# Patient Record
Sex: Female | Born: 1955 | Race: Black or African American | Hispanic: No | Smoking: Never smoker
Health system: Southern US, Community
[De-identification: ages and names within clinical notes are randomized; demographics above are authoritative.]

## PROBLEM LIST (undated history)

## (undated) DIAGNOSIS — F32A Depression, unspecified: Secondary | ICD-10-CM

## (undated) DIAGNOSIS — I878 Other specified disorders of veins: Secondary | ICD-10-CM

## (undated) DIAGNOSIS — F329 Major depressive disorder, single episode, unspecified: Secondary | ICD-10-CM

## (undated) DIAGNOSIS — N182 Chronic kidney disease, stage 2 (mild): Secondary | ICD-10-CM

## (undated) DIAGNOSIS — Z5189 Encounter for other specified aftercare: Secondary | ICD-10-CM

## (undated) DIAGNOSIS — K5731 Diverticulosis of large intestine without perforation or abscess with bleeding: Secondary | ICD-10-CM

## (undated) DIAGNOSIS — I639 Cerebral infarction, unspecified: Secondary | ICD-10-CM

## (undated) DIAGNOSIS — I739 Peripheral vascular disease, unspecified: Secondary | ICD-10-CM

## (undated) DIAGNOSIS — K589 Irritable bowel syndrome without diarrhea: Secondary | ICD-10-CM

## (undated) DIAGNOSIS — D649 Anemia, unspecified: Secondary | ICD-10-CM

## (undated) DIAGNOSIS — K2970 Gastritis, unspecified, without bleeding: Secondary | ICD-10-CM

## (undated) DIAGNOSIS — Z860100 Personal history of colon polyps, unspecified: Secondary | ICD-10-CM

## (undated) DIAGNOSIS — Z8719 Personal history of other diseases of the digestive system: Secondary | ICD-10-CM

## (undated) DIAGNOSIS — B9681 Helicobacter pylori [H. pylori] as the cause of diseases classified elsewhere: Secondary | ICD-10-CM

## (undated) DIAGNOSIS — D509 Iron deficiency anemia, unspecified: Secondary | ICD-10-CM

## (undated) DIAGNOSIS — I10 Essential (primary) hypertension: Secondary | ICD-10-CM

## (undated) DIAGNOSIS — E119 Type 2 diabetes mellitus without complications: Secondary | ICD-10-CM

## (undated) DIAGNOSIS — Z87440 Personal history of urinary (tract) infections: Secondary | ICD-10-CM

## (undated) DIAGNOSIS — I2510 Atherosclerotic heart disease of native coronary artery without angina pectoris: Secondary | ICD-10-CM

## (undated) DIAGNOSIS — I509 Heart failure, unspecified: Secondary | ICD-10-CM

## (undated) HISTORY — DX: Essential (primary) hypertension: I10

## (undated) HISTORY — PX: NEPHRECTOMY: SHX65

## (undated) HISTORY — PX: TUBAL LIGATION: SHX77

## (undated) HISTORY — PX: KNEE ARTHROSCOPY: SHX127

## (undated) HISTORY — PX: ABDOMINAL HYSTERECTOMY: SHX81

## (undated) HISTORY — PX: ABDOMINAL HYSTERECTOMY: SUR658

## (undated) HISTORY — DX: Anemia, unspecified: D64.9

## (undated) HISTORY — DX: Irritable bowel syndrome without diarrhea: K58.9

## (undated) HISTORY — PX: CHOLECYSTECTOMY: SHX55

## (undated) HISTORY — DX: Personal history of colonic polyps: Z86.010

## (undated) HISTORY — DX: Helicobacter pylori (H. pylori) as the cause of diseases classified elsewhere: B96.81

## (undated) HISTORY — DX: Cerebral infarction, unspecified: I63.9

## (undated) HISTORY — PX: HAND SURGERY: SHX662

## (undated) HISTORY — DX: Encounter for other specified aftercare: Z51.89

## (undated) HISTORY — DX: Personal history of colon polyps, unspecified: Z86.0100

## (undated) HISTORY — DX: Iron deficiency anemia, unspecified: D50.9

## (undated) HISTORY — DX: Diverticulosis of large intestine without perforation or abscess with bleeding: K57.31

## (undated) HISTORY — DX: Gastritis, unspecified, without bleeding: K29.70

---

## 1978-11-14 HISTORY — PX: TUBAL LIGATION: SHX77

## 1998-11-18 ENCOUNTER — Other Ambulatory Visit: Admission: RE | Admit: 1998-11-18 | Discharge: 1998-11-18 | Payer: Self-pay | Admitting: Obstetrics & Gynecology

## 1999-07-12 ENCOUNTER — Encounter (INDEPENDENT_AMBULATORY_CARE_PROVIDER_SITE_OTHER): Payer: Self-pay | Admitting: Specialist

## 1999-07-12 ENCOUNTER — Other Ambulatory Visit: Admission: RE | Admit: 1999-07-12 | Discharge: 1999-07-12 | Payer: Self-pay | Admitting: Obstetrics and Gynecology

## 1999-07-30 ENCOUNTER — Encounter: Payer: Self-pay | Admitting: Obstetrics and Gynecology

## 1999-08-04 ENCOUNTER — Inpatient Hospital Stay (HOSPITAL_COMMUNITY): Admission: RE | Admit: 1999-08-04 | Discharge: 1999-08-07 | Payer: Self-pay | Admitting: Obstetrics and Gynecology

## 1999-08-04 ENCOUNTER — Encounter (INDEPENDENT_AMBULATORY_CARE_PROVIDER_SITE_OTHER): Payer: Self-pay | Admitting: Specialist

## 1999-08-17 ENCOUNTER — Encounter: Payer: Self-pay | Admitting: Obstetrics and Gynecology

## 1999-08-17 ENCOUNTER — Ambulatory Visit (HOSPITAL_COMMUNITY): Admission: RE | Admit: 1999-08-17 | Discharge: 1999-08-17 | Payer: Self-pay | Admitting: Obstetrics and Gynecology

## 2000-02-18 ENCOUNTER — Other Ambulatory Visit: Admission: RE | Admit: 2000-02-18 | Discharge: 2000-02-18 | Payer: Self-pay | Admitting: Obstetrics and Gynecology

## 2001-08-30 ENCOUNTER — Encounter: Admission: RE | Admit: 2001-08-30 | Discharge: 2001-10-26 | Payer: Self-pay | Admitting: Orthopedic Surgery

## 2002-05-09 ENCOUNTER — Other Ambulatory Visit: Admission: RE | Admit: 2002-05-09 | Discharge: 2002-05-09 | Payer: Self-pay | Admitting: Obstetrics and Gynecology

## 2002-10-14 ENCOUNTER — Ambulatory Visit (HOSPITAL_COMMUNITY): Admission: RE | Admit: 2002-10-14 | Discharge: 2002-10-14 | Payer: Self-pay | Admitting: Internal Medicine

## 2002-10-14 ENCOUNTER — Encounter: Payer: Self-pay | Admitting: Internal Medicine

## 2002-10-24 ENCOUNTER — Ambulatory Visit (HOSPITAL_COMMUNITY): Admission: RE | Admit: 2002-10-24 | Discharge: 2002-10-24 | Payer: Self-pay | Admitting: Internal Medicine

## 2002-10-24 ENCOUNTER — Encounter: Payer: Self-pay | Admitting: Internal Medicine

## 2002-11-14 HISTORY — PX: ABDOMINAL HYSTERECTOMY: SHX81

## 2002-12-12 ENCOUNTER — Encounter: Payer: Self-pay | Admitting: Urology

## 2002-12-16 ENCOUNTER — Encounter (INDEPENDENT_AMBULATORY_CARE_PROVIDER_SITE_OTHER): Payer: Self-pay

## 2002-12-16 ENCOUNTER — Inpatient Hospital Stay (HOSPITAL_COMMUNITY): Admission: RE | Admit: 2002-12-16 | Discharge: 2002-12-19 | Payer: Self-pay | Admitting: Urology

## 2003-01-17 ENCOUNTER — Encounter: Payer: Self-pay | Admitting: Internal Medicine

## 2003-01-20 ENCOUNTER — Encounter: Payer: Self-pay | Admitting: Internal Medicine

## 2003-01-20 ENCOUNTER — Ambulatory Visit (HOSPITAL_COMMUNITY): Admission: RE | Admit: 2003-01-20 | Discharge: 2003-01-20 | Payer: Self-pay | Admitting: Internal Medicine

## 2003-05-12 ENCOUNTER — Other Ambulatory Visit: Admission: RE | Admit: 2003-05-12 | Discharge: 2003-05-12 | Payer: Self-pay | Admitting: Obstetrics and Gynecology

## 2004-01-02 ENCOUNTER — Ambulatory Visit (HOSPITAL_COMMUNITY): Admission: RE | Admit: 2004-01-02 | Discharge: 2004-01-02 | Payer: Self-pay | Admitting: Urology

## 2004-07-15 ENCOUNTER — Ambulatory Visit (HOSPITAL_COMMUNITY): Admission: RE | Admit: 2004-07-15 | Discharge: 2004-07-15 | Payer: Self-pay | Admitting: Urology

## 2004-07-16 ENCOUNTER — Ambulatory Visit (HOSPITAL_BASED_OUTPATIENT_CLINIC_OR_DEPARTMENT_OTHER): Admission: RE | Admit: 2004-07-16 | Discharge: 2004-07-16 | Payer: Self-pay | Admitting: Orthopedic Surgery

## 2004-07-16 ENCOUNTER — Ambulatory Visit (HOSPITAL_COMMUNITY): Admission: RE | Admit: 2004-07-16 | Discharge: 2004-07-16 | Payer: Self-pay | Admitting: Orthopedic Surgery

## 2004-11-22 ENCOUNTER — Emergency Department (HOSPITAL_COMMUNITY): Admission: EM | Admit: 2004-11-22 | Discharge: 2004-11-22 | Payer: Self-pay | Admitting: Emergency Medicine

## 2004-12-14 ENCOUNTER — Encounter: Admission: RE | Admit: 2004-12-14 | Discharge: 2005-03-14 | Payer: Self-pay | Admitting: Family Medicine

## 2005-01-14 ENCOUNTER — Encounter: Admission: RE | Admit: 2005-01-14 | Discharge: 2005-01-14 | Payer: Self-pay | Admitting: Urology

## 2005-08-05 ENCOUNTER — Ambulatory Visit (HOSPITAL_COMMUNITY): Admission: RE | Admit: 2005-08-05 | Discharge: 2005-08-05 | Payer: Self-pay | Admitting: Urology

## 2005-11-06 ENCOUNTER — Observation Stay (HOSPITAL_COMMUNITY): Admission: EM | Admit: 2005-11-06 | Discharge: 2005-11-06 | Payer: Self-pay | Admitting: Emergency Medicine

## 2005-12-15 ENCOUNTER — Inpatient Hospital Stay (HOSPITAL_COMMUNITY): Admission: RE | Admit: 2005-12-15 | Discharge: 2005-12-17 | Payer: Self-pay | Admitting: Surgery

## 2005-12-15 ENCOUNTER — Encounter (INDEPENDENT_AMBULATORY_CARE_PROVIDER_SITE_OTHER): Payer: Self-pay | Admitting: Specialist

## 2006-02-27 ENCOUNTER — Ambulatory Visit: Payer: Self-pay | Admitting: Internal Medicine

## 2006-03-06 ENCOUNTER — Ambulatory Visit: Payer: Self-pay | Admitting: Internal Medicine

## 2006-03-13 ENCOUNTER — Ambulatory Visit: Payer: Self-pay | Admitting: Internal Medicine

## 2006-03-24 ENCOUNTER — Encounter: Payer: Self-pay | Admitting: Internal Medicine

## 2006-05-09 ENCOUNTER — Ambulatory Visit: Payer: Self-pay | Admitting: Internal Medicine

## 2006-06-29 ENCOUNTER — Ambulatory Visit (HOSPITAL_BASED_OUTPATIENT_CLINIC_OR_DEPARTMENT_OTHER): Admission: RE | Admit: 2006-06-29 | Discharge: 2006-06-30 | Payer: Self-pay | Admitting: Orthopedic Surgery

## 2007-05-24 ENCOUNTER — Emergency Department (HOSPITAL_COMMUNITY): Admission: EM | Admit: 2007-05-24 | Discharge: 2007-05-24 | Payer: Self-pay | Admitting: Emergency Medicine

## 2007-05-25 ENCOUNTER — Ambulatory Visit (HOSPITAL_COMMUNITY): Admission: RE | Admit: 2007-05-25 | Discharge: 2007-05-25 | Payer: Self-pay | Admitting: Urology

## 2007-07-03 ENCOUNTER — Ambulatory Visit (HOSPITAL_BASED_OUTPATIENT_CLINIC_OR_DEPARTMENT_OTHER): Admission: RE | Admit: 2007-07-03 | Discharge: 2007-07-03 | Payer: Self-pay | Admitting: Orthopedic Surgery

## 2007-12-28 ENCOUNTER — Ambulatory Visit (HOSPITAL_BASED_OUTPATIENT_CLINIC_OR_DEPARTMENT_OTHER): Admission: RE | Admit: 2007-12-28 | Discharge: 2007-12-28 | Payer: Self-pay | Admitting: Orthopedic Surgery

## 2008-05-16 ENCOUNTER — Ambulatory Visit (HOSPITAL_COMMUNITY): Admission: RE | Admit: 2008-05-16 | Discharge: 2008-05-16 | Payer: Self-pay | Admitting: Urology

## 2008-09-26 ENCOUNTER — Ambulatory Visit (HOSPITAL_COMMUNITY): Admission: RE | Admit: 2008-09-26 | Discharge: 2008-09-26 | Payer: Self-pay | Admitting: Family Medicine

## 2008-10-03 ENCOUNTER — Emergency Department (HOSPITAL_COMMUNITY): Admission: EM | Admit: 2008-10-03 | Discharge: 2008-10-03 | Payer: Self-pay | Admitting: Emergency Medicine

## 2008-11-14 DIAGNOSIS — Z5189 Encounter for other specified aftercare: Secondary | ICD-10-CM

## 2008-11-14 DIAGNOSIS — IMO0001 Reserved for inherently not codable concepts without codable children: Secondary | ICD-10-CM

## 2008-11-14 HISTORY — DX: Encounter for other specified aftercare: Z51.89

## 2008-11-14 HISTORY — DX: Reserved for inherently not codable concepts without codable children: IMO0001

## 2008-11-30 ENCOUNTER — Ambulatory Visit: Payer: Self-pay | Admitting: Gastroenterology

## 2008-12-01 ENCOUNTER — Inpatient Hospital Stay (HOSPITAL_COMMUNITY): Admission: EM | Admit: 2008-12-01 | Discharge: 2008-12-05 | Payer: Self-pay | Admitting: Emergency Medicine

## 2008-12-01 ENCOUNTER — Telehealth: Payer: Self-pay | Admitting: Internal Medicine

## 2008-12-01 ENCOUNTER — Encounter: Payer: Self-pay | Admitting: Gastroenterology

## 2008-12-04 ENCOUNTER — Encounter: Payer: Self-pay | Admitting: Internal Medicine

## 2008-12-07 ENCOUNTER — Encounter: Payer: Self-pay | Admitting: Internal Medicine

## 2008-12-12 ENCOUNTER — Ambulatory Visit: Payer: Self-pay | Admitting: Gastroenterology

## 2008-12-12 ENCOUNTER — Ambulatory Visit: Payer: Self-pay | Admitting: Internal Medicine

## 2008-12-12 DIAGNOSIS — Z86010 Personal history of colonic polyps: Secondary | ICD-10-CM | POA: Insufficient documentation

## 2008-12-12 HISTORY — PX: UPPER GASTROINTESTINAL ENDOSCOPY: SHX188

## 2008-12-12 HISTORY — PX: FLEXIBLE SIGMOIDOSCOPY: SHX1649

## 2008-12-12 LAB — CONVERTED CEMR LAB
ALT: 20 units/L (ref 0–35)
Alkaline Phosphatase: 71 units/L (ref 39–117)
Basophils Absolute: 0 10*3/uL (ref 0.0–0.1)
Creatinine, Ser: 1.1 mg/dL (ref 0.4–1.2)
Eosinophils Absolute: 0.2 10*3/uL (ref 0.0–0.7)
HCT: 25.1 % — ABNORMAL LOW (ref 36.0–46.0)
Hemoglobin: 8.2 g/dL — ABNORMAL LOW (ref 12.0–15.0)
MCHC: 32.8 g/dL (ref 30.0–36.0)
MCV: 90.5 fL (ref 78.0–100.0)
Monocytes Absolute: 0.3 10*3/uL (ref 0.1–1.0)
Neutro Abs: 10.7 10*3/uL — ABNORMAL HIGH (ref 1.4–7.7)
RDW: 15.6 % — ABNORMAL HIGH (ref 11.5–14.6)
Sodium: 142 meq/L (ref 135–145)
Total Bilirubin: 0.5 mg/dL (ref 0.3–1.2)
Total Protein: 6.8 g/dL (ref 6.0–8.3)

## 2008-12-15 ENCOUNTER — Inpatient Hospital Stay (HOSPITAL_COMMUNITY): Admission: EM | Admit: 2008-12-15 | Discharge: 2008-12-23 | Payer: Self-pay | Admitting: Emergency Medicine

## 2008-12-15 ENCOUNTER — Telehealth: Payer: Self-pay | Admitting: Gastroenterology

## 2008-12-16 ENCOUNTER — Encounter: Payer: Self-pay | Admitting: Gastroenterology

## 2008-12-17 ENCOUNTER — Encounter: Payer: Self-pay | Admitting: Gastroenterology

## 2008-12-17 ENCOUNTER — Encounter (INDEPENDENT_AMBULATORY_CARE_PROVIDER_SITE_OTHER): Payer: Self-pay | Admitting: *Deleted

## 2008-12-19 ENCOUNTER — Encounter: Payer: Self-pay | Admitting: Gastroenterology

## 2008-12-19 HISTORY — PX: COLONOSCOPY: SHX5424

## 2008-12-22 ENCOUNTER — Ambulatory Visit: Payer: Self-pay | Admitting: Gastroenterology

## 2008-12-30 ENCOUNTER — Ambulatory Visit: Payer: Self-pay | Admitting: Gastroenterology

## 2008-12-30 ENCOUNTER — Ambulatory Visit: Payer: Self-pay | Admitting: Internal Medicine

## 2008-12-30 LAB — CONVERTED CEMR LAB
HCT: 34.1 % — ABNORMAL LOW (ref 36.0–46.0)
Hemoglobin: 11.5 g/dL — ABNORMAL LOW (ref 12.0–15.0)

## 2009-04-03 ENCOUNTER — Ambulatory Visit (HOSPITAL_BASED_OUTPATIENT_CLINIC_OR_DEPARTMENT_OTHER): Admission: RE | Admit: 2009-04-03 | Discharge: 2009-04-03 | Payer: Self-pay | Admitting: Orthopedic Surgery

## 2009-06-22 ENCOUNTER — Telehealth: Payer: Self-pay | Admitting: Internal Medicine

## 2009-06-23 ENCOUNTER — Ambulatory Visit: Payer: Self-pay | Admitting: Internal Medicine

## 2009-06-23 DIAGNOSIS — I152 Hypertension secondary to endocrine disorders: Secondary | ICD-10-CM | POA: Insufficient documentation

## 2009-06-23 DIAGNOSIS — I10 Essential (primary) hypertension: Secondary | ICD-10-CM

## 2009-06-25 LAB — CONVERTED CEMR LAB
Basophils Absolute: 0 10*3/uL (ref 0.0–0.1)
Eosinophils Relative: 1.9 % (ref 0.0–5.0)
HCT: 37.3 % (ref 36.0–46.0)
Hemoglobin: 12.3 g/dL (ref 12.0–15.0)
Lymphocytes Relative: 30.1 % (ref 12.0–46.0)
Lymphs Abs: 2.3 10*3/uL (ref 0.7–4.0)
Monocytes Relative: 4.8 % (ref 3.0–12.0)
Neutro Abs: 4.9 10*3/uL (ref 1.4–7.7)
Platelets: 198 10*3/uL (ref 150.0–400.0)
WBC: 7.7 10*3/uL (ref 4.5–10.5)

## 2009-06-26 ENCOUNTER — Telehealth: Payer: Self-pay | Admitting: Physician Assistant

## 2009-07-14 ENCOUNTER — Telehealth: Payer: Self-pay | Admitting: Physician Assistant

## 2009-07-15 ENCOUNTER — Ambulatory Visit: Payer: Self-pay | Admitting: Internal Medicine

## 2009-07-15 LAB — CONVERTED CEMR LAB
BUN: 18 mg/dL (ref 6–23)
CO2: 31 meq/L (ref 19–32)
Calcium: 8.8 mg/dL (ref 8.4–10.5)
Chloride: 105 meq/L (ref 96–112)
Creatinine, Ser: 0.9 mg/dL (ref 0.4–1.2)
Eosinophils Relative: 1.9 % (ref 0.0–5.0)
Glucose, Bld: 129 mg/dL — ABNORMAL HIGH (ref 70–99)
HCT: 38.9 % (ref 36.0–46.0)
Hemoglobin: 12.8 g/dL (ref 12.0–15.0)
Lymphs Abs: 2.1 10*3/uL (ref 0.7–4.0)
MCV: 85.1 fL (ref 78.0–100.0)
Monocytes Absolute: 0.3 10*3/uL (ref 0.1–1.0)
Monocytes Relative: 4.5 % (ref 3.0–12.0)
Neutro Abs: 4.7 10*3/uL (ref 1.4–7.7)
Platelets: 202 10*3/uL (ref 150.0–400.0)
WBC: 7.3 10*3/uL (ref 4.5–10.5)

## 2009-07-16 ENCOUNTER — Telehealth (INDEPENDENT_AMBULATORY_CARE_PROVIDER_SITE_OTHER): Payer: Self-pay | Admitting: *Deleted

## 2009-07-28 ENCOUNTER — Ambulatory Visit: Payer: Self-pay | Admitting: Internal Medicine

## 2009-07-28 DIAGNOSIS — M5430 Sciatica, unspecified side: Secondary | ICD-10-CM | POA: Insufficient documentation

## 2009-07-28 DIAGNOSIS — R319 Hematuria, unspecified: Secondary | ICD-10-CM | POA: Insufficient documentation

## 2009-07-29 LAB — CONVERTED CEMR LAB
Bilirubin Urine: NEGATIVE
Ketones, ur: NEGATIVE mg/dL
Nitrite: NEGATIVE
Specific Gravity, Urine: 1.025
Total Protein, Urine: 100 mg/dL
Urine Glucose: NEGATIVE mg/dL
Urobilinogen, UA: 0.2
pH: 6

## 2009-09-15 ENCOUNTER — Encounter: Payer: Self-pay | Admitting: Internal Medicine

## 2009-09-16 ENCOUNTER — Ambulatory Visit: Payer: Self-pay | Admitting: Internal Medicine

## 2010-01-05 ENCOUNTER — Inpatient Hospital Stay (HOSPITAL_COMMUNITY): Admission: EM | Admit: 2010-01-05 | Discharge: 2010-01-08 | Payer: Self-pay | Admitting: Emergency Medicine

## 2010-01-05 ENCOUNTER — Ambulatory Visit: Payer: Self-pay | Admitting: Internal Medicine

## 2010-01-08 ENCOUNTER — Encounter (INDEPENDENT_AMBULATORY_CARE_PROVIDER_SITE_OTHER): Payer: Self-pay | Admitting: *Deleted

## 2010-02-20 ENCOUNTER — Emergency Department (HOSPITAL_BASED_OUTPATIENT_CLINIC_OR_DEPARTMENT_OTHER): Admission: EM | Admit: 2010-02-20 | Discharge: 2010-02-20 | Payer: Self-pay | Admitting: Emergency Medicine

## 2010-02-20 ENCOUNTER — Ambulatory Visit: Payer: Self-pay | Admitting: Diagnostic Radiology

## 2010-04-24 ENCOUNTER — Encounter: Admission: RE | Admit: 2010-04-24 | Discharge: 2010-04-24 | Payer: Self-pay | Admitting: Specialist

## 2010-11-12 ENCOUNTER — Ambulatory Visit: Payer: Self-pay | Admitting: Internal Medicine

## 2010-12-16 NOTE — Consult Note (Signed)
Summary: GI Bleed  NAME:  [name], [name]          ACCOUNT NO.:  [id number]      MEDICAL RECORD NO.:  [id number]          PATIENT TYPE:  INP      LOCATION:  1521                         FACILITY:  [facility]      PHYSICIAN:  Almond Lint, MD       DATE OF BIRTH:  Nov 16, 1955      DATE OF CONSULTATION:  12/17/2008   DATE OF DISCHARGE:                                    CONSULTATION      CHIEF COMPLAINT:  Gastrointestinal bleed.      HISTORY OF PRESENT ILLNESS:  [name] is a very pleasant a 55-year-   old African American female on her second admission in the last 2-1/2   weeks for GI bleed.  She has had a total of 6 units of red blood cells.   She last time was admitted and had a colonoscopy which demonstrated   diverticulosis and some adenomatous sigmoid polyps.  She went home but   came back a week later to clinic with around 6 black stools.  She   dropped her hemoglobin a point and a half.  She had a repeat   sigmoidoscopy in the office that demonstrated just old blood.  EGD was   negative.  She came back to the ER several days ago with again 6 bloody   stools.  She felt lightheaded at that time.  While in the hospital this   admission she has had a bleeding scan which was positive for right upper   quadrant localization.  She underwent an angiogram today which was   unable to localize and embolize any bleeding.  She has had a stable   hematocrit today but it is of 23 and is going to get blood products for   that.  She currently feels well.      PAST MEDICAL HISTORY:  Hypertension, diabetes, morbid obesity, and   history of carpal tunnel syndrome.      PAST SURGICAL HISTORY:  Cholecystectomy by Dr. Luisa Hart in 2007 and a   renal cell carcinoma, status post left nephrectomy done laparoscopically   in 2004.      FAMILY HISTORY:  No colorectal cancer or liver disease.      SOCIAL HISTORY:  She denies smoking and does not use alcohol.      MEDICATIONS:   1. Glyburide10 mg  daily.   2. Altace 10 mg daily.   3. Janumet 50/500 twice a day.      ALLERGIES:  None.      REVIEW OF SYSTEMS:  Negative except for the HPI.  Of note, she was short   of breath when she came in with all bleeding.      PHYSICAL EXAMINATION:  VITAL SIGNS:  Temperature 98.2, pulse 94, blood   pressure 112/76, respiratory rate 18, one stool today this morning which   was dark.   GENERAL:  On examination she is alert and oriented x3 in no acute   distress.   HEENT:  Normocephalic, atraumatic.   NECK:  Supple.   SKIN.  Without  rashes.   MOOD AND AFFECT:  Normal.   ABDOMEN:  Soft, nontender and nondistended.   EXTREMITIES.  Warm, well perfused with a trace of edema.      LABORATORY DATA:  Hematocrit today 23.  Studies described above.      ASSESSMENT:  [name] is a very pleasant 55 year old morbidly   obese female with slow GI bleed.  She may ultimately require surgical   intervention.  However, given her diabetes and size, she would be at   risk for having difficulty with a big operation.  If possible, it would   be ideal to localize the source of bleeding in the event that should she   continue to have bleeding that is uncontrolled, we would have a better   target.  The right upper quadrant localization does suggest that it is   hepatic flexure of the colon.  However in my experience, I have seen   several people with small bowel bleeds that localized to this area as   well.  She continues to have a slow ooze that is not extremely rapid, I   would ideally like to repeat colonoscopy that is after a good prep.  In   that regard we may be able to localize the lesion more to the hepatic   flexure.  If there is nothing seen I would recommend a capsule   endoscopy.  Then again is she has rapid bleeding,  I recommend repeating   the arteriogram to try to embolize.  She may ultimately end up requiring   a partial or total abdominal colectomy if she continues to bleed.  She    currently is not having hemodynamic instability and I think the floor is   appropriate level of care for her.               Almond Lint, MD   Electronically Signed            FB/MEDQ  D:  12/17/2008  T:  12/18/2008  Job:  [id number]

## 2010-12-16 NOTE — Discharge Summary (Signed)
Summary: Lower GI Bleed  NAME:  [name], HYNEMAN          ACCOUNT NO.:  [id number]      MEDICAL RECORD NO.:  [id number]          PATIENT TYPE:  INP      LOCATION:  1532                         [facility]ACILITY:  [facility]      PHYSICIAN:  Ruthy Dick, MD    DATE OF BIRTH:  1955-12-04      DATE OF ADMISSION:  01/05/2010   DATE OF DISCHARGE:  01/08/2010                                  DISCHARGE SUMMARY      REASON FOR ADMISSION:  Lower GI bleed.      PRIMARY CARE PHYSICIAN:  Dr. Merri Brunette.      FINAL DISCHARGE DIAGNOSES:   1. Lower gastrointestinal bleed likely due to diverticular bleed.   2. Acute blood loss anemia, stable at this point.   3. Hypertension.   4. Diabetes mellitus type 2, poorly controlled.   5. Obesity, exercise and diet recommended.   6. History of renal cell cancer status post left nephrectomy.   7. Tachycardia on presentation, resolved.      CONSULT DURING THIS ADMISSION:  Gastroenterology consult with [facility].      PROCEDURES DONE:  During this admission, chest x-ray on presentation   showed no acute cardiopulmonary disease.      BRIEF HISTORY OF PRESENTING ILLNESS AND HOSPITAL COURSE:  This is a   patient with a known history of previous GI diverticular bleed who came   into the hospital with dark stools which started the night of   presentation.  The patient had 4 episodes of what she __________  was   bright red and mixed with stool.  She was therefore admitted in the   hospital and hemoglobin was followed serially.  There was also   anticipation that the patient may end up requiring surgical consult if   bleeding cannot be controlled.  Luckily for her, for the past 2 days,   she has not anymore GI bleed.  Her hemoglobin and hematocrit have   remained relatively stable at around 10-9.8 serially.  She has also   remained symptom free at this time and no dizziness, no nausea and no   palpitations.  Because she was seen by GI as already noted above and    they had signed off from this case since yesterday recommending that the   patient continue normal bowel habits.  Today the patient has no new   issues.  No chest pain, no shortness of breath, no abdominal pain, no   nausea, no vomiting, no diarrhea, no constipation, no dysuria, no   frequency, no syncope.  She has yet to have another bowel movement   today, but she has been advised to go home and to watch her next 2 bowel   movements.  If for any reason, she has a little blood tinge in her stool   she may not have to panic but if she has any frank blood and __________   kind of bleeding then she will need to go to the nearest emergency room.   This has been discussed in detail  with her and she is in full agreement.      DISCHARGE MEDICATIONS:   1. Altace 10 mg b.i.d.   2. Glimepiride 4 mg daily.   3. Hyoscyamine 0.375 mg 1 tablet b.i.d.   4. The patient mentions the fact that she also takes Janumet but we do       not have this as one of her discharge medications.  She is to       discuss this with her primary care physician and continue taking if       the primary care physician wants to continue this medication.  In       any case, we did notice that sugar is not too well controlled as       her hemoglobin A1c here was about 10.2.  We have explained to her       go to her primary care physician to have this checked properly.       She is to follow with Dr. Merri Brunette in about 3-5 days and she       is to call to make this appointment.  She understands this.  We       have also advised her with diet and exercise as far as her obesity       is concerned.  CBC is to be obtained during her visit to her       primary care physician Dr. Merri Brunette.      VITAL SIGNS:  Temperature is 98.1, pulse 76, respiration 22, blood   pressure 155/76, saturating 98% room air.   CHEST:  Clear to auscultation bilateral.   ABDOMEN:  Soft, nontender.   EXTREMITIES:  There is no clubbing, no  cyanosis, no edema.   CARDIOVASCULAR:  Some first and second heart sounds only.   CENTRAL NERVOUS SYSTEM:  Nonfocal.   GENERAL:  The patient is obese.      Plan of care discussed in detail with the patient and she is in full   agreement.      Total time used for discharge of this patient is 40 minutes.               Ruthy Dick, MD            GU/MEDQ  D:  01/08/2010  T:  01/08/2010  Job:  [id number]      cc:   Dario Guardian, M.D.   Fax: [phone]

## 2010-12-29 ENCOUNTER — Emergency Department (INDEPENDENT_AMBULATORY_CARE_PROVIDER_SITE_OTHER): Payer: Self-pay

## 2010-12-29 ENCOUNTER — Emergency Department (HOSPITAL_BASED_OUTPATIENT_CLINIC_OR_DEPARTMENT_OTHER)
Admission: EM | Admit: 2010-12-29 | Discharge: 2010-12-29 | Disposition: A | Discharge status: Home / Self Care | Payer: Self-pay | Attending: Emergency Medicine | Admitting: Emergency Medicine

## 2010-12-29 ENCOUNTER — Encounter (HOSPITAL_BASED_OUTPATIENT_CLINIC_OR_DEPARTMENT_OTHER): Payer: Self-pay | Admitting: Radiology

## 2010-12-29 DIAGNOSIS — K5730 Diverticulosis of large intestine without perforation or abscess without bleeding: Secondary | ICD-10-CM | POA: Insufficient documentation

## 2010-12-29 DIAGNOSIS — A599 Trichomoniasis, unspecified: Secondary | ICD-10-CM | POA: Insufficient documentation

## 2010-12-29 DIAGNOSIS — R1031 Right lower quadrant pain: Secondary | ICD-10-CM | POA: Insufficient documentation

## 2010-12-29 DIAGNOSIS — I10 Essential (primary) hypertension: Secondary | ICD-10-CM | POA: Insufficient documentation

## 2010-12-29 DIAGNOSIS — E119 Type 2 diabetes mellitus without complications: Secondary | ICD-10-CM | POA: Insufficient documentation

## 2010-12-29 DIAGNOSIS — K7689 Other specified diseases of liver: Secondary | ICD-10-CM | POA: Insufficient documentation

## 2010-12-29 DIAGNOSIS — Z79899 Other long term (current) drug therapy: Secondary | ICD-10-CM | POA: Insufficient documentation

## 2010-12-29 DIAGNOSIS — R112 Nausea with vomiting, unspecified: Secondary | ICD-10-CM

## 2010-12-29 LAB — DIFFERENTIAL
Basophils Absolute: 0 10*3/uL (ref 0.0–0.1)
Basophils Relative: 0 % (ref 0–1)
Eosinophils Relative: 2 % (ref 0–5)
Lymphocytes Relative: 39 % (ref 12–46)

## 2010-12-29 LAB — URINALYSIS, ROUTINE W REFLEX MICROSCOPIC
Bilirubin Urine: NEGATIVE
Ketones, ur: NEGATIVE mg/dL
Urine Glucose, Fasting: >1000 mg/dL — AB
pH: 6 (ref 5.0–8.0)

## 2010-12-29 LAB — COMPREHENSIVE METABOLIC PANEL
ALT: 32 U/L (ref 0–35)
AST: 22 U/L (ref 0–37)
Albumin: 3.8 g/dL (ref 3.5–5.2)
Alkaline Phosphatase: 160 U/L — ABNORMAL HIGH (ref 39–117)
GFR calc Af Amer: >60 mL/min (ref >60)
Glucose, Bld: 274 mg/dL — ABNORMAL HIGH (ref 70–99)
Potassium: 4.4 mEq/L (ref 3.5–5.1)
Sodium: 141 mEq/L (ref 135–145)
Total Protein: 8.2 g/dL (ref 6.0–8.3)

## 2010-12-29 LAB — CBC
HCT: 38.8 % (ref 36.0–46.0)
Platelets: 174 10*3/uL (ref 150–400)
RDW: 14.4 % (ref 11.5–15.5)
WBC: 8.4 10*3/uL (ref 4.0–10.5)

## 2010-12-29 LAB — URINE MICROSCOPIC-ADD ON

## 2010-12-29 MED ORDER — IOHEXOL 300 MG/ML  SOLN
100.0000 mL | Freq: Once | INTRAMUSCULAR | Status: AC | PRN
  Administered 2010-12-29: 100 mL via INTRAVENOUS

## 2011-02-02 LAB — CBC
HCT: 29.3 % — ABNORMAL LOW (ref 36.0–46.0)
HCT: 30.2 % — ABNORMAL LOW (ref 36.0–46.0)
Hemoglobin: 10.7 g/dL — ABNORMAL LOW (ref 12.0–15.0)
Hemoglobin: 9.6 g/dL — ABNORMAL LOW (ref 12.0–15.0)
MCHC: 32.8 g/dL (ref 30.0–36.0)
MCHC: 33.3 g/dL (ref 30.0–36.0)
MCHC: 33.4 g/dL (ref 30.0–36.0)
MCV: 86.4 fL (ref 78.0–100.0)
MCV: 86.7 fL (ref 78.0–100.0)
MCV: 87.1 fL (ref 78.0–100.0)
MCV: 87.5 fL (ref 78.0–100.0)
Platelets: 149 10*3/uL — ABNORMAL LOW (ref 150–400)
Platelets: 161 10*3/uL (ref 150–400)
Platelets: 169 10*3/uL (ref 150–400)
Platelets: 173 10*3/uL (ref 150–400)
Platelets: 206 10*3/uL (ref 150–400)
RBC: 3.35 MIL/uL — ABNORMAL LOW (ref 3.87–5.11)
RBC: 3.48 MIL/uL — ABNORMAL LOW (ref 3.87–5.11)
RBC: 3.54 MIL/uL — ABNORMAL LOW (ref 3.87–5.11)
RBC: 3.75 MIL/uL — ABNORMAL LOW (ref 3.87–5.11)
RBC: 4.42 MIL/uL (ref 3.87–5.11)
RDW: 15.3 % (ref 11.5–15.5)
WBC: 10 10*3/uL (ref 4.0–10.5)
WBC: 7.6 10*3/uL (ref 4.0–10.5)
WBC: 8.3 10*3/uL (ref 4.0–10.5)
WBC: 8.5 10*3/uL (ref 4.0–10.5)
WBC: 9.2 10*3/uL (ref 4.0–10.5)
WBC: 9.7 10*3/uL (ref 4.0–10.5)

## 2011-02-02 LAB — CARDIAC PANEL(CRET KIN+CKTOT+MB+TROPI)
CK, MB: 1.7 ng/mL (ref 0.3–4.0)
Total CK: 138 U/L (ref 7–177)
Total CK: 152 U/L (ref 7–177)

## 2011-02-02 LAB — URINALYSIS, ROUTINE W REFLEX MICROSCOPIC
Glucose, UA: NEGATIVE mg/dL
Specific Gravity, Urine: 1.013 (ref 1.005–1.030)

## 2011-02-02 LAB — MRSA CULTURE

## 2011-02-02 LAB — GLUCOSE, CAPILLARY
Glucose-Capillary: 114 mg/dL — ABNORMAL HIGH (ref 70–99)
Glucose-Capillary: 158 mg/dL — ABNORMAL HIGH (ref 70–99)
Glucose-Capillary: 169 mg/dL — ABNORMAL HIGH (ref 70–99)
Glucose-Capillary: 304 mg/dL — ABNORMAL HIGH (ref 70–99)
Glucose-Capillary: 81 mg/dL (ref 70–99)

## 2011-02-02 LAB — URINE MICROSCOPIC-ADD ON

## 2011-02-02 LAB — BASIC METABOLIC PANEL
CO2: 23 mEq/L (ref 19–32)
Chloride: 99 mEq/L (ref 96–112)
GFR calc Af Amer: >60 mL/min (ref >60)
Potassium: 5 mEq/L (ref 3.5–5.1)
Sodium: 133 mEq/L — ABNORMAL LOW (ref 135–145)

## 2011-02-02 LAB — CROSSMATCH

## 2011-02-02 LAB — COMPREHENSIVE METABOLIC PANEL
BUN: 21 mg/dL (ref 6–23)
CO2: 27 mEq/L (ref 19–32)
Calcium: 8.4 mg/dL (ref 8.4–10.5)
Chloride: 108 mEq/L (ref 96–112)
Creatinine, Ser: 1.03 mg/dL (ref 0.4–1.2)
GFR calc non Af Amer: 56 mL/min — ABNORMAL LOW (ref >60)
Glucose, Bld: 266 mg/dL — ABNORMAL HIGH (ref 70–99)
Total Bilirubin: 0.5 mg/dL (ref 0.3–1.2)

## 2011-02-02 LAB — PROTIME-INR
INR: 0.96 (ref 0.00–1.49)
Prothrombin Time: 12.7 seconds (ref 11.6–15.2)

## 2011-02-02 LAB — DIFFERENTIAL
Basophils Absolute: 0.1 10*3/uL (ref 0.0–0.1)
Basophils Relative: 1 % (ref 0–1)
Eosinophils Absolute: 0.1 10*3/uL (ref 0.0–0.7)
Monocytes Relative: 5 % (ref 3–12)
Neutro Abs: 6.6 10*3/uL (ref 1.7–7.7)
Neutrophils Relative %: 62 % (ref 43–77)

## 2011-02-02 LAB — TSH: TSH: 1.316 u[IU]/mL (ref 0.350–4.500)

## 2011-02-02 LAB — APTT: aPTT: 29 seconds (ref 24–37)

## 2011-02-02 LAB — HEMOGLOBIN A1C: Mean Plasma Glucose: 246 mg/dL

## 2011-02-02 LAB — BRAIN NATRIURETIC PEPTIDE: Pro B Natriuretic peptide (BNP): <30 pg/mL (ref 0.0–100.0)

## 2011-02-22 LAB — BASIC METABOLIC PANEL
CO2: 27 mEq/L (ref 19–32)
Chloride: 107 mEq/L (ref 96–112)
GFR calc Af Amer: >60 mL/min (ref >60)
Sodium: 138 mEq/L (ref 135–145)

## 2011-02-22 LAB — GLUCOSE, CAPILLARY
Glucose-Capillary: 124 mg/dL — ABNORMAL HIGH (ref 70–99)
Glucose-Capillary: 87 mg/dL (ref 70–99)

## 2011-02-28 LAB — DIFFERENTIAL
Basophils Relative: 0 % (ref 0–1)
Eosinophils Absolute: 0.1 10*3/uL (ref 0.0–0.7)
Monocytes Relative: 3 % (ref 3–12)
Neutrophils Relative %: 71 % (ref 43–77)

## 2011-02-28 LAB — TYPE AND SCREEN

## 2011-02-28 LAB — GLUCOSE, CAPILLARY
Glucose-Capillary: 108 mg/dL — ABNORMAL HIGH (ref 70–99)
Glucose-Capillary: 112 mg/dL — ABNORMAL HIGH (ref 70–99)
Glucose-Capillary: 116 mg/dL — ABNORMAL HIGH (ref 70–99)
Glucose-Capillary: 117 mg/dL — ABNORMAL HIGH (ref 70–99)
Glucose-Capillary: 123 mg/dL — ABNORMAL HIGH (ref 70–99)
Glucose-Capillary: 140 mg/dL — ABNORMAL HIGH (ref 70–99)
Glucose-Capillary: 171 mg/dL — ABNORMAL HIGH (ref 70–99)
Glucose-Capillary: 81 mg/dL (ref 70–99)
Glucose-Capillary: 94 mg/dL (ref 70–99)

## 2011-02-28 LAB — CBC
HCT: 24.4 % — ABNORMAL LOW (ref 36.0–46.0)
Hemoglobin: 8.1 g/dL — ABNORMAL LOW (ref 12.0–15.0)
Hemoglobin: 9.1 g/dL — ABNORMAL LOW (ref 12.0–15.0)
MCHC: 33.3 g/dL (ref 30.0–36.0)
RBC: 3.25 MIL/uL — ABNORMAL LOW (ref 3.87–5.11)
RDW: 14.6 % (ref 11.5–15.5)
WBC: 10.8 10*3/uL — ABNORMAL HIGH (ref 4.0–10.5)

## 2011-02-28 LAB — COMPREHENSIVE METABOLIC PANEL
ALT: 16 U/L (ref 0–35)
Alkaline Phosphatase: 71 U/L (ref 39–117)
CO2: 27 mEq/L (ref 19–32)
GFR calc non Af Amer: 56 mL/min — ABNORMAL LOW (ref >60)
Glucose, Bld: 82 mg/dL (ref 70–99)
Potassium: 4.1 mEq/L (ref 3.5–5.1)
Sodium: 142 mEq/L (ref 135–145)
Total Protein: 6.2 g/dL (ref 6.0–8.3)

## 2011-02-28 LAB — HEMOGLOBIN AND HEMATOCRIT, BLOOD
HCT: 23.2 % — ABNORMAL LOW (ref 36.0–46.0)
HCT: 25.7 % — ABNORMAL LOW (ref 36.0–46.0)
HCT: 29.2 % — ABNORMAL LOW (ref 36.0–46.0)
HCT: 31.1 % — ABNORMAL LOW (ref 36.0–46.0)
HCT: 32.3 % — ABNORMAL LOW (ref 36.0–46.0)
HCT: 32.3 % — ABNORMAL LOW (ref 36.0–46.0)
HCT: 33.5 % — ABNORMAL LOW (ref 36.0–46.0)
Hemoglobin: 10.3 g/dL — ABNORMAL LOW (ref 12.0–15.0)
Hemoglobin: 10.8 g/dL — ABNORMAL LOW (ref 12.0–15.0)
Hemoglobin: 8.7 g/dL — ABNORMAL LOW (ref 12.0–15.0)
Hemoglobin: 9.7 g/dL — ABNORMAL LOW (ref 12.0–15.0)

## 2011-02-28 LAB — BASIC METABOLIC PANEL
CO2: 23 mEq/L (ref 19–32)
Chloride: 109 mEq/L (ref 96–112)
Glucose, Bld: 149 mg/dL — ABNORMAL HIGH (ref 70–99)
Potassium: 4.1 mEq/L (ref 3.5–5.1)
Sodium: 138 mEq/L (ref 135–145)

## 2011-02-28 LAB — PROTIME-INR: INR: 1 (ref 0.00–1.49)

## 2011-03-01 LAB — GLUCOSE, CAPILLARY
Glucose-Capillary: 127 mg/dL — ABNORMAL HIGH (ref 70–99)
Glucose-Capillary: 135 mg/dL — ABNORMAL HIGH (ref 70–99)
Glucose-Capillary: 147 mg/dL — ABNORMAL HIGH (ref 70–99)
Glucose-Capillary: 157 mg/dL — ABNORMAL HIGH (ref 70–99)
Glucose-Capillary: 162 mg/dL — ABNORMAL HIGH (ref 70–99)
Glucose-Capillary: 209 mg/dL — ABNORMAL HIGH (ref 70–99)
Glucose-Capillary: 76 mg/dL (ref 70–99)
Glucose-Capillary: 83 mg/dL (ref 70–99)
Glucose-Capillary: 89 mg/dL (ref 70–99)

## 2011-03-01 LAB — CBC
HCT: 22.4 % — ABNORMAL LOW (ref 36.0–46.0)
HCT: 28.7 % — ABNORMAL LOW (ref 36.0–46.0)
Hemoglobin: 7.3 g/dL — CL (ref 12.0–15.0)
Hemoglobin: 9.9 g/dL — ABNORMAL LOW (ref 12.0–15.0)
MCV: 89.3 fL (ref 78.0–100.0)
MCV: 90.2 fL (ref 78.0–100.0)
MCV: 90.4 fL (ref 78.0–100.0)
Platelets: 285 10*3/uL (ref 150–400)
RBC: 2.48 MIL/uL — ABNORMAL LOW (ref 3.87–5.11)
RBC: 3.15 MIL/uL — ABNORMAL LOW (ref 3.87–5.11)
RBC: 3.22 MIL/uL — ABNORMAL LOW (ref 3.87–5.11)
WBC: 10 10*3/uL (ref 4.0–10.5)
WBC: 11.9 10*3/uL — ABNORMAL HIGH (ref 4.0–10.5)
WBC: 15.1 10*3/uL — ABNORMAL HIGH (ref 4.0–10.5)

## 2011-03-01 LAB — HEMOGLOBIN AND HEMATOCRIT, BLOOD
HCT: 21.9 % — ABNORMAL LOW (ref 36.0–46.0)
HCT: 23.7 % — ABNORMAL LOW (ref 36.0–46.0)
HCT: 24.7 % — ABNORMAL LOW (ref 36.0–46.0)
HCT: 26.1 % — ABNORMAL LOW (ref 36.0–46.0)
HCT: 28 % — ABNORMAL LOW (ref 36.0–46.0)
HCT: 31 % — ABNORMAL LOW (ref 36.0–46.0)
HCT: 31.1 % — ABNORMAL LOW (ref 36.0–46.0)
HCT: 32.2 % — ABNORMAL LOW (ref 36.0–46.0)
Hemoglobin: 10.4 g/dL — ABNORMAL LOW (ref 12.0–15.0)
Hemoglobin: 10.6 g/dL — ABNORMAL LOW (ref 12.0–15.0)
Hemoglobin: 10.7 g/dL — ABNORMAL LOW (ref 12.0–15.0)
Hemoglobin: 7.3 g/dL — CL (ref 12.0–15.0)
Hemoglobin: 7.7 g/dL — CL (ref 12.0–15.0)
Hemoglobin: 7.8 g/dL — CL (ref 12.0–15.0)
Hemoglobin: 8.1 g/dL — ABNORMAL LOW (ref 12.0–15.0)
Hemoglobin: 8.6 g/dL — ABNORMAL LOW (ref 12.0–15.0)
Hemoglobin: 9.1 g/dL — ABNORMAL LOW (ref 12.0–15.0)
Hemoglobin: 9.2 g/dL — ABNORMAL LOW (ref 12.0–15.0)
Hemoglobin: 9.4 g/dL — ABNORMAL LOW (ref 12.0–15.0)
Hemoglobin: 9.5 g/dL — ABNORMAL LOW (ref 12.0–15.0)
Hemoglobin: 9.8 g/dL — ABNORMAL LOW (ref 12.0–15.0)

## 2011-03-01 LAB — CROSSMATCH
ABO/RH(D): O POS
Antibody Screen: NEGATIVE

## 2011-03-01 LAB — BASIC METABOLIC PANEL
BUN: 12 mg/dL (ref 6–23)
BUN: 6 mg/dL (ref 6–23)
CO2: 26 mEq/L (ref 19–32)
Calcium: 8 mg/dL — ABNORMAL LOW (ref 8.4–10.5)
Calcium: 8.9 mg/dL (ref 8.4–10.5)
Chloride: 106 mEq/L (ref 96–112)
Chloride: 107 mEq/L (ref 96–112)
Chloride: 111 mEq/L (ref 96–112)
Creatinine, Ser: 1.03 mg/dL (ref 0.4–1.2)
Creatinine, Ser: 1.1 mg/dL (ref 0.4–1.2)
GFR calc Af Amer: >60 mL/min (ref >60)
GFR calc Af Amer: >60 mL/min (ref >60)
GFR calc Af Amer: >60 mL/min (ref >60)
GFR calc Af Amer: >60 mL/min (ref >60)
GFR calc non Af Amer: 52 mL/min — ABNORMAL LOW (ref >60)
GFR calc non Af Amer: 56 mL/min — ABNORMAL LOW (ref >60)
GFR calc non Af Amer: >60 mL/min (ref >60)
Glucose, Bld: 112 mg/dL — ABNORMAL HIGH (ref 70–99)
Glucose, Bld: 149 mg/dL — ABNORMAL HIGH (ref 70–99)
Potassium: 3.7 mEq/L (ref 3.5–5.1)
Potassium: 3.8 mEq/L (ref 3.5–5.1)
Potassium: 4 mEq/L (ref 3.5–5.1)
Potassium: 4.2 mEq/L (ref 3.5–5.1)
Sodium: 139 mEq/L (ref 135–145)

## 2011-03-01 LAB — HEMOCCULT GUIAC POC 1CARD (OFFICE): Fecal Occult Bld: POSITIVE

## 2011-03-29 NOTE — Op Note (Signed)
NAME:  [name], [name]          ACCOUNT NO.:  [id number]   MEDICAL RECORD NO.:  [id number]          PATIENT TYPE:  AMB   LOCATION:  DSC                          FACILITY:  [facility]   PHYSICIAN:  Katy Fitch. Sypher, M.D. DATE OF BIRTH:  [birth year]   DATE OF PROCEDURE:  04/03/2009  DATE OF DISCHARGE:                               OPERATIVE REPORT   PREOPERATIVE DIAGNOSES:  Chronic stenosing tenosynovitis, right long  finger and chronic stenosing tenosynovitis, left long finger.   POSTOPERATIVE DIAGNOSIS:  Chronic stenosing tenosynovitis, right long  finger and chronic stenosing tenosynovitis, left long finger.   OPERATION:  1. Release of right long finger, A1 pulley with limited synovectomy      and tented debridement.  2. Release of left long finger A1 pulley with tendon debridement.   OPERATING SURGEON:  Katy Fitch. Sypher, MD   ASSISTANT:  Marveen Reeks Dasnoit, PA-C   ANESTHESIA:  General sedation/2% lidocaine; flexor sheath block; and  palmar block, right and left long fingers.   ANESTHETIST:  Katy Fitch. Sypher, MD.   SUPERVISING ANESTHESIOLOGIST:  Zenon Mayo, MD   INDICATIONS:  [name] is a 55 year old woman well  acquainted with our practice.  She has had extensive wrist treatment  ultimately requiring a wrist fusion.  She has a history of bilateral  long finger stenosing tenosynovitis, unresponsive to anti-inflammatory  medication, activity modification, and steroid injection.   Due to failure to respond to nonoperative measures, she was brought to  the operating room at this time for release of a right long finger A1  pulley and release of a left long finger A1 pulley under local  anesthesia and sedation.   Preoperatively, she was advised the potential risks and benefits of  surgery.   Sometimes, we identify necrotic tendon that can fray and even rupture to  later date.  Occasionally, it is necessary to return to debride tendon  even a slip of the  superficialis to prevent recurrent triggering.   After informed consent, she was brought to the operating room at this  time.   PROCEDURE:  [name] was brought to the operating room and  placed in supine position on the operating table.   Following light sedation, the right and left arms were prepped with  Betadine soap solution and sterilely draped.  A pneumatic tourniquet was  applied proximal brachium.  We anticipate the use of an Esmarch bandage  on the right.   Procedure commenced.  With routine Betadine scrub and paint of both  upper extremities.   Left arm was exsanguinated with an Esmarch bandage and the arterial  tourniquet on the proximal left brachium inflated to 290 mmHg due to  systolic hypertension.  The procedure commenced with an oblique incision  in the distal palmar crease.  Subcutaneous tissues were carefully  divided revealing a remarkable amount of inflammatory tissue surrounding  the flexor sheath.  There was a very large A0 pulley as well as an A1  pulley.  Soft tissues were cleared off both pulleys and the A1 and A0  pulleys were released with scalpel and scissors.  The  tendons were  delivered and there was found to be a partial rupture of the ulnar slip  of the superficialis.  A pedunculated fragment of tendon was debrided  with a rongeur and scissors dissection.  There was a moderate degree of  fibrotic tenosynovium investing the superficialis and profundus tendons.  These were cleared with a rongeur.   Thereafter, a full active range of motion to the finger was identified.   The wound was repaired with a mattress suture of 5-0 nylon x3.  The  wound was dressed with Xeroflo sterile gauze and an Ace wrap.  Tourniquet was released with immediate capillary refill.   Attention was then directed to the right hand.  Due to the presence of a  right antecubital IV, the right hand and forearm were exsanguinated with  an Esmarch bandage and the  arterial tourniquet provided with the Esmarch  proximally on the forearm.  Procedure commenced with similar oblique  incision in the distal palmar crease.  Subcutaneous tissues were  carefully divided once again revealing a variant anatomy of an A0 and an  A1 pulley.  The flexor sheath was invested with quite a bit of  inflammatory tissue that was cleared with a Glorious Peach.  An A0 and A1 pulleys  were released.  Tendons were delivered.  The profundus tendon had quite  a bit of infiltrative fibrotic tenosynovium within the tendon.  This was  debrided with a rongeur.   Ms. Sandefer demonstrated active range of motion without triggering.   The wound was then repaired with mattress suture of 5-0 nylon.  The  wound was dressed with Xeroflo sterile gauze and Ace wrap.   For aftercare, she was advised to keep her hands dry for 3 days followed  by routine showering.  She was provided Vicodin 5 mg 1 p.o. q.4-6 h.  p.r.n. pain, 20 tablets without refill.      Katy Fitch Sypher, M.D.  Electronically Signed     RVS/MEDQ  D:  04/03/2009  T:  04/03/2009  Job:  [id number]

## 2011-03-29 NOTE — Op Note (Signed)
NAME:  [name], [name]          ACCOUNT NO.:  [id number]   MEDICAL RECORD NO.:  [id number]          PATIENT TYPE:  AMB   LOCATION:  DSC                          FACILITY:  [facility]   PHYSICIAN:  Katy Fitch. Sypher, M.D. 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   OPERATION:  1. Removal of ASIF stainless steel plate and 8 screws.  2. Injection of distal radioulnar joint with Depo-Medrol and      lidocaine.   OPERATING SURGEON:  Josephine Igo, MD   ASSISTANT:  No assistant.   ANESTHESIA:  General by LMA.   SUPERVISING ANESTHESIOLOGIST:  Germaine Pomfret, MD   INDICATIONS:  [name] is a 55 year old woman referred through  the courtesy of Dr. Artis Flock for evaluation and management of an arthritic  right wrist.  Clinical examination revealed prior wrist fusion performed  in August 2007 due to painful wrist arthritis.   We had planned on an elective plate and screw removal.   During 2007 and 2008, we had injected the distal radioulnar joint for  symptoms of mild arthralgia.  [name] requested that we inject  the distal radioulnar joint during anesthesia at this time.  After  informed consent, she is brought to the operating room.   PROCEDURE:  [name] is brought to the operating room and  placed in supine position upon the operating table.   Following an anesthesia consult with Dr. Jean Rosenthal, general anesthesia by  LMA was selected.   [name] was brought to the operating room and placed in supine  position  upon the operating table.  Under Dr. Edison Pace direct  supervision, general anesthesia by LMA technique was induced.   The right arm was prepped with Betadine soaping solution and sterilely  draped.  A pneumatic tourniquet was applied to the proximal right  brachium.   Following exsanguination of the right arm with an Esmarch bandage, the  arterial tourniquet was inflated to 240 mmHg.   Procedure commenced with excision of the prior surgical scar.  The  subcutaneous tissues were carefully divided, taking care to identify the  plate by palpation.  The pseudo-capsule over the plate was incised and  the extensor tendons, outcropping muscles and wrists retinaculum were  gently elevated.   After removal of the 8 screws, the plate was easily levered off of the  long finger metacarpal, carpus and distal radius.  This was removed and  passed off for sterilization in the autoclave.   The plate and screws will be provided to [name].   The wound was irrigated and bony overgrowth of the screw holes removed  with a rongeur.  After hemostasis was achieved, the wound was repaired  with subdermal sutures  of 4-0 Vicryl and intradermal segmental 3-0  Prolene and Steri-Strips.   The distal radioulnar joint was then palpated and identified by forearm  pronation and supination.  A 27-gauge needle was used to instrument the  distal radioulnar joint followed by introduction of 1 mL of a 50/50  mixture of Depo-Medrol 80 mg/mL and 1% plain lidocaine.   The wound was then infiltrated with 2% lidocaine for postoperative  analgesia.  A compressive dressing was applied with sterile gauze and  Ace wrap.  There were no apparent complications.   [name] tolerated surgery and anesthesia well.  She was  transferred to the recovery room with stable vital signs.   We will see her back in followup in the office for interval evaluation  in approximately 5-7 days.   For aftercare, she is provided a  prescription for Dilaudid 2 mg one or  two tablets p.o. q.4-6 h. p.r.n. pain, 30 tablets without refill.          Katy Fitch Sypher, M.D.  Electronically Signed     RVS/MEDQ  D:  07/03/2007  T:  07/04/2007  Job:  [id number]

## 2011-03-29 NOTE — Discharge Summary (Signed)
NAME:  [name], [name]          ACCOUNT NO.:  [id number]   MEDICAL RECORD NO.:  [id number]          PATIENT TYPE:  INP   LOCATION:  1521                         FACILITY:  [facility]   PHYSICIAN:  Malcolm T. Russella Dar, MD, FACGDATE OF BIRTH:  1956-04-20   DATE OF ADMISSION:  12/15/2008  DATE OF DISCHARGE:  12/23/2008                               DISCHARGE SUMMARY   ADMITTING DIAGNOSES:  74. A 55 year old female with recurrent lower gastrointestinal bleeding      with recent admission for same, presumed diverticular, rule out      possible small bowel etiology.  2. Non-insulin-dependent diabetes mellitus.  3. Hypertension.  4. History of renal cell cancer status post left nephrectomy.  5. History of adenomatous polyps status post polypectomy x1, December 04, 2008.  6. Anemia, posthemorrhagic, secondary to #1.   DISCHARGE DIAGNOSES:  1. Stable status post prolonged major lower gastrointestinal bleed      secondary to right colon diverticulum requiring coil embolization      of a branch of right colic artery, 29/52/8413.  2. Anemia, stable status post multiple blood transfusions.  3. Other diagnoses as listed above.   CONSULTATIONS:  1. Interventional radiology, Dr. Fredia Sorrow.  2. Surgery, Dr. Donell Beers.   PROCEDURES:  Bleeding scan, mesenteric angiogram with coil embolization  December 19, 2008.  Negative mesenteric angiogram December 17, 2008.   BRIEF HISTORY:  This is a pleasant 55 year old African American female  recently admitted to [facility] December 01, 2008, through December 05, 2008, with a lower GI bleed which was felt to be diverticular in  etiology.  She underwent colonoscopy during that admission with no  evidence of active bleeding noted at the time of procedure and then did  have polypectomy of a small sigmoid colon polyp done at that time.  She  required blood transfusion x3.  The patient was seen in the office on  December 12, 2008, still having some dark stools  tinged with blood.  Hemoglobin had gone from 9.7 to 8.2. That same afternoon she underwent  upper endoscopy and flexible sigmoidoscopy for further evaluation.  These were done by Dr. Jarold Motto.  There was no evidence of any active  bleeding on the sigmoidoscopy and there were diverticulae but dark  clotted heme consistent with old blood was noted.  EGD was negative.  On  the morning of February 1, the patient had recurrent active bleeding  with 6 grossly bloody stools.  She presented to the emergency room.  Hemoglobin was 7.3.  She was tachycardiac but blood pressure in the  normal range and is readmitted for acute lower GI bleed for supportive  medical management.   LABORATORY STUDIES:  Again, on admission hemoglobin was 7.3.  BUN was  normal, creatinine 0.92.  WBC 10, platelets 285.  Potassium 4.2.  She  was transfused 2 units of packed RBCs with hemoglobin up to 9.2 on  February 2.  On February 3, hemoglobin 7.8, hematocrit of 23.7.  On  February 4, hemoglobin 9.5.  On February 6, hemoglobin 9.6, hematocrit  of 28.5.  On February 7 after further  transfusion, hemoglobin 10.6 and  31.  On February 8, hemoglobin 9.8 and on February 9, hemoglobin 9.9,  hematocrit of 29.2.  The patient had a total of 6 units of packed RBCs  during this admission.   HOSPITAL COURSE:  The patient was admitted to the service of Dr. Rob Bunting who was covering the hospital.  She was volume repleted and  transfused 2 units of packed RBCs, kept at bowel rest and the following  morning had no further maroon stools but did have 1 small black stool.  Decision was made to proceed with Meckel's scan.  It was not felt at  that time that she was bleeding actively enough to benefit from nuclear  medicine bleeding scan.  However, later that morning, she did have  further active bleeding and underwent bleeding scan instead.  This was  positive for bleeding in the right upper quadrant, consistent with  hepatic  flexure of the colon.  On the morning of February 3, again, she  had stopped bleeding.  Hemoglobin had drifted down below 8 and she was  given 2 more units of packed cells.  Mesenteric arteriogram was  discussed with interventional radiology given that she had been having a  stuttering bleed over the past 2 weeks.  This was done per Dr. Bonnielee Haff on  February 3.  There was no evidence of bleeding and therefore no  intervention.  Surgery had also seen her in consultation on the 3rd, Dr  .Donell Beers, who recommended a repeat angiogram should she have evidence of  recurrent bleeding.  She had 1 small bloody stool on the 4th.  We opted  to proceed with repeat colonoscopy to see if she could benefit from any  therapeutic intervention.  This was done per Dr. Christella Hartigan on February 5.  She was found to have pancolonic diverticulosis, moderate, and a  diverticulum at the hepatic flexure with recent red blood and clot.  After multiple washings and observation, 1 of the diverticulum began  oozing.  This was injected with epinephrine and then endoclips were  placed.  Oozing continued.  She was then set up for repeat mesenteric  arteriogram on the 5th with Dr. Fredia Sorrow.  The endoclips were visualized  in the right colon.  No active bleeding was seen during the angiogram.  Coil embolization was performed across 3 transverse branches of the  right colic artery with 5 separate 3-mm diameter microcoils.  The colon  in this region remained vascularized.  The patient tolerated the  procedure well and was continued on observation.  She did not have any  evidence of further bleeding.  Hemoglobin finally stabilized.  Her diet  was gradually advanced.  She was ambulated and on February 9 she was  allowed discharge to home in a stable and improved condition with a  hemoglobin at 9.9.  She was to follow up in the office in 1 week for  repeat labs.  She was to remain out of work until her return office  visit and limit her  activity.  She was advised no aspirin or aspirin  products or anti-inflammatories.   MEDICATIONS ON DISCHARGE:  1. Glyburide 4 mg daily.  2. Altace 10 mg daily.  3. Januvia 5500 twice daily.      Amy Esterwood, PA-C      Malcolm T. Russella Dar, MD, [facility]  Electronically Signed    AE/MEDQ  D:  12/30/2008  T:  12/30/2008  Job:  [id number]   cc:  Iva Boop, MD,FACG  [facility]  [address] [place], [place] [address]   Dr. Penni Bombard

## 2011-03-29 NOTE — Op Note (Signed)
NAME:  [name], [name]          ACCOUNT NO.:  [id number]   MEDICAL RECORD NO.:  [id number]          PATIENT TYPE:  AMB   LOCATION:  DSC                          FACILITY:  [facility]   PHYSICIAN:  Katy Fitch. Sypher, M.D. DATE OF BIRTH:  24-Jul-1956   DATE OF PROCEDURE:  12/28/2007  DATE OF DISCHARGE:                               OPERATIVE REPORT   PREOPERATIVE DIAGNOSIS:  1. Recurrent median entrapped neuropathy, right wrist, status post      prior carpal tunnel release on June 29, 2006, performed at the      same setting as a right wrist fusion.  2. Distal radioulnar joint degenerative arthritis unresponsive to      splinting and steroid injection with x-ray evidence of advanced      degenerative changes at ulnar head and sigmoid notch.   POSTOPERATIVE DIAGNOSIS:  1. Recurrent median entrapped neuropathy, right wrist, status post      prior carpal tunnel release on June 29, 2006, performed at the      same setting as a right wrist fusion.  2. Distal radioulnar joint degenerative arthritis unresponsive to      splinting and steroid injection with x-ray evidence of advanced      degenerative changes at ulnar head and sigmoid notch.  3. 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   OPERATION:  1. Resection of right distal ulna with an intracapsular Darrach      procedure capsular reefing.  2. Re-exploration of carpal tunnel through two incision technique with      meticulous resection of scar band at distal wrist flexion crease.   SURGEON:  Katy Fitch. Sypher, M.D.   ASSISTANT:  Marveen Reeks. Dasnoit, P.A.-C.   ANESTHESIA:  General by LMA.   SUPERVISING ANESTHESIOLOGIST:  Quita Skye. Krista Blue, M.D.   INDICATIONS:  [name] is a 55 year old right-hand dominant  woman referred through the courtesy of Dr. Artis Flock for evaluation of a  chronically  painful right wrist.  Evaluation in 2007 revealed bone-on-  bone arthropathy of the right wrist and, at that time, she was offered  arthrodesis of the wrist to manage her pain.  On June 29, 2006, we  performed a right wrist arthrodesis with a dorsal ASIF plate and a right  carpal tunnel release procedure.  Postoperatively, she did well,  ultimately healing her fusion.  She did have some mild degenerative  arthritis of her distal radioulnar joint at that time, which  unfortunately, has progressed during the past 18 months.  On July 03, 2007, we removed her plate and injected the distal radioulnar joint.  She had relief following injection for a period of time.  She has had  subsequent injections in the office with recurrent distal radioulnar  joint pain. Recently, she noted numbness in her hand.  Electrodiagnostic  studies were repeated.  Her motor latency was more than 5 milliseconds.  Therefore, we recommended re-exploration of the carpal canal and distal  radioulnar  joint resection at this time.  After informed consent, [name] is brought to the operating room.  Preoperatively, she was  interviewed by Dr. Krista Blue who provided anesthesia informed consent.  He  recommended proceeding with general anesthesia by LMA technique.   PROCEDURE:  Jeralyn Nolden is brought to the operating room and  placed in a supine position on the operating table.  Following the  induction of general anesthesia by LMA technique, the right arm was  prepped with Betadine soap solution and sterilely draped.  A pneumatic  tourniquet was applied to the proximal right brachium.  1 gram of Ancef  was administered as an IV prophylactic antibiotic.  Following  exsanguination of the right arm with an Esmarch bandage, the arterial  tourniquet was inflated to 250 mmHg.  We approached the distal  radioulnar joint through the same dorsal incision used for wrist fusion.  A 4 cm portion of the prior surgical  scar was resected.  [name]  is notable to develop keloids.  She had thick scarring in the  subcutaneous region.  The distal radioulnar joint capsule was exposed  carefully taking care to avoid the dorsal ulnar sensory branch.  The  extensor digiti minimi was released from the fifth dorsal compartment  and the distal radioulnar joint capsule incised longitudinally.   The distal ulnar head was inspected.  She had grade 4 chondromalacia  over the distal and central surface articulating at the distal  radioulnar joint.  Multiple Freer retractors were placed and a 4 mm  osteotome was used to relieve a sleeve of periosteum and a small rim of  bone deep to the floor of the extensor carpi ulnaris.  This will  preserve the insertion of the triangular fibrocartilage and a small  cortical strip of bone beneath the extensor carpi ulnaris.  The entire  ulnar head was removed with the use of an oscillating saw and  rongeur/osteotome dissection.  A synovectomy of the distal radioulnar  joint was accomplished.  There was significant chondromalacia noted at  the sigmoid notch.   After irrigation and hemostasis, the capsule of the distal radioulnar  joint was invaginated with mattress sutures of 3-0 Ethibond followed by  repair of the skin with subdermal sutures of 4-0 Vicryl and intradermal  3-0 Prolene with Steri-Strips.  There were no apparent complications.  [name] tolerated the surgery and anesthesia well.  She was  subsequently placed in a sterile dressing and attention directed to her  palm.   The prior carpal tunnel incision was noted to be healed with a  hypertrophic scar.  This was resected followed by careful subcutaneous  dissection.  The distal margin of the previously released transverse  carpal ligament was identified.  The distal transverse carpal ligament  region and carpal canal region was generously decompressed.  However,  with palpation with a Penfield 4 elevator,  there was noted to be a very  tight constricting scar band at the level of the distal wrist flexion  crease.  The subcutaneous fascia was released and we attempted a release  of this band with subcutaneous technique. However, given a substantial  amount of adipose tissue obscuring our view, in my judgment, this could  only be safely released with a second incision just proximal to the  distal wrist flexion crease.  A transverse incision was fashioned at  this level and the various layers of fascia were carefully released  including the fascia overlying the scar band.  With  great care  visualizing through the palmar incision and the forearm incision, the  band was isolated and once the ulnar nerve, ulnar artery, and median  nerve were carefully protected, the band was released with scissors.  This widely opened the carpal canal.  It appeared that the reason for  residual compression was a keloid type scar band.  This was released to  the point of allowing my small finger admission adjacent to the nerve  for assured decompression.  After hemostasis was achieved, the wound was  thoroughly irrigated and the wound was repaired with intradermal 3-0  Prolene and Steri-Strips.  There were no apparent complications.   [name] tolerated the surgery and anesthesia well.  She was  placed in a sugar-tong splint with the forearm in supination to protect  the distal ulnar resection  We will see her back for follow up in one  week for suture removal  and advancement to an exercise program.  She understands it will take  approximately 6-8 weeks to recover from the distal ulna resection.  For  aftercare, she is provided prescriptions for Percocet 5 mg 1 p.o. q.4-  6h. p.r.n. pain, 20 tablets without refill.  Also, Keflex 500 mg q p.o.  q.8h. x4 days as a prophylactic antibiotic.      Katy Fitch Sypher, M.D.  Electronically Signed     RVS/MEDQ  D:  12/28/2007  T:  12/29/2007  Job:  [id number]

## 2011-03-29 NOTE — H&P (Signed)
NAME:  [name], [name]          ACCOUNT NO.:  [id number]   MEDICAL RECORD NO.:  [id number]          PATIENT TYPE:  INP   LOCATION:  0160                         [facility]AC[facility]   PHYSICIAN:  Rachael Fee, MD   DATE OF BIRTH:  09/07/1956   DATE OF ADMISSION:  12/01/2008  DATE OF DISCHARGE:                              HISTORY & PHYSICAL   CHIEF COMPLAINT:  Rectal bleeding.   HISTORY:  [name] is a pleasant 55 year old African American female, a  primary patient of Dr. Blair Heys, known to Dr. Leone Payor from for prior EGD  and colonoscopy.  The patient has history of renal cell carcinoma.  She  is status post left nephrectomy in 2004.  She has also had several  orthopedic procedures, TAH and BSO and is status post lap chole in  February 2007 for cholelithiasis.  She has history of morbid obesity,  hypertension and non-insulin dependent diabetes mellitus.  At this time,  she presents with onset of maroon stool, which started on Sunday evening  the 17th.  The patient says she has had four episodes of fairly large  volume maroon stool.  She was out of town at the time in [place].  She returned home yesterday.  She had no stools early in the day and  then last evening had two more maroon stools after she ate dinner.  She  presented to the emergency room with complaints of weakness, no chest  pain, shortness of breath.  Denied any abdominal pain or cramping,  though she says she has had some recent abdominal cramping in the lower  abdomen like menstrual cramps over the past month or so.  She was seen  and evaluated in the emergency room, found to be hemodynamically stable  but with hemoglobin of 9.1.  This is normocytic.  Review of E chart  shows she had a hemoglobin of 15.6 in November 2009.  She is admitted  with an acute lower GI bleed, rule out ischemic colitis sources  diverticular bleeding for supportive management.   CURRENT MEDICATIONS:  1. Glyburide 4 mg daily.  2.  Altace 10 mg daily.  3. Janumet 50/500 b.i.d.   ALLERGIES:  No known drug allergies.   PAST HISTORY:  As outlined above.   SOCIAL HISTORY:  The patient works for [employer].  She is nonsmoker,  nondrinker.   FAMILY HISTORY:  Negative for colon cancer or other GI disease.   REVIEW OF SYSTEMS:  Pertinent for a 20-pound weight loss in the past 3-4  months.  She has had some intermittent lower abdominal cramping as  outlined above.  Denies any chest pain or anginal symptoms.  No cough,  sputum production and shortness of breath.  No fever or chills.  All  other review of systems negative except as outlined in HPI.   PHYSICAL EXAM:  GENERAL:  Well-developed, morbidly obese African  American female in no acute distress.  VITAL SIGNS:  Temperature is 97, blood pressure 137/76, pulse 90-120, O2  sat 96 on room air.  HEENT: Nontraumatic, normocephalic.  EOMI, PERRLA.  Sclerae anicteric.  NECK:  Supple without nodes.  CARDIOVASCULAR:  Regular rate and rhythm with S1-S2.  No murmur or  gallop.  PULMONARY:  Clear to A and P.  ABDOMEN:  Large, soft.  She is mildly tender in the left lower quadrant.  There is no guarding or rebound.  No palpable mass or  hepatosplenomegaly.  Bowel sounds are active.  RECTAL:  Exam was not done at this time.  EXTREMITIES:  Without clubbing, cyanosis or edema.  NEURO:  The patient is alert and oriented x3.  Exam is grossly nonfocal.   LABORATORY STUDIES ON ADMISSION:  WBC of 10.8, hemoglobin 9.1, MCV of  88, platelets 205, INR of 1.  Electrolytes within normal limits.  LFTs  normal.  Albumin 2.8.   IMPRESSION:  35. A 55 year old female with acute lower GI bleed, rule out segmental      ischemic colitis, rule out diverticular.  2. History of renal cell carcinoma, status post left nephrectomy,      2004.  3. Status post total abdominal hysterectomy-bilateral salpingo      oophorectomy.  4. Status post lap chole, 2007.  5. Of adult-onset diabetes mellitus,  non-insulin dependent.  6. History of hypertension.  7. Morbid obesity.  8. Anemia acute secondary to above.   PLAN:  The patient is admitted to the service Dr. Wendall Papa for IV  fluid hydration, serial H and H transfusions as indicated to keep her  hemoglobin 9.  Will schedule her for CT scan of the abdomen and pelvis  and then further plans pending course.      Mike Gip, PA-C      Rachael Fee, MD  Electronically Signed    AE/MEDQ  D:  12/02/2008  T:  12/02/2008  Job:  [id number]   cc:   Quita Skye. Artis Flock, M.D.  Fax: [phone]

## 2011-03-29 NOTE — Discharge Summary (Signed)
NAME:  [name], [name]          ACCOUNT NO.:  [id number]   MEDICAL RECORD NO.:  [id number]          PATIENT TYPE:  INP   LOCATION:  0160                         [facility]   PHYSICIAN:  Iva Boop, MD,FACGDATE OF BIRTH:  10/14/1956   DATE OF ADMISSION:  12/01/2008  DATE OF DISCHARGE:  12/05/2008                               DISCHARGE SUMMARY   ADMISSION DIAGNOSES:  56. A 55 year old female with acute lower gastrointestinal bleed, rule      out segmental ischemic colitis, rule out diverticular hemorrhage.  2. Anemia acute secondary to above.  3. History of renal cell carcinoma status post left nephrectomy 2004.  4. Status post total abdominal hysterectomy and bilateral salpingo-      oophorectomy.  5. Status post laparoscopic cholecystectomy 2007.  6. Non-insulin dependent diabetes mellitus.  7. History of hypertension.  8. Morbid obesity.   DISCHARGE DIAGNOSES:  1. Resolved acute lower gastrointestinal bleed, presumed diverticular.  2. Anemia stable secondary to above.  3. History of renal cell carcinoma status post left nephrectomy 2004.  4. Status post total abdominal hysterectomy and bilateral salpingo-      oophorectomy.  5. Status post laparoscopic cholecystectomy 2007.  6. Non-insulin dependent diabetes mellitus.  7. History of hypertension.  8. Morbid obesity.  9. Polyp was also removed (adenomatous).   CONSULTATIONS:  None.   PROCEDURE:  Colonoscopy per Dr. Leone Payor December 04, 2008 at which time  the patient also had polypectomy of a 7 mm pedunculated polyp in the  sigmoid colon.   BRIEF HISTORY:  [name] is a pleasant 55 year old African American  female primary patient of Dr. Penni Bombard known to Dr. Leone Payor from previous  EGD and colon.  At this time, she presents with onset of maroon stool  which first occurred on November 30, 2008.  Since then, she has had 4  episodes of fairly large volume maroon stool.  She was out of town in  [place],  returned home and had no stools early in the day and then  last evening had 2 more maroon stools after eating dinner.  She  presented to the emergency room with complaints of weakness.  Denied any  abdominal pain or cramping, though stated that she had been having some  lower abdominal discomfort like menstrual cramps over the past month or  so.  She was seen and evaluated in the emergency room and found to be  hemodynamically stable, but with hemoglobin of 9.1.  She is admitted  with an acute lower GI bleed likely diverticular, consider segmental  ischemic colitis for supportive medical management.   LABORATORY DATA:  On admission December 01, 2008:  Hemoglobin 9.1,  hematocrit of 28.6, WBC of 10.8, platelets 205, MCV of 87.9.  Serial  values were obtained on December 02, 2008:  Hemoglobin is 8.1, hematocrit  of 24.4.  This dropped to a low 7.7 and hematocrit of 23.2.  On December 03, 2008:  Hemoglobin of 10.8, hematocrit of 32.3.  On  December 04, 2008:  Hemoglobin 10.3, hematocrit of 31.1.  On December 05, 2008:  Hemoglobin 9.7, hematocrit of 29.2.  Pro-time on  admission 13.2, INR of  1.  Electrolytes within normal limits.  BUN 22, creatinine 1.04, albumin  2.8.  LFTs within normal limits.   HOSPITAL COURSE:  The patient was admitted to the service of Dr. Wendall Papa who was covering on-call.  She was placed on a clear liquid diet.  Her hemoglobin drifted and she did receive 2 units of packed RBCs.  She  underwent CT scan of the abdomen and pelvis because of her left lower  quadrant tenderness, this was a negative study.  She continued to have  evidence of slow bleeding and received 1 more unit of packed RBCs.  The  decision was made to proceed with colonoscopy on December 04, 2008.  This  was done per Dr. Leone Payor.  The colon was clear without any evidence of  active bleeding.  She was noted to have moderate diverticulosis in both  the right and the left colon.  Again, no active bleeding  or old blood.  There was a 7 mm polyp noted in the sigmoid colon.  This was snared and  then cauterized.  The patient tolerated the procedure well.  Path on the  polyp was consistent with an adenomatous polyp.  The patient did not  manifest any active further active bleeding post-procedure.  She did  have one stool which was dark on the early morning hours of December 05, 2008.  We followed her hemoglobin throughout that day which remained  stable.  She did not have any further stools and was allowed discharge  to home on the evening of December 05, 2008.   FOLLOW UP:  1. Follow up in the office with Mike Gip, PA-C on January 29 at      11 a.m. and plan to follow up CBC at that time.  2. She was to keep her regular follow up with Dr. Penni Bombard.   DISCHARGE INSTRUCTIONS:  1. She was to avoid aspirin and aspirin products for now.  2. Continue her glyburide 4 mg daily.  3. Altace 10 mg daily.  4. Janumet 50/500 twice daily.      Amy Esterwood, PA-C      Iva Boop, MD,FACG  Electronically Signed    AE/MEDQ  D:  12/30/2008  T:  12/30/2008  Job:  [id number]   cc:   Iva Boop, MD,FACG  [facility]  [address] [place], [place] [address]   Penni Bombard, MD

## 2011-03-29 NOTE — Consult Note (Signed)
[name], [name]          ACCOUNT NO.:  [id number]   MEDICAL RECORD NO.:  [id number]          PATIENT TYPE:  INP   LOCATION:  1521                         FACILITY:  [facility]   PHYSICIAN:  Almond Lint, MD       DATE OF BIRTH:  01/31/1956   DATE OF CONSULTATION:  12/17/2008  DATE OF DISCHARGE:                                 CONSULTATION   CHIEF COMPLAINT:  Gastrointestinal bleed.   HISTORY OF PRESENT ILLNESS:  [name] is a very pleasant a 55-year-  old African American female on her second admission in the last 2-1/2  weeks for GI bleed.  She has had a total of 6 units of red blood cells.  She last time was admitted and had a colonoscopy which demonstrated  diverticulosis and some adenomatous sigmoid polyps.  She went home but  came back a week later to clinic with around 6 black stools.  She  dropped her hemoglobin a point and a half.  She had a repeat  sigmoidoscopy in the office that demonstrated just old blood.  EGD was  negative.  She came back to the ER several days ago with again 6 bloody  stools.  She felt lightheaded at that time.  While in the hospital this  admission she has had a bleeding scan which was positive for right upper  quadrant localization.  She underwent an angiogram today which was  unable to localize and embolize any bleeding.  She has had a stable  hematocrit today but it is of 23 and is going to get blood products for  that.  She currently feels well.   PAST MEDICAL HISTORY:  Hypertension, diabetes, morbid obesity, and  history of carpal tunnel syndrome.   PAST SURGICAL HISTORY:  Cholecystectomy by Dr. Luisa Hart in 2007 and a  renal cell carcinoma, status post left nephrectomy done laparoscopically  in 2004.   FAMILY HISTORY:  No colorectal cancer or liver disease.   SOCIAL HISTORY:  She denies smoking and does not use alcohol.   MEDICATIONS:  1. Glyburide10 mg daily.  2. Altace 10 mg daily.  3. Janumet 50/500 twice a day.   ALLERGIES:  None.   REVIEW OF SYSTEMS:  Negative except for the HPI.  Of note, she was short  of breath when she came in with all bleeding.   PHYSICAL EXAMINATION:  VITAL SIGNS:  Temperature 98.2, pulse 94, blood  pressure 112/76, respiratory rate 18, one stool today this morning which  was dark.  GENERAL:  On examination she is alert and oriented x3 in no acute  distress.  HEENT:  Normocephalic, atraumatic.  NECK:  Supple.  SKIN.  Without rashes.  MOOD AND AFFECT:  Normal.  ABDOMEN:  Soft, nontender and nondistended.  EXTREMITIES.  Warm, well perfused with a trace of edema.   LABORATORY DATA:  Hematocrit today 23.  Studies described above.   ASSESSMENT:  [name] is a very pleasant 55 year old morbidly  obese female with slow GI bleed.  She may ultimately require surgical  intervention.  However, given her diabetes and size, she would be at  risk for having  difficulty with a big operation.  If possible, it would  be ideal to localize the source of bleeding in the event that should she  continue to have bleeding that is uncontrolled, we would have a better  target.  The right upper quadrant localization does suggest that it is  hepatic flexure of the colon.  However in my experience, I have seen  several people with small bowel bleeds that localized to this area as  well.  She continues to have a slow ooze that is not extremely rapid, I  would ideally like to repeat colonoscopy that is after a good prep.  In  that regard we may be able to localize the lesion more to the hepatic  flexure.  If there is nothing seen I would recommend a capsule  endoscopy.  Then again is she has rapid bleeding,  I recommend repeating  the arteriogram to try to embolize.  She may ultimately end up requiring  a partial or total abdominal colectomy if she continues to bleed.  She  currently is not having hemodynamic instability and I think the floor is  appropriate level of care for her.       Almond Lint, MD  Electronically Signed     FB/MEDQ  D:  12/17/2008  T:  12/18/2008  Job:  [id number]

## 2011-03-29 NOTE — H&P (Signed)
NAME:  Monica, Benson          ACCOUNT NO.:  [id number]   MEDICAL RECORD NO.:  [id number]          PATIENT TYPE:  INP   LOCATION:  1521                         FACILITY:  [facility]   PHYSICIAN:  Rachael Fee, MD   DATE OF BIRTH:  March 12, 1956   DATE OF ADMISSION:  12/15/2008  DATE OF DISCHARGE:                              HISTORY & PHYSICAL   HISTORY OF PRESENT ILLNESS:  [name] is a 55 year old African  American female recently admitted to [facility] with maroon stools.  During that admission, the patient reports having packed red blood cells  and underwent a colonoscopy which was remarkable for diverticulosis and  an adenomatous sigmoid polyps removed with cautery.  The patient was  discharged home on 12/05/2008, but was seen in our office on December 12, 2008, with dark stools tinged with red blood.  We sent the patient for  an H&H and her hemoglobin has fallen from 9.7 at the time of her recent  hospital discharge to 8.2.  Later in the afternoon, the patient  underwent an EGD and a flexible sigmoidoscopy for further evaluation of  recurrent bleeding.  No active bleeding was seen on the flexible  sigmoidoscopy, but there were diverticula noted with dark clotted heme.  No post polypectomy ulcerations were seen.  EGD was negative except for  retained food in stomach.  This morning, the patient had recurrent lower  GI bleeding with six bloody stools this morning.  She presented to the  ER with a hemoglobin now at 7.3.  BUN is normal.  She was slightly  tachycardic on arrival, but vital signs are now stable.   PAST MEDICAL HISTORY:  1. Renal cell carcinoma status post left nephrectomy 2004.  2. Cholecystectomy 2007 for cholelithiasis.  3. Morbid obesity.  4. Hypertension.  5. Diabetes.   FAMILY MEDICAL HISTORY:  No colorectal cancer or liver diseases.   SOCIAL HISTORY:  Nonsmoker.  No alcohol.   MEDICATIONS:  1. Glyburide 10 mg daily.  2. Altace 10 mg daily.  3. Janumet 50/500 mg b.i.d.   ALLERGIES:  NO KNOWN DRUG ALLERGIES.   REVIEW OF SYSTEMS:  Positive for weakness.  Positive for mild shortness  of breath.  All other review of systems negative other than were noted  in the HPI.   PHYSICAL ASSESSMENT:  VITAL SIGNS:  Temperature 97.6, pulse 92,  respirations 18, blood pressure 149/94.  GENERAL:  [name] is a pleasant 55 year old female lying in bed  in no acute distress.  HEENT:  No scleral icterus.  Conjunctivae are pale.  NECK:  Supple without lymphadenopathy.  CARDIAC:  Regular rate and rhythm without appreciable murmurs.  RESPIRATORY:  Bilateral lung fields clear.  GI:  Normoactive bowel sounds.  ABDOMEN:  Soft, obese.  There is mild left lower quadrant tenderness to  palpation.  Unable to adequately assess for masses or organomegaly  secondary to body habitus.  EXTREMITIES:  No palmar erythema.  No lower extremity edema.  NEUROLOGICAL:  Alert and oriented.  SKIN:  Intact without significant rashes or lesions.   LABORATORY STUDIES:  Sodium 139, potassium 4.2, BUN 16, creatinine  0.92,  WBC 10, hemoglobin 7.3, hematocrit 22.4, MCV 90.2, platelets 285.   DIAGNOSTIC STUDIES:  None.   IMPRESSION:  1. Recurrent lower GI bleed, presumed diverticular.  Consider small      bowel lesion.  2. Diabetes, on oral agents.  3. Hypertension.  4. History of renal cell carcinoma, status post left nephrectomy.  5. History of adenomatous polyps December 04, 2008.   PLAN:  The patient will be admitted for bright red blood cells, may need  surgical consult if bleeding continues.  Hold home medications for now  except for Altace.  Accu-Chek sliding scale insulin.  DVT prophylaxis.      Willette Cluster, NP      Rachael Fee, MD  Electronically Signed    PG/MEDQ  D:  12/15/2008  T:  12/15/2008  Job:  [id number]

## 2011-04-01 NOTE — Discharge Summary (Signed)
   NAME:  [name], [name]                ACCOUNT NO.:  [id number]   MEDICAL RECORD NO.:  [id number]                   PATIENT TYPE:  INP   LOCATION:  0357                                 FACILITY:  [facility]   PHYSICIAN:  Valetta Fuller, M.D.               DATE OF BIRTH:  Oct 17, 1956   DATE OF ADMISSION:  12/16/2002  DATE OF DISCHARGE:                                 DISCHARGE SUMMARY   DISCHARGE DIAGNOSES:  1. Renal cell carcinoma pathologic stage PT1.  2. Diabetes mellitus.  3. Hypertension.   PROCEDURE:  Hand assisted left radical nephrectomy on December 16, 2002.   HOSPITAL COURSE:  [name] is a 55 year old female. She had had some  nonspecific abdominal pain and was incidentally noted on CT as well as  subsequent MRI to have a 4 cm solid mass involving the upper pole of her  left kidney. We discussed several treatment options with the patient and  elected to proceed with radical nephrectomy via hand assisted laparoscopic  approach. At the time of surgical exploration, there was no evidence of  extension beyond the kidney. Her surgery itself was uncomplicated. Her  postoperative course was also relatively unremarkable. Final pathology  showed a 3 1/2 cm tumor with a nuclear grade of 2/4. This was confined to  the kidney and was pathologic stage PT1. The patient was able to resume a  diet by postoperative day two. Postoperative hemoglobin was approximately 11  and renal function was normal with a creatinine of 1.1.   DISPOSITION:  The patient was discharged on postoperative day three. She had  had a bowel movement and was tolerating her normal diabetic diet well. Her  exam was unremarkable. She will be sent home for some Vicodin for pain and  normal instructions. She will followup in approximately a week for staple  removal.                                               Valetta Fuller, M.D.    DSG/MEDQ  D:  12/19/2002  T:  12/19/2002  Job:  [id number]   cc:    Iva Boop, M.D. [facility]  [address] [place], [place] [address]  Fax: [phone]

## 2011-04-01 NOTE — Op Note (Signed)
NAME:  [name], [name]          ACCOUNT NO.:  [id number]   MEDICAL RECORD NO.:  [id number]          PATIENT TYPE:  AMB   LOCATION:  DSC                          FACILITY:  [facility]   PHYSICIAN:  Katy Fitch. Sypher, M.D. DATE OF BIRTH:  September 14, 1956   DATE OF PROCEDURE:  06/29/2006  DATE OF DISCHARGE:                                 OPERATIVE REPORT   PREOPERATIVE DIAGNOSES:  1. 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  2. Right carpal tunnel syndrome with positive electrodiagnostic studies      documenting median neuropathy.   POSTOPERATIVE DIAGNOSES:  1. Bone-on-bone arthropathy, right wrist midcarpal joint with      identification of grade II to III scapholunate interosseous ligament      tear documented by direct exploration.  2. Right carpal tunnel syndrome.   OPERATION:  1. Arthrodesis of right radiocarpal articulation utilizing an 8 hole SIF      stainless steel plate.  2. Autogenous cortical cancellus right distal radial bone graft.  3. Through separate incision, right carpal tunnel release.   OPERATION SURGEON:  Josephine Igo, MD   ASSISTANT:  None.   ANESTHESIA:  General by LMA.   SUPERVISING ANESTHESIOLOGIST:  Dr. Gelene Mink   INDICATIONS:  [name] is a 55 year old woman who presented for  evaluation of a severely painful right wrist and hand numbness.  Clinical  examination suggested carpal tunnel syndrome.  Examination of her wrist  revealed pain with radial ulnar deviation and flexion extension, pain on  palpation of the midcarpal joint, and plain x-rays of her wrist demonstrated  loss of the hyaline articular cartilage at the midcarpal joint.   An MRI of her wrist confirmed extensive arthropathy.   We had a lengthy informed consent with [name].  Her pain was  disabling at this time.  We discussed the possibility of the midcarpal  fusion with a possibility at a later date she will require secondary  procedure versus a wrist  arthrodesis with autogenous grafting and plating  which would reliably resolve her wrist pain and likely be a single surgical  episode.  In addition, we recommended proceeding with release of right  transcarpal ligament.   After period of deliberation, she elected to proceed with a single stage  wrist fusion.   Preoperatively electrodiagnostic studies were obtained by [name].  These documented significant right carpal tunnel syndrome.   After informed consent, she is brought to the operating room at this time.   PROCEDURE:  [name] is brought to the operating room and placed  in supine position on operating table.   Following the induction of general anesthesia by LMA technique, the right  arm was prepped with Betadine soap solution, sterilely draped.  A pneumatic  tourniquet was applied to the proximal right brachium.  One gram of Ancef  was administered in the holding area as a prophylactic antibiotic 1 hour  prior to surgery.   Following exsanguination of the right arm with an Esmarch bandage, the  arterial tourniquet was inflated to 220 mmHg.   The procedure commenced with a short incision in the line of the ring  finger  and the palm.  Subcutaneous tissues were carefully divided within the palmar  fascia.  This split longitudinally to __________branch of the median nerve.  These were followed back to the transverse carpal ligament which was gently  isolated from the median nerve.   After the nerve was clearly separated from transcarpal ligament, the  ligament was released along its ulnar border with scissors extending into  the distal forearm.   This widely opened the carpal canal.  No mass or other predicaments are  noted.   Bleeding points along the margin of the released ligament were cauterized by  bipolar current followed by repair of the skin with intradermal 3-0 Prolene  suture and Steri-Strips.  Attention was then directed to the dorsum of the   wrist.  An incision was fashioned from the mid-diaphysis of the long finger  metacarpal to the outcropping muscle level of the distal radius.   Subcutaneous tissue carefully divided taking care to electrocauterized the  transverse veins.  The extensor retinaculum was split between the third and  fourth dorsal compartments, and the extensor pollicis longus was retracted  radially and the common finger extensors and the extensor indicis proprius  retracted in an ulnar direction.  The posterior interosseous nerve was  identified on the dorsum of the wrist and a 2 cm segment resected as a  posterior interosseous neurectomy.   The capsule of the wrist joint was elevated to expose the scaphoid,  capitate, lunate hamate and triquetrum and the carpometacarpal joint of the  long finger.   After synovectomy was accomplished, the midcarpal joint was inspected and  found to be completely denuded of hyaline cartilage on the articular  surfaces of the capitate, lunate, and a portion of the hamate.   The scapholunate interosseous ligament interestingly was patulous and  essentially nonfunctional, parenchyma the dorsal band.  There appeared to be  a chronic dissociative proximal row instability that was aggravating the  midcarpal instability.   I then proceeded to remove the remaining hyaline articular cartilage on the  adjacent surfaces of the radius, scaphoid, and lunate as well as the  scaphotrapezoid joint, long finger carpometacarpal joint. and triquetral  hamate joint.   A 8-mm wide osteotome was used to remove shavings of the radius, obtaining  about 15 mL of cortical cancellus graft.  The articular surfaces of the  radius, scaphoid, lunate, triquetrum, capitate, and hamate were then  feathered with a fine osteotome to bleeding cancellus bone followed by  interstitial packing of the cortical cancellus radial bone graft.  An 8 hole ASIF wrist arthrodesis plate of stainless steel was placed  with standard  technique utilizing 3.5 mm screws in the most proximal 3 holes, a 4 mm  cancellous screw in the 4th hole distally in the radius, and 2.7 mm screws  in the long finger metacarpal and capitate.   AP lateral C-arm images were obtained confirming satisfactory position of  the plate and screws.   A very satisfactory construct was achieved with the wrist in approximately  15 degrees of dorsiflexion, 10 degrees of ulnar deviation, and proper  position to allow pronation, supination prevention.   There were no apparent complications.  The wound was then irrigated followed  by further packing of cancellus graft and repair of the capsule over the  plate with mattress suture of 0 Vicryl and repair of the retinaculum with  mattress suture of 0 Vicryl.  Subcutaneous tissues were repaired with 3-0  Vicryl and intradermal 3-0  Prolene with Steri-Strips.   The wound was dressed with sterile gauze, sterile Kerlix, sterile Webril,  and dorsal palmar plaster sandwich splints.   There were no apparent complications.  Ms. Lekas tolerated surgery and  anesthesia well.  She was awakened from general anesthesia and transferred  to the recovery room with stable signs.   She will be admitted through recovery care center for observation of vital  signs and given appropriate analgesics in the form of IV PCA morphine p.o.  and IV Dilaudid and has had a 2% lidocaine block placed at her surgical  wound for postoperative analgesia.      Katy Fitch Sypher, M.D.  Electronically Signed     RVS/MEDQ  D:  06/29/2006  T:  06/29/2006  Job:  [id number]   cc:   Quita Skye. Artis Flock, M.D.

## 2011-04-01 NOTE — Op Note (Signed)
NAME:  [name], [name]          ACCOUNT NO.:  [id number]   MEDICAL RECORD NO.:  [id number]          PATIENT TYPE:  OIB   LOCATION:  1313                         [facility]ACILITY:  [facility]   PHYSICIAN:  Clovis Pu. Cornett, M.D.DATE OF BIRTH:  [birth year]   DATE OF PROCEDURE:  12/15/2005  DATE OF DISCHARGE:                                 OPERATIVE REPORT   PREOPERATIVE DIAGNOSIS:  Symptomatic cholelithiasis.   POSTOPERATIVE DIAGNOSIS:  Symptomatic cholelithiasis with dense intra-  abdominal adhesions.   PROCEDURE:  Laparoscopic cholecystectomy with intraoperative cholangiogram.   SURGEON:  Dr. Harriette Bouillon.   ASSISTANT:  Ollen Gross. Carolynne Edouard, M.D.   ANESTHESIA:  General endotracheal anesthesia with 0.25% Sensorcaine local  with epinephrine.   ESTIMATED BLOOD LOSS:  50 mL.   DRAINS:  None.   INDICATIONS FOR PROCEDURE:  The patient is a 55 year old female with  symptomatic cholelithiasis. She has had a previous abdominal hysterectomy  and left nephrectomy in the past. She presents today for laparoscopic and  possible open cholecystectomy. The procedure was discussed with her as well  as her condition and questions were answered. All risks were reviewed with  the patient. She agreed to proceed.   DESCRIPTION OF PROCEDURE:  The patient was brought to the operating room and  placed supine. After induction of general endotracheal anesthesia, the  abdomen was prepped and draped in a sterile fashion. I chose an incision  just above the umbilicus and she had a lower abdominal scar mid incision  about a centimeter in length and cut down to her fascia. I was able to open  the fascia and place my finger into the abdominal cavity. She did have  significant dense intra-abdominal adhesions but I was able to use my finger  to sweep away to create a space under direct vision. We then placed a Hassan  cannula at this point in time and a pursestring suture of #0 Vicryl to hold  it in place and insufflated  the abdominal cavity. The patient was placed in  reverse Trendelenburg and rolled to her left. A laparoscope was placed. Upon  inspections, there were adhesions of the greater omentum to the anterior  abdominal wall and the colon was just adjacent to this as well. I was able  though to drive the scope around the adhesions into the right upper quadrant  which was relatively free. We then placed two 5-mm ports in the right upper  quadrant under direct vision with local anesthesia. I then withdrew the  camera and placed a 5-mm scope in the right upper quadrant port and noticed  that the omentum was tacked to the abdominal wall and that the port was  sitting within this. We then took endoshears and took down the omentum and  adhesions to clear the area where the Hasson port was placed. The colon was  also taken down which was the left colon away from abdominal wall and this  was done with sharp dissection under direct vision. We carefully inspected  the colon and saw no evidence of bowel injury after this part of the case.  Once I was able to do  this, this freed up a tremendous amount of space and  we at that point replaced the 10-mm scope through the Hasson and resumed the  rest of the case at that point in time. The adhesions were quite dense and  took a total of 30 minutes to do just the adhesiolysis alone. Next, a  subxiphoid 10-mm port was placed under direct vision. The gallbladder was  identified and grasped by its dome and retracted to the patient's right  shoulder. A second grasper was used to grasp the infundibulum and to retract  it toward the patient's right lower quadrant. I used the dissector to  dissect the peritoneum away from the gallbladder and thus dissect out the  cystic duct at the junction of the cystic duct and infundibulum of the  gallbladder. This was dissected out circumferentially. Once we did this, we  placed a clip on the gallbladder side and made a small incision in  the  cystic duct for intraoperative cholangiogram. Through a separate stab  incision, a Cook cholangiogram catheter was introduced under direct vision.  This was placed in the cystic duct and controlled by a clip. Intraoperative  cholangiogram with fluoroscopy using one-half strength Hypaque dye was used.  The cystic duct, common duct and common hepatic duct were well visualized  with no evidence of stone, stricture or extravasation. There is free flow of  contrast down the common duct into the duodenum, up the hepatic duct and the  bifurcation was seen as well. At this point in time, the catheter was  removed, the cystic duct was double clipped and divided. Next, the cystic  artery was identified, double clipped and divided. The cautery was used to  dissect the gallbladder from the gallbladder fossa until the dome was  encountered. We were able to dissect it out at this point in time. An  EndoCatch bag was then placed through the umbilical port with the camera in  the subxiphoid port and the gallbladder was extracted. There was some oozing  from the gallbladder bed which we controlled with cautery. The oozing was  somewhat persistent but slow and I elected to place Surgicel at this point  with good control of the bleeding. At this point in time, we respecting the  abdominal cavity and irrigated it. The clips were on the cystic duct and  cystic artery with no signs of bleeding or bile extravasation. We  reinspected the colon where the adhesions were taken down and again this  looked like there was no evidence of any injury at this point in time. I  then deinsufflated the abdominal cavity and reinsufflated the abdominal  cavity, reinspected the colon and small bowel and again found no evidence of  injury or stool or succus.  At this point in time, the gall gallbladder bed  was hemostatic. All ports were subsequently removed without any port site bleeding. CO2 was subsequently released. The  umbilical port was removed and  I closed the umbilical fascia with a #0 Vicryl stitch with care taken not to  injure the underlying bowel. A 4-0 Monocryl was then used to close all skin  incisions. All sponge, needle and instrument counts were counted and found  to be correct at this portion of the case. Sterile dressings were applied.  The patient was awoke and taken to recovery in satisfactory condition.      Thomas A. Cornett, M.D.  Electronically Signed     TAC/MEDQ  D:  12/15/2005  T:  12/15/2005  Job:  [id number]   cc:   Vale Haven. Andrey Campanile, M.D.  Fax: [phone]

## 2011-04-01 NOTE — Op Note (Signed)
NAME:  [name], [name]                ACCOUNT NO.:  [id number]   MEDICAL RECORD NO.:  [id number]                   PATIENT TYPE:  INP   LOCATION:  0357                                 FACILITY:  [facility]   PHYSICIAN:  Valetta Fuller, M.D.               DATE OF BIRTH:  October 20, 1956   DATE OF PROCEDURE:  12/16/2002  DATE OF DISCHARGE:                                 OPERATIVE REPORT   PREOPERATIVE DIAGNOSIS:  Left renal mass.   POSTOPERATIVE DIAGNOSIS:  Left renal mass.   PROCEDURE:  Hand-assisted laparoscopic radical left nephrectomy.   SURGEON:  Valetta Fuller, M.D.   ASSISTANTS:  Bertram Millard. Retta Diones, M.D., and Crecencio Mc, M.D.   ANESTHESIA:  General endotracheal.   INDICATIONS:  The patient is a 55 year old female.  The patient was  evaluated because of some nonspecific abdominal discomfort.  As part of that  she had a CT of her abdomen, which showed a suspicious solid mass in the  upper pole of her left kidney measuring about 4 cm.  We went ahead and  repeated that with an MRI.  This showed also a 4 cm mass suspicious for  renal cell carcinoma.  The patient had no other significant urologic  symptoms and denied any hematuria.  We felt this had a high likelihood of  being a renal cell carcinoma.  The entire lesion was intraparenchymal and  was really not exophytic in any way.  Given its size of 4 cm and the fact  that it was not exophytic as well as the fact that the patient had what  appeared to be a normal contralateral kidney, we felt that radical  nephrectomy was the proper approach.  We spent considerable time with her  discussing the option of a partial nephrectomy as well as open versus hand-  assisted laparoscopic approaches.  We elected again to proceed with a  radical nephrectomy.  She presents now for the procedure.   DESCRIPTION OF PROCEDURE:  The patient was brought to the operating room,  where she had successful induction of general anesthesia.  She  was placed in  a supine position and a Foley catheter was placed.  The patient was secured  to the bed so we would be able to tilt the bed for improvement in exposure.  Of note, the patient was moderately to markedly obese and was very short in  stature.  After prepping and draping the patient, we made a small incision  periumbilically about 6 cm in the midline.  We opened the fascia and entered  the abdominal cavity.  No significant adhesions were appreciated.  We then  placed the lap disc in, placed the camera within the lap disc.  A  pneumoperitoneum was created and with that, we were able to place two 12 mm  trocars in the left lower quadrant, one for the camera and one for  dissection.  A third 5 mm trocar was placed in  the left upper quadrant for  retraction.  We then proceeded with the hand-assisted laparoscopic radical  nephrectomy.  The entire left colon and splenic flexure of the colon was  mobilized, exposing the underlying Gerota's fascia over the kidney.  Once we  had the colon completely mobilized, we were able to establish the plane  between the mesentery of the colon and the anterior and medial surface of  the kidney, bringing it down to the hilum.  Once we had the area of the  hilum identified, I went ahead and freed up the inferior pole of the kidney.  The gonadal was identified and clipped doubly proximally and distally.  The  ureter was identified and also double clipped.  Once the lower pole was  mobilized, we were able to lift it up and then identify the hilum.  There  was what appeared to be a single renal artery just inferior to the vein.  Some tedious dissection was utilized to free up the vein and the artery.  We  also controlled the adrenal branch entering the renal vein.  Once the artery  was identified, we were able to doubly clip this proximally.  Once the  artery was clipped, the vein decompressed nicely and was able to separately  be taken with the GIA  stapling device.  Once the vasculature was controlled,  we were able to go across the top of the kidney.  We were then able to free  some medial attachments, and the entire specimen was removed.  Hemostasis  was quite good.  We encountered no other significant difficulties or  problems.  The dissection was somewhat slow and tedious, but blood loss was  really quite minimal and would be 100 mL or less. Urine output of the  contralateral kidney remained excellent.  We then carefully inspected all  the port sites as they were removed.  No evidence of bleeding was  encountered.  The fascia was closed with #1 PDS.  The larger ports were  closed with a single fascial suture and then the skin was all closed with  staples.  The patient appeared to tolerate the procedure well.  There were  no obvious complications.                                               Valetta Fuller, M.D.    DSG/MEDQ  D:  12/16/2002  T:  12/16/2002  Job:  [id number]   cc:   Iva Boop, M.D. [facility]  [address] [place], [place] [address]  Fax: 1

## 2011-04-01 NOTE — H&P (Signed)
NAME:  [name], [name]                ACCOUNT NO.:  [id number]   MEDICAL RECORD NO.:  [id number]                   PATIENT TYPE:  INP   LOCATION:  0357                                 FACILITY:  [facility]   PHYSICIAN:  Valetta Fuller, M.D.               DATE OF BIRTH:  1956/09/21   DATE OF ADMISSION:  12/16/2002  DATE OF DISCHARGE:                                HISTORY & PHYSICAL   REASON FOR ADMISSION:  Postoperative care status post planned hand-assisted  laparoscopic left radical nephrectomy.   HISTORY OF PRESENT ILLNESS:  The patient is a 55 year old female.  She had a  CT scan done due to some nonspecific abdominal pain.  This showed an  incidental 4 cm mass involving the upper pole of her left kidney suspicious  for renal cell carcinoma.  The patient had no voiding symptoms or hematuria.  We felt that this abnormality was probably not responsible for her abdominal  pain which did subsequently resolve.  A subsequent MRI confirmed this to be  a suspicious lesion.  It was primarily intrarenal with no exophytic  component.  We felt that given the size of the tumor and the fact that it  was completely within the parenchyma of the kidney and probably abutting the  collecting system that the most prudent procedure was a radical nephrectomy.  This was especially true given what appeared to be a normal contralateral  kidney.  We had recommended strong consideration for a hand-assisted  laparoscopic approach which the patient elected to proceed with.  Our plan  is to perform that operation and then admit her for routine postoperative  care.   PAST MEDICAL HISTORY:  Significant for type 2 diabetes mellitus.  She is on  glyburide 5 mg b.i.d. for this.  She also has mild hypertension which is  controlled with Altace 5 mg per day.  She has no other major medical  illnesses.   ALLERGIES:  She has no drug allergies.   PAST SURGICAL HISTORY:  Otherwise really contributory only for  a  hysterectomy and some orthopedic procedures.   REVIEW OF SYSTEMS:  Otherwise negative and she specifically denies any  voiding dysfunction or gross hematuria.   PHYSICAL EXAMINATION:  GENERAL:  The patient is a moderately obese female.  VITAL SIGNS:  Height 5 feet 1 inch, weight 242 pounds.  Her blood pressure  on admission was 152/92 with a pulse of 94 and a respiratory rate of 16.  NECK:  The patient's neck showed no JVD or masses.  CHEST:  Clear.  ABDOMEN:  Obese but soft without tenderness or obvious mass.  EXTREMITIES:  Without edema.   LABORATORY DATA:  Preoperative urinalysis was unremarkable.  The patient's  hemoglobin was 13.8 with a creatinine of 0.7.    ASSESSMENT:  Left renal mass.  This is a 4 cm solid mass in the left upper  pole of the kidney, very suspicious for renal cell carcinoma.  The patient  is to undergo hand-assisted laparoscopic radical nephrectomy and then will  hopefully be admitted for routine postoperative care.                                               Valetta Fuller, M.D.    DSG/MEDQ  D:  12/18/2002  T:  12/18/2002  Job:  [id number]

## 2011-04-01 NOTE — Op Note (Signed)
NAME:  Monica, Benson                    ACCOUNT NO.:  [id number]   MEDICAL RECORD NO.:  [id number]                   PATIENT TYPE:  AMB   LOCATION:  DSC                                  FACILITY:  [facility]   PHYSICIAN:  Katy Fitch. Naaman Plummer., M.D.          DATE OF BIRTH:  11-02-1956   DATE OF PROCEDURE:  06/15/2004  DATE OF DISCHARGE:                                 OPERATIVE REPORT   PREOPERATIVE DIAGNOSIS:  Painful left thumb CMC arthrosis.   POSTOPERATIVE DIAGNOSIS:  Painful left thumb CMC arthrosis.   OPERATION:  1.  Resection arthroplasty of left trapezial metacarpal joint.  2.  Free palmaris longus intermetacarpal ligament reconstruction between      thumb and index metacarpal.   SURGEON:  Katy Fitch. Sypher, M.D.   ASSISTANT:  Marveen Reeks. Dasnoit, P.A.-C.   ANESTHESIA:  General by LMA.   ANESTHESIOLOGIST:  Quita Skye. Krista Blue, M.D.   INDICATIONS FOR PROCEDURE:  [name] is a 55 year old right hand  dominant woman referred by Dr. Erasmo Leventhal for evaluation and management of  a painful left thumb CMC joint.  Dr. Priscille Kluver had treated her with anti-  inflammatory medication and splints without relief.  He subsequently  referred her for hand surgery consult.  She was noted to have significant  pain with pinch and grasp.  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  Due to a failure to respond to nonoperative measures, she is  brought to the operating room at this time for reconstruction of her left  thumb CMC joint.   PROCEDURE:  Felix Pratt is brought to the operating room and placed  in supine position on the operating table.  Following induction of general  anesthesia by LMA, the left arm was prepped with Betadine solution and  sterilely draped.  A pneumatic tourniquet was applied to the proximal  brachium.  Following exsanguination of the limb with an Esmarch bandage, the  arterial tourniquet was inflated to 220 mmHg.  The procedure commenced with  a curvilinear incision directly over the [facility] joint.  The subcutaneous  tissues were carefully divided taking care to identify the radial sensory  branches.  The interval between the extensor pollicis brevis and adductor  pollicis longus tendons were incised sharply revealing capsule of the CMC  joint.  The trapezium was exposed subperiosteally with 64 beaver blade and  small osteotome.  The trapezium was morselized and removed piecemeal with a  rongeur.  After complete synovectomy of the [facility] joint, drill holes were  created, one through the base of the index metacarpal distal to the  articular facet for the thumb metacarpal and one distal to proximal from the  dorsal surface of the thumb metacarpal to the articular surface of the thumb  metacarpal.  Both were enlarged to 4.5 mm with sequential hand drilling.  A  very large caliber palmaris longus was harvested with a tendon stripper,  cleared of soft tissues, and used to  create an intermetacarpal ligament  reconstruction.  This was anchored by looping it deep to the extensor carpi  radialis longus insertion dorsally bringing it through the drill hole in the  index metacarpal and out the base of the index metacarpal up through the  base of the thumb metacarpal and weaving it into the periosteum and abductor  pollicis longus insertions along the proximal thumb metacarpal.  This  created a very satisfactory intermetacarpal ligament suspension plasty.  The  wound was inspected for bleeding points which were electrocauterized with  bipolar current.  The intermetacarpal ligament was tensioned appropriately  and secured with multiple interrupted mattress sutures of 4-0 FiberWire.  The wounds were irrigated thoroughly and subsequently closed with  intradermal 3-0 Prolene and Steri-Strips.  There were no apparent  complications.  Ms. Ayars tolerated the surgery and anesthesia well.  She was transferred to the recovery room with stable vital  signs.   For aftercare, she is admitted to [facility] for observation  of her vital signs, appropriate analgesics in the form of IV PCA morphine,  p.o. and IV Dilaudid, and Ancef 1 gram IV q.8h. X 24 hours.                                               Katy Fitch Naaman Plummer., M.D.    RVS/MEDQ  D:  07/16/2004  T:  07/17/2004  Job:  [id number]

## 2011-04-01 NOTE — H&P (Signed)
NAME:  [name], [name]          ACCOUNT NO.:  [id number]   MEDICAL RECORD NO.:  [id number]          PATIENT TYPE:  EMS   LOCATION:  ED                           FACILITY:  [facility]   PHYSICIAN:  Angelia Mould. Derrell Lolling, M.D.DATE OF BIRTH:  Apr 21, 1956   DATE OF ADMISSION:  11/22/2004  DATE OF DISCHARGE:                                HISTORY & PHYSICAL   CHIEF COMPLAINT:  Right lower quadrant abdominal pain.   HISTORY OF PRESENT ILLNESS:  This is a 55 year old black female who states  that she has had intermittent right lower quadrant pain.  She states that  this only bothers her when she would cough.  She denies any other symptoms.  She now states that for the past 48 hours she has had constant right lower  quadrant pain.  She did not sleep well last night.  She has had some  anorexia but denies nausea and vomiting.  She had a normal bowel movement  yesterday.  She has been voiding normally.  She states that it hurts a  little bit when she walks.  She is really not having any fever, but states  she has had a little bit of chills.  She states that Dr. Margrett Rud did  a rectal exam which was normal.  I was called by Dr. Karma Ganja who asked  that I evaluate her for possible appendicitis and she was brought to the  [facility] Emergency Room for my evaluation.  She is being admitted for  further evaluation and management.   PAST MEDICAL HISTORY:  1.  Total abdominal hysterectomy and bilateral salpingo-oophorectomy.  This      was done in 2000 by Dr. Huel Cote.  This was done because of an      abnormal PAP smear but there was no cancer.  2.  Left nephrectomy for renal cell carcinoma by Dr. Barron Alvine 2004.  3.  Left knee arthroscopy x3 by Dr. Glenard Haring.  4.  Left hand surgery by Dr. Laroy Apple.  5.  Obesity.  6.  Type 2 diabetes.  7.  Hypertension.  8.  She has been told she had an umbilical hernia.   CURRENT MEDICATIONS:  1.  Premarin 0.625 mg daily.  2.  Altace 5 mg  daily.  3.  Avandia, dose unknown, 1 pill daily.  4.  Toprol, dose unknown, 1/2 tablet daily.   ALLERGIES:  None.   SOCIAL HISTORY:  The patient is divorced, has 3 children, works as a Scientific laboratory technician for [employer], denies the use of alcohol or tobacco.   FAMILY HISTORY:  Mother living, has hypertension, father deceased in a motor  vehicle accident.  He also had diabetes and coronary artery disease.  Five  sisters and four brothers living and well.  One sister has hypertension.   REVIEW OF SYSTEMS:  Fifteen system review of systems was performed, is  noncontributory except as described above.   PHYSICAL EXAMINATION:  A very pleasant, significantly obese black female in  no acute distress.  Temperature 98.7, blood pressure 154/97, pulse 82,  respirations 20, oxygen saturation 95% on room  air.  EYES:  Sclerae are clear, extraocular movements intact.  EARS, NOSE AND THROAT:  ears,nose,tongue and oropharynx are without gross  lesions.  NECK:  Supple, somewhat short, no palpable mass, no jugular venous  distention, no skin change.  LUNGS:  Clear to auscultation.  No chest wall tenderness, no CVA tenderness.  HEART:  Regular rate and rhythm, no murmur.  Radial and femoral pulses are  palpable.  BREASTS:  Not examined.  ABDOMEN:  Obese.  Soft.  Not distended.  Active bowel sounds.  Liver and  spleen not enlarged.  Well healed scars in the lower abdomen in the midline  from her previous hysterectomy.  She is tender in the right lower quadrant  laterally.  There is minimal if any guarding.  There is no rebound, direct  or indirect.  Tenderness is not that dramatic.  No mass noted.  EXTREMITIES:  She moves all 4 extremities well without pain or deformity.  NEUROLOGIC:  No gross motor or sensory deficits.   DATA REVIEWED:  CBC shows hemoglobin 13.6, white blood cell count of 9,600  with a normal differential.  Comprehensive metabolic panel is normal except  for a glucose of 153.   Urinalysis is clear.   ASSESSMENT:  1.  Right lower quadrant pain.  While appendicitis remains in the      differential this seems a bit atypical after 48 hours of symptoms and      minimal tenderness and a normal CBC and no nausea.  I think that further      evaluation would be warranted prior to considering surgical      intervention.  2.  Morbid obesity.  3.  Hypertension.  4.  Type 2 diabetes.  5.  History of left nephrectomy for renal cell carcinoma.  6.  History of total abdominal hysterectomy and bilateral salpingo-      oophorectomy.   PLAN:  1.  The patient will be admitted .  2.  She will be hydrated.  3.  We will proceed with CT scan of the abdomen and pelvis now to see if we      can get further clarification of the cause of her right lower quadrant      pain.     Hayw   HMI/MEDQ  D:  11/22/2004  T:  11/22/2004  Job:  [id number]

## 2011-04-01 NOTE — H&P (Signed)
NAME:  [name], [name]          ACCOUNT NO.:  [id number]   MEDICAL RECORD NO.:  [id number]          PATIENT TYPE:  INP   LOCATION:  1619                         FACILITY:  [facility]   PHYSICIAN:  Hettie Holstein, D.O.    DATE OF BIRTH:  1956/08/09   DATE OF ADMISSION:  11/05/2005  DATE OF DISCHARGE:  11/06/2005                                HISTORY & PHYSICAL   PRIMARY CARE PHYSICIAN:  Duffy Rhody C. Andrey Campanile, M.D.   CHIEF COMPLAINT:  Right-sided subcostal chest pain radiating to the back.   HISTORY OF PRESENT ILLNESS:  [name] is a pleasant, morbidly obese,  55 year old African-American female who is status post left nephrectomy in  2004 for renal cell carcinoma, who was in her usual state of health until  Thursday afternoon when she started developing some chest tightness and  discomfort, most specifically when she was lying on her right side.  She has  not been able to sleep well.  She complains of some stabbing pain and she  reported some chills.  She denies any nausea and vomiting, or diarrhea.  She  denied any shortness of breath.  While she was visiting her mother today,  she tried some TUMS and Ginger-Ale without relief.  In the emergency  department she was again seen.  She had received some Tylenol and four baby  aspirin and her pain continued at 7-8/10.  She did have a white count of  18.9 thousand and attempted a GI cocktail without relief as well.  In any  event, she does not describe any classic anginal-type pain.  No left-sided  radiation, diaphoresis, or shortness of breath.  She continues to walk on  her treadmill about 20-30 minutes three times a week and experiences no  dyspnea.  She does not have significant family history for coronary artery  disease, however, she is diabetic and morbidly obese.   I attempted to arrange outpatient HIDA scan to rule out the possibility of  cholecystitis.  In the emergency department, she underwent CT scan of her  chest and  evaluation of chest pain and PE possibility that was negative,  revealed positive cholelithiasis incidentally.  This could not be arranged.  I am planning on further evaluating via HIDA scan and repeat CBC in the  morning.  If this is negative, it may be reasonable to allow her to go home  with close outpatient follow-up.   PAST MEDICAL HISTORY:  As noted above, this is significant for left  nephrectomy for renal cell cancer in 2004 by Valetta Fuller, M.D.  She has  had a total abdominal hysterectomy and bilateral salpingo-oophorectomy.  She  has had some orthopedic and hand surgeries.  Her orthopedist is John L.  Rendall, M.D. and her hand surgeon is Katy Fitch. Sypher, M.D.  She was  morbidly obese.  She carries a diagnosis of diabetes, non-insulin requiring  as well as hypertension.   HOME MEDICATIONS:  She does not know the dosages.  1.  She is on Altace, she thinks either 5 or 10 mg daily.  2.  Avandia 8 mg daily.  3.  Toprol  XL 12.5 mg daily.  4.  She takes no aspirin at home.   ALLERGIES:  No known drug allergies.   SOCIAL HISTORY:  She denies tobacco or alcohol. She exercises about three  times per week on a treadmill for 20-30 minutes at a time.   FAMILY HISTORY:  Her mother is in her 13's and she states has no known heart  disease or known cancers.  Her father died due to an accident several years  ago and she states that he had been healthy prior to the circumstance.   REVIEW OF SYSTEMS:  She has had no nausea, vomiting, or diarrhea.  No cough,  no urinary complaints.  No swelling of her extremities.  No melena or  coffeeground emesis.  Her stools have not changed in color.  Further review  is unremarkable.   PHYSICAL EXAMINATION:  VITAL SIGNS:  Blood pressure 128/58, however, on  presentation, her blood pressure was 196/112.  Heart rate is 81,  respirations 20, O2 saturation is 99% on room air.  Her temperature is 97.8.  GENERAL:  [name] is pleasant and in no  acute distress.  She does not  appear toxic.  NECK:  Supple and nontender.  There is no palpable thyromegaly or mass.  CARDIOVASCULAR:  Normal S1 and S2.  LUNGS:  Clear bilaterally.  ABDOMEN:  Tenderness in the right upper quadrant with radiation to her mid  back at the level of T7-8.  She has no rebound.  No positive Murphy's on  examination.  Bowel sounds are normal active.  Again, she is obese.  There  is no palpable mass.  EXTREMITIES:  Warm, nonedematous.  Peripheral pulses are symmetrical and  palpable.  NEUROLOGY:  Euthymic.  Her affect is stable.  She moves all four extremities  without difficulty and there is no focal neurologic deficit.   LABORATORY DATA:  White count was 18.9 thousand, hemoglobin 12.2, platelet  count 195, and MCV 85.  Initial point of care at 0025 was negative.  Sodium  137, potassium 3.9, BUN 14, creatinine 1.0, glucose 148.  AST/ALT 17/21.  Albumin 2.9, total protein 6.8.   Chest x-ray revealed mild bronchitic changes.  CT scan revealed no acute  findings and no PE, but there was cholelithiasis.   ASSESSMENT:  1.  Persistent right upper quadrant pain initially felt to be chest pain in      the emergency department.  I feel that her predominant complaint is      right upper quadrant pain radiating to her mid-back, noncardiac pain.  2.  History of diabetes, currently not on insulin.  3.  Obesity.  4.  Leukocytosis of undetermined etiology.  5.  Cholelithiasis incidentally found.  6.  Status post nephrectomy for renal cell carcinoma.  7.  Hypoalbuminemia, mild.   PLAN:  At this time, [name] does appear quite stable and we will  admit her as a 23-hour observation status and requested a HIDA scan be  performed to rule out the possibility for cholecystitis as a consideration.  Her white  count is elevated and I do not have good explanation for this and she continues to have 7-8 out 10 right upper quadrant pain.  Therefore, I will  request that  this be performed and ask her to follow up with Dr. Margrett Rud this week post hospitalization.      Hettie Holstein, D.O.  Electronically Signed     ESS/MEDQ  D:  11/06/2005  T:  11/07/2005  Job:  [id number]   cc:   Vale Haven. Andrey Campanile, M.D.  Fax: [phone]

## 2011-04-25 ENCOUNTER — Encounter: Payer: Self-pay | Admitting: Internal Medicine

## 2011-08-05 LAB — BASIC METABOLIC PANEL
BUN: 20
CO2: 28
Chloride: 105
Creatinine, Ser: 0.92
Potassium: 4.3

## 2011-08-05 LAB — POCT HEMOGLOBIN-HEMACUE: Hemoglobin: 12.8

## 2011-08-15 ENCOUNTER — Telehealth: Payer: Self-pay | Admitting: Internal Medicine

## 2011-08-15 NOTE — Telephone Encounter (Signed)
Patient had pain over the weekend severe lower abdominal pain and nausea and vomiting.  She says the pain was a 11 on a scale of 1-10.  She says the pain is much better today she says a 4 or 5.  I have scheduled her an appt for tomorrow with Willette Cluster RNP.  She does have a history of diverticulitis.

## 2011-08-16 ENCOUNTER — Ambulatory Visit (INDEPENDENT_AMBULATORY_CARE_PROVIDER_SITE_OTHER): Payer: Self-pay | Admitting: Nurse Practitioner

## 2011-08-16 ENCOUNTER — Other Ambulatory Visit: Payer: Self-pay | Admitting: Nurse Practitioner

## 2011-08-16 ENCOUNTER — Encounter: Payer: Self-pay | Admitting: Nurse Practitioner

## 2011-08-16 VITALS — BP 152/70 | HR 82 | Ht 61.0 in | Wt 247.0 lb

## 2011-08-16 DIAGNOSIS — R109 Unspecified abdominal pain: Secondary | ICD-10-CM

## 2011-08-16 DIAGNOSIS — K5730 Diverticulosis of large intestine without perforation or abscess without bleeding: Secondary | ICD-10-CM

## 2011-08-16 DIAGNOSIS — R1030 Lower abdominal pain, unspecified: Secondary | ICD-10-CM | POA: Insufficient documentation

## 2011-08-16 DIAGNOSIS — Z86010 Personal history of colonic polyps: Secondary | ICD-10-CM

## 2011-08-16 LAB — POCT I-STAT, CHEM 8
Calcium, Ion: 1.1 — ABNORMAL LOW
HCT: 46
Hemoglobin: 15.6 — ABNORMAL HIGH
Sodium: 137
TCO2: 26

## 2011-08-16 LAB — DIFFERENTIAL
Basophils Absolute: 0.2 — ABNORMAL HIGH
Basophils Relative: 2 — ABNORMAL HIGH
Eosinophils Absolute: 0.2
Eosinophils Relative: 2
Monocytes Absolute: 0.4

## 2011-08-16 LAB — CBC
HCT: 43.7
Hemoglobin: 14.2
MCHC: 32.4
Platelets: 229
RDW: 13.8

## 2011-08-16 LAB — URINE MICROSCOPIC-ADD ON

## 2011-08-16 LAB — GLUCOSE, CAPILLARY

## 2011-08-16 LAB — URINALYSIS, ROUTINE W REFLEX MICROSCOPIC
Glucose, UA: >1000 — AB
Ketones, ur: NEGATIVE
Leukocytes, UA: NEGATIVE
Nitrite: NEGATIVE
Protein, ur: 30 — AB
pH: 5.5

## 2011-08-16 MED ORDER — HYOSCYAMINE SULFATE 0.375 MG PO TB12: ORAL_TABLET | ORAL | Status: DC

## 2011-08-16 NOTE — Assessment & Plan Note (Addendum)
Recurrent lower abdominal cramps, worse with meals,  of two weeks duration. Abdominal exam not overly concerning, no bowel changes or rectal bleeding.. She described similar pain at her visit in 2010, Levbid was helpful. Will refill the Levbid but if no improvement over the next couple of days will consider treating empirically for diverticulitis with Cipro and Flagyl.  I  asked her to call Rene Kocher , RN tomorrow with condition update.

## 2011-08-16 NOTE — Progress Notes (Signed)
[name] [id number] [birth year]   HISTORY OR PRESENT ILLNESS : [name] is a 55 year old female known to Dr. Leone Payor for history of diverticular bleed in 2010 and a questonable history of IBS.   Patient presents today with a two-week history of lower abdominal pain which escalated on Saturday, four days ago. She's had some associated nausea but no vomiting, no bowel changes. Absolutely no blood in her stool. Over the last few days her pain has improved but is still at about a 4-5 out of 10. Pain is crampy, like menstrual cramps, unrelieved with defectaion.  Eating definately makes it worse. In 2010 she was given Hyoscyamine which "worked okay". No unusual vaginal discharge or bleeding. She has had a TAH. No dysuria. Weight stable.   Current Medications, Allergies, Past Medical History, Past Surgical History, Family History and Social History were reviewed in [employer] record.  PHYSICAL EXAMINATION : General: Obese, black female in no acute distress Head: Normocephalic and atraumatic Eyes:  sclerae anicteric,conjunctive pink. Ears: Normal auditory acuity Mouth: No deformity or lesions Neck: Supple, no masses.  Lungs: Clear throughout to auscultation Heart: Regular rate and rhythm; no murmurs heard Abdomen: Soft, obese, mild diffuse lower abdominal tenderness. No masses or hepatomegaly noted. Normal bowel sounds Rectal: Not done. Musculoskeletal: Symmetrical with no gross deformities  Skin: No lesions on visible extremities Extremities: No edema or deformities noted Neurological: Alert oriented x 4, grossly nonfocal Cervical Nodes:  No significant cervical adenopathy Psychological:  Alert and cooperative. Normal mood and affect  ASSESSMENT AND PLAN :

## 2011-08-16 NOTE — Patient Instructions (Signed)
We have refilled the Levbid medication and sent the refill to your pharmacy [facility] [address]  Please call us with a condition update on Thursday 08-18-2011.  You can ask for Alyxandria Wentz at [phone].

## 2011-08-18 NOTE — Assessment & Plan Note (Signed)
Diverticular bleed 2010 requiring hemostasis for oozing at hepatic flexure.

## 2011-08-18 NOTE — Progress Notes (Signed)
Reviewed and agree. ?Ct scan if no improvement .

## 2011-08-18 NOTE — Assessment & Plan Note (Signed)
Adenomatous colon polyp 2010

## 2011-08-26 LAB — BASIC METABOLIC PANEL
CO2: 28
Calcium: 9.1
Creatinine, Ser: 0.92
GFR calc Af Amer: >60
GFR calc non Af Amer: >60
Sodium: 138

## 2011-08-26 LAB — POCT HEMOGLOBIN-HEMACUE
Hemoglobin: 12.5
Operator id: 116011

## 2011-08-30 LAB — URINALYSIS, ROUTINE W REFLEX MICROSCOPIC
Bilirubin Urine: NEGATIVE
Ketones, ur: NEGATIVE
Nitrite: NEGATIVE
Protein, ur: 30 — AB
Urobilinogen, UA: 0.2

## 2011-08-30 LAB — URINE CULTURE

## 2011-09-19 ENCOUNTER — Other Ambulatory Visit: Payer: Self-pay

## 2011-09-19 ENCOUNTER — Inpatient Hospital Stay (HOSPITAL_COMMUNITY)
Admission: EM | Admit: 2011-09-19 | Discharge: 2011-09-22 | DRG: 378 | Disposition: A | Discharge status: Home / Self Care | Payer: Self-pay | Attending: Internal Medicine | Admitting: Internal Medicine

## 2011-09-19 ENCOUNTER — Encounter (HOSPITAL_COMMUNITY): Payer: Self-pay | Admitting: *Deleted

## 2011-09-19 DIAGNOSIS — K5731 Diverticulosis of large intestine without perforation or abscess with bleeding: Secondary | ICD-10-CM | POA: Insufficient documentation

## 2011-09-19 DIAGNOSIS — Z6841 Body Mass Index (BMI) 40.0 and over, adult: Secondary | ICD-10-CM

## 2011-09-19 DIAGNOSIS — Z860100 Personal history of colon polyps, unspecified: Secondary | ICD-10-CM

## 2011-09-19 DIAGNOSIS — K922 Gastrointestinal hemorrhage, unspecified: Secondary | ICD-10-CM

## 2011-09-19 DIAGNOSIS — K625 Hemorrhage of anus and rectum: Secondary | ICD-10-CM | POA: Diagnosis present

## 2011-09-19 DIAGNOSIS — E119 Type 2 diabetes mellitus without complications: Secondary | ICD-10-CM | POA: Diagnosis present

## 2011-09-19 DIAGNOSIS — Z905 Acquired absence of kidney: Secondary | ICD-10-CM

## 2011-09-19 DIAGNOSIS — Z85528 Personal history of other malignant neoplasm of kidney: Secondary | ICD-10-CM

## 2011-09-19 DIAGNOSIS — I10 Essential (primary) hypertension: Secondary | ICD-10-CM | POA: Diagnosis present

## 2011-09-19 HISTORY — DX: Encounter for other specified aftercare: Z51.89

## 2011-09-19 LAB — COMPREHENSIVE METABOLIC PANEL
AST: 16 U/L (ref 0–37)
Albumin: 2.7 g/dL — ABNORMAL LOW (ref 3.5–5.2)
Alkaline Phosphatase: 93 U/L (ref 39–117)
BUN: 30 mg/dL — ABNORMAL HIGH (ref 6–23)
CO2: 22 mEq/L (ref 19–32)
Chloride: 102 mEq/L (ref 96–112)
GFR calc non Af Amer: 62 mL/min — ABNORMAL LOW (ref >90)
Potassium: 4.3 mEq/L (ref 3.5–5.1)
Total Bilirubin: 0.1 mg/dL — ABNORMAL LOW (ref 0.3–1.2)

## 2011-09-19 LAB — CBC
HCT: 34.1 % — ABNORMAL LOW (ref 36.0–46.0)
Hemoglobin: 11.1 g/dL — ABNORMAL LOW (ref 12.0–15.0)
MCHC: 32.6 g/dL (ref 30.0–36.0)
RBC: 3.98 MIL/uL (ref 3.87–5.11)
WBC: 11.2 10*3/uL — ABNORMAL HIGH (ref 4.0–10.5)

## 2011-09-19 LAB — DIFFERENTIAL
Basophils Absolute: 0 10*3/uL (ref 0.0–0.1)
Basophils Relative: 0 % (ref 0–1)
Lymphocytes Relative: 33 % (ref 12–46)
Monocytes Relative: 6 % (ref 3–12)
Neutro Abs: 6.6 10*3/uL (ref 1.7–7.7)
Neutrophils Relative %: 59 % (ref 43–77)

## 2011-09-19 MED ORDER — SODIUM CHLORIDE 0.9 % IV SOLN: INTRAVENOUS | Status: DC

## 2011-09-19 MED ORDER — SODIUM CHLORIDE 0.9 % IV BOLUS (SEPSIS)
1000.0000 mL | Freq: Once | INTRAVENOUS | Status: DC
  Administered 2011-09-19: 1000 mL via INTRAVENOUS

## 2011-09-19 MED ORDER — SODIUM CHLORIDE 0.9 % IV SOLN
8.0000 mg/h | INTRAVENOUS | Status: DC
  Administered 2011-09-20 – 2011-09-22 (×4): 8 mg/h via INTRAVENOUS
  Filled 2011-09-19 (×5): qty 80

## 2011-09-19 MED ORDER — SODIUM CHLORIDE 0.9 % IV SOLN
80.0000 mg | Freq: Once | INTRAVENOUS | Status: AC
  Administered 2011-09-19: 80 mg via INTRAVENOUS
  Filled 2011-09-19: qty 80

## 2011-09-19 NOTE — ED Provider Notes (Addendum)
History     CSN: [id number] Arrival date & time: 09/19/2011  4:09 PM   First MD Initiated Contact with Patient 09/19/11 1636      Chief Complaint  Patient presents with  . Rectal Bleeding    HPI 54yoF h/o diverticulosis with LGIB pw BRBPR. Pt states that since 0930 this morning she has experienced BRBPR first mixed with dark stool, now all blood x 6 total. Denies abd pain/cramping. No fever/chills. No rectal pain. Denies nausea/vomiting. No h/o PUD/GERD per patient. Denies cp/sob. +fatigue.   Past Medical History  Diagnosis Date  . Renal cell carcinoma     Renal cell carcinoma  . Diverticulosis of colon with hemorrhage 2010  . Hypertension   . Adenomatous colon polyp   . Diabetes mellitus     Past Surgical History  Procedure Date  . Nephrectomy     left  . Tubal ligation   . Knee arthroscopy     x2 left  . Hand surgery     x5 right  . Abdominal hysterectomy   . Cholecystectomy     Family History  Problem Relation Age of Onset  . Colon polyps Mother   . Diabetes Maternal Grandmother   . Diabetes Father   . Colon cancer Neg Hx     History  Substance Use Topics  . Smoking status: Never Smoker   . Smokeless tobacco: Not on file  . Alcohol Use: No    OB History    Grav Para Term Preterm Abortions TAB SAB Ect Mult Living                  Review of Systems  All other systems reviewed and are negative.   except as noted HPI    Allergies  Review of patient's allergies indicates no known allergies.  Home Medications   Current Outpatient Rx  Name Route Sig Dispense Refill  . GLIMEPIRIDE 4 MG PO TABS Oral Take 4 mg by mouth daily before breakfast.      . HYOSCYAMINE SULFATE ER 0.375 MG PO TB12  TAKE 1 TABLET BY MOUTH EVERY 12 HOURS AS NEEDED FOR PAIN AND CRAMPING AND SPASMS 180 tablet 3    **Patient requests 90 day supply**  . RAMIPRIL 10 MG PO TABS Oral Take 5 mg by mouth daily.     Marland Kitchen SITAGLIPTIN-METFORMIN HCL 50-500 MG PO TABS Oral Take 1 tablet by  mouth 2 (two) times daily with a meal.     . LASIX PO Oral Take 1 tablet by mouth as needed. Unknown dose     . GLIPIZIDE 5 MG PO TABS Oral Take 5 mg by mouth daily.      Marland Kitchen HYDROCODONE-ACETAMINOPHEN 5-500 MG PO TABS Oral Take 1 tablet by mouth every 6 (six) hours as needed.        BP 136/65  Pulse 96  Temp(Src) 99.5 F (37.5 C) (Oral)  Resp 19  SpO2 100%  Physical Exam  Nursing note and vitals reviewed. Constitutional: She is oriented to person, place, and time. She appears well-developed.  HENT:  Head: Atraumatic.  Mouth/Throat: Oropharynx is clear and moist.  Eyes: Conjunctivae and EOM are normal. Pupils are equal, round, and reactive to light.  Neck: Normal range of motion. Neck supple.  Cardiovascular: Regular rhythm, normal heart sounds and intact distal pulses.        tachycardic  Pulmonary/Chest: Effort normal and breath sounds normal. No respiratory distress. She has no wheezes. She has no rales.  Abdominal:  Soft. She exhibits no distension. There is no tenderness. There is no rebound and no guarding.  Genitourinary: Guaiac positive stool.       +BRBPR no active oozing  Musculoskeletal: Normal range of motion.  Neurological: She is alert and oriented to person, place, and time.  Skin: Skin is warm and dry. No rash noted.  Psychiatric: She has a normal mood and affect.    ED Course  Procedures (including critical care time)  Labs Reviewed  CBC - Abnormal; Notable for the following:    WBC 11.2 (*)    Hemoglobin 11.1 (*)    HCT 34.1 (*)    All other components within normal limits  COMPREHENSIVE METABOLIC PANEL - Abnormal; Notable for the following:    Sodium 134 (*)    Glucose, Bld 278 (*)    BUN 30 (*)    Albumin 2.7 (*)    Total Bilirubin 0.1 (*)    GFR calc non Af Amer 62 (*)    GFR calc Af Amer 71 (*)    All other components within normal limits  DIFFERENTIAL  TYPE AND SCREEN  OCCULT BLOOD, POC DEVICE  OCCULT BLOOD, POC DEVICE  OCCULT BLOOD X 1 CARD  TO LAB, STOOL   No results found.   1. GIB (gastrointestinal bleeding)      MDM  Likely LGIB. IV access, IVF, t&S, labs. Protonix ordered given h/o ?melena before BRBPR.  Anticipate admit.  Stefano Gaul, MD  Forbes Cellar, MD 09/19/11 1836   BP 136/65  Pulse 96  Temp(Src) 99.5 F (37.5 C) (Oral)  Resp 19  SpO2 100% HR improved with IVF Discussed admit with [facility] hospitalist and GI. Dr. Elnoria Howard will see the patient in the morning.  Stefano Gaul, MD   Forbes Cellar, MD 09/19/11 [phone]

## 2011-09-19 NOTE — ED Notes (Signed)
Pt educated as to reason for Protonix bolus and gtt.Marland KitchenMarland Kitchen

## 2011-09-19 NOTE — H&P (Signed)
PCP:   Allean Found, MD     Chief Complaint:  Blood in the stools  HPI: 55 y.o female with a PMHx significant for LGIB/Diverticulosis p/w BRBPR x 6 today. No abdominal pain, n/v, fever or chills. States she has had 6 colonoscopies in the past. Last colonoscopy 2010. In the ED she was found to be tachycardic and received 1 lt NS IV bolus which brought her HR down to the 90's. BP stable. Had 1 episode of hematochezia in the ED. Received pantoprazole IV. Right now she feels well.  Allergies:  No Known Allergies    Past Medical History  Diagnosis Date  . Renal cell carcinoma     Renal cell carcinoma  . Diverticulosis of colon with hemorrhage 2010  . Hypertension   . Adenomatous colon polyp   . Diabetes mellitus     Past Surgical History  Procedure Date  . Nephrectomy     left  . Tubal ligation   . Knee arthroscopy     x2 left  . Hand surgery     x5 right  . Abdominal hysterectomy   . Cholecystectomy     Prior to Admission medications   Medication Sig Start Date End Date Taking? Authorizing Provider  amLODipine (NORVASC) 5 MG tablet Take 5 mg by mouth daily.     Yes Historical Provider, MD  ramipril (ALTACE) 10 MG tablet Take 10 mg by mouth daily.    Yes Historical Provider, MD   Medications Discontinued During This Encounter  Medication Reason  . Furosemide (LASIX PO) Patient has not taken in last 30 days  . HYDROcodone-acetaminophen (VICODIN) 5-500 MG per tablet Patient has not taken in last 30 days  . glipiZIDE (GLUCOTROL) 5 MG tablet Patient has not taken in last 30 days  . hyoscyamine (LEVBID) 0.375 MG 12 hr tablet   . sitaGLIPtan-metformin (JANUMET) 50-500 MG per tablet   . glimepiride (AMARYL) 4 MG tablet     Social History:  reports that she has never smoked. She does not have any smokeless tobacco history on file. She reports that she does not drink alcohol or use illicit drugs.  Family History  Problem Relation Age of Onset  . Colon polyps Mother    . Diabetes Maternal Grandmother   . Diabetes Father   . Colon cancer Neg Hx     Review of Systems:  Constitutional: Denies fever, chills, diaphoresis, appetite change and fatigue.  HEENT: Denies photophobia, eye pain, redness, hearing loss, ear pain, congestion, sore throat, rhinorrhea, sneezing, mouth sores, trouble swallowing, neck pain, neck stiffness and tinnitus.   Respiratory: Denies SOB, DOE, cough, chest tightness,  and wheezing.   Cardiovascular: Denies chest pain, palpitations and leg swelling.  Gastrointestinal: Denies nausea, vomiting, abdominal pain. Genitourinary: Denies dysuria, urgency, frequency, hematuria, flank pain and difficulty urinating.  Musculoskeletal: Denies myalgias, back pain, joint swelling, arthralgias and gait problem.  Skin: Denies pallor, rash and wound.  Neurological: Denies dizziness, seizures, syncope, weakness, light-headedness, numbness and headaches.  Hematological: Denies adenopathy. Easy bruising, personal or family bleeding history    Physical Exam: Blood pressure 119/68, pulse 89, temperature 98.8 F (37.1 C), temperature source Oral, resp. rate 20, SpO2 98.00%. Constitutional: Vital signs reviewed.  Patient is overweight in no acute distress and cooperative with exam. Alert and oriented x3.  Mouth: no erythema or exudates, MMM Eyes: PERRL, EOMI, conjunctivae normal, No scleral icterus.  Neck: Supple, Trachea midline normal ROM, No JVD, mass, thyromegaly, or carotid bruit present.  Cardiovascular: RRR,  S1 normal, S2 normal, no MRG, pulses symmetric and intact bilaterally. Trace LE pitting edema. Pulmonary/Chest: CTAB, no wheezes, rales, or rhonchi Abdominal: Soft. Non-tender, non-distended, bowel sounds are normal, no masses, organomegaly, or guarding present.  GU: no CVA tenderness Neurological: A&O x3, Strenght is normal and symmetric bilaterally, cranial nerve II-XII are grossly intact, no focal motor deficit, sensory intact to light  touch bilaterally.  Skin: Warm, dry and intact. No rash, cyanosis, or clubbing.    Labs on Admission:  Results for orders placed during the hospital encounter of 09/19/11 (from the past 48 hour(s))  OCCULT BLOOD, POC DEVICE     Status: Normal   Collection Time   09/19/11  4:56 PM      Component Value Range Comment   Fecal Occult Bld NEGATIVE     OCCULT BLOOD, POC DEVICE     Status: Normal   Collection Time   09/19/11  5:01 PM      Component Value Range Comment   Fecal Occult Bld POSITIVE     CBC     Status: Abnormal   Collection Time   09/19/11  5:57 PM      Component Value Range Comment   WBC 11.2 (*) 4.0 - 10.5 (K/uL)    RBC 3.98  3.87 - 5.11 (MIL/uL)    Hemoglobin 11.1 (*) 12.0 - 15.0 (g/dL)    HCT 16.1 (*) 09.6 - 46.0 (%)    MCV 85.7  78.0 - 100.0 (fL)    MCH 27.9  26.0 - 34.0 (pg)    MCHC 32.6  30.0 - 36.0 (g/dL)    RDW 04.5  40.9 - 81.1 (%)    Platelets 209  150 - 400 (K/uL)   DIFFERENTIAL     Status: Normal   Collection Time   09/19/11  5:57 PM      Component Value Range Comment   Neutrophils Relative 59  43 - 77 (%)    Neutro Abs 6.6  1.7 - 7.7 (K/uL)    Lymphocytes Relative 33  12 - 46 (%)    Lymphs Abs 3.7  0.7 - 4.0 (K/uL)    Monocytes Relative 6  3 - 12 (%)    Monocytes Absolute 0.7  0.1 - 1.0 (K/uL)    Eosinophils Relative 1  0 - 5 (%)    Eosinophils Absolute 0.1  0.0 - 0.7 (K/uL)    Basophils Relative 0  0 - 1 (%)    Basophils Absolute 0.0  0.0 - 0.1 (K/uL)   COMPREHENSIVE METABOLIC PANEL     Status: Abnormal   Collection Time   09/19/11  5:57 PM      Component Value Range Comment   Sodium 134 (*) 135 - 145 (mEq/L)    Potassium 4.3  3.5 - 5.1 (mEq/L)    Chloride 102  96 - 112 (mEq/L)    CO2 22  19 - 32 (mEq/L)    Glucose, Bld 278 (*) 70 - 99 (mg/dL)    BUN 30 (*) 6 - 23 (mg/dL)    Creatinine, Ser 9.14  0.50 - 1.10 (mg/dL)    Calcium 9.0  8.4 - 10.5 (mg/dL)    Total Protein 6.8  6.0 - 8.3 (g/dL)    Albumin 2.7 (*) 3.5 - 5.2 (g/dL)    AST 16  0 - 37  (U/L) SLIGHT HEMOLYSIS   ALT 17  0 - 35 (U/L)    Alkaline Phosphatase 93  39 - 117 (U/L)    Total  Bilirubin 0.1 (*) 0.3 - 1.2 (mg/dL)    GFR calc non Af Amer 62 (*) >90 (mL/min)    GFR calc Af Amer 71 (*) >90 (mL/min)   TYPE AND SCREEN     Status: Normal   Collection Time   09/19/11  5:57 PM      Component Value Range Comment   ABO/RH(D) O POS      Antibody Screen NEG      Sample Expiration 09/22/2011       Radiological Exams on Admission: No results found.  Assessment/Plan Hematochezia:  Hemodynamically stable. Will admit pt to SD. 2 large bore cath. NPO. Continue pantoprazole. GI consult. H/H in am.  Patient Active Problem List  Diagnoses  . DIABETES MELLITUS-TYPE II: stop oral drugs. ISS.  . OBESITY, MORBID  . HYPERTENSION: continue amlodipide/ramipril. Hold if hypotension  . DIVERTICULOSIS-COLON  . PERSONAL HX COLONIC POLYPS  .        Time Spent on Admission: 45 min.  Jonny Ruiz 09/19/2011, 8:11 PM

## 2011-09-19 NOTE — ED Notes (Signed)
Pt reports black stool progressing to bright red blood rectally since 0930 this am. Pt denies pain but reports a lot of "stomach growling."

## 2011-09-20 ENCOUNTER — Encounter (HOSPITAL_COMMUNITY): Payer: Self-pay | Admitting: *Deleted

## 2011-09-20 ENCOUNTER — Other Ambulatory Visit: Payer: Self-pay

## 2011-09-20 ENCOUNTER — Inpatient Hospital Stay (HOSPITAL_COMMUNITY): Payer: Self-pay

## 2011-09-20 DIAGNOSIS — D6489 Other specified anemias: Secondary | ICD-10-CM

## 2011-09-20 DIAGNOSIS — K5731 Diverticulosis of large intestine without perforation or abscess with bleeding: Secondary | ICD-10-CM

## 2011-09-20 LAB — GLUCOSE, CAPILLARY
Glucose-Capillary: 181 mg/dL — ABNORMAL HIGH (ref 70–99)
Glucose-Capillary: 203 mg/dL — ABNORMAL HIGH (ref 70–99)
Glucose-Capillary: 125 mg/dL — ABNORMAL HIGH (ref 70–99)
Glucose-Capillary: 125 mg/dL — ABNORMAL HIGH (ref 70–99)

## 2011-09-20 LAB — HEMOGLOBIN AND HEMATOCRIT, BLOOD
HCT: 27.9 % — ABNORMAL LOW (ref 36.0–46.0)
HCT: 26.8 % — ABNORMAL LOW (ref 36.0–46.0)
Hemoglobin: 9.1 g/dL — ABNORMAL LOW (ref 12.0–15.0)
Hemoglobin: 8.7 g/dL — ABNORMAL LOW (ref 12.0–15.0)

## 2011-09-20 LAB — OCCULT BLOOD, POC DEVICE: Fecal Occult Bld: NEGATIVE

## 2011-09-20 LAB — CBC
Hemoglobin: 9.2 g/dL — ABNORMAL LOW (ref 12.0–15.0)
MCH: 28.2 pg (ref 26.0–34.0)
RBC: 3.26 MIL/uL — ABNORMAL LOW (ref 3.87–5.11)

## 2011-09-20 MED ORDER — ACETAMINOPHEN 325 MG PO TABS: 650.0000 mg | ORAL_TABLET | Freq: Four times a day (QID) | ORAL | Status: DC | PRN

## 2011-09-20 MED ORDER — ONDANSETRON HCL 4 MG PO TABS
4.0000 mg | ORAL_TABLET | Freq: Four times a day (QID) | ORAL | Status: DC | PRN
  Filled 2011-09-20: qty 1

## 2011-09-20 MED ORDER — ONDANSETRON HCL 4 MG/2ML IJ SOLN: 4.0000 mg | Freq: Four times a day (QID) | INTRAMUSCULAR | Status: DC | PRN

## 2011-09-20 MED ORDER — AMLODIPINE BESYLATE 5 MG PO TABS
5.0000 mg | ORAL_TABLET | Freq: Every day | ORAL | Status: DC
  Administered 2011-09-21 – 2011-09-22 (×2): 5 mg via ORAL
  Filled 2011-09-20 (×3): qty 1

## 2011-09-20 MED ORDER — SODIUM CHLORIDE 0.45 % IV SOLN
INTRAVENOUS | Status: DC
  Administered 2011-09-20 – 2011-09-21 (×4): via INTRAVENOUS

## 2011-09-20 MED ORDER — ACETAMINOPHEN 650 MG RE SUPP: 650.0000 mg | Freq: Four times a day (QID) | RECTAL | Status: DC | PRN

## 2011-09-20 MED ORDER — ZOLPIDEM TARTRATE 5 MG PO TABS: 5.0000 mg | ORAL_TABLET | Freq: Every evening | ORAL | Status: DC | PRN

## 2011-09-20 MED ORDER — MORPHINE SULFATE 2 MG/ML IJ SOLN: 0.5000 mg | INTRAMUSCULAR | Status: DC | PRN

## 2011-09-20 MED ORDER — OXYCODONE HCL 5 MG PO TABS
5.0000 mg | ORAL_TABLET | ORAL | Status: DC | PRN
  Administered 2011-09-20 (×2): 5 mg via ORAL
  Filled 2011-09-20 (×2): qty 1

## 2011-09-20 MED ORDER — RAMIPRIL 10 MG PO CAPS
10.0000 mg | ORAL_CAPSULE | Freq: Every day | ORAL | Status: DC
  Administered 2011-09-21 – 2011-09-22 (×2): 10 mg via ORAL
  Filled 2011-09-20 (×3): qty 1

## 2011-09-20 MED ORDER — INSULIN ASPART 100 UNIT/ML ~~LOC~~ SOLN
0.0000 [IU] | SUBCUTANEOUS | Status: DC
  Administered 2011-09-20 (×3): 1 [IU] via SUBCUTANEOUS
  Administered 2011-09-20: 2 [IU] via SUBCUTANEOUS
  Administered 2011-09-20: 3 [IU] via SUBCUTANEOUS
  Administered 2011-09-21: 2 [IU] via SUBCUTANEOUS
  Administered 2011-09-21 – 2011-09-22 (×5): 1 [IU] via SUBCUTANEOUS
  Administered 2011-09-22: 2 [IU] via SUBCUTANEOUS
  Filled 2011-09-20: qty 3

## 2011-09-20 NOTE — Consult Note (Signed)
Patient seen, examined and I agree with the above documentation, including the assessment and plan.  Hematochezia likely as result of recurrent diverticular bleeding.  This is her 3rd hospitalization for this problem. She is stable for now, but may require pRBC transfusion.  Cycle HCTs If evidence of rebleeding, then rec tagged RBC study with angio if positive. We did briefly discuss colectomy given that fact this is her 3rd bleeding in < 3 yrs all requiring hospitalization.  Given the pan-nature of her dx, she would likely need subtotal colectomy. She is very hesitant to consider this at present, but will think about it.  We can discuss this again later, and this would likely be elective.

## 2011-09-20 NOTE — Progress Notes (Signed)
Subjective: Mild left lower abdominal cramps.  Had two bloody bowel movements this am. Denies any nausea or vomiting.  Objective: Vital signs in last 24 hours: Filed Vitals:   09/20/11 0700 09/20/11 0800 09/20/11 0900 09/20/11 1000  BP:  149/68    Pulse: 89 89 85 88  Temp:      TempSrc:      Resp: 15 18 16 15   Height:      Weight:      SpO2: 100% 100% 98% 99%   Weight change:   Intake/Output Summary (Last 24 hours) at 09/20/11 1109 Last data filed at 09/20/11 1100  Gross per 24 hour  Intake    900 ml  Output    931 ml  Net    -31 ml   Physical Exam:   General Appearance:    Alert, cooperative, no distress, appears stated age  Lungs:     Clear to auscultation bilaterally, respirations unlabored   Heart:    Regular rate and rhythm, S1 and S2 normal, no murmur, rub   or gallop  Abdomen:     Soft, mildly  Tender left lower quadrnat, bowel sounds active all four quadrants,    no masses, no organomegaly  Extremities:   Extremities normal, atraumatic, no cyanosis or edema  Pulses:   2+ and symmetric all extremities           Lab Results:  Results for orders placed during the hospital encounter of 09/19/11 (from the past 24 hour(s))  OCCULT BLOOD, POC DEVICE     Status: Normal   Collection Time   09/19/11  4:56 PM      Component Value Range   Fecal Occult Bld NEGATIVE    OCCULT BLOOD, POC DEVICE     Status: Normal   Collection Time   09/19/11  5:01 PM      Component Value Range   Fecal Occult Bld NEGATIVE     Comment 1       Value: TEST ENTERED INCORRECTLY. REENTERED AND CHARGE CREDTED.  CBC     Status: Abnormal   Collection Time   09/19/11  5:57 PM      Component Value Range   WBC 11.2 (*) 4.0 - 10.5 (K/uL)   RBC 3.98  3.87 - 5.11 (MIL/uL)   Hemoglobin 11.1 (*) 12.0 - 15.0 (g/dL)   HCT 16.1 (*) 09.6 - 46.0 (%)   MCV 85.7  78.0 - 100.0 (fL)   MCH 27.9  26.0 - 34.0 (pg)   MCHC 32.6  30.0 - 36.0 (g/dL)   RDW 04.5  40.9 - 81.1 (%)   Platelets 209  150 - 400  (K/uL)  DIFFERENTIAL     Status: Normal   Collection Time   09/19/11  5:57 PM      Component Value Range   Neutrophils Relative 59  43 - 77 (%)   Neutro Abs 6.6  1.7 - 7.7 (K/uL)   Lymphocytes Relative 33  12 - 46 (%)   Lymphs Abs 3.7  0.7 - 4.0 (K/uL)   Monocytes Relative 6  3 - 12 (%)   Monocytes Absolute 0.7  0.1 - 1.0 (K/uL)   Eosinophils Relative 1  0 - 5 (%)   Eosinophils Absolute 0.1  0.0 - 0.7 (K/uL)   Basophils Relative 0  0 - 1 (%)   Basophils Absolute 0.0  0.0 - 0.1 (K/uL)  COMPREHENSIVE METABOLIC PANEL     Status: Abnormal   Collection Time  09/19/11  5:57 PM      Component Value Range   Sodium 134 (*) 135 - 145 (mEq/L)   Potassium 4.3  3.5 - 5.1 (mEq/L)   Chloride 102  96 - 112 (mEq/L)   CO2 22  19 - 32 (mEq/L)   Glucose, Bld 278 (*) 70 - 99 (mg/dL)   BUN 30 (*) 6 - 23 (mg/dL)   Creatinine, Ser 1.61  0.50 - 1.10 (mg/dL)   Calcium 9.0  8.4 - 09.6 (mg/dL)   Total Protein 6.8  6.0 - 8.3 (g/dL)   Albumin 2.7 (*) 3.5 - 5.2 (g/dL)   AST 16  0 - 37 (U/L)   ALT 17  0 - 35 (U/L)   Alkaline Phosphatase 93  39 - 117 (U/L)   Total Bilirubin 0.1 (*) 0.3 - 1.2 (mg/dL)   GFR calc non Af Amer 62 (*) >90 (mL/min)   GFR calc Af Amer 71 (*) >90 (mL/min)  TYPE AND SCREEN     Status: Normal   Collection Time   09/19/11  5:57 PM      Component Value Range   ABO/RH(D) O POS     Antibody Screen NEG     Sample Expiration 09/22/2011    MRSA PCR SCREENING     Status: Normal   Collection Time   09/19/11 11:47 PM      Component Value Range   MRSA by PCR NEGATIVE  NEGATIVE   GLUCOSE, CAPILLARY     Status: Abnormal   Collection Time   09/20/11  2:22 AM      Component Value Range   Glucose-Capillary 181 (*) 70 - 99 (mg/dL)   Comment 1 Documented in Chart     Comment 2 Notify RN    CBC     Status: Abnormal   Collection Time   09/20/11  3:18 AM      Component Value Range   WBC 9.7  4.0 - 10.5 (K/uL)   RBC 3.26 (*) 3.87 - 5.11 (MIL/uL)   Hemoglobin 9.2 (*) 12.0 - 15.0 (g/dL)   HCT  04.5 (*) 40.9 - 46.0 (%)   MCV 85.3  78.0 - 100.0 (fL)   MCH 28.2  26.0 - 34.0 (pg)   MCHC 33.1  30.0 - 36.0 (g/dL)   RDW 81.1  91.4 - 78.2 (%)   Platelets 178  150 - 400 (K/uL)  GLUCOSE, CAPILLARY     Status: Abnormal   Collection Time   09/20/11  3:46 AM      Component Value Range   Glucose-Capillary 203 (*) 70 - 99 (mg/dL)   Comment 1 Documented in Chart     Comment 2 Notify RN    GLUCOSE, CAPILLARY     Status: Abnormal   Collection Time   09/20/11  7:33 AM      Component Value Range   Glucose-Capillary 160 (*) 70 - 99 (mg/dL)   Comment 1 Documented in Chart     Comment 2 Notify RN      t2@Micro  Results: Recent Results (from the past 240 hour(s))  MRSA PCR SCREENING     Status: Normal   Collection Time   09/19/11 11:47 PM      Component Value Range Status Comment   MRSA by PCR NEGATIVE  NEGATIVE  Final    Studies/Results: No results found. Medications: reviewed.  Scheduled Meds:   . amLODipine  5 mg Oral Daily  . insulin aspart  0-9 Units Subcutaneous Q4H  . pantoprazole (  PROTONIX) IV  80 mg Intravenous Once  . ramipril  10 mg Oral Daily  . DISCONTD: sodium chloride   Intravenous STAT  . DISCONTD: sodium chloride  1,000 mL Intravenous Once  . DISCONTD: sodium chloride  1,000 mL Intravenous Once   Continuous Infusions:   . sodium chloride 100 mL/hr at 09/20/11 1100  . pantoprozole (PROTONIX) infusion 8 mg/hr (09/20/11 1100)   PRN Meds:.acetaminophen, acetaminophen, morphine, ondansetron (ZOFRAN) IV, ondansetron, oxyCODONE, zolpidem  1. Hematochezia: h&h stable at 9. Will start the patient on clear liquid diet. Gastroenterology consult from [facility] called for further evaluation.  2. Diabetes Mellitus: pt on sliding scale insulin. 3. Hypertension : controlled.    LOS: 1 day   Meryn Sarracino 09/20/2011, 11:09 AM

## 2011-09-20 NOTE — Progress Notes (Signed)
UR complete 

## 2011-09-20 NOTE — Consult Note (Signed)
Referring Provider: [facility]  Primary Care Physician:  Allean Found, MD Primary Gastroenterologist:  Dr.Gessner  Reason for Consultation:  Acute GI bleed, history of recurrent diverticular bleeding  HPI: [name] is a 55 y.o. female known to Dr. Leone Payor with history of recurrent diverticular bleeding. She was last hospitalized for diverticular bleed in February of 2011, at that time she required supportive management only and no transfusions. She had a major diverticular bleed in February of 2010 at which time she required multiple units of packed RBCs. She had colonoscopy at that time by Dr. Christella Hartigan with finding of moderate pandiverticulosis and was found to have a bleeding diverticulum at the hepatic flexure as was injected with epinephrine and 2 endoclips were placed. Unfortunately after that procedure she continued to have oozing and underwent a mesenteric angiogram with embolization of the right colic artery. She has not had any further bleeding since that time.  Patient had an acute onset of bleeding yesterday morning about 9:30 AM, had had a normal bowel movement earlier in the morning and then passed dark stool and bright red blood. She had a total of 6 bowel movements the next 4 or 5 hours prior to coming onto the hospital. She started to develop some weakness and lightheadedness. Since admission she had one bowel movement last evening again containing of black stool and bright red blood and then has had 2 bowel movements this morning the last about 6 AM similar appearance She is no complaint of nausea or vomiting, no abdominal pain though she has had some cramping prior to bowel movements. She is not on any regular aspirin or NSAIDs at home and no blood thinners.  Her hemoglobin on admission was 11.1, her baseline from check in February of 2012 was about 12.7.   Past Medical History  Diagnosis Date  . Diverticulosis of colon with hemorrhage 2010  . Hypertension    . Adenomatous colon polyp   . Diabetes mellitus   . Renal cell carcinoma 2004    Renal cell carcinoma  . Blood transfusion     Past Surgical History  Procedure Date  . Nephrectomy     left  . Tubal ligation   . Knee arthroscopy     x2 left  . Hand surgery     x5 right  . Abdominal hysterectomy   . Cholecystectomy     Prior to Admission medications   Medication Sig Start Date End Date Taking? Authorizing Provider  amLODipine (NORVASC) 5 MG tablet Take 5 mg by mouth daily.     Yes Historical Provider, MD  ramipril (ALTACE) 10 MG tablet Take 10 mg by mouth daily.    Yes Historical Provider, MD    Current Facility-Administered Medications  Medication Dose Route Frequency Provider Last Rate Last Dose  . 0.45 % sodium chloride infusion   Intravenous Continuous Monica Ruiz, MD 100 mL/hr at 09/20/11 1113    . acetaminophen (TYLENOL) tablet 650 mg  650 mg Oral Q6H PRN Monica Ruiz, MD       Or  . acetaminophen (TYLENOL) suppository 650 mg  650 mg Rectal Q6H PRN Monica Ruiz, MD      . amLODipine (NORVASC) tablet 5 mg  5 mg Oral Daily Monica Ruiz, MD      . insulin aspart (novoLOG) injection 0-9 Units  0-9 Units Subcutaneous Q4H Monica Ruiz, MD   2 Units at 09/20/11 0914  . morphine 2 MG/ML injection 0.5 mg  0.5 mg Intravenous Q4H PRN Monica Lowenstein  Jordan Hawks, MD      . ondansetron [facility]) tablet 4 mg  4 mg Oral Q6H PRN Monica Ruiz, MD       Or  . ondansetron [facility]) injection 4 mg  4 mg Intravenous Q6H PRN Monica Ruiz, MD      . oxyCODONE (Oxy IR/ROXICODONE) immediate release tablet 5 mg  5 mg Oral Q4H PRN Monica Ruiz, MD   5 mg at 09/20/11 0800  . pantoprazole (PROTONIX) 80 mg in sodium chloride 0.9 % 100 mL IVPB  80 mg Intravenous Once Monica Cellar, MD   80 mg at 09/19/11 1800  . pantoprazole (PROTONIX) 80 mg in sodium chloride 0.9 % 250 mL infusion  8 mg/hr Intravenous Continuous Monica Cellar, MD 25 mL/hr at 09/20/11 1100 8 mg/hr at 09/20/11 1100  .  ramipril (ALTACE) capsule 10 mg  10 mg Oral Daily Monica Ruiz, MD      . zolpidem [facility]) tablet 5 mg  5 mg Oral QHS PRN Monica Ruiz, MD      . DISCONTD: 0.9 %  sodium chloride infusion   Intravenous STAT Monica Cellar, MD      . DISCONTD: sodium chloride 0.9 % bolus 1,000 mL  1,000 mL Intravenous Once Monica Cellar, MD   1,000 mL at 09/19/11 1700  . DISCONTD: sodium chloride 0.9 % bolus 1,000 mL  1,000 mL Intravenous Once Monica Cellar, MD   1,000 mL at 09/19/11 1700    Allergies as of 09/19/2011  . (No Known Allergies)    Family History  Problem Relation Age of Onset  . Colon polyps Mother   . Diabetes Maternal Grandmother   . Diabetes Father   . Colon cancer Neg Hx     History   Social History  . Marital Status: Divorced    Spouse Name: N/A    Number of Children: 3  . Years of Education: N/A   Occupational History  . Training and development officer    Social History Main Topics  . Smoking status: Never Smoker   . Smokeless tobacco: Not on file  . Alcohol Use: No  . Drug Use: No  . Sexually Active: Not on file   Other Topics Concern  . Not on file   Social History Narrative  . No narrative on file    Review of Systems: Gen: Denies any fever, chills, sweats, anorexia, fatigue,  malaise, weight loss, and sleep disorder CV: Denies chest pain, angina, palpitations, syncope, orthopnea, PND, peripheral edema, and claudication. Resp: Denies dyspnea at rest, dyspnea with exercise, cough, sputum, wheezing, coughing up blood, a GU : Denies urinary burning, blood in urine, urinary frequency, urinary hesitancy, nocturnal urination, and urinary incontinence.GI; as above MS: Denies joint pain, limitation of movement, and swelling, stiffness, low back pain, extremity pain. Denies muscle weakness, cramps, atrophy.  Derm: Denies rash, itching, dry skin, hives, moles, warts, or unhealing ulcers.  Psych: Denies depression, anxiety, memory loss, suicidal ideation, hallucinations,  paranoia, and confusion. Heme: Denies bruising, bleeding, and enlarged lymph nodes. Neuro:  Denies any headaches, dizziness, paresthesias. Endo:  Denies any problems with DM, thyroid, adrenal function.  Physical Exam: Vital signs in last 24 hours: Temp:  [98 F (36.7 C)-99.5 F (37.5 C)] 98 F (36.7 C) (11/06 0400) Pulse Rate:  [85-133] 88  (11/06 1000) Resp:  [14-22] 15  (11/06 1000) BP: (119-153)/(61-89) 149/68 mmHg (11/06 0800) SpO2:  [98 %-100 %] 99 % (11/06 1000) Weight:  [113.6 kg (250 lb 7.1 oz)] 250 lb 7.1 oz (113.6 kg) (  11/05 2339) Last BM Date: 09/20/11 General:   Alert,  Well-developed, well-nourished, pleasant and cooperative in NAD Head:  Normocephalic and atraumatic. Eyes:  Sclera clear, no icterus.   Conjunctiva pink. Ears:  Normal auditory acuity. Nose:  No deformity, discharge,  or lesions. Mouth:  No deformity or lesions.  Oropharynx pink & moist. Neck:  Supple; no masses or thyromegaly. Lungs:  Clear throughout to auscultation.   No wheezes, crackles, or rhonchi. No acute distress. Heart:  Regular rate and rhythm; no murmurs, clicks, rubs,  or gallops. Abdomen:  Soft, large, nontender and nondistended. No masses, hepatosplenomegaly or hernias noted. Normal bowel sounds, without guarding, and without rebound.   Rectal:  Not done bloody stools witnessed per nursing  Msk:  Symmetrical without gross deformities. Normal posture. Pulses:  Normal pulses noted. Extremities:  Without clubbing or edema. Neurologic:  Alert and  oriented x4;  grossly normal neurologically. Skin:  Intact without significant lesions or rashes. . Psych:  Alert and cooperative. Normal mood and affect.  Intake/Output from previous day: 11/05 0701 - 11/06 0700 In: 500 [I.V.:500] Out: 380 [Urine:380] Intake/Output this shift: Total I/O In: 400 [I.V.:400] Out: 551 [Urine:550; Stool:1]  Lab Results:  [facility] 09/20/11 0318 09/19/11 1757  WBC 9.7 11.2*  HGB 9.2* 11.1*  HCT 27.8* 34.1*    PLT 178 209   BMET  Basename 09/19/11 1757  NA 134*  K 4.3  CL 102  CO2 22  GLUCOSE 278*  BUN 30*  CREATININE 1.02  CALCIUM 9.0   LFT  Basename 09/19/11 1757  PROT 6.8  ALBUMIN 2.7*  AST 16  ALT 17  ALKPHOS 93  BILITOT 0.1*  BILIDIR --  IBILI --   PT/INR      Studies/Results: No results found.  Impression: #61 55 year old female with recurrent lower GI bleed consistent with recurrent diverticular bleed. Patient is hemodynamically stable last hemoglobin 9.2. #2 History of major to diverticular bleed 2010 requiring colonoscopy with epi and endoclipped and followed by angiogram with embolization of the left colic artery or a bleeding site at the hepatic flexure #3 anemia secondary to blood loss #4 history of renal cell CA status post nephrectomy #5 history of hypertension History of adult-onset diabetes mellitus  Plan: Agree with starting clear liquid diet Serial H&H's every 6 hours and transfuse for hemoglobin 8 or less, keep 2 units of packed RBCs available ahead Check ProTime INR Would not anticipate colonoscopy this admission , if she has persistent active bleeding would pursue nuclear medicine bleeding scan and then possibly arteriogram. We will follow with you, thank you for the consult.   [name]  09/20/2011, 12:00 PM

## 2011-09-21 ENCOUNTER — Encounter (HOSPITAL_COMMUNITY): Payer: Self-pay | Admitting: *Deleted

## 2011-09-21 DIAGNOSIS — Z905 Acquired absence of kidney: Secondary | ICD-10-CM | POA: Insufficient documentation

## 2011-09-21 LAB — CBC
HCT: 26.8 % — ABNORMAL LOW (ref 36.0–46.0)
Hemoglobin: 8.4 g/dL — ABNORMAL LOW (ref 12.0–15.0)
MCHC: 31.3 g/dL (ref 30.0–36.0)
MCV: 87.3 fL (ref 78.0–100.0)
RDW: 15 % (ref 11.5–15.5)

## 2011-09-21 LAB — GLUCOSE, CAPILLARY
Glucose-Capillary: 109 mg/dL — ABNORMAL HIGH (ref 70–99)
Glucose-Capillary: 131 mg/dL — ABNORMAL HIGH (ref 70–99)
Glucose-Capillary: 145 mg/dL — ABNORMAL HIGH (ref 70–99)
Glucose-Capillary: 117 mg/dL — ABNORMAL HIGH (ref 70–99)

## 2011-09-21 LAB — COMPREHENSIVE METABOLIC PANEL
Albumin: 2.5 g/dL — ABNORMAL LOW (ref 3.5–5.2)
BUN: 10 mg/dL (ref 6–23)
Chloride: 104 mEq/L (ref 96–112)
Creatinine, Ser: 0.88 mg/dL (ref 0.50–1.10)
GFR calc Af Amer: 85 mL/min — ABNORMAL LOW (ref >90)
Glucose, Bld: 149 mg/dL — ABNORMAL HIGH (ref 70–99)
Total Bilirubin: 0.2 mg/dL — ABNORMAL LOW (ref 0.3–1.2)
Total Protein: 6.1 g/dL (ref 6.0–8.3)

## 2011-09-21 LAB — HEMOGLOBIN AND HEMATOCRIT, BLOOD: HCT: 27.6 % — ABNORMAL LOW (ref 36.0–46.0)

## 2011-09-21 NOTE — Progress Notes (Deleted)
Haldol 5 mg IV given @ 0830 am on 09/21/11.  Unable to chart dose on MAR.

## 2011-09-21 NOTE — Progress Notes (Signed)
Patient seen and I agree with the above documentation, including the assessment and plan.  

## 2011-09-21 NOTE — Progress Notes (Signed)
. Subjective:  Patient feels fine today.  She is tolerating the clear liquid diet.  No more bloody stool.  No abdominal pain.   Objective: Vital signs in last 24 hours: Filed Vitals:   09/20/11 2011 09/21/11 0000 09/21/11 0400 09/21/11 0640  BP:   151/61 138/58  Pulse:  85 80 79  Temp: 97.9 F (36.6 C) 98.5 F (36.9 C) 98.3 F (36.8 C)   TempSrc: Oral Oral Oral   Resp:  16 16 15   Height:      Weight:    100.6 kg (221 lb 12.5 oz)  SpO2:  100% 99% 100%   Weight change: -13 kg (-28 lb 10.6 oz)  Intake/Output Summary (Last 24 hours) at 09/21/11 0831 Last data filed at 09/21/11 2130  Gross per 24 hour  Intake   3500 ml  Output   2600 ml  Net    900 ml   Physical Exam:   General Appearance:    Alert, cooperative, no distress, appears stated age  Lungs:     Clear to auscultation bilaterally, respirations unlabored   Heart:    Regular rate and rhythm, S1 and S2 normal, no murmur, rub   or gallop  Abdomen:     Soft, mildly  Tender left lower quadrnat, bowel sounds active all four quadrants,    no masses, no organomegaly  Extremities:   Extremities normal, atraumatic, no cyanosis or edema  Pulses:   2+ and symmetric all extremities           Lab Results:  Results for orders placed during the hospital encounter of 09/19/11 (from the past 24 hour(s))  GLUCOSE, CAPILLARY     Status: Abnormal   Collection Time   09/20/11 11:41 AM      Component Value Range   Glucose-Capillary 125 (*) 70 - 99 (mg/dL)   Comment 1 Documented in Chart     Comment 2 Notify RN    PROTIME-INR     Status: Normal   Collection Time   09/20/11 12:30 PM      Component Value Range   Prothrombin Time 13.7  11.6 - 15.2 (seconds)   INR 1.03  0.00 - 1.49   HEMOGLOBIN AND HEMATOCRIT, BLOOD     Status: Abnormal   Collection Time   09/20/11 12:30 PM      Component Value Range   Hemoglobin 9.1 (*) 12.0 - 15.0 (g/dL)   HCT 86.5 (*) 78.4 - 46.0 (%)  GLUCOSE, CAPILLARY     Status: Abnormal   Collection  Time   09/20/11  3:32 PM      Component Value Range   Glucose-Capillary 125 (*) 70 - 99 (mg/dL)   Comment 1 Documented in Chart     Comment 2 Notify RN    HEMOGLOBIN AND HEMATOCRIT, BLOOD     Status: Abnormal   Collection Time   09/20/11  4:11 PM      Component Value Range   Hemoglobin 8.7 (*) 12.0 - 15.0 (g/dL)   HCT 69.6 (*) 29.5 - 46.0 (%)  GLUCOSE, CAPILLARY     Status: Abnormal   Collection Time   09/20/11  7:27 PM      Component Value Range   Glucose-Capillary 125 (*) 70 - 99 (mg/dL)   Comment 1 Documented in Chart     Comment 2 Notify RN    HEMOGLOBIN AND HEMATOCRIT, BLOOD     Status: Abnormal   Collection Time   09/20/11  8:25 PM  Component Value Range   Hemoglobin 8.4 (*) 12.0 - 15.0 (g/dL)   HCT 16.1 (*) 09.6 - 46.0 (%)  HEMOGLOBIN AND HEMATOCRIT, BLOOD     Status: Abnormal   Collection Time   09/21/11 12:35 AM      Component Value Range   Hemoglobin 8.1 (*) 12.0 - 15.0 (g/dL)   HCT 04.5 (*) 40.9 - 46.0 (%)  CBC     Status: Abnormal   Collection Time   09/21/11  3:10 AM      Component Value Range   WBC 7.7  4.0 - 10.5 (K/uL)   RBC 3.07 (*) 3.87 - 5.11 (MIL/uL)   Hemoglobin 8.4 (*) 12.0 - 15.0 (g/dL)   HCT 81.1 (*) 91.4 - 46.0 (%)   MCV 87.3  78.0 - 100.0 (fL)   MCH 27.4  26.0 - 34.0 (pg)   MCHC 31.3  30.0 - 36.0 (g/dL)   RDW 78.2  95.6 - 21.3 (%)   Platelets 167  150 - 400 (K/uL)  COMPREHENSIVE METABOLIC PANEL     Status: Abnormal   Collection Time   09/21/11  3:10 AM      Component Value Range   Sodium 135  135 - 145 (mEq/L)   Potassium 3.7  3.5 - 5.1 (mEq/L)   Chloride 104  96 - 112 (mEq/L)   CO2 24  19 - 32 (mEq/L)   Glucose, Bld 149 (*) 70 - 99 (mg/dL)   BUN 10  6 - 23 (mg/dL)   Creatinine, Ser 0.86  0.50 - 1.10 (mg/dL)   Calcium 8.4  8.4 - 57.8 (mg/dL)   Total Protein 6.1  6.0 - 8.3 (g/dL)   Albumin 2.5 (*) 3.5 - 5.2 (g/dL)   AST 15  0 - 37 (U/L)   ALT 16  0 - 35 (U/L)   Alkaline Phosphatase 77  39 - 117 (U/L)   Total Bilirubin 0.2 (*) 0.3 -  1.2 (mg/dL)   GFR calc non Af Amer 74 (*) >90 (mL/min)   GFR calc Af Amer 85 (*) >90 (mL/min)  GLUCOSE, CAPILLARY     Status: Abnormal   Collection Time   09/21/11  4:25 AM      Component Value Range   Glucose-Capillary 146 (*) 70 - 99 (mg/dL)   Comment 1 Notify RN     Comment 2 Documented in Chart    GLUCOSE, CAPILLARY     Status: Abnormal   Collection Time   09/21/11  7:39 AM      Component Value Range   Glucose-Capillary 137 (*) 70 - 99 (mg/dL)   Comment 1 Notify RN     Comment 2 Documented in Chart      t2@Micro  Results: Recent Results (from the past 240 hour(s))  MRSA PCR SCREENING     Status: Normal   Collection Time   09/19/11 11:47 PM      Component Value Range Status Comment   MRSA by PCR NEGATIVE  NEGATIVE  Final    Studies/Results: No results found. Medications: reviewed.  Scheduled Meds:    . amLODipine  5 mg Oral Daily  . insulin aspart  0-9 Units Subcutaneous Q4H  . ramipril  10 mg Oral Daily   Continuous Infusions:    . pantoprozole (PROTONIX) infusion 8 mg/hr (09/21/11 0600)  . DISCONTD: sodium chloride 100 mL/hr at 09/21/11 0716   PRN Meds:.acetaminophen, acetaminophen, morphine, ondansetron (ZOFRAN) IV, ondansetron, oxyCODONE, zolpidem  1. Hematochezia: h&h stable greater than 8. Will advance diet to  full liquids. Gastroenterology consulted [place].Will transfer to tele.  2. Diabetes Mellitus: Continue on sliding scale insulin. 3. Hypertension : controlled.  4.Disposition: As per GI rec.   LOS: 2 days   Earlene Plater MD, [place] 09/21/2011, 8:31 AM

## 2011-09-21 NOTE — Progress Notes (Addendum)
  [facility] Progress Note  Subjective: [name] feels well this morning, she has not had any bloody bowel movements since yesterday morning. She states she had some abdominal cramping last evening which has resolved. She has been up to the bathroom and denies lightheadedness. She is hungry, and looking forward to eating  Objective: Vital signs in last 24 hours: Temp:  [97.9 F (36.6 C)-98.9 F (37.2 C)] 98.7 F (37.1 C) (11/07 0800) Pulse Rate:  [79-111] 81  (11/07 0800) Resp:  [14-22] 19  (11/07 0800) BP: (111-151)/(47-73) 115/73 mmHg (11/07 0800) SpO2:  [99 %-100 %] 100 % (11/07 0800) FiO2 (%):  [0.4 %] 0.4 % (11/07 0640) Weight:  [100.6 kg (221 lb 12.5 oz)] 221 lb 12.5 oz (100.6 kg) (11/07 0640) Last BM Date: 09/20/11 General:   Alert,  Well-developed, well-nourished, pleasant and cooperative in NAD Head:  Normocephalic and atraumatic. Eyes:  Sclera clear, no icterus.   Conjunctiva pink. Mouth:  No deformity or lesions, dentition normal.  Heart:  Regular rate and rhythm; no murmurs, clicks, rubs,  or gallops. Abdomen:  Soft, nontender and nondistended. No masses, hepatosplenomegaly or hernias noted. Normal bowel sounds, without guarding, and without rebound.    Extremities:  Without clubbing or edema. Neurologic:  Alert and  oriented x4;  grossly normal neurologically. Skin:  Intact without significant lesions or rashes.  Psych:  Alert and cooperative. Normal mood and affect.  Intake/Output from previous day: 11/06 0701 - 11/07 0700 In: 3750 [P.O.:1000; I.V.:2750] Out: 2851 [Urine:2850; Stool:1] Intake/Output this shift: Total I/O In: 375 [P.O.:200; I.V.:175] Out: -   Lab Results:  Basename 09/21/11 0310 09/21/11 0035 09/20/11 2025 09/20/11 0318 09/19/11 1757  WBC 7.7 -- -- 9.7 11.2*  HGB 8.4* 8.1* 8.4* -- --  HCT 26.8* 25.3* 25.7* -- --  PLT 167 -- -- 178 209   BMET  Basename 09/21/11 0310 09/19/11 1757  NA 135 134*  K 3.7 4.3  CL 104 102  CO2  24 22  GLUCOSE 149* 278*  BUN 10 30*  CREATININE 0.88 1.02  CALCIUM 8.4 9.0   LFT  Basename 09/21/11 0310  PROT 6.1  ALBUMIN 2.5*  AST 15  ALT 16  ALKPHOS 77  BILITOT 0.2*  BILIDIR --  IBILI --   PT/INR  Basename 09/20/11 1230  LABPROT 13.7  INR 1.03         Assessment/Plan; # 66   55 year old female with acute recurrent diverticular bleed in the setting of pan diverticular disease area She has not had any active bleeding in the past 24 hours. Patient is to be transferred to telemetry, will increase activity and advance her diet. Continue to follow H&H for another 24 hours and if she remains stable without active bleeding she may be able to be discharged home.  #2 anemia, secondary to acute blood loss on hemoglobin stable at 8.4 and patient has not required transfusion. We'll continue to monitor every 12 hours        LOS: 2 days   Arma Reining  09/21/2011, 11:10 AM

## 2011-09-22 ENCOUNTER — Other Ambulatory Visit: Payer: Self-pay

## 2011-09-22 DIAGNOSIS — K922 Gastrointestinal hemorrhage, unspecified: Secondary | ICD-10-CM

## 2011-09-22 DIAGNOSIS — D6489 Other specified anemias: Secondary | ICD-10-CM

## 2011-09-22 DIAGNOSIS — K5731 Diverticulosis of large intestine without perforation or abscess with bleeding: Secondary | ICD-10-CM

## 2011-09-22 LAB — GLUCOSE, CAPILLARY
Glucose-Capillary: 139 mg/dL — ABNORMAL HIGH (ref 70–99)
Glucose-Capillary: 141 mg/dL — ABNORMAL HIGH (ref 70–99)
Glucose-Capillary: 177 mg/dL — ABNORMAL HIGH (ref 70–99)

## 2011-09-22 LAB — CBC
HCT: 29.6 % — ABNORMAL LOW (ref 36.0–46.0)
Hemoglobin: 9.4 g/dL — ABNORMAL LOW (ref 12.0–15.0)
MCH: 27.6 pg (ref 26.0–34.0)
MCV: 86.8 fL (ref 78.0–100.0)
RBC: 3.41 MIL/uL — ABNORMAL LOW (ref 3.87–5.11)
WBC: 7.6 10*3/uL (ref 4.0–10.5)

## 2011-09-22 LAB — BASIC METABOLIC PANEL
BUN: 6 mg/dL (ref 6–23)
CO2: 26 mEq/L (ref 19–32)
Calcium: 9.3 mg/dL (ref 8.4–10.5)
Chloride: 104 mEq/L (ref 96–112)
Creatinine, Ser: 0.86 mg/dL (ref 0.50–1.10)
Glucose, Bld: 145 mg/dL — ABNORMAL HIGH (ref 70–99)

## 2011-09-22 MED ORDER — GLIMEPIRIDE 4 MG PO TABS: 4.0000 mg | ORAL_TABLET | Freq: Every day | ORAL | Status: DC

## 2011-09-22 MED ORDER — PANTOPRAZOLE SODIUM 40 MG PO TBEC: 40.0000 mg | DELAYED_RELEASE_TABLET | Freq: Every day | ORAL | Status: DC

## 2011-09-22 MED ORDER — HYOSCYAMINE SULFATE ER 0.375 MG PO TB12: 0.3750 mg | ORAL_TABLET | Freq: Two times a day (BID) | ORAL | Status: DC | PRN

## 2011-09-22 MED ORDER — SITAGLIPTIN PHOS-METFORMIN HCL 50-500 MG PO TABS: 1.0000 | ORAL_TABLET | Freq: Two times a day (BID) | ORAL | Status: DC

## 2011-09-22 NOTE — Progress Notes (Signed)
  ctive: [name] is doing well, last bm was yesterday morning-black. No stools since. She has been ambulating, feels her heart rate go up but no dizziness etc. Hgb is stable at 9.4  Objective: Vital signs in last 24 hours: Temp:  [98.3 F (36.8 C)-99 F (37.2 C)] 98.3 F (36.8 C) (11/08 0645) Pulse Rate:  [79-93] 79  (11/08 0645) Resp:  [16-47] 16  (11/08 0645) BP: (130-152)/(53-80) 147/75 mmHg (11/08 0645) SpO2:  [100 %] 100 % (11/08 0645) Weight:  [111.7 kg (246 lb 4.1 oz)] 246 lb 4.1 oz (111.7 kg) (11/07 2004) Last BM Date: 09/21/11 General:   Alert,  Well-developed, well-nourished, pleasant and cooperative in NAD Head:  Normocephalic and atraumatic. Eyes:  Sclera clear, no icterus.   Conjunctiva pink. Mouth:  No deformity or lesions, dentition normal. Neck:  Supple; no masses or thyromegaly. Heart:  Regular rate and rhythm; no murmurs, clicks, rubs,  or gallops. Abdomen:  Soft,  Nontender, Bs+  Msk:  Symmetrical without gross deformities. Normal posture.  Extremities:  Without clubbing or edema. Neurologic:  Alert and  oriented x4;  grossly normal neurologically. Skin:  Intact without significant lesions or rashes.  Psych:  Alert and cooperative. Normal mood and affect.  Intake/Output from previous day: 11/07 0701 - 11/08 0700 In: 1482.9 [P.O.:520; I.V.:962.9] Out: 1802 [Urine:1800; Stool:2] Intake/Output this shift:    Lab Results:  Basename 09/22/11 0805 09/22/11 0500 09/21/11 2030 09/21/11 0310 09/20/11 0318  WBC -- 7.6 -- 7.7 9.7  HGB 9.4* 9.4* 8.9* -- --  HCT 29.0* 29.6* 27.6* -- --  PLT -- 188 -- 167 178   BMET  Basename 09/22/11 0500 09/21/11 0310 09/19/11 1757  NA 138 135 134*  K 3.5 3.7 4.3  CL 104 104 102  CO2 26 24 22   GLUCOSE 145* 149* 278*  BUN 6 10 30*  CREATININE 0.86 0.88 1.02  CALCIUM 9.3 8.4 9.0   LFT  Basename 09/21/11 0310  PROT 6.1  ALBUMIN 2.5*  AST 15  ALT 16  ALKPHOS 77  BILITOT 0.2*  BILIDIR --  IBILI --    PT/INR  Basename 09/20/11 1230  LABPROT 13.7  INR 1.03   Hepatitis Panel No results found for this basename: HEPBSAG,HCVAB,HEPAIGM,HEPBIGM in the last 72 hours        Assessment / Plan: #1 Resolved  recurrent diverticular bleed  No active bleeding  Past 2 days. She is satble from Gi standpoint to be discharged home, to rest at home. No asa or nsaids. Pt advised to come back to er in event of any recurrent bleeding. Will have her come to office lab next week for CBC, And will make her a f/u appt with Dr. Leone Payor -need to discuss possible elective resection further.  # 2  Anemia -stable , secondary to acute blood loss.    LOS: 3 days   [name]  09/22/2011, 9:18 AM

## 2011-09-22 NOTE — Plan of Care (Signed)
Problem: Discharge Progression Outcomes Goal: Discharge plan in place and appropriate Outcome: Completed/Met Date Met:  09/22/11 Pt discharge home with daughters. No medical equipment needed and pt stable upon discharged. Taken to car in wheelchair with staff member

## 2011-09-22 NOTE — Progress Notes (Signed)
Patient seen and I agree with the above documentation, including the assessment and plan.  

## 2011-09-22 NOTE — Discharge Summary (Signed)
DISCHARGE SUMMARY  [name]  MR#: [id number]  DOB:[birth year]  Date of Admission: 09/19/2011 Date of Discharge: 09/22/2011  Attending Physician:Davis MD, Ladell Pier  Patient's WUJ:WJXBJ,YNWGNFA Venetia Constable, MD  Consults:Treatment Team:  Iva Boop, MD  Discharge Diagnoses: Present on Admission:  .Hematochezia    Current Discharge Medication List    START taking these medications   Details  pantoprazole (PROTONIX) 40 MG tablet Take 1 tablet (40 mg total) by mouth daily. Qty: 30 tablet, Refills: 0      CONTINUE these medications which have CHANGED   Details  glimepiride (AMARYL) 4 MG tablet Take 1 tablet (4 mg total) by mouth daily before breakfast. Qty: 30 tablet, Refills: 0    hyoscyamine (LEVBID) 0.375 MG 12 hr tablet Take 1 tablet (0.375 mg total) by mouth every 12 (twelve) hours as needed for cramping. Qty: 180 tablet, Refills: 3    sitaGLIPtan-metformin (JANUMET) 50-500 MG per tablet Take 1 tablet by mouth 2 (two) times daily with a meal. Qty: 60 tablet, Refills: 0      CONTINUE these medications which have NOT CHANGED   Details  amLODipine (NORVASC) 5 MG tablet Take 5 mg by mouth daily.      ramipril (ALTACE) 10 MG tablet Take 10 mg by mouth daily.           Hospital Course: Present on Admission:  .Hematochezia Patient was admitted to the hospital for rectal bleeding secondary to diverticula. This is her third episode of rectal bleeding. She had rectal bleeding back in 2010 requiring colonoscopy and  Endo Clip followed by angiogram with embolization.  She also had recurring bleeding in February 2011 requiring supportive management. GI discuss with patient that she may in the future need  elective colectomy. Patient bleeding has now resolved and she will followup outpatient with GI.  Diabetes She will resume her home medications at the time of discharge. Blood sugars are stable.  Hypertension: Her blood pressure medications were initially  on hold secondary to hyportension. Her blood pressure has since normalized and she will resume her home blood pressure medications.  Day of Discharge BP 147/75  Pulse 79  Temp(Src) 98.3 F (36.8 C) (Oral)  Resp 16  Ht 5\' 1"  (1.549 m)  Wt 111.7 kg (246 lb 4.1 oz)  BMI 46.53 kg/m2  SpO2 100%  Physical Exam: General: Patient appears her stated age and does not seem to be in any acute distress. Cardiovascular: Regular rate rhythm no murmurs rubs or gallops. Lungs: Clear to auscultation bilaterally no wheezes rhonchi or rales. Abdomen: Soft nontender nondistended positive bowel sounds. Extremities: without edema.  Results for orders placed during the hospital encounter of 09/19/11 (from the past 24 hour(s))  GLUCOSE, CAPILLARY     Status: Abnormal   Collection Time   09/21/11  3:44 PM      Component Value Range   Glucose-Capillary 145 (*) 70 - 99 (mg/dL)   Comment 1 Documented in Chart     Comment 2 Notify RN    GLUCOSE, CAPILLARY     Status: Abnormal   Collection Time   09/21/11  7:19 PM      Component Value Range   Glucose-Capillary 117 (*) 70 - 99 (mg/dL)  HEMOGLOBIN AND HEMATOCRIT, BLOOD     Status: Abnormal   Collection Time   09/21/11  8:30 PM      Component Value Range   Hemoglobin 8.9 (*) 12.0 - 15.0 (g/dL)   HCT 21.3 (*) 08.6 - 46.0 (%)  GLUCOSE, CAPILLARY     Status: Abnormal   Collection Time   09/22/11 12:57 AM      Component Value Range   Glucose-Capillary 141 (*) 70 - 99 (mg/dL)  GLUCOSE, CAPILLARY     Status: Abnormal   Collection Time   09/22/11  4:16 AM      Component Value Range   Glucose-Capillary 139 (*) 70 - 99 (mg/dL)  CBC     Status: Abnormal   Collection Time   09/22/11  5:00 AM      Component Value Range   WBC 7.6  4.0 - 10.5 (K/uL)   RBC 3.41 (*) 3.87 - 5.11 (MIL/uL)   Hemoglobin 9.4 (*) 12.0 - 15.0 (g/dL)   HCT 78.2 (*) 95.6 - 46.0 (%)   MCV 86.8  78.0 - 100.0 (fL)   MCH 27.6  26.0 - 34.0 (pg)   MCHC 31.8  30.0 - 36.0 (g/dL)   RDW 21.3  08.6  - 57.8 (%)   Platelets 188  150 - 400 (K/uL)  BASIC METABOLIC PANEL     Status: Abnormal   Collection Time   09/22/11  5:00 AM      Component Value Range   Sodium 138  135 - 145 (mEq/L)   Potassium 3.5  3.5 - 5.1 (mEq/L)   Chloride 104  96 - 112 (mEq/L)   CO2 26  19 - 32 (mEq/L)   Glucose, Bld 145 (*) 70 - 99 (mg/dL)   BUN 6  6 - 23 (mg/dL)   Creatinine, Ser 4.69  0.50 - 1.10 (mg/dL)   Calcium 9.3  8.4 - 62.9 (mg/dL)   GFR calc non Af Amer 76 (*) >90 (mL/min)   GFR calc Af Amer 88 (*) >90 (mL/min)  GLUCOSE, CAPILLARY     Status: Abnormal   Collection Time   09/22/11  7:50 AM      Component Value Range   Glucose-Capillary 177 (*) 70 - 99 (mg/dL)  HEMOGLOBIN AND HEMATOCRIT, BLOOD     Status: Abnormal   Collection Time   09/22/11  8:05 AM      Component Value Range   Hemoglobin 9.4 (*) 12.0 - 15.0 (g/dL)   HCT 52.8 (*) 41.3 - 46.0 (%)  GLUCOSE, CAPILLARY     Status: Abnormal   Collection Time   09/22/11 12:14 PM      Component Value Range   Glucose-Capillary 102 (*) 70 - 99 (mg/dL)    Disposition: Home  Follow-up Appts: Discharge Orders    Future Appointments: Provider: Department: Dept Phone: Center:   10/18/2011 1:30 PM Iva Boop, MD [facility] [phone] [facility]     Future Orders Please Complete By Expires   Diet - low sodium heart healthy      Diet Carb Modified      Increase activity slowly      Discharge instructions      Comments:   If rectal bleedind reoccur then return to the emergency room.         Tests Needing Follow-up: CBC  Signed: Earlene Plater MD, Ladell Pier 09/22/2011, 12:34 PM

## 2011-10-10 ENCOUNTER — Telehealth: Payer: Self-pay | Admitting: Internal Medicine

## 2011-10-10 DIAGNOSIS — Z8719 Personal history of other diseases of the digestive system: Secondary | ICD-10-CM

## 2011-10-10 NOTE — Telephone Encounter (Signed)
Pt called to report she has an appt with Dr Leone Payor on 10/18/11; she needs a lab prior to appt. Progress notes stated pt needs a f/u prior to visit; pt will come in early on 10/18/11.

## 2011-10-18 ENCOUNTER — Encounter: Payer: Self-pay | Admitting: Internal Medicine

## 2011-10-18 ENCOUNTER — Ambulatory Visit (INDEPENDENT_AMBULATORY_CARE_PROVIDER_SITE_OTHER): Payer: Self-pay | Admitting: Internal Medicine

## 2011-10-18 ENCOUNTER — Other Ambulatory Visit (INDEPENDENT_AMBULATORY_CARE_PROVIDER_SITE_OTHER): Payer: Self-pay

## 2011-10-18 VITALS — BP 136/74 | HR 88 | Ht 61.0 in | Wt 250.0 lb

## 2011-10-18 DIAGNOSIS — D62 Acute posthemorrhagic anemia: Secondary | ICD-10-CM

## 2011-10-18 DIAGNOSIS — Z8719 Personal history of other diseases of the digestive system: Secondary | ICD-10-CM

## 2011-10-18 DIAGNOSIS — K5731 Diverticulosis of large intestine without perforation or abscess with bleeding: Secondary | ICD-10-CM

## 2011-10-18 LAB — CBC WITH DIFFERENTIAL/PLATELET
Basophils Absolute: 0 10*3/uL (ref 0.0–0.1)
Eosinophils Absolute: 0.2 10*3/uL (ref 0.0–0.7)
Hemoglobin: 11.4 g/dL — ABNORMAL LOW (ref 12.0–15.0)
Lymphocytes Relative: 35.2 % (ref 12.0–46.0)
Lymphs Abs: 3.4 10*3/uL (ref 0.7–4.0)
MCHC: 32.3 g/dL (ref 30.0–36.0)
Monocytes Absolute: 0.7 10*3/uL (ref 0.1–1.0)
Neutro Abs: 5.3 10*3/uL (ref 1.4–7.7)
RDW: 15.7 % — ABNORMAL HIGH (ref 11.5–14.6)

## 2011-10-18 NOTE — Progress Notes (Signed)
  Subjective:    Patient ID: [name], female    DOB: 10-07-1956, 55 y.o.   MRN: [id number]  HPI Colbi was in the hospital in November with a suspected recurrent diverticular hemorrhage based upon clinical course and findings. She has done well since with no recurrent bleeding. Hemoglobin was 9.4 at discharge, and rose to 11.4 today. It was mentioned to her that since she has had 3 total bleeding episodes she could consider elective surgical resection. She is not inclined to do so at this time. No Known Allergies Outpatient Prescriptions Prior to Visit  Medication Sig Dispense Refill  . amLODipine (NORVASC) 5 MG tablet Take 5 mg by mouth daily.        Marland Kitchen glimepiride (AMARYL) 4 MG tablet Take 1 tablet (4 mg total) by mouth daily before breakfast.  30 tablet  0  . ramipril (ALTACE) 10 MG tablet Take 10 mg by mouth daily.       . sitaGLIPtan-metformin (JANUMET) 50-500 MG per tablet Take 1 tablet by mouth 2 (two) times daily with a meal.  60 tablet  0  . hyoscyamine (LEVBID) 0.375 MG 12 hr tablet Take 1 tablet (0.375 mg total) by mouth every 12 (twelve) hours as needed for cramping.  180 tablet  3  . pantoprazole (PROTONIX) 40 MG tablet Take 1 tablet (40 mg total) by mouth daily.  30 tablet  0   Past Medical History  Diagnosis Date  . Diverticulosis of colon with hemorrhage 2010, 09/2011  . Hypertension   . Adenomatous colon polyp   . Diabetes mellitus   . Renal cell carcinoma 2004  . Blood transfusion   . IBS (irritable bowel syndrome)    Past Surgical History  Procedure Date  . Nephrectomy     left  . Tubal ligation   . Knee arthroscopy     x2 left  . Hand surgery     x5 right  . Abdominal hysterectomy   . Cholecystectomy   . Upper gastrointestinal endoscopy 12/12/2008    gastroporesis  . Flexible sigmoidoscopy 12/12/2008    diverticulosis  . Colonoscopy 12/19/2008    diverticulosis   History   Social History  . Marital Status: Divorced    Spouse Name: N/A   Number of Children: 3  . Years of Education: N/A   Occupational History  . Training and development officer    Social History Main Topics  . Smoking status: Never Smoker   . Smokeless tobacco: Never Used  . Alcohol Use: No  . Drug Use: No  . Sexually Active: None   Other Topics Concern  . None   Social History Narrative  . None   Family History  Problem Relation Age of Onset  . Colon polyps Mother   . Diabetes Maternal Grandmother   . Diabetes Father   . Colon cancer Neg Hx          Review of Systems As above.    Objective:   Physical Exam Obese no acute distress       Assessment & Plan:  She has had recurrent diverticular bleeding. However given her obesity, and her unwillingness to consider surgery at this time and the fact that she would need a subtotal colectomy, we'll hold off on that. I will recheck a hemoglobin and her ferritin level a month to make sure she does need the iron. She will see me as needed otherwise.

## 2011-10-18 NOTE — Assessment & Plan Note (Addendum)
She is not inclined to pursue a more aggressive intervention. She know she's had multiple bleeding episodes but she has recovered from all those. In 2010 she did require radiology intervention for bleeding site in the hepatic flexure. We have plan to observe and see what happens. She will followup labs a month to recheck her CBC and check a ferritin level nature iron levels okay at that time. She will follow up with primary care pertaining lesion in the future as well. If she needs 81 mg aspirin regarding her diabetes, that is not inappropriate though it could potentially increase bleeding episodes.

## 2011-10-18 NOTE — Patient Instructions (Signed)
We have put in for you to have some labs done,please return in 1 month to have you labs drawn. Return to see Dr. Leone Payor as needed

## 2011-11-07 ENCOUNTER — Ambulatory Visit: Payer: Self-pay

## 2011-11-07 DIAGNOSIS — B9789 Other viral agents as the cause of diseases classified elsewhere: Secondary | ICD-10-CM

## 2011-11-14 ENCOUNTER — Encounter (HOSPITAL_COMMUNITY): Payer: Self-pay | Admitting: Family Medicine

## 2011-11-14 ENCOUNTER — Other Ambulatory Visit: Payer: Self-pay

## 2011-11-14 ENCOUNTER — Inpatient Hospital Stay (HOSPITAL_COMMUNITY)
Admission: EM | Admit: 2011-11-14 | Discharge: 2011-11-20 | DRG: 378 | Disposition: A | Discharge status: Home / Self Care | Payer: Self-pay | Attending: Internal Medicine | Admitting: Internal Medicine

## 2011-11-14 ENCOUNTER — Emergency Department (HOSPITAL_COMMUNITY): Payer: Self-pay

## 2011-11-14 DIAGNOSIS — K589 Irritable bowel syndrome without diarrhea: Secondary | ICD-10-CM | POA: Diagnosis present

## 2011-11-14 DIAGNOSIS — D375 Neoplasm of uncertain behavior of rectum: Secondary | ICD-10-CM | POA: Diagnosis present

## 2011-11-14 DIAGNOSIS — K5731 Diverticulosis of large intestine without perforation or abscess with bleeding: Principal | ICD-10-CM | POA: Diagnosis present

## 2011-11-14 DIAGNOSIS — I10 Essential (primary) hypertension: Secondary | ICD-10-CM | POA: Diagnosis present

## 2011-11-14 DIAGNOSIS — Z86010 Personal history of colonic polyps: Secondary | ICD-10-CM

## 2011-11-14 DIAGNOSIS — IMO0002 Reserved for concepts with insufficient information to code with codable children: Secondary | ICD-10-CM | POA: Diagnosis present

## 2011-11-14 DIAGNOSIS — E669 Obesity, unspecified: Secondary | ICD-10-CM | POA: Diagnosis present

## 2011-11-14 DIAGNOSIS — K625 Hemorrhage of anus and rectum: Secondary | ICD-10-CM | POA: Diagnosis present

## 2011-11-14 DIAGNOSIS — Z905 Acquired absence of kidney: Secondary | ICD-10-CM

## 2011-11-14 DIAGNOSIS — E871 Hypo-osmolality and hyponatremia: Secondary | ICD-10-CM | POA: Diagnosis present

## 2011-11-14 DIAGNOSIS — D371 Neoplasm of uncertain behavior of stomach: Secondary | ICD-10-CM | POA: Diagnosis present

## 2011-11-14 DIAGNOSIS — E118 Type 2 diabetes mellitus with unspecified complications: Secondary | ICD-10-CM

## 2011-11-14 DIAGNOSIS — C649 Malignant neoplasm of unspecified kidney, except renal pelvis: Secondary | ICD-10-CM | POA: Insufficient documentation

## 2011-11-14 DIAGNOSIS — R000 Tachycardia, unspecified: Secondary | ICD-10-CM | POA: Diagnosis present

## 2011-11-14 DIAGNOSIS — E875 Hyperkalemia: Secondary | ICD-10-CM | POA: Diagnosis present

## 2011-11-14 DIAGNOSIS — E1165 Type 2 diabetes mellitus with hyperglycemia: Secondary | ICD-10-CM | POA: Diagnosis present

## 2011-11-14 DIAGNOSIS — D62 Acute posthemorrhagic anemia: Secondary | ICD-10-CM | POA: Clinically undetermined

## 2011-11-14 DIAGNOSIS — K922 Gastrointestinal hemorrhage, unspecified: Secondary | ICD-10-CM

## 2011-11-14 DIAGNOSIS — Z794 Long term (current) use of insulin: Secondary | ICD-10-CM

## 2011-11-14 DIAGNOSIS — Z85528 Personal history of other malignant neoplasm of kidney: Secondary | ICD-10-CM

## 2011-11-14 DIAGNOSIS — I152 Hypertension secondary to endocrine disorders: Secondary | ICD-10-CM | POA: Diagnosis present

## 2011-11-14 DIAGNOSIS — R0602 Shortness of breath: Secondary | ICD-10-CM | POA: Diagnosis present

## 2011-11-14 HISTORY — DX: Personal history of urinary (tract) infections: Z87.440

## 2011-11-14 LAB — URINALYSIS, ROUTINE W REFLEX MICROSCOPIC
Bilirubin Urine: NEGATIVE
Hgb urine dipstick: NEGATIVE
Ketones, ur: NEGATIVE mg/dL
Nitrite: NEGATIVE
Protein, ur: 100 mg/dL — AB
Specific Gravity, Urine: 1.03 (ref 1.005–1.030)
Urobilinogen, UA: 0.2 mg/dL (ref 0.0–1.0)

## 2011-11-14 LAB — OCCULT BLOOD, POC DEVICE: Fecal Occult Bld: POSITIVE

## 2011-11-14 LAB — CBC
HCT: 28.9 % — ABNORMAL LOW (ref 36.0–46.0)
Hemoglobin: 12.5 g/dL (ref 12.0–15.0)
MCHC: 32.3 g/dL (ref 30.0–36.0)
Platelets: 229 10*3/uL (ref 150–400)
Platelets: 192 10*3/uL (ref 150–400)
RDW: 14.1 % (ref 11.5–15.5)
RDW: 14.4 % (ref 11.5–15.5)
WBC: 11.2 10*3/uL — ABNORMAL HIGH (ref 4.0–10.5)

## 2011-11-14 LAB — COMPREHENSIVE METABOLIC PANEL
AST: 45 U/L — ABNORMAL HIGH (ref 0–37)
Albumin: 2.8 g/dL — ABNORMAL LOW (ref 3.5–5.2)
Alkaline Phosphatase: 109 U/L (ref 39–117)
Chloride: 98 mEq/L (ref 96–112)
Potassium: 6.1 mEq/L — ABNORMAL HIGH (ref 3.5–5.1)
Sodium: 130 mEq/L — ABNORMAL LOW (ref 135–145)
Total Bilirubin: 0.2 mg/dL — ABNORMAL LOW (ref 0.3–1.2)

## 2011-11-14 LAB — BASIC METABOLIC PANEL
BUN: 20 mg/dL (ref 6–23)
Calcium: 8.1 mg/dL — ABNORMAL LOW (ref 8.4–10.5)
Creatinine, Ser: 0.95 mg/dL (ref 0.50–1.10)
GFR calc Af Amer: 77 mL/min — ABNORMAL LOW (ref >90)
GFR calc non Af Amer: 67 mL/min — ABNORMAL LOW (ref >90)
Glucose, Bld: 385 mg/dL — ABNORMAL HIGH (ref 70–99)
Potassium: 3.9 mEq/L (ref 3.5–5.1)

## 2011-11-14 LAB — DIFFERENTIAL
Basophils Absolute: 0 10*3/uL (ref 0.0–0.1)
Basophils Relative: 0 % (ref 0–1)
Neutro Abs: 6.6 10*3/uL (ref 1.7–7.7)
Neutrophils Relative %: 56 % (ref 43–77)

## 2011-11-14 LAB — GLUCOSE, CAPILLARY: Glucose-Capillary: 388 mg/dL — ABNORMAL HIGH (ref 70–99)

## 2011-11-14 LAB — PREPARE RBC (CROSSMATCH)

## 2011-11-14 LAB — URINE MICROSCOPIC-ADD ON

## 2011-11-14 MED ORDER — SODIUM CHLORIDE 0.9 % IV BOLUS (SEPSIS)
1000.0000 mL | Freq: Once | INTRAVENOUS | Status: AC
  Administered 2011-11-14: 1000 mL via INTRAVENOUS

## 2011-11-14 MED ORDER — TECHNETIUM TC 99M-LABELED RED BLOOD CELLS IV KIT
23.5000 | PACK | Freq: Once | INTRAVENOUS | Status: AC | PRN
  Administered 2011-11-14: 24 via INTRAVENOUS

## 2011-11-14 MED ORDER — SODIUM CHLORIDE 0.9 % IV SOLN
Freq: Once | INTRAVENOUS | Status: AC
  Administered 2011-11-16: 18:00:00 via INTRAVENOUS

## 2011-11-14 MED ORDER — INSULIN ASPART 100 UNIT/ML ~~LOC~~ SOLN
0.0000 [IU] | Freq: Three times a day (TID) | SUBCUTANEOUS | Status: DC
  Filled 2011-11-14: qty 3

## 2011-11-14 MED ORDER — ALBUTEROL SULFATE (5 MG/ML) 0.5% IN NEBU: 2.5000 mg | INHALATION_SOLUTION | RESPIRATORY_TRACT | Status: DC | PRN

## 2011-11-14 MED ORDER — ZOLPIDEM TARTRATE 5 MG PO TABS: 5.0000 mg | ORAL_TABLET | Freq: Every evening | ORAL | Status: DC | PRN

## 2011-11-14 MED ORDER — SODIUM CHLORIDE 0.9 % IV SOLN
INTRAVENOUS | Status: AC
  Administered 2011-11-14: 20:00:00 via INTRAVENOUS

## 2011-11-14 MED ORDER — IPRATROPIUM BROMIDE 0.02 % IN SOLN: 0.5000 mg | Freq: Four times a day (QID) | RESPIRATORY_TRACT | Status: DC | PRN

## 2011-11-14 MED ORDER — ACETAMINOPHEN 325 MG PO TABS: 650.0000 mg | ORAL_TABLET | Freq: Four times a day (QID) | ORAL | Status: DC | PRN

## 2011-11-14 MED ORDER — ACETAMINOPHEN 650 MG RE SUPP: 650.0000 mg | Freq: Four times a day (QID) | RECTAL | Status: DC | PRN

## 2011-11-14 MED ORDER — INSULIN ASPART 100 UNIT/ML ~~LOC~~ SOLN
0.0000 [IU] | Freq: Every day | SUBCUTANEOUS | Status: DC
  Administered 2011-11-14: 15 [IU] via SUBCUTANEOUS
  Administered 2011-11-15 (×4): 5 [IU] via SUBCUTANEOUS
  Administered 2011-11-15: 8 [IU] via SUBCUTANEOUS
  Administered 2011-11-16: 5 [IU] via SUBCUTANEOUS
  Administered 2011-11-16 (×2): 3 [IU] via SUBCUTANEOUS
  Administered 2011-11-16: 5 [IU] via SUBCUTANEOUS
  Administered 2011-11-16 (×2): 3 [IU] via SUBCUTANEOUS
  Administered 2011-11-17 (×2): 2 [IU] via SUBCUTANEOUS
  Administered 2011-11-17 – 2011-11-18 (×3): 3 [IU] via SUBCUTANEOUS
  Administered 2011-11-18 (×2): 2 [IU] via SUBCUTANEOUS
  Administered 2011-11-18 (×2): 5 [IU] via SUBCUTANEOUS
  Administered 2011-11-18: 3 [IU] via SUBCUTANEOUS
  Filled 2011-11-14: qty 3

## 2011-11-14 MED ORDER — OXYCODONE HCL 5 MG PO TABS: 5.0000 mg | ORAL_TABLET | ORAL | Status: DC | PRN

## 2011-11-14 MED ORDER — ONDANSETRON HCL 4 MG/2ML IJ SOLN: 4.0000 mg | Freq: Three times a day (TID) | INTRAMUSCULAR | Status: AC | PRN

## 2011-11-14 MED ORDER — SODIUM CHLORIDE 0.9 % IV BOLUS (SEPSIS): 1000.0000 mL | Freq: Once | INTRAVENOUS | Status: DC

## 2011-11-14 MED ORDER — HYDROMORPHONE HCL PF 1 MG/ML IJ SOLN: 1.0000 mg | INTRAMUSCULAR | Status: DC | PRN

## 2011-11-14 MED ORDER — INSULIN ASPART 100 UNIT/ML ~~LOC~~ SOLN: 0.0000 [IU] | Freq: Every day | SUBCUTANEOUS | Status: DC

## 2011-11-14 NOTE — ED Provider Notes (Signed)
History     CSN: [id number]  Arrival date & time 11/14/11  0819   First MD Initiated Contact with Patient 11/14/11 [phone]      Chief Complaint  Patient presents with  . Rectal Bleeding    (Consider location/radiation/quality/duration/timing/severity/associated sxs/prior treatment) HPI History is obtained from the patient. She presents today as she had a large amount of bright red blood per rectum this morning. It was associated with a bowel movement, and was significant enough to turn the water in the toilet bowl red. She states that she has been hospitalized for the same in the past, in 2010 and 09/2011; she was found to have diverticulosis of the colon with hemorrhage. She apparently lost enough blood on her 2010 hospitalization to require transfusion, but did not on her most recent visit. She believes that her Hgb was 11.8 on discharge in November. During the 2010 hospitalization, a sigmoidoscopy was performed and the area of bleeding was cauterized. A procedure was not performed during her recent hospitalization, and she did not have any imaging done at that time. She is followed by [facility], and does not have a surgeon that regularly follows her. She has not had any associated pain with the passage of blood today, either abdominal or rectal. She has not had any nausea or vomiting. She has not had any change in appetite, and ate normally yesterday. Denies recent fever or weight loss.  PMH also significant for IBS and adenomatous colon polyp. Last colonoscopy 12/2008.  Past Medical History  Diagnosis Date  . Diverticulosis of colon with hemorrhage 2010, 09/2011  . Hypertension   . Adenomatous colon polyp   . Diabetes mellitus   . Renal cell carcinoma 2004  . Blood transfusion   . IBS (irritable bowel syndrome)     Past Surgical History  Procedure Date  . Nephrectomy     left  . Tubal ligation   . Knee arthroscopy     x2 left  . Hand surgery     x5 right  . Abdominal  hysterectomy   . Cholecystectomy   . Upper gastrointestinal endoscopy 12/12/2008    gastroporesis  . Flexible sigmoidoscopy 12/12/2008    diverticulosis  . Colonoscopy 12/19/2008    diverticulosis    Family History  Problem Relation Age of Onset  . Colon polyps Mother   . Diabetes Maternal Grandmother   . Diabetes Father   . Colon cancer Neg Hx     History  Substance Use Topics  . Smoking status: Never Smoker   . Smokeless tobacco: Never Used  . Alcohol Use: No    OB History    Grav Para Term Preterm Abortions TAB SAB Ect Mult Living                  Review of Systems  Constitutional: Negative for fever, appetite change, fatigue and unexpected weight change.  HENT: Positive for congestion and rhinorrhea. Negative for sore throat.        States she is getting over flulike sx  Respiratory: Negative for cough and shortness of breath.   Cardiovascular: Negative for chest pain and palpitations.  Gastrointestinal: Positive for anal bleeding. Negative for nausea, vomiting, abdominal pain, diarrhea, constipation and rectal pain.  Genitourinary: Negative for dysuria and vaginal discharge.  Musculoskeletal: Negative for myalgias.  Skin: Negative for rash.  Neurological: Negative for dizziness, weakness and light-headedness.  Hematological: Does not bruise/bleed easily.    Allergies  Review of patient's allergies indicates no known  allergies.  Home Medications   Current Outpatient Rx  Name Route Sig Dispense Refill  . AMLODIPINE BESYLATE 5 MG PO TABS Oral Take 5 mg by mouth daily.      Marland Kitchen GLIMEPIRIDE 4 MG PO TABS Oral Take 1 tablet (4 mg total) by mouth daily before breakfast. 30 tablet 0  . HYOSCYAMINE SULFATE ER 0.375 MG PO TB12 Oral Take 0.375 mg by mouth as needed.      Marland Kitchen RAMIPRIL 10 MG PO TABS Oral Take 10 mg by mouth daily.     Marland Kitchen SITAGLIPTIN-METFORMIN HCL 50-500 MG PO TABS Oral Take 1 tablet by mouth 2 (two) times daily with a meal. 60 tablet 0    BP 191/109  Pulse  114  Temp(Src) 99.1 F (37.3 C) (Oral)  Resp 18  SpO2 99%  Physical Exam  Nursing note and vitals reviewed. Constitutional: She appears well-developed and well-nourished. No distress.  HENT:  Head: Normocephalic and atraumatic.  Eyes: Conjunctivae are normal. Right eye exhibits no discharge. Left eye exhibits no discharge.  Neck: Normal range of motion. Neck supple.  Cardiovascular: Normal rate, regular rhythm and normal heart sounds.   Pulmonary/Chest: Effort normal. No respiratory distress. She has wheezes.  Abdominal: Soft. Bowel sounds are normal. She exhibits no distension and no mass. There is no tenderness. There is no rebound and no guarding.       Abdomen obese  Genitourinary: Rectal exam shows no external hemorrhoid, no internal hemorrhoid, no fissure, no mass, no tenderness and anal tone normal. Guaiac positive stool.  Musculoskeletal: Normal range of motion.  Neurological: She is alert.  Skin: Skin is warm and dry. No rash noted. She is not diaphoretic.  Psychiatric: She has a normal mood and affect.    ED Course  Procedures (including critical care time)   Date: 11/14/2011  Rate: 101  Rhythm: sinus tachycardia  QRS Axis: right  Intervals: normal  ST/T Wave abnormalities: normal  Conduction Disutrbances:none  Narrative Interpretation:   Old EKG Reviewed: no significant changes as compared with Sep 25 2011    Labs Reviewed  CBC - Abnormal; Notable for the following:    WBC 11.8 (*)    All other components within normal limits  DIFFERENTIAL - Abnormal; Notable for the following:    Lymphs Abs 4.2 (*)    All other components within normal limits  COMPREHENSIVE METABOLIC PANEL - Abnormal; Notable for the following:    Sodium 130 (*)    Potassium 6.1 (*) MODERATE HEMOLYSIS   Glucose, Bld 395 (*)    Albumin 2.8 (*)    AST 45 (*) MODERATE HEMOLYSIS   Total Bilirubin 0.2 (*)    GFR calc non Af Amer 71 (*)    GFR calc Af Amer 83 (*)    All other components  within normal limits  URINALYSIS, ROUTINE W REFLEX MICROSCOPIC - Abnormal; Notable for the following:    APPearance CLOUDY (*)    Glucose, UA >1000 (*)    Protein, ur 100 (*)    All other components within normal limits  BASIC METABOLIC PANEL - Abnormal; Notable for the following:    Glucose, Bld 385 (*)    Calcium 8.1 (*)    GFR calc non Af Amer 67 (*)    GFR calc Af Amer 77 (*)    All other components within normal limits  URINE MICROSCOPIC-ADD ON - Abnormal; Notable for the following:    Bacteria, UA MANY (*)    Casts HYALINE CASTS (*)  GRANULAR CAST   All other components within normal limits  TYPE AND SCREEN  OCCULT BLOOD, POC DEVICE  PREPARE RBC (CROSSMATCH)  POCT OCCULT BLOOD STOOL, DEVICE  CBC  CBC  CBC   No results found.   1. Diverticulosis of colon with hemorrhage - recurrent       MDM  10:06 AM Pt assessed. RN at bedside states that pt has had BM while in the dept and there was a moderate amount of blood in the bowl. Rectal exam significant for gross blood. Plan to obtain H/H, basic labs.   H/H stable. Pt has been admitted to [facility] for monitoring and further evaluation. It is possible that she may require colectomy or other procedure to alleviate bleeding. NP with [facility] has consulted as well and will follow.      Grant Fontana, [place] 11/14/11 1529

## 2011-11-14 NOTE — ED Notes (Signed)
Taken to radiology for scan.

## 2011-11-14 NOTE — ED Notes (Signed)
Report given to Marsh Dolly

## 2011-11-14 NOTE — ED Notes (Signed)
Family at bedside. 

## 2011-11-14 NOTE — ED Notes (Signed)
Remains in nuclear med

## 2011-11-14 NOTE — ED Notes (Signed)
Pt up to bathroom. Had moderate amount of bloody stool. Admits to dizziness.

## 2011-11-14 NOTE — H&P (Signed)
Patient's PCP: Allean Found, MD, MD Primary gastroenterologist: Stan Head, MD  Chief Complaint: Bright red blood per rectum  History of Present Illness: [name] is a 55 y.o. African American female with history of left nephrectomy for renal cell carcinoma, history of diverticulosis with recurrent GI bleed, diabetes, history of irritable bowel syndrome, hypertension who presents with the above complaints.  Patient reports that her symptoms started this morning where she had 3 episodes of bright red blood per rectum.  As a result presented to the emergency department and while in the emergency department had 3 episodes of bright red blood per rectum.  She otherwise reports that her health has been stable.  Reported that she had a low-grade fever this morning.  She has been taking prednisone for some shortness of breath and wheezing.  Denies any chest pain.  Denies any abdominal pain.  Denies any constipation or diarrhea.  Denies any headaches or vision changes.  Patient does report that because of the steroids her blood sugars have been elevated.  Meds: Scheduled Meds:   . sodium chloride   Intravenous Once  . sodium chloride   Intravenous STAT  . sodium chloride  1,000 mL Intravenous Once  . sodium chloride  1,000 mL Intravenous Once   Continuous Infusions:  PRN Meds:.HYDROmorphone (DILAUDID) injection, ondansetron (ZOFRAN) IV, technetium labeled red blood cells Allergies: Review of patient's allergies indicates no known allergies. Past Medical History  Diagnosis Date  . Diverticulosis of colon with hemorrhage 2010, 09/2011  . Hypertension   . Adenomatous colon polyp   . Diabetes mellitus   . Renal cell carcinoma 2004  . Blood transfusion   . IBS (irritable bowel syndrome)   . Hx: UTI (urinary tract infection)    Past Surgical History  Procedure Date  . Nephrectomy     left  . Tubal ligation   . Knee arthroscopy     x2 left  . Hand surgery     x5 right    . Abdominal hysterectomy   . Cholecystectomy   . Upper gastrointestinal endoscopy 12/12/2008    gastroporesis  . Flexible sigmoidoscopy 12/12/2008    diverticulosis  . Colonoscopy 12/19/2008    diverticulosis   Family History  Problem Relation Age of Onset  . Colon polyps Mother   . Diabetes Maternal Grandmother   . Diabetes Father   . Colon cancer Neg Hx    History   Social History  . Marital Status: Divorced    Spouse Name: N/A    Number of Children: 3  . Years of Education: N/A   Occupational History  . Training and development officer    Social History Main Topics  . Smoking status: Never Smoker   . Smokeless tobacco: Never Used  . Alcohol Use: No  . Drug Use: No  . Sexually Active: No   Other Topics Concern  . Not on file   Social History Narrative  . No narrative on file   Review of Systems: All systems reviewed with the patient and positive as per history of present illness, otherwise all other systems are negative.  Physical Exam: Blood pressure 142/84, pulse 109, temperature 99.1 F (37.3 C), temperature source Oral, resp. rate 18, SpO2 100.00%. General: Awake, Oriented x3, No acute distress. HEENT: EOMI, Moist mucous membranes Neck: Supple CV: S1 and S2 Lungs: Scattered expiratory wheezing good air movement bilaterally. Abdomen: Soft, Nontender, Nondistended, +bowel sounds. Ext: Good pulses. Trace edema. No clubbing or cyanosis noted. Neuro: Cranial Nerves II-XII grossly  intact. Has 5/5 motor strength in upper and lower extremities.   Lab results:  [facility] 11/14/11 1225 11/14/11 0915  NA 135 130*  K 3.9 6.1*  CL 105 98  CO2 21 22  GLUCOSE 385* 395*  BUN 20 20  CREATININE 0.95 0.90  CALCIUM 8.1* 9.0  MG -- --  PHOS -- --    Basename 11/14/11 0915  AST 45*  ALT 32  ALKPHOS 109  BILITOT 0.2*  PROT 7.5  ALBUMIN 2.8*   No results found for this basename: LIPASE:2,AMYLASE:2 in the last 72 hours  Basename 11/14/11 0915  WBC 11.8*  NEUTROABS 6.6  HGB  12.5  HCT 38.7  MCV 83.8  PLT 229   No results found for this basename: CKTOTAL:3,CKMB:3,CKMBINDEX:3,TROPONINI:3 in the last 72 hours No components found with this basename: POCBNP:3 No results found for this basename: DDIMER in the last 72 hours No results found for this basename: HGBA1C:2 in the last 72 hours No results found for this basename: CHOL:2,HDL:2,LDLCALC:2,TRIG:2,CHOLHDL:2,LDLDIRECT:2 in the last 72 hours No results found for this basename: TSH,T4TOTAL,FREET3,T3FREE,THYROIDAB in the last 72 hours No results found for this basename: VITAMINB12:2,FOLATE:2,FERRITIN:2,TIBC:2,IRON:2,RETICCTPCT:2 in the last 72 hours Imaging results:  [facility] Gi Blood Loss  11/14/2011  *RADIOLOGY REPORT*  Clinical Data: Recurrent diverticular GI bleed.  NUCLEAR MEDICINE GASTROINTESTINAL BLEEDING STUDY  Technique:  Sequential abdominal images were obtained following intravenous administration of Tc-96m labeled red blood cells.  Radiopharmaceutical: 23.55 mCi technetium 53m labeled red blood cells  Comparison: 12/16/2008  Findings: Imaging shows equivocal activity over the first 60 minutes near the liver.  This activity does not show significant transit and is not as active as blood pool.  This activity is also not nearly as strong as activity noted during a positive bleeding scan 2 years ago.  IMPRESSION: No grossly positive active GI bleed is identified. Equivocal activity is present near the liver.  Original Report Authenticated By: Reola Calkins, M.D.   Other results: EKG: unchanged from previous tracings, sinus tachycardia.  Assessment & Plan by Problem: 1. BRBPR (bright red blood per rectum)/painless hematochezia, is likely due to recurrent diverticular bleed.  Appreciate GI input.  Patient is currently getting tired RBC and interventional radiology is on board to consider arteriogram/embolization if positive as previously performed and 2010.  Trend CBC every 6 hours.  Transfuse if hemoglobin is less  than 8.0.  Continue IV hydration.  2 units of PRBC ordered to have on hand for transfusion if necessary.  2.  History of pan diverticulosis and recurrent bleeds.  Most recent bleed was November of 2011.  Uncertain if the patient would benefit from surgical consultation and will defer to GI.  3.  Recent shortness of breath with wheezing.  Currently on steroids which will be continued but at a lower dose.  Suspect the patient would benefit from outpatient pulmonary function tests if not already done.  Uncertain if the patient has underlying reactive airway disease.  4.  Type 2 diabetes uncontrolled with complications.  Will have the patient on sliding scale insulin.  Blood sugars elevated likely due to steroids.  Hold oral agents for the time being.  5. Hypertension.  Stable at this time.  Continue home medications with hold parameters.  6. Hyponatremia and hyperkalemia.  Resolved suspect hyperkalemia is likely due to hemolysis.  Hyponatremia improved on hydration.  7.  Obesity.  Diet and exercise as outpatient.  8.  Prophylaxis.  SCDs.    9.  CODE STATUS Full code this was  discussed with the patient at the time of admission.  Time spent on admission, talking to the patient, and coordinating care was: 50 mins.  Raeya Merritts A, MD 11/14/2011, 4:58 PM

## 2011-11-14 NOTE — ED Notes (Signed)
Up to bathroom. Large amount of bloody stool.

## 2011-11-14 NOTE — ED Notes (Signed)
Pt reports having large amount of bright red rectal bleeding this morning.

## 2011-11-14 NOTE — ED Notes (Signed)
MD at bedside; admitting MD

## 2011-11-14 NOTE — Consult Note (Addendum)
Patient seen and I agree with the above documentation, including the assessment and plan. 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 Presumed recurrent diverticular bleeding now, for now hemodynamics are stable. Spoke to IR directed and will plan stat tagged study +/- angio with embolization if positive. Again will discuss colectomy for recurrent LGI bleeding, but hopefully this will be elective and not emergent To be admitted by hospital team for management of other issues, hyperglycemia, low grade temp, electrolyte derangements.  Addendum: The recent consult H&P (dated 11/14/11) was reviewed, the patient was examined and there is no change in the patients condition since that H&P was completed.  Still with concern for diverticular bleeding requiring pRBC transfusion. Will proceed with colonoscopy.   PYRTLE, JAY M  11/17/2011, 1:46 PM

## 2011-11-14 NOTE — Consult Note (Signed)
[facility] Consultation  Referring Provider: [facility] Primary Care Physician:  Allean Found, MD, MD Primary Gastroenterologist:  Stan Head, MD Reason for Consultation:  Lower GI Bleed  HPI: [name] is a 55 y.o. female known to [place] for a history of pan-diverticulosis with recurrent diverticular bleeding.  In February 2010 she had a major bleed requiring several units of bleed. Colonoscopy at that time revealed a bleeding diverticulum at hepatic flexure. Despite attempts at hemostasis patient continued to bleed and ultimately underwent embolizaton of right colic artery. She was seen by surgery during that admission but since bleeding stopped after embolization, no surgical intervention was needed. In early November of this year patient was hospitalized again with recurrent bleeding. Patient comes to the ED after 4 episodes of painless hematochezia this am. She has since had 4 more episodes. She is slightly dizzy but otherwise feels okay.  Past Medical History  Diagnosis Date  . Diverticulosis of colon with hemorrhage 2010, 09/2011  . Hypertension   . Adenomatous colon polyp   . Diabetes mellitus   . Renal cell carcinoma 2004  . Blood transfusion   . IBS (irritable bowel syndrome)     Past Surgical History  Procedure Date  . Nephrectomy     left  . Tubal ligation   . Knee arthroscopy     x2 left  . Hand surgery     x5 right  . Abdominal hysterectomy   . Cholecystectomy   . Upper gastrointestinal endoscopy 12/12/2008    gastroporesis  . Flexible sigmoidoscopy 12/12/2008    diverticulosis  . Colonoscopy 12/19/2008    diverticulosis    Prior to Admission medications   Medication Sig Start Date End Date Taking? Authorizing Provider  amLODipine (NORVASC) 5 MG tablet Take 5 mg by mouth daily.     Yes Historical Provider, MD  glimepiride (AMARYL) 4 MG tablet Take 1 tablet (4 mg total) by mouth daily before breakfast. 09/22/11  Yes Novlet  Jarrett-Davis  hyoscyamine (LEVBID) 0.375 MG 12 hr tablet Take 0.375 mg by mouth as needed.  09/22/11  Yes Novlet Jarrett-Davis  mometasone-formoterol (DULERA) 100-5 MCG/ACT AERO Inhale 1 puff into the lungs 2 (two) times daily. Until flu/cold symptoms are over.   Yes Historical Provider, MD  predniSONE (DELTASONE) 20 MG tablet Take 20 mg by mouth daily. 7 day course  11/08/11 11/15/11 Yes Historical Provider, MD  ramipril (ALTACE) 10 MG tablet Take 10 mg by mouth daily.    Yes Historical Provider, MD  sitaGLIPtan-metformin (JANUMET) 50-500 MG per tablet Take 1 tablet by mouth 2 (two) times daily with a meal. 09/22/11  Yes Novlet Jarrett-Davis    Current Facility-Administered Medications  Medication Dose Route Frequency Provider Last Rate Last Dose  . 0.9 %  sodium chloride infusion   Intravenous Once Grant Fontana, [place]      . 0.9 %  sodium chloride infusion   Intravenous STAT Grant Fontana, [place]      . HYDROmorphone (DILAUDID) injection 1 mg  1 mg Intravenous Q4H PRN Grant Fontana, PA      . ondansetron [facility]) injection 4 mg  4 mg Intravenous Q8H PRN Grant Fontana, [place]      . sodium chloride 0.9 % bolus 1,000 mL  1,000 mL Intravenous Once Grant Fontana, PA   1,000 mL at 11/14/11 0928  . sodium chloride 0.9 % bolus 1,000 mL  1,000 mL Intravenous Once Grant Fontana, [place]       Current Outpatient Prescriptions  Medication Sig  Dispense Refill  . amLODipine (NORVASC) 5 MG tablet Take 5 mg by mouth daily.        Marland Kitchen glimepiride (AMARYL) 4 MG tablet Take 1 tablet (4 mg total) by mouth daily before breakfast.  30 tablet  0  . hyoscyamine (LEVBID) 0.375 MG 12 hr tablet Take 0.375 mg by mouth as needed.       . mometasone-formoterol (DULERA) 100-5 MCG/ACT AERO Inhale 1 puff into the lungs 2 (two) times daily. Until flu/cold symptoms are over.      . predniSONE (DELTASONE) 20 MG tablet Take 20 mg by mouth daily. 7 day course       . ramipril (ALTACE) 10 MG tablet Take 10 mg by mouth  daily.       . sitaGLIPtan-metformin (JANUMET) 50-500 MG per tablet Take 1 tablet by mouth 2 (two) times daily with a meal.  60 tablet  0    Allergies as of 11/14/2011  . (No Known Allergies)    Family History  Problem Relation Age of Onset  . Colon polyps Mother   . Diabetes Maternal Grandmother   . Diabetes Father   . Colon cancer Neg Hx    Review of Systems: Gen: Positive for dizziness. All other systems reviewed and negative except where noted in HPI PHYSICAL EXAM: Vital signs in last 24 hours: Temp:  [99.1 F (37.3 C)] 99.1 F (37.3 C) (12/31 0825) Pulse Rate:  [94-114] 94  (12/31 1245) Resp:  [13-18] 17  (12/31 1245) BP: (116-191)/(75-109) 148/75 mmHg (12/31 1230) SpO2:  [99 %-100 %] 100 % (12/31 1245)  General:  Pleasant obese black female in NAD Head:  Normocephalic and atraumatic. Eyes:   No icterus.   Conjunctiva pale. Ears:  Normal auditory acuity. Mouth:  Tongue moist. Neck:  Supple; no masses felt Lungs:  Respirations even and unlabored but there is diffuse Rhonci, wheezing heard in LUL. Congested cough.  Heart:  Slightly tachycardic Abdomen:  Soft, obese, nontender. Normal bowel sounds. No appreciable masses or hepatomegaly.  Rectal:  Small amount bright red blood in vault.   Msk:  Symmetrical without gross deformities.  Extremities:  Without edema. Neurologic:  Alert and  oriented x4;  grossly normal neurologically. Skin:  Intact without significant lesions or rashes. Cervical Nodes:  No significant cervical adenopathy. Psych:  Alert and cooperative. Normal mood and affect.  LAB RESULTS:  Basename 11/14/11 0915  WBC 11.8*  HGB 12.5  HCT 38.7  PLT 229   BMET  Basename 11/14/11 0915  NA 130*  K 6.1*  CL 98  CO2 22  GLUCOSE 395*  BUN 20  CREATININE 0.90  CALCIUM 9.0   LFT  Basename 11/14/11 0915  PROT 7.5  ALBUMIN 2.8*  AST 45*  ALT 32  ALKPHOS 109  BILITOT 0.2*  BILIDIR --  IBILI --   PREVIOUS ENDOSCOPIES: 1. Colonoscopy  [name]) February 2010 - FINDINGS: Moderate diverticulosis was found (pancolonic diverticulosis). There was a field of diverticulum at the hepatic flexure with recent red blood, clot. After multiple washings, and several minutes observation one of the diverticulum began to ooze blood. Visualization was quickly diminished but I injected 4cc dilute epinephrine at/near the site and then placed two endoclips at the site. Visualization was limited so that the clips were placed somewhat blindly but are at least within 1cm of the diverticulum. Oozing continued  ENDOSCOPIC IMPRESSION:  1) Pancolinic diverticulosis  2) Active bleeding from hepatic flexure diverticulum.   2.  EGD January 2010 [name]) -  ENDOSCOPIC IMPRESSION: 1) Collection of food debris in the body and the antrum of the stomach 2) Retroflexion exam demonstrated findings as previously described.  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  3. Flex Sig January 2010 [name]) - Severe diverticulosis was found in the sigmoid colon. NO ACTIVE BLEEDING/POST POLYP ULCCERTATION NOTED.LOTS OR TICS AND DARK CLOTTED HEME. Retroflexed views in the rectum revealed no abnormalities.   4. Colonoscopy January 2010 [name]) for GI bleed - ENDOSCOPIC IMPRESSION: 1) Moderate diverticulosis, no active bleeding but clinical scenario consistent with diverticular bleed. 2) 7 mm pedunculated polyp in the sigmoid colon, removed. 3) Otherwise normal examination  5. Colonoscopy May 2007 [name]) for abdominal pain/ history of RCC - Findings DIVERTICULOSIS: Ascending Colon to Transverse Colon. NORMAL EXAM: Cecum to Sigmoid Colon.  HEMORRHOIDS: Internal. Size: Grade I. I  IMPRESSION / PLAN: 52. 55 year old female with painless hematochezia, suspect recurrent diverticular bleed. Slightly tachycardic but otherwise stable. Her hemoglobin is 12.5 but based on amount of bleeding suspect a rapid fall. Support with IVF, PRBC as needed. I will prepare 2 units  of PRBCs to have on hand. It is unlikely site of bleeding could be visualized endoscopically. Even if site was localized it is questionable whether hemostasis could be achieved as we were unable to stop the bleeding during her 2010 episode.  We spoke to Dr. Fredia Sorrow (Interventional Radiology) and patient will go for RBC tagged scan. If scan positive she will have arteriogram / embolization as she did in 2010. This is what may prove to be the patient's 3rd or 4th diverticular bleed. It may be time for her to reconsider an elective colectomy.  2. History of pan-diverticulosis and recurrent diverticular bleeds. She required arteriogram with embolization in Feb 2010. Her last bleed was in November of this month at which time she bled down to around 8.4 then spontaneously stopped.   3. Diabetes, on oral agents. 4. HTN, treated. 5. Recent "flu". Currently on Prednisone for cough. 6. Electrolyte disturbances, sodium 130 and K+ 6.1.  Renal function normal.  7. Low grade temp / mildly elevated WBC. This may be pulmonary in origin.  Thanks   LOS: 0 days   Willette Cluster  11/14/2011, 12:47 PM

## 2011-11-15 DIAGNOSIS — D62 Acute posthemorrhagic anemia: Secondary | ICD-10-CM | POA: Clinically undetermined

## 2011-11-15 DIAGNOSIS — K922 Gastrointestinal hemorrhage, unspecified: Secondary | ICD-10-CM

## 2011-11-15 HISTORY — PX: NEPHRECTOMY: SHX65

## 2011-11-15 LAB — CBC
HCT: 26.6 % — ABNORMAL LOW (ref 36.0–46.0)
Hemoglobin: 8.6 g/dL — ABNORMAL LOW (ref 12.0–15.0)
Hemoglobin: 8.5 g/dL — ABNORMAL LOW (ref 12.0–15.0)
Hemoglobin: 8.5 g/dL — ABNORMAL LOW (ref 12.0–15.0)
MCH: 27.7 pg (ref 26.0–34.0)
MCH: 27.1 pg (ref 26.0–34.0)
MCHC: 32 g/dL (ref 30.0–36.0)
Platelets: 163 10*3/uL (ref 150–400)
RBC: 3.11 MIL/uL — ABNORMAL LOW (ref 3.87–5.11)
RBC: 3.14 MIL/uL — ABNORMAL LOW (ref 3.87–5.11)
RBC: 2.97 MIL/uL — ABNORMAL LOW (ref 3.87–5.11)
WBC: 12.3 10*3/uL — ABNORMAL HIGH (ref 4.0–10.5)

## 2011-11-15 LAB — GLUCOSE, CAPILLARY
Glucose-Capillary: 263 mg/dL — ABNORMAL HIGH (ref 70–99)
Glucose-Capillary: 244 mg/dL — ABNORMAL HIGH (ref 70–99)
Glucose-Capillary: 208 mg/dL — ABNORMAL HIGH (ref 70–99)
Glucose-Capillary: 228 mg/dL — ABNORMAL HIGH (ref 70–99)

## 2011-11-15 MED ORDER — GLIMEPIRIDE 4 MG PO TABS
4.0000 mg | ORAL_TABLET | Freq: Every day | ORAL | Status: DC
  Administered 2011-11-16 – 2011-11-20 (×4): 4 mg via ORAL
  Filled 2011-11-15 (×5): qty 1

## 2011-11-15 MED ORDER — PREDNISONE 5 MG PO TABS
5.0000 mg | ORAL_TABLET | Freq: Every day | ORAL | Status: DC
  Filled 2011-11-15: qty 1

## 2011-11-15 MED ORDER — ACETAMINOPHEN 325 MG PO TABS
650.0000 mg | ORAL_TABLET | Freq: Once | ORAL | Status: AC
  Administered 2011-11-15: 650 mg via ORAL
  Filled 2011-11-15: qty 2

## 2011-11-15 MED ORDER — HYOSCYAMINE SULFATE ER 0.375 MG PO TB12
0.3750 mg | ORAL_TABLET | Freq: Two times a day (BID) | ORAL | Status: DC | PRN
  Filled 2011-11-15: qty 1

## 2011-11-15 MED ORDER — DIPHENHYDRAMINE HCL 25 MG PO CAPS
25.0000 mg | ORAL_CAPSULE | Freq: Once | ORAL | Status: AC
  Administered 2011-11-15: 25 mg via ORAL
  Filled 2011-11-15: qty 1

## 2011-11-15 MED ORDER — RAMIPRIL 10 MG PO CAPS
10.0000 mg | ORAL_CAPSULE | Freq: Every day | ORAL | Status: DC
  Administered 2011-11-18 – 2011-11-20 (×3): 10 mg via ORAL
  Filled 2011-11-15 (×6): qty 1

## 2011-11-15 MED ORDER — MOMETASONE FURO-FORMOTEROL FUM 100-5 MCG/ACT IN AERO
1.0000 | INHALATION_SPRAY | Freq: Two times a day (BID) | RESPIRATORY_TRACT | Status: DC
  Filled 2011-11-15: qty 13

## 2011-11-15 MED ORDER — RAMIPRIL 10 MG PO TABS: 10.0000 mg | ORAL_TABLET | Freq: Every day | ORAL | Status: DC

## 2011-11-15 MED ORDER — AMLODIPINE BESYLATE 5 MG PO TABS
5.0000 mg | ORAL_TABLET | Freq: Every day | ORAL | Status: DC
  Administered 2011-11-18 – 2011-11-20 (×3): 5 mg via ORAL
  Filled 2011-11-15 (×6): qty 1

## 2011-11-15 MED ORDER — INSULIN GLARGINE 100 UNIT/ML ~~LOC~~ SOLN
10.0000 [IU] | Freq: Every day | SUBCUTANEOUS | Status: DC
  Administered 2011-11-15 – 2011-11-19 (×5): 10 [IU] via SUBCUTANEOUS
  Filled 2011-11-15: qty 3

## 2011-11-15 NOTE — Progress Notes (Signed)
Inpatient Diabetes Program Recommendations  AACE/ADA: New Consensus Statement on Inpatient Glycemic Control (2009)  Target Ranges:  Prepandial:   less than 140 mg/dL      Peak postprandial:   less than 180 mg/dL (1-2 hours)      Critically ill patients:  140 - 180 mg/dL   Reason for Visit: Hyperglycemia  Inpatient Diabetes Program Recommendations Insulin - Basal: Add Lantus 10 units QHS Insulin - Meal Coverage: Add Novolog 3 units tidwc HgbA1C: Last HgbA1C - 10.1 on 01/05/2010 - needs updated HgbA1C to assess glycemic control  CBGs 240, 263, 244, 208

## 2011-11-15 NOTE — Progress Notes (Signed)
Subjective: Patient reported that she had another bloody bowel movement today.  Breathing better.  Objective: Vital signs in last 24 hours: Filed Vitals:   11/15/11 1215 11/15/11 1245 11/15/11 1345 11/15/11 1445  BP: 111/71 128/85 134/86 136/85  Pulse: 108 108 105 109  Temp: 98.3 F (36.8 C) 98.5 F (36.9 C) 98.3 F (36.8 C) 99.2 F (37.3 C)  TempSrc: Oral Oral Oral Oral  Resp: 18 18 18 18   Height:      Weight:      SpO2:       Weight change:   Intake/Output Summary (Last 24 hours) at 11/15/11 1907 Last data filed at 11/15/11 1545  Gross per 24 hour  Intake   3241 ml  Output   2150 ml  Net   1091 ml    Physical Exam: General: Awake, Oriented, No acute distress. HEENT: EOMI. Neck: Supple CV: S1 and S2 Lungs: Clear to ascultation bilaterally Abdomen: Soft, Nontender, Nondistended, +bowel sounds. Ext: Good pulses. Trace edema.  Lab Results:  [facility] 11/14/11 1225 11/14/11 0915  NA 135 130*  K 3.9 6.1*  CL 105 98  CO2 21 22  GLUCOSE 385* 395*  BUN 20 20  CREATININE 0.95 0.90  CALCIUM 8.1* 9.0  MG -- --  PHOS -- --    Basename 11/14/11 0915  AST 45*  ALT 32  ALKPHOS 109  BILITOT 0.2*  PROT 7.5  ALBUMIN 2.8*   No results found for this basename: LIPASE:2,AMYLASE:2 in the last 72 hours  Basename 11/15/11 0420 11/15/11 0046 11/14/11 0915  WBC 9.8 11.1* --  NEUTROABS -- -- 6.6  HGB 8.5* 8.6* --  HCT 26.6* 26.0* --  MCV 84.7 83.6 --  PLT 185 189 --   No results found for this basename: CKTOTAL:3,CKMB:3,CKMBINDEX:3,TROPONINI:3 in the last 72 hours No components found with this basename: POCBNP:3 No results found for this basename: DDIMER:2 in the last 72 hours No results found for this basename: HGBA1C:2 in the last 72 hours No results found for this basename: CHOL:2,HDL:2,LDLCALC:2,TRIG:2,CHOLHDL:2,LDLDIRECT:2 in the last 72 hours No results found for this basename: TSH,T4TOTAL,FREET3,T3FREE,THYROIDAB in the last 72 hours No results found for this  basename: VITAMINB12:2,FOLATE:2,FERRITIN:2,TIBC:2,IRON:2,RETICCTPCT:2 in the last 72 hours  Micro Results: No results found for this or any previous visit (from the past 240 hour(s)).  Studies/Results: Nm Gi Blood Loss  11/14/2011  *RADIOLOGY REPORT*  Clinical Data: Recurrent diverticular GI bleed.  NUCLEAR MEDICINE GASTROINTESTINAL BLEEDING STUDY  Technique:  Sequential abdominal images were obtained following intravenous administration of Tc-34m labeled red blood cells.  Radiopharmaceutical: 23.55 mCi technetium 80m labeled red blood cells  Comparison: 12/16/2008  Findings: Imaging shows equivocal activity over the first 60 minutes near the liver.  This activity does not show significant transit and is not as active as blood pool.  This activity is also not nearly as strong as activity noted during a positive bleeding scan 2 years ago.  IMPRESSION: No grossly positive active GI bleed is identified. Equivocal activity is present near the liver.  Original Report Authenticated By: Reola Calkins, M.D.    Medications: I have reviewed the patient's current medications. Scheduled Meds:   . sodium chloride   Intravenous Once  . sodium chloride   Intravenous STAT  . acetaminophen  650 mg Oral Once  . diphenhydrAMINE  25 mg Oral Once  . insulin aspart  0-15 Units Subcutaneous 6 X Daily  . sodium chloride  1,000 mL Intravenous Once  . DISCONTD: insulin aspart  0-15 Units  Subcutaneous TID WC  . DISCONTD: insulin aspart  0-5 Units Subcutaneous QHS   Continuous Infusions:  PRN Meds:.acetaminophen, acetaminophen, albuterol, ipratropium, ondansetron (ZOFRAN) IV, oxyCODONE, zolpidem  Assessment/Plan: 1. BRBPR (bright red blood per rectum)/painless hematochezia, is likely due to recurrent diverticular bleed. Appreciate GI input.  Patient had a tagged RBC scan which showed no grossly positive active GI bleed, equivocal activity is present near the liver.  Patient was transfused one unit of PRBC on  11/15/2011. Trend CBC every 12 hours. Continue IV hydration.   2. History of pan diverticulosis and recurrent bleeds. Most recent bleed was November of 2011. Uncertain if the patient would benefit from surgical consultation and will defer to GI.   3. Recent shortness of breath with wheezing. Currently on steroids 5 mg of prednisone now for 3 days as compared to 20 milligrams a prednisone. Suspect the patient would benefit from outpatient pulmonary function tests if not already done. Uncertain if the patient has underlying reactive airway disease.   4. Type 2 diabetes uncontrolled with complications. Will have the patient on sliding scale insulin. Hold oral agents for the time being.  Start basal insulin, Lantus, given the patient has elevated blood sugars due to steroids.  5. Hypertension. Stable at this time. Continue home medications with hold parameters.   6. Hyponatremia and hyperkalemia. Resolved suspect hyperkalemia is likely due to hemolysis. Hyponatremia improved on hydration.   7. Morbid obesity. Diet and exercise as outpatient.   8. Prophylaxis. SCDs.   9. CODE STATUS Full code.  10.  Disposition.  Pending.   LOS: 1 day  Emanie Behan A, MD 11/15/2011, 7:07 PM

## 2011-11-15 NOTE — Progress Notes (Signed)
Patient seen, examined and I agree with the above documentation, including the assessment and plan. Pt with presumed recurrent diverticular hemorrhage.  Last bleed was in Nov 2012. Had 1 bloody stool this am, then 2-3 additional dark red, more scant stools throughout the day. Still no pain. Got 1u pRBC today. Cycling HCTs For now will continue observation.  May repeat colonoscopy if bleeding continues since were not able to perform angio after neg tagged scan. Will follow.

## 2011-11-15 NOTE — Progress Notes (Signed)
[facility] Progress Note  SUBJECTIVE: no bleeding since RBC scan yesterday until a few minutes ago. Prior to that she had a normal brown stool around 7:30am.   OBJECTIVE:  Vital signs in last 24 hours: Temp:  [98.5 F (36.9 C)-98.8 F (37.1 C)] 98.7 F (37.1 C) (01/01 0615) Pulse Rate:  [94-111] 104  (01/01 0615) Resp:  [13-21] 18  (01/01 0615) BP: (116-157)/(74-94) 124/86 mmHg (01/01 0615) SpO2:  [99 %-100 %] 100 % (01/01 0615) Weight:  [108.41 kg (239 lb)-113.5 kg (250 lb 3.6 oz)] 241 lb 13.5 oz (109.7 kg) (01/01 0615) Last BM Date: 11/15/11 General:   in NAD Heart:  Slightly tachycardic Lungs: No wheezing today but a few coarse sounds throughtout. Occasional cough.  Abdomen:  Soft, obese, nontender. Normal bowel sounds. Neurologic:  Alert and  oriented x4;  grossly normal neurologically. Psych:  Pleasant, cooperative. Normal mood and affect.   Lab Results:  Basename 11/15/11 0420 11/15/11 0046 11/14/11 1752  WBC 9.8 11.1* 11.2*  HGB 8.5* 8.6* 9.5*  HCT 26.6* 26.0* 28.9*  PLT 185 189 192   BMET  Basename 11/14/11 1225 11/14/11 0915  NA 135 130*  K 3.9 6.1*  CL 105 98  CO2 21 22  GLUCOSE 385* 395*  BUN 20 20  CREATININE 0.95 0.90  CALCIUM 8.1* 9.0   LFT  Basename 11/14/11 0915  PROT 7.5  ALBUMIN 2.8*  AST 45*  ALT 32  ALKPHOS 109  BILITOT 0.2*  BILIDIR --  IBILI --   Studies/Results: Nm Gi Blood Loss  11/14/2011  *RADIOLOGY REPORT*  Clinical Data: Recurrent diverticular GI bleed.  NUCLEAR MEDICINE GASTROINTESTINAL BLEEDING STUDY  Technique:  Sequential abdominal images were obtained following intravenous administration of Tc-24m labeled red blood cells.  Radiopharmaceutical: 23.55 mCi technetium 43m labeled red blood cells  Comparison: 12/16/2008  Findings: Imaging shows equivocal activity over the first 60 minutes near the liver.  This activity does not show significant transit and is not as active as blood pool.  This activity is also not  nearly as strong as activity noted during a positive bleeding scan 2 years ago.  IMPRESSION: No grossly positive active GI bleed is identified. Equivocal activity is present near the liver.  Original Report Authenticated By: Reola Calkins, M.D.   ASSESSMENT / PLAN: 59. 56 year old female with painless hematochezia, suspect recurrent diverticular bleed. Bleeding has definitely slowed. No blood loss through the night. She had a normal nonbloody stool this am but then just had another episode of rectal bleeding. Toilet water is red. Despite significant bleed her RBC tagged scan was unrevealing. There was only questionable activity over the first 60 minutes near the liver. Slightly tachycardic. She does get dizzy when up to bathroom but otherwise stable. Her hemoglobin has dropped from 12.5 to 8.5 overnight. Will give her 1 unit of blood as she is somewhat symptomatic. Depending on clinical course she may need repeat imaging, colonoscopy of neither if she stops spontaneously.  It is questionable whether we could locate site / achieve hemostasis endoscopically. This is patient's 3rd or 4th diverticular bleed. It may be time for her to reconsider an elective colectomy.  2. History of pan-diverticulosis and recurrent diverticular bleeds. She required arteriogram with embolization in Feb 2010. Endoscopic attempt to stop bleeding was unsuccessful. Her last bleed was in November of this month at which time she bled down to around 8.4 then spontaneously stopped.  3. Diabetes, on oral agents.  4. HTN, treated.  5.  Recent "flu". Was on Prednisone at home for cough.     LOS: 1 day   Willette Cluster  11/15/2011, 8:54 AM

## 2011-11-15 NOTE — ED Provider Notes (Signed)
Medical screening examination/treatment/procedure(s) were performed by non-physician practitioner and as supervising physician I was immediately available for consultation/collaboration.  Hurman Horn, MD 11/15/11 [phone]

## 2011-11-16 DIAGNOSIS — D62 Acute posthemorrhagic anemia: Secondary | ICD-10-CM

## 2011-11-16 DIAGNOSIS — K5731 Diverticulosis of large intestine without perforation or abscess with bleeding: Principal | ICD-10-CM

## 2011-11-16 LAB — CBC
Hemoglobin: 7.2 g/dL — ABNORMAL LOW (ref 12.0–15.0)
Hemoglobin: 9.3 g/dL — ABNORMAL LOW (ref 12.0–15.0)
MCH: 28.1 pg (ref 26.0–34.0)
MCH: 28.2 pg (ref 26.0–34.0)
MCHC: 33 g/dL (ref 30.0–36.0)
MCHC: 34.6 g/dL (ref 30.0–36.0)
Platelets: 165 10*3/uL (ref 150–400)
Platelets: 186 10*3/uL (ref 150–400)
RBC: 2.56 MIL/uL — ABNORMAL LOW (ref 3.87–5.11)
RDW: 16.1 % — ABNORMAL HIGH (ref 11.5–15.5)

## 2011-11-16 LAB — BASIC METABOLIC PANEL
CO2: 24 mEq/L (ref 19–32)
Calcium: 7.9 mg/dL — ABNORMAL LOW (ref 8.4–10.5)
GFR calc non Af Amer: 63 mL/min — ABNORMAL LOW (ref >90)
Glucose, Bld: 191 mg/dL — ABNORMAL HIGH (ref 70–99)
Potassium: 3.8 mEq/L (ref 3.5–5.1)
Sodium: 139 mEq/L (ref 135–145)

## 2011-11-16 LAB — GLUCOSE, CAPILLARY
Glucose-Capillary: 181 mg/dL — ABNORMAL HIGH (ref 70–99)
Glucose-Capillary: 225 mg/dL — ABNORMAL HIGH (ref 70–99)
Glucose-Capillary: 235 mg/dL — ABNORMAL HIGH (ref 70–99)

## 2011-11-16 MED ORDER — SODIUM CHLORIDE 0.9 % IV SOLN
INTRAVENOUS | Status: DC
  Administered 2011-11-17 – 2011-11-19 (×5): via INTRAVENOUS

## 2011-11-16 MED ORDER — PEG-KCL-NACL-NASULF-NA ASC-C 100 G PO SOLR
1.0000 | Freq: Once | ORAL | Status: AC
  Administered 2011-11-16: 100 g via ORAL
  Filled 2011-11-16: qty 1

## 2011-11-16 NOTE — Progress Notes (Signed)
Patient seen and I agree with the above documentation, including the assessment and plan. No further rectal bleeding this am. Getting pRBC now x 2 Will watch closely, follow counts, if rebleeding recurs at this point, will plan prep and colonoscopy  Pt still without pain, tolerating liquids.

## 2011-11-16 NOTE — Progress Notes (Signed)
Subjective: Asymptomatic this morning.  Objective: Vital signs in last 24 hours: Temp:  [98.2 F (36.8 C)-99.2 F (37.3 C)] 98.8 F (37.1 C) (01/02 1005) Pulse Rate:  [99-109] 101  (01/02 1005) Resp:  [16-20] 20  (01/02 1005) BP: (110-136)/(71-86) 112/74 mmHg (01/02 1005) SpO2:  [98 %-100 %] 100 % (01/02 0602) Weight:  [112.4 kg (247 lb 12.8 oz)] 247 lb 12.8 oz (112.4 kg) (01/02 0602) Weight change: 3.99 kg (8 lb 12.8 oz) Last BM Date: 11/15/11  Intake/Output from previous day: 01/01 0701 - 01/02 0700 In: 1091 [I.V.:500; Blood:591] Out: 151 [Urine:150; Stool:1] Total I/O In: 490 [P.O.:240; I.V.:250] Out: 200 [Urine:200]   Physical Exam: General: Comfortable, alert, communicative, fully oriented, not short of breath at rest.  HEENT:  Moderate clinical pallor, no jaundice, no conjunctival injection or discharge. Hydration status is fair. NECK:  Supple, JVP not seen, no carotid bruits, no palpable lymphadenopathy, no palpable goiter. CHEST:  Clinically clear to auscultation, no wheezes, no crackles. HEART:  Sounds 1 and 2 heard, normal, regular, no murmurs. ABDOMEN:  Obese, soft, non-tender, no palpable organomegaly, no palpable masses, normal bowel sounds. GENITALIA:  Not examined. LOWER EXTREMITIES:  No pitting edema, palpable peripheral pulses. MUSCULOSKELETAL SYSTEM:  Unremarkable. CENTRAL NERVOUS SYSTEM:  No focal neurologic deficit on gross examination.  Lab Results:  [facility] 11/16/11 0524 11/15/11 1803  WBC 11.7* 12.3*  HGB 7.2* 8.5*  HCT 21.8* 25.1*  PLT 165 163    Basename 11/16/11 0524 11/14/11 1225  NA 139 135  K 3.8 3.9  CL 109 105  CO2 24 21  GLUCOSE 191* 385*  BUN 15 20  CREATININE 1.00 0.95  CALCIUM 7.9* 8.1*   No results found for this or any previous visit (from the past 240 hour(s)).   Studies/Results: Nm Gi Blood Loss  11/14/2011  *RADIOLOGY REPORT*  Clinical Data: Recurrent diverticular GI bleed.  NUCLEAR MEDICINE GASTROINTESTINAL  BLEEDING STUDY  Technique:  Sequential abdominal images were obtained following intravenous administration of Tc-16m labeled red blood cells.  Radiopharmaceutical: 23.55 mCi technetium 27m labeled red blood cells  Comparison: 12/16/2008  Findings: Imaging shows equivocal activity over the first 60 minutes near the liver.  This activity does not show significant transit and is not as active as blood pool.  This activity is also not nearly as strong as activity noted during a positive bleeding scan 2 years ago.  IMPRESSION: No grossly positive active GI bleed is identified. Equivocal activity is present near the liver.  Original Report Authenticated By: Reola Calkins, M.D.    Medications: Scheduled Meds:   . sodium chloride   Intravenous Once  . acetaminophen  650 mg Oral Once  . amLODipine  5 mg Oral Daily  . diphenhydrAMINE  25 mg Oral Once  . glimepiride  4 mg Oral QAC breakfast  . insulin aspart  0-15 Units Subcutaneous 6 X Daily  . insulin glargine  10 Units Subcutaneous QHS  . mometasone-formoterol  1 puff Inhalation BID  . predniSONE  5 mg Oral Q breakfast  . ramipril  10 mg Oral Daily  . sodium chloride  1,000 mL Intravenous Once  . DISCONTD: predniSONE  5 mg Oral Q breakfast  . DISCONTD: ramipril  10 mg Oral Daily   Continuous Infusions:   . sodium chloride     PRN Meds:.acetaminophen, acetaminophen, albuterol, hyoscyamine, ipratropium, oxyCODONE, zolpidem  Assessment/Plan:  1. BRBPR (bright red blood per rectum)/painless hematochezia: This is secondary to recurrent diverticular bleed. Patient had a tagged  RBC scan which showed no grossly positive active GI bleed, equivocal activity is present near the liver. Patient is status post transfusion of one unit of PRBC on 11/15/2011. 2 units will be transfused today, due to progressive anemia.Trend CBC every 12 hours. Continue IV hydration.  2. History of pan-diverticulosis and recurrent bleeds: Most recent bleed was November of  2011. Uncertain if the patient would benefit from surgical consultation and will defer to GI.  3. Recent shortness of breath with wheezing: Currently on steroids 5 mg of prednisone now for 4 days, as compared to 20 milligrams of prednisone. Patient does not appear wheezy today, or in obvious respiratory distress. Will discontinue Prednisone. Patient would benefit from outpatient pulmonary function tests to rule out reactive airway disease.  4. Type 2 diabetes uncontrolled with complications: Patient is on sliding scale insulin/Lantus. Oral agents currently on hold. Steroids may be contributory.  5. Hypertension. Controlled on home medications.  6. Hyponatremia and hyperkalemia. Resolved. Suspect hyperkalemia is likely due to hemolysis. Hyponatremia improved on hydration.   Comment: Per GI, may need therapeutic colonoscopy.    LOS: 2 days   Khaza Blansett,[name] 11/16/2011, 10:08 AM

## 2011-11-16 NOTE — Progress Notes (Signed)
Patient ID: [name], female   DOB: October 10, 1956, 56 y.o.   MRN: [id number] [facility] Progress Note  Subjective: Up on bedside commode-winded just getting up to BR. Last BM was 9pm last night-"dark blood and tarry stuff". She feels weak and SOB, dizzy with standing.  Objective:  Vital signs in last 24 hours: Temp:  [98.2 F (36.8 C)-99.2 F (37.3 C)] 98.3 F (36.8 C) (01/02 0602) Pulse Rate:  [99-109] 104  (01/02 0602) Resp:  [16-20] 20  (01/02 0602) BP: (110-136)/(71-86) 122/80 mmHg (01/02 0602) SpO2:  [98 %-100 %] 100 % (01/02 0602) Weight:  [112.4 kg (247 lb 12.8 oz)] 247 lb 12.8 oz (112.4 kg) (01/02 0602) Last BM Date: 11/15/11 General:   Alert,  Well-developed, female  In NAD, sob Heart: tachy Regular rate and rhythm; no murmurs Pulm;clear Abdomen:  Soft, nontender and nondistended. Normal bowel sounds, without guarding, and without rebound.   Extremities:  Without edema. Neurologic:  Alert and  oriented x4;  grossly normal neurologically. Psych:  Alert and cooperative. Normal mood and affect.  Intake/Output from previous day: 01/01 0701 - 01/02 0700 In: 1091 [I.V.:500; Blood:591] Out: 151 [Urine:150; Stool:1] Intake/Output this shift:    Lab Results:  Basename 11/16/11 0524 11/15/11 1803 11/15/11 0420  WBC 11.7* 12.3* 9.8  HGB 7.2* 8.5* 8.5*  HCT 21.8* 25.1* 26.6*  PLT 165 163 185   BMET  Basename 11/16/11 0524 11/14/11 1225 11/14/11 0915  NA 139 135 130*  K 3.8 3.9 6.1*  CL 109 105 98  CO2 24 21 22   GLUCOSE 191* 385* 395*  BUN 15 20 20   CREATININE 1.00 0.95 0.90  CALCIUM 7.9* 8.1* 9.0   LFT  Basename 11/14/11 0915  PROT 7.5  ALBUMIN 2.8*  AST 45*  ALT 32  ALKPHOS 109  BILITOT 0.2*  BILIDIR --  IBILI --     Assessment / Plan:  1#  56 yo female with recurrent diverticular bleeding-known pandiverticulosis. Hopefully has stopped. Will observe closely today-if rebleeding then will prep for therapeutic colonoscopy.restart IV  fluids #2 Anemia -secondary to blood loss-transfuse 2 units now,keep hgb above 8. Principal Problem:  *BRBPR (bright red blood per rectum) Active Problems:  DM (diabetes mellitus), type 2 with complications  HYPERTENSION  Diverticulosis of colon with hemorrhage - recurrent  IBS (irritable bowel syndrome)  Shortness of breath  Anemia due to blood loss, acute     LOS: 2 days   Amy Esterwood  11/16/2011, 9:00 AM

## 2011-11-17 ENCOUNTER — Encounter (HOSPITAL_COMMUNITY)
Admission: EM | Disposition: A | Discharge status: Home / Self Care | Payer: Self-pay | Source: Home / Self Care | Attending: Internal Medicine

## 2011-11-17 ENCOUNTER — Encounter (HOSPITAL_COMMUNITY): Payer: Self-pay | Admitting: *Deleted

## 2011-11-17 HISTORY — PX: COLONOSCOPY: SHX5424

## 2011-11-17 LAB — COMPREHENSIVE METABOLIC PANEL
ALT: 19 U/L (ref 0–35)
AST: 16 U/L (ref 0–37)
Albumin: 2.1 g/dL — ABNORMAL LOW (ref 3.5–5.2)
CO2: 21 mEq/L (ref 19–32)
Chloride: 110 mEq/L (ref 96–112)
Creatinine, Ser: 0.94 mg/dL (ref 0.50–1.10)
Sodium: 140 mEq/L (ref 135–145)
Total Bilirubin: 0.2 mg/dL — ABNORMAL LOW (ref 0.3–1.2)

## 2011-11-17 LAB — CBC
HCT: 25.2 % — ABNORMAL LOW (ref 36.0–46.0)
Hemoglobin: 8.6 g/dL — ABNORMAL LOW (ref 12.0–15.0)
MCH: 28.1 pg (ref 26.0–34.0)
MCV: 82.7 fL (ref 78.0–100.0)
Platelets: 154 10*3/uL (ref 150–400)
RBC: 2.71 MIL/uL — ABNORMAL LOW (ref 3.87–5.11)
RBC: 3.06 MIL/uL — ABNORMAL LOW (ref 3.87–5.11)
RDW: 16.7 % — ABNORMAL HIGH (ref 11.5–15.5)
WBC: 11 10*3/uL — ABNORMAL HIGH (ref 4.0–10.5)

## 2011-11-17 LAB — GLUCOSE, CAPILLARY
Glucose-Capillary: 150 mg/dL — ABNORMAL HIGH (ref 70–99)
Glucose-Capillary: 156 mg/dL — ABNORMAL HIGH (ref 70–99)
Glucose-Capillary: 191 mg/dL — ABNORMAL HIGH (ref 70–99)

## 2011-11-17 LAB — PREPARE RBC (CROSSMATCH)

## 2011-11-17 SURGERY — COLONOSCOPY
Anesthesia: Moderate Sedation

## 2011-11-17 MED ORDER — PREDNISOLONE 5 MG PO TABS
10.0000 mg | ORAL_TABLET | Freq: Every day | ORAL | Status: AC
  Filled 2011-11-17 (×3): qty 2

## 2011-11-17 MED ORDER — FENTANYL NICU IV SYRINGE 50 MCG/ML
INJECTION | INTRAMUSCULAR | Status: DC | PRN
  Administered 2011-11-17 (×3): 25 ug via INTRAVENOUS

## 2011-11-17 MED ORDER — MIDAZOLAM HCL 10 MG/2ML IJ SOLN
INTRAMUSCULAR | Status: DC | PRN
  Administered 2011-11-17 (×2): 2 mg via INTRAVENOUS
  Administered 2011-11-17 (×3): 1 mg via INTRAVENOUS

## 2011-11-17 NOTE — Op Note (Signed)
[facility] [address] [place], [place]  [address]  COLONOSCOPY PROCEDURE REPORT  PATIENT:  Allis, Quirarte  MR#:  [id number] BIRTHDATE:  [birth year], 55 yrs. old  GENDER:  female ENDOSCOPIST:  Carie Caddy. Chun Sellen, MD REF. BY:  [facility], PROCEDURE DATE:  11/17/2011 PROCEDURE:  Diagnostic Colonoscopy ASA CLASS:  Class II INDICATIONS:  hematochezia, melena MEDICATIONS:   Fentanyl 75 mcg IV, Versed 7 mg IV  DESCRIPTION OF PROCEDURE:   After the risks benefits and alternatives of the procedure were thoroughly explained, informed consent was obtained.  Digital rectal exam was performed and revealed no rectal masses.   The Pentax Colonoscope [id number] endoscope was introduced through the anus and advanced to the cecum, which was identified by both the appendix and ileocecal valve, without limitations.  The quality of the prep was poor, using MoviPrep.  The instrument was then slowly withdrawn as the colon was fully examined. <<PROCEDUREIMAGES>>  FINDINGS:  Fresh blood and clots was found from ascending colon to the rectum.  The red blood stopped just short f the cecum.  Blood obscured complete visualization despite copious irrigation and lavage.   Moderate diverticulosis was found in the ascending colon/hepatic flexure and sigmoid colon.  Much time was spent irrigating and inspecting the diverticular orifices, however the site of recent bleeding was not able to be found.  The transverse and descending colon was relatively spared of diverticulosis.   A 6 mm sessile polyp was found in the sigmoid colon.  Polypectomy was not attempt given recent bleeding.   Retroflexed views in the rectum revealed no abnormalities.  The scope was then withdrawn from the cecum and the procedure completed.  COMPLICATIONS:  None  ENDOSCOPIC IMPRESSION: 1) Blood ascending colon to rectum (no blood noted in cecum) 2) Moderate diverticulosis ascending colon/hepatic flexure and sigmoid  colon.  Transverse colon and descending colon relatively spared of diverticulosis. 3) Sessile polyp in the sigmoid colon.  Polypectomy not attempted due to recent significant bleeding.  RECOMMENDATIONS: 1) Continue to closely monitor Hgb/HCT and transfuse to keep Hgb > 8.0 2) If active rebleeding, would recommend repeating tagged red cell scan followed by angiography/embolization, as the etiology is felt to be diverticular in origin. 3) Consideration of surgical resection (i.e. right hemicolectomy) given recurrent diverticular bleeding. Based on today's and previous findings, the most likely source of hemorrhage is felt to be distal ascending/hepatic flexure (though this can not be known for sure). 4) Repeat colonoscopy for polypectomy should  be done as an outpatient within 12 months when bleeding resolves.  Carie Caddy. Ayonna Speranza, MD  CC:  The Patient  n. eSIGNED:   Carie Caddy. Valmore Arabie at 11/17/2011 03:04 PM  [place], [phone]

## 2011-11-17 NOTE — Progress Notes (Signed)
Patient ID: [name], female   DOB: 1955-12-01, 56 y.o.   MRN: [id number] [facility] Progress Note  Subjective: [name] continuesd to bleed with prep last evening-last BM about 1am. She feels OK, weak, and lightheaded when up. Last hgb7.6 after 3 units of blood total  Objective:  Vital signs in last 24 hours: Temp:  [97.6 F (36.4 C)-99.1 F (37.3 C)] 97.6 F (36.4 C) (01/03 0540) Pulse Rate:  [90-106] 104  (01/03 0540) Resp:  [18-20] 20  (01/03 0540) BP: (112-146)/(74-90) 146/84 mmHg (01/03 0540) SpO2:  [100 %] 100 % (01/03 0540) Weight:  [110 kg (242 lb 8.1 oz)] 242 lb 8.1 oz (110 kg) (01/03 0540) Last BM Date: 11/16/11 General:   Alert,  Well-developed,    in NAD Heart:  Regular rate and rhythm; no murmurs,slightly tachy Pulm;clear Abdomen:  Soft, nontender and nondistended. Normal bowel sounds, without guarding,   Extremities:  Without edema. Neurologic:  Alert and  oriented x4;  grossly normal neurologically. Psych:  Alert and cooperative. Normal mood and affect.  Intake/Output from previous day: 01/02 0701 - 01/03 0700 In: 2846 [P.O.:240; I.V.:2035; Blood:571] Out: 510 [Urine:500; Stool:10] Intake/Output this shift:    Lab Results:  Basename 11/17/11 0450 11/16/11 1925 11/16/11 0524  WBC 11.0* 14.4* 11.7*  HGB 7.6* 9.3* 7.2*  HCT 22.4* 26.9* 21.8*  PLT 154 186 165   BMET  Basename 11/17/11 0450 11/16/11 0524 11/14/11 1225  NA 140 139 135  K 3.7 3.8 3.9  CL 110 109 105  CO2 21 24 21   GLUCOSE 184* 191* 385*  BUN 10 15 20   CREATININE 0.94 1.00 0.95  CALCIUM 7.5* 7.9* 8.1*   LFT  Basename 11/17/11 0450  PROT 4.6*  ALBUMIN 2.1*  AST 16  ALT 19  ALKPHOS 56  BILITOT 0.2*  BILIDIR --  IBILI --       Assessment / Plan: #1 56 yo female with recurrent diverticular bleeding. She is for colonoscopy this am. No active bleeding past several houirs, she is hemodynamically stable. #2 Anemia-acute secondary to blood loss- will give  one more unit this am, keep above 8. Principal Problem:  *BRBPR (bright red blood per rectum) Active Problems:  DM (diabetes mellitus), type 2 with complications  HYPERTENSION  Diverticulosis of colon with hemorrhage - recurrent  IBS (irritable bowel syndrome)  Shortness of breath  Anemia due to blood loss, acute     LOS: 3 days   Horton Ellithorpe  11/17/2011, 8:54 AM

## 2011-11-17 NOTE — Progress Notes (Signed)
Subjective: No further hematochezia noted, per patient.  Objective: Vital signs in last 24 hours: Temp:  [97.6 F (36.4 C)-99 F (37.2 C)] 98.4 F (36.9 C) (01/03 1323) Pulse Rate:  [90-106] 100  (01/03 1445) Resp:  [13-95] 19  (01/03 1600) BP: (125-158)/(55-92) 127/65 mmHg (01/03 1600) SpO2:  [98 %-100 %] 99 % (01/03 1600) Weight:  [110 kg (242 lb 8.1 oz)] 242 lb 8.1 oz (110 kg) (01/03 0540) Weight change: -2.4 kg (-5 lb 4.7 oz) Last BM Date: 11/17/11  Intake/Output from previous day: 01/02 0701 - 01/03 0700 In: 2846 [P.O.:240; I.V.:2035; Blood:571] Out: 510 [Urine:500; Stool:10] Total I/O In: 662.5 [I.V.:650; Blood:12.5] Out: -    Physical Exam: General: Comfortable, alert, communicative, fully oriented, not short of breath at rest.  HEENT:  Moderate clinical pallor, no jaundice, no conjunctival injection or discharge. Hydration status is fair. NECK:  Supple, JVP not seen, no carotid bruits, no palpable lymphadenopathy, no palpable goiter. CHEST:  Clinically clear to auscultation, no wheezes, no crackles. HEART:  Sounds 1 and 2 heard, normal, regular, no murmurs. ABDOMEN:  Obese, soft, non-tender, no palpable organomegaly, no palpable masses, normal bowel sounds. GENITALIA:  Not examined. LOWER EXTREMITIES:  No pitting edema, palpable peripheral pulses. MUSCULOSKELETAL SYSTEM:  Unremarkable. CENTRAL NERVOUS SYSTEM:  No focal neurologic deficit on gross examination.  Lab Results:  [facility] 11/17/11 0450 11/16/11 1925  WBC 11.0* 14.4*  HGB 7.6* 9.3*  HCT 22.4* 26.9*  PLT 154 186    Basename 11/17/11 0450 11/16/11 0524  NA 140 139  K 3.7 3.8  CL 110 109  CO2 21 24  GLUCOSE 184* 191*  BUN 10 15  CREATININE 0.94 1.00  CALCIUM 7.5* 7.9*   No results found for this or any previous visit (from the past 240 hour(s)).   Studies/Results: No results found.  Medications: Scheduled Meds:    . amLODipine  5 mg Oral Daily  . glimepiride  4 mg Oral QAC breakfast    . insulin aspart  0-15 Units Subcutaneous 6 X Daily  . insulin glargine  10 Units Subcutaneous QHS  . mometasone-formoterol  1 puff Inhalation BID  . prednisoLONE  10 mg Oral Daily  . ramipril  10 mg Oral Daily  . sodium chloride  1,000 mL Intravenous Once   Continuous Infusions:    . sodium chloride 100 mL/hr at 11/17/11 0513   PRN Meds:.acetaminophen, acetaminophen, albuterol, hyoscyamine, ipratropium, oxyCODONE, zolpidem, DISCONTD: fentaNYL, DISCONTD: midazolam  Assessment/Plan:  1. BRBPR (bright red blood per rectum)/painless hematochezia: This is secondary to recurrent diverticular bleed. Patient had a tagged RBC scan which showed no grossly positive active GI bleed, equivocal activity is present near the liver. Patient is status post transfusion of one unit of PRBC on 11/15/2011, 2 units on 11/16/11, and 1 unit on 11/17/11. Following CBC. 2. History of pan-diverticulosis and recurrent bleeds: Most recent bleed was November of 2011. Patient is status post colonoscopy on 11/16/10, by Dr Rhea Belton, which showed blood in ascending colon to rectum (no blood noted in cecum), moderate diverticulosis ascending colon/hepatic flexure and sigmoid colon. Transverse colon and descending colon relatively spared of diverticulosis, sessile polyp in the sigmoid colon. Polypectomy was not attempted due to recent significant bleeding. GI recommendations have been noted, and we shall manage accordingly.  3. Recent shortness of breath with wheezing: Presented on steroid taper.No history of obstructive airways disease, and no wheeze or respiratory distress was noted during this hospitalization. Prednisone was discontinued on 11/16/11. Patient may benefit from outpatient  pulmonary function testing, to rule out reactive airway disease.  4. Type 2 diabetes uncontrolled with complications: Patient is on sliding scale insulin/Lantus. Oral agents currently on hold. Steroids may be contributory. Control is adequate. 5.  Hypertension. Controlled on home medications.  6. Hyponatremia and hyperkalemia.   Comment: Disposition per GI.    LOS: 3 days   Zuriyah Shatz,[name] 11/17/2011, 6:12 PM

## 2011-11-17 NOTE — Progress Notes (Signed)
Notified M. Edwards,NP of Hgb of 7.6.No orders received at this time.

## 2011-11-17 NOTE — Procedures (Signed)
Patient currently was to hold on PICC placement. She states " I want to speak with Dr. Rhea Belton before proceeding" Risk and benefits of PICC were discussed and patient verbalizes understanding. Will follow up as advised.

## 2011-11-17 NOTE — Progress Notes (Signed)
Patient seen and I agree with the above documentation, including the assessment and plan. Proceed with colon given ongoing, intermittent LGI bleeding

## 2011-11-18 DIAGNOSIS — K922 Gastrointestinal hemorrhage, unspecified: Secondary | ICD-10-CM

## 2011-11-18 DIAGNOSIS — K5732 Diverticulitis of large intestine without perforation or abscess without bleeding: Secondary | ICD-10-CM

## 2011-11-18 LAB — CBC
HCT: 24.9 % — ABNORMAL LOW (ref 36.0–46.0)
MCH: 27.9 pg (ref 26.0–34.0)
MCHC: 33.3 g/dL (ref 30.0–36.0)
MCV: 83.6 fL (ref 78.0–100.0)
Platelets: 175 10*3/uL (ref 150–400)
RDW: 16.4 % — ABNORMAL HIGH (ref 11.5–15.5)
WBC: 9.5 10*3/uL (ref 4.0–10.5)

## 2011-11-18 LAB — TYPE AND SCREEN
Unit division: 0
Unit division: 0

## 2011-11-18 LAB — BASIC METABOLIC PANEL
BUN: 5 mg/dL — ABNORMAL LOW (ref 6–23)
Calcium: 7.7 mg/dL — ABNORMAL LOW (ref 8.4–10.5)
Creatinine, Ser: 0.83 mg/dL (ref 0.50–1.10)
GFR calc Af Amer: >90 mL/min (ref >90)
GFR calc non Af Amer: 79 mL/min — ABNORMAL LOW (ref >90)
Glucose, Bld: 128 mg/dL — ABNORMAL HIGH (ref 70–99)
Potassium: 3.6 mEq/L (ref 3.5–5.1)

## 2011-11-18 LAB — GLUCOSE, CAPILLARY
Glucose-Capillary: 219 mg/dL — ABNORMAL HIGH (ref 70–99)
Glucose-Capillary: 234 mg/dL — ABNORMAL HIGH (ref 70–99)

## 2011-11-18 LAB — PREPARE RBC (CROSSMATCH)

## 2011-11-18 NOTE — Progress Notes (Signed)
Subjective: No further hematochezia noted, per patient.  Objective: Vital signs in last 24 hours: Temp:  [98.3 F (36.8 C)-98.5 F (36.9 C)] 98.3 F (36.8 C) (01/04 1400) Pulse Rate:  [84-90] 90  (01/04 1400) Resp:  [18-20] 20  (01/04 1400) BP: (124-143)/(75-91) 124/80 mmHg (01/04 1400) SpO2:  [100 %] 100 % (01/04 1400) Weight change:  Last BM Date: 11/17/11  Intake/Output from previous day: 01/03 0701 - 01/04 0700 In: 3700.8 [P.O.:560; I.V.:3128.3; Blood:12.5] Out: 1151 [Urine:1150; Stool:1] Total I/O In: 1120 [P.O.:240; I.V.:880] Out: 600 [Urine:600]   Physical Exam: General: Comfortable, alert, communicative, fully oriented, not short of breath at rest.  HEENT:  Moderate clinical pallor, no jaundice, no conjunctival injection or discharge. Hydration status is fair. NECK:  Supple, JVP not seen, no carotid bruits, no palpable lymphadenopathy, no palpable goiter. CHEST:  Clinically clear to auscultation, no wheezes, no crackles. HEART:  Sounds 1 and 2 heard, normal, regular, no murmurs. ABDOMEN:  Obese, soft, non-tender, no palpable organomegaly, no palpable masses, normal bowel sounds. GENITALIA:  Not examined. LOWER EXTREMITIES:  No pitting edema, palpable peripheral pulses. MUSCULOSKELETAL SYSTEM:  Unremarkable. CENTRAL NERVOUS SYSTEM:  No focal neurologic deficit on gross examination.  Lab Results:  Basename 11/18/11 1510 11/18/11 0351 11/17/11 1725  WBC -- 9.5 11.8*  HGB 7.8* 8.3* --  HCT 23.3* 24.9* --  PLT -- 175 174    Basename 11/18/11 0351 11/17/11 0450  NA 138 140  K 3.6 3.7  CL 109 110  CO2 23 21  GLUCOSE 128* 184*  BUN 5* 10  CREATININE 0.83 0.94  CALCIUM 7.7* 7.5*   No results found for this or any previous visit (from the past 240 hour(s)).   Studies/Results: No results found.  Medications: Scheduled Meds:    . amLODipine  5 mg Oral Daily  . glimepiride  4 mg Oral QAC breakfast  . insulin aspart  0-15 Units Subcutaneous 6 X Daily  .  insulin glargine  10 Units Subcutaneous QHS  . mometasone-formoterol  1 puff Inhalation BID  . prednisoLONE  10 mg Oral Daily  . ramipril  10 mg Oral Daily  . sodium chloride  1,000 mL Intravenous Once   Continuous Infusions:    . sodium chloride 100 mL/hr at 11/18/11 1310   PRN Meds:.acetaminophen, acetaminophen, albuterol, hyoscyamine, ipratropium, oxyCODONE, zolpidem  Assessment/Plan:  1. BRBPR (bright red blood per rectum)/painless hematochezia: This is secondary to recurrent diverticular bleed. Patient had a tagged RBC scan which showed no grossly positive active GI bleed, equivocal activity is present near the liver. Patient is status post transfusion of one unit of PRBC on 11/15/2011, 2 units on 11/16/11, and 1 unit on 11/17/11. Following CBC. Hb is down again today, although no overt bleeding observed. This may be due to equilibration. Will transfuse another 2 units PRBC, overnight (Now total of 6 units). 2. History of pan-diverticulosis and recurrent bleeds: Most recent bleed was November of 2011. Patient is status post colonoscopy on 11/16/10, by Dr Rhea Belton, which showed blood in ascending colon to rectum (no blood noted in cecum), moderate diverticulosis ascending colon/hepatic flexure and sigmoid colon. Transverse colon and descending colon relatively spared of diverticulosis, sessile polyp in the sigmoid colon. Polypectomy was not attempted due to recent significant bleeding. GI recommendations have been noted, and we shall manage accordingly. Possible bowel resection has been discussed with patient, by the surgical team. Patient is not enthusiastic about this option, at the present time. 3. Recent shortness of breath with wheezing: Presented  on steroid taper.No history of obstructive airways disease, and no wheeze or respiratory distress was noted during this hospitalization. Prednisone was discontinued on 11/16/11. Patient may benefit from outpatient pulmonary function testing, to rule out  reactive airway disease.  4. Type 2 diabetes uncontrolled with complications: Patient is on sliding scale insulin/Lantus. Oral agents currently on hold. Steroids may be contributory. Control is adequate this time.. 5. Hypertension. Controlled on home medications.  6. Hyponatremia and hyperkalemia: resolved.  Comment: Disposition per GI.    LOS: 4 days   [place]ikay Roads,[name] 11/18/2011, 6:28 PM

## 2011-11-18 NOTE — Consult Note (Signed)
Reason for Consult:Recurrent lower GI bleed Referring Physician: Mike Gip [facility]  [name] is an 56 y.o. female.  HPI: Pt. With known pan diverticulosis, and 2 major GI bleeds in 2010. A bleed in Nov. 2012, and now back with her most recent bleed on 11/14/11.  She has received 4 UPRBC'S., Bleeding scan 12/31/ 12 showed activity in the RUQ near the liver.  Colonoscopy 11/17/11 showed blood in ascending colon to rectum. No blood in cecum.  Moderated diverticulosis, in ascending colon at the hepatic flexure, and sigmoid colon.  Transverse and descending colon were relatively spared.  There was a sessile polyp in the sigmoid colon no bx, due to the bleeding.  She did undergo Arteriogram and emoblization of the bleeding site  Past Medical History  Diagnosis Date  . Diverticulosis of colon with hemorrhage 2010, 09/2011  . Hypertension   . Adenomatous colon polyp   . Diabetes mellitus   . Renal cell carcinoma 2004  . Blood transfusion   . IBS (irritable bowel syndrome)   . Hx: UTI (urinary tract infection)   Possible IBS Gastroparesis based on EGd 11/2008; pt denies sx BMI 45 She did not require chemotherapy or radiation therapy after nephrectomy 2004. Past Surgical History  Procedure Date  . Nephrectomy     left  . Tubal ligation   . Knee arthroscopy     x2 left  . Hand surgery     x5 right  . Abdominal hysterectomy   . Cholecystectomy   . Upper gastrointestinal endoscopy 12/12/2008    gastroporesis  . Flexible sigmoidoscopy 12/12/2008    diverticulosis  . Colonoscopy 12/19/2008    diverticulosis    Family History  Problem Relation Age of Onset  . Colon polyps Mother   . Diabetes Maternal Grandmother   . Diabetes Father   . Colon cancer Neg Hx     Social History:  reports that she has never smoked. She has never used smokeless tobacco. She reports that she does not drink alcohol or use illicit drugs.  Allergies: No Known Allergies  Medications:  Prior to  Admission:  Prescriptions prior to admission  Medication Sig Dispense Refill  . amLODipine (NORVASC) 5 MG tablet Take 5 mg by mouth daily.        Marland Kitchen glimepiride (AMARYL) 4 MG tablet Take 1 tablet (4 mg total) by mouth daily before breakfast.  30 tablet  0  . hyoscyamine (LEVBID) 0.375 MG 12 hr tablet Take 0.375 mg by mouth as needed.       . mometasone-formoterol (DULERA) 100-5 MCG/ACT AERO Inhale 1 puff into the lungs 2 (two) times daily. Until flu/cold symptoms are over.      . predniSONE (DELTASONE) 20 MG tablet Take 20 mg by mouth daily. 7 day course       . ramipril (ALTACE) 10 MG tablet Take 10 mg by mouth daily.       . sitaGLIPtan-metformin (JANUMET) 50-500 MG per tablet Take 1 tablet by mouth 2 (two) times daily with a meal.  60 tablet  0   Scheduled:   . amLODipine  5 mg Oral Daily  . glimepiride  4 mg Oral QAC breakfast  . insulin aspart  0-15 Units Subcutaneous 6 X Daily  . insulin glargine  10 Units Subcutaneous QHS  . mometasone-formoterol  1 puff Inhalation BID  . prednisoLONE  10 mg Oral Daily  . ramipril  10 mg Oral Daily  . sodium chloride  1,000 mL Intravenous Once  Continuous:   . sodium chloride 100 mL/hr at 11/18/11 1310   ZOX:[id number], acetaminophen, albuterol, hyoscyamine, ipratropium, oxyCODONE, zolpidem  Results for orders placed during the hospital encounter of 11/14/11 (from the past 48 hour(s))  GLUCOSE, CAPILLARY     Status: Abnormal   Collection Time   11/16/11  4:09 PM      Component Value Range Comment   Glucose-Capillary 181 (*) 70 - 99 (mg/dL)    Comment 1 Notify RN      Comment 2 Documented in Chart     CBC     Status: Abnormal   Collection Time   11/16/11  7:25 PM      Component Value Range Comment   WBC 14.4 (*) 4.0 - 10.5 (K/uL)    RBC 3.30 (*) 3.87 - 5.11 (MIL/uL)    Hemoglobin 9.3 (*) 12.0 - 15.0 (g/dL)    HCT 14.7 (*) 82.9 - 46.0 (%)    MCV 81.5  78.0 - 100.0 (fL)    MCH 28.2  26.0 - 34.0 (pg)    MCHC 34.6  30.0 - 36.0 (g/dL)      RDW 56.2 (*) 13.0 - 15.5 (%)    Platelets 186  150 - 400 (K/uL)   GLUCOSE, CAPILLARY     Status: Abnormal   Collection Time   11/16/11  7:57 PM      Component Value Range Comment   Glucose-Capillary 161 (*) 70 - 99 (mg/dL)    Comment 1 Notify RN      Comment 2 Documented in Chart     GLUCOSE, CAPILLARY     Status: Abnormal   Collection Time   11/17/11 12:09 AM      Component Value Range Comment   Glucose-Capillary 150 (*) 70 - 99 (mg/dL)    Comment 1 Notify RN     CBC     Status: Abnormal   Collection Time   11/17/11  4:50 AM      Component Value Range Comment   WBC 11.0 (*) 4.0 - 10.5 (K/uL)    RBC 2.71 (*) 3.87 - 5.11 (MIL/uL)    Hemoglobin 7.6 (*) 12.0 - 15.0 (g/dL)    HCT 86.5 (*) 78.4 - 46.0 (%)    MCV 82.7  78.0 - 100.0 (fL)    MCH 27.7  26.0 - 34.0 (pg)    MCHC 33.5  30.0 - 36.0 (g/dL)    RDW 69.6 (*) 29.5 - 15.5 (%)    Platelets 154  150 - 400 (K/uL) REPEATED TO VERIFY  COMPREHENSIVE METABOLIC PANEL     Status: Abnormal   Collection Time   11/17/11  4:50 AM      Component Value Range Comment   Sodium 140  135 - 145 (mEq/L)    Potassium 3.7  3.5 - 5.1 (mEq/L)    Chloride 110  96 - 112 (mEq/L)    CO2 21  19 - 32 (mEq/L)    Glucose, Bld 184 (*) 70 - 99 (mg/dL)    BUN 10  6 - 23 (mg/dL)    Creatinine, Ser 2.84  0.50 - 1.10 (mg/dL)    Calcium 7.5 (*) 8.4 - 10.5 (mg/dL)    Total Protein 4.6 (*) 6.0 - 8.3 (g/dL)    Albumin 2.1 (*) 3.5 - 5.2 (g/dL)    AST 16  0 - 37 (U/L)    ALT 19  0 - 35 (U/L)    Alkaline Phosphatase 56  39 - 117 (U/L)    Total  Bilirubin 0.2 (*) 0.3 - 1.2 (mg/dL)    GFR calc non Af Amer 68 (*) >90 (mL/min)    GFR calc Af Amer 78 (*) >90 (mL/min)   GLUCOSE, CAPILLARY     Status: Abnormal   Collection Time   11/17/11  5:42 AM      Component Value Range Comment   Glucose-Capillary 191 (*) 70 - 99 (mg/dL)    Comment 1 Notify RN     GLUCOSE, CAPILLARY     Status: Abnormal   Collection Time   11/17/11  9:12 AM      Component Value Range Comment    Glucose-Capillary 191 (*) 70 - 99 (mg/dL)   PREPARE RBC (CROSSMATCH)     Status: Normal   Collection Time   11/17/11  9:30 AM      Component Value Range Comment   Order Confirmation ORDER PROCESSED BY BLOOD BANK     GLUCOSE, CAPILLARY     Status: Abnormal   Collection Time   11/17/11 12:21 PM      Component Value Range Comment   Glucose-Capillary 156 (*) 70 - 99 (mg/dL)   CBC     Status: Abnormal   Collection Time   11/17/11  5:25 PM      Component Value Range Comment   WBC 11.8 (*) 4.0 - 10.5 (K/uL)    RBC 3.06 (*) 3.87 - 5.11 (MIL/uL)    Hemoglobin 8.6 (*) 12.0 - 15.0 (g/dL)    HCT 40.9 (*) 81.1 - 46.0 (%)    MCV 82.4  78.0 - 100.0 (fL)    MCH 28.1  26.0 - 34.0 (pg)    MCHC 34.1  30.0 - 36.0 (g/dL)    RDW 91.4 (*) 78.2 - 15.5 (%)    Platelets 174  150 - 400 (K/uL)   GLUCOSE, CAPILLARY     Status: Abnormal   Collection Time   11/17/11  5:47 PM      Component Value Range Comment   Glucose-Capillary 156 (*) 70 - 99 (mg/dL)   GLUCOSE, CAPILLARY     Status: Abnormal   Collection Time   11/17/11  9:48 PM      Component Value Range Comment   Glucose-Capillary 115 (*) 70 - 99 (mg/dL)   GLUCOSE, CAPILLARY     Status: Abnormal   Collection Time   11/18/11  2:01 AM      Component Value Range Comment   Glucose-Capillary 111 (*) 70 - 99 (mg/dL)   CBC     Status: Abnormal   Collection Time   11/18/11  3:51 AM      Component Value Range Comment   WBC 9.5  4.0 - 10.5 (K/uL)    RBC 2.98 (*) 3.87 - 5.11 (MIL/uL)    Hemoglobin 8.3 (*) 12.0 - 15.0 (g/dL)    HCT 95.6 (*) 21.3 - 46.0 (%)    MCV 83.6  78.0 - 100.0 (fL)    MCH 27.9  26.0 - 34.0 (pg)    MCHC 33.3  30.0 - 36.0 (g/dL)    RDW 08.6 (*) 57.8 - 15.5 (%)    Platelets 175  150 - 400 (K/uL)   BASIC METABOLIC PANEL     Status: Abnormal   Collection Time   11/18/11  3:51 AM      Component Value Range Comment   Sodium 138  135 - 145 (mEq/L)    Potassium 3.6  3.5 - 5.1 (mEq/L)    Chloride 109  96 - 112 (  mEq/L)    CO2 23  19 - 32 (mEq/L)     Glucose, Bld 128 (*) 70 - 99 (mg/dL)    BUN 5 (*) 6 - 23 (mg/dL)    Creatinine, Ser 0.98  0.50 - 1.10 (mg/dL)    Calcium 7.7 (*) 8.4 - 10.5 (mg/dL)    GFR calc non Af Amer 79 (*) >90 (mL/min)    GFR calc Af Amer >90  >90 (mL/min)   GLUCOSE, CAPILLARY     Status: Abnormal   Collection Time   11/18/11  5:51 AM      Component Value Range Comment   Glucose-Capillary 127 (*) 70 - 99 (mg/dL)   GLUCOSE, CAPILLARY     Status: Abnormal   Collection Time   11/18/11  9:32 AM      Component Value Range Comment   Glucose-Capillary 219 (*) 70 - 99 (mg/dL)    Comment 1 Notify RN      Comment 2 Documented in Chart     GLUCOSE, CAPILLARY     Status: Abnormal   Collection Time   11/18/11 12:02 PM      Component Value Range Comment   Glucose-Capillary 175 (*) 70 - 99 (mg/dL)    Comment 1 Notify RN      Comment 2 Documented in Chart       No results found.  Review of Systems  Constitutional: Negative.        She had what sounds like a presyncopal episode in ER, with dizziness , vision changes.  HENT: Negative.   Eyes: Negative.   Cardiovascular: Negative.   Gastrointestinal: Positive for heartburn (occasional ) and blood in stool. Negative for nausea, vomiting, abdominal pain, diarrhea and constipation.  Genitourinary: Negative.   Musculoskeletal: Negative.   Skin: Negative.   Neurological: Negative.   Endo/Heme/Allergies: Negative.   Psychiatric/Behavioral: Negative.    Blood pressure 124/80, pulse 90, temperature 98.3 F (36.8 C), temperature source Oral, resp. rate 20, height 5\' 1"  (1.549 m), weight 110 kg (242 lb 8.1 oz), last menstrual period 11/16/2002, SpO2 100.00%. Physical Exam  Constitutional: She is oriented to person, place, and time. She appears well-developed and well-nourished. No distress.       Obese AAF NAD  HENT:  Head: Normocephalic and atraumatic.  Nose: Nose normal.  Eyes: Conjunctivae and EOM are normal. Pupils are equal, round, and reactive to light.  Neck: Normal  range of motion. Neck supple. No JVD present. No tracheal deviation present. No thyromegaly present.  Cardiovascular: Normal rate, regular rhythm, normal heart sounds and intact distal pulses.  Exam reveals no friction rub.   No murmur heard. Respiratory: Effort normal and breath sounds normal. No respiratory distress. She has no wheezes. She has no rales. She exhibits no tenderness.  GI: Soft. Bowel sounds are normal. She exhibits no distension and no mass. There is no tenderness. There is no rebound and no guarding.       Large midline incision.  Musculoskeletal: She exhibits no edema and no tenderness.  Lymphadenopathy:    She has no cervical adenopathy.  Neurological: She is alert and oriented to person, place, and time. She has normal reflexes. No cranial nerve deficit.  Skin: Skin is warm and dry.  Psychiatric: She has a normal mood and affect. Her behavior is normal. Judgment normal.    Assessment/Plan: Recurrent lower GI bleed most likely from Right Colon near the hepatic flexure Patient Active Problem List  Diagnoses  . DM (diabetes mellitus), type 2  with complications  . OBESITY, MORBID  . HYPERTENSION  . PERSONAL HX COLONIC POLYPS  . Diverticulosis of colon with hemorrhage - recurrent  . S/p nephrectomy  . Hypertension  . Renal cell carcinoma  . IBS (irritable bowel syndrome)  . BRBPR (bright red blood per rectum)  . Shortness of breath  . Anemia due to blood loss, acute  BMI 45.   Plan:  Dr. Biagio Quint talked with her for 20 minutes and reviewed options.  We could take out Right colon and that may resolve issue.  He cannot guarantee that.  The next option is complete colectomy and all the issues that attend total colectomy.  The next option is to wait.   He will let the patient think and discuss with family.  She can follow up as an outpatient, if she remains stable and is set for discharge. Will Marlyne Beards PAC/Dr. Mallie Mussel 11/18/2011, 3:15 PM    I had a  long discussion with this patient about the options as well as the risks and benefits of each.  I offered the following options: 1. Continued nonoperative management with the possibility of rebleeding in the future. 2. Right hemicolectomy for possible treatment.  All objective evidence from this episode and those of the past have indicated that this is from a source in the right colon/hepatic flexure area.  However, she has pandiverticulosis and she still has the possibility of complications from her left sided diverticulosis.  3.  Subtotal colectomy.  This would be the most morbid procedure and higher risks of complications with frequent bowel movements, but would be the highest probability of rebleeding. Fortunately, she appears to have stopped her acute bleeding and has a choice.  She was undecided about what she wanted to do and wanted some time to think about her decision.  Regardless, she does not appear to have continued bleeding and her diet has been advanced.  If she would like to proceed with surgery, then she can follow up with me in the office to schedule her procedure.  She can call [phone] to schedule an appointment.

## 2011-11-18 NOTE — Progress Notes (Signed)
Patient ID: [name], female   DOB: 04-Apr-1956, 56 y.o.   MRN: [id number] [facility] Progress Note  Subjective: Feels weak,but no further bleeding since colonoscopy. Hungry! Remains sob with ambulation.  Objective:  Vital signs in last 24 hours: Temp:  [97.8 F (36.6 C)-98.5 F (36.9 C)] 98.5 F (36.9 C) (01/04 0540) Pulse Rate:  [84-106] 84  (01/04 0540) Resp:  [13-95] 18  (01/04 0540) BP: (125-158)/(55-92) 135/75 mmHg (01/04 0540) SpO2:  [98 %-100 %] 100 % (01/04 0540) Last BM Date: 11/17/11 General:   Alert,  Well-developed, obese   in NAD Heart:  Regular rate and rhythm; no murmurs Pulm;clear Abdomen:  Soft, nontender and nondistended. Normal bowel sounds, without guarding,  Neurologic:  Alert and  oriented x4;  grossly normal neurologically. Psych:  Alert and cooperative. Normal mood and affect.  Intake/Output from previous day: 01/03 0701 - 01/04 0700 In: 3700.8 [P.O.:560; I.V.:3128.3; Blood:12.5] Out: 1151 [Urine:1150; Stool:1] Intake/Output this shift:    Lab Results:  Basename 11/18/11 0351 11/17/11 1725 11/17/11 0450  WBC 9.5 11.8* 11.0*  HGB 8.3* 8.6* 7.6*  HCT 24.9* 25.2* 22.4*  PLT 175 174 154   BMET  Basename 11/18/11 0351 11/17/11 0450 11/16/11 0524  NA 138 140 139  K 3.6 3.7 3.8  CL 109 110 109  CO2 23 21 24   GLUCOSE 128* 184* 191*  BUN 5* 10 15  CREATININE 0.83 0.94 1.00  CALCIUM 7.7* 7.5* 7.9*   LFT  Basename 11/17/11 0450  PROT 4.6*  ALBUMIN 2.1*  AST 16  ALT 19  ALKPHOS 56  BILITOT 0.2*  BILIDIR --  IBILI --       Assessment / Plan: #1 56 yo female with recurrent significant diverticular bleeding-hopefully has stopped . Will advance diet today Ask surgery to see to discuss elective resection- she has pandiverticulosis however majority concentrated in right colon/hepatic flexure. She also had previous bleed felt to be right sided at that time on arteriogram. If pt rebleeds this admit will proceed  urgently to bleeding scan and arteriogram. #2 Anemia-secondary to blood loss-hold on further tranfusion today unless rebleeding. Principal Problem:  *BRBPR (bright red blood per rectum) Active Problems:  DM (diabetes mellitus), type 2 with complications  HYPERTENSION  Diverticulosis of colon with hemorrhage - recurrent  IBS (irritable bowel syndrome)  Shortness of breath  Anemia due to blood loss, acute     LOS: 4 days   Clotine Heiner  11/18/2011, 8:50 AM

## 2011-11-18 NOTE — Progress Notes (Signed)
Patient seen and I agree with the above documentation, including the assessment and plan.  

## 2011-11-18 NOTE — Progress Notes (Signed)
Celso Sickle notified of patients Hgb of 8.3. No new orders received. Will continue to monitor patient.C.Deyci Gesell,RN

## 2011-11-19 LAB — BASIC METABOLIC PANEL
CO2: 26 mEq/L (ref 19–32)
Calcium: 8.6 mg/dL (ref 8.4–10.5)
Chloride: 107 mEq/L (ref 96–112)
Creatinine, Ser: 0.83 mg/dL (ref 0.50–1.10)
GFR calc Af Amer: >90 mL/min (ref >90)
Sodium: 138 mEq/L (ref 135–145)

## 2011-11-19 LAB — HEMOGLOBIN AND HEMATOCRIT, BLOOD: Hemoglobin: 11 g/dL — ABNORMAL LOW (ref 12.0–15.0)

## 2011-11-19 LAB — GLUCOSE, CAPILLARY
Glucose-Capillary: 189 mg/dL — ABNORMAL HIGH (ref 70–99)
Glucose-Capillary: 175 mg/dL — ABNORMAL HIGH (ref 70–99)

## 2011-11-19 MED ORDER — SODIUM CHLORIDE 0.9 % IJ SOLN
10.0000 mL | Freq: Two times a day (BID) | INTRAMUSCULAR | Status: DC
  Administered 2011-11-19 (×2): 10 mL via INTRAVENOUS
  Administered 2011-11-20: 6 mL via INTRAVENOUS

## 2011-11-19 MED ORDER — METFORMIN HCL 500 MG PO TABS
500.0000 mg | ORAL_TABLET | Freq: Two times a day (BID) | ORAL | Status: DC
  Administered 2011-11-19: 500 mg via ORAL
  Filled 2011-11-19 (×3): qty 1

## 2011-11-19 MED ORDER — LINAGLIPTIN 5 MG PO TABS
5.0000 mg | ORAL_TABLET | Freq: Two times a day (BID) | ORAL | Status: DC
  Administered 2011-11-19: 5 mg via ORAL
  Filled 2011-11-19 (×3): qty 1

## 2011-11-19 MED ORDER — SITAGLIPTIN PHOS-METFORMIN HCL 50-500 MG PO TABS: 1.0000 | ORAL_TABLET | Freq: Two times a day (BID) | ORAL | Status: DC

## 2011-11-19 MED ORDER — INSULIN ASPART 100 UNIT/ML ~~LOC~~ SOLN
0.0000 [IU] | Freq: Three times a day (TID) | SUBCUTANEOUS | Status: DC
  Administered 2011-11-19: 3 [IU] via SUBCUTANEOUS
  Administered 2011-11-19: 5 [IU] via SUBCUTANEOUS
  Administered 2011-11-19 – 2011-11-20 (×2): 3 [IU] via SUBCUTANEOUS
  Administered 2011-11-20: 2 [IU] via SUBCUTANEOUS

## 2011-11-19 NOTE — Progress Notes (Signed)
General Surgery Note  LOS: 5 days  PCP - Merri Brunette, [facility].  Assessment/Plan:   1.  BRBPR (bright red blood per rectum) - thought to be diverticular as source. Has been stable.  Plan it observe for 24 more hours, then discharge.  Agree.    2.  DM (diabetes mellitus), type 2 with complications (06/23/2009)  3.  HYPERTENSION (06/23/2009)     4.  Diverticulosis of colon with hemorrhage - recurrent. She has pan colonic diverticulosis.  Source of bleeding has been suggested in the right colon, but if she comes to surgery, it will be difficult no to do a subtotal colectomy.    5.  IBS (irritable bowel syndrome)  6.  Shortness of breath.  7.  Anemia due to blood loss, acute - Hgb 11 this AM   Subjective:  Doing better  Objective:   Filed Vitals:   11/19/11 0500  BP: 163/87  Pulse: 91  Temp: 98.7 F (37.1 C)  Resp: 18     Intake/Output from previous day:  01/04 0701 - 01/05 0700 In: 3392.8 [P.O.:240; I.V.:2607.8; Blood:545] Out: 4775 [Urine:4775]  Intake/Output this shift:  Total I/O In: 480 [P.O.:480] Out: 800 [Urine:800]   Physical Exam:   General: AA F who is alert and oriented.    HEENT: Normal. Pupils equal. Good dentition. .   Lungs: Clear   Abdomen: Soft   Neurologic:  Grossly intact to motor and sensory function.   Psychiatric: Has normal mood and affect. Behavior is normal   Lab Results:    Basename 11/19/11 0910 11/18/11 1510 11/18/11 0351 11/17/11 1725  WBC -- -- 9.5 11.8*  HGB 11.0* 7.8* -- --  HCT 32.7* 23.3* -- --  PLT -- -- 175 174    BMET   Basename 11/19/11 0725 11/18/11 0351  NA 138 138  K 3.7 3.6  CL 107 109  CO2 26 23  GLUCOSE 213* 128*  BUN 5* 5*  CREATININE 0.83 0.83  CALCIUM 8.6 7.7*    PT/INR  No results found for this basename: LABPROT:2,INR:2 in the last 72 hours  ABG  No results found for this basename: PHART:2,PCO2:2,PO2:2,HCO3:2 in the last 72 hours   Studies/Results:  No results  found.   Anti-infectives:   Anti-infectives    None       Ovidio Kin, MD, FACS Pager: [phone],   [facility] Surgery Office: [phone] 11/19/2011

## 2011-11-19 NOTE — Progress Notes (Addendum)
Subjective: No further hematochezia noted.  Objective: Vital signs in last 24 hours: Temp:  [97.9 F (36.6 C)-98.7 F (37.1 C)] 98.1 F (36.7 C) (01/05 1350) Pulse Rate:  [78-100] 84  (01/05 1350) Resp:  [18-20] 18  (01/05 1350) BP: (108-163)/(66-94) 108/76 mmHg (01/05 1350) SpO2:  [98 %-100 %] 99 % (01/05 1350) Weight:  [112.2 kg (247 lb 5.7 oz)] 247 lb 5.7 oz (112.2 kg) (01/05 1610) Weight change:  Last BM Date: 11/18/11  Intake/Output from previous day: 01/04 0701 - 01/05 0700 In: 3392.8 [P.O.:240; I.V.:2607.8; Blood:545] Out: 4775 [Urine:4775] Total I/O In: 960 [P.O.:960] Out: 1400 [Urine:1400]   Physical Exam: General: Comfortable, alert, communicative, fully oriented, not short of breath at rest.  HEENT:  Mild clinical pallor, no jaundice, no conjunctival injection or discharge. Hydration status is fair. NECK:  Supple, JVP not seen, no carotid bruits, no palpable lymphadenopathy, no palpable goiter. CHEST:  Clinically clear to auscultation, no wheezes, no crackles. HEART:  Sounds 1 and 2 heard, normal, regular, no murmurs. ABDOMEN:  Obese, soft, non-tender, no palpable organomegaly, no palpable masses, normal bowel sounds. GENITALIA:  Not examined. LOWER EXTREMITIES:  No pitting edema, palpable peripheral pulses. MUSCULOSKELETAL SYSTEM:  Unremarkable. CENTRAL NERVOUS SYSTEM:  No focal neurologic deficit on gross examination.  Lab Results:  Basename 11/19/11 0910 11/18/11 1510 11/18/11 0351 11/17/11 1725  WBC -- -- 9.5 11.8*  HGB 11.0* 7.8* -- --  HCT 32.7* 23.3* -- --  PLT -- -- 175 174    Basename 11/19/11 0725 11/18/11 0351  NA 138 138  K 3.7 3.6  CL 107 109  CO2 26 23  GLUCOSE 213* 128*  BUN 5* 5*  CREATININE 0.83 0.83  CALCIUM 8.6 7.7*   No results found for this or any previous visit (from the past 240 hour(s)).   Studies/Results: No results found.  Medications: Scheduled Meds:    . amLODipine  5 mg Oral Daily  . glimepiride  4 mg Oral QAC  breakfast  . insulin aspart  0-15 Units Subcutaneous TID WC & HS  . insulin glargine  10 Units Subcutaneous QHS  . mometasone-formoterol  1 puff Inhalation BID  . prednisoLONE  10 mg Oral Daily  . ramipril  10 mg Oral Daily  . DISCONTD: insulin aspart  0-15 Units Subcutaneous 6 X Daily  . DISCONTD: sodium chloride  1,000 mL Intravenous Once   Continuous Infusions:    . sodium chloride 20 mL/hr at 11/19/11 0855   PRN Meds:.acetaminophen, acetaminophen, albuterol, hyoscyamine, ipratropium, oxyCODONE, zolpidem  Assessment/Plan:  1. BRBPR (bright red blood per rectum)/painless hematochezia: This is secondary to recurrent diverticular bleed. Patient had a tagged RBC scan which showed no grossly positive active GI bleed, equivocal activity is present near the liver. Patient is status post transfusion of one unit of PRBC on 11/15/2011, 2 units on 11/16/11, and 1 unit on 11/17/11. Following CBC. Hb was down again on 1/54/13, although no overt bleeding was observed. Transfused another 2 units PRBC, overnight, with satisfactory bump in Hb (Now total of 6 units). 2. History of pan-diverticulosis and recurrent bleeds: Most recent bleed was November of 2011. Patient is status post colonoscopy on 11/16/10, by Dr Rhea Belton, which showed blood in ascending colon to rectum (no blood noted in cecum), moderate diverticulosis ascending colon/hepatic flexure and sigmoid colon. Transverse colon and descending colon relatively spared of diverticulosis, sessile polyp in the sigmoid colon. Polypectomy was not attempted due to recent significant bleeding. GI recommendations have been noted, and we shall  manage accordingly. Possible bowel resection has been discussed with patient, by the surgical team. Patient is not enthusiastic about this option, at the present time. Colonoscopy with polypectomy will be attempted at a later date. 3. Recent shortness of breath with wheezing: Presented on steroid taper.No history of obstructive  airways disease, and no wheeze or respiratory distress was noted during this hospitalization. Prednisone was discontinued on 11/16/11. Patient may benefit from outpatient pulmonary function testing, to rule out reactive airway disease.  4. Type 2 diabetes uncontrolled with complications: Patient is on sliding scale insulin/Lantus. Oral agents currently on hold. Steroids may be contributory. Control is adequate this time. Will restart Amaryl and Janumet. 5. Hypertension. Controlled on home medications.  6. Hyponatremia and hyperkalemia: resolved.  Comment: Disposition per GI, patient will likely be discharged on 11/20/11.    LOS: 5 days   [name],[name] 11/19/2011, 3:20 PM

## 2011-11-19 NOTE — Discharge Summary (Signed)
Physician Discharge Summary  Patient ID: [name] MRN: [id number] DOB/AGE: Mar 04, 1956 56 y.o.  Admit date: 11/14/2011 Discharge date: 11/20/2011  Primary Care Physician:  Allean Found, MD, MD   Discharge Diagnoses:    Patient Active Problem List  Diagnoses  . DM (diabetes mellitus), type 2 with complications  . OBESITY, MORBID  . HYPERTENSION  . PERSONAL HX COLONIC POLYPS  . Diverticulosis of colon with hemorrhage - recurrent  . S/p nephrectomy  . Hypertension  . Renal cell carcinoma  . IBS (irritable bowel syndrome)  . BRBPR (bright red blood per rectum)  . Shortness of breath  . Anemia due to blood loss, acute    Current Discharge Medication List    CONTINUE these medications which have NOT CHANGED   Details  amLODipine (NORVASC) 5 MG tablet Take 5 mg by mouth daily.      glimepiride (AMARYL) 4 MG tablet Take 1 tablet (4 mg total) by mouth daily before breakfast. Qty: 30 tablet, Refills: 0    hyoscyamine (LEVBID) 0.375 MG 12 hr tablet Take 0.375 mg by mouth as needed.     mometasone-formoterol (DULERA) 100-5 MCG/ACT AERO Inhale 1 puff into the lungs 2 (two) times daily. Until flu/cold symptoms are over.    ramipril (ALTACE) 10 MG tablet Take 10 mg by mouth daily.     sitaGLIPtan-metformin (JANUMET) 50-500 MG per tablet Take 1 tablet by mouth 2 (two) times daily with a meal. Qty: 60 tablet, Refills: 0      STOP taking these medications     predniSONE (DELTASONE) 20 MG tablet          Disposition and Follow-up:  Follow up with primary MD and gastroenterologist.  Consults:  GI and general surgery  Dr Erick Blinks, gastroenterologist. Dr Lodema Pilot, Surgeon.  Significant Diagnostic Studies:  Nm Gi Blood Loss  11/14/2011  *RADIOLOGY REPORT*  Clinical Data: Recurrent diverticular GI bleed.  NUCLEAR MEDICINE GASTROINTESTINAL BLEEDING STUDY  Technique:  Sequential abdominal images were obtained following intravenous administration of  Tc-100m labeled red blood cells.  Radiopharmaceutical: 23.55 mCi technetium 21m labeled red blood cells  Comparison: 12/16/2008  Findings: Imaging shows equivocal activity over the first 60 minutes near the liver.  This activity does not show significant transit and is not as active as blood pool.  This activity is also not nearly as strong as activity noted during a positive bleeding scan 2 years ago.  IMPRESSION: No grossly positive active GI bleed is identified. Equivocal activity is present near the liver.  Original Report Authenticated By: Reola Calkins, M.D.    Brief H and P: For complete details, refer to admission H and P. However,  in brief, this is a 56 y.o. African American female, with history of left nephrectomy for renal cell carcinoma, history of diverticulosis with recurrent GI bleed, diabetes, history of irritable bowel syndrome, and hypertension,  who presents repeated episodes of passage of bright red blood per rectum. As a result presented to the emergency department and while in the emergency department had 3 episodes of bright red blood per rectum. She was admitted for further evaluation, investigation and management.  Physical Exam: On 11/20/2011. General: Comfortable, alert, communicative, fully oriented, not short of breath at rest.  HEENT: Mild clinical pallor, no jaundice, no conjunctival injection or discharge. Hydration status is fair.  NECK: Supple, JVP not seen, no carotid bruits, no palpable lymphadenopathy, no palpable goiter.  CHEST: Clinically clear to auscultation, no wheezes, no crackles.  HEART: Sounds  1 and 2 heard, normal, regular, no murmurs.  ABDOMEN: Obese, soft, non-tender, no palpable organomegaly, no palpable masses, normal bowel sounds.  GENITALIA: Not examined.  LOWER EXTREMITIES: No pitting edema, palpable peripheral pulses.  MUSCULOSKELETAL SYSTEM: Unremarkable.  CENTRAL NERVOUS SYSTEM: No focal neurologic deficit on gross examination.  Hospital  Course:   1. BRBPR (bright red blood per rectum)/painless hematochezia: This was secondary to recurrent diverticular bleed. Patient had a tagged RBC scan, which showed no grossly positive active GI bleed, equivocal activity is present near the liver. Due to continued bleeding and diminution of hemoglobin, patient required transfusion of one unit of PRBC on 11/15/2011, 2 units on 11/16/11, and 1 unit on 11/17/11. Hb was down again on 11/18/11, although no overt bleeding was observed. She was transfused another 2 units PRBC, overnight, with satisfactory bump in Hb (Now total of 6 units). Hemoglobin was 10.5 on 11/20/11., and no further bleeding has been observed in the past 48 hours. 2. History of pan-diverticulosis and recurrent bleeds: Most recent bleed was in November of 2011. Patient underwent colonoscopy on 11/16/10, by Dr Rhea Belton, which showed blood in ascending colon to rectum (no blood noted in cecum), moderate diverticulosis ascending colon/hepatic flexure and sigmoid colon. Transverse colon and descending colon relatively spared of diverticulosis, sessile polyp in the sigmoid colon. Polypectomy was not attempted, due to recent significant bleeding. Possible bowel resection has been discussed with patient, by Dr Biagio Quint, surgeon. Patient is not enthusiastic about this option, at the present time. Colonoscopy with polypectomy will be attempted at a later date.  3. Recent shortness of breath with wheezing: Presented on steroid taper. She has no history of obstructive airways disease, and no wheeze or respiratory distress was noted during this hospitalization. Prednisone was discontinued on 11/16/11, without deleterious effect. Patient may benefit from outpatient pulmonary function testing, to rule out reactive airway disease. We shall defer to her primary MD. 4. Type 2 diabetes uncontrolled with complications: Patient was managed with sliding scale insulin, and oral agents were placed on hold. CBG was reasonable on  11/19/2011, and Amaryl and Janumet have been re-started. Steroids may have been contributory to hyperglycemia. 5.  Hypertension. This was controlled on home medications.   Comment: Per GI, patient was stable for discharge on 11/20/11.      Time spent on Discharge: 35 mins.  Signed: Akeem Heppler,[name] 11/20/2011, 12:28 PM

## 2011-11-19 NOTE — Progress Notes (Signed)
Patient ID: [name], female   DOB: 1956/10/11, 56 y.o.   MRN: [id number] [facility] Progress Note  Subjective: Doing well, Had 2 more units yesterday- no dizziness or SOB now. No BM"s in over 24 hours.  Objective:  Vital signs in last 24 hours: Temp:  [97.9 F (36.6 C)-98.7 F (37.1 C)] 98.7 F (37.1 C) (01/05 0500) Pulse Rate:  [78-100] 91  (01/05 0500) Resp:  [18-20] 18  (01/05 0500) BP: (118-163)/(66-94) 163/87 mmHg (01/05 0500) SpO2:  [98 %-100 %] 99 % (01/05 0500) Weight:  [112.2 kg (247 lb 5.7 oz)] 247 lb 5.7 oz (112.2 kg) (01/05 4098) Last BM Date: 11/18/11 General:   Alert,  Well-developed,    in NAD Heart:  Regular rate and rhythm; no murmurs Pulm; Abdomen:  Soft, nontender and nondistended. Normal bowel sounds, without guarding, and without rebound.   Extremities:  Without edema. Neurologic:  Alert and  oriented x4;     Intake/Output from previous day: 01/04 0701 - 01/05 0700 In: 3392.8 [P.O.:240; I.V.:2607.8; Blood:545] Out: 4775 [Urine:4775] Intake/Output this shift: Total I/O In: -  Out: 800 [Urine:800]  Lab Results:  Basename 11/18/11 1510 11/18/11 0351 11/17/11 1725 11/17/11 0450  WBC -- 9.5 11.8* 11.0*  HGB 7.8* 8.3* 8.6* --  HCT 23.3* 24.9* 25.2* --  PLT -- 175 174 154   BMET  Basename 11/19/11 0725 11/18/11 0351 11/17/11 0450  NA 138 138 140  K 3.7 3.6 3.7  CL 107 109 110  CO2 26 23 21   GLUCOSE 213* 128* 184*  BUN 5* 5* 10  CREATININE 0.83 0.83 0.94  CALCIUM 8.6 7.7* 7.5*   LFT  Basename 11/17/11 0450  PROT 4.6*  ALBUMIN 2.1*  AST 16  ALT 19  ALKPHOS 56  BILITOT 0.2*  BILIDIR --  IBILI --        Assessment / Plan: [age] yo female with recurrent diverticular bleeding, major- 6 units of blood this admit. It appears she has stopped at this point- hgb pending this am. Decrease IV fluids  Appreciate surgical consult- she will f/u with Dr Biagio Quint outpt. Will observe another 24 hours-if no further bleeding  she can be discharged tomorrow. Principal Problem:  *BRBPR (bright red blood per rectum) Active Problems:  DM (diabetes mellitus), type 2 with complications  HYPERTENSION  Diverticulosis of colon with hemorrhage - recurrent  IBS (irritable bowel syndrome)  Shortness of breath  Anemia due to blood loss, acute     LOS: 5 days   Maddilyn Campus  11/19/2011, 8:46 AM

## 2011-11-19 NOTE — Progress Notes (Signed)
Patient seen and I agree with the above documentation, including the assessment and plan. Likely d/c tomorrow am if no further bleeding Hgb up to 11

## 2011-11-20 LAB — TYPE AND SCREEN
ABO/RH(D): O POS
Antibody Screen: NEGATIVE
Unit division: 0

## 2011-11-20 LAB — BASIC METABOLIC PANEL
BUN: 7 mg/dL (ref 6–23)
Creatinine, Ser: 0.86 mg/dL (ref 0.50–1.10)
GFR calc non Af Amer: 75 mL/min — ABNORMAL LOW (ref >90)
Glucose, Bld: 180 mg/dL — ABNORMAL HIGH (ref 70–99)
Potassium: 3.4 mEq/L — ABNORMAL LOW (ref 3.5–5.1)

## 2011-11-20 LAB — GLUCOSE, CAPILLARY: Glucose-Capillary: 123 mg/dL — ABNORMAL HIGH (ref 70–99)

## 2011-11-20 LAB — HEMOGLOBIN AND HEMATOCRIT, BLOOD: HCT: 31 % — ABNORMAL LOW (ref 36.0–46.0)

## 2011-11-20 NOTE — Progress Notes (Signed)
Patient ID: [name], female   DOB: 1956-03-02, 56 y.o.   MRN: [id number] [name] Progress Note  Subjective: Doing well, no further bleeding. Feels stronger.  Objective:  Vital signs in last 24 hours: Temp:  [97.8 F (36.6 C)-98.1 F (36.7 C)] 97.8 F (36.6 C) (01/06 0524) Pulse Rate:  [78-88] 88  (01/06 0524) Resp:  [18-20] 18  (01/06 0524) BP: (108-127)/(76-86) 123/86 mmHg (01/06 0524) SpO2:  [99 %-100 %] 100 % (01/06 0524) Weight:  [111.9 kg (246 lb 11.1 oz)] 246 lb 11.1 oz (111.9 kg) (01/06 0524) Last BM Date: 11/18/11 General:   Alert,  Well-developed,    in NAD Heart:  Regular rate and rhythm; no murmurs Pulm;clear; Abdomen:  Soft, nontender and nondistended. Normal bowel sounds, without guarding, Extremities:  Without edema. Neurologic:  Alert and  oriented x4;  grossly normal neurologically. Psych:  Alert and cooperative. Normal mood and affect.  Intake/Output from previous day: 01/05 0701 - 01/06 0700 In: 1870 [P.O.:1800] Out: 3150 [Urine:3150] Intake/Output this shift:    Lab Results:  Basename 11/20/11 0507 11/19/11 0910 11/18/11 1510 11/18/11 0351 11/17/11 1725  WBC -- -- -- 9.5 11.8*  HGB 10.5* 11.0* 7.8* -- --  HCT 31.0* 32.7* 23.3* -- --  PLT -- -- -- 175 174   BMET  Basename 11/20/11 0507 11/19/11 0725 11/18/11 0351  NA 140 138 138  K 3.4* 3.7 3.6  CL 106 107 109  CO2 27 26 23   GLUCOSE 180* 213* 128*  BUN 7 5* 5*  CREATININE 0.86 0.83 0.83  CALCIUM 8.3* 8.6 7.7*        Assessment / Plan: #1 56 yo female with resolved recurrent diverticular bleed. She is stable for discharge today She needs to follow up with surgery to discuss elective resection further No ASA or NSAIDS. Principal Problem:  *BRBPR (bright red blood per rectum) Active Problems:  DM (diabetes mellitus), type 2 with complications  HYPERTENSION  Diverticulosis of colon with hemorrhage - recurrent  IBS (irritable bowel syndrome)  Shortness of  breath  Anemia due to blood loss, acute     LOS: 6 days   [name]  11/20/2011, 10:38 AM

## 2011-11-20 NOTE — Progress Notes (Signed)
I agree with the above documentation, including the assessment and plan.  

## 2011-11-20 NOTE — Discharge Instructions (Signed)
Follow up routinely with your primary MD. Follow up with Dr Leone Payor within 1 week. Discuss with your primary MD, about obtaining a lung function test, for possible reactive airways disease.

## 2011-11-20 NOTE — Progress Notes (Signed)
Assessment unchanged. Pt verbalized understanding of dc instructions. Discharged via wheelchair to front entrance to meet awaiting vehicle to carry home. Accompanied by nurse tech x2 and family.

## 2011-11-20 NOTE — Progress Notes (Signed)
General Surgery Note  LOS: 6 days  PCP - Merri Brunette, GI Leone Payor.  Assessment/Plan:   1.  BRBPR (bright red blood per rectum) - thought to be diverticular as source. Has been stable.  Feels good. Probable discharge today.    2.  DM (diabetes mellitus), type 2 with complications (06/23/2009)  3.  HYPERTENSION (06/23/2009)     4.  Diverticulosis of colon with hemorrhage - recurrent. She has pan colonic diverticulosis.  Source of bleeding has been suggested in the right colon, but if she comes to surgery, it will be difficult no to do a subtotal colectomy.  5.  IBS (irritable bowel syndrome)  6.  Shortness of breath.  7.  Anemia due to blood loss, acute - stable Hgb 10.5 this AM  Subjective:  Doing well.  Passed flatus, no BM.  Tolerating reg diet.  Objective:   Filed Vitals:   11/20/11 0524  BP: 123/86  Pulse: 88  Temp: 97.8 F (36.6 C)  Resp: 18     Intake/Output from previous day:  01/05 0701 - 01/06 0700 In: 1870 [P.O.:1800] Out: 3150 [Urine:3150]  Intake/Output this shift:      Physical Exam:   General: AA F who is alert and oriented.    HEENT: Normal. Pupils equal. Good dentition. .   Lungs: Clear   Abdomen: Soft.  BS present.   Neurologic:  Grossly intact to motor and sensory function.   Psychiatric: Has normal mood and affect. Behavior is normal   Lab Results:     Basename 11/20/11 0507 11/19/11 0910 11/18/11 0351 11/17/11 1725  WBC -- -- 9.5 11.8*  HGB 10.5* 11.0* -- --  HCT 31.0* 32.7* -- --  PLT -- -- 175 174    BMET    Basename 11/20/11 0507 11/19/11 0725  NA 140 138  K 3.4* 3.7  CL 106 107  CO2 27 26  GLUCOSE 180* 213*  BUN 7 5*  CREATININE 0.86 0.83  CALCIUM 8.3* 8.6    PT/INR  No results found for this basename: LABPROT:2,INR:2 in the last 72 hours  ABG  No results found for this basename: PHART:2,PCO2:2,PO2:2,HCO3:2 in the last 72 hours   Studies/Results:  No results found.   Anti-infectives:   Anti-infectives    None       Ovidio Kin, MD, FACS Pager: [phone],   [facility] Office: [phone] 11/20/2011

## 2011-11-21 ENCOUNTER — Encounter (HOSPITAL_COMMUNITY): Payer: Self-pay | Admitting: Internal Medicine

## 2012-03-28 ENCOUNTER — Ambulatory Visit (HOSPITAL_COMMUNITY)
Admission: RE | Admit: 2012-03-28 | Discharge: 2012-03-28 | Disposition: A | Discharge status: Home / Self Care | Payer: Self-pay | Source: Ambulatory Visit | Attending: Urology | Admitting: Urology

## 2012-03-28 ENCOUNTER — Other Ambulatory Visit (HOSPITAL_COMMUNITY): Payer: Self-pay | Admitting: Urology

## 2012-03-28 DIAGNOSIS — Z85528 Personal history of other malignant neoplasm of kidney: Secondary | ICD-10-CM | POA: Insufficient documentation

## 2012-08-20 ENCOUNTER — Emergency Department (HOSPITAL_COMMUNITY)
Admission: EM | Admit: 2012-08-20 | Discharge: 2012-08-21 | Disposition: A | Discharge status: Home / Self Care | Payer: Self-pay | Attending: Emergency Medicine | Admitting: Emergency Medicine

## 2012-08-20 DIAGNOSIS — Z79899 Other long term (current) drug therapy: Secondary | ICD-10-CM | POA: Insufficient documentation

## 2012-08-20 DIAGNOSIS — M549 Dorsalgia, unspecified: Secondary | ICD-10-CM | POA: Insufficient documentation

## 2012-08-20 DIAGNOSIS — R109 Unspecified abdominal pain: Secondary | ICD-10-CM | POA: Insufficient documentation

## 2012-08-20 DIAGNOSIS — I10 Essential (primary) hypertension: Secondary | ICD-10-CM | POA: Insufficient documentation

## 2012-08-20 DIAGNOSIS — R7309 Other abnormal glucose: Secondary | ICD-10-CM | POA: Insufficient documentation

## 2012-08-20 DIAGNOSIS — R739 Hyperglycemia, unspecified: Secondary | ICD-10-CM

## 2012-08-20 DIAGNOSIS — Z9089 Acquired absence of other organs: Secondary | ICD-10-CM | POA: Insufficient documentation

## 2012-08-20 LAB — URINALYSIS, ROUTINE W REFLEX MICROSCOPIC
Bilirubin Urine: NEGATIVE
Hgb urine dipstick: NEGATIVE
Protein, ur: 100 mg/dL — AB
Specific Gravity, Urine: 1.022 (ref 1.005–1.030)
Urobilinogen, UA: 0.2 mg/dL (ref 0.0–1.0)

## 2012-08-20 LAB — CBC WITH DIFFERENTIAL/PLATELET
Basophils Absolute: 0 10*3/uL (ref 0.0–0.1)
HCT: 43 % (ref 36.0–46.0)
Lymphocytes Relative: 43 % (ref 12–46)
Neutro Abs: 3.5 10*3/uL (ref 1.7–7.7)
Platelets: 205 10*3/uL (ref 150–400)
RDW: 13.7 % (ref 11.5–15.5)
WBC: 7.2 10*3/uL (ref 4.0–10.5)

## 2012-08-20 LAB — URINE MICROSCOPIC-ADD ON

## 2012-08-20 LAB — COMPREHENSIVE METABOLIC PANEL
ALT: 25 U/L (ref 0–35)
AST: 15 U/L (ref 0–37)
CO2: 22 mEq/L (ref 19–32)
Chloride: 98 mEq/L (ref 96–112)
GFR calc non Af Amer: 54 mL/min — ABNORMAL LOW (ref >90)
Potassium: 4 mEq/L (ref 3.5–5.1)
Sodium: 133 mEq/L — ABNORMAL LOW (ref 135–145)
Total Bilirubin: 0.2 mg/dL — ABNORMAL LOW (ref 0.3–1.2)

## 2012-08-20 NOTE — ED Notes (Signed)
Pt c/o of abd pain and leg pain. Pt c/o of nause and headache.pwd

## 2012-08-21 MED ORDER — GLYBURIDE 5 MG PO TABS: 5.0000 mg | ORAL_TABLET | Freq: Two times a day (BID) | ORAL | Status: DC

## 2012-08-21 MED ORDER — DIAZEPAM 5 MG PO TABS: 5.0000 mg | ORAL_TABLET | Freq: Two times a day (BID) | ORAL | Status: DC

## 2012-08-21 MED ORDER — OXYCODONE-ACETAMINOPHEN 5-325 MG PO TABS: 2.0000 | ORAL_TABLET | ORAL | Status: DC | PRN

## 2012-08-21 NOTE — ED Provider Notes (Signed)
History     CSN: [id number]  Arrival date & time 08/20/12  1904   First MD Initiated Contact with Patient 08/21/12 [phone]      Chief Complaint  Patient presents with  . Abdominal Pain  . Leg Pain    (Consider location/radiation/quality/duration/timing/severity/associated sxs/prior treatment) Patient is a 56 y.o. female presenting with abdominal pain and leg pain. The history is provided by the patient.  Abdominal Pain The primary symptoms of the illness include abdominal pain.  Leg Pain    patient complains of right flank and abdominal pain for 2 weeks. Denies any urinary symptoms. No rashes noted. Pain starts in her back and radiates down her leg and is worse with walking and characterized as sharp. No medications taken for this prior to arrival. Denies any fever, vomiting, diarrhea. No association with food.  Past Medical History  Diagnosis Date  . Diverticulosis of colon with hemorrhage 2010, 09/2011  . Hypertension   . Adenomatous colon polyp   . Diabetes mellitus   . Renal cell carcinoma 2004  . Blood transfusion   . IBS (irritable bowel syndrome)   . Hx: UTI (urinary tract infection)     Past Surgical History  Procedure Date  . Nephrectomy     left  . Tubal ligation   . Knee arthroscopy     x2 left  . Hand surgery     x5 right  . Abdominal hysterectomy   . Cholecystectomy   . Upper gastrointestinal endoscopy 12/12/2008    gastroporesis  . Flexible sigmoidoscopy 12/12/2008    diverticulosis  . Colonoscopy 12/19/2008    diverticulosis  . Colonoscopy 11/17/2011    Procedure: COLONOSCOPY;  Surgeon: Erick Blinks, MD;  Location: [facility];  Service: Gastroenterology;  Laterality: N/A;    Family History  Problem Relation Age of Onset  . Colon polyps Mother   . Diabetes Maternal Grandmother   . Diabetes Father   . Colon cancer Neg Hx     History  Substance Use Topics  . Smoking status: Never Smoker   . Smokeless tobacco: Never Used  . Alcohol Use: No     OB History    Grav Para Term Preterm Abortions TAB SAB Ect Mult Living                  Review of Systems  Gastrointestinal: Positive for abdominal pain.  All other systems reviewed and are negative.    Allergies  Review of patient's allergies indicates no known allergies.  Home Medications   Current Outpatient Rx  Name Route Sig Dispense Refill  . ALBUTEROL SULFATE HFA 108 (90 BASE) MCG/ACT IN AERS Inhalation Inhale 2 puffs into the lungs every 6 (six) hours as needed. Shortness of breath.    Marland Kitchen HYOSCYAMINE SULFATE ER 0.375 MG PO TB12 Oral Take 0.375 mg by mouth as needed.     Marland Kitchen SITAGLIPTIN-METFORMIN HCL 50-500 MG PO TABS Oral Take 1 tablet by mouth 2 (two) times daily with a meal. 60 tablet 0  . AMLODIPINE BESYLATE 5 MG PO TABS Oral Take 5 mg by mouth daily.      Marland Kitchen RAMIPRIL 10 MG PO TABS Oral Take 10 mg by mouth daily.       BP 189/104  Pulse 81  Temp 98.4 F (36.9 C) (Oral)  Resp 20  Wt 243 lb (110.224 kg)  SpO2 98%  LMP 11/16/2002  Physical Exam  Nursing note and vitals reviewed. Constitutional: She is oriented to person, place, and time.  She appears well-developed and well-nourished.  Non-toxic appearance. No distress.  HENT:  Head: Normocephalic and atraumatic.  Eyes: Conjunctivae normal, EOM and lids are normal. Pupils are equal, round, and reactive to light.  Neck: Normal range of motion. Neck supple. No tracheal deviation present. No mass present.  Cardiovascular: Normal rate, regular rhythm and normal heart sounds.  Exam reveals no gallop.   No murmur heard. Pulmonary/Chest: Effort normal and breath sounds normal. No stridor. No respiratory distress. She has no decreased breath sounds. She has no wheezes. She has no rhonchi. She has no rales.  Abdominal: Soft. Normal appearance and bowel sounds are normal. She exhibits no distension. There is no tenderness. There is no rebound and no CVA tenderness.  Musculoskeletal: Normal range of motion. She exhibits no  edema and no tenderness.  Neurological: She is alert and oriented to person, place, and time. She has normal strength. No cranial nerve deficit or sensory deficit. GCS eye subscore is 4. GCS verbal subscore is 5. GCS motor subscore is 6.  Skin: Skin is warm and dry. No abrasion and no rash noted.  Psychiatric: She has a normal mood and affect. Her speech is normal and behavior is normal.    ED Course  Procedures (including critical care time)  Labs Reviewed  CBC WITH DIFFERENTIAL - Abnormal; Notable for the following:    RBC 5.12 (*)     All other components within normal limits  COMPREHENSIVE METABOLIC PANEL - Abnormal; Notable for the following:    Sodium 133 (*)     Glucose, Bld 328 (*)     BUN 28 (*)     Creatinine, Ser 1.14 (*)     Albumin 3.2 (*)     Alkaline Phosphatase 128 (*)     Total Bilirubin 0.2 (*)     GFR calc non Af Amer 54 (*)     GFR calc Af Amer 62 (*)     All other components within normal limits  URINALYSIS, ROUTINE W REFLEX MICROSCOPIC - Abnormal; Notable for the following:    APPearance CLOUDY (*)     Glucose, UA >1000 (*)     Protein, ur 100 (*)     All other components within normal limits  GLUCOSE, CAPILLARY - Abnormal; Notable for the following:    Glucose-Capillary 301 (*)     All other components within normal limits  URINE MICROSCOPIC-ADD ON - Abnormal; Notable for the following:    Squamous Epithelial / LPF FEW (*)     Bacteria, UA FEW (*)     All other components within normal limits   No results found.   No diagnosis found.    MDM  Patient to be restarted on her glyburide and she has an appointment to be seen by her Dr. 3 weeks. She's instructed to inform him of her creatinine and follow closely        Toy Baker, MD 08/21/12 [phone]

## 2012-08-23 MED ORDER — GLIMEPIRIDE 4 MG PO TABS: 4.0000 mg | ORAL_TABLET | Freq: Every day | ORAL | Status: DC

## 2012-08-23 NOTE — ED Notes (Signed)
Pt called in on [phone] to request prescription for glimepiride (her usual diabetes med) instead of  Glyburide; prescription written for glimepiride 4mg  po daily ac breakfast #30, no refills; called in to [facility] at [phone]; pt notified per phone.

## 2012-08-23 NOTE — ED Provider Notes (Signed)
Medical screening examination/treatment/procedure(s) were performed by non-physician practitioner and as supervising physician I was immediately available for consultation/collaboration.  Flint Melter, MD 08/23/12 2250

## 2012-08-23 NOTE — ED Provider Notes (Signed)
Pt called Flow Manager requesting change in medication. She was written for Glymeperide instead of  Amaryl. She says she needed refill for Amaryl 4mg  Q before breakfast.  I have written new Rx and asked her to discontinue Glymepiiride.   Dorthula Matas, PA 08/23/12 1504

## 2012-09-20 ENCOUNTER — Ambulatory Visit: Payer: Self-pay | Admitting: Family Medicine

## 2012-10-05 ENCOUNTER — Emergency Department (HOSPITAL_COMMUNITY)
Admission: EM | Admit: 2012-10-05 | Discharge: 2012-10-05 | Disposition: A | Discharge status: Home / Self Care | Payer: Self-pay | Source: Home / Self Care

## 2012-10-05 ENCOUNTER — Encounter (HOSPITAL_COMMUNITY): Payer: Self-pay

## 2012-10-05 DIAGNOSIS — I10 Essential (primary) hypertension: Secondary | ICD-10-CM

## 2012-10-05 DIAGNOSIS — E118 Type 2 diabetes mellitus with unspecified complications: Secondary | ICD-10-CM

## 2012-10-05 LAB — HEMOGLOBIN A1C
Hgb A1c MFr Bld: 13.2 % — ABNORMAL HIGH (ref <5.7)
Mean Plasma Glucose: 332 mg/dL — ABNORMAL HIGH (ref <117)

## 2012-10-05 MED ORDER — METFORMIN HCL 500 MG PO TABS: 500.0000 mg | ORAL_TABLET | Freq: Two times a day (BID) | ORAL | Status: DC

## 2012-10-05 MED ORDER — AMLODIPINE BESYLATE 5 MG PO TABS: 10.0000 mg | ORAL_TABLET | Freq: Every day | ORAL | Status: DC

## 2012-10-05 MED ORDER — GLYBURIDE 5 MG PO TABS: 10.0000 mg | ORAL_TABLET | Freq: Two times a day (BID) | ORAL | Status: DC

## 2012-10-05 MED ORDER — METOPROLOL TARTRATE 50 MG PO TABS: 50.0000 mg | ORAL_TABLET | Freq: Two times a day (BID) | ORAL | Status: DC

## 2012-10-05 NOTE — ED Notes (Signed)
Patient needs medication refills.

## 2012-10-05 NOTE — ED Provider Notes (Signed)
Patient Demographics  [name], is a 56 y.o. female  ZOX:[id number]  WJX:[id number]  DOB - January 24, 1956  Chief Complaint  Patient presents with  . Medication Refill        Subjective:   [facility] today has, No headache, No chest pain, No abdominal pain - No Nausea, No new weakness tingling or numbness, No Cough - SOB.    Objective:    Filed Vitals:   10/05/12 1609  BP: 185/85  Pulse: 100  Temp: 98.6 F (37 C)  TempSrc: Oral  Resp: 18  SpO2: 99%     Exam  Awake Alert, Oriented X 3, No new F.N deficits, Normal affect [place].AT,PERRAL Supple Neck,No JVD, No cervical lymphadenopathy appriciated.  Symmetrical Chest wall movement, Good air movement bilaterally, CTAB RRR,No Gallops,Rubs or new Murmurs, No Parasternal Heave +ve B.Sounds, Abd Soft, Non tender, No organomegaly appriciated, No rebound - guarding or rigidity. No Cyanosis, Clubbing or edema, No new Rash or bruise       Data Review   CBC No results found for this basename: WBC:5,HGB:5,HCT:5,PLT:5,MCV:5,MCH:5,MCHC:5,RDW:5,NEUTRABS:5,LYMPHSABS:5,MONOABS:5,EOSABS:5,BASOSABS:5,BANDABS:5,BANDSABD:5 in the last 168 hours  Chemistries   No results found for this basename: NA:5,K:5,CL:5,CO2:5,GLUCOSE:5,BUN:5,CREATININE:5,GFRCGP,:5,CALCIUM:5,MG:5,AST:5,ALT:5,ALKPHOS:5,BILITOT:5 in the last 168 hours ------------------------------------------------------------------------------------------------------------------ No results found for this basename: HGBA1C:2 in the last 72 hours ------------------------------------------------------------------------------------------------------------------ No results found for this basename: CHOL:2,HDL:2,LDLCALC:2,TRIG:2,CHOLHDL:2,LDLDIRECT:2 in the last 72 hours ------------------------------------------------------------------------------------------------------------------ No results found for this basename: TSH,T4TOTAL,FREET3,T3FREE,THYROIDAB in the last 72  hours ------------------------------------------------------------------------------------------------------------------ No results found for this basename: VITAMINB12:2,FOLATE:2,FERRITIN:2,TIBC:2,IRON:2,RETICCTPCT:2 in the last 72 hours  Coagulation profile  No results found for this basename: INR:5,PROTIME:5 in the last 168 hours     Prior to Admission medications   Medication Sig Start Date End Date Taking? Authorizing Provider  albuterol (PROVENTIL HFA;VENTOLIN HFA) 108 (90 BASE) MCG/ACT inhaler Inhale 2 puffs into the lungs every 6 (six) hours as needed. Shortness of breath.    Historical Provider, MD  amLODipine (NORVASC) 5 MG tablet Take 2 tablets (10 mg total) by mouth daily. 10/05/12   Leroy Sea, MD  diazepam (VALIUM) 5 MG tablet Take 1 tablet (5 mg total) by mouth 2 (two) times daily. 1 tablet every 6 hours as needed for muscle spasm 08/21/12   Toy Baker, MD  glimepiride (AMARYL) 4 MG tablet Take 1 tablet (4 mg total) by mouth daily before breakfast. 08/23/12   Dorthula Matas, PA  glyBURIDE (DIABETA) 5 MG tablet Take 2 tablets (10 mg total) by mouth 2 (two) times daily with a meal. 10/05/12   Leroy Sea, MD  hyoscyamine (LEVBID) 0.375 MG 12 hr tablet Take 0.375 mg by mouth as needed.  09/22/11   Novlet Adelina Mings, MD  metFORMIN (GLUCOPHAGE) 500 MG tablet Take 1 tablet (500 mg total) by mouth 2 (two) times daily with a meal. 10/05/12   Leroy Sea, MD  metoprolol (LOPRESSOR) 50 MG tablet Take 1 tablet (50 mg total) by mouth 2 (two) times daily. 10/05/12   Leroy Sea, MD  oxyCODONE-acetaminophen (PERCOCET/ROXICET) 5-325 MG per tablet Take 2 tablets by mouth every 4 (four) hours as needed for pain. 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   1. Hypertension. In poor control, patient not taking all days and does not want to take any of it anymore, will increase her Norvasc to 10 mg, will add Lopressor, will have patient come back within a week for followup.   2. Diabetes mellitus type 2- will check the A1c, she's not been taking her Gerimed, this morning her sugar was 185, will double her glyburide dose, continue Amaryl at home dose, will place her on Glucophage 500 twice a day instead of one a day which he was taking before, he has been counseled to check her sugars 4 times a day Q. a.c. and at bedtime maintain a log book and bring it next time when she is visiting our clinic within a week.   3. Top normal renal function in a patient with history of nephrectomy, we'll try to avoid ACE and ARB, repeat BMP bring her back in a week to followup on her BMP results.    Follow-up Information    Follow up with Come back to this clinic. Schedule an appointment as soon as possible for a visit in 5 days.          Leroy Sea M.D on 10/05/2012 at 4:17 PM   Leroy Sea, MD 10/05/12 1620

## 2012-10-12 ENCOUNTER — Emergency Department (HOSPITAL_COMMUNITY)
Admission: EM | Admit: 2012-10-12 | Discharge: 2012-10-12 | Disposition: A | Discharge status: Home / Self Care | Payer: Self-pay | Source: Home / Self Care

## 2012-10-12 DIAGNOSIS — I10 Essential (primary) hypertension: Secondary | ICD-10-CM

## 2012-10-12 DIAGNOSIS — E118 Type 2 diabetes mellitus with unspecified complications: Secondary | ICD-10-CM

## 2012-10-12 DIAGNOSIS — Z905 Acquired absence of kidney: Secondary | ICD-10-CM

## 2012-10-12 DIAGNOSIS — K589 Irritable bowel syndrome without diarrhea: Secondary | ICD-10-CM

## 2012-10-12 DIAGNOSIS — IMO0001 Reserved for inherently not codable concepts without codable children: Secondary | ICD-10-CM

## 2012-10-12 LAB — COMPREHENSIVE METABOLIC PANEL
AST: 13 U/L (ref 0–37)
Albumin: 2.8 g/dL — ABNORMAL LOW (ref 3.5–5.2)
Calcium: 9.6 mg/dL (ref 8.4–10.5)
Chloride: 97 mEq/L (ref 96–112)
Creatinine, Ser: 0.87 mg/dL (ref 0.50–1.10)
Total Protein: 7.8 g/dL (ref 6.0–8.3)

## 2012-10-12 LAB — GLUCOSE, CAPILLARY: Glucose-Capillary: 343 mg/dL — ABNORMAL HIGH (ref 70–99)

## 2012-10-12 MED ORDER — HYDROCHLOROTHIAZIDE 12.5 MG PO CAPS: 12.5000 mg | ORAL_CAPSULE | Freq: Every day | ORAL | Status: DC

## 2012-10-12 MED ORDER — AMLODIPINE BESYLATE 10 MG PO TABS: 10.0000 mg | ORAL_TABLET | Freq: Every day | ORAL | Status: DC

## 2012-10-12 MED ORDER — METFORMIN HCL ER 500 MG PO TB24: 1000.0000 mg | ORAL_TABLET | Freq: Two times a day (BID) | ORAL | Status: DC

## 2012-10-12 MED ORDER — INSULIN ASPART 100 UNIT/ML ~~LOC~~ SOLN
SUBCUTANEOUS | Status: AC
  Filled 2012-10-12: qty 1

## 2012-10-12 MED ORDER — INSULIN ASPART 100 UNIT/ML ~~LOC~~ SOLN
8.0000 [IU] | Freq: Once | SUBCUTANEOUS | Status: AC
  Administered 2012-10-12: 8 [IU] via SUBCUTANEOUS

## 2012-10-12 NOTE — ED Notes (Signed)
Patient states  Here for follow up from changes made to her medication states she understands she has to take medication  At the exact times they are prescribed

## 2012-10-12 NOTE — ED Provider Notes (Signed)
History    CSN: [id number]  Arrival date & time 10/12/12  1036  Chief Complaint  Patient presents with  . Follow-up    change in diabetic medication   HPI   The patient is presenting today in followup for his chronic medical conditions and most importantly to get refills on medications which she has been without for some time.  She denies chest pain and shortness of breath and reports that she's been doing fairly well.  She is taking Norvasc and metformin.  She reports that her blood sugars have been elevated.  Past Medical History  Diagnosis Date  . Diverticulosis of colon with hemorrhage 2010, 09/2011  . Hypertension   . Adenomatous colon polyp   . Diabetes mellitus   . Renal cell carcinoma 2004  . Blood transfusion   . IBS (irritable bowel syndrome)   . Hx: UTI (urinary tract infection)     Past Surgical History  Procedure Date  . Nephrectomy     left  . Tubal ligation   . Knee arthroscopy     x2 left  . Hand surgery     x5 right  . Abdominal hysterectomy   . Cholecystectomy   . Upper gastrointestinal endoscopy 12/12/2008    gastroporesis  . Flexible sigmoidoscopy 12/12/2008    diverticulosis  . Colonoscopy 12/19/2008    diverticulosis  . Colonoscopy 11/17/2011    Procedure: COLONOSCOPY;  Surgeon: Erick Blinks, MD;  Location: [facility];  Service: Gastroenterology;  Laterality: N/A;    Family History  Problem Relation Age of Onset  . Colon polyps Mother   . Diabetes Maternal Grandmother   . Diabetes Father   . Colon cancer Neg Hx     History  Substance Use Topics  . Smoking status: Never Smoker   . Smokeless tobacco: Never Used  . Alcohol Use: No    OB History    Grav Para Term Preterm Abortions TAB SAB Ect Mult Living                 Review of Systems  Constitutional: Positive for fatigue. Negative for fever, chills, activity change, appetite change and unexpected weight change.  HENT: Negative.   Eyes: Negative.   Respiratory: Negative.     Cardiovascular: Negative.   Gastrointestinal: Negative.   Genitourinary: Positive for frequency.  Musculoskeletal: Negative.   Neurological: Negative.   Hematological: Negative.   Psychiatric/Behavioral: Negative.     Allergies  Review of patient's allergies indicates no known allergies.  Home Medications   Current Outpatient Rx  Name  Route  Sig  Dispense  Refill  . ALBUTEROL SULFATE HFA 108 (90 BASE) MCG/ACT IN AERS   Inhalation   Inhale 2 puffs into the lungs every 6 (six) hours as needed. Shortness of breath.         . AMLODIPINE BESYLATE 5 MG PO TABS   Oral   Take 2 tablets (10 mg total) by mouth daily.   30 tablet   2   . DIAZEPAM 5 MG PO TABS   Oral   Take 1 tablet (5 mg total) by mouth 2 (two) times daily. 1 tablet every 6 hours as needed for muscle spasm   15 tablet   0   . GLIMEPIRIDE 4 MG PO TABS   Oral   Take 1 tablet (4 mg total) by mouth daily before breakfast.   30 tablet   0     Discontinue Glimepiride.   . GLYBURIDE 5 MG PO TABS  Oral   Take 2 tablets (10 mg total) by mouth 2 (two) times daily with a meal.   60 tablet   0   . HYOSCYAMINE SULFATE ER 0.375 MG PO TB12   Oral   Take 0.375 mg by mouth as needed.          Marland Kitchen METFORMIN HCL 500 MG PO TABS   Oral   Take 1 tablet (500 mg total) by mouth 2 (two) times daily with a meal.   60 tablet   1   . METOPROLOL TARTRATE 50 MG PO TABS   Oral   Take 1 tablet (50 mg total) by mouth 2 (two) times daily.   60 tablet   1   . OXYCODONE-ACETAMINOPHEN 5-325 MG PO TABS   Oral   Take 2 tablets by mouth every 4 (four) hours as needed for pain.   10 tablet   0     BP 171/88  Pulse 83  Temp 98.6 F (37 C) (Oral)  Resp 20  SpO2 100%  LMP 11/16/2002  Physical Exam  Constitutional: She is oriented to person, place, and time. She appears well-developed and well-nourished. No distress.  HENT:  Head: Normocephalic and atraumatic.  Eyes: EOM are normal. Pupils are equal, round, and  reactive to light.  Neck: Normal range of motion. Neck supple. No JVD present. No tracheal deviation present. No thyromegaly present.  Cardiovascular: Normal rate, regular rhythm and normal heart sounds.   Abdominal: Soft. Bowel sounds are normal.  Musculoskeletal: Normal range of motion.  Neurological: She is alert and oriented to person, place, and time.  Skin: Skin is warm and dry.  Psychiatric: She has a normal mood and affect. Her behavior is normal. Judgment and thought content normal.    ED Course  Procedures (including critical care time)  Labs Reviewed - No data to display No results found.   No diagnosis found.    MDM  IMPRESSION  Poorly controlled type 2 diabetes mellitus as evidenced by an A1c of greater than 13%  Hypertension  Status post nephrectomy  RECOMMENDATIONS / PLAN I had a long conversation with the patient today about the poorly controlled diabetes mellitus and the need for insulin.  This patient is at high-risk for the acute and chronic complications of poorly controlled type 2 diabetes mellitus.  I explained this to her in detail.  I explained to her that her remaining kidney could be severely damaged by poorly controlled blood pressure and diabetes mellitus.  The patient's not willing to start insulin at this time.  The patient is going to consider her options.  In terms of medical management we did increase her metformin to 1000 mg twice daily, continue the sulfonylurea treatment as ordered.  I refilled her blood pressure medications and asked her to monitor her blood pressure at home and come in 2 weeks to this clinic to have a nurse visit to have blood pressure checked.  Also will have her return to the clinic in one month to make a final decision about starting insulin.  I strongly encouraged that.  I also told her that I could use generic insulins that would be less expensive than some of the analog insulins which she cannot afford at this time.  She  verbalized understanding.  She should call us in 2 weeks with her blood glucose readings.   FOLLOW UP 2 weeks for call to the clinic with blood glucose readings and blood pressure check One month for medical  followup to discuss starting insulin for poorly controlled type 2 diabetes mellitus, insulin requiring  The patient was given clear instructions to go to ER or return to medical center if symptoms don't improve, worsen or new problems develop.  The patient verbalized understanding.  The patient was told to call to get lab results if they haven't heard anything in the next week.            Cleora Fleet, MD 10/12/12 1857

## 2012-11-09 ENCOUNTER — Emergency Department (HOSPITAL_COMMUNITY)
Admission: EM | Admit: 2012-11-09 | Discharge: 2012-11-09 | Disposition: A | Discharge status: Home / Self Care | Payer: Self-pay | Source: Home / Self Care

## 2012-11-09 ENCOUNTER — Encounter (HOSPITAL_COMMUNITY): Payer: Self-pay

## 2012-11-09 DIAGNOSIS — I10 Essential (primary) hypertension: Secondary | ICD-10-CM

## 2012-11-09 DIAGNOSIS — E118 Type 2 diabetes mellitus with unspecified complications: Secondary | ICD-10-CM

## 2012-11-09 LAB — GLUCOSE, CAPILLARY: Glucose-Capillary: 133 mg/dL — ABNORMAL HIGH (ref 70–99)

## 2012-11-09 MED ORDER — GLYBURIDE 5 MG PO TABS: 10.0000 mg | ORAL_TABLET | Freq: Two times a day (BID) | ORAL | Status: DC

## 2012-11-09 MED ORDER — METOPROLOL TARTRATE 50 MG PO TABS: 25.0000 mg | ORAL_TABLET | Freq: Two times a day (BID) | ORAL | Status: DC

## 2012-11-09 NOTE — ED Provider Notes (Signed)
History     CSN: [id number]  Arrival date & time 11/09/12  1010   None     Chief Complaint  Patient presents with  . Follow-up    (Consider location/radiation/quality/duration/timing/severity/associated sxs/prior treatment) HPI Patient became for a followup for hypertension and diabetes mellitus. Patient has been taking glyburide and metformin and does not want to take insulin. She does with that her blood sugar now range between 175 and 200 at home. She also was given hydrochlorothiazide along with Norvasc and metoprolol for hypertension. Patient tells me that she did not take metoprolol and today her blood pressure is 163/83. She denies any chest pain shortness of breath no headache no blurred vision no nausea vomiting or diarrhea.  Past Medical History  Diagnosis Date  . Diverticulosis of colon with hemorrhage 2010, 09/2011  . Hypertension   . Adenomatous colon polyp   . Diabetes mellitus   . Renal cell carcinoma 2004  . Blood transfusion   . IBS (irritable bowel syndrome)   . Hx: UTI (urinary tract infection)     Past Surgical History  Procedure Date  . Nephrectomy     left  . Tubal ligation   . Knee arthroscopy     x2 left  . Hand surgery     x5 right  . Abdominal hysterectomy   . Cholecystectomy   . Upper gastrointestinal endoscopy 12/12/2008    gastroporesis  . Flexible sigmoidoscopy 12/12/2008    diverticulosis  . Colonoscopy 12/19/2008    diverticulosis  . Colonoscopy 11/17/2011    Procedure: COLONOSCOPY;  Surgeon: Erick Blinks, MD;  Location: [facility];  Service: Gastroenterology;  Laterality: N/A;    Family History  Problem Relation Age of Onset  . Colon polyps Mother   . Diabetes Maternal Grandmother   . Diabetes Father   . Colon cancer Neg Hx     History  Substance Use Topics  . Smoking status: Never Smoker   . Smokeless tobacco: Never Used  . Alcohol Use: No    OB History    Grav Para Term Preterm Abortions TAB SAB Ect Mult Living           Review of Systems Review of Systems:  HEENT: Denies headache, blurred vision, runny nose, sore throat,  Chest : Denies shortness of breath, no history of COPD Heart : Denies Chest pain,  coronary arterey disease GI: Denies  nausea, vomiting, diarrhea, constipation       Allergies  Review of patient's allergies indicates no known allergies.  Home Medications   Current Outpatient Rx  Name  Route  Sig  Dispense  Refill  . ALBUTEROL SULFATE HFA 108 (90 BASE) MCG/ACT IN AERS   Inhalation   Inhale 2 puffs into the lungs every 6 (six) hours as needed. Shortness of breath.         . AMLODIPINE BESYLATE 10 MG PO TABS   Oral   Take 1 tablet (10 mg total) by mouth daily.   30 tablet   2   . DIAZEPAM 5 MG PO TABS   Oral   Take 1 tablet (5 mg total) by mouth 2 (two) times daily. 1 tablet every 6 hours as needed for muscle spasm   15 tablet   0   . GLYBURIDE 5 MG PO TABS   Oral   Take 2 tablets (10 mg total) by mouth 2 (two) times daily with a meal.   60 tablet   1   . HYDROCHLOROTHIAZIDE 12.5 MG PO  CAPS   Oral   Take 1 capsule (12.5 mg total) by mouth daily.   30 capsule   4   . HYOSCYAMINE SULFATE ER 0.375 MG PO TB12   Oral   Take 0.375 mg by mouth as needed.          Marland Kitchen METFORMIN HCL ER 500 MG PO TB24   Oral   Take 2 tablets (1,000 mg total) by mouth 2 (two) times daily with a meal.   120 tablet   4   . METOPROLOL TARTRATE 50 MG PO TABS   Oral   Take 0.5 tablets (25 mg total) by mouth 2 (two) times daily.   60 tablet   2   . OXYCODONE-ACETAMINOPHEN 5-325 MG PO TABS   Oral   Take 2 tablets by mouth every 4 (four) hours as needed for pain.   10 tablet   0     BP 163/83  Pulse 84  Temp 98.6 F (37 C) (Oral)  Resp 19  SpO2 100%  LMP 11/16/2002  Physical Exam  Constitutional:   Patient is a morbidly obese female in no acute distress and cooperative with exam. Head: Normocephalic and atraumatic Mouth: Mucus membranes moist Eyes: PERRL,  EOMI, conjunctivae normal Cardiovascular: RRR, S1 normal, S2 normal Pulmonary/Chest: CTAB, no wheezes, rales, or rhonchi Abdominal: Soft. Non-tender, non-distended, bowel sounds are normal, no masses, organomegaly, or guarding present.  Neurological: A&O x3, Strenght is normal and symmetric bilaterally, cranial nerve II-XII are grossly intact, no focal motor deficit, sensory intact to light touch bilaterally.  Extremities : No Cyanosis, Clubbing or Edema   ED Course  Procedures (including critical care time)  Labs Reviewed  GLUCOSE, CAPILLARY - Abnormal; Notable for the following:    Glucose-Capillary 133 (*)     All other components within normal limits   No results found.   1. Hypertension   2. DM (diabetes mellitus), type 2 with complications   3. HYPERTENSION   4. OBESITY, MORBID    Hypertension I will change metoprolol to 25 mg by mouth twice a day Continue taking Norvasc 10 mg by mouth daily Continue taking hydrochlorothiazide 12.5 mg by mouth daily Patient will followup with the nurse in 2 weeks to check her blood pressure  Diabetes mellitus Continue glyburide 10 mg by mouth twice a day Continue metformin 1000 mg by mouth twice a day Patient will follow up with the nurse in 2 weeks to check her blood glucose We will also check a hemoglobin A1c in the next visit at one month  Morbid obesity Patient given counseling on diet, exercise, importance of weight loss  She will followup with [place] in one month MDM

## 2012-11-09 NOTE — ED Notes (Signed)
Follow up history of DM and hypertension

## 2012-12-17 ENCOUNTER — Ambulatory Visit: Payer: Self-pay | Admitting: Family Medicine

## 2012-12-18 ENCOUNTER — Emergency Department (HOSPITAL_COMMUNITY)
Admission: EM | Admit: 2012-12-18 | Discharge: 2012-12-18 | Disposition: A | Discharge status: Home / Self Care | Payer: Self-pay | Source: Home / Self Care

## 2012-12-18 ENCOUNTER — Encounter (HOSPITAL_COMMUNITY): Payer: Self-pay

## 2012-12-18 DIAGNOSIS — L732 Hidradenitis suppurativa: Secondary | ICD-10-CM

## 2012-12-18 DIAGNOSIS — I10 Essential (primary) hypertension: Secondary | ICD-10-CM

## 2012-12-18 DIAGNOSIS — Z8614 Personal history of Methicillin resistant Staphylococcus aureus infection: Secondary | ICD-10-CM

## 2012-12-18 DIAGNOSIS — L02411 Cutaneous abscess of right axilla: Secondary | ICD-10-CM

## 2012-12-18 DIAGNOSIS — IMO0002 Reserved for concepts with insufficient information to code with codable children: Secondary | ICD-10-CM

## 2012-12-18 MED ORDER — DOXYCYCLINE HYCLATE 100 MG PO TABS: 100.0000 mg | ORAL_TABLET | Freq: Two times a day (BID) | ORAL | Status: DC

## 2012-12-18 NOTE — ED Provider Notes (Signed)
History    CSN: [id number]  Arrival date & time 12/18/12  1618   First MD Initiated Contact with Patient 12/18/12 1650     Chief Complaint  Patient presents with  . Rash    under right arm   The history is provided by the patient.   Pt reports that she had shaved her arms and reports that it has developed an abscess and getting worse.  It is swollen and more painful.  Pt denies fever and chills nausea and emesis.    Past Medical History  Diagnosis Date  . Diverticulosis of colon with hemorrhage 2010, 09/2011  . Hypertension   . Adenomatous colon polyp   . Diabetes mellitus   . Renal cell carcinoma 2004  . Blood transfusion   . IBS (irritable bowel syndrome)   . Hx: UTI (urinary tract infection)     Past Surgical History  Procedure Date  . Nephrectomy     left  . Tubal ligation   . Knee arthroscopy     x2 left  . Hand surgery     x5 right  . Abdominal hysterectomy   . Cholecystectomy   . Upper gastrointestinal endoscopy 12/12/2008    gastroporesis  . Flexible sigmoidoscopy 12/12/2008    diverticulosis  . Colonoscopy 12/19/2008    diverticulosis  . Colonoscopy 11/17/2011    Procedure: COLONOSCOPY;  Surgeon: Erick Blinks, MD;  Location: [facility];  Service: Gastroenterology;  Laterality: N/A;    Family History  Problem Relation Age of Onset  . Colon polyps Mother   . Diabetes Maternal Grandmother   . Diabetes Father   . Colon cancer Neg Hx     History  Substance Use Topics  . Smoking status: Never Smoker   . Smokeless tobacco: Never Used  . Alcohol Use: No    OB History    Grav Para Term Preterm Abortions TAB SAB Ect Mult Living                 Review of Systems  Skin: Positive for rash and wound.       Infected hair follicles under right axillary area  All other systems reviewed and are negative.    Allergies  Review of patient's allergies indicates no known allergies.  Home Medications   Current Outpatient Rx  Name  Route  Sig  Dispense   Refill  . ALBUTEROL SULFATE HFA 108 (90 BASE) MCG/ACT IN AERS   Inhalation   Inhale 2 puffs into the lungs every 6 (six) hours as needed. Shortness of breath.         . AMLODIPINE BESYLATE 10 MG PO TABS   Oral   Take 1 tablet (10 mg total) by mouth daily.   30 tablet   2   . DIAZEPAM 5 MG PO TABS   Oral   Take 1 tablet (5 mg total) by mouth 2 (two) times daily. 1 tablet every 6 hours as needed for muscle spasm   15 tablet   0   . GLYBURIDE 5 MG PO TABS   Oral   Take 2 tablets (10 mg total) by mouth 2 (two) times daily with a meal.   60 tablet   1   . HYDROCHLOROTHIAZIDE 12.5 MG PO CAPS   Oral   Take 1 capsule (12.5 mg total) by mouth daily.   30 capsule   4   . HYOSCYAMINE SULFATE ER 0.375 MG PO TB12   Oral   Take 0.375 mg by  mouth as needed.          Marland Kitchen METFORMIN HCL ER 500 MG PO TB24   Oral   Take 2 tablets (1,000 mg total) by mouth 2 (two) times daily with a meal.   120 tablet   4   . METOPROLOL TARTRATE 50 MG PO TABS   Oral   Take 0.5 tablets (25 mg total) by mouth 2 (two) times daily.   60 tablet   2   . OXYCODONE-ACETAMINOPHEN 5-325 MG PO TABS   Oral   Take 2 tablets by mouth every 4 (four) hours as needed for pain.   10 tablet   0     BP 139/95  Pulse 93  Temp 98.2 F (36.8 C) (Oral)  Resp 16  SpO2 100%  LMP 11/16/2002  Physical Exam  Nursing note and vitals reviewed. Constitutional: She is oriented to person, place, and time. She appears well-developed and well-nourished. No distress.  HENT:  Head: Normocephalic and atraumatic.  Eyes: Conjunctivae normal and EOM are normal. Pupils are equal, round, and reactive to light.  Neck: Normal range of motion. Neck supple. No JVD present.  Cardiovascular: Normal rate and regular rhythm.   Pulmonary/Chest: Effort normal.  Abdominal: Soft.  Musculoskeletal:       Right upper arm: She exhibits tenderness.       Hidradenitis lesions under her right axillary area Large abscess noted proximally 7  cm in diameter and fluctuant with minimal drainage of purulent material  Lymphadenopathy:    She has no cervical adenopathy.  Neurological: She is alert and oriented to person, place, and time.    ED Course  INCISION AND DRAINAGE Date/Time: 12/18/2012 6:22 PM Performed by: Cleora Fleet Authorized by: Cleora Fleet Consent: Verbal consent obtained. Written consent not obtained. Risks and benefits: risks, benefits and alternatives were discussed Consent given by: patient Patient understanding: patient states understanding of the procedure being performed Type: abscess Body area: upper extremity Location details: right arm Anesthesia: local infiltration Local anesthetic: lidocaine 2% with epinephrine Anesthetic total: 3 ml Patient sedated: no Scalpel size: 15 Incision type: single straight Complexity: simple Drainage: purulent and bloody Wound treatment: wound left open Packing material: 1/4 in iodoform gauze Patient tolerance: Patient tolerated the procedure well with no immediate complications.   (including critical care time)  Labs Reviewed - No data to display No results found.  No diagnosis found.  MDM  IMPRESSION  Hidradenitis suppurativa  Abscess, left axilla  Hypertension  Diabetes Mellitus, type 2   History of MRSA  RECOMMENDATIONS / PLAN Incision and drainage of abscess performed in the office today.  The patient tolerated the procedure very well.  It was packed with iodoform gauze.  The patient is going to return to clinic tomorrow to have the wound repacked.  The patient was started on doxycycline 100 mg by mouth twice a day.  Local wound care instructions provided to patient today.  A wound culture was taken today.  It was sent to the lab for culture.  The patient does have a history of MRSA.  The patient was given clear wound care instructions and written instructions.  The patient tolerated the procedure well.  FOLLOW UP Tomorrow for  reevaluation  The patient was given clear instructions to go to ER or return to medical center if symptoms don't improve, worsen or new problems develop.  The patient verbalized understanding.  The patient was told to call to get lab results if they haven't heard  anything in the next week.            Cleora Fleet, MD 12/18/12 2048

## 2012-12-18 NOTE — ED Notes (Signed)
Patient states shaved under her arms about a month ago and now has developed A rash under the right arm with an odor

## 2012-12-19 ENCOUNTER — Emergency Department (INDEPENDENT_AMBULATORY_CARE_PROVIDER_SITE_OTHER)
Admission: EM | Admit: 2012-12-19 | Discharge: 2012-12-19 | Disposition: A | Discharge status: Home / Self Care | Payer: Self-pay | Source: Home / Self Care

## 2012-12-19 DIAGNOSIS — L0291 Cutaneous abscess, unspecified: Secondary | ICD-10-CM

## 2012-12-19 MED ORDER — CHLORHEXIDINE GLUCONATE 4 % EX LIQD: CUTANEOUS | Status: DC

## 2012-12-19 MED ORDER — MUPIROCIN 2 % EX OINT: TOPICAL_OINTMENT | Freq: Three times a day (TID) | CUTANEOUS | Status: DC

## 2012-12-19 NOTE — ED Notes (Signed)
Patient here to have packing from wound under right arm removed and possibly re- packed

## 2012-12-24 ENCOUNTER — Encounter (HOSPITAL_COMMUNITY): Payer: Self-pay

## 2012-12-24 LAB — WOUND CULTURE: Gram Stain: NONE SEEN

## 2012-12-24 NOTE — Progress Notes (Signed)
Quick Note:  Please notify patient that final wound culture identifies [facility] Estanislado Spire, MD, CDE, FAAFP [facility] [employer] [place], [place]   ______

## 2012-12-29 ENCOUNTER — Encounter: Payer: Self-pay | Admitting: Internal Medicine

## 2013-01-12 ENCOUNTER — Emergency Department (HOSPITAL_COMMUNITY)
Admission: EM | Admit: 2013-01-12 | Discharge: 2013-01-12 | Disposition: A | Discharge status: Home / Self Care | Payer: Self-pay | Attending: Emergency Medicine | Admitting: Emergency Medicine

## 2013-01-12 DIAGNOSIS — Z8719 Personal history of other diseases of the digestive system: Secondary | ICD-10-CM | POA: Insufficient documentation

## 2013-01-12 DIAGNOSIS — Z79899 Other long term (current) drug therapy: Secondary | ICD-10-CM | POA: Insufficient documentation

## 2013-01-12 DIAGNOSIS — K625 Hemorrhage of anus and rectum: Secondary | ICD-10-CM | POA: Insufficient documentation

## 2013-01-12 DIAGNOSIS — I10 Essential (primary) hypertension: Secondary | ICD-10-CM | POA: Insufficient documentation

## 2013-01-12 DIAGNOSIS — R197 Diarrhea, unspecified: Secondary | ICD-10-CM | POA: Insufficient documentation

## 2013-01-12 DIAGNOSIS — K921 Melena: Secondary | ICD-10-CM | POA: Insufficient documentation

## 2013-01-12 DIAGNOSIS — R109 Unspecified abdominal pain: Secondary | ICD-10-CM | POA: Insufficient documentation

## 2013-01-12 DIAGNOSIS — E119 Type 2 diabetes mellitus without complications: Secondary | ICD-10-CM | POA: Insufficient documentation

## 2013-01-12 DIAGNOSIS — Z85528 Personal history of other malignant neoplasm of kidney: Secondary | ICD-10-CM | POA: Insufficient documentation

## 2013-01-12 DIAGNOSIS — Z860100 Personal history of colon polyps, unspecified: Secondary | ICD-10-CM | POA: Insufficient documentation

## 2013-01-12 DIAGNOSIS — Z87440 Personal history of urinary (tract) infections: Secondary | ICD-10-CM | POA: Insufficient documentation

## 2013-01-12 DIAGNOSIS — R112 Nausea with vomiting, unspecified: Secondary | ICD-10-CM | POA: Insufficient documentation

## 2013-01-12 LAB — OCCULT BLOOD, POC DEVICE: Fecal Occult Bld: POSITIVE — AB

## 2013-01-12 LAB — CBC WITH DIFFERENTIAL/PLATELET
Basophils Relative: 0 % (ref 0–1)
Eosinophils Absolute: 0.1 10*3/uL (ref 0.0–0.7)
Eosinophils Relative: 2 % (ref 0–5)
MCH: 28 pg (ref 26.0–34.0)
MCHC: 32.6 g/dL (ref 30.0–36.0)
MCV: 86 fL (ref 78.0–100.0)
Neutrophils Relative %: 53 % (ref 43–77)
Platelets: 228 10*3/uL (ref 150–400)

## 2013-01-12 LAB — COMPREHENSIVE METABOLIC PANEL
Albumin: 3 g/dL — ABNORMAL LOW (ref 3.5–5.2)
Alkaline Phosphatase: 116 U/L (ref 39–117)
BUN: 20 mg/dL (ref 6–23)
Chloride: 100 mEq/L (ref 96–112)
Creatinine, Ser: 0.87 mg/dL (ref 0.50–1.10)
GFR calc Af Amer: 85 mL/min — ABNORMAL LOW (ref >90)
GFR calc non Af Amer: 74 mL/min — ABNORMAL LOW (ref >90)
Glucose, Bld: 318 mg/dL — ABNORMAL HIGH (ref 70–99)
Total Bilirubin: 0.2 mg/dL — ABNORMAL LOW (ref 0.3–1.2)

## 2013-01-12 LAB — SAMPLE TO BLOOD BANK

## 2013-01-12 LAB — PROTIME-INR
INR: 0.93 (ref 0.00–1.49)
Prothrombin Time: 12.4 seconds (ref 11.6–15.2)

## 2013-01-12 MED ORDER — MORPHINE SULFATE 4 MG/ML IJ SOLN
4.0000 mg | Freq: Once | INTRAMUSCULAR | Status: AC
  Administered 2013-01-12: 4 mg via INTRAVENOUS
  Filled 2013-01-12: qty 1

## 2013-01-12 MED ORDER — PROMETHAZINE HCL 25 MG PO TABS: 25.0000 mg | ORAL_TABLET | Freq: Four times a day (QID) | ORAL | Status: DC | PRN

## 2013-01-12 MED ORDER — CIPROFLOXACIN HCL 500 MG PO TABS: 500.0000 mg | ORAL_TABLET | Freq: Two times a day (BID) | ORAL | Status: DC

## 2013-01-12 MED ORDER — METRONIDAZOLE 500 MG PO TABS: 500.0000 mg | ORAL_TABLET | Freq: Three times a day (TID) | ORAL | Status: DC

## 2013-01-12 MED ORDER — ONDANSETRON HCL 4 MG/2ML IJ SOLN
4.0000 mg | Freq: Once | INTRAMUSCULAR | Status: AC
  Administered 2013-01-12: 4 mg via INTRAVENOUS
  Filled 2013-01-12: qty 2

## 2013-01-12 NOTE — ED Notes (Signed)
She c/o having had some 5 bowel movements today; in all of which she has seen blood. The first of these bowel movements contained very dark, maroon and "black" blood; the most recent of which contained only bright red blood. She also tells me that she is having some left lower quadrant abd. Discomfort.  Her skin is normal, warm and dry and she is breathing normally.  She denies fever/n/v.

## 2013-01-12 NOTE — ED Notes (Signed)
Patient requesting pain medicine

## 2013-01-12 NOTE — ED Provider Notes (Signed)
History     CSN: [id number]  Arrival date & time 01/12/13  1033   First MD Initiated Contact with Patient 01/12/13 1048      Chief Complaint  Patient presents with  . Rectal Bleeding    (Consider location/radiation/quality/duration/timing/severity/associated sxs/prior treatment) HPI Comments: Patient with a history of recurrent diverticulosis bleeds and Colon Polyps presents today with a chief complaint of blood in her stool.  She reports that today she began noticing both dark stool and a small amount of bright red blood in her stool this morning.  She also reports that she has been having intermittent vomiting and intermittent diarrhea over the past 2 weeks.  Last episode of vomiting was three days ago.  No blood in her emesis. Today she is having mild LLQ discomfort.  She denies fever or chills.  No history of Diverticulitis.  She is currently not on any blood thinning medications.  She denies dizziness, lightheadedness, SOB, weakness, or syncope.    Patient is a 57 y.o. female presenting with hematochezia. The history is provided by the patient.  Rectal Bleeding  The stool is described as mixed with blood and liquid. Associated symptoms include abdominal pain, diarrhea, nausea and vomiting. Pertinent negatives include no fever, no hematemesis, no rectal pain, no hematuria and no difficulty breathing. Her past medical history does not include recent antibiotic use. There were no sick contacts.    Past Medical History  Diagnosis Date  . Diverticulosis of colon with hemorrhage 2010, 09/2011  . Hypertension   . Personal history of colonic polyps     adenomas since 2010  . Diabetes mellitus   . Renal cell carcinoma 2004  . Blood transfusion   . IBS (irritable bowel syndrome)   . Hx: UTI (urinary tract infection)     Past Surgical History  Procedure Laterality Date  . Nephrectomy      left  . Tubal ligation    . Knee arthroscopy      x2 left  . Hand surgery      x5 right  .  Abdominal hysterectomy    . Cholecystectomy    . Upper gastrointestinal endoscopy  12/12/2008    gastroporesis  . Flexible sigmoidoscopy  12/12/2008    diverticulosis  . Colonoscopy  12/19/2008    diverticulosis  . Colonoscopy  11/17/2011    Procedure: COLONOSCOPY;  Surgeon: Erick Blinks, MD;  Location: [facility];  Service: Gastroenterology;  Laterality: N/A;    Family History  Problem Relation Age of Onset  . Colon polyps Mother   . Diabetes Maternal Grandmother   . Diabetes Father   . Colon cancer Neg Hx     History  Substance Use Topics  . Smoking status: Never Smoker   . Smokeless tobacco: Never Used  . Alcohol Use: No    OB History   Grav Para Term Preterm Abortions TAB SAB Ect Mult Living                  Review of Systems  Constitutional: Negative for fever and chills.  Respiratory: Negative for shortness of breath.   Gastrointestinal: Positive for nausea, vomiting, abdominal pain, diarrhea, blood in stool and hematochezia. Negative for constipation, rectal pain and hematemesis.  Genitourinary: Negative for dysuria, urgency, frequency and hematuria.  Neurological: Negative for dizziness, syncope and light-headedness.    Allergies  Review of patient's allergies indicates no known allergies.  Home Medications   Current Outpatient Rx  Name  Route  Sig  Dispense  Refill  . amLODipine (NORVASC) 10 MG tablet   Oral   Take 1 tablet (10 mg total) by mouth daily.   30 tablet   2   . glyBURIDE (DIABETA) 5 MG tablet   Oral   Take 2 tablets (10 mg total) by mouth 2 (two) times daily with a meal.   60 tablet   1   . hydrochlorothiazide (MICROZIDE) 12.5 MG capsule   Oral   Take 1 capsule (12.5 mg total) by mouth daily.   30 capsule   4   . hyoscyamine (LEVBID) 0.375 MG 12 hr tablet   Oral   Take 0.375 mg by mouth as needed (cramping).          . metFORMIN (GLUCOPHAGE XR) 500 MG 24 hr tablet   Oral   Take 2 tablets (1,000 mg total) by mouth 2 (two) times  daily with a meal.   120 tablet   4     BP 146/69  Pulse 85  Temp(Src) 98.8 F (37.1 C) (Oral)  Resp 18  Ht 5\' 1"  (1.549 m)  Wt 243 lb (110.224 kg)  BMI 45.94 kg/m2  SpO2 100%  LMP 11/16/2002  Physical Exam  Nursing note and vitals reviewed. Constitutional: She appears well-developed and well-nourished. No distress.  HENT:  Head: Normocephalic and atraumatic.  Cardiovascular: Normal rate, regular rhythm and normal heart sounds.   Pulmonary/Chest: Effort normal and breath sounds normal.  Abdominal: Soft. Bowel sounds are normal. She exhibits no distension and no mass. There is no tenderness. There is no rebound and no guarding.  Genitourinary: Rectal exam shows no external hemorrhoid, no internal hemorrhoid, no fissure, no mass and no tenderness. Guaiac positive stool.  No gross bleeding visualized on rectal exam  Neurological: She is alert.  Skin: Skin is warm and dry. She is not diaphoretic.  Psychiatric: She has a normal mood and affect.    ED Course  Procedures (including critical care time)  Labs Reviewed  COMPREHENSIVE METABOLIC PANEL - Abnormal; Notable for the following:    Glucose, Bld 318 (*)    Albumin 3.0 (*)    Total Bilirubin 0.2 (*)    GFR calc non Af Amer 74 (*)    GFR calc Af Amer 85 (*)    All other components within normal limits  OCCULT BLOOD, POC DEVICE - Abnormal; Notable for the following:    Fecal Occult Bld POSITIVE (*)    All other components within normal limits  CBC WITH DIFFERENTIAL  PROTIME-INR  APTT  SAMPLE TO BLOOD BANK   No results found.   No diagnosis found.  Patient discussed with Dr. Juleen China.  MDM  Patient with a history of recurrent diverticulosis with hemorrhage presents today with a chief complaint of rectal bleeding.  She describes both melena and hematochezia.  No gross blood visualized on rectal exam, but hemoccult is positive.  Hemoglobin today is stable at 12.2.  Patient is not having any symptoms of anemia.  Patient  has been having vomiting, diarrhea, and mild LLQ pain.  Will treat patient with Cipro and Flagyl.  She has an appointment scheduled with GI in four days.  Feel that patient is stable for discharge at this time.  Strict return precautions given to the patient.          Pascal Lux Doolittle, PA-C 01/13/13 [phone]

## 2013-01-15 NOTE — ED Provider Notes (Signed)
Medical screening examination/treatment/procedure(s) were performed by non-physician practitioner and as supervising physician I was immediately available for consultation/collaboration.  Raeford Razor, MD 01/15/13 [phone]

## 2013-01-16 ENCOUNTER — Encounter: Payer: Self-pay | Admitting: Internal Medicine

## 2013-01-16 ENCOUNTER — Ambulatory Visit (INDEPENDENT_AMBULATORY_CARE_PROVIDER_SITE_OTHER): Payer: Self-pay | Admitting: Internal Medicine

## 2013-01-16 ENCOUNTER — Telehealth: Payer: Self-pay

## 2013-01-16 VITALS — BP 146/86 | HR 96 | Ht 60.75 in | Wt 238.1 lb

## 2013-01-16 DIAGNOSIS — R1032 Left lower quadrant pain: Secondary | ICD-10-CM

## 2013-01-16 DIAGNOSIS — D126 Benign neoplasm of colon, unspecified: Secondary | ICD-10-CM

## 2013-01-16 DIAGNOSIS — K5731 Diverticulosis of large intestine without perforation or abscess with bleeding: Secondary | ICD-10-CM

## 2013-01-16 DIAGNOSIS — K589 Irritable bowel syndrome without diarrhea: Secondary | ICD-10-CM

## 2013-01-16 DIAGNOSIS — K921 Melena: Secondary | ICD-10-CM

## 2013-01-16 MED ORDER — DICYCLOMINE HCL 20 MG PO TABS: 20.0000 mg | ORAL_TABLET | Freq: Four times a day (QID) | ORAL | Status: DC | PRN

## 2013-01-16 MED ORDER — NA SULFATE-K SULFATE-MG SULF 17.5-3.13-1.6 GM/177ML PO SOLN: ORAL | Status: DC

## 2013-01-16 NOTE — Progress Notes (Signed)
Subjective:    Patient ID: [name], female    DOB: 1956-05-25, 57 y.o.   MRN: [id number]  HPI Leonette is here for follow-up after a visit to the ED. She had been having crmpy LLQ pain, sometimes sharp and bloody stools at times - dark and bright blood. Was heme + in ED last week with NL Hgb. Stools have been loose. Reports dark blood as late as yesterday, no stools since. She was prescribed cipro and metronidazole in the ED but did not take it. She had a colonoscopy last year for diverticular bleed and a polyp was seen but not removed.  Since I last saw her she has gone on  Disability for carpal tunnel syndrome and is awaiting onset of insurance April 1  No Known Allergies Outpatient Prescriptions Prior to Visit  Medication Sig Dispense Refill  . amLODipine (NORVASC) 10 MG tablet Take 1 tablet (10 mg total) by mouth daily.  30 tablet  2  . glyBURIDE (DIABETA) 5 MG tablet Take 2 tablets (10 mg total) by mouth 2 (two) times daily with a meal.  60 tablet  1  . hydrochlorothiazide (MICROZIDE) 12.5 MG capsule Take 1 capsule (12.5 mg total) by mouth daily.  30 capsule  4  . hyoscyamine (LEVBID) 0.375 MG 12 hr tablet Take 0.375 mg by mouth as needed (cramping).       . metFORMIN (GLUCOPHAGE XR) 500 MG 24 hr tablet Take 2 tablets (1,000 mg total) by mouth 2 (two) times daily with a meal.  120 tablet  4  . promethazine (PHENERGAN) 25 MG tablet Take 1 tablet (25 mg total) by mouth every 6 (six) hours as needed for nausea.  20 tablet  0  .      .       No facility-administered medications prior to visit.   Past Medical History  Diagnosis Date  . Diverticulosis of colon with hemorrhage 2010, 09/2011  . Hypertension   . Personal history of colonic polyps     adenomas since 2010  . Diabetes mellitus   . Renal cell carcinoma 2004  . Blood transfusion   . IBS (irritable bowel syndrome)   . Hx: UTI (urinary tract infection)    Past Surgical History  Procedure Laterality Date  .  Nephrectomy      left  . Tubal ligation    . Knee arthroscopy      x2 left  . Hand surgery      x5 right  . Abdominal hysterectomy    . Cholecystectomy    . Upper gastrointestinal endoscopy  12/12/2008    gastroporesis  . Flexible sigmoidoscopy  12/12/2008    diverticulosis  . Colonoscopy  12/19/2008    diverticulosis  . Colonoscopy  11/17/2011    Procedure: COLONOSCOPY;  Surgeon: Erick Blinks, MD;  Location: [facility];  Service: Gastroenterology;  Laterality: N/A;   Review of Systems As above    Objective:   Physical Exam General:  NAD - obese Eyes:   anicteric Lungs:  clear Heart:  S1S2 no rubs, murmurs or gallops Abdomen:  Obese soft and mildly tender in LLQ, BS+ Rectal:  Brown stool, no mass, normal anoderm and tone - female staff present Ext:   no edema    Data Reviewed:  ED visit Labs Lab Results  Component Value Date   WBC 7.0 01/12/2013   HGB 12.2 01/12/2013   HCT 37.4 01/12/2013   MCV 86.0 01/12/2013   PLT 228 01/12/2013  Assessment & Plan:  Hematochezia -   LLQ pain  Diverticulosis of colon with hemorrhage in past ? Recurrent low grade  IBS (irritable bowel syndrome)   Benign neoplasm of colon  1. Dicyclomine 20 mg prn abdominal pain/IBS 2. Colonoscopy to evaluate bleeding and remove polyp(plan April) The risks and benefits as well as alternatives of endoscopic procedure(s) have been discussed and reviewed. All questions answered. The patient agrees to proceed. 3. Reassurance overall

## 2013-01-16 NOTE — Patient Instructions (Addendum)
You have been scheduled for a colonoscopy with propofol. Please follow written instructions given to you at your visit today.  Please use the suprep kit we have given you today. If you use inhalers (even only as needed), please bring them with you on the day of your procedure.  We have sent medications to your pharmacy for you to pick up at your convenience.   Thank you for choosing me and [facility].  Iva Boop, M.D., [facility]

## 2013-01-16 NOTE — Telephone Encounter (Signed)
LM to call and confirm pharmacy , [address] on [address] or [address] in [place] , [place].

## 2013-01-17 ENCOUNTER — Encounter: Payer: Self-pay | Admitting: Family Medicine

## 2013-02-20 ENCOUNTER — Telehealth: Payer: Self-pay | Admitting: Internal Medicine

## 2013-02-20 NOTE — Telephone Encounter (Signed)
Returned patient's call and she was wanting to know if she could take her diabetic medicine with applesauce.  I advised her not to take it with applesauce but she could have some jello or chicken broth.  She said she would take it with chicken broth.  I advised her to not take her diabetic medication tomorrow at all.  She agreed and will be here tomorrow at scheduled time.

## 2013-02-21 ENCOUNTER — Ambulatory Visit (AMBULATORY_SURGERY_CENTER): Payer: BC Managed Care – PPO | Admitting: Internal Medicine

## 2013-02-21 ENCOUNTER — Encounter: Payer: Self-pay | Admitting: Internal Medicine

## 2013-02-21 ENCOUNTER — Other Ambulatory Visit: Payer: Self-pay | Admitting: Internal Medicine

## 2013-02-21 VITALS — BP 142/69 | HR 83 | Temp 98.9°F | Resp 14 | Ht 60.0 in | Wt 238.0 lb

## 2013-02-21 DIAGNOSIS — K921 Melena: Secondary | ICD-10-CM

## 2013-02-21 DIAGNOSIS — K648 Other hemorrhoids: Secondary | ICD-10-CM

## 2013-02-21 DIAGNOSIS — Z86010 Personal history of colonic polyps: Secondary | ICD-10-CM

## 2013-02-21 DIAGNOSIS — D126 Benign neoplasm of colon, unspecified: Secondary | ICD-10-CM

## 2013-02-21 DIAGNOSIS — K5730 Diverticulosis of large intestine without perforation or abscess without bleeding: Secondary | ICD-10-CM

## 2013-02-21 LAB — GLUCOSE, CAPILLARY: Glucose-Capillary: 156 mg/dL — ABNORMAL HIGH (ref 70–99)

## 2013-02-21 MED ORDER — SODIUM CHLORIDE 0.9 % IV SOLN: 500.0000 mL | INTRAVENOUS | Status: DC

## 2013-02-21 NOTE — Progress Notes (Signed)
Patient did not experience any of the following events: a burn prior to discharge; a fall within the facility; wrong site/side/patient/procedure/implant event; or a hospital transfer or hospital admission upon discharge from the facility. (G8907) Patient did not have preoperative order for IV antibiotic SSI prophylaxis. (G8918)  

## 2013-02-21 NOTE — Progress Notes (Signed)
Called to room to assist during endoscopic procedure.  Patient ID and intended procedure confirmed with present staff. Received instructions for my participation in the procedure from the performing physician. ewm 

## 2013-02-21 NOTE — Patient Instructions (Addendum)
I found and removed two polyps. You still have diverticulosis and some hemorrhoids.  I will let you know pathology results and when to have another routine colonoscopy by mail.  Thank you for choosing me and [facility].  Iva Boop, MD, FACG   YOU HAD AN ENDOSCOPIC PROCEDURE TODAY AT THE [facility]: Refer to the procedure report that was given to you for any specific questions about what was found during the examination.  If the procedure report does not answer your questions, please call your gastroenterologist to clarify.  If you requested that your care partner not be given the details of your procedure findings, then the procedure report has been included in a sealed envelope for you to review at your convenience later.  YOU SHOULD EXPECT: Some feelings of bloating in the abdomen. Passage of more gas than usual.  Walking can help get rid of the air that was put into your GI tract during the procedure and reduce the bloating. If you had a lower endoscopy (such as a colonoscopy or flexible sigmoidoscopy) you may notice spotting of blood in your stool or on the toilet paper. If you underwent a bowel prep for your procedure, then you may not have a normal bowel movement for a few days.  DIET: Your first meal following the procedure should be a light meal and then it is ok to progress to your normal diet.  A half-sandwich or bowl of soup is an example of a good first meal.  Heavy or fried foods are harder to digest and may make you feel nauseous or bloated.  Likewise meals heavy in dairy and vegetables can cause extra gas to form and this can also increase the bloating.  Drink plenty of fluids but you should avoid alcoholic beverages for 24 hours.  ACTIVITY: Your care partner should take you home directly after the procedure.  You should plan to take it easy, moving slowly for the rest of the day.  You can resume normal activity the day after the procedure however you  should NOT DRIVE or use heavy machinery for 24 hours (because of the sedation medicines used during the test).    SYMPTOMS TO REPORT IMMEDIATELY: A gastroenterologist can be reached at any hour.  During normal business hours, 8:30 AM to 5:00 PM Monday through Friday, call [phone].  After hours and on weekends, please call the GI answering service at [phone] who will take a message and have the physician on call contact you.   Following lower endoscopy (colonoscopy or flexible sigmoidoscopy):  Excessive amounts of blood in the stool  Significant tenderness or worsening of abdominal pains  Swelling of the abdomen that is new, acute  Fever of 100F or higher FOLLOW UP: If any biopsies were taken you will be contacted by phone or by letter within the next 1-3 weeks.  Call your gastroenterologist if you have not heard about the biopsies in 3 weeks.  Our staff will call the home number listed on your records the next business day following your procedure to check on you and address any questions or concerns that you may have at that time regarding the information given to you following your procedure. This is a courtesy call and so if there is no answer at the home number and we have not heard from you through the emergency physician on call, we will assume that you have returned to your regular daily activities without incident.  SIGNATURES/CONFIDENTIALITY: You and/or  your care partner have signed paperwork which will be entered into your electronic medical record.  These signatures attest to the fact that that the information above on your After Visit Summary has been reviewed and is understood.  Full responsibility of the confidentiality of this discharge information lies with you and/or your care-partner.

## 2013-02-21 NOTE — Op Note (Signed)
[facility] [address] [place], [address]   COLONOSCOPY PROCEDURE REPORT  PATIENT: Benson, Monica  MR#: [id number] BIRTHDATE: 03-03-1956 , 56  yrs. old GENDER: Female ENDOSCOPIST: Iva Boop, MD, [facility] PROCEDURE DATE:  02/21/2013 PROCEDURE:   Colonoscopy with snare polypectomy ASA CLASS:   Class III INDICATIONS:Patient's personal history of adenomatous colon polyps and hematochezia. MEDICATIONS: propofol (Diprivan) 250mg  IV, MAC sedation, administered by CRNA, and These medications were titrated to patient response per physician's verbal order  DESCRIPTION OF PROCEDURE:   After the risks benefits and alternatives of the procedure were thoroughly explained, informed consent was obtained.  A digital rectal exam revealed no abnormalities of the rectum.   The LB [id number]  endoscope was introduced through the anus and advanced to the cecum, which was identified by both the appendix and ileocecal valve. No adverse events experienced.   The quality of the prep was Suprep good  The instrument was then slowly withdrawn as the colon was fully examined.     COLON FINDINGS: A sessile polyp measuring 8 mm in size was found in the ascending colon.  A polypectomy was performed with a cold snare.  The resection was complete and the polyp tissue was completely retrieved.   A sessile polyp measuring 5 mm in size was found in the sigmoid colon.  A polypectomy was performed with a cold snare.  The resection was complete and the polyp tissue was completely retrieved.   Severe diverticulosis was noted The finding was in the left colon.   Moderate diverticulosis was noted The finding was in the right colon.   Small internal hemorrhoids were found.   The colon mucosa was otherwise normal.  Retroflexed views revealed internal hemorrhoids. The time to cecum=4 minutes 30 seconds.  Withdrawal time=10 minutes 56 seconds.  The scope was withdrawn and the  procedure completed. COMPLICATIONS: There were no complications.  ENDOSCOPIC IMPRESSION: 1.   Sessile polyp measuring 8 mm in size was found in the ascending colon; polypectomy was performed with a cold snare 2.   Sessile polyp measuring 5 mm in size was found in the sigmoid colon; polypectomy was performed with a cold snare 3.   Severe diverticulosis was noted in the left colon 4.   Moderate diverticulosis was noted in the right colon 5.   Small internal hemorrhoids 6.   The colon mucosa was otherwise normal - good prep  RECOMMENDATIONS: Timing of repeat colonoscopy will be determined by pathology findings in patient with prior adenomas 2010  eSigned:  Iva Boop, MD, [facility] 02/21/2013 1:43 PM   cc: The Patient

## 2013-02-22 ENCOUNTER — Telehealth: Payer: Self-pay | Admitting: *Deleted

## 2013-02-22 NOTE — Telephone Encounter (Signed)
  Follow up Call-  Call back number 02/21/2013  Post procedure Call Back phone  # [phone]  Permission to leave phone message Yes     Patient questions:  Do you have a fever, pain , or abdominal swelling? no Pain Score  0 *  Have you tolerated food without any problems? yes  Have you been able to return to your normal activities? yes  Do you have any questions about your discharge instructions: Diet   no Medications  no Follow up visit  no  Do you have questions or concerns about your Care? no  Actions: * If pain score is 4 or above: No action needed, pain <4.

## 2013-02-26 ENCOUNTER — Encounter: Payer: Self-pay | Admitting: Internal Medicine

## 2013-02-26 NOTE — Progress Notes (Signed)
Quick Note:  Benign mucosa and inflammatory polyp Repeat colon 10 years 02/2023 ______

## 2013-03-13 ENCOUNTER — Encounter: Payer: Self-pay | Admitting: Family Medicine

## 2013-03-13 ENCOUNTER — Ambulatory Visit (INDEPENDENT_AMBULATORY_CARE_PROVIDER_SITE_OTHER): Payer: BC Managed Care – PPO | Admitting: Family Medicine

## 2013-03-13 VITALS — BP 138/78 | HR 102 | Temp 98.6°F | Ht 61.25 in | Wt 242.2 lb

## 2013-03-13 DIAGNOSIS — Z905 Acquired absence of kidney: Secondary | ICD-10-CM

## 2013-03-13 DIAGNOSIS — I10 Essential (primary) hypertension: Secondary | ICD-10-CM

## 2013-03-13 DIAGNOSIS — E118 Type 2 diabetes mellitus with unspecified complications: Secondary | ICD-10-CM

## 2013-03-13 LAB — BASIC METABOLIC PANEL
BUN: 16 mg/dL (ref 6–23)
CO2: 28 mEq/L (ref 19–32)
Calcium: 9.4 mg/dL (ref 8.4–10.5)
GFR: 77.49 mL/min (ref >60.00)
Glucose, Bld: 244 mg/dL — ABNORMAL HIGH (ref 70–99)
Sodium: 136 mEq/L (ref 135–145)

## 2013-03-13 LAB — CBC WITH DIFFERENTIAL/PLATELET
Basophils Relative: 0.6 % (ref 0.0–3.0)
Eosinophils Relative: 1.9 % (ref 0.0–5.0)
HCT: 38.7 % (ref 36.0–46.0)
Hemoglobin: 13 g/dL (ref 12.0–15.0)
Lymphs Abs: 2.3 10*3/uL (ref 0.7–4.0)
MCV: 84.1 fl (ref 78.0–100.0)
Monocytes Absolute: 0.5 10*3/uL (ref 0.1–1.0)
Monocytes Relative: 5.8 % (ref 3.0–12.0)
Neutro Abs: 5.1 10*3/uL (ref 1.4–7.7)
RBC: 4.6 Mil/uL (ref 3.87–5.11)
WBC: 8.1 10*3/uL (ref 4.5–10.5)

## 2013-03-13 LAB — HEMOGLOBIN A1C: Hgb A1c MFr Bld: 11.6 % — ABNORMAL HIGH (ref 4.6–6.5)

## 2013-03-13 LAB — HEPATIC FUNCTION PANEL
Albumin: 3.2 g/dL — ABNORMAL LOW (ref 3.5–5.2)
Alkaline Phosphatase: 98 U/L (ref 39–117)
Total Protein: 8.1 g/dL (ref 6.0–8.3)

## 2013-03-13 LAB — LIPID PANEL
HDL: 44.7 mg/dL (ref >39.00)
Total CHOL/HDL Ratio: 4
Triglycerides: 118 mg/dL (ref 0.0–149.0)

## 2013-03-13 LAB — TSH: TSH: 1.33 u[IU]/mL (ref 0.35–5.50)

## 2013-03-13 MED ORDER — GLUCOSE BLOOD VI STRP: ORAL_STRIP | Status: DC

## 2013-03-13 MED ORDER — LANCETS 30G MISC: Status: DC

## 2013-03-13 NOTE — Assessment & Plan Note (Signed)
New to provider.  Last documented A1C >13.  Pt has not been checking CBGs.  UTD on eye exam.  Asymptomatic.  Check labs and determine if current med regimen is appropriate.  Stressed need for low carb diet and regular exercise.  Will follow closely.

## 2013-03-13 NOTE — Assessment & Plan Note (Addendum)
Chronic problem.  Adequate control.  Has not been on ACE/ARB due to renal issues despite diabetes.  Will follow closely

## 2013-03-13 NOTE — Patient Instructions (Addendum)
Follow up in 3 months to recheck the diabetes We'll notify you of your lab results and make any med changes if needed Continue to follow low carb diet and attempt to get regular exercise Call with any questions or concerns Welcome!  We're glad to have you!!

## 2013-03-13 NOTE — Assessment & Plan Note (Signed)
New to provider.  Following w/ Dr Isabel Caprice.  Will attempt to maximize diabetes and BP control w/out using meds that will negatively impact Cr.  Will follow closely.

## 2013-03-13 NOTE — Progress Notes (Signed)
  Subjective:    Patient ID: [name], female    DOB: Nov 21, 1955, 57 y.o.   MRN: [id number]  HPI New to establish.  Previous MD- Dr Merri Brunette [facility])  DM- chronic problem, on Metformin 1000mg  and Glyburide 5 mg daily.  Has eye exam scheduled for June.  Was previously checking sugars but doesn't feel meter is reading correctly after 8 yrs.  Rare symptomatic lows.  No CP, SOB, HAs, visual changes, edema, N/V, numbness/tingling hands/feet.  HTN- chronic problem, on amlodipine and HCTZ daily.  Previously on ramipril but this caused increased Cr.  Currently asymptomatic.  Renal Cell Carcinoma- s/p L nephrectomy.  Following w/ Dr Isabel Caprice regularly.  Pt very concerned about medication impact on kidneys.   Review of Systems For ROS see HPI     Objective:   Physical Exam  Vitals reviewed. Constitutional: She is oriented to person, place, and time. She appears well-developed and well-nourished. No distress.  Morbidly obese  HENT:  Head: Normocephalic and atraumatic.  Eyes: Conjunctivae and EOM are normal. Pupils are equal, round, and reactive to light.  Neck: Normal range of motion. Neck supple. No thyromegaly present.  Cardiovascular: Normal rate, regular rhythm, normal heart sounds and intact distal pulses.   No murmur heard. Pulmonary/Chest: Effort normal and breath sounds normal. No respiratory distress.  Abdominal: Soft. She exhibits no distension. There is no tenderness.  Musculoskeletal: She exhibits no edema.  Lymphadenopathy:    She has no cervical adenopathy.  Neurological: She is alert and oriented to person, place, and time.  Skin: Skin is warm and dry.  Psychiatric: She has a normal mood and affect. Her behavior is normal.          Assessment & Plan:

## 2013-03-15 ENCOUNTER — Other Ambulatory Visit: Payer: Self-pay | Admitting: General Practice

## 2013-03-15 MED ORDER — GLYBURIDE 5 MG PO TABS: 10.0000 mg | ORAL_TABLET | Freq: Two times a day (BID) | ORAL | Status: DC

## 2013-03-19 ENCOUNTER — Telehealth: Payer: Self-pay | Admitting: *Deleted

## 2013-03-19 NOTE — Telephone Encounter (Signed)
Spoke with the pt and informed her of Dr. Rennis Golden recommendation below.  Pt stated she is willing to try the Victoza.  Informed her I will need to schedule her an appt to come in to discuss the med and the process.  Pt agreed.  Pt scheduled an appt with the nurse.//AB/CMA

## 2013-03-19 NOTE — Telephone Encounter (Signed)
Message copied by Verdie Shire on Tue Mar 19, 2013  4:00 PM ------      Message from: Sheliah Hatch      Created: Fri Mar 15, 2013  9:43 AM       If she is unwilling to take Januvia we have limited options.  The best option is Victoza once daily but this is an injxn.  We can schedule her an appt to show her the injxn and discuss the process.  It also helps w/ weight loss ------

## 2013-03-26 ENCOUNTER — Telehealth: Payer: Self-pay | Admitting: *Deleted

## 2013-03-26 ENCOUNTER — Ambulatory Visit (INDEPENDENT_AMBULATORY_CARE_PROVIDER_SITE_OTHER): Payer: BC Managed Care – PPO | Admitting: *Deleted

## 2013-03-26 DIAGNOSIS — I10 Essential (primary) hypertension: Secondary | ICD-10-CM

## 2013-03-26 DIAGNOSIS — E119 Type 2 diabetes mellitus without complications: Secondary | ICD-10-CM

## 2013-03-26 MED ORDER — HYDROCHLOROTHIAZIDE 12.5 MG PO CAPS: 12.5000 mg | ORAL_CAPSULE | Freq: Every day | ORAL | Status: DC

## 2013-03-26 NOTE — Patient Instructions (Signed)
Patient educated on Victoza administration and side effects. Patient advised to use at the same time everyday and adhere to healthy well balanced diet. Educated pt on dose titration if BS are not showing improvement after 1 week of use. Advised pt to begin at 0.6mg  (showed mark on pen) and continue therapy for 1 week. Patient is to increase dose to 1.2 mg on Tuesday 5/20 if no improvement in BS. After the second week if not improvement after week 2 may increase again to 1.8 mg daily. Patient was able to do a return demonstration on correct injection technique. Patient also advised of appropriate disposal of used needles. Sample provided with pen needles as well. Patient encouraged to call with questions.

## 2013-03-26 NOTE — Telephone Encounter (Signed)
Refill for HCTZ sent to [facility] per pt request. Pt states insurance needs all med refills sent to [facility]

## 2013-04-12 ENCOUNTER — Telehealth: Payer: Self-pay | Admitting: General Practice

## 2013-04-12 MED ORDER — LIRAGLUTIDE 18 MG/3ML ~~LOC~~ SOPN: 1.2000 mg | PEN_INJECTOR | Freq: Every day | SUBCUTANEOUS | Status: DC

## 2013-04-12 MED ORDER — INSULIN PEN NEEDLE 32G X 4 MM MISC: Status: DC

## 2013-04-12 NOTE — Telephone Encounter (Signed)
Med filled. Pt instructed to increase to 1.2mg  daily. LMOVM for pt to schedule a nurse visit to discuss diabetic education.

## 2013-04-17 ENCOUNTER — Ambulatory Visit (INDEPENDENT_AMBULATORY_CARE_PROVIDER_SITE_OTHER): Payer: BC Managed Care – PPO | Admitting: Family Medicine

## 2013-04-17 ENCOUNTER — Encounter: Payer: Self-pay | Admitting: Family Medicine

## 2013-04-17 VITALS — BP 144/80 | HR 86 | Temp 98.3°F | Ht 61.25 in | Wt 242.2 lb

## 2013-04-17 DIAGNOSIS — J209 Acute bronchitis, unspecified: Secondary | ICD-10-CM

## 2013-04-17 DIAGNOSIS — J019 Acute sinusitis, unspecified: Secondary | ICD-10-CM | POA: Insufficient documentation

## 2013-04-17 MED ORDER — PROMETHAZINE-DM 6.25-15 MG/5ML PO SYRP: 5.0000 mL | ORAL_SOLUTION | Freq: Four times a day (QID) | ORAL | Status: DC | PRN

## 2013-04-17 MED ORDER — AMOXICILLIN 875 MG PO TABS: 875.0000 mg | ORAL_TABLET | Freq: Two times a day (BID) | ORAL | Status: DC

## 2013-04-17 MED ORDER — IPRATROPIUM BROMIDE 0.02 % IN SOLN
0.5000 mg | Freq: Once | RESPIRATORY_TRACT | Status: AC
  Administered 2013-04-17: 0.5 mg via RESPIRATORY_TRACT

## 2013-04-17 MED ORDER — ALBUTEROL SULFATE (5 MG/ML) 0.5% IN NEBU
2.5000 mg | INHALATION_SOLUTION | Freq: Once | RESPIRATORY_TRACT | Status: AC
  Administered 2013-04-17: 2.5 mg via RESPIRATORY_TRACT

## 2013-04-17 MED ORDER — ALBUTEROL SULFATE HFA 108 (90 BASE) MCG/ACT IN AERS: 2.0000 | INHALATION_SPRAY | RESPIRATORY_TRACT | Status: DC | PRN

## 2013-04-17 NOTE — Assessment & Plan Note (Signed)
New.  Pt's sxs and PE consistent w/ infxn.  Start abx.  Reviewed supportive care and red flags that should prompt return.  Pt expressed understanding and is in agreement w/ plan.  

## 2013-04-17 NOTE — Progress Notes (Signed)
  Subjective:    Patient ID: [name], female    DOB: 02-Oct-1956, 57 y.o.   MRN: [id number]  HPI URI- sxs started ~1 week ago.  Worse in the afternoon and evening.  Cough- productive of white, yellow, and brown and gag inducing.  Denies runny nose.  + PND.  Previously had facial pain/pressure but this improved.  R ear pain but this improved.  + sweats and chills but no fever.  No N/V.  + sick contacts.  No hx of seasonal allergies.     Review of Systems For ROS see HPI     Objective:   Physical Exam  Vitals reviewed. Constitutional: She appears well-developed and well-nourished. No distress.  HENT:  Head: Normocephalic and atraumatic.  Right Ear: Tympanic membrane normal.  Left Ear: Tympanic membrane normal.  Nose: Mucosal edema and rhinorrhea present. Right sinus exhibits maxillary sinus tenderness and frontal sinus tenderness. Left sinus exhibits maxillary sinus tenderness and frontal sinus tenderness.  Mouth/Throat: Uvula is midline and mucous membranes are normal. Posterior oropharyngeal erythema present. No oropharyngeal exudate.  Eyes: Conjunctivae and EOM are normal. Pupils are equal, round, and reactive to light.  Neck: Normal range of motion. Neck supple.  Cardiovascular: Normal rate, regular rhythm and normal heart sounds.   Pulmonary/Chest: Effort normal. No respiratory distress. She has wheezes (diffuse expiratory wheezes, improved s/p neb tx).  Deep, wet cough  Lymphadenopathy:    She has no cervical adenopathy.          Assessment & Plan:

## 2013-04-17 NOTE — Assessment & Plan Note (Signed)
New.  Pt's wheezing improved s/p neb tx.  Prescription given for albuterol HFA.  Reviewed supportive care and red flags that should prompt return.  Pt expressed understanding and is in agreement w/ plan.

## 2013-04-17 NOTE — Patient Instructions (Addendum)
Start the Amox twice daily- take w/ food- for the sinus infection Use the cough syrup as needed- will cause drowsiness Use the inhaler as needed for chest tightness, wheezing, cough Drink plenty of fluids REST! Hang in there!

## 2013-04-18 ENCOUNTER — Ambulatory Visit (INDEPENDENT_AMBULATORY_CARE_PROVIDER_SITE_OTHER): Payer: BC Managed Care – PPO | Admitting: General Practice

## 2013-04-18 ENCOUNTER — Ambulatory Visit: Payer: BC Managed Care – PPO | Admitting: Family Medicine

## 2013-04-18 DIAGNOSIS — E118 Type 2 diabetes mellitus with unspecified complications: Secondary | ICD-10-CM

## 2013-04-18 NOTE — Patient Instructions (Signed)
Keep BS log for 1 week at new dose of 1.2mg . Call office in 1 week to notify of levels. Pt given reference material.

## 2013-04-18 NOTE — Progress Notes (Deleted)
Pt was advised to keep a BS log for 1 week at 1.2mg  and then notify office if sugar levels are still elevated. Pt was given reference material for diet control.

## 2013-05-31 ENCOUNTER — Encounter: Payer: Self-pay | Admitting: Family Medicine

## 2013-05-31 ENCOUNTER — Ambulatory Visit (INDEPENDENT_AMBULATORY_CARE_PROVIDER_SITE_OTHER): Payer: BC Managed Care – PPO | Admitting: Family Medicine

## 2013-05-31 VITALS — BP 138/90 | HR 99 | Temp 98.6°F | Ht 61.25 in | Wt 241.8 lb

## 2013-05-31 DIAGNOSIS — L732 Hidradenitis suppurativa: Secondary | ICD-10-CM

## 2013-05-31 MED ORDER — DOXYCYCLINE HYCLATE 100 MG PO TABS: 100.0000 mg | ORAL_TABLET | Freq: Two times a day (BID) | ORAL | Status: DC

## 2013-05-31 MED ORDER — METHYLPREDNISOLONE ACETATE 80 MG/ML IJ SUSP: 80.0000 mg | Freq: Once | INTRAMUSCULAR | Status: DC

## 2013-05-31 NOTE — Patient Instructions (Addendum)
Schedule your diabetes follow up visit in the next 2-3 weeks Keep area clean and dry Start the Doxy twice daily- take w/ food Call with any questions or concerns Hang in there!

## 2013-05-31 NOTE — Progress Notes (Signed)
  Subjective:    Patient ID: [name], female    DOB: February 11, 1956, 57 y.o.   MRN: [id number]  HPI Underarm infxn- sxs started 6 months ago.  R arm.  No similar sxs on L.  Intermittent drainage.  Pain.  Has tried 'home remedies' w/out success.  Drainage is almost constant x1 week.  + odor.   Review of Systems For ROS see HPI     Objective:   Physical Exam  Vitals reviewed. Constitutional: She appears well-developed and well-nourished. No distress.  Skin:  R axillary hidradenitis suppurativa w/ purulent drainage, odor and TTP.  No obvious abscess formation          Assessment & Plan:

## 2013-06-02 NOTE — Assessment & Plan Note (Signed)
New.  Purulent drainage was expressed using gentle but firm pressure.  No obvious abscess formation.  Start Doxy.  If sxs don't improve, pt will need surgical evaluation for definitive treatment.  Reviewed supportive care and red flags that should prompt return.  Pt expressed understanding and is in agreement w/ plan.

## 2013-06-05 LAB — HM DIABETES EYE EXAM

## 2013-06-14 ENCOUNTER — Encounter: Payer: Self-pay | Admitting: Family Medicine

## 2013-06-14 ENCOUNTER — Ambulatory Visit (INDEPENDENT_AMBULATORY_CARE_PROVIDER_SITE_OTHER): Payer: BC Managed Care – PPO | Admitting: Family Medicine

## 2013-06-14 VITALS — BP 140/90 | HR 93 | Temp 98.3°F | Ht 61.25 in | Wt 240.6 lb

## 2013-06-14 DIAGNOSIS — E118 Type 2 diabetes mellitus with unspecified complications: Secondary | ICD-10-CM

## 2013-06-14 LAB — BASIC METABOLIC PANEL
Calcium: 9.3 mg/dL (ref 8.4–10.5)
Chloride: 100 mEq/L (ref 96–112)
Creatinine, Ser: 1.1 mg/dL (ref 0.4–1.2)
Sodium: 134 mEq/L — ABNORMAL LOW (ref 135–145)

## 2013-06-14 LAB — MICROALBUMIN / CREATININE URINE RATIO: Microalb, Ur: 41.9 mg/dL — ABNORMAL HIGH (ref 0.0–1.9)

## 2013-06-14 LAB — HEMOGLOBIN A1C: Hgb A1c MFr Bld: 11.3 % — ABNORMAL HIGH (ref 4.6–6.5)

## 2013-06-14 MED ORDER — LIRAGLUTIDE 18 MG/3ML ~~LOC~~ SOPN: 1.2000 mg | PEN_INJECTOR | Freq: Every day | SUBCUTANEOUS | Status: DC

## 2013-06-14 NOTE — Patient Instructions (Signed)
Schedule your complete physical in 3 months We'll notify you of your lab results and make any changes Call with any questions or concerns Hang in there!!!

## 2013-06-14 NOTE — Progress Notes (Signed)
  Subjective:    Patient ID: [name], female    DOB: 1956-05-29, 57 y.o.   MRN: [id number]  HPI DM- chronic problem.  Did not take Victoza yesterday b/c she is out of meds.  Initially said it's b/c the 'pens won't work' but pens are actually empty and that's why they won't turn.  UTD on eye exam- + retinopathy, has appt w/ retinal specialist Aug 19.  No symptomatic lows.  No CP, SOB, HAs, N/V, edema.  Anxiety- daughter w/ stage 3 breast cancer and pt has depleted savings trying to help her and her kids.    Review of Systems For ROS see HPI     Objective:   Physical Exam  Vitals reviewed. Constitutional: She is oriented to person, place, and time. She appears well-developed and well-nourished. No distress.  HENT:  Head: Normocephalic and atraumatic.  Eyes: Conjunctivae and EOM are normal. Pupils are equal, round, and reactive to light.  Neck: Normal range of motion. Neck supple. No thyromegaly present.  Cardiovascular: Normal rate, regular rhythm, normal heart sounds and intact distal pulses.   No murmur heard. Pulmonary/Chest: Effort normal and breath sounds normal. No respiratory distress.  Abdominal: Soft. She exhibits no distension. There is no tenderness.  Musculoskeletal: She exhibits no edema.  Lymphadenopathy:    She has no cervical adenopathy.  Neurological: She is alert and oriented to person, place, and time.  Skin: Skin is warm and dry.  Psychiatric: She has a normal mood and affect. Her behavior is normal.          Assessment & Plan:

## 2013-06-15 NOTE — Assessment & Plan Note (Signed)
Pt continues to have very high sugars.  Showed her that her pens aren't broken- they're empty.  Script sent for new ones.  Now w/ retinopathy- has appt upcoming w/ specialist.  Limited oral options b/c of her solitary kidney.  Did not tolerate ACE in past.  Check UA and microalbumin.  Refer to nephrology if any protein present.  Discussed that if A1C remains high, she will need to see endo for better sugar control.  Pt expressed understanding and is in agreement w/ plan.

## 2013-06-21 ENCOUNTER — Telehealth: Payer: Self-pay | Admitting: *Deleted

## 2013-06-21 DIAGNOSIS — E1121 Type 2 diabetes mellitus with diabetic nephropathy: Secondary | ICD-10-CM

## 2013-06-21 DIAGNOSIS — E119 Type 2 diabetes mellitus without complications: Secondary | ICD-10-CM

## 2013-06-21 MED ORDER — LIRAGLUTIDE 18 MG/3ML ~~LOC~~ SOPN: 1.8000 mg | PEN_INJECTOR | Freq: Every day | SUBCUTANEOUS | Status: DC

## 2013-06-21 NOTE — Progress Notes (Signed)
Spoke with pt made aware of labs and increased dose of Victoza. Pt also made aware that referrals have been placed for Endo and Nephro and that they would call her when appts are set.

## 2013-06-21 NOTE — Telephone Encounter (Signed)
Referral placed to Endo and Nephro per Dr. Rennis Golden written order. Victoza dosage increased to 1.8mg  daily.

## 2013-07-07 ENCOUNTER — Emergency Department (HOSPITAL_COMMUNITY): Payer: BC Managed Care – PPO

## 2013-07-07 ENCOUNTER — Emergency Department (HOSPITAL_COMMUNITY)
Admission: EM | Admit: 2013-07-07 | Discharge: 2013-07-07 | Disposition: A | Discharge status: Home / Self Care | Payer: BC Managed Care – PPO | Attending: Emergency Medicine | Admitting: Emergency Medicine

## 2013-07-07 ENCOUNTER — Encounter (HOSPITAL_COMMUNITY): Payer: Self-pay | Admitting: Emergency Medicine

## 2013-07-07 DIAGNOSIS — I10 Essential (primary) hypertension: Secondary | ICD-10-CM | POA: Insufficient documentation

## 2013-07-07 DIAGNOSIS — Y93G1 Activity, food preparation and clean up: Secondary | ICD-10-CM | POA: Insufficient documentation

## 2013-07-07 DIAGNOSIS — Z79899 Other long term (current) drug therapy: Secondary | ICD-10-CM | POA: Insufficient documentation

## 2013-07-07 DIAGNOSIS — S61209A Unspecified open wound of unspecified finger without damage to nail, initial encounter: Secondary | ICD-10-CM | POA: Insufficient documentation

## 2013-07-07 DIAGNOSIS — Z794 Long term (current) use of insulin: Secondary | ICD-10-CM | POA: Insufficient documentation

## 2013-07-07 DIAGNOSIS — Y92009 Unspecified place in unspecified non-institutional (private) residence as the place of occurrence of the external cause: Secondary | ICD-10-CM | POA: Insufficient documentation

## 2013-07-07 DIAGNOSIS — IMO0002 Reserved for concepts with insufficient information to code with codable children: Secondary | ICD-10-CM

## 2013-07-07 DIAGNOSIS — E119 Type 2 diabetes mellitus without complications: Secondary | ICD-10-CM | POA: Insufficient documentation

## 2013-07-07 DIAGNOSIS — W260XXA Contact with knife, initial encounter: Secondary | ICD-10-CM | POA: Insufficient documentation

## 2013-07-07 MED ORDER — CEPHALEXIN 500 MG PO CAPS: 500.0000 mg | ORAL_CAPSULE | Freq: Four times a day (QID) | ORAL | Status: DC

## 2013-07-07 MED ORDER — BACITRACIN 500 UNIT/GM EX OINT
1.0000 "application " | TOPICAL_OINTMENT | Freq: Two times a day (BID) | CUTANEOUS | Status: DC
  Administered 2013-07-07: 1 via TOPICAL
  Filled 2013-07-07 (×2): qty 0.9

## 2013-07-07 MED ORDER — TETANUS-DIPHTH-ACELL PERTUSSIS 5-2.5-18.5 LF-MCG/0.5 IM SUSP
0.5000 mL | Freq: Once | INTRAMUSCULAR | Status: DC
  Filled 2013-07-07: qty 0.5

## 2013-07-07 NOTE — ED Provider Notes (Signed)
CSN: [id number]     Arrival date & time 07/07/13  1551 History  This chart was scribed for non-physician practitioner working with Lyanne Co, MD by Greggory Stallion, ED scribe. This patient was seen in room WTR7/WTR7 and the patient's care was started at 4:05 PM.    Chief Complaint  Patient presents with  . Extremity Laceration   The history is provided by the patient. No language interpreter was used.   HPI Comments: [name] is a 57 y.o. female who presents to the Emergency Department complaining of a left hand laceration with associated sharp finger pain that happened 45 minutes ago.  Pt was trying to separate frozen patties with a knife when the it slipped and cut her hand. Pain is worse with movement. She is right hand dominant. Pt is unsure when last tetanus was received. No meds PTA.    Past Medical History  Diagnosis Date  . Diverticulosis of colon with hemorrhage 2010, 09/2011  . Hypertension   . Personal history of colonic polyps     adenomas since 2010  . Diabetes mellitus   . Renal cell carcinoma 2004  . Blood transfusion   . IBS (irritable bowel syndrome)   . Hx: UTI (urinary tract infection)    Past Surgical History  Procedure Laterality Date  . Nephrectomy      left  . Tubal ligation    . Knee arthroscopy      x2 left  . Hand surgery      x5 right  . Abdominal hysterectomy    . Cholecystectomy    . Upper gastrointestinal endoscopy  12/12/2008    gastroporesis  . Flexible sigmoidoscopy  12/12/2008    diverticulosis  . Colonoscopy  12/19/2008    diverticulosis  . Colonoscopy  11/17/2011    Procedure: COLONOSCOPY;  Surgeon: Erick Blinks, MD;  Location: [facility];  Service: Gastroenterology;  Laterality: N/A;   Family History  Problem Relation Age of Onset  . Colon polyps Mother   . Diabetes Maternal Grandmother   . Diabetes Father   . Colon cancer Neg Hx    History  Substance Use Topics  . Smoking status: Never Smoker   . Smokeless tobacco:  Never Used  . Alcohol Use: No   OB History   Grav Para Term Preterm Abortions TAB SAB Ect Mult Living                 Review of Systems  Musculoskeletal: Positive for arthralgias.  Skin: Positive for wound.  All other systems reviewed and are negative.    Allergies  Review of patient's allergies indicates no known allergies.  Home Medications   Current Outpatient Rx  Name  Route  Sig  Dispense  Refill  . albuterol (PROVENTIL HFA;VENTOLIN HFA) 108 (90 BASE) MCG/ACT inhaler   Inhalation   Inhale 2 puffs into the lungs every 4 (four) hours as needed for wheezing or shortness of breath. Shortness of breath.   1 Inhaler   3   . glucose blood test strip      Use as instructed   100 each   12   . hydrochlorothiazide (MICROZIDE) 12.5 MG capsule   Oral   Take 1 capsule (12.5 mg total) by mouth daily.   30 capsule   4   . hyoscyamine (LEVBID) 0.375 MG 12 hr tablet   Oral   Take 0.375 mg by mouth as needed (cramping).          Marland Kitchen  Insulin Pen Needle 32G X 4 MM MISC      Please use one needle each time insulin is injected. Dx. 250.00   100 each   6   . Lancets 30G MISC      Use device as directed for testing   100 each   6   . Liraglutide (VICTOZA) 18 MG/3ML SOPN   Subcutaneous   Inject 1.8 mg into the skin daily.   3 mL   3   . traMADol (ULTRAM) 50 MG tablet   Oral   Take 50 mg by mouth every 6 (six) hours as needed for pain.          BP 175/87  Pulse 95  Temp(Src) 99.4 F (37.4 C) (Oral)  Resp 18  SpO2 98%  LMP 11/16/2002 Physical Exam  Nursing note and vitals reviewed. Constitutional: She is oriented to person, place, and time. She appears well-developed and well-nourished. No distress.  HENT:  Head: Normocephalic and atraumatic.  Right Ear: External ear normal.  Left Ear: External ear normal.  Nose: Nose normal.  Mouth/Throat: Oropharynx is clear and moist.  Eyes: Conjunctivae are normal.  Neck: Normal range of motion.  Cardiovascular:  Normal rate, regular rhythm and normal heart sounds.   Pulmonary/Chest: Effort normal and breath sounds normal. No stridor. No respiratory distress. She has no wheezes. She has no rales.  Abdominal: Soft. She exhibits no distension.  Musculoskeletal: Normal range of motion.  Strength in left 5th digit 5/5. Neurovascularly intact. Sensation intact. Cap refill < 3 seconds.   Neurological: She is alert and oriented to person, place, and time. She has normal strength.  Skin: Skin is warm and dry. She is not diaphoretic. No erythema.  2 cm laceration on palmar aspect of left 5th digit at MCP joint.  1 cm laceration on dorsal aspect of left 5th digit.  Psychiatric: She has a normal mood and affect. Her behavior is normal.    ED Course  Procedures (including critical care time) DIAGNOSTIC STUDIES: Oxygen Saturation is 98% on RA, normal by my interpretation.    COORDINATION OF CARE: 4:20 PM-Discussed treatment plan which includes XRAY, update tetanus, and laceration repair with pt at bedside and pt agreed to plan. Advised pt to follow-up with hand surgeon.  LACERATION REPAIR PROCEDURE NOTE The patient's identification was confirmed and consent was obtained. This procedure was performed by Junious Silk, PA-C at 4:32 PM. Site: palmar aspect of left fifth digit Sterile procedures observed Anesthetic used (type and amt):60mL 1% Lidocaine without epi Suture type/size: 5-0 ethilon Length: 2 cm # of Sutures: 3 Technique:simple interrupted Complexity simple  Antibx ointment applied  Tetanus ordered Site anesthetized, irrigated with NS, explored without evidence of foreign body, wound well approximated, site covered with dry, sterile dressing.  Patient tolerated procedure well without complications. Instructions for care discussed verbally and patient provided with additional written instructions for homecare and f/u.  LACERATION REPAIR PROCEDURE NOTE The patient's identification was  confirmed and consent was obtained. This procedure was performed by Junious Silk, PA-C at 4:51 PM. Site: dorsal aspect of left 5th digit Sterile procedures observed Anesthetic used (type and amt): 1mL 2% Lidocaine without epi  Suture type/size:5-0 ethilon Length:1 cm # of Sutures: 1 Technique:simple interrupted Complexity simple Antibx ointment applied Tetanus ordered Site anesthetized, irrigated with NS, explored without evidence of foreign body, wound well approximated, site covered with dry, sterile dressing.  Patient tolerated procedure well without complications. Instructions for care discussed verbally and patient provided with  additional written instructions for homecare and f/u.    Labs Reviewed - No data to display Dg Hand Complete Left  07/07/2013   *RADIOLOGY REPORT*  Clinical Data: Stab wound to the hand with laceration in the vicinity of the fifth metacarpal.  LEFT HAND - COMPLETE 3+ VIEW  Comparison: None.  Findings: No evidence of acute or subacute fracture or dislocation. Prior resection of the trapezium bone in the wrist, with dystrophic calcification adjacent to the base of the first metacarpal. Circumscribed cystic focus with sclerotic margins at the base of the second metacarpal.  Joint spaces relatively well preserved. Bone mineral density well preserved.  No opaque foreign bodies in the soft tissues.  IMPRESSION: No acute osseous abnormality.  No opaque foreign body in the soft tissues.   Original Report Authenticated By: Hulan Saas, M.D.   1. Laceration     MDM  Tdap booster refused by patient after looking at side effects. She will discussed possible tetanus booster with her primary doctor. Wound cleaning complete with pressure irrigation, bottom of wound visualized, no foreign bodies appreciated. Laceration occurred < 8 hours prior to repair which was well tolerated. She is diabetic. Will d/c with abx. Discussed suture home care w pt and answered questions.  Pt to f-u for wound check and suture removal in 7 days. Pt is hemodynamically stable w no complaints prior to dc. She will follow up with her hand surgeon.      I personally performed the services described in this documentation, which was scribed in my presence. The recorded information has been reviewed and is accurate.     Mora Bellman, PA-C 07/07/13 2051

## 2013-07-07 NOTE — ED Notes (Signed)
Pt was trying to pry frozen hamburger patties apart with a knife and accidentally cut her left pinky finger.

## 2013-07-08 ENCOUNTER — Emergency Department (HOSPITAL_BASED_OUTPATIENT_CLINIC_OR_DEPARTMENT_OTHER)
Admission: EM | Admit: 2013-07-08 | Discharge: 2013-07-08 | Disposition: A | Discharge status: Home / Self Care | Payer: BC Managed Care – PPO | Attending: Emergency Medicine | Admitting: Emergency Medicine

## 2013-07-08 ENCOUNTER — Encounter (HOSPITAL_BASED_OUTPATIENT_CLINIC_OR_DEPARTMENT_OTHER): Payer: Self-pay | Admitting: Emergency Medicine

## 2013-07-08 DIAGNOSIS — Z85528 Personal history of other malignant neoplasm of kidney: Secondary | ICD-10-CM | POA: Insufficient documentation

## 2013-07-08 DIAGNOSIS — Z860100 Personal history of colon polyps, unspecified: Secondary | ICD-10-CM | POA: Insufficient documentation

## 2013-07-08 DIAGNOSIS — Z4889 Encounter for other specified surgical aftercare: Secondary | ICD-10-CM | POA: Insufficient documentation

## 2013-07-08 DIAGNOSIS — Z87440 Personal history of urinary (tract) infections: Secondary | ICD-10-CM | POA: Insufficient documentation

## 2013-07-08 DIAGNOSIS — I10 Essential (primary) hypertension: Secondary | ICD-10-CM | POA: Insufficient documentation

## 2013-07-08 DIAGNOSIS — K589 Irritable bowel syndrome without diarrhea: Secondary | ICD-10-CM | POA: Insufficient documentation

## 2013-07-08 DIAGNOSIS — Z794 Long term (current) use of insulin: Secondary | ICD-10-CM | POA: Insufficient documentation

## 2013-07-08 DIAGNOSIS — Z79899 Other long term (current) drug therapy: Secondary | ICD-10-CM | POA: Insufficient documentation

## 2013-07-08 DIAGNOSIS — Z5189 Encounter for other specified aftercare: Secondary | ICD-10-CM | POA: Insufficient documentation

## 2013-07-08 DIAGNOSIS — Z23 Encounter for immunization: Secondary | ICD-10-CM | POA: Insufficient documentation

## 2013-07-08 DIAGNOSIS — S61219D Laceration without foreign body of unspecified finger without damage to nail, subsequent encounter: Secondary | ICD-10-CM

## 2013-07-08 DIAGNOSIS — Z8719 Personal history of other diseases of the digestive system: Secondary | ICD-10-CM | POA: Insufficient documentation

## 2013-07-08 MED ORDER — TETANUS-DIPHTH-ACELL PERTUSSIS 5-2.5-18.5 LF-MCG/0.5 IM SUSP
0.5000 mL | Freq: Once | INTRAMUSCULAR | Status: AC
Start: 2013-07-08 — End: 2013-07-08
  Administered 2013-07-08: 0.5 mL via INTRAMUSCULAR
  Filled 2013-07-08: qty 0.5

## 2013-07-08 NOTE — ED Notes (Signed)
D/c home with no new rx given 

## 2013-07-08 NOTE — ED Notes (Addendum)
Pt was working in her kitchen on Sunday afternoon when she accidentally stabbed self in the hand.  Pt went to [employer] on Sunday and received stitches.  Pt is in our ED b/c wound has reopened.  Pt presented with hand wrapped in papertowel, no bleeding.  Pt has a current appointment with [facility] on Wednesday 07/10/13 to get a Tetanus shot.  Gauze and wrap applied to laceration on L palm.

## 2013-07-08 NOTE — ED Provider Notes (Signed)
CSN: [id number]     Arrival date & time 07/08/13  2155 History   First MD Initiated Contact with Patient 07/08/13 2258     Chief Complaint  Patient presents with  . Wound Check   (Consider location/radiation/quality/duration/timing/severity/associated sxs/prior Treatment) Patient is a 57 y.o. female presenting with wound check. The history is provided by the patient and medical records.  Wound Check  Pt presents to the ED for wound check.  Pt received sutures yesterday at [facility] for laceration to left 5th digit with a kitchen knife.  Wound was suture and bandaged.  Pt states she changed the wound dressing today and thinks her stitches popped out.  No bleeding or purulent drainage from wound.  Pt declined tetanus at previous visit due to SE.  Pt is diabetic.  Pt is right hand dominant.  Past Medical History  Diagnosis Date  . Diverticulosis of colon with hemorrhage 2010, 09/2011  . Hypertension   . Personal history of colonic polyps     adenomas since 2010  . Diabetes mellitus   . Renal cell carcinoma 2004  . Blood transfusion   . IBS (irritable bowel syndrome)   . Hx: UTI (urinary tract infection)    Past Surgical History  Procedure Laterality Date  . Nephrectomy      left  . Tubal ligation    . Knee arthroscopy      x2 left  . Hand surgery      x5 right  . Abdominal hysterectomy    . Cholecystectomy    . Upper gastrointestinal endoscopy  12/12/2008    gastroporesis  . Flexible sigmoidoscopy  12/12/2008    diverticulosis  . Colonoscopy  12/19/2008    diverticulosis  . Colonoscopy  11/17/2011    Procedure: COLONOSCOPY;  Surgeon: Erick Blinks, MD;  Location: [facility];  Service: Gastroenterology;  Laterality: N/A;   Family History  Problem Relation Age of Onset  . Colon polyps Mother   . Diabetes Maternal Grandmother   . Diabetes Father   . Colon cancer Neg Hx    History  Substance Use Topics  . Smoking status: Never Smoker   . Smokeless tobacco: Never Used  . Alcohol  Use: No   OB History   Grav Para Term Preterm Abortions TAB SAB Ect Mult Living                 Review of Systems  Skin: Positive for wound.  All other systems reviewed and are negative.    Allergies  Review of patient's allergies indicates no known allergies.  Home Medications   Current Outpatient Rx  Name  Route  Sig  Dispense  Refill  . albuterol (PROVENTIL HFA;VENTOLIN HFA) 108 (90 BASE) MCG/ACT inhaler   Inhalation   Inhale 2 puffs into the lungs every 4 (four) hours as needed for wheezing or shortness of breath. Shortness of breath.   1 Inhaler   3   . cephALEXin (KEFLEX) 500 MG capsule   Oral   Take 1 capsule (500 mg total) by mouth 4 (four) times daily.   40 capsule   0   . glucose blood test strip      Use as instructed   100 each   12   . hydrochlorothiazide (MICROZIDE) 12.5 MG capsule   Oral   Take 1 capsule (12.5 mg total) by mouth daily.   30 capsule   4   . hyoscyamine (LEVBID) 0.375 MG 12 hr tablet   Oral  Take 0.375 mg by mouth as needed (cramping).          . Insulin Pen Needle 32G X 4 MM MISC      Please use one needle each time insulin is injected. Dx. 250.00   100 each   6   . Lancets 30G MISC      Use device as directed for testing   100 each   6   . Liraglutide (VICTOZA) 18 MG/3ML SOPN   Subcutaneous   Inject 1.8 mg into the skin daily.   3 mL   3   . traMADol (ULTRAM) 50 MG tablet   Oral   Take 50 mg by mouth every 6 (six) hours as needed for pain.          BP 179/88  Pulse 90  Temp(Src) 98.4 F (36.9 C) (Oral)  Resp 20  Ht 5\' 1"  (1.549 m)  Wt 238 lb (107.956 kg)  BMI 44.99 kg/m2  SpO2 97%  LMP 11/16/2002  Physical Exam  Nursing note and vitals reviewed. Constitutional: She is oriented to person, place, and time. She appears well-developed and well-nourished. No distress.  HENT:  Head: Normocephalic and atraumatic.  Eyes: Conjunctivae and EOM are normal. Pupils are equal, round, and reactive to light.    Neck: Normal range of motion. Neck supple.  Cardiovascular: Normal rate, regular rhythm and normal heart sounds.   Pulmonary/Chest: Effort normal and breath sounds normal. No respiratory distress. She has no wheezes.  Musculoskeletal: Normal range of motion.       Hands: 2cm laceration to palmer aspect of left 5th digit; 3 simple interrupted sutures in place with minimal dehiscence of wound; no active bleeding or purulent drainage; full ROM maintained, distal sensation intact  Neurological: She is alert and oriented to person, place, and time.  Skin: Skin is warm and dry. She is not diaphoretic.  Psychiatric: She has a normal mood and affect.    ED Course  Procedures (including critical care time) Labs Review Labs Reviewed - No data to display Imaging Review Dg Hand Complete Left  07/07/2013   *RADIOLOGY REPORT*  Clinical Data: Stab wound to the hand with laceration in the vicinity of the fifth metacarpal.  LEFT HAND - COMPLETE 3+ VIEW  Comparison: None.  Findings: No evidence of acute or subacute fracture or dislocation. Prior resection of the trapezium bone in the wrist, with dystrophic calcification adjacent to the base of the first metacarpal. Circumscribed cystic focus with sclerotic margins at the base of the second metacarpal.  Joint spaces relatively well preserved. Bone mineral density well preserved.  No opaque foreign bodies in the soft tissues.  IMPRESSION: No acute osseous abnormality.  No opaque foreign body in the soft tissues.   Original Report Authenticated By: Hulan Saas, M.D.    MDM   1. Laceration of finger, left, subsequent encounter    Sutures remain intact, minimal dehiscence-- do not think further repair is required.  Tetanus updated.  Instructed to FU with hand surgery in 1 week for re-check and suture removal. Continue taking abx as directed. Discussed plan with pt, they agreed.  Return precautions advised.   Garlon Hatchet, PA-C 07/08/13 2331  Garlon Hatchet, PA-C 07/08/13 [phone]

## 2013-07-08 NOTE — ED Provider Notes (Signed)
Medical screening examination/treatment/procedure(s) were performed by non-physician practitioner and as supervising physician I was immediately available for consultation/collaboration.   Glynn Octave, MD 07/08/13 [phone]

## 2013-07-09 NOTE — ED Provider Notes (Signed)
Medical screening examination/treatment/procedure(s) were performed by non-physician practitioner and as supervising physician I was immediately available for consultation/collaboration.   Lyanne Co, MD 07/09/13 2255

## 2013-07-10 ENCOUNTER — Ambulatory Visit (INDEPENDENT_AMBULATORY_CARE_PROVIDER_SITE_OTHER): Payer: BC Managed Care – PPO | Admitting: Family Medicine

## 2013-07-10 ENCOUNTER — Encounter: Payer: Self-pay | Admitting: Family Medicine

## 2013-07-10 VITALS — BP 150/86 | HR 96 | Temp 98.6°F | Ht 61.25 in | Wt 242.0 lb

## 2013-07-10 DIAGNOSIS — Z5189 Encounter for other specified aftercare: Secondary | ICD-10-CM

## 2013-07-10 DIAGNOSIS — S61412D Laceration without foreign body of left hand, subsequent encounter: Secondary | ICD-10-CM

## 2013-07-10 DIAGNOSIS — I10 Essential (primary) hypertension: Secondary | ICD-10-CM

## 2013-07-10 DIAGNOSIS — S61419A Laceration without foreign body of unspecified hand, initial encounter: Secondary | ICD-10-CM | POA: Insufficient documentation

## 2013-07-10 NOTE — Progress Notes (Signed)
  Subjective:    Patient ID: [name], female    DOB: 03-Dec-1955, 57 y.o.   MRN: [id number]  HPI Hand laceration- cut L hand on Sunday afternoon while preparing food.  Got stitches in ER- due to come out on Tuesday.  Started pt on Keflex but didn't want to take it b/c she read it can cause diarrhea.  Has script at home.  Instructions say 4x/day.  HTN- chronic problem, BP was very high in ER on Sunday but pt was worked up.  Was in [place] last week and admits to poor eating- ate out for every meal.  Had excessive swelling last week, this has improved.  No CP, SOB, HAs, visual changes.   Review of Systems For ROS see HPI     Objective:   Physical Exam  Vitals reviewed. Constitutional: She is oriented to person, place, and time. She appears well-developed and well-nourished. No distress.  HENT:  Head: Normocephalic and atraumatic.  Eyes: Conjunctivae and EOM are normal. Pupils are equal, round, and reactive to light.  Neck: Normal range of motion. Neck supple. No thyromegaly present.  Cardiovascular: Normal rate, regular rhythm, normal heart sounds and intact distal pulses.   No murmur heard. Pulmonary/Chest: Effort normal and breath sounds normal. No respiratory distress.  Abdominal: Soft. She exhibits no distension. There is no tenderness.  Musculoskeletal: She exhibits no edema.  Lymphadenopathy:    She has no cervical adenopathy.  Neurological: She is alert and oriented to person, place, and time.  Skin: Skin is warm and dry.  Psychiatric: She has a normal mood and affect. Her behavior is normal.          Assessment & Plan:

## 2013-07-10 NOTE — Patient Instructions (Addendum)
Take the keflex 3x/day for both the hand and under arm Keep a close eye on the BP- if still elevated when you see Dr Elvera Lennox, we'll have to make some changes Call with any questions or concerns Happy Labor Day!!!

## 2013-07-15 NOTE — Assessment & Plan Note (Addendum)
New to provider.  No current evidence of infxn but encouraged her to take Keflex.  Reviewed that all abx can cause diarrhea but keflex is one of the more mild ones.  Did tell pt she could take med 3x daily rather than 4x.  Pt agreeable to this.

## 2013-07-15 NOTE — Assessment & Plan Note (Signed)
Deteriorated.  Pt admits to poor eating while on vacation.  Will not adjust meds at this time but if BP is still elevated when she sees Endo in the next 2 weeks, will need to make med changes.  Pt expressed understanding and is in agreement w/ plan.

## 2013-07-16 ENCOUNTER — Encounter: Payer: Self-pay | Admitting: Family Medicine

## 2013-07-16 ENCOUNTER — Ambulatory Visit (INDEPENDENT_AMBULATORY_CARE_PROVIDER_SITE_OTHER): Payer: BC Managed Care – PPO | Admitting: Family Medicine

## 2013-07-16 VITALS — BP 148/88 | HR 88 | Temp 98.0°F | Wt 244.4 lb

## 2013-07-16 DIAGNOSIS — S61412D Laceration without foreign body of left hand, subsequent encounter: Secondary | ICD-10-CM

## 2013-07-16 DIAGNOSIS — Z5189 Encounter for other specified aftercare: Secondary | ICD-10-CM

## 2013-07-16 NOTE — Assessment & Plan Note (Signed)
Wound cleaned w/ H2O2 and 3 sutures removed easily.  Wound dressed w/ triple abx ointment and bandaid applied.  Pt tolerated procedure w/out difficulty.

## 2013-07-16 NOTE — Progress Notes (Signed)
  Subjective:    Patient ID: [name], female    DOB: July 13, 1956, 57 y.o.   MRN: [id number]  HPI Suture removal- L ring finger, 3 sutures.  No pain, no drainage, good ROM of finger at PIP, DIP, and MCP joint.   Review of Systems For ROS see HPI     Objective:   Physical Exam  Vitals reviewed. Constitutional: She appears well-developed and well-nourished. No distress.  Skin: Skin is warm and dry.  Well healed laceration of L 5th finger w/ 3 sutures present.  Full mobility/ROM of DIP, PIP, MCP joints.          Assessment & Plan:

## 2013-07-23 ENCOUNTER — Encounter: Payer: Self-pay | Admitting: Internal Medicine

## 2013-07-23 ENCOUNTER — Ambulatory Visit (INDEPENDENT_AMBULATORY_CARE_PROVIDER_SITE_OTHER): Payer: BC Managed Care – PPO | Admitting: Internal Medicine

## 2013-07-23 VITALS — BP 132/88 | HR 91 | Temp 98.9°F | Resp 12 | Ht 61.5 in | Wt 240.0 lb

## 2013-07-23 DIAGNOSIS — E118 Type 2 diabetes mellitus with unspecified complications: Secondary | ICD-10-CM

## 2013-07-23 MED ORDER — BROMOCRIPTINE MESYLATE 0.8 MG PO TABS: ORAL_TABLET | ORAL | Status: DC

## 2013-07-23 MED ORDER — METFORMIN HCL 500 MG PO TABS: 500.0000 mg | ORAL_TABLET | Freq: Two times a day (BID) | ORAL | Status: DC

## 2013-07-23 NOTE — Patient Instructions (Addendum)
Please return in 1 month with your sugar log.  Start Metformin 500 mg 2x a day (with breakfast and dinner). Start Cycloset 0.8 mg with breakfast and increase to 1.6 mg (2 tabs) after a week.  PATIENT INSTRUCTIONS FOR TYPE 2 DIABETES:  **Please join MyChart!** - see attached instructions about how to join   DIET AND EXERCISE Diet and exercise is an important part of diabetic treatment.  We recommended aerobic exercise in the form of brisk walking (working between 40-60% of maximal aerobic capacity, similar to brisk walking) for 150 minutes per week (such as 30 minutes five days per week) along with 3 times per week performing 'resistance' training (using various gauge rubber tubes with handles) 5-10 exercises involving the major muscle groups (upper body, lower body and core) performing 10-15 repetitions (or near fatigue) each exercise. Start at half the above goal but build slowly to reach the above goals. If limited by weight, joint pain, or disability, we recommend daily walking in a swimming pool with water up to waist to reduce pressure from joints while allow for adequate exercise.    BLOOD GLUCOSES Monitoring your blood glucoses is important for continued management of your diabetes. Please check your blood glucoses 2-4 times a day: fasting, before meals and at bedtime (you can rotate these measurements - e.g. one day check before the 3 meals, the next day check before 2 of the meals and before bedtime, etc.   HYPOGLYCEMIA (low blood sugar) Hypoglycemia is usually a reaction to not eating, exercising, or taking too much insulin/ other diabetes drugs.  Symptoms include tremors, sweating, hunger, confusion, headache, etc. Treat IMMEDIATELY with 15 grams of Carbs:   4 glucose tablets    cup regular juice/soda   2 tablespoons raisins   4 teaspoons sugar   1 tablespoon honey Recheck blood glucose in 15 mins and repeat above if still symptomatic/blood glucose <100. Please contact our office  at [phone] if you have questions about how to next handle your insulin.  RECOMMENDATIONS TO REDUCE YOUR RISK OF DIABETIC COMPLICATIONS: * Take your prescribed MEDICATION(S). * Follow a DIABETIC diet: Complex carbs, fiber rich foods, heart healthy fish twice weekly, (monounsaturated and polyunsaturated) fats * AVOID saturated/trans fats, high fat foods, >2,300 mg salt per day. * EXERCISE at least 5 times a week for 30 minutes or preferably daily.  * DO NOT SMOKE OR DRINK more than 1 drink a day. * Check your FEET every day. Do not wear tightfitting shoes. Contact us if you develop an ulcer * See your EYE doctor once a year or more if needed * Get a FLU shot once a year * Get a PNEUMONIA vaccine once before and once after age 19 years  GOALS:  * Your Hemoglobin A1c of <7%  * fasting sugars need to be <130 * after meals sugars need to be <180 (2h after you start eating) * Your Systolic BP should be 140 or lower  * Your Diastolic BP should be 80 or lower  * Your HDL (Good Cholesterol) should be 40 or higher  * Your LDL (Bad Cholesterol) should be 100 or lower  * Your Triglycerides should be 150 or lower  * Your Urine microalbumin (kidney function) should be <30 * Your Body Mass Index should be 25 or lower   We will be glad to help you achieve these goals. Our telephone number is: [phone].

## 2013-07-23 NOTE — Progress Notes (Signed)
Patient ID: [name], female   DOB: 07-13-1956, 57 y.o.   MRN: [id number]  HPI: [name] is a 57 y.o.-year-old female, referred by her PCP, Dr. Beverely Low, for management of DM2, non-insulin-dependent, uncontrolled, with complications (CKD, retinopathy).  Patient has been diagnosed with diabetes in 1995; she has not been on insulin before. Last hemoglobin A1c was: Lab Results  Component Value Date   HGBA1C 11.3* 06/14/2013    Pt is on a regimen of: - Victoza 1.8 mg daily - started 05/2013, increased on 06/21/2013. She has been on JanuMet, Metformin, Glyburide. She was on and off meds due to insurance - started to have insurance again on 02/2013.  All her meds are free.   Pt checks her sugars 1-2 a day and they are: - am: 150-250 - 1h after dinner: 180 No lows. Lowest sugar was 157; she has hypoglycemia awareness at 100. Highest sugar was 288.  Pt's meals are: - Breakfast: coffee (raw sugar) + egg + toast - Lunch: blackened salmon (baked) + string beans (no desert) - Dinner: baked chicken - free range/turkey - free range/ribeye + vegetables  - Snacks: graham crackers with PB, apples, cherry No juice or sodas.  At previous visits with PCPs, she admitted to poor eating, eating out every meal.  - Has CKD, last BUN/creatinine:  Lab Results  Component Value Date   BUN 22 06/14/2013   CREATININE 1.1 06/14/2013  She did not tolerate ACE inhibitors in the past.  - last set of lipids: Lab Results  Component Value Date   CHOL 165 03/13/2013   HDL 44.70 03/13/2013   LDLCALC 97 03/13/2013   TRIG 118.0 03/13/2013   CHOLHDL 4 03/13/2013  She is not on a statin.  - last eye exam was in 07/02/2013 (Dr Allyne Gee) >> +DR.  - no numbness and tingling in her feet.  I reviewed her chart and she also has a history of poorly controlled hypertension, history of renal cell carcinoma-status post nephrectomy, morbid obesity, IBS.  Pt does not have FH of DM.  ROS: Constitutional: no weight  gain/loss, no fatigue, no subjective hyperthermia/hypothermia Eyes: no blurry vision, no xerophthalmia ENT: no sore throat, no nodules palpated in throat, no dysphagia/odynophagia, no hoarseness Cardiovascular: no CP/SOB/palpitations/leg swelling Respiratory: no cough/SOB Gastrointestinal: no N/V/D/C Musculoskeletal: no muscle/joint aches Skin: no rashes Neurological: no tremors/numbness/tingling/dizziness Psychiatric: no depression/anxiety  Past Medical History  Diagnosis Date  . Diverticulosis of colon with hemorrhage 2010, 09/2011  . Hypertension   . Personal history of colonic polyps     adenomas since 2010  . Diabetes mellitus   . Renal cell carcinoma 2004  . Blood transfusion   . IBS (irritable bowel syndrome)   . Hx: UTI (urinary tract infection)   Menarche at 57 y/o. Menopause at 57 y/o.   Past Surgical History  Procedure Laterality Date  . Nephrectomy      left  . Tubal ligation    . Knee arthroscopy      x2 left  . Hand surgery      x5 right  . Abdominal hysterectomy    . Cholecystectomy    . Upper gastrointestinal endoscopy  12/12/2008    gastroporesis  . Flexible sigmoidoscopy  12/12/2008    diverticulosis  . Colonoscopy  12/19/2008    diverticulosis  . Colonoscopy  11/17/2011    Procedure: COLONOSCOPY;  Surgeon: Erick Blinks, MD;  Location: [facility];  Service: Gastroenterology;  Laterality: N/A;   History   Social History  .  Marital Status: Divorced    Spouse Name: N/A    Number of Children: 3   Occupational History  . Training and development officer    Social History Main Topics  . Smoking status: Never Smoker   . Smokeless tobacco: Never Used  . Alcohol Use: Yes  . Drug Use: No  . Sexual Activity: No   Social History Narrative   Regular exercise: walk 2 days, aerobics 3 days[facility]   Caffeine use: coffee every AM   Current Outpatient Prescriptions on File Prior to Visit  Medication Sig Dispense Refill  . albuterol (PROVENTIL HFA;VENTOLIN  HFA) 108 (90 BASE) MCG/ACT inhaler Inhale 2 puffs into the lungs every 4 (four) hours as needed for wheezing or shortness of breath. Shortness of breath.  1 Inhaler  3  . cephALEXin (KEFLEX) 500 MG capsule Take 1 capsule (500 mg total) by mouth 4 (four) times daily.  40 capsule  0  . glucose blood test strip Use as instructed  100 each  12  . hyoscyamine (LEVBID) 0.375 MG 12 hr tablet Take 0.375 mg by mouth as needed (cramping).       . Insulin Pen Needle 32G X 4 MM MISC Please use one needle each time insulin is injected. Dx. 250.00  100 each  6  . Lancets 30G MISC Use device as directed for testing  100 each  6  . Liraglutide (VICTOZA) 18 MG/3ML SOPN Inject 1.8 mg into the skin daily.  3 mL  3  . traMADol (ULTRAM) 50 MG tablet Take 50 mg by mouth every 6 (six) hours as needed for pain.      . hydrochlorothiazide (MICROZIDE) 12.5 MG capsule Take 1 capsule (12.5 mg total) by mouth daily.  30 capsule  4  . [DISCONTINUED] ramipril (ALTACE) 10 MG tablet Take 10 mg by mouth daily.       . [DISCONTINUED] sitaGLIPtan-metformin (JANUMET) 50-500 MG per tablet Take 1 tablet by mouth 2 (two) times daily with a meal.  60 tablet  0   No current facility-administered medications on file prior to visit.   No Known Allergies  Family History  Problem Relation Age of Onset  . Colon polyps Mother   . Diabetes Maternal Grandmother   . Diabetes Father   . Colon cancer Neg Hx    PE: BP 132/88  Pulse 91  Temp(Src) 98.9 F (37.2 C) (Oral)  Resp 12  Ht 5' 1.5" (1.562 m)  Wt 240 lb (108.863 kg)  BMI 44.62 kg/m2  SpO2 98%  LMP 11/16/2002 Wt Readings from Last 3 Encounters:  07/23/13 240 lb (108.863 kg)  07/16/13 244 lb 6.4 oz (110.859 kg)  07/10/13 242 lb (109.77 kg)   Constitutional: obese, in NAD Eyes: PERRLA, EOMI, no exophthalmos ENT: moist mucous membranes, no thyromegaly, no cervical lymphadenopathy Cardiovascular: RRR, No MRG Respiratory: CTA B Gastrointestinal: abdomen soft, NT, ND,  BS+ Musculoskeletal: no deformities, strength intact in all 4 Skin: moist, warm, acanthosis nigricans on neck Neurological: no tremor with outstretched hands, DTR normal in all 4  ASSESSMENT: 1. DM2, non-insulin-dependent, uncontrolled, without complications - diabetic nephropathy - DR  2. CKD - sees Dr Lacy Duverney - h/o Kidney cancer >> L kidney removed >> Dr Isabel Caprice  3. Obesity  PLAN:  1. Patient with long-standing, uncontrolled diabetes, only on Victoza for the last 2 months, which appears insufficient - We discussed about options for treatment, and I suggested to start basal insulin but she refuses. We could also try  to manage it with po meds if she agrees to try hard to lose weight. I suggested we try: - Metformin 500 mg bid >> will check with Dr Lacy Duverney if she can take this (I would like to increase the dose to 1000 mg bid, if possible) - Cycloset: start at 0.8 mg and advance to 1.6 mg daily (and will increase higher if pt tolerates it well). Advised it can cause dizziness - careful when stands from sitting position.  - continue Victoza 1.8 mg daily - Strongly advised her to start checking her sugars at different times of the day - check 2 times a day, rotating checks - given sugar log and advised how to fill it and to bring it at next appt  - given foot care handout and explained the principles  - given instructions for hypoglycemia management "15-15 rule"  - advised for yearly eye exams - if cannot lose weight, might need referral to nutrition for individualized plan. - Return to clinic in one month with sugar log

## 2013-07-31 ENCOUNTER — Ambulatory Visit (INDEPENDENT_AMBULATORY_CARE_PROVIDER_SITE_OTHER): Payer: BC Managed Care – PPO | Admitting: Family Medicine

## 2013-07-31 ENCOUNTER — Encounter: Payer: Self-pay | Admitting: Family Medicine

## 2013-07-31 VITALS — BP 126/82 | HR 86 | Temp 98.2°F | Wt 237.8 lb

## 2013-07-31 DIAGNOSIS — R1012 Left upper quadrant pain: Secondary | ICD-10-CM

## 2013-07-31 DIAGNOSIS — M549 Dorsalgia, unspecified: Secondary | ICD-10-CM | POA: Insufficient documentation

## 2013-07-31 LAB — HEPATIC FUNCTION PANEL
ALT: 18 U/L (ref 0–35)
AST: 23 U/L (ref 0–37)
Alkaline Phosphatase: 71 U/L (ref 39–117)
Bilirubin, Direct: 0 mg/dL (ref 0.0–0.3)
Total Protein: 7.7 g/dL (ref 6.0–8.3)

## 2013-07-31 LAB — BASIC METABOLIC PANEL
BUN: 33 mg/dL — ABNORMAL HIGH (ref 6–23)
CO2: 25 mEq/L (ref 19–32)
Chloride: 102 mEq/L (ref 96–112)
Creatinine, Ser: 1.5 mg/dL — ABNORMAL HIGH (ref 0.4–1.2)
Glucose, Bld: 93 mg/dL (ref 70–99)

## 2013-07-31 LAB — CBC WITH DIFFERENTIAL/PLATELET
Basophils Relative: 1.2 % (ref 0.0–3.0)
Eosinophils Absolute: 0.2 10*3/uL (ref 0.0–0.7)
Hemoglobin: 12.5 g/dL (ref 12.0–15.0)
Lymphocytes Relative: 38.5 % (ref 12.0–46.0)
MCHC: 33.2 g/dL (ref 30.0–36.0)
MCV: 84.3 fl (ref 78.0–100.0)
Neutro Abs: 4.3 10*3/uL (ref 1.4–7.7)
RBC: 4.48 Mil/uL (ref 3.87–5.11)

## 2013-07-31 LAB — H. PYLORI ANTIBODY, IGG: H Pylori IgG: NEGATIVE

## 2013-07-31 MED ORDER — TRAMADOL HCL 50 MG PO TABS: 50.0000 mg | ORAL_TABLET | Freq: Three times a day (TID) | ORAL | Status: DC | PRN

## 2013-07-31 MED ORDER — PANTOPRAZOLE SODIUM 40 MG PO TBEC: 40.0000 mg | DELAYED_RELEASE_TABLET | Freq: Every day | ORAL | Status: DC

## 2013-07-31 NOTE — Progress Notes (Signed)
  Subjective:    Patient ID: [name], female    DOB: [birth year], 57 y.o.   MRN: [id number]  HPI Back pain- intermittent x2 weeks, worse this AM.  L side, radiates around lower rib.  No heavy lifting, no change in activity.  Area is sore to touch.  Minimal pain w/ movement.  No rash.  Pain occurs w/ eating but was present this AM when woke up.  Nothing improves pain.   Review of Systems For ROS see HPI     Objective:   Physical Exam  Vitals reviewed. Constitutional: She is oriented to person, place, and time. She appears well-developed and well-nourished. No distress.  Abdominal: Soft. Bowel sounds are normal. She exhibits no distension and no mass. There is tenderness (over LUQ along lower ribs). There is no rebound and no guarding.  Musculoskeletal: She exhibits tenderness (TTP along L lower ribs radiation from back to front).  Neurological: She is alert and oriented to person, place, and time. She has normal reflexes. No cranial nerve deficit. Coordination normal.  Skin: Skin is warm and dry. No rash noted.          Assessment & Plan:

## 2013-07-31 NOTE — Patient Instructions (Addendum)
We'll notify you of your lab results and make any changes if needed Start the protonix for possible increased stomach acid Use the tramadol as needed for pain If no improvement, call me! Hang in there!!!

## 2013-08-05 ENCOUNTER — Encounter: Payer: Self-pay | Admitting: Family Medicine

## 2013-08-05 ENCOUNTER — Telehealth: Payer: Self-pay | Admitting: Internal Medicine

## 2013-08-05 NOTE — Assessment & Plan Note (Signed)
New.  Unable to determine if pain is radiating from back to front or LUQ to back.  Check labs as above.  Start tramadol for pain.  Will follow.

## 2013-08-05 NOTE — Assessment & Plan Note (Signed)
New.  Check labs and UA to r/o infxn, H pylori, electrolyte disturbance.  Pt w/out L kidney so not stone or other renal pathology.  Start tramadol for pain.  Will follow.

## 2013-08-06 ENCOUNTER — Encounter: Payer: Self-pay | Admitting: *Deleted

## 2013-08-06 NOTE — Telephone Encounter (Signed)
error 

## 2013-08-07 ENCOUNTER — Encounter: Payer: Self-pay | Admitting: Lab

## 2013-08-08 ENCOUNTER — Telehealth: Payer: Self-pay | Admitting: *Deleted

## 2013-08-08 ENCOUNTER — Encounter: Payer: BC Managed Care – PPO | Admitting: Family Medicine

## 2013-08-08 NOTE — Telephone Encounter (Signed)
Pt has an appt today (08/08/13). Pt stated that she wants to be started on insulin. Per Dr. Beverely Low that would be something for her to discuss with her endocrinologist. But if she would like to discuss anything else then she can be seen at that time.

## 2013-08-08 NOTE — Progress Notes (Signed)
This encounter was created in error - please disregard.

## 2013-08-09 ENCOUNTER — Ambulatory Visit (INDEPENDENT_AMBULATORY_CARE_PROVIDER_SITE_OTHER): Payer: BC Managed Care – PPO | Admitting: Internal Medicine

## 2013-08-09 ENCOUNTER — Encounter: Payer: Self-pay | Admitting: Internal Medicine

## 2013-08-09 VITALS — BP 126/84 | HR 69 | Temp 99.2°F | Resp 12 | Wt 236.0 lb

## 2013-08-09 DIAGNOSIS — E118 Type 2 diabetes mellitus with unspecified complications: Secondary | ICD-10-CM

## 2013-08-09 MED ORDER — INSULIN GLARGINE 100 UNIT/ML SOLOSTAR PEN: 15.0000 [IU] | PEN_INJECTOR | Freq: Every day | SUBCUTANEOUS | Status: DC

## 2013-08-09 MED ORDER — INSULIN PEN NEEDLE 32G X 4 MM MISC: Status: DC

## 2013-08-09 NOTE — Progress Notes (Signed)
Patient ID: [name], female   DOB: Jul 08, 1956, 57 y.o.   MRN: [id number]  HPI: [name] is a 57 y.o.-year-old female, returning for management of DM2, dx 1995, non-insulin-dependent, uncontrolled, with complications (CKD, retinopathy).  Last hemoglobin A1c was: Lab Results  Component Value Date   HGBA1C 11.3* 06/14/2013   We had to stop Metformin as she has a high creatinine and we stopped Victoza as she developed pancreatitis: AP and nausea and was on liquids for 2 weeks.   Now only on 1.6 mg Cycloset.  She was on a regimen of: - Metformin  - Cycloset (started last visit) - Victoza 1.8 mg daily - started 05/2013, increased on 06/21/2013. She has been on JanuMet, Metformin, Glyburide. She was on and off meds due to insurance - started to have insurance again on 02/2013.  All her meds are free.   Pt checks her sugars 3 a day and they are: - am: 150-250 >> 109-180 - lunch: 120s - before dinner: 120-130 - 1h after dinner: 180 >> not checking  No lows. Lowest sugar was 109; she has hypoglycemia awareness at 100.   Pt's meals are: - Breakfast: coffee (raw sugar) + egg + toast - Lunch: blackened salmon (baked) + string beans (no desert) - Dinner: baked chicken - free range/turkey - free range/ribeye + vegetables  - Snacks: graham crackers with PB, apples, cherry No juice or sodas.  At previous visits with PCPs, she admitted to poor eating, eating out every meal.  - Has CKD, last BUN/creatinine:  Lab Results  Component Value Date   BUN 33* 07/31/2013   CREATININE 1.5* 07/31/2013  She did not tolerate ACE inhibitors in the past.  - last set of lipids: Lab Results  Component Value Date   CHOL 165 03/13/2013   HDL 44.70 03/13/2013   LDLCALC 97 03/13/2013   TRIG 118.0 03/13/2013   CHOLHDL 4 03/13/2013  She is not on a statin.  - last eye exam was in 07/02/2013 (Dr Allyne Gee) >> +DR.  - no numbness and tingling in her feet.  I reviewed her chart and she also has a  history of poorly controlled hypertension, history of renal cell carcinoma-status post nephrectomy, morbid obesity, IBS.  ROS: Constitutional: no weight gain/loss, no fatigue, no subjective hyperthermia/hypothermia Eyes: no blurry vision, no xerophthalmia ENT: no sore throat, no nodules palpated in throat, no dysphagia/odynophagia, no hoarseness Cardiovascular: no CP/SOB/palpitations/leg swelling Respiratory: no cough/SOB Gastrointestinal: no N/V/D/C Musculoskeletal: no muscle/joint aches Skin: no rashes Neurological: no tremors/numbness/tingling/dizziness Psychiatric: no depression/anxiety  I reviewed pt's medications, allergies, PMH, social hx, family hx and no changes required, except as mentioned above >> stopped Metformin and Victoza.  PE: BP 126/84  Pulse 69  Temp(Src) 99.2 F (37.3 C) (Oral)  Resp 12  Wt 236 lb (107.049 kg)  BMI 43.88 kg/m2  SpO2 96%  LMP 11/16/2002 Wt Readings from Last 3 Encounters:  08/09/13 236 lb (107.049 kg)  07/31/13 237 lb 12.8 oz (107.865 kg)  07/23/13 240 lb (108.863 kg)   Constitutional: obese, in NAD Eyes: PERRLA, EOMI, no exophthalmos ENT: moist mucous membranes, no thyromegaly, no cervical lymphadenopathy Cardiovascular: RRR, No MRG Respiratory: CTA B Gastrointestinal: abdomen soft, NT, ND, BS+ Musculoskeletal: no deformities, strength intact in all 4 Skin: moist, warm, acanthosis nigricans on neck Neurological: no tremor with outstretched hands, DTR normal in all 4  ASSESSMENT: 1. DM2, non-insulin-dependent, uncontrolled, without complications - diabetic nephropathy - DR  2. CKD - sees Dr Lacy Duverney - h/o Kidney cancer >>  L kidney removed >> Dr Isabel Caprice  3. Obesity  PLAN:  1. Patient with long-standing, uncontrolled diabetes, with recent pancreatitis episode, with persistent abd. (Stopped Victoza). Also with worsened CKD (stopped Metformin) - We discussed about options for treatment, and I suggested to start basal insulin and  she accepts this now.  I suggested we try: - Cycloset: continue 1.6 mg daily (and will increase higher if pt tolerates it well and sugars are high). No dizziness. - start Lantus 15 units at night - advised proper inj. Techniques, demonstrated pen use - continue checking her sugars at different times of the day - check 2 times a day, rotating checks - given sugar log and advised how to fill it and to bring it at next appt  - given foot care handout and explained the principles  - given instructions for hypoglycemia management "15-15 rule"  - advised for yearly eye exams - if cannot lose weight, might need referral to nutrition for individualized plan. - Return to clinic in one month with sugar log

## 2013-08-09 NOTE — Patient Instructions (Signed)
Start Lantus 15 units at bedtime. Continue Cycloset at 1.6 mg daily for now. Please call me in a week with your sugars.

## 2013-08-16 ENCOUNTER — Encounter (HOSPITAL_COMMUNITY): Payer: Self-pay | Admitting: Emergency Medicine

## 2013-08-16 ENCOUNTER — Emergency Department (HOSPITAL_COMMUNITY)
Admission: EM | Admit: 2013-08-16 | Discharge: 2013-08-16 | Disposition: A | Discharge status: Home / Self Care | Payer: BC Managed Care – PPO | Attending: Emergency Medicine | Admitting: Emergency Medicine

## 2013-08-16 ENCOUNTER — Emergency Department (HOSPITAL_COMMUNITY): Payer: BC Managed Care – PPO

## 2013-08-16 ENCOUNTER — Telehealth: Payer: Self-pay | Admitting: *Deleted

## 2013-08-16 DIAGNOSIS — R109 Unspecified abdominal pain: Secondary | ICD-10-CM | POA: Diagnosis present

## 2013-08-16 DIAGNOSIS — E119 Type 2 diabetes mellitus without complications: Secondary | ICD-10-CM | POA: Insufficient documentation

## 2013-08-16 DIAGNOSIS — Z860100 Personal history of colon polyps, unspecified: Secondary | ICD-10-CM | POA: Insufficient documentation

## 2013-08-16 DIAGNOSIS — Z794 Long term (current) use of insulin: Secondary | ICD-10-CM | POA: Insufficient documentation

## 2013-08-16 DIAGNOSIS — Z87440 Personal history of urinary (tract) infections: Secondary | ICD-10-CM | POA: Insufficient documentation

## 2013-08-16 DIAGNOSIS — Z8719 Personal history of other diseases of the digestive system: Secondary | ICD-10-CM | POA: Insufficient documentation

## 2013-08-16 DIAGNOSIS — I10 Essential (primary) hypertension: Secondary | ICD-10-CM | POA: Insufficient documentation

## 2013-08-16 DIAGNOSIS — R1012 Left upper quadrant pain: Secondary | ICD-10-CM | POA: Insufficient documentation

## 2013-08-16 DIAGNOSIS — Z79899 Other long term (current) drug therapy: Secondary | ICD-10-CM | POA: Insufficient documentation

## 2013-08-16 LAB — CBC WITH DIFFERENTIAL/PLATELET
Basophils Absolute: 0 10*3/uL (ref 0.0–0.1)
Basophils Relative: 0 % (ref 0–1)
Eosinophils Absolute: 0.2 10*3/uL (ref 0.0–0.7)
HCT: 41.1 % (ref 36.0–46.0)
MCHC: 32.6 g/dL (ref 30.0–36.0)
Monocytes Absolute: 0.4 10*3/uL (ref 0.1–1.0)
Neutro Abs: 3.3 10*3/uL (ref 1.7–7.7)
Neutrophils Relative %: 47 % (ref 43–77)
Platelets: 224 10*3/uL (ref 150–400)
RDW: 14.1 % (ref 11.5–15.5)

## 2013-08-16 LAB — URINALYSIS W MICROSCOPIC + REFLEX CULTURE
Glucose, UA: NEGATIVE mg/dL
Hgb urine dipstick: NEGATIVE
Ketones, ur: NEGATIVE mg/dL
Nitrite: NEGATIVE
Protein, ur: NEGATIVE mg/dL

## 2013-08-16 LAB — COMPREHENSIVE METABOLIC PANEL
AST: 21 U/L (ref 0–37)
Albumin: 3.1 g/dL — ABNORMAL LOW (ref 3.5–5.2)
Alkaline Phosphatase: 96 U/L (ref 39–117)
CO2: 26 mEq/L (ref 19–32)
Chloride: 102 mEq/L (ref 96–112)
Creatinine, Ser: 0.98 mg/dL (ref 0.50–1.10)
GFR calc non Af Amer: 64 mL/min — ABNORMAL LOW (ref >90)
Sodium: 139 mEq/L (ref 135–145)
Total Bilirubin: 0.2 mg/dL — ABNORMAL LOW (ref 0.3–1.2)
Total Protein: 8.1 g/dL (ref 6.0–8.3)

## 2013-08-16 LAB — LIPASE, BLOOD: Lipase: 73 U/L — ABNORMAL HIGH (ref 11–59)

## 2013-08-16 MED ORDER — SODIUM CHLORIDE 0.9 % IV BOLUS (SEPSIS)
1000.0000 mL | INTRAVENOUS | Status: AC
  Administered 2013-08-16: 1000 mL via INTRAVENOUS

## 2013-08-16 MED ORDER — HYDROMORPHONE HCL PF 1 MG/ML IJ SOLN
1.0000 mg | INTRAMUSCULAR | Status: AC
  Administered 2013-08-16: 1 mg via INTRAVENOUS
  Filled 2013-08-16: qty 1

## 2013-08-16 MED ORDER — ONDANSETRON HCL 4 MG/2ML IJ SOLN
4.0000 mg | Freq: Once | INTRAMUSCULAR | Status: AC
  Administered 2013-08-16: 4 mg via INTRAVENOUS
  Filled 2013-08-16: qty 2

## 2013-08-16 MED ORDER — OXYCODONE-ACETAMINOPHEN 5-325 MG PO TABS: 1.0000 | ORAL_TABLET | ORAL | Status: DC | PRN

## 2013-08-16 MED ORDER — GI COCKTAIL ~~LOC~~
30.0000 mL | Freq: Once | ORAL | Status: AC
  Administered 2013-08-16: 30 mL via ORAL
  Filled 2013-08-16: qty 30

## 2013-08-16 MED ORDER — IOHEXOL 300 MG/ML  SOLN
100.0000 mL | Freq: Once | INTRAMUSCULAR | Status: AC | PRN
  Administered 2013-08-16: 100 mL via INTRAVENOUS

## 2013-08-16 MED ORDER — RANITIDINE HCL 150 MG PO CAPS: 150.0000 mg | ORAL_CAPSULE | Freq: Every day | ORAL | Status: DC

## 2013-08-16 NOTE — ED Notes (Signed)
Harrison, MD at bedside. 

## 2013-08-16 NOTE — ED Notes (Signed)
Pt walked to radiology with techs.

## 2013-08-16 NOTE — Telephone Encounter (Signed)
Called pt, phone was busy will call back.

## 2013-08-16 NOTE — Telephone Encounter (Signed)
Patient called and stated that she is not feeling well and that she can not keeping taking the tramadrol because it affects her stomach. Patient was advised that because we do not have pain medications here at the office, if she is hurting that bad then she would need to be seen at the emergency room. Patient understood and stated that she will go to the ER. SW, CMA

## 2013-08-16 NOTE — Telephone Encounter (Signed)
If pt is seeking pain meds immediately, we do not have those in office and would need OV to be evaluated prior to prescribing meds.  If pt doesn't go to ER, needs OV here.

## 2013-08-16 NOTE — ED Notes (Addendum)
Per patient, has been having left upper quadrant pain for a month-saw PCP and was diagnosed with pancreatitis-states it was related to Victoza-stopped taking med but pain still occuring

## 2013-08-16 NOTE — Telephone Encounter (Signed)
Pt seen in ED today

## 2013-08-16 NOTE — Telephone Encounter (Signed)
Called and LM for pt

## 2013-08-16 NOTE — ED Provider Notes (Signed)
CSN: [id number]     Arrival date & time 08/16/13  1021 History   First MD Initiated Contact with Patient 08/16/13 1118     Chief Complaint  Patient presents with  . Pancreatitis   (Consider location/radiation/quality/duration/timing/severity/associated sxs/prior Treatment) Patient is a 57 y.o. female presenting with abdominal pain. The history is provided by the patient.  Abdominal Pain Pain location:  LUQ Pain quality: burning   Pain radiates to:  Back Pain severity:  Moderate Onset quality:  Gradual Duration:  1 day Timing:  Constant Progression:  Unchanged Chronicity:  New Relieved by:  Nothing Worsened by:  Nothing tried Associated symptoms: no chest pain, no cough, no diarrhea, no dysuria, no fatigue, no fever, no hematuria, no nausea, no shortness of breath and no vomiting     Past Medical History  Diagnosis Date  . Diverticulosis of colon with hemorrhage 2010, 09/2011  . Hypertension   . Personal history of colonic polyps     adenomas since 2010  . Diabetes mellitus   . Renal cell carcinoma 2004  . Blood transfusion   . IBS (irritable bowel syndrome)   . Hx: UTI (urinary tract infection)    Past Surgical History  Procedure Laterality Date  . Nephrectomy      left  . Tubal ligation    . Knee arthroscopy      x2 left  . Hand surgery      x5 right  . Abdominal hysterectomy    . Cholecystectomy    . Upper gastrointestinal endoscopy  12/12/2008    gastroporesis  . Flexible sigmoidoscopy  12/12/2008    diverticulosis  . Colonoscopy  12/19/2008    diverticulosis  . Colonoscopy  11/17/2011    Procedure: COLONOSCOPY;  Surgeon: Erick Blinks, MD;  Location: [facility];  Service: Gastroenterology;  Laterality: N/A;   Family History  Problem Relation Age of Onset  . Colon polyps Mother   . Diabetes Maternal Grandmother   . Diabetes Father   . Colon cancer Neg Hx    History  Substance Use Topics  . Smoking status: Never Smoker   . Smokeless tobacco: Never Used  .  Alcohol Use: Yes   OB History   Grav Para Term Preterm Abortions TAB SAB Ect Mult Living                 Review of Systems  Constitutional: Negative for fever and fatigue.  HENT: Negative for congestion, drooling and neck pain.   Eyes: Negative for pain.  Respiratory: Negative for cough and shortness of breath.   Cardiovascular: Negative for chest pain.  Gastrointestinal: Positive for abdominal pain. Negative for nausea, vomiting and diarrhea.  Genitourinary: Negative for dysuria and hematuria.  Musculoskeletal: Negative for back pain and gait problem.  Skin: Negative for color change.  Neurological: Negative for dizziness and headaches.  Hematological: Negative for adenopathy.  Psychiatric/Behavioral: Negative for behavioral problems.  All other systems reviewed and are negative.    Allergies  Review of patient's allergies indicates no known allergies.  Home Medications   Current Outpatient Rx  Name  Route  Sig  Dispense  Refill  . albuterol (PROVENTIL HFA;VENTOLIN HFA) 108 (90 BASE) MCG/ACT inhaler   Inhalation   Inhale 2 puffs into the lungs every 4 (four) hours as needed for wheezing or shortness of breath. Shortness of breath.   1 Inhaler   3   . Bromocriptine Mesylate 0.8 MG TABS      Take 1.6 mg daily in am  with breakfast   60 tablet   3   . hydrochlorothiazide (MICROZIDE) 12.5 MG capsule   Oral   Take 1 capsule (12.5 mg total) by mouth daily.   30 capsule   4   . Insulin Glargine (LANTUS SOLOSTAR) 100 UNIT/ML SOPN   Subcutaneous   Inject 15 Units into the skin at bedtime.   5 pen   PRN    BP 152/87  Pulse 74  Temp(Src) 98.6 F (37 C) (Oral)  Resp 18  SpO2 100%  LMP 11/16/2002 Physical Exam  Nursing note and vitals reviewed. Constitutional: She is oriented to person, place, and time. She appears well-developed and well-nourished.  HENT:  Head: Normocephalic.  Mouth/Throat: Oropharynx is clear and moist. No oropharyngeal exudate.  Eyes:  Conjunctivae and EOM are normal. Pupils are equal, round, and reactive to light.  Neck: Normal range of motion. Neck supple.  Cardiovascular: Normal rate, regular rhythm, normal heart sounds and intact distal pulses.  Exam reveals no gallop and no friction rub.   No murmur heard. Pulmonary/Chest: Effort normal and breath sounds normal. No respiratory distress. She has no wheezes. She exhibits tenderness (ttp of anterior left lower rib margin).  Abdominal: Soft. Bowel sounds are normal. There is no tenderness. There is no rebound and no guarding.  Musculoskeletal: Normal range of motion. She exhibits no edema and no tenderness.  Neurological: She is alert and oriented to person, place, and time.  Skin: Skin is warm and dry.  Psychiatric: She has a normal mood and affect. Her behavior is normal.    ED Course  Procedures (including critical care time) Labs Review Labs Reviewed  COMPREHENSIVE METABOLIC PANEL - Abnormal; Notable for the following:    Glucose, Bld 173 (*)    Albumin 3.1 (*)    Total Bilirubin 0.2 (*)    GFR calc non Af Amer 64 (*)    GFR calc Af Amer 74 (*)    All other components within normal limits  LIPASE, BLOOD - Abnormal; Notable for the following:    Lipase 73 (*)    All other components within normal limits  URINALYSIS W MICROSCOPIC + REFLEX CULTURE - Abnormal; Notable for the following:    APPearance CLOUDY (*)    Leukocytes, UA TRACE (*)    Bacteria, UA MANY (*)    Squamous Epithelial / LPF FEW (*)    All other components within normal limits  CBC WITH DIFFERENTIAL   Imaging Review Ct Abdomen Pelvis W Contrast  08/16/2013   CLINICAL DATA:  New left-sided back and abdominal pain, diabetes, began new medication (Lantus) recently, past history of renal cell carcinoma post left nephrectomy, hypertension, irritable bowel syndrome  EXAM: CT ABDOMEN AND PELVIS WITH CONTRAST  TECHNIQUE: Multidetector CT imaging of the abdomen and pelvis was performed using the  standard protocol following bolus administration of intravenous contrast. Sagittal and coronal MPR images reconstructed from axial data set.  CONTRAST:  OMNIPAQUE IOHEXOL 300 MG/ML SOLN No oral contrast administered.  COMPARISON:  12/29/2010  FINDINGS: Lung bases clear.  Tiny right renal cyst anteriorly.  Gallbladder surgically absent.  Surgical absence of left kidney without evidence of tumor recurrence in left renal fossa.  Liver, spleen, pancreas, right kidney, and adrenal glands otherwise normal.  Specifically, no CT evidence of acute pancreatitis.  Multiple ventral abdominal wall fashion defects with small fat containing hernias.  Unremarkable bladder and right ureter.  Uterus surgically absent with nonvisualization of ovaries.  Scattered diverticulosis throughout colon.  Normal  appendix.  Stomach and bowel loops otherwise normal appearance.  No mass, adenopathy, free fluid, or inflammatory process.  New 15 x 10 x 13 mm cystic versus lytic focus superior to the right acetabulum anteriorly, well-circumscribed without cortical thinning.  Patient does demonstrate hip joint space narrowing bilaterally.  Small bone island superior to left acetabulum unchanged.  No additional osseous lesions.  IMPRESSION: No CT evidence of acute pancreatitis.  Post left nephrectomy.  Extensive colonic diverticulosis without evidence of diverticulitis.  Multiple small ventral hernias containing fat.  New 15 x 10 x 13 mm cystic versus lytic focus superior to the right acetabulum.  As this is well-circumscribed with a well-defined sclerotic rim and no cortical thinning, located adjacent to the right hip joint which has mild degenerative changes, I favor this being a benign process such as a subchondral cyst.  However, in patient with a history of renal cell carcinoma, lytic metastasis is not entirely excluded.  Either assessment by MR imaging to exclude metastasis or followup CT imaging recommended to assess stability.    Electronically Signed   By: Ulyses Southward M.D.   On: 08/16/2013 14:22    MDM   1. Abdominal pain    12:01 PM 57 y.o. female who presents with intermittent left upper quadrant pain for approximately one month. The patient was diagnosed with mild pancreatitis by her primary care physician. She notes that she has had mild worsening of her symptoms yesterday despite being on a liquid diet. She denies any vomiting, diarrhea, or fever. She does note some nausea. She is afebrile and vital signs are unremarkable here. Her tenderness seems to be at the left lower anterior ribs, under her left breast. She notes the pain is a burning pain and is constant. It is reproducible with palpation. Will get screening labwork, IV fluid, pain control.  3:23 PM: I interpreted/reviewed the labs and/or imaging. Notified pt of lesion adjacent to right hip noted on CT. Will have her f/u w/ her pcp for more detailed imaging. No obvious cause of her abd/chest wall pain.  I have discussed the diagnosis/risks/treatment options with the patient and believe the pt to be eligible for discharge home to follow-up with pcp. We also discussed returning to the ED immediately if new or worsening sx occur. We discussed the sx which are most concerning (e.g., worsening pain, fever) that necessitate immediate return. Any new prescriptions provided to the patient are listed below.  Discharge Medication List as of 08/16/2013  3:26 PM    START taking these medications   Details  oxyCODONE-acetaminophen (PERCOCET) 5-325 MG per tablet Take 1 tablet by mouth every 4 (four) hours as needed for pain., Starting 08/16/2013, Until Discontinued, Print    ranitidine (ZANTAC) 150 MG capsule Take 1 capsule (150 mg total) by mouth daily., Starting 08/16/2013, Until Discontinued, Print          Junius Argyle, MD 08/16/13 1945

## 2013-08-20 ENCOUNTER — Telehealth: Payer: Self-pay | Admitting: *Deleted

## 2013-08-20 ENCOUNTER — Encounter: Payer: Self-pay | Admitting: Family Medicine

## 2013-08-20 ENCOUNTER — Ambulatory Visit (INDEPENDENT_AMBULATORY_CARE_PROVIDER_SITE_OTHER): Payer: BC Managed Care – PPO | Admitting: Family Medicine

## 2013-08-20 VITALS — BP 130/82 | HR 73 | Temp 98.2°F | Resp 16 | Wt 236.0 lb

## 2013-08-20 DIAGNOSIS — R8281 Pyuria: Secondary | ICD-10-CM

## 2013-08-20 DIAGNOSIS — R109 Unspecified abdominal pain: Secondary | ICD-10-CM

## 2013-08-20 DIAGNOSIS — R82998 Other abnormal findings in urine: Secondary | ICD-10-CM

## 2013-08-20 DIAGNOSIS — R935 Abnormal findings on diagnostic imaging of other abdominal regions, including retroperitoneum: Secondary | ICD-10-CM

## 2013-08-20 LAB — POCT URINALYSIS DIPSTICK: Leukocytes, UA: NEGATIVE

## 2013-08-20 NOTE — Assessment & Plan Note (Signed)
New.  Subchondral cyst of R hip vs metastatic lesion given hx of renal cell cancer.  Pt prefers to proceed w/ MRI after discussing results.  Requires open MRI due to claustrophobia.  Discussed w/ radiology and given pt's solitary kidney, will get scan w/out contrast.  Pt expressed understanding and is in agreement w/ plan.

## 2013-08-20 NOTE — Patient Instructions (Addendum)
We'll call you with your MRI appt We'll notify you of your urine culture results Continue the Ranitidine daily for the abdominal pain Increase your diet slowly- your pancreas is better Call with any questions or concerns Hang in there!!

## 2013-08-20 NOTE — Telephone Encounter (Signed)
If no dizziness, increase Cycloset to 3 tablets a day upon waking up in am. If sugars persist >160, will need increase Lantus, please tell her to call us back.

## 2013-08-20 NOTE — Telephone Encounter (Signed)
Pt called stating that she her sugars have been between 173 and 220. Pt wanted to know if Dr Elvera Lennox wanted to make any changes to her insulin. Please advise. (I will call her daughter, Judeth Cornfield with any changes, [phone]).

## 2013-08-20 NOTE — Assessment & Plan Note (Signed)
Improved w/ addition of Zantac.  Pt is now tolerating diet.  CT showed no evidence of pancreatitis.  Will continue to follow.

## 2013-08-20 NOTE — Progress Notes (Signed)
  Subjective:    Patient ID: [name], female    DOB: August 08, 1956, 57 y.o.   MRN: [id number]  HPI ER F/U from 10/3  Pyuria- pt had UA w/ micro done while in ER.  Was found to have leuks, bacteria but no culture done.  Denies dysuria, hematuria.  + frequency, some urgency.  No hesitancy.  Pt feels she is emptying completely.  + urinary odor.  Abd pain- pt reports pain has dramatically improved since starting Ranitidine.  Lipase is trending down.  Still eating 'lightly'.  No N/V/D.  Abnormal CT- pt was found to have R hip lesion w/ sclerotic rim on CT.  Spoke w/ radiology who feels that this is most likely a subchondral cyst and is <5% risk of metastatic lesion.  Pt very upset by these results and more upset that this was not discussed w/ her in ER.  Discussed options for f/u- waiting, repeat CT, MRI.   Review of Systems For ROS see HPI     Objective:   Physical Exam  Vitals reviewed. Constitutional: She is oriented to person, place, and time. She appears well-developed and well-nourished. No distress.  HENT:  Head: Normocephalic and atraumatic.  Cardiovascular: Intact distal pulses.   Abdominal: Soft. Bowel sounds are normal. She exhibits no distension. There is no tenderness. There is no rebound and no guarding.  Musculoskeletal: She exhibits no edema.  Neurological: She is alert and oriented to person, place, and time.  Skin: Skin is warm and dry.  Psychiatric: She has a normal mood and affect. Her behavior is normal. Thought content normal.          Assessment & Plan:

## 2013-08-20 NOTE — Assessment & Plan Note (Signed)
New.  Noted in ER labs but urine was not sent for cx and pt was not treated.  Will attempt to send for cx today but sample amount was very limited.  Will hold on abx since pt is feeling well at this time.

## 2013-08-21 ENCOUNTER — Telehealth: Payer: Self-pay | Admitting: *Deleted

## 2013-08-21 LAB — URINE CULTURE: Organism ID, Bacteria: NO GROWTH

## 2013-08-21 NOTE — Telephone Encounter (Signed)
Called pt's daughter, Judeth Cornfield, [phone] with the changes, per pt. Dr Elvera Lennox said, If there is no dizziness, increase Cycloset to 3 tablets a day upon waking in the AM. If sugars consistently stay >160, she will need to increase the Lantus. Please call us back if this happens. She understood.

## 2013-08-22 ENCOUNTER — Telehealth: Payer: Self-pay | Admitting: *Deleted

## 2013-08-22 ENCOUNTER — Ambulatory Visit: Payer: BC Managed Care – PPO | Admitting: Family Medicine

## 2013-08-22 ENCOUNTER — Encounter: Payer: Self-pay | Admitting: General Practice

## 2013-08-22 ENCOUNTER — Ambulatory Visit: Payer: BC Managed Care – PPO | Admitting: Internal Medicine

## 2013-08-22 NOTE — Telephone Encounter (Signed)
Pt called and was made aware of her urine culture results.

## 2013-08-24 ENCOUNTER — Encounter (INDEPENDENT_AMBULATORY_CARE_PROVIDER_SITE_OTHER): Payer: Self-pay

## 2013-08-24 ENCOUNTER — Ambulatory Visit (HOSPITAL_BASED_OUTPATIENT_CLINIC_OR_DEPARTMENT_OTHER)
Admission: RE | Admit: 2013-08-24 | Discharge: 2013-08-24 | Disposition: A | Discharge status: Home / Self Care | Payer: BC Managed Care – PPO | Source: Ambulatory Visit | Attending: Family Medicine | Admitting: Family Medicine

## 2013-08-24 DIAGNOSIS — R935 Abnormal findings on diagnostic imaging of other abdominal regions, including retroperitoneum: Secondary | ICD-10-CM

## 2013-08-24 DIAGNOSIS — Z85528 Personal history of other malignant neoplasm of kidney: Secondary | ICD-10-CM | POA: Insufficient documentation

## 2013-08-24 DIAGNOSIS — Z90710 Acquired absence of both cervix and uterus: Secondary | ICD-10-CM | POA: Insufficient documentation

## 2013-08-24 DIAGNOSIS — K5730 Diverticulosis of large intestine without perforation or abscess without bleeding: Secondary | ICD-10-CM | POA: Insufficient documentation

## 2013-08-24 DIAGNOSIS — M899 Disorder of bone, unspecified: Secondary | ICD-10-CM | POA: Insufficient documentation

## 2013-08-26 ENCOUNTER — Encounter: Payer: Self-pay | Admitting: General Practice

## 2013-08-27 ENCOUNTER — Telehealth: Payer: Self-pay | Admitting: *Deleted

## 2013-08-27 NOTE — Telephone Encounter (Signed)
Pt notified af MRI results

## 2013-09-10 ENCOUNTER — Encounter: Payer: Self-pay | Admitting: Physician Assistant

## 2013-09-10 ENCOUNTER — Ambulatory Visit (INDEPENDENT_AMBULATORY_CARE_PROVIDER_SITE_OTHER): Payer: BC Managed Care – PPO | Admitting: Physician Assistant

## 2013-09-10 ENCOUNTER — Other Ambulatory Visit (INDEPENDENT_AMBULATORY_CARE_PROVIDER_SITE_OTHER): Payer: BC Managed Care – PPO

## 2013-09-10 ENCOUNTER — Encounter: Payer: Self-pay | Admitting: Internal Medicine

## 2013-09-10 VITALS — BP 128/80 | HR 82 | Ht 61.0 in | Wt 231.0 lb

## 2013-09-10 DIAGNOSIS — R110 Nausea: Secondary | ICD-10-CM

## 2013-09-10 DIAGNOSIS — R1013 Epigastric pain: Secondary | ICD-10-CM

## 2013-09-10 DIAGNOSIS — R1012 Left upper quadrant pain: Secondary | ICD-10-CM

## 2013-09-10 LAB — AMYLASE: Amylase: 104 U/L (ref 27–131)

## 2013-09-10 LAB — LIPASE: Lipase: 60 U/L — ABNORMAL HIGH (ref 11.0–59.0)

## 2013-09-10 NOTE — Patient Instructions (Signed)
We have given you samples of Zegerid. Take 1 daily. We will not prescribe until after Dr. Leone Payor does the procedure and gives [place] the results.  You have been scheduled for an endoscopy with propofol. Please follow written instructions given to you at your visit today. If you use inhalers (even only as needed), please bring them with you on the day of your procedure. Your physician has requested that you go to www.startemmi.com and enter the access code given to you at your visit today. This web site gives a general overview about your procedure. However, you should still follow specific instructions given to you by our office regarding your preparation for the procedure.

## 2013-09-10 NOTE — Progress Notes (Signed)
Subjective:    Patient ID: [name], female    DOB: [birth year], 57 y.o.   MRN: [id number]  HPI Denesia is a pleasant 57 year old female known to Dr. Leone Payor. She had colonoscopy done in April of 2014 was found to have multiple colon polyps the largest was 8 mm. All of these were removed and were found to be inflammatory polyps. She is scheduled for 10 year interval followup. She has history of IBS, diabetes mellitus, obesity, and renal cell cancer for which she underwent a left nephrectomy. She also has history of hypertension and prior adenomatous colon polyps. Patient had and started on the toes up for her diabetes and states that within 3 weeks of starting the mucosa she began having upper of Donald pain. She had an ER visit and underwent CT scan of the abdomen and pelvis on October 3 2 to abdominal pain. She was found to be status post cholecystectomy and left nephrectomy pancreas was felt to be normal she had multiple ventral hernia defects and scattered diverticulosis. She was also found to have a lesion in the right acetabulum. She subsequently underwent an MRI which proved this to be a cyst. Victoza was stopped  Just prior  To the  ER visit as she did have a mildly elevated lipase documented 86. On 08/16/2013 lipase was 73, LFTs were normal as was her CBC. She has been taking Zantac 150 mg once daily but says that she has not noticed any improvement in her symptoms. She says initially she was having significant pain in the epigastrium radiating around the left into the back. She says the back pain has resolved but she continues to have the left upper quadrant discomfort which she describes as a burning pain. She has been eating but says she gets nauseated intermittently. Pain has also awakened her from sleep. She says her bowel movements have been normal no melena or hematochezia. And no documented fever or chills. She definitely feels that the pain is worse postprandially. She denies any  injury fall etc. Patient does not drink alcohol and has not been taking any regular aspirin or NSAIDs.    Review of Systems  Constitutional: Negative.   HENT: Negative.   Eyes: Negative.   Respiratory: Negative.   Cardiovascular: Negative.   Gastrointestinal: Positive for nausea and abdominal pain.  Endocrine: Negative.   Genitourinary: Negative.   Skin: Negative.   Allergic/Immunologic: Negative.   Neurological: Negative.   Hematological: Negative.   Psychiatric/Behavioral: Negative.    Outpatient Prescriptions Prior to Visit  Medication Sig Dispense Refill  . albuterol (PROVENTIL HFA;VENTOLIN HFA) 108 (90 BASE) MCG/ACT inhaler Inhale 2 puffs into the lungs every 4 (four) hours as needed for wheezing or shortness of breath. Shortness of breath.  1 Inhaler  3  . hydrochlorothiazide (MICROZIDE) 12.5 MG capsule Take 1 capsule (12.5 mg total) by mouth daily.  30 capsule  4  . Insulin Glargine (LANTUS SOLOSTAR) 100 UNIT/ML SOPN Inject 15 Units into the skin at bedtime.  5 pen  PRN  . oxyCODONE-acetaminophen (PERCOCET) 5-325 MG per tablet Take 1 tablet by mouth every 4 (four) hours as needed for pain.  20 tablet  0  . ranitidine (ZANTAC) 150 MG capsule Take 1 capsule (150 mg total) by mouth daily.  30 capsule  0  . Bromocriptine Mesylate 0.8 MG TABS Take 1.6 mg daily in am with breakfast  60 tablet  3   No facility-administered medications prior to visit.   No Known Allergies Patient Active  Problem List   Diagnosis Date Noted  . Pyuria 08/20/2013  . Abnormal abdominal CT scan 08/20/2013  . Abdominal pain 08/16/2013  . Back pain 07/31/2013  . LUQ pain 07/31/2013  . Laceration of hand 07/10/2013  . Hidradenitis suppurativa of right axilla 05/31/2013  . Sinusitis, acute 04/17/2013  . Bronchitis, acute, with bronchospasm 04/17/2013  . Renal cell carcinoma   . IBS (irritable bowel syndrome)   . S/p nephrectomy   . Diverticulosis of colon with hemorrhage - recurrent 09/19/2011     Class: Acute  . OBESITY, MORBID 09/16/2009  . 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 HYPERTENSION 06/23/2009  . Personal history of colonoc adenomas 12/12/2008   History  Substance Use Topics  . Smoking status: Never Smoker   . Smokeless tobacco: Never Used  . Alcohol Use: Yes   family history includes Colon polyps in her mother; Diabetes in her father and maternal grandmother. There is no history of Colon cancer.     Objective:   Physical Exam  Well-developed African American female in no acute distress, quite pleasant. Blood pressure 128/80 pulse 82 height 5 foot 1 weight 231 BMI of 43. HEENT nontraumatic normocephalic EOMI PERRLA sclera anicteric, Supple no JVD, Cardiovascular regular rate and rhythm with S1-S2 no murmur or gallop, Pulmonary clear bilaterally, Abdomen soft obese she is tender in the epigastrium and left upper quadrant and also quite tender along the left costal margin, Bowel sounds present no palpable mass or hepatosplenomegaly, Rectal exam not done, Extremities no clubbing cyanosis or edema skin warm and dry, Psych mood and affect normal and appropriate       Assessment & Plan:  #48  57 year old female with history of renal cell carcinoma status post left nephrectomy with 3 week history of left upper quadrant and epigastric pain. She has lab evidence for mild pancreatitis but no changes seen on CT scan. Patient had recently been started on Victoza which is known to have association with pancreatitis. That medicine has been discontinued but her symptoms persist. Exam is most consistent with a musculoskeletal/costochondritis type pain however patient definitely feels that her symptoms are worse postprandially and she is also had associated nausea and therefore cannot rule out a mild persistent pancreatitis versus gastritis or peptic ulcer disease #2 history of IBS #3 history of adenomatous colon polyps last colonoscopy April 2014 #4 morbid  obesity #5 diabetes mellitus #6 diverticulosis #7 hypertension #8 status post cholecystectomy  Plan; patient to stay off Victoza Repeat amylase and lipase today Switch from ranitidine to Zegerid 40 mg by mouth every morning samples given Schedule for upper endoscopy with Dr. Leone Payor. Procedure discussed in detail with the patient and she is agreeable to proceed We discussed costochondritis and patient is aware that at least part of her pain is more musculoskeletal in etiology. She is intolerant to NSAIDs, can use Tylenol and local heat. Further plans pending results of above

## 2013-09-13 ENCOUNTER — Encounter: Payer: Self-pay | Admitting: Internal Medicine

## 2013-09-13 ENCOUNTER — Ambulatory Visit (AMBULATORY_SURGERY_CENTER): Payer: BC Managed Care – PPO | Admitting: Internal Medicine

## 2013-09-13 VITALS — BP 139/78 | HR 76 | Temp 96.7°F | Resp 15 | Ht 61.0 in | Wt 231.0 lb

## 2013-09-13 DIAGNOSIS — R1012 Left upper quadrant pain: Secondary | ICD-10-CM

## 2013-09-13 DIAGNOSIS — B9681 Helicobacter pylori [H. pylori] as the cause of diseases classified elsewhere: Secondary | ICD-10-CM

## 2013-09-13 DIAGNOSIS — K2990 Gastroduodenitis, unspecified, without bleeding: Secondary | ICD-10-CM

## 2013-09-13 DIAGNOSIS — K2970 Gastritis, unspecified, without bleeding: Secondary | ICD-10-CM

## 2013-09-13 DIAGNOSIS — A048 Other specified bacterial intestinal infections: Secondary | ICD-10-CM

## 2013-09-13 HISTORY — DX: Helicobacter pylori (H. pylori) as the cause of diseases classified elsewhere: B96.81

## 2013-09-13 LAB — GLUCOSE, CAPILLARY
Glucose-Capillary: 336 mg/dL — ABNORMAL HIGH (ref 70–99)
Glucose-Capillary: 326 mg/dL — ABNORMAL HIGH (ref 70–99)
Glucose-Capillary: 371 mg/dL — ABNORMAL HIGH (ref 70–99)

## 2013-09-13 MED ORDER — SODIUM CHLORIDE 0.9 % IV SOLN: 500.0000 mL | INTRAVENOUS | Status: DC

## 2013-09-13 MED ORDER — INSULIN ASPART 100 UNIT/ML ~~LOC~~ SOLN
10.0000 [IU] | Freq: Once | SUBCUTANEOUS | Status: AC
  Administered 2013-09-13: 10 [IU] via SUBCUTANEOUS

## 2013-09-13 NOTE — Progress Notes (Signed)
Agree with Ms. Esterwood's assessment and plan. Elisandro Jarrett E. Danyelle Brookover, MD, FACG   

## 2013-09-13 NOTE — Progress Notes (Signed)
Pt. CBG 371 in recovery. DR. Leone Payor order 10 units regular insulin.  Given per order.

## 2013-09-13 NOTE — Progress Notes (Signed)
Called to room to assist during endoscopic procedure.  Patient ID and intended procedure confirmed with present staff. Received instructions for my participation in the procedure from the performing physician.  

## 2013-09-13 NOTE — Op Note (Signed)
[facility] [address] [place], [address]   ENDOSCOPY PROCEDURE REPORT  PATIENT: [name], [name]  MR#: [id number] BIRTHDATE: Nov 16, 1955 , 56  yrs. old GENDER: Female ENDOSCOPIST: Iva Boop, MD, [facility] PROCEDURE DATE:  09/13/2013 PROCEDURE:  EGD w/ biopsy ASA CLASS:     Class III INDICATIONS:  abdominal pain in upper left quadrant. MEDICATIONS: propofol (Diprivan) 100mg  IV, MAC sedation, administered by CRNA, and These medications were titrated to patient response per physician's verbal order TOPICAL ANESTHETIC: none  DESCRIPTION OF PROCEDURE: After the risks benefits and alternatives of the procedure were thoroughly explained, informed consent was obtained.  The LB [id number] endoscope was introduced through the mouth and advanced to the second portion of the duodenum. Without limitations.  The instrument was slowly withdrawn as the mucosa was fully examined.    STOMACH: Mild gastritis (inflammation) was found in the gastric antrum.  Multiple biopsies were performed using cold forceps. Sample sent for histology.  The remainder of the upper endoscopy exam was otherwise normal. Retroflexed views revealed no abnormalities.     The scope was then withdrawn from the patient and the procedure completed.  COMPLICATIONS: There were no complications. ENDOSCOPIC IMPRESSION: 1.   Gastritis (inflammation) was found in the gastric antrum; multiple biopsies 2.   The remainder of the upper endoscopy exam was otherwise normal  RECOMMENDATIONS: Office will call with results and plans She has not had pancreatitis - only mild elevation of lipase, and pain persisted after Victoza stopped so I think could probably retry that if appropriate. we know some sxs are musculoskeletal - gastritis may be part of a multifactorial process and her uncontrolled DM also part of possible causes w/ autonomic neuropathy.   eSigned:  Iva Boop, MD, [facility]  09/13/2013 9:49 AM   HQ:[id number] Assunta Found, MD, Inda Castle, MD, and The Patient

## 2013-09-13 NOTE — Progress Notes (Signed)
Patient did not experience any of the following events: a burn prior to discharge; a fall within the facility; wrong site/side/patient/procedure/implant event; or a hospital transfer or hospital admission upon discharge from the facility. ([id number])Patient did not have preoperative order for IV antibiotic SSI prophylaxis. [phone])  Blood sugar down to 326.  Pt. Advised that she was given 10 units of regular insulin.  Will call her endocrinologist later today if sugars rise or she Experiences any problems.

## 2013-09-13 NOTE — Progress Notes (Signed)
Stable to RR 

## 2013-09-13 NOTE — Patient Instructions (Addendum)
I found inflammation in the stomach - called gastritis. I took biopsies to see what the cause is. We should call you next week with the answers and plans.  The pain was probably not from the Victoza since you still have it. It may be worth retrying that at some point.  I appreciate the opportunity to care for you. Iva Boop, MD, FACG   YOU HAD AN ENDOSCOPIC PROCEDURE TODAY AT THE [facility]: Refer to the procedure report that was given to you for any specific questions about what was found during the examination.  If the procedure report does not answer your questions, please call your gastroenterologist to clarify.  If you requested that your care partner not be given the details of your procedure findings, then the procedure report has been included in a sealed envelope for you to review at your convenience later.  YOU SHOULD EXPECT: Some feelings of bloating in the abdomen. Passage of more gas than usual.  Walking can help get rid of the air that was put into your GI tract during the procedure and reduce the bloating. If you had a lower endoscopy (such as a colonoscopy or flexible sigmoidoscopy) you may notice spotting of blood in your stool or on the toilet paper. If you underwent a bowel prep for your procedure, then you may not have a normal bowel movement for a few days.  DIET: Your first meal following the procedure should be a light meal and then it is ok to progress to your normal diet.  A half-sandwich or bowl of soup is an example of a good first meal.  Heavy or fried foods are harder to digest and may make you feel nauseous or bloated.  Likewise meals heavy in dairy and vegetables can cause extra gas to form and this can also increase the bloating.  Drink plenty of fluids but you should avoid alcoholic beverages for 24 hours.  ACTIVITY: Your care partner should take you home directly after the procedure.  You should plan to take it easy, moving slowly for the rest of  the day.  You can resume normal activity the day after the procedure however you should NOT DRIVE or use heavy machinery for 24 hours (because of the sedation medicines used during the test).    SYMPTOMS TO REPORT IMMEDIATELY: A gastroenterologist can be reached at any hour.  During normal business hours, 8:30 AM to 5:00 PM Monday through Friday, call [phone].  After hours and on weekends, please call the GI answering service at [phone] who will take a message and have the physician on call contact you.    Following upper endoscopy (EGD)  Vomiting of blood or coffee ground material  New chest pain or pain under the shoulder blades  Painful or persistently difficult swallowing  New shortness of breath  Fever of 100F or higher  Black, tarry-looking stools  FOLLOW UP: If any biopsies were taken you will be contacted by phone or by letter within the next 1-3 weeks.  Call your gastroenterologist if you have not heard about the biopsies in 3 weeks.  Our staff will call the home number listed on your records the next business day following your procedure to check on you and address any questions or concerns that you may have at that time regarding the information given to you following your procedure. This is a courtesy call and so if there is no answer at the home number and we have  not heard from you through the emergency physician on call, we will assume that you have returned to your regular daily activities without incident.  SIGNATURES/CONFIDENTIALITY: You and/or your care partner have signed paperwork which will be entered into your electronic medical record.  These signatures attest to the fact that that the information above on your After Visit Summary has been reviewed and is understood.  Full responsibility of the confidentiality of this discharge information lies with you and/or your care-partner.   Gastritis information given.

## 2013-09-16 ENCOUNTER — Telehealth: Payer: Self-pay | Admitting: *Deleted

## 2013-09-16 NOTE — Telephone Encounter (Signed)
[name], can she maybe come today for an appt rather than next week? I am not sure what happened ...when I last saw her 1 mo ago sugars were well controlled.

## 2013-09-16 NOTE — Telephone Encounter (Signed)
Called pt and asked her if she would be able to come this afternoon. Pt stated she does not have the money for the co-pay. She told me that she was not eating real well (she couldn't afford groceries) and her children bought her some groceries. Her bg was low and then she began to eat and her numbers went up. Pt stated that her bg goes up 20 points every time she eats. She wants Dr Elvera Lennox to know she is trying to keep an eye on her numbers. Be advised pt cannot come until the 13th.

## 2013-09-16 NOTE — Telephone Encounter (Signed)
I understand. She needs to decrease the mealtime insulin if starts seeing lows. Otherwise, continue current regimen.

## 2013-09-16 NOTE — Telephone Encounter (Signed)
Called pt and advised her per Dr Garnette Czech that she needs to decrease her mealtime insulin if she starts seeing lows. Otherwise, continue on your current medication. Pt understood, will see her next week.

## 2013-09-16 NOTE — Telephone Encounter (Signed)
  Follow up Call-  Call back number 09/13/2013 02/21/2013  Post procedure Call Back phone  # [phone]  Permission to leave phone message No Yes     Patient questions:  Do you have a fever, pain , or abdominal swelling? no Pain Score  0 *  Have you tolerated food without any problems? yes  Have you been able to return to your normal activities? yes  Do you have any questions about your discharge instructions: Diet   no Medications  no Follow up visit  no  Do you have questions or concerns about your Care? no  Actions: * If pain score is 4 or above: No action needed, pain <4.

## 2013-09-16 NOTE — Telephone Encounter (Signed)
Pt called to say that she had trouble with bg over the weekend, it was 458. Pt called on call and the physician made changes in her meds. Her bg is down to 200. Pt wants to know if she needs to continue on this med and dosage change until her next appt. Please advise.

## 2013-09-23 ENCOUNTER — Encounter: Payer: Self-pay | Admitting: Internal Medicine

## 2013-09-23 ENCOUNTER — Other Ambulatory Visit: Payer: Self-pay

## 2013-09-23 MED ORDER — BIS SUBCIT-METRONID-TETRACYC 140-125-125 MG PO CAPS: 3.0000 | ORAL_CAPSULE | Freq: Three times a day (TID) | ORAL | Status: DC

## 2013-09-23 NOTE — Progress Notes (Signed)
Quick Note:  H. Pylori gastritis  Please call her and Rx Pylera x 10 days - use Omeprazole 20 mg bid x 10 d with it REV about 2 months  No recall/letter   ______

## 2013-09-24 ENCOUNTER — Telehealth: Payer: Self-pay

## 2013-09-24 NOTE — Telephone Encounter (Signed)
Faxed completed form to [employer] to get prior authorization for pylera capsules, dx: 041.86 ( H. Pylori gastritis), fax# [phone].  We will let [facility] know outcome , their # [phone]

## 2013-09-26 ENCOUNTER — Encounter: Payer: Self-pay | Admitting: Internal Medicine

## 2013-09-26 ENCOUNTER — Telehealth: Payer: Self-pay

## 2013-09-26 ENCOUNTER — Ambulatory Visit (INDEPENDENT_AMBULATORY_CARE_PROVIDER_SITE_OTHER): Payer: BC Managed Care – PPO | Admitting: Internal Medicine

## 2013-09-26 VITALS — BP 158/78 | HR 80 | Temp 98.1°F | Resp 16 | Ht 61.0 in | Wt 232.3 lb

## 2013-09-26 DIAGNOSIS — E118 Type 2 diabetes mellitus with unspecified complications: Secondary | ICD-10-CM

## 2013-09-26 LAB — HEMOGLOBIN A1C: Hgb A1c MFr Bld: 12.5 % — ABNORMAL HIGH (ref 4.6–6.5)

## 2013-09-26 MED ORDER — BROMOCRIPTINE MESYLATE 0.8 MG PO TABS: ORAL_TABLET | ORAL | Status: DC

## 2013-09-26 MED ORDER — METFORMIN HCL 500 MG PO TABS: 1000.0000 mg | ORAL_TABLET | Freq: Two times a day (BID) | ORAL | Status: DC

## 2013-09-26 MED ORDER — BIS SUBCIT-METRONID-TETRACYC 140-125-125 MG PO CAPS: 3.0000 | ORAL_CAPSULE | Freq: Three times a day (TID) | ORAL | Status: DC

## 2013-09-26 NOTE — Patient Instructions (Addendum)
Please start Metformin 500 mg with dinner x 2 days. If you tolerate this well, add another Metformin tablet (500 mg) with breakfast x 2 days. If you tolerate this well, add another metformin tablet with dinner (total 1000 mg) x 2 days. If you tolerate this well, add another metformin tablets with breakfast (total 1000 mg). Continue with 1000 mg of metformin twice a day with breakfast and dinner. Increase Cycloset to 3.2 mg daily (4 tab) in am - as you roll out of bed. Increase Lantus to 25 units at bedtime.  Please return in 1 month with your sugar log.  Please stop at the lab.

## 2013-09-26 NOTE — Telephone Encounter (Signed)
Pylera prior authorization denied from [employer].  Spoke to patient and she will come by tomorrow and pick up sample kit with the #120 pills she needs to treat her H. Pylori Gastritis.

## 2013-09-26 NOTE — Progress Notes (Signed)
Patient ID: [name], female   DOB: December 26, 1955, 57 y.o., female   DOB: December 26, 1955, 57 y.o.   MRN: [id number]  HPI: [name] is a 57 y.o.-year-old female, returning for management of DM2, dx 1995, non-insulin-dependent, uncontrolled, with complications (CKD, retinopathy). Last visit 1.5 mo ago.  Since I last saw her, the patient sugars worsened, with highest sugars 335-458. She called our office after hours telephone line and she was started on mealtime insulin 6 units daily with her largest meal - on 09/14/2013 >> stopped 4 days ago.  Last hemoglobin A1c was: Lab Results  Component Value Date   HGBA1C 11.3* 06/14/2013   HGBA1C 11.6* 03/13/2013   HGBA1C 13.2* 10/05/2012   She is now in the regimen of: - Cycloset 2.4 mg  - Lantus 15 units at night - was on Humalog 6 units with largest meal of the day >> stopped We had to stop Metformin as she has a high creatinine and we stopped Victoza as she developed pancreatitis: AP and nausea and was on liquids for 2 weeks.  She has been on JanuMet, Metformin, Glyburide. She was on and off meds due to insurance - started to have insurance again on 02/2013.  All her meds are free now.  Pt checks her sugars 3 a day and they are: - am: 150-250 >> 109-180 >> 200-336 - lunch: 120s >> 278-336 - before dinner: 120-130 >> not checking - 1h after dinner: 180 >> not checking  No lows. Lowest sugar was 109; she has hypoglycemia awareness at 100.   Pt's meals are: - Breakfast: coffee (raw sugar) + egg + toast - Lunch: blackened salmon (baked) + string beans (no desert) - Dinner: baked chicken - free range/turkey - free range/ribeye + vegetables  - Snacks: graham crackers with PB, apples, cherry No juice or sodas.   - Has CKD, last BUN/creatinine:  Lab Results  Component Value Date   BUN 20 08/16/2013   CREATININE 0.98 08/16/2013  She did not tolerate ACE inhibitors in the past. - last set of lipids: Lab Results  Component Value Date   CHOL 165 03/13/2013   HDL 44.70  03/13/2013   LDLCALC 97 03/13/2013   TRIG 118.0 03/13/2013   CHOLHDL 4 03/13/2013  She is not on a statin. - last eye exam was in 07/02/2013 (Dr Allyne Gee) >> +DR.  - no numbness and tingling in her feet.  I reviewed her chart and she also has a history of poorly controlled hypertension, history of renal cell carcinoma-status post nephrectomy, morbid obesity, IBS.  ROS: Constitutional: no weight gain/loss, no fatigue, no subjective hyperthermia/hypothermia Eyes: no blurry vision, no xerophthalmia ENT: no sore throat, no nodules palpated in throat, no dysphagia/odynophagia, no hoarseness Cardiovascular: no CP/SOB/palpitations/leg swelling Respiratory: no cough/SOB Gastrointestinal: no N/V/D/C Musculoskeletal: no muscle/joint aches Skin: no rashes  I reviewed pt's medications, allergies, PMH, social hx, family hx and no changes required, except starting mealtime insulin as mentioned above.  PE: BP 158/78  Pulse 80  Temp(Src) 98.1 F (36.7 C) (Oral)  Resp 16  Ht 5\' 1"  (1.549 m)  Wt 232 lb 4.8 oz (105.371 kg)  BMI 43.92 kg/m2  LMP 11/16/2002 Wt Readings from Last 3 Encounters:  09/26/13 232 lb 4.8 oz (105.371 kg)  09/13/13 231 lb (104.781 kg)  09/10/13 231 lb (104.781 kg)   Constitutional: obese, in NAD Eyes: PERRLA, EOMI, no exophthalmos ENT: moist mucous membranes, no thyromegaly, no cervical lymphadenopathy Cardiovascular: RRR, No MRG Respiratory: CTA B Gastrointestinal: abdomen soft, NT, ND, BS+ Musculoskeletal: no deformities, strength  intact in all 4 Skin: moist, warm, acanthosis nigricans on neck Neurological: no tremor with outstretched hands, DTR normal in all 4  ASSESSMENT: 1. DM2, insulin-dependent, uncontrolled, with complications - diabetic nephropathy - DR  2. CKD - much improved - sees Dr Lacy Duverney - h/o Kidney cancer >> L kidney removed >> Dr Isabel Caprice  3. Obesity  PLAN:  1. Patient with long-standing, uncontrolled diabetes, with recent pancreatitis  episode (Stopped Victoza). Also with worsened CKD (stopped Metformin). Sugars worsened since last visit she was started on mealtime insulin with the largest of the day. - for now: - continue Cycloset 3.2 mg daily (and will increase higher if pt tolerates it well and sugars are high). No dizziness. - increase Lantus to 25 units at night - add Metformin and increase to maximal dose (since Cr improved to normal) - continue checking sugars at different times of the day - check 2-3 times a day, rotating checks - given new CBG logs - refuses flu vaccine - she is up-to-date with yearly eye exams - check HbA1c today - refilled Cycloset and Metformin - if cannot lose weight, might need referral to nutrition for individualized plan. - Return to clinic in one month with sugar log   Office Visit on 09/26/2013  Component Date Value Range Status  . Hemoglobin A1C 09/26/2013 12.5* 4.6 - 6.5 % Final   Glycemic Control Guidelines for People with Diabetes:Non Diabetic:  <6%Goal of Therapy: <7%Additional Action Suggested:  >8%     HbA1c worse, as expected. Continue with plan as discussed, but will likely need mealtime insulin soon.

## 2013-10-03 ENCOUNTER — Ambulatory Visit: Payer: BC Managed Care – PPO | Admitting: Family Medicine

## 2013-10-03 ENCOUNTER — Telehealth: Payer: Self-pay | Admitting: *Deleted

## 2013-10-03 ENCOUNTER — Ambulatory Visit (INDEPENDENT_AMBULATORY_CARE_PROVIDER_SITE_OTHER): Payer: BC Managed Care – PPO | Admitting: Family Medicine

## 2013-10-03 ENCOUNTER — Encounter: Payer: Self-pay | Admitting: Family Medicine

## 2013-10-03 VITALS — BP 170/80 | HR 105 | Temp 98.2°F

## 2013-10-03 DIAGNOSIS — E118 Type 2 diabetes mellitus with unspecified complications: Secondary | ICD-10-CM

## 2013-10-03 DIAGNOSIS — R112 Nausea with vomiting, unspecified: Secondary | ICD-10-CM | POA: Insufficient documentation

## 2013-10-03 DIAGNOSIS — R040 Epistaxis: Secondary | ICD-10-CM | POA: Insufficient documentation

## 2013-10-03 DIAGNOSIS — H8110 Benign paroxysmal vertigo, unspecified ear: Secondary | ICD-10-CM

## 2013-10-03 LAB — CBC WITH DIFFERENTIAL/PLATELET
Basophils Absolute: 0.1 10*3/uL (ref 0.0–0.1)
Basophils Relative: 0.8 % (ref 0.0–3.0)
Eosinophils Absolute: 0.1 10*3/uL (ref 0.0–0.7)
Eosinophils Relative: 1.6 % (ref 0.0–5.0)
MCHC: 32.6 g/dL (ref 30.0–36.0)
MCV: 84.8 fl (ref 78.0–100.0)
Monocytes Absolute: 0.7 10*3/uL (ref 0.1–1.0)
Neutrophils Relative %: 57.6 % (ref 43.0–77.0)
Platelets: 222 10*3/uL (ref 150.0–400.0)
RBC: 4.6 Mil/uL (ref 3.87–5.11)
RDW: 14.8 % — ABNORMAL HIGH (ref 11.5–14.6)

## 2013-10-03 LAB — HEPATIC FUNCTION PANEL
ALT: 26 U/L (ref 0–35)
Bilirubin, Direct: 0.1 mg/dL (ref 0.0–0.3)
Total Protein: 7.6 g/dL (ref 6.0–8.3)

## 2013-10-03 LAB — BASIC METABOLIC PANEL
BUN: 20 mg/dL (ref 6–23)
CO2: 27 mEq/L (ref 19–32)
Calcium: 9.3 mg/dL (ref 8.4–10.5)
Creatinine, Ser: 1.1 mg/dL (ref 0.4–1.2)

## 2013-10-03 LAB — TSH: TSH: 0.89 u[IU]/mL (ref 0.35–5.50)

## 2013-10-03 MED ORDER — MECLIZINE HCL 50 MG PO TABS: 50.0000 mg | ORAL_TABLET | Freq: Three times a day (TID) | ORAL | Status: DC | PRN

## 2013-10-03 MED ORDER — ONDANSETRON 4 MG PO TBDP: 4.0000 mg | ORAL_TABLET | Freq: Three times a day (TID) | ORAL | Status: DC | PRN

## 2013-10-03 NOTE — Telephone Encounter (Signed)
Pt called stating that she does not have a headache, but her head feels weird. Pt stated that when she got up, she coughed up a blood clot. She has spit up more since and feels nauseated with some vomiting. Pt thought it might be her medication. Advised her pt to call her PCP, she may have a sinus infection or virus. Pt said she would call Dr Rennis Golden office. Be advised.

## 2013-10-03 NOTE — Telephone Encounter (Signed)
Patient called and was complaining of some nausea, vomiting, and lightheadedness. Patient also states that her nose was bleeding and she believes a clot came out. Patient is on the schedule for today. SW

## 2013-10-03 NOTE — Progress Notes (Signed)
Pre-visit discussion using our clinic review tool. No additional management support is needed unless otherwise documented below in the visit note.  

## 2013-10-03 NOTE — Patient Instructions (Signed)
Follow up as needed We'll notify you of your lab results Start the Meclizine as needed for dizziness Drink plenty of fluids Use the zofran for nausea Change positions slowly Call with any questions or concerns- particularly if something changes or you keep bleeding Hang in there!

## 2013-10-03 NOTE — Progress Notes (Signed)
  Subjective:    Patient ID: [name], female    DOB: 04-23-1956, 57 y.o.   MRN: [id number]  HPI Pre visit review using our clinic review tool, if applicable. No additional management support is needed unless otherwise documented below in the visit note.   Nausea, vomiting- yesterday pt returned from shopping and blew a 'blood clot out of nose'.  Had productive cough and spit some blood.  Pt has been having intermittent dizzy spells x1 month.  sxs are worse w/ lying down and rolling over.  Vomited x1.  CBG 84 in office.  Coughed up blood this AM.  Pt reports black, tarry stools.  Pt is currently being tx'd for H pylori.  Pt reports nausea since Tuesday.  Diarrhea since last week.  No known sick contacts.  Very difficult history to obtain b/c pt is having a hard time expressing her thoughts and putting symptoms in chronologic order Review of Systems For ROS see HPI     Objective:   Physical Exam  Vitals reviewed. Constitutional: She is oriented to person, place, and time. She appears well-developed and well-nourished.  HENT:  Head: Normocephalic and atraumatic.  Mouth/Throat: Uvula is midline and mucous membranes are normal.  TMs WNL No TTP over sinuses Minimal nasal congestion, dried blood visible in L nare  Eyes: Conjunctivae and EOM are normal. Pupils are equal, round, and reactive to light.  2-3 beats of horizontal nystagmus  Neck: Normal range of motion. Neck supple.  Cardiovascular: Normal rate, regular rhythm, normal heart sounds and intact distal pulses.   Pulmonary/Chest: Effort normal and breath sounds normal. No respiratory distress. She has no wheezes. She has no rales.  Abdominal: Soft. Bowel sounds are normal. She exhibits no distension. There is no tenderness. There is no rebound and no guarding.  Musculoskeletal: She exhibits no edema.  Lymphadenopathy:    She has no cervical adenopathy.  Neurological: She is alert and oriented to person, place, and time. She  has normal reflexes. No cranial nerve deficit. Coordination (off balance w/ standing and walking) abnormal.  Skin: Skin is warm and dry.  Psychiatric: She has a normal mood and affect. Her behavior is normal. Judgment and thought content normal.          Assessment & Plan:

## 2013-10-06 NOTE — Assessment & Plan Note (Signed)
New.  Suspect viral illness vs reaction to pt's H pylori meds.  Check labs to r/o infxn, biliary cause.  zofran prn.  Will follow.

## 2013-10-06 NOTE — Assessment & Plan Note (Signed)
No evidence of hypoglycemia contributing to nausea/vomiting or dizziness.

## 2013-10-06 NOTE — Assessment & Plan Note (Signed)
New.  Start Meclizine.  Reviewed supportive care and red flags that should prompt return.  Pt expressed understanding and is in agreement w/ plan.

## 2013-10-06 NOTE — Assessment & Plan Note (Signed)
New.  Suspect this is due to cold weather and dry heat.  No current bleeding.  Discussed humidifier.  Will follow.

## 2013-10-07 ENCOUNTER — Encounter: Payer: Self-pay | Admitting: Family Medicine

## 2013-10-09 ENCOUNTER — Telehealth: Payer: Self-pay | Admitting: Internal Medicine

## 2013-10-09 NOTE — Progress Notes (Signed)
Pt was seen in the office today for new diabetic counseling. Pt was demonstrated how to test her glucose levels, using a lancet, how the glucometer works, and how her insulin pen dispenses the medication. Pt was also given a blood sugar diary and referral was placed at pt's request to endocrinology. This teaching was provided by Lorene Dy, CMA due to Dois Davenport being in a meeting.

## 2013-10-09 NOTE — Telephone Encounter (Signed)
Patient advised to take Pylera until the box is gone. All questions answered.  She will call back for any additional questions or concerns

## 2013-10-28 ENCOUNTER — Ambulatory Visit (INDEPENDENT_AMBULATORY_CARE_PROVIDER_SITE_OTHER): Payer: BC Managed Care – PPO | Admitting: Internal Medicine

## 2013-10-28 ENCOUNTER — Encounter: Payer: Self-pay | Admitting: Internal Medicine

## 2013-10-28 VITALS — BP 128/76 | HR 79 | Temp 98.5°F | Resp 12 | Wt 232.0 lb

## 2013-10-28 DIAGNOSIS — E118 Type 2 diabetes mellitus with unspecified complications: Secondary | ICD-10-CM

## 2013-10-28 MED ORDER — BROMOCRIPTINE MESYLATE 0.8 MG PO TABS: ORAL_TABLET | ORAL | Status: DC

## 2013-10-28 NOTE — Patient Instructions (Addendum)
-   continue Cycloset 3.2 mg daily  - continue Lantus to 25 units at night  - continue Metformin 1000 mg 2x a day Please come back for a follow-up appointment in 2 months, with your sugar log.  KEEP UP THE GREAT WORK!  HAPPY HOLIDAYS!

## 2013-10-28 NOTE — Progress Notes (Signed)
Patient ID: [name], female   DOB: [birth year], 57 y.o.   MRN: [id number]  HPI: [name] is a 57 y.o.-year-old female, returning for management of DM2, dx 1995, insulin-dependent, uncontrolled, with complications (CKD, retinopathy). Last visit 1.5 mo ago.  Last hemoglobin A1c was: Lab Results  Component Value Date   HGBA1C 12.5* 09/26/2013   HGBA1C 11.3* 06/14/2013   HGBA1C 11.6* 03/13/2013   She is now in the regimen of: - Cycloset 2.4 mg >> 3.2 mg at last visit - Lantus 15 units at night >> 25 units - Metformin 1000 mg bid She was on Humalog 6 units with largest meal of the day >> stopped We had to stop Metformin as she has a high creatinine and we stopped Victoza as she developed pancreatitis: AP and nausea and was on liquids for 2 weeks.  She has been on JanuMet, Metformin, Glyburide. She was on and off meds due to insurance - started to have insurance again on 02/2013.  All her meds are free now.  Pt checks her sugars 3 a day and they are RADICALLY IMPROVED: - am: 150-250 >> 109-180 >> 200-336 >> 87-129 - after b'fast: 120 and 218 - lunch: 120s >> 278-336 >> 80-130 - before dinner: 120-130 >> not checking >> 115-134 - 1h after dinner: 180 >> not checking  - bedtime: 105-152 No lows. Lowest sugar was 109; she has hypoglycemia awareness at 100.   Pt's meals are: - Breakfast: coffee (raw sugar) + egg + toast - Lunch: blackened salmon (baked) + string beans (no desert) - Dinner: baked chicken - free range/turkey - free range/ribeye + vegetables  - Snacks: graham crackers with PB, apples, cherry No juice or sodas.   - Has CKD, last BUN/creatinine:  Lab Results  Component Value Date   BUN 20 10/03/2013   CREATININE 1.1 10/03/2013  She did not tolerate ACE inhibitors in the past. - last set of lipids: Lab Results  Component Value Date   CHOL 165 03/13/2013   HDL 44.70 03/13/2013   LDLCALC 97 03/13/2013   TRIG 118.0 03/13/2013   CHOLHDL 4 03/13/2013  She is  not on a statin. - last eye exam was in 07/02/2013 (Dr Allyne Gee) >> +DR.  - no numbness and tingling in her feet.  I reviewed her chart and she also has a history of poorly controlled hypertension, history of renal cell carcinoma-status post nephrectomy, morbid obesity, IBS.  ROS: Constitutional: no weight gain/loss, + decreased appetite, no fatigue, no subjective hyperthermia/hypothermia, + poor sleep Eyes: no blurry vision, no xerophthalmia ENT: no sore throat, no nodules palpated in throat, no dysphagia/odynophagia, no hoarseness Cardiovascular: no CP/SOB/palpitations/leg swelling Respiratory: no cough/SOB Gastrointestinal: no N/V/D/C Musculoskeletal: + muscle/+ joint aches Skin: no rashes  I reviewed pt's medications, allergies, PMH, social hx, family hx and no changes required, except starting mealtime insulin as mentioned above.  PE: BP 128/76  Pulse 79  Temp(Src) 98.5 F (36.9 C) (Oral)  Resp 12  Wt 232 lb (105.235 kg)  SpO2 98%  LMP 11/16/2002 Wt Readings from Last 3 Encounters:  10/28/13 232 lb (105.235 kg)  09/26/13 232 lb 4.8 oz (105.371 kg)  09/13/13 231 lb (104.781 kg)   Constitutional: obese, in NAD Eyes: PERRLA, EOMI, no exophthalmos ENT: moist mucous membranes, no thyromegaly, no cervical lymphadenopathy Cardiovascular: RRR, No MRG Respiratory: CTA B Gastrointestinal: abdomen soft, NT, ND, BS+ Musculoskeletal: no deformities, strength intact in all 4 Skin: moist, warm, acanthosis nigricans on neck   ASSESSMENT: 1. DM2,  insulin-dependent, uncontrolled, with complications - diabetic nephropathy - DR  2. CKD - much improved - sees Dr Lacy Duverney - h/o Kidney cancer >> L kidney removed >> Dr Isabel Caprice >> returns to see him in 11/2012  3. Obesity  PLAN:  1. Patient with long-standing, uncontrolled diabetes, with recent pancreatitis episode (Stopped Victoza). Now back on Metformin and we increased Lantus and Cycloset at last visit >> MUCH improved sugars -  I advised her to: - continue Cycloset 3.2 mg daily - continue Lantus to 25 units at night - continue Metformin 1000 mg 2x a day - continue checking sugars at different times of the day - check 2-3 times a day, rotating checks - given new CBG logs - refuses flu vaccine - she is up-to-date with yearly eye exams - check HbA1c at next visit - refilled Cycloset  - Return to clinic in 2 months with sugar log

## 2013-11-27 ENCOUNTER — Ambulatory Visit: Payer: BC Managed Care – PPO | Admitting: Internal Medicine

## 2013-12-31 ENCOUNTER — Ambulatory Visit: Payer: BC Managed Care – PPO | Admitting: Internal Medicine

## 2014-01-04 ENCOUNTER — Other Ambulatory Visit: Payer: Self-pay | Admitting: Family Medicine

## 2014-01-06 ENCOUNTER — Encounter: Payer: Self-pay | Admitting: Internal Medicine

## 2014-01-06 ENCOUNTER — Ambulatory Visit (INDEPENDENT_AMBULATORY_CARE_PROVIDER_SITE_OTHER): Payer: BC Managed Care – PPO | Admitting: Internal Medicine

## 2014-01-06 VITALS — BP 136/84 | HR 80 | Temp 98.2°F | Resp 12 | Wt 235.0 lb

## 2014-01-06 DIAGNOSIS — E118 Type 2 diabetes mellitus with unspecified complications: Secondary | ICD-10-CM

## 2014-01-06 LAB — HEMOGLOBIN A1C: Hgb A1c MFr Bld: 7.8 % — ABNORMAL HIGH (ref 4.6–6.5)

## 2014-01-06 MED ORDER — BROMOCRIPTINE MESYLATE 0.8 MG PO TABS: ORAL_TABLET | ORAL | Status: DC

## 2014-01-06 MED ORDER — INSULIN GLARGINE 100 UNIT/ML SOLOSTAR PEN: 20.0000 [IU] | PEN_INJECTOR | Freq: Every day | SUBCUTANEOUS | Status: DC

## 2014-01-06 NOTE — Telephone Encounter (Signed)
Med filled.  

## 2014-01-06 NOTE — Progress Notes (Signed)
Patient ID: [name], female   DOB: August 20, 1956, 58 y.o.   MRN: [id number]  HPI: [name] is a 59 y.o.-year-old female, returning for management of DM2, dx 1995, insulin-dependent, uncontrolled, with complications (CKD, retinopathy). Last visit 7mo ago.  Last hemoglobin A1c was: Lab Results  Component Value Date   HGBA1C 12.5* 09/26/2013   HGBA1C 11.3* 06/14/2013   HGBA1C 11.6* 03/13/2013   She is now in the regimen of: - Cycloset 2.4 mg >> 3.2 mg >> had to decrease to 2.4 b/c of nausea/vomiting. She developed dizziness over the weekend, but after decreasing the dose to 3 tabs, mostly at night wen lays down - Lantus 15 units at night >> 25 units - Metformin 1000 mg bid She was on Humalog 6 units with largest meal of the day >> stopped We had to stop Metformin as she has a high creatinine and we stopped Victoza as she developed pancreatitis: AP and nausea and was on liquids for 2 weeks.  She has been on JanuMet, Metformin, Glyburide. She was on and off meds due to insurance - started to have insurance again on 02/2013.  All her meds are free now.  Pt checks her sugars 2x a day and they are almost all at goal - am: 150-250 >> 109-180 >> 200-336 >> 87-129 >> 74-109 - after b'fast: 120 and 218 >> n/c - lunch: 120s >> 278-336 >> 80-130 >> n/c - 2h after lunch: 191 - before dinner: 120-130 >> not checking >> 115-134 >> 120 - 1h after dinner: 180 >> not checking >> n/c - bedtime: 105-152 >> n/c No lows. Lowest sugar was 54; she has hypoglycemia awareness at 191  Pt's meals are  -reduced portions: - Breakfast: coffee (raw sugar) + egg + toast - Lunch: blackened salmon (baked) + string beans (no desert) - Dinner: baked chicken - free range/turkey - free range/ribeye + vegetables  - Snacks: graham crackers with PB, apples, cherry No juice or sodas.   - Has CKD, last BUN/creatinine:  Lab Results  Component Value Date   BUN 20 10/03/2013   CREATININE 1.1 10/03/2013  She  did not tolerate ACE inhibitors in the past. - last set of lipids: Lab Results  Component Value Date   CHOL 165 03/13/2013   HDL 44.70 03/13/2013   LDLCALC 97 03/13/2013   TRIG 118.0 03/13/2013   CHOLHDL 4 03/13/2013  She is not on a statin. - last eye exam was in 07/02/2013 (Dr Baird Cancer) >> +DR.  - no numbness and tingling in her feet.  I reviewed her chart and she also has a history of poorly controlled hypertension, history of renal cell carcinoma-status post nephrectomy, morbid obesity, IBS.  ROS: Constitutional: no weight gain/loss, no fatigue, no subjective hyperthermia/hypothermia, poor sleep Eyes: no blurry vision, no xerophthalmia ENT: no sore throat, no nodules palpated in throat, no dysphagia/odynophagia, no hoarseness Cardiovascular: no CP/SOB/palpitations/leg swelling Respiratory: no cough/SOB Gastrointestinal: + N/V - from increased Cycloset dose/+D/no C Musculoskeletal: no muscle/+ joint aches - shoulder Skin: no rashes + dizziness  I reviewed pt's medications, allergies, PMH, social hx, family hx and no changes required, except starting mealtime insulin as mentioned above.  PE: BP 136/84  Pulse 80  Temp(Src) 98.2 F (36.8 C) (Oral)  Resp 12  Wt 235 lb (106.595 kg)  SpO2 97%  LMP 11/16/2002 Wt Readings from Last 3 Encounters:  01/06/14 235 lb (106.595 kg)  10/28/13 232 lb (105.235 kg)  09/26/13 232 lb 4.8 oz (105.371 kg)  Constitutional: obese, in NAD Eyes: PERRLA, EOMI, no exophthalmos ENT: moist mucous membranes, no thyromegaly, no cervical lymphadenopathy Cardiovascular: RRR, No MRG Respiratory: CTA B Gastrointestinal: abdomen soft, NT, ND, BS+ Musculoskeletal: no deformities, strength intact in all 4 Skin: moist, warm, acanthosis nigricans on neck   ASSESSMENT: 1. DM2, insulin-dependent, uncontrolled, with complications - diabetic nephropathy - DR  2. CKD - much improved - sees Dr Clover Mealy - h/o Kidney cancer >> L kidney removed >> Dr Risa Grill  >> returns to see him in 11/2012  3. Obesity  PLAN:  1. Patient with long-standing, uncontrolled diabetes, with recent pancreatitis episode (Stopped Victoza). Now back on Metformin and we increased Lantus and Cycloset at last visit >> MUCH improved sugars - I advised her to: - continue Cycloset 2.4 mg daily - decrease Lantus to 20 units at night - continue Metformin 1000 mg 2x a day - continue checking sugars at different times of the day - check 2-3 times a day, rotating checks >> let me know if sugars stay <100, in this case will need to decrease Lantus and the Cycloset. - given new CBG logs - she is up-to-date today - check HbA1c at next visit - Return to clinic in 3 months with sugar log   Pt felt sugar might be low >> CBG 85 in the office >> given a regular [employer].  Office Visit on 01/06/2014  Component Date Value Ref Range Status  . Hemoglobin A1C 01/06/2014 7.8* 4.6 - 6.5 % Final   Glycemic Control Guidelines for People with Diabetes:Non Diabetic:  <6%Goal of Therapy: <7%Additional Action Suggested:  >8%    HbA1c the best in years!!!

## 2014-01-06 NOTE — Patient Instructions (Signed)
Please continue Cycloset at 2.4 mg in am. Continue Metformin at 1000 mg 2x a day. Decrease Lantus to 20 units at night.  Stop at the lab.  Please return in 1.5 months with your sugar log.

## 2014-01-22 ENCOUNTER — Encounter: Payer: Self-pay | Admitting: Family Medicine

## 2014-01-22 ENCOUNTER — Ambulatory Visit (INDEPENDENT_AMBULATORY_CARE_PROVIDER_SITE_OTHER): Payer: BC Managed Care – PPO | Admitting: Family Medicine

## 2014-01-22 VITALS — BP 142/92 | HR 94 | Temp 98.7°F | Resp 16 | Wt 236.5 lb

## 2014-01-22 DIAGNOSIS — L732 Hidradenitis suppurativa: Secondary | ICD-10-CM

## 2014-01-22 DIAGNOSIS — J019 Acute sinusitis, unspecified: Secondary | ICD-10-CM

## 2014-01-22 MED ORDER — DOXYCYCLINE HYCLATE 100 MG PO TABS: 100.0000 mg | ORAL_TABLET | Freq: Two times a day (BID) | ORAL | Status: DC

## 2014-01-22 NOTE — Progress Notes (Signed)
Pre visit review using our clinic review tool, if applicable. No additional management support is needed unless otherwise documented below in the visit note. 

## 2014-01-22 NOTE — Assessment & Plan Note (Signed)
Recurrent problem for pt.  Restart Doxy for both this and her current sinus infxn.  Refer to surgery for ongoing management.  Pt expressed understanding and is in agreement w/ plan.

## 2014-01-22 NOTE — Patient Instructions (Signed)
Follow up as needed Start the Doxycycline twice daily- take w/ food We'll call you with your surgery appt for the armpit infection Apply hot compresses the the under arm Use the cough syrup as needed Add Mucinex to thin your congestion Call with any questions or concerns Hang in there!!!

## 2014-01-22 NOTE — Assessment & Plan Note (Signed)
Pt's sxs and PE consistent w/ infxn.  Start doxy.  Cough meds prn.  Reviewed supportive care and red flags that should prompt return.  Pt expressed understanding and is in agreement w/ plan.

## 2014-01-22 NOTE — Progress Notes (Signed)
   Subjective:    Patient ID: [name], female    DOB: Nov 21, 1955, 58 y.o.   MRN: [id number]  HPI URI- sxs started 2 weeks ago.  + HA, chills, nasal congestion, facial pain/pressure, cough.  sxs worse at night.  Bilateral ear fullness.  No N/V/D.  + sick contacts.  Hidradenitis R axilla- pt reports area has not healed in over 1 yr.  Still draining intermittently.  Painful, malodorous   Review of Systems For ROS see HPI     Objective:   Physical Exam  Constitutional: She appears well-developed and well-nourished. No distress.  HENT:  Head: Normocephalic and atraumatic.  Right Ear: Tympanic membrane normal.  Left Ear: Tympanic membrane normal.  Nose: Mucosal edema and rhinorrhea present. Right sinus exhibits maxillary sinus tenderness and frontal sinus tenderness. Left sinus exhibits maxillary sinus tenderness and frontal sinus tenderness.  Mouth/Throat: Uvula is midline and mucous membranes are normal. Posterior oropharyngeal erythema present. No oropharyngeal exudate.  Eyes: Conjunctivae and EOM are normal. Pupils are equal, round, and reactive to light.  Neck: Normal range of motion. Neck supple.  Cardiovascular: Normal rate, regular rhythm and normal heart sounds.   Pulmonary/Chest: Effort normal and breath sounds normal. No respiratory distress. She has no wheezes.  Lymphadenopathy:    She has no cervical adenopathy.  Skin: Skin is warm and dry.  Multiple open areas in R axilla oozing purulent drainage          Assessment & Plan:

## 2014-01-30 ENCOUNTER — Telehealth: Payer: Self-pay | Admitting: *Deleted

## 2014-01-30 NOTE — Telephone Encounter (Signed)
Patient called acquiring about referral to general surgery. Please advise. JG//CMA

## 2014-02-04 NOTE — Telephone Encounter (Signed)
Pt called and scheduled appt @ [facility].

## 2014-02-05 ENCOUNTER — Other Ambulatory Visit (INDEPENDENT_AMBULATORY_CARE_PROVIDER_SITE_OTHER): Payer: Self-pay | Admitting: *Deleted

## 2014-02-05 ENCOUNTER — Encounter (INDEPENDENT_AMBULATORY_CARE_PROVIDER_SITE_OTHER): Payer: Self-pay | Admitting: Surgery

## 2014-02-05 ENCOUNTER — Ambulatory Visit (INDEPENDENT_AMBULATORY_CARE_PROVIDER_SITE_OTHER): Payer: BC Managed Care – PPO | Admitting: Surgery

## 2014-02-05 ENCOUNTER — Encounter (INDEPENDENT_AMBULATORY_CARE_PROVIDER_SITE_OTHER): Payer: Self-pay

## 2014-02-05 VITALS — BP 180/100 | HR 88 | Temp 98.7°F | Resp 14 | Ht 61.0 in | Wt 237.0 lb

## 2014-02-05 DIAGNOSIS — L732 Hidradenitis suppurativa: Secondary | ICD-10-CM

## 2014-02-05 MED ORDER — SULFAMETHOXAZOLE-TMP DS 800-160 MG PO TABS: 1.0000 | ORAL_TABLET | Freq: Every day | ORAL | Status: DC

## 2014-02-05 MED ORDER — SULFAMETHOXAZOLE-TMP DS 800-160 MG PO TABS: 1.0000 | ORAL_TABLET | Freq: Every day | ORAL | Status: AC

## 2014-02-05 NOTE — Progress Notes (Signed)
Chief Complaint:  Right axillary hidradenitis for over one year  History of Present Illness:  [name] is an 58 y.o. female diabetic with a past history of left leg staph infection in 2005 who shaved her right axilla a year ago and within a month but developed hidradenitis. In looking at the chart has been treated on multiple occasions with doxycycline. She reports it is not overly tender but also drains periodically.  Past Medical History  Diagnosis Date  . Diverticulosis of colon with hemorrhage 2010, 09/2011  . Hypertension   . Personal history of colonic polyps     adenomas since 2010  . Diabetes mellitus   . Renal cell carcinoma 2004  . Blood transfusion   . IBS (irritable bowel syndrome)   . Hx: UTI (urinary tract infection)   . Anemia   . Helicobacter pylori gastritis 09/13/2013  . Blood transfusion without reported diagnosis     Past Surgical History  Procedure Laterality Date  . Nephrectomy      left  . Tubal ligation    . Knee arthroscopy      x2 left  . Hand surgery      x5 right  . Abdominal hysterectomy    . Cholecystectomy    . Upper gastrointestinal endoscopy  12/12/2008    gastroporesis  . Flexible sigmoidoscopy  12/12/2008    diverticulosis  . Colonoscopy  12/19/2008    diverticulosis  . Colonoscopy  11/17/2011    Procedure: COLONOSCOPY;  Surgeon: Zenovia Jarred, MD;  Location: [facility];  Service: Gastroenterology;  Laterality: N/A;    Current Outpatient Prescriptions  Medication Sig Dispense Refill  . albuterol (PROVENTIL HFA;VENTOLIN HFA) 108 (90 BASE) MCG/ACT inhaler Inhale 2 puffs into the lungs every 4 (four) hours as needed for wheezing or shortness of breath. Shortness of breath.  1 Inhaler  3  . Bromocriptine Mesylate 0.8 MG TABS Take 2.4 mg daily in am  120 tablet  11  . hydrochlorothiazide (MICROZIDE) 12.5 MG capsule TAKE 1 CAPSULE BY MOUTH ONCE DAILY  30 capsule  3  . Insulin Glargine (LANTUS SOLOSTAR) 100 UNIT/ML Solostar Pen Inject 20  Units into the skin at bedtime.  5 pen  PRN  . metFORMIN (GLUCOPHAGE) 500 MG tablet Take 2 tablets (1,000 mg total) by mouth 2 (two) times daily with a meal.  120 tablet  11  . ondansetron (ZOFRAN ODT) 4 MG disintegrating tablet Take 1 tablet (4 mg total) by mouth every 8 (eight) hours as needed for nausea or vomiting.  20 tablet  0  . oxyCODONE-acetaminophen (PERCOCET) 5-325 MG per tablet Take 1 tablet by mouth every 4 (four) hours as needed for pain.  20 tablet  0  . [DISCONTINUED] ramipril (ALTACE) 10 MG tablet Take 10 mg by mouth daily.       . [DISCONTINUED] sitaGLIPtan-metformin (JANUMET) 50-500 MG per tablet Take 1 tablet by mouth 2 (two) times daily with a meal.  60 tablet  0   No current facility-administered medications for this visit.   Review of patient's allergies indicates no known allergies. Family History  Problem Relation Age of Onset  . Colon polyps Mother   . Diabetes Maternal Grandmother   . Cancer Maternal Grandmother     breast  . Diabetes Father   . Colon cancer Neg Hx   . Cancer Daughter     breast   Social History:   reports that she has never smoked. She has never used smokeless tobacco. She reports that  she does not drink alcohol or use illicit drugs.   REVIEW OF SYSTEMS - PERTINENT POSITIVES ONLY: History of prior nephrectomy. GFR out lab is approximate 60  Physical Exam:   Blood pressure 180/100, pulse 88, temperature 98.7 F (37.1 C), temperature source Oral, resp. rate 14, height 5\' 1"  (1.549 m), weight 237 lb (107.502 kg), last menstrual period 11/16/2002. Body mass index is 44.8 kg/(m^2).  Gen:  [facility] African American female NAD  Neurological: Alert and oriented to person, place, and time. Motor and sensory function is grossly intact  Head: Normocephalic and atraumatic.  Eyes: Conjunctivae are normal. Pupils are equal, round, and reactive to light. No scleral icterus.  Neck: Normal range of motion. Neck supple. No tracheal deviation or thyromegaly  present.  Right axilla:  There is a complete distribution of hidradenitis throughout the hair bearing area of her axilla.  Some are small and there is a tender area that laterally has opened and drained.    LABORATORY RESULTS: No results found for this or any previous visit (from the past 48 hour(s)).  RADIOLOGY RESULTS: No results found.  Problem List: Patient Active Problem List   Diagnosis Date Noted  . Benign paroxysmal positional vertigo 10/03/2013  . Nausea with vomiting 10/03/2013  . Bleeding nose 10/03/2013  . Helicobacter pylori gastritis 09/13/2013  . Pyuria 08/20/2013  . Abnormal abdominal CT scan 08/20/2013  . Abdominal pain 08/16/2013  . Back pain 07/31/2013  . LUQ pain 07/31/2013  . Laceration of hand 07/10/2013  . Hidradenitis suppurativa of right axilla 05/31/2013  . Renal cell carcinoma   . IBS (irritable bowel syndrome)   . S/p nephrectomy   . 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 OBESITY, MORBID 09/16/2009  . DM (diabetes mellitus), type 2 with complications 95/28/4132  . HYPERTENSION 06/23/2009  . Personal history of colonoc adenomas 12/12/2008    Assessment & Plan: Hibiclens wash nightly and in AM Septra DS  1 QD (reduced dose for reduced GFR) Will see back in 4 weeks.     Matt B. Hassell Done, MD, [facility] [phone] beeper [phone]  02/05/2014 10:04 AM

## 2014-02-05 NOTE — Patient Instructions (Signed)
Hibiclens wash at bedtime and in the morning. Take Septra DS-one daily for 2 weeks.

## 2014-02-17 ENCOUNTER — Ambulatory Visit: Payer: BC Managed Care – PPO | Admitting: Internal Medicine

## 2014-02-24 ENCOUNTER — Other Ambulatory Visit: Payer: Self-pay | Admitting: Family Medicine

## 2014-02-24 ENCOUNTER — Encounter: Payer: Self-pay | Admitting: Internal Medicine

## 2014-02-24 ENCOUNTER — Ambulatory Visit (INDEPENDENT_AMBULATORY_CARE_PROVIDER_SITE_OTHER): Payer: BC Managed Care – PPO | Admitting: Internal Medicine

## 2014-02-24 VITALS — BP 124/70 | HR 90 | Temp 98.7°F | Resp 12 | Wt 233.0 lb

## 2014-02-24 DIAGNOSIS — E118 Type 2 diabetes mellitus with unspecified complications: Secondary | ICD-10-CM

## 2014-02-24 NOTE — Telephone Encounter (Signed)
Med filled.  

## 2014-02-24 NOTE — Progress Notes (Signed)
Patient ID: [name], female   DOB: 1956/03/09, 58 y.o.   MRN: [id number]  HPI: [name] is a 58 y.o.-year-old female, returning for management of DM2, dx 1995, insulin-dependent, uncontrolled, with complications (CKD, retinopathy). Last visit 72mo ago.  She developed diarrhea after starting ABx for hydradenitis.   Last hemoglobin A1c was: Lab Results  Component Value Date   HGBA1C 7.8* 01/06/2014   HGBA1C 12.5* 09/26/2013   HGBA1C 11.3* 06/14/2013   She is now in the regimen of: - Cycloset 2.4 mg >> 3.2 mg >> 2.4 mg but out in last 2 weeks, as she ran out of it  Had to decrease to 2.4 b/c of nausea/vomiting.  - Lantus 20 units at night - Metformin 1000 mg bid She was on Humalog 6 units with largest meal of the day >> stopped We had to stop Metformin as she has a high creatinine and we stopped Victoza as she developed pancreatitis: AP and nausea and was on liquids for 2 weeks.  She has been on JanuMet, Metformin, Glyburide. She was on and off meds due to insurance - started to have insurance again on 02/2013.  All her meds are free now.  Pt checks her sugars 2x a day and they are (no log): 90-140 - am: 150-250 >> 109-180 >> 200-336 >> 87-129 >> 74-109 >> mostly up to 140 (highest of the  - after b'fast: 120 and 218 >> n/c - lunch: 120s >> 278-336 >> 80-130 >> n/c >> 90-100 - 2h after lunch: 191 >> n/c - before dinner: 120-130 >> not checking >> 115-134 >> 120 >> low 100s - 1h after dinner: 180 >> not checking >> n/c - bedtime: 105-152 >> n/c No lows; she has hypoglycemia awareness at 90-100.   Pt's meals are  -reduced portions, more salads: - Breakfast: coffee (raw sugar) + egg + toast - Lunch: blackened salmon (baked) + string beans (no desert) - Dinner: baked chicken - free range/turkey - free range/ribeye + vegetables  - Snacks: graham crackers with PB, apples, cherry No juice or sodas.   - Has CKD, last BUN/creatinine:  Lab Results  Component Value Date    BUN 20 10/03/2013   CREATININE 1.1 10/03/2013  She did not tolerate ACE inhibitors in the past. - last set of lipids: Lab Results  Component Value Date   CHOL 165 03/13/2013   HDL 44.70 03/13/2013   LDLCALC 97 03/13/2013   TRIG 118.0 03/13/2013   CHOLHDL 4 03/13/2013  She is not on a statin. - last eye exam was in 07/02/2013 (Dr Baird Cancer) >> + DR.  - no numbness and tingling in her feet.  I reviewed her chart and she also has a history of poorly controlled hypertension, history of renal cell carcinoma-status post nephrectomy, morbid obesity, IBS.  ROS: Constitutional: no weight gain/loss, no fatigue, no subjective hyperthermia/hypothermia, poor sleep Eyes: no blurry vision, no xerophthalmia ENT: no sore throat, no nodules palpated in throat, no dysphagia/odynophagia, no hoarseness Cardiovascular: no CP/SOB/palpitations/leg swelling Respiratory: no cough/SOB Gastrointestinal: no N/V/+D/no C Musculoskeletal: no muscle/joint aches Skin: no rashes +HA I reviewed pt's medications, allergies, PMH, social hx, family hx and no changes required, except starting mealtime insulin as mentioned above.  PE: BP 124/70  Pulse 90  Temp(Src) 98.7 F (37.1 C) (Oral)  Resp 12  Wt 233 lb (105.688 kg)  SpO2 96%  LMP 11/16/2002 Wt Readings from Last 3 Encounters:  02/24/14 233 lb (105.688 kg)  02/05/14 237 lb (107.502 kg)  01/22/14  236 lb 8 oz (107.276 kg)   Constitutional: obese, in NAD Eyes: PERRLA, EOMI, no exophthalmos ENT: moist mucous membranes, no thyromegaly, no cervical lymphadenopathy Cardiovascular: RRR, No MRG Respiratory: CTA B Gastrointestinal: abdomen soft, NT, ND, BS+ Musculoskeletal: no deformities, strength intact in all 4 Skin: moist, warm, acanthosis nigricans on neck   ASSESSMENT: 1. DM2, insulin-dependent, uncontrolled, with complications - diabetic nephropathy - DR  2. CKD - much improved - sees Dr Clover Mealy - h/o Kidney cancer >> L kidney removed >> Dr  Risa Grill >> returns to see him in 11/2012  3. Obesity  PLAN:  1. Patient with long-standing, uncontrolled diabetes, with h/o recent pancreatitis episode (Stopped Victoza). Now back on Metformin and on Lantus. She stopped Cycloset when she ran out 2 weeks ago >> sugars did not increase much, especially since she has diarrhea in last 2 days - I advised her to: Patient Instructions  - Stay off the Cycloset - Continue Lantus 20 units at night - Continue Metformin 1000 mg 2x a day  Please return in 3 months with your sugar log.   - continue checking sugars at different times of the day - check 2 times a day, rotating checks >> let me know if sugars increase >> will need to restart Cycloset. - check HbA1c at next visit - advised to check with PCP Re: diarrhea post ABx.  - Return to clinic in 3 months with sugar log

## 2014-02-24 NOTE — Patient Instructions (Signed)
-   Stay off the Cycloset - Continue Lantus 20 units at night - Continue Metformin 1000 mg 2x a day  Please return in 3 months with your sugar log.

## 2014-03-01 ENCOUNTER — Encounter (HOSPITAL_COMMUNITY): Payer: Self-pay | Admitting: Emergency Medicine

## 2014-03-01 ENCOUNTER — Emergency Department (HOSPITAL_COMMUNITY)
Admission: EM | Admit: 2014-03-01 | Discharge: 2014-03-01 | Disposition: A | Discharge status: Home / Self Care | Payer: BC Managed Care – PPO | Attending: Emergency Medicine | Admitting: Emergency Medicine

## 2014-03-01 DIAGNOSIS — Z79899 Other long term (current) drug therapy: Secondary | ICD-10-CM | POA: Insufficient documentation

## 2014-03-01 DIAGNOSIS — Z9089 Acquired absence of other organs: Secondary | ICD-10-CM | POA: Insufficient documentation

## 2014-03-01 DIAGNOSIS — Z860100 Personal history of colon polyps, unspecified: Secondary | ICD-10-CM | POA: Insufficient documentation

## 2014-03-01 DIAGNOSIS — Z794 Long term (current) use of insulin: Secondary | ICD-10-CM | POA: Insufficient documentation

## 2014-03-01 DIAGNOSIS — Z9851 Tubal ligation status: Secondary | ICD-10-CM | POA: Insufficient documentation

## 2014-03-01 DIAGNOSIS — Z85528 Personal history of other malignant neoplasm of kidney: Secondary | ICD-10-CM | POA: Insufficient documentation

## 2014-03-01 DIAGNOSIS — R109 Unspecified abdominal pain: Secondary | ICD-10-CM | POA: Insufficient documentation

## 2014-03-01 DIAGNOSIS — Z90710 Acquired absence of both cervix and uterus: Secondary | ICD-10-CM | POA: Insufficient documentation

## 2014-03-01 DIAGNOSIS — Z862 Personal history of diseases of the blood and blood-forming organs and certain disorders involving the immune mechanism: Secondary | ICD-10-CM | POA: Insufficient documentation

## 2014-03-01 DIAGNOSIS — R000 Tachycardia, unspecified: Secondary | ICD-10-CM | POA: Insufficient documentation

## 2014-03-01 DIAGNOSIS — E119 Type 2 diabetes mellitus without complications: Secondary | ICD-10-CM | POA: Insufficient documentation

## 2014-03-01 DIAGNOSIS — Z9889 Other specified postprocedural states: Secondary | ICD-10-CM | POA: Insufficient documentation

## 2014-03-01 DIAGNOSIS — R197 Diarrhea, unspecified: Secondary | ICD-10-CM | POA: Insufficient documentation

## 2014-03-01 DIAGNOSIS — Z87440 Personal history of urinary (tract) infections: Secondary | ICD-10-CM | POA: Insufficient documentation

## 2014-03-01 DIAGNOSIS — R638 Other symptoms and signs concerning food and fluid intake: Secondary | ICD-10-CM | POA: Insufficient documentation

## 2014-03-01 DIAGNOSIS — Z8719 Personal history of other diseases of the digestive system: Secondary | ICD-10-CM | POA: Insufficient documentation

## 2014-03-01 DIAGNOSIS — R5381 Other malaise: Secondary | ICD-10-CM | POA: Insufficient documentation

## 2014-03-01 DIAGNOSIS — Z8619 Personal history of other infectious and parasitic diseases: Secondary | ICD-10-CM | POA: Insufficient documentation

## 2014-03-01 DIAGNOSIS — R5383 Other fatigue: Secondary | ICD-10-CM

## 2014-03-01 DIAGNOSIS — I10 Essential (primary) hypertension: Secondary | ICD-10-CM | POA: Insufficient documentation

## 2014-03-01 DIAGNOSIS — R112 Nausea with vomiting, unspecified: Secondary | ICD-10-CM

## 2014-03-01 LAB — COMPREHENSIVE METABOLIC PANEL
ALBUMIN: 3.3 g/dL — AB (ref 3.5–5.2)
ALT: 13 U/L (ref 0–35)
AST: 16 U/L (ref 0–37)
Alkaline Phosphatase: 99 U/L (ref 39–117)
BUN: 19 mg/dL (ref 6–23)
CALCIUM: 9.3 mg/dL (ref 8.4–10.5)
CO2: 24 mEq/L (ref 19–32)
CREATININE: 0.93 mg/dL (ref 0.50–1.10)
Chloride: 100 mEq/L (ref 96–112)
GFR calc Af Amer: 78 mL/min — ABNORMAL LOW (ref >90)
GFR, EST NON AFRICAN AMERICAN: 67 mL/min — AB (ref >90)
Glucose, Bld: 138 mg/dL — ABNORMAL HIGH (ref 70–99)
Potassium: 3.9 mEq/L (ref 3.7–5.3)
Sodium: 139 mEq/L (ref 137–147)
Total Bilirubin: 0.2 mg/dL — ABNORMAL LOW (ref 0.3–1.2)
Total Protein: 8.2 g/dL (ref 6.0–8.3)

## 2014-03-01 LAB — CBC WITH DIFFERENTIAL/PLATELET
BASOS PCT: 0 % (ref 0–1)
Basophils Absolute: 0 10*3/uL (ref 0.0–0.1)
EOS PCT: 1 % (ref 0–5)
Eosinophils Absolute: 0 10*3/uL (ref 0.0–0.7)
HCT: 40 % (ref 36.0–46.0)
Hemoglobin: 13.2 g/dL (ref 12.0–15.0)
Lymphocytes Relative: 15 % (ref 12–46)
Lymphs Abs: 1.3 10*3/uL (ref 0.7–4.0)
MCH: 28.2 pg (ref 26.0–34.0)
MCHC: 33 g/dL (ref 30.0–36.0)
MCV: 85.5 fL (ref 78.0–100.0)
Monocytes Absolute: 0.5 10*3/uL (ref 0.1–1.0)
Monocytes Relative: 6 % (ref 3–12)
NEUTROS PCT: 78 % — AB (ref 43–77)
Neutro Abs: 6.9 10*3/uL (ref 1.7–7.7)
PLATELETS: 228 10*3/uL (ref 150–400)
RBC: 4.68 MIL/uL (ref 3.87–5.11)
RDW: 14.5 % (ref 11.5–15.5)
WBC: 8.7 10*3/uL (ref 4.0–10.5)

## 2014-03-01 LAB — URINALYSIS, ROUTINE W REFLEX MICROSCOPIC
Bilirubin Urine: NEGATIVE
GLUCOSE, UA: NEGATIVE mg/dL
HGB URINE DIPSTICK: NEGATIVE
Ketones, ur: NEGATIVE mg/dL
LEUKOCYTES UA: NEGATIVE
Nitrite: NEGATIVE
Protein, ur: 100 mg/dL — AB
Specific Gravity, Urine: 1.021 (ref 1.005–1.030)
Urobilinogen, UA: 0.2 mg/dL (ref 0.0–1.0)
pH: 5.5 (ref 5.0–8.0)

## 2014-03-01 LAB — LIPASE, BLOOD: Lipase: 47 U/L (ref 11–59)

## 2014-03-01 LAB — CBG MONITORING, ED: GLUCOSE-CAPILLARY: 122 mg/dL — AB (ref 70–99)

## 2014-03-01 LAB — URINE MICROSCOPIC-ADD ON

## 2014-03-01 MED ORDER — ONDANSETRON 8 MG PO TBDP: 8.0000 mg | ORAL_TABLET | Freq: Three times a day (TID) | ORAL | Status: DC | PRN

## 2014-03-01 MED ORDER — SODIUM CHLORIDE 0.9 % IV BOLUS (SEPSIS)
1000.0000 mL | Freq: Once | INTRAVENOUS | Status: AC
  Administered 2014-03-01: 1000 mL via INTRAVENOUS

## 2014-03-01 MED ORDER — ONDANSETRON HCL 4 MG/2ML IJ SOLN
4.0000 mg | Freq: Once | INTRAMUSCULAR | Status: AC
  Administered 2014-03-01: 4 mg via INTRAVENOUS
  Filled 2014-03-01: qty 2

## 2014-03-01 NOTE — ED Provider Notes (Signed)
CSN: [id number]     Arrival date & time 03/01/14  1134 History   First MD Initiated Contact with Patient 03/01/14 1212     Chief Complaint  Patient presents with  . Emesis  . Bloated  . Diarrhea     (Consider location/radiation/quality/duration/timing/severity/associated sxs/prior Treatment) Patient is a 58 y.o. female presenting with vomiting and diarrhea. The history is provided by the patient.  Emesis Associated symptoms: abdominal pain and diarrhea   Associated symptoms: no headaches   Diarrhea Associated symptoms: abdominal pain and vomiting   Associated symptoms: no headaches    patient's had abdominal bloating for the last 5 days. She states she also developed nausea vomiting and diarrhea. She states she's had decreased appetite. She states she feels somewhat fatigued. She has mild crampy left-sided abdominal pain. No sick contacts. No known question of food that she ate.  Past Medical History  Diagnosis Date  . Diverticulosis of colon with hemorrhage 2010, 09/2011  . Hypertension   . Personal history of colonic polyps     adenomas since 2010  . Diabetes mellitus   . Renal cell carcinoma 2004  . Blood transfusion   . IBS (irritable bowel syndrome)   . Hx: UTI (urinary tract infection)   . Anemia   . Helicobacter pylori gastritis 09/13/2013  . Blood transfusion without reported diagnosis    Past Surgical History  Procedure Laterality Date  . Nephrectomy      left  . Tubal ligation    . Knee arthroscopy      x2 left  . Hand surgery      x5 right  . Abdominal hysterectomy    . Cholecystectomy    . Upper gastrointestinal endoscopy  12/12/2008    gastroporesis  . Flexible sigmoidoscopy  12/12/2008    diverticulosis  . Colonoscopy  12/19/2008    diverticulosis  . Colonoscopy  11/17/2011    Procedure: COLONOSCOPY;  Surgeon: Zenovia Jarred, MD;  Location: [facility];  Service: Gastroenterology;  Laterality: N/A;   Family History  Problem Relation Age of Onset  . Colon  polyps Mother   . Diabetes Maternal Grandmother   . Cancer Maternal Grandmother     breast  . Diabetes Father   . Colon cancer Neg Hx   . Cancer Daughter     breast   History  Substance Use Topics  . Smoking status: Never Smoker   . Smokeless tobacco: Never Used  . Alcohol Use: No   OB History   Grav Para Term Preterm Abortions TAB SAB Ect Mult Living                 Review of Systems  Constitutional: Negative for activity change and appetite change.  Eyes: Negative for pain.  Respiratory: Negative for chest tightness and shortness of breath.   Cardiovascular: Negative for chest pain and leg swelling.  Gastrointestinal: Positive for nausea, vomiting, abdominal pain and diarrhea.  Genitourinary: Negative for flank pain.  Musculoskeletal: Negative for back pain and neck stiffness.  Skin: Negative for rash.  Neurological: Negative for weakness, numbness and headaches.  Psychiatric/Behavioral: Negative for behavioral problems.      Allergies  Review of patient's allergies indicates no known allergies.  Home Medications   Prior to Admission medications   Medication Sig Start Date End Date Taking? Authorizing Provider  albuterol (PROVENTIL HFA;VENTOLIN HFA) 108 (90 BASE) MCG/ACT inhaler Inhale 2 puffs into the lungs every 4 (four) hours as needed for wheezing or shortness of breath. Shortness  of breath. 04/17/13   Midge Minium, MD  Bromocriptine Mesylate 0.8 MG TABS Take 2.4 mg daily in am 01/06/14   Philemon Kingdom, MD  hydrochlorothiazide (MICROZIDE) 12.5 MG capsule TAKE 1 CAPSULE BY MOUTH ONCE DAILY 02/24/14   Midge Minium, MD  Insulin Glargine (LANTUS SOLOSTAR) 100 UNIT/ML Solostar Pen Inject 20 Units into the skin at bedtime. 01/06/14   Philemon Kingdom, MD  metFORMIN (GLUCOPHAGE) 500 MG tablet Take 2 tablets (1,000 mg total) by mouth 2 (two) times daily with a meal. 09/26/13   Philemon Kingdom, MD  ondansetron (ZOFRAN ODT) 4 MG disintegrating tablet Take 1  tablet (4 mg total) by mouth every 8 (eight) hours as needed for nausea or vomiting. 10/03/13   Midge Minium, MD  oxyCODONE-acetaminophen (PERCOCET) 5-325 MG per tablet Take 1 tablet by mouth every 4 (four) hours as needed for pain. 08/16/13   Blanchard Kelch, MD   BP 141/66  Pulse 89  Temp(Src) 99.2 F (37.3 C) (Oral)  Resp 16  Ht 5\' 1"  (1.549 m)  Wt 233 lb (105.688 kg)  BMI 44.05 kg/m2  SpO2 100%  LMP 11/16/2002 Physical Exam  Nursing note and vitals reviewed. Constitutional: She is oriented to person, place, and time. She appears well-developed and well-nourished.  HENT:  Head: Normocephalic.  Cardiovascular: Regular rhythm and normal heart sounds.   No murmur heard. Mild tachycardia  Pulmonary/Chest: Effort normal and breath sounds normal. No respiratory distress. She has no wheezes. She has no rales.  Abdominal: Soft. She exhibits no distension. There is tenderness. There is no rebound and no guarding.  Hyperactive bowel sounds. Mild left-sided abdominal tenderness without rebound guarding.  Musculoskeletal: Normal range of motion.  Neurological: She is alert and oriented to person, place, and time. No cranial nerve deficit.  Skin: Skin is warm and dry.  Psychiatric: She has a normal mood and affect. Her speech is normal.    ED Course  Procedures (including critical care time) Labs Review Labs Reviewed  CBC WITH DIFFERENTIAL - Abnormal; Notable for the following:    Neutrophils Relative % 78 (*)    All other components within normal limits  COMPREHENSIVE METABOLIC PANEL - Abnormal; Notable for the following:    Glucose, Bld 138 (*)    Albumin 3.3 (*)    Total Bilirubin 0.2 (*)    GFR calc non Af Amer 67 (*)    GFR calc Af Amer 78 (*)    All other components within normal limits  URINALYSIS, ROUTINE W REFLEX MICROSCOPIC - Abnormal; Notable for the following:    Protein, ur 100 (*)    All other components within normal limits  URINE MICROSCOPIC-ADD ON -  Abnormal; Notable for the following:    Squamous Epithelial / LPF FEW (*)    All other components within normal limits  CBG MONITORING, ED - Abnormal; Notable for the following:    Glucose-Capillary 122 (*)    All other components within normal limits  LIPASE, BLOOD    Imaging Review No results found.   EKG Interpretation   Date/Time:  Saturday March 01 2014 12:03:14 EDT Ventricular Rate:  105 PR Interval:  185 QRS Duration: 84 QT Interval:  334 QTC Calculation: 441 R Axis:   118 Text Interpretation:  Sinus tachycardia Right axis deviation No  significant change since last tracing Confirmed by Alvino Chapel  MD, Ovid Curd  [phone]) on 03/01/2014 12:07:02 PM      MDM   Final diagnoses:  Nausea vomiting and diarrhea  Patient with nausea vomiting diarrhea. Has had a bloated feeling. Lab work reassuring and she is tolerated orals. Will discharge home.    Jasper Riling. Alvino Chapel, MD 03/02/14 2034

## 2014-03-01 NOTE — ED Notes (Signed)
IV fluids still running.

## 2014-03-01 NOTE — ED Notes (Signed)
Pt states she began feeling bloated on Monday with nausea.  Pt went to see her Endocrinologist.  They asked if she had seen her PCP, she had not.  Pt states that starting last night she has not been able to keep anything down.  Pt has N/V/D.  Pt reports some abdominal pain, mostly on the L side.

## 2014-03-01 NOTE — ED Notes (Signed)
Pt tolerating fluids.   

## 2014-03-01 NOTE — Discharge Instructions (Signed)
Diarrhea °Diarrhea is frequent loose and watery bowel movements. It can cause you to feel weak and dehydrated. Dehydration can cause you to become tired and thirsty, have a dry mouth, and have decreased urination that often is dark yellow. Diarrhea is a sign of another problem, most often an infection that will not last long. In most cases, diarrhea typically lasts 2 3 days. However, it can last longer if it is a sign of something more serious. It is important to treat your diarrhea as directed by your caregive to lessen or prevent future episodes of diarrhea. °CAUSES  °Some common causes include: °· Gastrointestinal infections caused by viruses, bacteria, or parasites. °· Food poisoning or food allergies. °· Certain medicines, such as antibiotics, chemotherapy, and laxatives. °· Artificial sweeteners and fructose. °· Digestive disorders. °HOME CARE INSTRUCTIONS °· Ensure adequate fluid intake (hydration): have 1 cup (8 oz) of fluid for each diarrhea episode. Avoid fluids that contain simple sugars or sports drinks, fruit juices, whole milk products, and sodas. Your urine should be clear or pale yellow if you are drinking enough fluids. Hydrate with an oral rehydration solution that you can purchase at pharmacies, retail stores, and online. You can prepare an oral rehydration solution at home by mixing the following ingredients together: °·   tsp table salt. °· ¾ tsp baking soda. °·  tsp salt substitute containing potassium chloride. °· 1  tablespoons sugar. °· 1 L (34 oz) of water. °· Certain foods and beverages may increase the speed at which food moves through the gastrointestinal (GI) tract. These foods and beverages should be avoided and include: °· Caffeinated and alcoholic beverages. °· High-fiber foods, such as raw fruits and vegetables, nuts, seeds, and whole grain breads and cereals. °· Foods and beverages sweetened with sugar alcohols, such as xylitol, sorbitol, and mannitol. °· Some foods may be well  tolerated and may help thicken stool including: °· Starchy foods, such as rice, toast, pasta, low-sugar cereal, oatmeal, grits, baked potatoes, crackers, and bagels. °· Bananas. °· Applesauce. °· Add probiotic-rich foods to help increase healthy bacteria in the GI tract, such as yogurt and fermented milk products. °· Wash your hands well after each diarrhea episode. °· Only take over-the-counter or prescription medicines as directed by your caregiver. °· Take a warm bath to relieve any burning or pain from frequent diarrhea episodes. °SEEK IMMEDIATE MEDICAL CARE IF:  °· You are unable to keep fluids down. °· You have persistent vomiting. °· You have blood in your stool, or your stools are black and tarry. °· You do not urinate in 6 8 hours, or there is only a small amount of very dark urine. °· You have abdominal pain that increases or localizes. °· You have weakness, dizziness, confusion, or lightheadedness. °· You have a severe headache. °· Your diarrhea gets worse or does not get better. °· You have a fever or persistent symptoms for more than 2 3 days. °· You have a fever and your symptoms suddenly get worse. °MAKE SURE YOU:  °· Understand these instructions. °· Will watch your condition. °· Will get help right away if you are not doing well or get worse. °Document Released: 10/21/2002 Document Revised: 10/17/2012 Document Reviewed: 07/08/2012 °ExitCare® Patient Information ©2014 [employer]. ° °Nausea and Vomiting °Nausea is a sick feeling that often comes before throwing up (vomiting). Vomiting is a reflex where stomach contents come out of your mouth. Vomiting can cause severe loss of body fluids (dehydration). Children and elderly adults can become   dehydrated quickly, especially if they also have diarrhea. Nausea and vomiting are symptoms of a condition or disease. It is important to find the cause of your symptoms. °CAUSES  °· Direct irritation of the stomach lining. This irritation can result from  increased acid production (gastroesophageal reflux disease), infection, food poisoning, taking certain medicines (such as nonsteroidal anti-inflammatory drugs), alcohol use, or tobacco use. °· Signals from the brain. These signals could be caused by a headache, heat exposure, an inner ear disturbance, increased pressure in the brain from injury, infection, a tumor, or a concussion, pain, emotional stimulus, or metabolic problems. °· An obstruction in the gastrointestinal tract (bowel obstruction). °· Illnesses such as diabetes, hepatitis, gallbladder problems, appendicitis, kidney problems, cancer, sepsis, atypical symptoms of a heart attack, or eating disorders. °· Medical treatments such as chemotherapy and radiation. °· Receiving medicine that makes you sleep (general anesthetic) during surgery. °DIAGNOSIS °Your caregiver may ask for tests to be done if the problems do not improve after a few days. Tests may also be done if symptoms are severe or if the reason for the nausea and vomiting is not clear. Tests may include: °· Urine tests. °· Blood tests. °· Stool tests. °· Cultures (to look for evidence of infection). °· X-rays or other imaging studies. °Test results can help your caregiver make decisions about treatment or the need for additional tests. °TREATMENT °You need to stay well hydrated. Drink frequently but in small amounts. You may wish to drink water, sports drinks, clear broth, or eat frozen ice pops or gelatin dessert to help stay hydrated. When you eat, eating slowly may help prevent nausea. There are also some antinausea medicines that may help prevent nausea. °HOME CARE INSTRUCTIONS  °· Take all medicine as directed by your caregiver. °· If you do not have an appetite, do not force yourself to eat. However, you must continue to drink fluids. °· If you have an appetite, eat a normal diet unless your caregiver tells you differently. °· Eat a variety of complex carbohydrates (rice, wheat, potatoes,  bread), lean meats, yogurt, fruits, and vegetables. °· Avoid high-fat foods because they are more difficult to digest. °· Drink enough water and fluids to keep your urine clear or pale yellow. °· If you are dehydrated, ask your caregiver for specific rehydration instructions. Signs of dehydration may include: °· Severe thirst. °· Dry lips and mouth. °· Dizziness. °· Dark urine. °· Decreasing urine frequency and amount. °· Confusion. °· Rapid breathing or pulse. °SEEK IMMEDIATE MEDICAL CARE IF:  °· You have blood or brown flecks (like coffee grounds) in your vomit. °· You have black or bloody stools. °· You have a severe headache or stiff neck. °· You are confused. °· You have severe abdominal pain. °· You have chest pain or trouble breathing. °· You do not urinate at least once every 8 hours. °· You develop cold or clammy skin. °· You continue to vomit for longer than 24 to 48 hours. °· You have a fever. °MAKE SURE YOU:  °· Understand these instructions. °· Will watch your condition. °· Will get help right away if you are not doing well or get worse. °Document Released: 10/31/2005 Document Revised: 01/23/2012 Document Reviewed: 03/30/2011 °ExitCare® Patient Information ©2014 [employer]. ° °

## 2014-03-04 IMAGING — CR DG CHEST 2V
2 series · 2 of 2 positions shown · non-contrast
Comparison: 01/05/2010

CLINICAL DATA: History of kidney cancer

CHEST - 2 VIEW

[w chest pa]
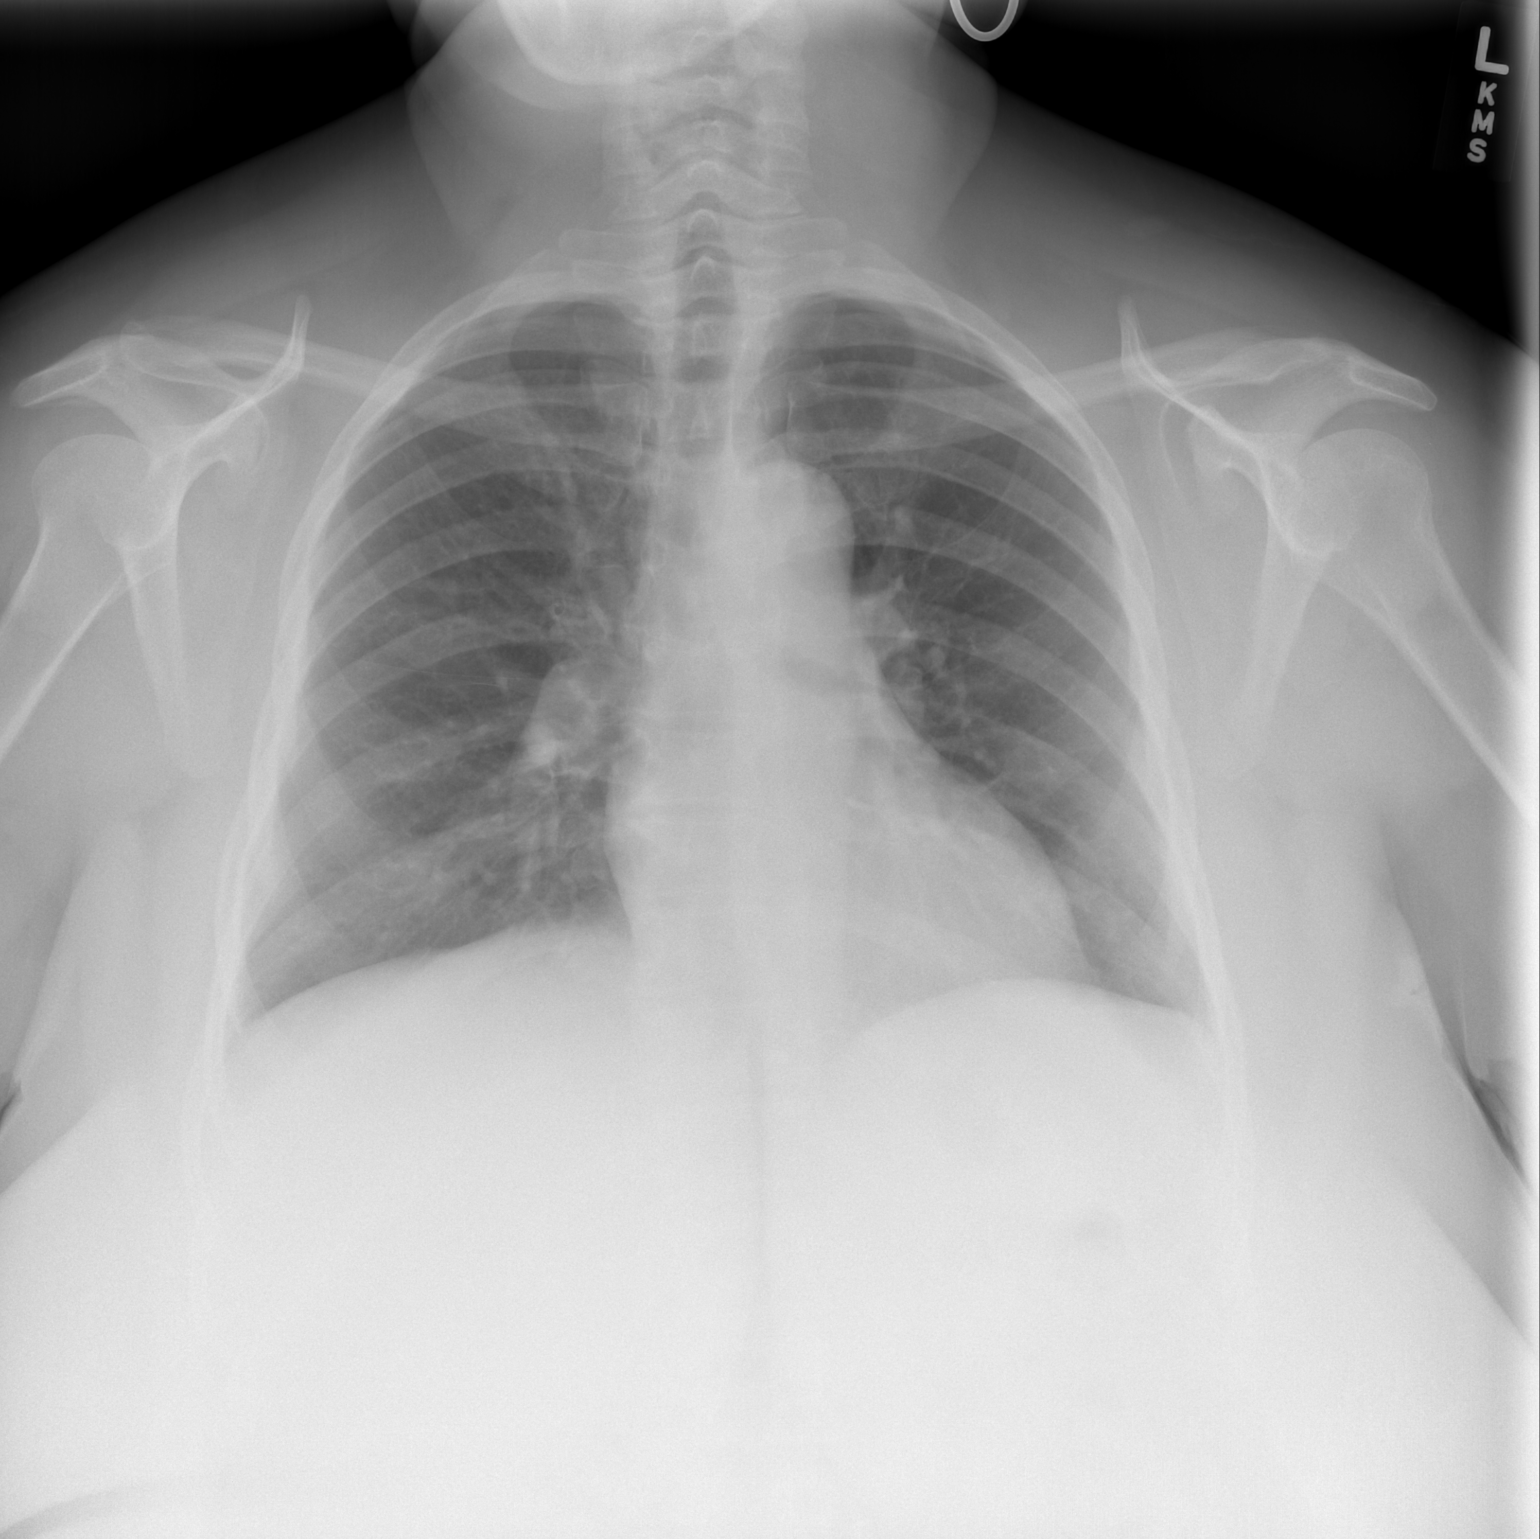

[w chest lat *]
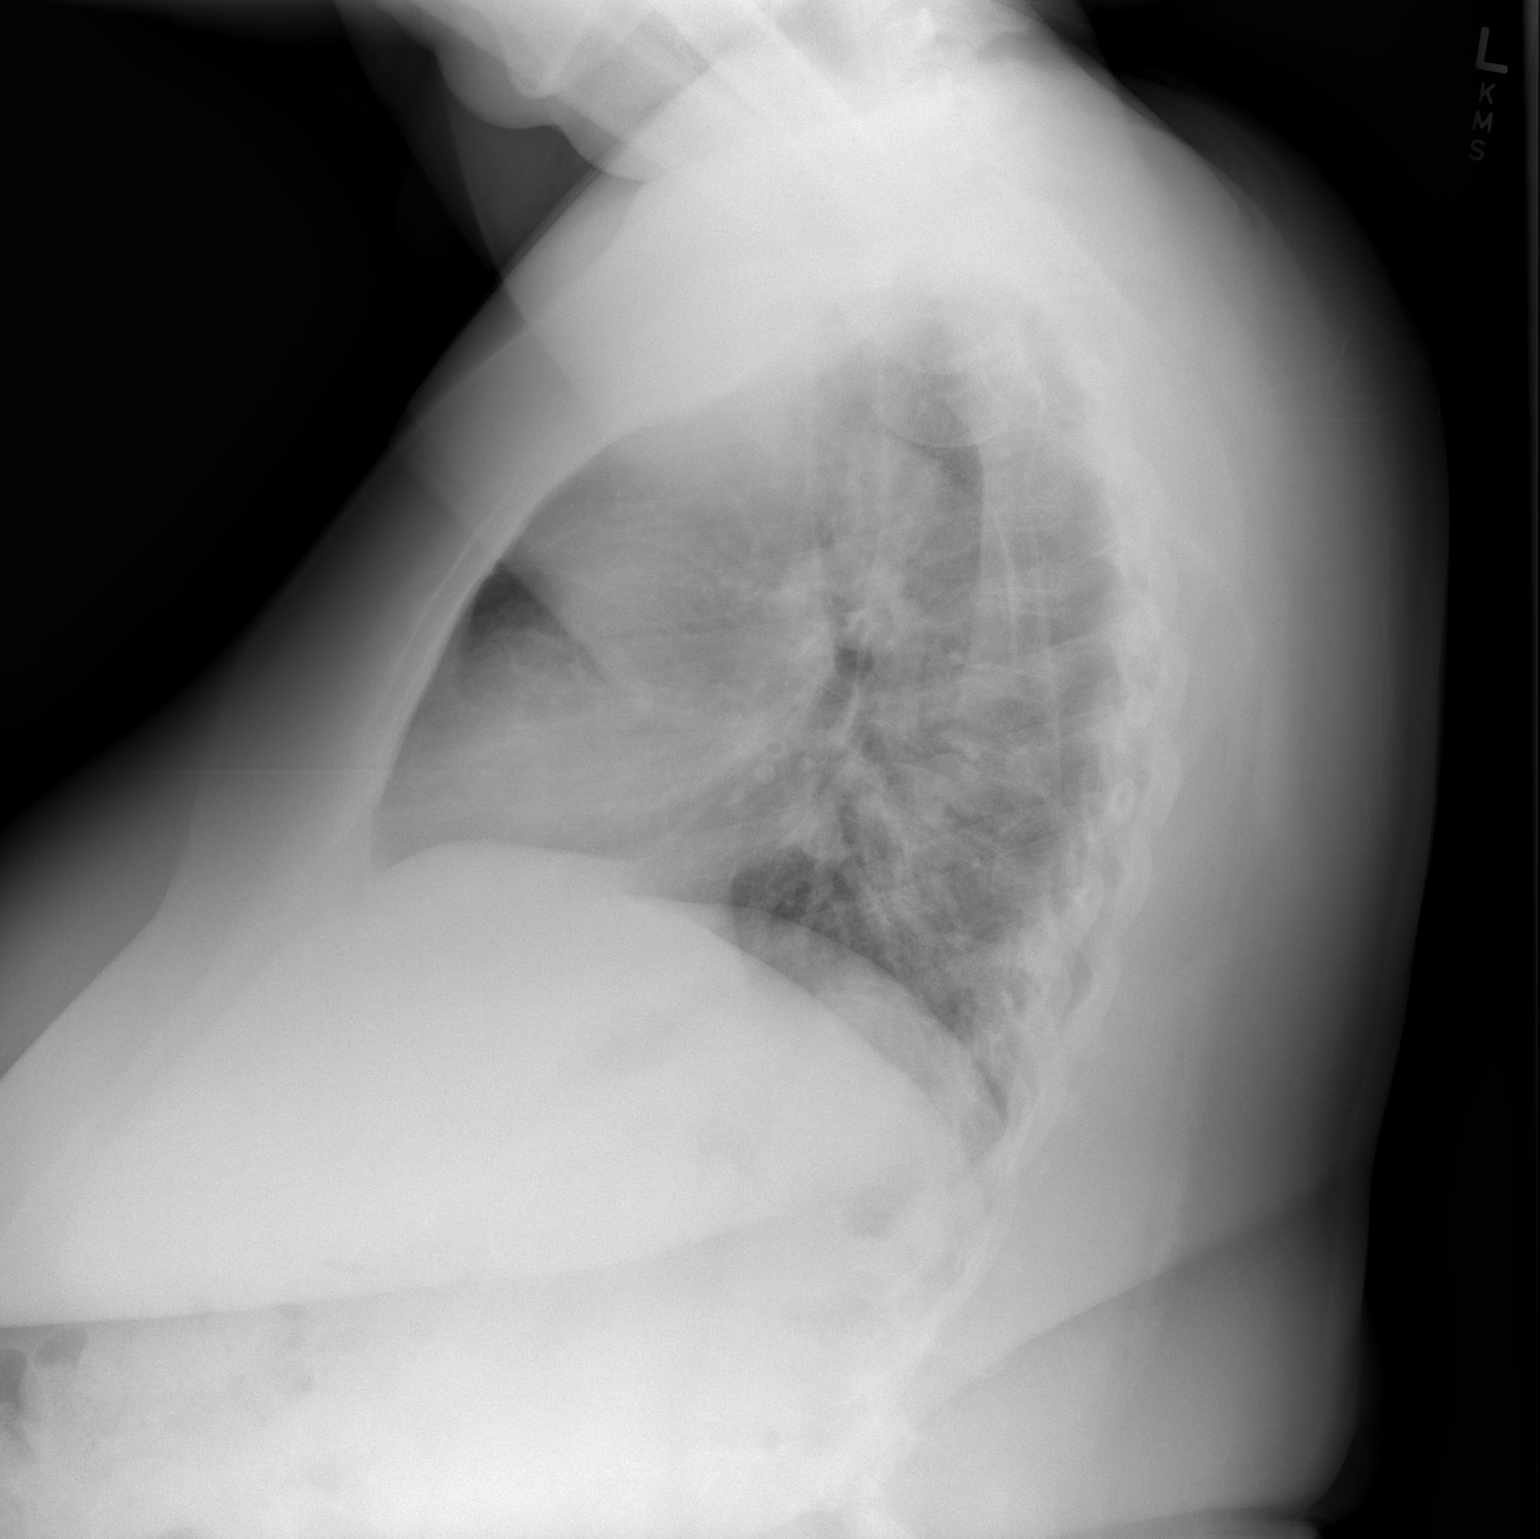

[2 of 2 positions shown; findings below may reference images not displayed]

FINDINGS: Cardiomediastinal silhouette is stable.  No acute
infiltrate or pleural effusion.  No pulmonary edema.  Stable mild
degenerative changes thoracic spine.
IMPRESSION: No active disease.  No significant change.

## 2014-03-11 ENCOUNTER — Telehealth: Payer: Self-pay

## 2014-03-11 NOTE — Telephone Encounter (Signed)
Relevant patient education assigned to patient using Emmi. ° °

## 2014-03-12 ENCOUNTER — Ambulatory Visit (INDEPENDENT_AMBULATORY_CARE_PROVIDER_SITE_OTHER): Payer: BC Managed Care – PPO | Admitting: Surgery

## 2014-03-12 ENCOUNTER — Encounter (INDEPENDENT_AMBULATORY_CARE_PROVIDER_SITE_OTHER): Payer: Self-pay | Admitting: Surgery

## 2014-03-12 VITALS — BP 123/80 | HR 77 | Temp 97.4°F | Resp 16 | Ht 61.0 in | Wt 236.8 lb

## 2014-03-12 DIAGNOSIS — L732 Hidradenitis suppurativa: Secondary | ICD-10-CM

## 2014-03-12 NOTE — Progress Notes (Signed)
Chief Complaint:  Hidradenitis of the right axilla  History of Present Illness:  [name] is an 58 y.o. female who has had problems with hidradenitis beneath her right arm. I treated her with Bactrim and with topicals and although admitted better it is continued to drain and be tender. She elected way to have something done surgically for this. Would schedule for excision of hidradenitis from her right axilla under general anesthesia cone day surgery. Will make necessary arrangements.  Past Medical History  Diagnosis Date  . Diverticulosis of colon with hemorrhage 2010, 09/2011  . Hypertension   . Personal history of colonic polyps     adenomas since 2010  . Diabetes mellitus   . Renal cell carcinoma 2004  . Blood transfusion   . IBS (irritable bowel syndrome)   . Hx: UTI (urinary tract infection)   . Anemia   . Helicobacter pylori gastritis 09/13/2013  . Blood transfusion without reported diagnosis     Past Surgical History  Procedure Laterality Date  . Nephrectomy      left  . Tubal ligation    . Knee arthroscopy      x2 left  . Hand surgery      x5 right  . Abdominal hysterectomy    . Cholecystectomy    . Upper gastrointestinal endoscopy  12/12/2008    gastroporesis  . Flexible sigmoidoscopy  12/12/2008    diverticulosis  . Colonoscopy  12/19/2008    diverticulosis  . Colonoscopy  11/17/2011    Procedure: COLONOSCOPY;  Surgeon: Zenovia Jarred, MD;  Location: [facility];  Service: Gastroenterology;  Laterality: N/A;    Current Outpatient Prescriptions  Medication Sig Dispense Refill  . hydrochlorothiazide (MICROZIDE) 12.5 MG capsule TAKE 1 CAPSULE BY MOUTH ONCE DAILY  30 capsule  3  . Insulin Glargine (LANTUS SOLOSTAR) 100 UNIT/ML Solostar Pen Inject 20 Units into the skin at bedtime.  5 pen  PRN  . metFORMIN (GLUCOPHAGE) 500 MG tablet Take 2 tablets (1,000 mg total) by mouth 2 (two) times daily with a meal.  120 tablet  11  . ondansetron (ZOFRAN-ODT) 8 MG  disintegrating tablet Take 1 tablet (8 mg total) by mouth every 8 (eight) hours as needed for nausea or vomiting.  20 tablet  0  . [DISCONTINUED] ramipril (ALTACE) 10 MG tablet Take 10 mg by mouth daily.       . [DISCONTINUED] sitaGLIPtan-metformin (JANUMET) 50-500 MG per tablet Take 1 tablet by mouth 2 (two) times daily with a meal.  60 tablet  0   No current facility-administered medications for this visit.   Review of patient's allergies indicates no known allergies. Family History  Problem Relation Age of Onset  . Colon polyps Mother   . Diabetes Maternal Grandmother   . Cancer Maternal Grandmother     breast  . Diabetes Father   . Colon cancer Neg Hx   . Cancer Daughter     breast   Social History:   reports that she has never smoked. She has never used smokeless tobacco. She reports that she does not drink alcohol or use illicit drugs.   REVIEW OF SYSTEMS - PERTINENT POSITIVES ONLY: C. Her problem list.  Physical Exam:   Blood pressure 123/80, pulse 77, temperature 97.4 F (36.3 C), resp. rate 16, height 5\' 1"  (1.549 m), weight 236 lb 12.8 oz (107.412 kg), last menstrual period 11/16/2002. Body mass index is 44.77 kg/(m^2).  Gen:  WDWN African American female NAD  Neurological: Alert and oriented  to person, place, and time. Motor and sensory function is grossly intact  Head: Normocephalic and atraumatic.  Eyes: Conjunctivae are normal. Pupils are equal, round, and reactive to light. No scleral icterus.  Neck: Normal range of motion. Neck supple. No tracheal deviation or thyromegaly present.  Cardiovascular:  SR without murmurs or gallops.  No carotid bruits Respiratory: Effort normal.  No respiratory distress. No chest wall tenderness. Breath sounds normal.  No wheezes, rales or rhonchi.  Abdomen:  nontender GU: Musculoskeletal: Normal range of motion. Extremities are nontender. No cyanosis, edema or clubbing noted Lymphadenopathy: No cervical, preauricular, postauricular  or axillary adenopathy is present Skin:  Beneath the right axilla there are multiple openings with some granulation tissue and mild purulent drainage. No erythema. This is compatible with hidradenitis involving the hairbearing area beneath her right arm.r. Pscyh: Normal mood and affect. Behavior is normal. Judgment and thought content normal.   LABORATORY RESULTS: No results found for this or any previous visit (from the past 48 hour(s)).  RADIOLOGY RESULTS: No results found.  Problem List: Patient Active Problem List   Diagnosis Date Noted  . Benign paroxysmal positional vertigo 10/03/2013  . Nausea with vomiting 10/03/2013  . Bleeding nose 10/03/2013  . Helicobacter pylori gastritis 09/13/2013  . Pyuria 08/20/2013  . Abnormal abdominal CT scan 08/20/2013  . Abdominal pain 08/16/2013  . Back pain 07/31/2013  . LUQ pain 07/31/2013  . Laceration of hand 07/10/2013  . Hidradenitis suppurativa of right axilla 05/31/2013  . Renal cell carcinoma   . IBS (irritable bowel syndrome)   . S/p nephrectomy   . 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 OBESITY, MORBID 09/16/2009  . DM (diabetes mellitus), type 2 with complications 34/74/2595  . HYPERTENSION 06/23/2009  . Personal history of colonoc adenomas 12/12/2008    Assessment & Plan: Right axillary hidradenitis. Plan surgical debridement and/or excision.    Matt B. Hassell Done, MD, [facility] [phone] beeper [phone]  03/12/2014 10:28 AM

## 2014-03-12 NOTE — Patient Instructions (Signed)
Hidradenitis Suppurativa, Sweat Gland Abscess °Hidradenitis suppurativa is a long lasting (chronic), uncommon disease of the sweat glands. With this, boil-like lumps and scarring develop in the groin, some times under the arms (axillae), and under the breasts. It may also uncommonly occur behind the ears, in the crease of the buttocks, and around the genitals.  °CAUSES  °The cause is from a blocking of the sweat glands. They then become infected. It may cause drainage and odor. It is not contagious. So it cannot be given to someone else. It most often shows up in puberty (about 10 to 57 years of age). But it may happen much later. It is similar to acne which is a disease of the sweat glands. This condition is slightly more common in African-Americans and women. °SYMPTOMS  °· Hidradenitis usually starts as one or more red, tender, swellings in the groin or under the arms (axilla). °· Over a period of hours to days the lesions get larger. They often open to the skin surface, draining clear to yellow-colored fluid. °· The infected area heals with scarring. °DIAGNOSIS  °Your caregiver makes this diagnosis by looking at you. Sometimes cultures (growing germs on plates in the lab) may be taken. This is to see what germ (bacterium) is causing the infection.  °TREATMENT  °· Topical germ killing medicine applied to the skin (antibiotics) are the treatment of choice. Antibiotics taken by mouth (systemic) are sometimes needed when the condition is getting worse or is severe. °· Avoid tight-fitting clothing which traps moisture in. °· Dirt does not cause hidradenitis and it is not caused by poor hygiene. °· Involved areas should be cleaned daily using an antibacterial soap. Some patients find that the liquid form of Lever 2000®, applied to the involved areas as a lotion after bathing, can help reduce the odor related to this condition. °· Sometimes surgery is needed to drain infected areas or remove scarred tissue. Removal of  large amounts of tissue is used only in severe cases. °· Birth control pills may be helpful. °· Oral retinoids (vitamin A derivatives) for 6 to 12 months which are effective for acne may also help this condition. °· Weight loss will improve but not cure hidradenitis. It is made worse by being overweight. But the condition is not caused by being overweight. °· This condition is more common in people who have had acne. °· It may become worse under stress. °There is no medical cure for hidradenitis. It can be controlled, but not cured. The condition usually continues for years with periods of getting worse and getting better (remission). °Document Released: 06/14/2004 Document Revised: 01/23/2012 Document Reviewed: 06/30/2008 °ExitCare® Patient Information ©2014 [employer]. ° °

## 2014-03-13 ENCOUNTER — Telehealth (INDEPENDENT_AMBULATORY_CARE_PROVIDER_SITE_OTHER): Payer: Self-pay | Admitting: Surgery

## 2014-03-13 NOTE — Telephone Encounter (Signed)
Patient met with surgery scheduling went over financial  responsibilities, will call back to schedule.

## 2014-03-19 ENCOUNTER — Encounter (HOSPITAL_BASED_OUTPATIENT_CLINIC_OR_DEPARTMENT_OTHER): Payer: Self-pay | Admitting: *Deleted

## 2014-03-19 NOTE — Pre-Procedure Instructions (Signed)
Bring all medications. Pt had EKG and BMET on 4/18 at [place]

## 2014-03-21 ENCOUNTER — Ambulatory Visit (HOSPITAL_BASED_OUTPATIENT_CLINIC_OR_DEPARTMENT_OTHER): Payer: BC Managed Care – PPO | Admitting: Anesthesiology

## 2014-03-21 ENCOUNTER — Ambulatory Visit (HOSPITAL_BASED_OUTPATIENT_CLINIC_OR_DEPARTMENT_OTHER)
Admission: RE | Admit: 2014-03-21 | Discharge: 2014-03-21 | Disposition: A | Discharge status: Home / Self Care | Payer: BC Managed Care – PPO | Source: Ambulatory Visit | Attending: Surgery | Admitting: Surgery

## 2014-03-21 ENCOUNTER — Encounter (HOSPITAL_BASED_OUTPATIENT_CLINIC_OR_DEPARTMENT_OTHER): Payer: Self-pay | Admitting: *Deleted

## 2014-03-21 ENCOUNTER — Encounter (HOSPITAL_BASED_OUTPATIENT_CLINIC_OR_DEPARTMENT_OTHER)
Admission: RE | Disposition: A | Discharge status: Home / Self Care | Payer: Self-pay | Source: Ambulatory Visit | Attending: Surgery

## 2014-03-21 ENCOUNTER — Encounter (HOSPITAL_BASED_OUTPATIENT_CLINIC_OR_DEPARTMENT_OTHER): Payer: BC Managed Care – PPO | Admitting: Anesthesiology

## 2014-03-21 DIAGNOSIS — K5730 Diverticulosis of large intestine without perforation or abscess without bleeding: Secondary | ICD-10-CM | POA: Insufficient documentation

## 2014-03-21 DIAGNOSIS — L732 Hidradenitis suppurativa: Secondary | ICD-10-CM | POA: Insufficient documentation

## 2014-03-21 DIAGNOSIS — K589 Irritable bowel syndrome without diarrhea: Secondary | ICD-10-CM | POA: Insufficient documentation

## 2014-03-21 DIAGNOSIS — I10 Essential (primary) hypertension: Secondary | ICD-10-CM | POA: Insufficient documentation

## 2014-03-21 DIAGNOSIS — E119 Type 2 diabetes mellitus without complications: Secondary | ICD-10-CM | POA: Insufficient documentation

## 2014-03-21 DIAGNOSIS — Z794 Long term (current) use of insulin: Secondary | ICD-10-CM | POA: Insufficient documentation

## 2014-03-21 DIAGNOSIS — Z803 Family history of malignant neoplasm of breast: Secondary | ICD-10-CM | POA: Insufficient documentation

## 2014-03-21 DIAGNOSIS — Z79899 Other long term (current) drug therapy: Secondary | ICD-10-CM | POA: Insufficient documentation

## 2014-03-21 DIAGNOSIS — Z85528 Personal history of other malignant neoplasm of kidney: Secondary | ICD-10-CM | POA: Insufficient documentation

## 2014-03-21 HISTORY — PX: HYDRADENITIS EXCISION: SHX5243

## 2014-03-21 HISTORY — DX: Personal history of other diseases of the digestive system: Z87.19

## 2014-03-21 LAB — POCT HEMOGLOBIN-HEMACUE: HEMOGLOBIN: 13.2 g/dL (ref 12.0–15.0)

## 2014-03-21 LAB — GLUCOSE, CAPILLARY
GLUCOSE-CAPILLARY: 151 mg/dL — AB (ref 70–99)
Glucose-Capillary: 143 mg/dL — ABNORMAL HIGH (ref 70–99)

## 2014-03-21 SURGERY — EXCISION, HIDRADENITIS, AXILLA
Anesthesia: General | Site: Axilla | Laterality: Right

## 2014-03-21 MED ORDER — SILVER SULFADIAZINE 1 % EX CREA: TOPICAL_CREAM | CUTANEOUS | Status: DC

## 2014-03-21 MED ORDER — LACTATED RINGERS IV SOLN
INTRAVENOUS | Status: DC
  Administered 2014-03-21 (×2): via INTRAVENOUS

## 2014-03-21 MED ORDER — CEFAZOLIN SODIUM-DEXTROSE 2-3 GM-% IV SOLR
2.0000 g | INTRAVENOUS | Status: AC
  Administered 2014-03-21: 2 g via INTRAVENOUS

## 2014-03-21 MED ORDER — CHLORHEXIDINE GLUCONATE 4 % EX LIQD: 1.0000 "application " | Freq: Once | CUTANEOUS | Status: DC

## 2014-03-21 MED ORDER — OXYCODONE HCL 5 MG PO TABS: 5.0000 mg | ORAL_TABLET | ORAL | Status: DC | PRN

## 2014-03-21 MED ORDER — LIDOCAINE HCL (PF) 1 % IJ SOLN
INTRAMUSCULAR | Status: AC
  Filled 2014-03-21: qty 30

## 2014-03-21 MED ORDER — HYDROCODONE-ACETAMINOPHEN 7.5-325 MG/15ML PO SOLN: 15.0000 mL | ORAL | Status: DC | PRN

## 2014-03-21 MED ORDER — CEFAZOLIN SODIUM-DEXTROSE 2-3 GM-% IV SOLR
INTRAVENOUS | Status: AC
  Filled 2014-03-21: qty 50

## 2014-03-21 MED ORDER — LIDOCAINE HCL (CARDIAC) 20 MG/ML IV SOLN
INTRAVENOUS | Status: DC | PRN
  Administered 2014-03-21: 50 mg via INTRAVENOUS

## 2014-03-21 MED ORDER — PROPOFOL 10 MG/ML IV BOLUS
INTRAVENOUS | Status: DC | PRN
  Administered 2014-03-21: 200 mg via INTRAVENOUS

## 2014-03-21 MED ORDER — SODIUM CHLORIDE 0.9 % IV SOLN: 250.0000 mL | INTRAVENOUS | Status: DC | PRN

## 2014-03-21 MED ORDER — HEPARIN SODIUM (PORCINE) 5000 UNIT/ML IJ SOLN
5000.0000 [IU] | Freq: Once | INTRAMUSCULAR | Status: DC
Start: 2014-03-21 — End: 2014-03-21

## 2014-03-21 MED ORDER — HYDROMORPHONE HCL PF 1 MG/ML IJ SOLN
0.2500 mg | INTRAMUSCULAR | Status: DC | PRN
  Administered 2014-03-21 (×2): 0.5 mg via INTRAVENOUS

## 2014-03-21 MED ORDER — BUPIVACAINE HCL (PF) 0.25 % IJ SOLN
INTRAMUSCULAR | Status: AC
  Filled 2014-03-21: qty 30

## 2014-03-21 MED ORDER — ACETAMINOPHEN 325 MG PO TABS: 650.0000 mg | ORAL_TABLET | ORAL | Status: DC | PRN

## 2014-03-21 MED ORDER — MIDAZOLAM HCL 2 MG/2ML IJ SOLN
INTRAMUSCULAR | Status: AC
  Filled 2014-03-21: qty 2

## 2014-03-21 MED ORDER — FENTANYL CITRATE 0.05 MG/ML IJ SOLN
INTRAMUSCULAR | Status: AC
  Filled 2014-03-21: qty 6

## 2014-03-21 MED ORDER — OXYCODONE HCL 5 MG PO TABS
5.0000 mg | ORAL_TABLET | Freq: Once | ORAL | Status: AC | PRN
  Administered 2014-03-21: 5 mg via ORAL

## 2014-03-21 MED ORDER — BUPIVACAINE HCL 0.5 % IJ SOLN
INTRAMUSCULAR | Status: DC | PRN
  Administered 2014-03-21: 20 mL

## 2014-03-21 MED ORDER — SILVER SULFADIAZINE 1 % EX CREA
TOPICAL_CREAM | CUTANEOUS | Status: AC
  Filled 2014-03-21: qty 85

## 2014-03-21 MED ORDER — HYDROMORPHONE HCL PF 1 MG/ML IJ SOLN
INTRAMUSCULAR | Status: AC
  Filled 2014-03-21: qty 1

## 2014-03-21 MED ORDER — FENTANYL CITRATE 0.05 MG/ML IJ SOLN: 50.0000 ug | INTRAMUSCULAR | Status: DC | PRN

## 2014-03-21 MED ORDER — SODIUM CHLORIDE 0.9 % IJ SOLN: 3.0000 mL | INTRAMUSCULAR | Status: DC | PRN

## 2014-03-21 MED ORDER — MIDAZOLAM HCL 2 MG/2ML IJ SOLN: 1.0000 mg | INTRAMUSCULAR | Status: DC | PRN

## 2014-03-21 MED ORDER — FENTANYL CITRATE 0.05 MG/ML IJ SOLN
INTRAMUSCULAR | Status: DC | PRN
Start: 2014-03-21 — End: 2014-03-21
  Administered 2014-03-21 (×2): 25 ug via INTRAVENOUS

## 2014-03-21 MED ORDER — ACETAMINOPHEN 650 MG RE SUPP: 650.0000 mg | RECTAL | Status: DC | PRN

## 2014-03-21 MED ORDER — SODIUM CHLORIDE 0.9 % IJ SOLN: 3.0000 mL | Freq: Two times a day (BID) | INTRAMUSCULAR | Status: DC

## 2014-03-21 MED ORDER — MIDAZOLAM HCL 5 MG/5ML IJ SOLN
INTRAMUSCULAR | Status: DC | PRN
  Administered 2014-03-21: 2 mg via INTRAVENOUS

## 2014-03-21 MED ORDER — ONDANSETRON HCL 4 MG/2ML IJ SOLN: 4.0000 mg | Freq: Four times a day (QID) | INTRAMUSCULAR | Status: DC | PRN

## 2014-03-21 MED ORDER — OXYCODONE HCL 5 MG/5ML PO SOLN: 5.0000 mg | Freq: Once | ORAL | Status: AC | PRN

## 2014-03-21 MED ORDER — DEXAMETHASONE SODIUM PHOSPHATE 4 MG/ML IJ SOLN
INTRAMUSCULAR | Status: DC | PRN
  Administered 2014-03-21: 10 mg via INTRAVENOUS

## 2014-03-21 MED ORDER — SILVER SULFADIAZINE 1 % EX CREA
TOPICAL_CREAM | CUTANEOUS | Status: DC | PRN
  Administered 2014-03-21: 1 via TOPICAL

## 2014-03-21 MED ORDER — BUPIVACAINE-EPINEPHRINE (PF) 0.5% -1:200000 IJ SOLN
INTRAMUSCULAR | Status: AC
  Filled 2014-03-21: qty 30

## 2014-03-21 MED ORDER — OXYCODONE HCL 5 MG PO TABS
ORAL_TABLET | ORAL | Status: AC
  Filled 2014-03-21: qty 1

## 2014-03-21 SURGICAL SUPPLY — 46 items
APL SKNCLS STERI-STRIP NONHPOA (GAUZE/BANDAGES/DRESSINGS)
BANDAGE ELASTIC 4 VELCRO ST LF (GAUZE/BANDAGES/DRESSINGS) ×1 IMPLANT
BENZOIN TINCTURE PRP APPL 2/3 (GAUZE/BANDAGES/DRESSINGS) IMPLANT
BLADE 15 SAFETY STRL DISP (BLADE) IMPLANT
BLADE SURG 15 STRL LF DISP TIS (BLADE) IMPLANT
BLADE SURG 15 STRL SS (BLADE) ×2
BNDG GAUZE ELAST 4 BULKY (GAUZE/BANDAGES/DRESSINGS) ×2 IMPLANT
CANISTER SUCT 1200ML W/VALVE (MISCELLANEOUS) ×2 IMPLANT
CLEANER CAUTERY TIP 5X5 PAD (MISCELLANEOUS) IMPLANT
COVER MAYO STAND STRL (DRAPES) ×2 IMPLANT
COVER TABLE BACK 60X90 (DRAPES) ×2 IMPLANT
DECANTER SPIKE VIAL GLASS SM (MISCELLANEOUS) IMPLANT
DRAPE PED LAPAROTOMY (DRAPES) ×2 IMPLANT
ELECT COATED BLADE 2.86 ST (ELECTRODE) ×2 IMPLANT
ELECT REM PT RETURN 9FT ADLT (ELECTROSURGICAL) ×2
ELECTRODE REM PT RTRN 9FT ADLT (ELECTROSURGICAL) ×1 IMPLANT
GAUZE SPONGE 4X4 16PLY XRAY LF (GAUZE/BANDAGES/DRESSINGS) IMPLANT
GLOVE BIO SURGEON STRL SZ8 (GLOVE) ×2 IMPLANT
GLOVE BIOGEL PI IND STRL 6.5 (GLOVE) ×1 IMPLANT
GLOVE BIOGEL PI INDICATOR 6.5 (GLOVE) ×1
GLOVE ECLIPSE 6.5 STRL STRAW (GLOVE) ×2 IMPLANT
GOWN STRL REUS W/ TWL LRG LVL3 (GOWN DISPOSABLE) ×1 IMPLANT
GOWN STRL REUS W/ TWL XL LVL3 (GOWN DISPOSABLE) ×1 IMPLANT
GOWN STRL REUS W/TWL LRG LVL3 (GOWN DISPOSABLE) ×2
GOWN STRL REUS W/TWL XL LVL3 (GOWN DISPOSABLE) ×2
NEEDLE HYPO 25X1 1.5 SAFETY (NEEDLE) ×2 IMPLANT
PACK BASIN DAY SURGERY FS (CUSTOM PROCEDURE TRAY) ×2 IMPLANT
PAD CLEANER CAUTERY TIP 5X5 (MISCELLANEOUS)
PENCIL BUTTON HOLSTER BLD 10FT (ELECTRODE) ×2 IMPLANT
SHEET MEDIUM DRAPE 40X70 STRL (DRAPES) ×2 IMPLANT
SLEEVE SCD COMPRESS KNEE MED (MISCELLANEOUS) ×2 IMPLANT
SPONGE GAUZE 4X4 12PLY STER LF (GAUZE/BANDAGES/DRESSINGS) ×1 IMPLANT
SPONGE LAP 4X18 X RAY DECT (DISPOSABLE) ×2 IMPLANT
STRIP CLOSURE SKIN 1/2X4 (GAUZE/BANDAGES/DRESSINGS) IMPLANT
SUT PROLENE 4 0 PS 2 18 (SUTURE) IMPLANT
SUT VIC AB 4-0 SH 18 (SUTURE) IMPLANT
SUT VIC AB 4-0 SH 27 (SUTURE)
SUT VIC AB 4-0 SH 27XANBCTRL (SUTURE) IMPLANT
SUT VIC AB 5-0 P-3 18X BRD (SUTURE) ×1 IMPLANT
SUT VIC AB 5-0 P3 18 (SUTURE) ×2
SYR BULB 3OZ (MISCELLANEOUS) ×2 IMPLANT
SYR CONTROL 10ML LL (SYRINGE) ×2 IMPLANT
TOWEL OR 17X24 6PK STRL BLUE (TOWEL DISPOSABLE) ×2 IMPLANT
TRAY DSU PREP LF (CUSTOM PROCEDURE TRAY) ×2 IMPLANT
TUBE CONNECTING 20X1/4 (TUBING) ×2 IMPLANT
YANKAUER SUCT BULB TIP NO VENT (SUCTIONS) ×2 IMPLANT

## 2014-03-21 NOTE — Anesthesia Postprocedure Evaluation (Signed)
Anesthesia Post Note  Patient: [name]  Procedure(s) Performed: Procedure(s) (LRB): EXCISION HIDRADENITIS AXILLA (Right)  Anesthesia type: General  Patient location: PACU  Post pain: Pain level controlled and Adequate analgesia  Post assessment: Post-op Vital signs reviewed, Patient's Cardiovascular Status Stable, Respiratory Function Stable, Patent Airway and Pain level controlled  Last Vitals:  Filed Vitals:   03/21/14 1307  BP: 176/83  Pulse: 92  Temp: 36.7 C  Resp: 16    Post vital signs: Reviewed and stable  Level of consciousness: awake, alert  and oriented  Complications: No apparent anesthesia complications

## 2014-03-21 NOTE — H&P (View-Only) (Signed)
Chief Complaint:  Hidradenitis of the right axilla  History of Present Illness:  [name] is an 58 y.o. female who has had problems with hidradenitis beneath her right arm. I treated her with Bactrim and with topicals and although admitted better it is continued to drain and be tender. She elected way to have something done surgically for this. Would schedule for excision of hidradenitis from her right axilla under general anesthesia cone day surgery. Will make necessary arrangements.  Past Medical History  Diagnosis Date  . Diverticulosis of colon with hemorrhage 2010, 09/2011  . Hypertension   . Personal history of colonic polyps     adenomas since 2010  . Diabetes mellitus   . Renal cell carcinoma 2004  . Blood transfusion   . IBS (irritable bowel syndrome)   . Hx: UTI (urinary tract infection)   . Anemia   . Helicobacter pylori gastritis 09/13/2013  . Blood transfusion without reported diagnosis     Past Surgical History  Procedure Laterality Date  . Nephrectomy      left  . Tubal ligation    . Knee arthroscopy      x2 left  . Hand surgery      x5 right  . Abdominal hysterectomy    . Cholecystectomy    . Upper gastrointestinal endoscopy  12/12/2008    gastroporesis  . Flexible sigmoidoscopy  12/12/2008    diverticulosis  . Colonoscopy  12/19/2008    diverticulosis  . Colonoscopy  11/17/2011    Procedure: COLONOSCOPY;  Surgeon: Zenovia Jarred, MD;  Location: [facility];  Service: Gastroenterology;  Laterality: N/A;    Current Outpatient Prescriptions  Medication Sig Dispense Refill  . hydrochlorothiazide (MICROZIDE) 12.5 MG capsule TAKE 1 CAPSULE BY MOUTH ONCE DAILY  30 capsule  3  . Insulin Glargine (LANTUS SOLOSTAR) 100 UNIT/ML Solostar Pen Inject 20 Units into the skin at bedtime.  5 pen  PRN  . metFORMIN (GLUCOPHAGE) 500 MG tablet Take 2 tablets (1,000 mg total) by mouth 2 (two) times daily with a meal.  120 tablet  11  . ondansetron (ZOFRAN-ODT) 8 MG  disintegrating tablet Take 1 tablet (8 mg total) by mouth every 8 (eight) hours as needed for nausea or vomiting.  20 tablet  0  . [DISCONTINUED] ramipril (ALTACE) 10 MG tablet Take 10 mg by mouth daily.       . [DISCONTINUED] sitaGLIPtan-metformin (JANUMET) 50-500 MG per tablet Take 1 tablet by mouth 2 (two) times daily with a meal.  60 tablet  0   No current facility-administered medications for this visit.   Review of patient's allergies indicates no known allergies. Family History  Problem Relation Age of Onset  . Colon polyps Mother   . Diabetes Maternal Grandmother   . Cancer Maternal Grandmother     breast  . Diabetes Father   . Colon cancer Neg Hx   . Cancer Daughter     breast   Social History:   reports that she has never smoked. She has never used smokeless tobacco. She reports that she does not drink alcohol or use illicit drugs.   REVIEW OF SYSTEMS - PERTINENT POSITIVES ONLY: C. Her problem list.  Physical Exam:   Blood pressure 123/80, pulse 77, temperature 97.4 F (36.3 C), resp. rate 16, height 5\' 1"  (1.549 m), weight 236 lb 12.8 oz (107.412 kg), last menstrual period 11/16/2002. Body mass index is 44.77 kg/(m^2).  Gen:  WDWN African American female NAD  Neurological: Alert and oriented  to person, place, and time. Motor and sensory function is grossly intact  Head: Normocephalic and atraumatic.  Eyes: Conjunctivae are normal. Pupils are equal, round, and reactive to light. No scleral icterus.  Neck: Normal range of motion. Neck supple. No tracheal deviation or thyromegaly present.  Cardiovascular:  SR without murmurs or gallops.  No carotid bruits Respiratory: Effort normal.  No respiratory distress. No chest wall tenderness. Breath sounds normal.  No wheezes, rales or rhonchi.  Abdomen:  nontender GU: Musculoskeletal: Normal range of motion. Extremities are nontender. No cyanosis, edema or clubbing noted Lymphadenopathy: No cervical, preauricular, postauricular  or axillary adenopathy is present Skin:  Beneath the right axilla there are multiple openings with some granulation tissue and mild purulent drainage. No erythema. This is compatible with hidradenitis involving the hairbearing area beneath her right arm.r. Pscyh: Normal mood and affect. Behavior is normal. Judgment and thought content normal.   LABORATORY RESULTS: No results found for this or any previous visit (from the past 48 hour(s)).  RADIOLOGY RESULTS: No results found.  Problem List: Patient Active Problem List   Diagnosis Date Noted  . Benign paroxysmal positional vertigo 10/03/2013  . Nausea with vomiting 10/03/2013  . Bleeding nose 10/03/2013  . Helicobacter pylori gastritis 09/13/2013  . Pyuria 08/20/2013  . Abnormal abdominal CT scan 08/20/2013  . Abdominal pain 08/16/2013  . Back pain 07/31/2013  . LUQ pain 07/31/2013  . Laceration of hand 07/10/2013  . Hidradenitis suppurativa of right axilla 05/31/2013  . Renal cell carcinoma   . IBS (irritable bowel syndrome)   . S/p nephrectomy   . 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 OBESITY, MORBID 09/16/2009  . DM (diabetes mellitus), type 2 with complications 34/74/2595  . HYPERTENSION 06/23/2009  . Personal history of colonoc adenomas 12/12/2008    Assessment & Plan: Right axillary hidradenitis. Plan surgical debridement and/or excision.    Matt B. Hassell Done, MD, [facility] [phone] beeper [phone]  03/12/2014 10:28 AM

## 2014-03-21 NOTE — Anesthesia Preprocedure Evaluation (Signed)
Anesthesia Evaluation  Patient identified by MRN, date of birth, ID band Patient awake    Reviewed: Allergy & Precautions, H&P , NPO status , Patient's Chart, lab work & pertinent test results  Airway Mallampati: II  Neck ROM: full    Dental   Pulmonary  breath sounds clear to auscultation        Cardiovascular hypertension, Rhythm:regular Rate:Normal     Neuro/Psych    GI/Hepatic hiatal hernia, IBS   Endo/Other  diabetes, Type 2Morbid obesity  Renal/GU      Musculoskeletal   Abdominal   Peds  Hematology   Anesthesia Other Findings   Reproductive/Obstetrics                           Anesthesia Physical Anesthesia Plan  ASA: II  Anesthesia Plan: General   Post-op Pain Management:    Induction: Intravenous  Airway Management Planned: LMA  Additional Equipment:   Intra-op Plan:   Post-operative Plan:   Informed Consent: I have reviewed the patients History and Physical, chart, labs and discussed the procedure including the risks, benefits and alternatives for the proposed anesthesia with the patient or authorized representative who has indicated his/her understanding and acceptance.     Plan Discussed with: CRNA, Anesthesiologist and Surgeon  Anesthesia Plan Comments:         Anesthesia Quick Evaluation

## 2014-03-21 NOTE — Transfer of Care (Signed)
Immediate Anesthesia Transfer of Care Note  Patient: [name]  Procedure(s) Performed: Procedure(s): EXCISION HIDRADENITIS AXILLA (Right)  Patient Location: PACU  Anesthesia Type:General  Level of Consciousness: awake, alert  and oriented  Airway & Oxygen Therapy: Patient Spontanous Breathing and Patient connected to face mask oxygen  Post-op Assessment: Report given to PACU RN and Post -op Vital signs reviewed and stable  Post vital signs: Reviewed and stable  Complications: No apparent anesthesia complications

## 2014-03-21 NOTE — Anesthesia Procedure Notes (Signed)
Procedure Name: LMA Insertion Date/Time: 03/21/2014 10:34 AM Performed by: Melynda Ripple D Pre-anesthesia Checklist: Patient identified, Emergency Drugs available, Suction available and Patient being monitored Patient Re-evaluated:Patient Re-evaluated prior to inductionOxygen Delivery Method: Circle System Utilized Preoxygenation: Pre-oxygenation with 100% oxygen Intubation Type: IV induction Ventilation: Mask ventilation without difficulty LMA: LMA inserted LMA Size: 4.0 Number of attempts: 1 Airway Equipment and Method: bite block Placement Confirmation: positive ETCO2 Tube secured with: Tape Dental Injury: Teeth and Oropharynx as per pre-operative assessment

## 2014-03-21 NOTE — Op Note (Signed)
Surgeon: Kaylyn Lim, MD, FACS  Asst:  none  Anes:  General by LMA  Procedure: Excision of multiple (4) sites of hidradenitis of the right axilla  Diagnosis: Hidradenitis suppurativa  Complications: none  EBL:   8 cc  Description of Procedure:  With the patient in the supine position and under general anesthesia, and after a fime out was performed, I performed an exam of the axilla on the right.  There was an opening on the proximal inner arm that communicated with a long granulomatous line of chronic inflammation.  This tract was opened and the base cauterized with the bovie.  Moving inferiorally in the axilla there were three more areas of chronic induration and inflammation that were opened and cauterized.  The area was injected with 0.5 % marcaine and dressed with silvadene cream.    [name] Done, [place], [facility], [place]

## 2014-03-21 NOTE — Discharge Instructions (Signed)
Silver Sulfadiazine skin cream What is this medicine? SILVER SULFADIAZINE (SIL ver sul fa DYE a zeen) is a sulfonamide antibiotic. It is used on the skin for second or third degree burns. It helps to prevent or treat serious infection. This medicine may be used for other purposes; ask your health care provider or pharmacist if you have questions. COMMON BRAND NAME(S): Silvadene, SSD AF, SSD, Thermazene What should I tell my health care provider before I take this medicine? They need to know if you have any of these conditions: -anemia or other blood disorders -glucose-6-phosphate dehydrogenase (G6PD) deficiency -kidney disease -liver disease -porphyria -an unusual or allergic reaction to silver sulfadiazine, sulfa drugs, other medicines, foods, dyes, or preservatives -pregnant or trying to get pregnant -breast-feeding How should I use this medicine? This medicine is for external use only. Follow the directions on the prescription label. Clean the affected area and remove burned or dead skin. Wear a sterile glove to apply the cream. Apply the cream to cover the whole area evenly. Treated areas can be left uncovered, but a gauze dressing may be used. Do not get this medicine in your eyes. If you do, rinse out with plenty of cool tap water. Finish the full course of medicine prescribed by your doctor or health care professional even if you think your condition is better. Do not stop using except on your doctor's advice. Talk to your pediatrician regarding the use of this medicine in children. Special care may be needed. Overdosage: If you think you have taken too much of this medicine contact a poison control center or emergency room at once. NOTE: This medicine is only for you. Do not share this medicine with others. What if I miss a dose? If you miss a dose, use it as soon as you can. If it is almost time for your next dose, use only that dose. Do not use double or extra doses. What may interact  with this medicine? -collagenase, papain, or sutilains This list may not describe all possible interactions. Give your health care provider a list of all the medicines, herbs, non-prescription drugs, or dietary supplements you use. Also tell them if you smoke, drink alcohol, or use illegal drugs. Some items may interact with your medicine. What should I watch for while using this medicine? Tell your doctor or health care professional if your skin condition does not begin to get better within 3 to 5 days. This medicine can make you more sensitive to the sun. Keep out of the sun. If you cannot avoid being in the sun, wear protective clothing and use sunscreen. Do not use sun lamps or tanning beds/booths. What side effects may I notice from receiving this medicine? Side effects that you should report to your doctor or health care professional as soon as possible: -fever, sore throat, chills -increased sensitivity to the sun or ultraviolet light -lower back pain -pain or difficulty passing urine -rash that appears or worsens following treatment, continued redness, swelling, burning, itching, stinging, or pain at the area of use -redness, blistering, peeling or loosening of the skin -unusual bleeding or bruising Side effects that usually do not require medical attention (report to your doctor or health care professional if they continue or are bothersome): -brownish gray discoloration of skin, nails or clothing -itching This list may not describe all possible side effects. Call your doctor for medical advice about side effects. You may report side effects to FDA at 1-800-FDA-1088. Where should I keep my medicine? Keep  out of the reach of children. Store at room temperature between 15 and 30 degrees C (59 and 86 degrees F). Throw away any unused medicine after the expiration date. NOTE: This sheet is a summary. It may not cover all possible information. If you have questions about this medicine, talk  to your doctor, pharmacist, or health care provider.  2014, Elsevier/Gold Standard. (2008-07-02 15:26:44)   Change dressing daily at time of shower.  May shower with Dial soap and air dry your underarm with hairdryer on low.  Apply silvadene cream to open areas and redress with gauze.      Post Anesthesia Home Care Instructions  Activity: Get plenty of rest for the remainder of the day. A responsible adult should stay with you for 24 hours following the procedure.  For the next 24 hours, DO NOT: -Drive a car -Paediatric nurse -Drink alcoholic beverages -Take any medication unless instructed by your physician -Make any legal decisions or sign important papers.  Meals: Start with liquid foods such as gelatin or soup. Progress to regular foods as tolerated. Avoid greasy, spicy, heavy foods. If nausea and/or vomiting occur, drink only clear liquids until the nausea and/or vomiting subsides. Call your physician if vomiting continues.  Special Instructions/Symptoms: Your throat may feel dry or sore from the anesthesia or the breathing tube placed in your throat during surgery. If this causes discomfort, gargle with warm salt water. The discomfort should disappear within 24 hours.

## 2014-03-21 NOTE — Interval H&P Note (Signed)
History and Physical Interval Note:  03/21/2014 10:21 AM  [name]  has presented today for surgery, with the diagnosis of right axillary hidradenitis  The various methods of treatment have been discussed with the patient and family. After consideration of risks, benefits and other options for treatment, the patient has consented to  Procedure(s): EXCISION HIDRADENITIS AXILLA (Right) as a surgical intervention .  The patient's history has been reviewed, patient examined, no change in status, stable for surgery.  I have reviewed the patient's chart and labs.  Questions were answered to the patient's satisfaction.     Pedro Earls

## 2014-03-22 ENCOUNTER — Encounter (HOSPITAL_COMMUNITY): Payer: Self-pay | Admitting: Emergency Medicine

## 2014-03-22 ENCOUNTER — Emergency Department (HOSPITAL_COMMUNITY)
Admission: EM | Admit: 2014-03-22 | Discharge: 2014-03-22 | Disposition: A | Discharge status: Home / Self Care | Payer: BC Managed Care – PPO | Attending: Emergency Medicine | Admitting: Emergency Medicine

## 2014-03-22 DIAGNOSIS — Z862 Personal history of diseases of the blood and blood-forming organs and certain disorders involving the immune mechanism: Secondary | ICD-10-CM | POA: Insufficient documentation

## 2014-03-22 DIAGNOSIS — Z8619 Personal history of other infectious and parasitic diseases: Secondary | ICD-10-CM | POA: Insufficient documentation

## 2014-03-22 DIAGNOSIS — Z794 Long term (current) use of insulin: Secondary | ICD-10-CM | POA: Insufficient documentation

## 2014-03-22 DIAGNOSIS — Z860100 Personal history of colon polyps, unspecified: Secondary | ICD-10-CM | POA: Insufficient documentation

## 2014-03-22 DIAGNOSIS — L732 Hidradenitis suppurativa: Secondary | ICD-10-CM | POA: Insufficient documentation

## 2014-03-22 DIAGNOSIS — Z87440 Personal history of urinary (tract) infections: Secondary | ICD-10-CM | POA: Insufficient documentation

## 2014-03-22 DIAGNOSIS — Z85528 Personal history of other malignant neoplasm of kidney: Secondary | ICD-10-CM | POA: Insufficient documentation

## 2014-03-22 DIAGNOSIS — Z4801 Encounter for change or removal of surgical wound dressing: Secondary | ICD-10-CM | POA: Insufficient documentation

## 2014-03-22 DIAGNOSIS — I10 Essential (primary) hypertension: Secondary | ICD-10-CM | POA: Insufficient documentation

## 2014-03-22 DIAGNOSIS — Z79899 Other long term (current) drug therapy: Secondary | ICD-10-CM | POA: Insufficient documentation

## 2014-03-22 DIAGNOSIS — E119 Type 2 diabetes mellitus without complications: Secondary | ICD-10-CM | POA: Insufficient documentation

## 2014-03-22 DIAGNOSIS — Z8719 Personal history of other diseases of the digestive system: Secondary | ICD-10-CM | POA: Insufficient documentation

## 2014-03-22 DIAGNOSIS — Z5189 Encounter for other specified aftercare: Secondary | ICD-10-CM

## 2014-03-22 LAB — PROTIME-INR
INR: 1 (ref 0.00–1.49)
PROTHROMBIN TIME: 13 s (ref 11.6–15.2)

## 2014-03-22 LAB — CBC WITH DIFFERENTIAL/PLATELET
BASOS ABS: 0 10*3/uL (ref 0.0–0.1)
Basophils Relative: 0 % (ref 0–1)
Eosinophils Absolute: 0 10*3/uL (ref 0.0–0.7)
Eosinophils Relative: 0 % (ref 0–5)
HEMATOCRIT: 39 % (ref 36.0–46.0)
HEMOGLOBIN: 12.5 g/dL (ref 12.0–15.0)
Lymphocytes Relative: 21 % (ref 12–46)
Lymphs Abs: 3.1 10*3/uL (ref 0.7–4.0)
MCH: 28 pg (ref 26.0–34.0)
MCHC: 32.1 g/dL (ref 30.0–36.0)
MCV: 87.2 fL (ref 78.0–100.0)
MONO ABS: 0.9 10*3/uL (ref 0.1–1.0)
Monocytes Relative: 6 % (ref 3–12)
NEUTROS ABS: 10.7 10*3/uL — AB (ref 1.7–7.7)
NEUTROS PCT: 73 % (ref 43–77)
Platelets: 270 10*3/uL (ref 150–400)
RBC: 4.47 MIL/uL (ref 3.87–5.11)
RDW: 14.8 % (ref 11.5–15.5)
WBC: 14.7 10*3/uL — ABNORMAL HIGH (ref 4.0–10.5)

## 2014-03-22 LAB — BASIC METABOLIC PANEL
BUN: 21 mg/dL (ref 6–23)
CO2: 26 meq/L (ref 19–32)
CREATININE: 1 mg/dL (ref 0.50–1.10)
Calcium: 9.6 mg/dL (ref 8.4–10.5)
Chloride: 99 mEq/L (ref 96–112)
GFR calc Af Amer: 72 mL/min — ABNORMAL LOW (ref >90)
GFR calc non Af Amer: 62 mL/min — ABNORMAL LOW (ref >90)
Glucose, Bld: 158 mg/dL — ABNORMAL HIGH (ref 70–99)
POTASSIUM: 4.1 meq/L (ref 3.7–5.3)
Sodium: 138 mEq/L (ref 137–147)

## 2014-03-22 LAB — APTT: APTT: 32 s (ref 24–37)

## 2014-03-22 MED ORDER — HYDROCODONE-ACETAMINOPHEN 5-325 MG PO TABS
1.0000 | ORAL_TABLET | Freq: Once | ORAL | Status: AC
  Administered 2014-03-22: 1 via ORAL
  Filled 2014-03-22: qty 1

## 2014-03-22 NOTE — ED Notes (Signed)
Patient presents stating that she has same day surgery yesterday (Friday) at [facility].  Had a large abcess under her right arm.  Felt the dressing getting damp from the drainage but noticed the blood on the dressing about 4am

## 2014-03-22 NOTE — Discharge Instructions (Signed)
Please follow with your primary care doctor in the next 2 days for a check-up. They must obtain records for further management.   Do not hesitate to return to the Emergency Department for any new, worsening or concerning symptoms.    Hidradenitis Suppurativa, Sweat Gland Abscess Hidradenitis suppurativa is a long lasting (chronic), uncommon disease of the sweat glands. With this, boil-like lumps and scarring develop in the groin, some times under the arms (axillae), and under the breasts. It may also uncommonly occur behind the ears, in the crease of the buttocks, and around the genitals.  CAUSES  The cause is from a blocking of the sweat glands. They then become infected. It may cause drainage and odor. It is not contagious. So it cannot be given to someone else. It most often shows up in puberty (about 20 to 57 years of age). But it may happen much later. It is similar to acne which is a disease of the sweat glands. This condition is slightly more common in African-Americans and women. SYMPTOMS   Hidradenitis usually starts as one or more red, tender, swellings in the groin or under the arms (axilla).  Over a period of hours to days the lesions get larger. They often open to the skin surface, draining clear to yellow-colored fluid.  The infected area heals with scarring. DIAGNOSIS  Your caregiver makes this diagnosis by looking at you. Sometimes cultures (growing germs on plates in the lab) may be taken. This is to see what germ (bacterium) is causing the infection.  TREATMENT   Topical germ killing medicine applied to the skin (antibiotics) are the treatment of choice. Antibiotics taken by mouth (systemic) are sometimes needed when the condition is getting worse or is severe.  Avoid tight-fitting clothing which traps moisture in.  Dirt does not cause hidradenitis and it is not caused by poor hygiene.  Involved areas should be cleaned daily using an antibacterial soap. Some patients find  that the liquid form of Lever 2000, applied to the involved areas as a lotion after bathing, can help reduce the odor related to this condition.  Sometimes surgery is needed to drain infected areas or remove scarred tissue. Removal of large amounts of tissue is used only in severe cases.  Birth control pills may be helpful.  Oral retinoids (vitamin A derivatives) for 6 to 12 months which are effective for acne may also help this condition.  Weight loss will improve but not cure hidradenitis. It is made worse by being overweight. But the condition is not caused by being overweight.  This condition is more common in people who have had acne.  It may become worse under stress. There is no medical cure for hidradenitis. It can be controlled, but not cured. The condition usually continues for years with periods of getting worse and getting better (remission). Document Released: 06/14/2004 Document Revised: 01/23/2012 Document Reviewed: 06/30/2008 [facility] Patient Information 2014 [place].  Wound Check Your wound appears healthy today. Your wound will heal gradually over time. Eventually a scar will form that will fade with time. FACTORS THAT AFFECT SCAR FORMATION:  People differ in the severity in which they scar.  Scar severity varies according to location, size, and the traits you inherited from your parents (genetic predisposition).  Irritation to the wound from infection, rubbing, or chemical exposure will increase the amount of scar formation. HOME CARE INSTRUCTIONS   If you were given a dressing, you should change it at least once a day or as  instructed by your caregiver. If the bandage sticks, soak it off with a solution of hydrogen peroxide.  If the bandage becomes wet, dirty, or develops a bad smell, change it as soon as possible.  Look for signs of infection.  Only take over-the-counter or prescription medicines for pain, discomfort, or fever as directed by your  caregiver. SEEK IMMEDIATE MEDICAL CARE IF:   You have redness, swelling, or increasing pain in the wound.  You notice pus coming from the wound.  You have a fever.  You notice a bad smell coming from the wound or dressing. Document Released: 08/06/2004 Document Revised: 01/23/2012 Document Reviewed: 10/31/2005 [facility] Patient Information 2014 [place].

## 2014-03-22 NOTE — ED Provider Notes (Signed)
Medical screening examination/treatment/procedure(s) were performed by non-physician practitioner and as supervising physician I was immediately available for consultation/collaboration.   EKG Interpretation None       Leota Jacobsen, MD 03/22/14 [phone]

## 2014-03-22 NOTE — ED Provider Notes (Signed)
CSN: [id number]     Arrival date & time 03/22/14  0532 History   None    Chief Complaint  Patient presents with  . Post-op Problem     (Consider location/radiation/quality/duration/timing/severity/associated sxs/prior Treatment) HPI  [name] is a 58 y.o. female complaining of Patient had excision of a right axillary hidradenitis super T. for by Dr. Hassell Done yesterday: She is complaining of bloody dressing which he noticed today about 4 AM. Denies fever, N/V, rash, spreading redness.   Past Medical History  Diagnosis Date  . Diverticulosis of colon with hemorrhage 2010, 09/2011  . Hypertension   . Personal history of colonic polyps     adenomas since 2010  . Diabetes mellitus   . Renal cell carcinoma 2004  . Blood transfusion   . IBS (irritable bowel syndrome)   . Hx: UTI (urinary tract infection)   . Anemia   . Helicobacter pylori gastritis 09/13/2013  . Blood transfusion without reported diagnosis   . H/O hiatal hernia    Past Surgical History  Procedure Laterality Date  . Nephrectomy      left  . Tubal ligation    . Knee arthroscopy      x2 left  . Hand surgery      x5 right  . Abdominal hysterectomy    . Cholecystectomy    . Upper gastrointestinal endoscopy  12/12/2008    gastroporesis  . Flexible sigmoidoscopy  12/12/2008    diverticulosis  . Colonoscopy  12/19/2008    diverticulosis  . Colonoscopy  11/17/2011    Procedure: COLONOSCOPY;  Surgeon: Zenovia Jarred, MD;  Location: [facility];  Service: Gastroenterology;  Laterality: N/A;   Family History  Problem Relation Age of Onset  . Colon polyps Mother   . Diabetes Maternal Grandmother   . Cancer Maternal Grandmother     breast  . Diabetes Father   . Colon cancer Neg Hx   . Cancer Daughter     breast   History  Substance Use Topics  . Smoking status: Never Smoker   . Smokeless tobacco: Never Used  . Alcohol Use: No   OB History   Grav Para Term Preterm Abortions TAB SAB Ect Mult Living           Review of Systems  10 systems reviewed and found to be negative, except as noted in the HPI.  Allergies  Review of patient's allergies indicates no known allergies.  Home Medications   Prior to Admission medications   Medication Sig Start Date End Date Taking? Authorizing Provider  albuterol (PROVENTIL) (2.5 MG/3ML) 0.083% nebulizer solution Take 2.5 mg by nebulization every 6 (six) hours as needed for wheezing or shortness of breath.    Historical Provider, MD  hydrochlorothiazide (MICROZIDE) 12.5 MG capsule TAKE 1 CAPSULE BY MOUTH ONCE DAILY 02/24/14   Midge Minium, MD  HYDROcodone-acetaminophen (HYCET) 7.5-325 mg/15 ml solution Take 15 mLs by mouth every 4 (four) hours as needed for moderate pain. 03/21/14   Pedro Earls, MD  Insulin Glargine (LANTUS SOLOSTAR) 100 UNIT/ML Solostar Pen Inject 20 Units into the skin at bedtime. 01/06/14   Philemon Kingdom, MD  metFORMIN (GLUCOPHAGE) 500 MG tablet Take 2 tablets (1,000 mg total) by mouth 2 (two) times daily with a meal. 09/26/13   Philemon Kingdom, MD  ondansetron (ZOFRAN-ODT) 8 MG disintegrating tablet Take 1 tablet (8 mg total) by mouth every 8 (eight) hours as needed for nausea or vomiting. 03/01/14   Jasper Riling. Alvino Chapel, MD  silver sulfADIAZINE (SILVADENE) 1 % cream Apply to affected area daily 03/21/14 03/21/15  Pedro Earls, MD   BP 137/61  Pulse 74  Temp(Src) 98.2 F (36.8 C) (Oral)  Resp 17  Ht 5\' 1"  (1.549 m)  Wt 233 lb (105.688 kg)  BMI 44.05 kg/m2  SpO2 95%  LMP 11/16/2002 Physical Exam  Nursing note and vitals reviewed. Constitutional: She is oriented to person, place, and time. She appears well-developed and well-nourished. No distress.  HENT:  Head: Normocephalic and atraumatic.  Mouth/Throat: Oropharynx is clear and moist.  Eyes: Conjunctivae and EOM are normal. Pupils are equal, round, and reactive to light.  Cardiovascular: Normal rate, regular rhythm and intact distal pulses.   Pulmonary/Chest:  Effort normal and breath sounds normal. No stridor. No respiratory distress. She has no wheezes. She has no rales. She exhibits no tenderness.  Abdominal: Soft. Bowel sounds are normal. She exhibits no distension and no mass. There is no tenderness. There is no rebound and no guarding.  Musculoskeletal: Normal range of motion.  Neurological: She is alert and oriented to person, place, and time.  Skin:  3x open cauterized sinus tracts to right axilla with dressing in place and mild amount of serosanguinous discharge. No warmth, erythema, induration,  tenderness to palpation.  Psychiatric: She has a normal mood and affect.    Photo taken  1.5hours after dressing change with very scant serosanguinous discharge.   Pt gave verbal permission for secure photo to be taken for their chart.   ED Course  Procedures (including critical care time) Labs Review Labs Reviewed  CBC WITH DIFFERENTIAL - Abnormal; Notable for the following:    WBC 14.7 (*)    Neutro Abs 10.7 (*)    All other components within normal limits  BASIC METABOLIC PANEL - Abnormal; Notable for the following:    Glucose, Bld 158 (*)    GFR calc non Af Amer 62 (*)    GFR calc Af Amer 72 (*)    All other components within normal limits  APTT  PROTIME-INR    Imaging Review No results found.   EKG Interpretation None      MDM   Final diagnoses:  Wound check, abscess  Hydradenitis    Filed Vitals:   03/22/14 0543 03/22/14 0544 03/22/14 0811 03/22/14 0815  BP: 168/81  127/62 137/61  Pulse: 88  93 74  Temp: 97.7 F (36.5 C)  98.2 F (36.8 C)   TempSrc: Oral  Oral   Resp: 18  17   Height:  5\' 1"  (1.549 m)    Weight:  233 lb (105.688 kg)    SpO2: 99%  94% 95%    Medications  HYDROcodone-acetaminophen (NORCO/VICODIN) 5-325 MG per tablet 1 tablet (1 tablet Oral Given 03/22/14 8182)    [name] is a 58 y.o. female presenting with  is a bleeding from surgical site to right axillary hidradenitis super T.  the. Physical exam shows a scant amount of serosanguineous drainage from the wound site. There is no signs of overlying infection. Patient is afebrile and well-appearing. Dressing changed and rechecked with the only scant amount of discharge (see photograph) discussed case with surgeon Dr.Wakefeild who will inform Dr. Hassell Done of the visit. Patient has pain medication at home. We have discussed return precautions. Advised her to follow for postop check next week.  Evaluation does not show pathology that would require ongoing emergent intervention or inpatient treatment. Pt is hemodynamically stable and mentating appropriately. Discussed findings and plan  with patient/guardian, who agrees with care plan. All questions answered. Return precautions discussed and outpatient follow up given.   Note: Portions of this report may have been transcribed using voice recognition software. Every effort was made to ensure accuracy; however, inadvertent computerized transcription errors may be present     Monico Blitz, PA-C 03/22/14 0900

## 2014-03-25 ENCOUNTER — Encounter (HOSPITAL_BASED_OUTPATIENT_CLINIC_OR_DEPARTMENT_OTHER): Payer: Self-pay | Admitting: Surgery

## 2014-03-25 LAB — HM MAMMOGRAPHY

## 2014-03-26 ENCOUNTER — Encounter (HOSPITAL_COMMUNITY): Payer: Self-pay | Admitting: Emergency Medicine

## 2014-03-26 ENCOUNTER — Emergency Department (HOSPITAL_COMMUNITY)
Admission: EM | Admit: 2014-03-26 | Discharge: 2014-03-26 | Disposition: A | Discharge status: Home / Self Care | Payer: BC Managed Care – PPO | Attending: Emergency Medicine | Admitting: Emergency Medicine

## 2014-03-26 DIAGNOSIS — Z79899 Other long term (current) drug therapy: Secondary | ICD-10-CM | POA: Insufficient documentation

## 2014-03-26 DIAGNOSIS — Z87440 Personal history of urinary (tract) infections: Secondary | ICD-10-CM | POA: Insufficient documentation

## 2014-03-26 DIAGNOSIS — Z860100 Personal history of colon polyps, unspecified: Secondary | ICD-10-CM | POA: Insufficient documentation

## 2014-03-26 DIAGNOSIS — I10 Essential (primary) hypertension: Secondary | ICD-10-CM | POA: Insufficient documentation

## 2014-03-26 DIAGNOSIS — Z85528 Personal history of other malignant neoplasm of kidney: Secondary | ICD-10-CM | POA: Insufficient documentation

## 2014-03-26 DIAGNOSIS — IMO0002 Reserved for concepts with insufficient information to code with codable children: Secondary | ICD-10-CM | POA: Insufficient documentation

## 2014-03-26 DIAGNOSIS — T8189XA Other complications of procedures, not elsewhere classified, initial encounter: Secondary | ICD-10-CM | POA: Insufficient documentation

## 2014-03-26 DIAGNOSIS — Z794 Long term (current) use of insulin: Secondary | ICD-10-CM | POA: Insufficient documentation

## 2014-03-26 DIAGNOSIS — Y849 Medical procedure, unspecified as the cause of abnormal reaction of the patient, or of later complication, without mention of misadventure at the time of the procedure: Secondary | ICD-10-CM | POA: Insufficient documentation

## 2014-03-26 DIAGNOSIS — Z8719 Personal history of other diseases of the digestive system: Secondary | ICD-10-CM | POA: Insufficient documentation

## 2014-03-26 DIAGNOSIS — Z862 Personal history of diseases of the blood and blood-forming organs and certain disorders involving the immune mechanism: Secondary | ICD-10-CM | POA: Insufficient documentation

## 2014-03-26 DIAGNOSIS — T148XXA Other injury of unspecified body region, initial encounter: Secondary | ICD-10-CM

## 2014-03-26 DIAGNOSIS — Z8619 Personal history of other infectious and parasitic diseases: Secondary | ICD-10-CM | POA: Insufficient documentation

## 2014-03-26 DIAGNOSIS — E119 Type 2 diabetes mellitus without complications: Secondary | ICD-10-CM | POA: Insufficient documentation

## 2014-03-26 LAB — CBC WITH DIFFERENTIAL/PLATELET
BASOS ABS: 0 10*3/uL (ref 0.0–0.1)
Basophils Relative: 0 % (ref 0–1)
Eosinophils Absolute: 0.2 10*3/uL (ref 0.0–0.7)
Eosinophils Relative: 2 % (ref 0–5)
HCT: 37.1 % (ref 36.0–46.0)
Hemoglobin: 12.2 g/dL (ref 12.0–15.0)
LYMPHS PCT: 42 % (ref 12–46)
Lymphs Abs: 4.4 10*3/uL — ABNORMAL HIGH (ref 0.7–4.0)
MCH: 28.2 pg (ref 26.0–34.0)
MCHC: 32.9 g/dL (ref 30.0–36.0)
MCV: 85.7 fL (ref 78.0–100.0)
Monocytes Absolute: 0.6 10*3/uL (ref 0.1–1.0)
Monocytes Relative: 6 % (ref 3–12)
NEUTROS PCT: 50 % (ref 43–77)
Neutro Abs: 5.2 10*3/uL (ref 1.7–7.7)
PLATELETS: 280 10*3/uL (ref 150–400)
RBC: 4.33 MIL/uL (ref 3.87–5.11)
RDW: 14.8 % (ref 11.5–15.5)
WBC: 10.4 10*3/uL (ref 4.0–10.5)

## 2014-03-26 LAB — COMPREHENSIVE METABOLIC PANEL
ALT: 15 U/L (ref 0–35)
AST: 15 U/L (ref 0–37)
Albumin: 3.1 g/dL — ABNORMAL LOW (ref 3.5–5.2)
Alkaline Phosphatase: 104 U/L (ref 39–117)
BUN: 26 mg/dL — ABNORMAL HIGH (ref 6–23)
CHLORIDE: 100 meq/L (ref 96–112)
CO2: 23 meq/L (ref 19–32)
Calcium: 9.6 mg/dL (ref 8.4–10.5)
Creatinine, Ser: 0.97 mg/dL (ref 0.50–1.10)
GFR calc Af Amer: 74 mL/min — ABNORMAL LOW (ref >90)
GFR calc non Af Amer: 64 mL/min — ABNORMAL LOW (ref >90)
GLUCOSE: 161 mg/dL — AB (ref 70–99)
Potassium: 4.3 mEq/L (ref 3.7–5.3)
SODIUM: 138 meq/L (ref 137–147)
Total Bilirubin: <0.2 mg/dL — ABNORMAL LOW (ref 0.3–1.2)
Total Protein: 7.9 g/dL (ref 6.0–8.3)

## 2014-03-26 NOTE — ED Notes (Signed)
The pt is c/o increased bleeding from  A wound under her rt arm that was excised may 8th.  The drainage has been bloody today

## 2014-03-26 NOTE — Discharge Instructions (Signed)
Dressing Change °A dressing is a material placed over wounds. It keeps the wound clean, dry, and protected from further injury. This provides an environment that favors wound healing.  °BEFORE YOU BEGIN °· Get your supplies together. Things you may need include: °· Saline solution. °· Flexible gauze dressing. °· Medicated cream. °· Tape. °· Gloves. °· Abdominal dressing pads. °· Gauze squares. °· Plastic bags. °· Take pain medicine 30 minutes before the dressing change if you need it. °· Take a shower before you do the first dressing change of the day. Use plastic wrap or a plastic bag to prevent the dressing from getting wet. °REMOVING YOUR OLD DRESSING  °· Wash your hands with soap and water. Dry your hands with a clean towel. °· Put on your gloves. °· Remove any tape. °· Carefully remove the old dressing. If the dressing sticks, you may dampen it with warm water to loosen it, or follow your caregiver's specific directions. °· Remove any gauze or packing tape that is in your wound. °· Take off your gloves. °· Put the gloves, tape, gauze, or any packing tape into a plastic bag. °CHANGING YOUR DRESSING °· Open the supplies. °· Take the cap off the saline solution. °· Open the gauze package so that the gauze remains on the inside of the package. °· Put on your gloves. °· Clean your wound as told by your caregiver. °· If you have been told to keep your wound dry, follow those instructions. °· Your caregiver may tell you to do one or more of the following: °· Pick up the gauze. Pour the saline solution over the gauze. Squeeze out the extra saline solution. °· Put medicated cream or other medicine on your wound if you have been told to do so. °· Put the solution soaked gauze only in your wound, not on the skin around it. °· Pack your wound loosely or as told by your caregiver. °· Put dry gauze on your wound. °· Put abdominal dressing pads over the dry gauze if your wet gauze soaks through. °· Tape the abdominal dressing  pads in place so they will not fall off. Do not wrap the tape completely around the affected part (arm, leg, abdomen). °· Wrap the dressing pads with a flexible gauze dressing to secure it in place. °· Take off your gloves. Put them in the plastic bag with the old dressing. Tie the bag shut and throw it away. °· Keep the dressing clean and dry until your next dressing change. °· Wash your hands. °SEEK MEDICAL CARE IF: °· Your skin around the wound looks red. °· Your wound feels more tender or sore. °· You see pus in the wound. °· Your wound smells bad. °· You have a fever. °· Your skin around the wound has a rash that itches and burns. °· You see black or yellow skin in your wound that was not there before. °· You feel nauseous, throw up, and feel very tired. °Document Released: 12/08/2004 Document Revised: 01/23/2012 Document Reviewed: 09/12/2011 °ExitCare® Patient Information ©2014 [employer]. ° °

## 2014-03-26 NOTE — ED Provider Notes (Signed)
CSN: [id number]     Arrival date & time 03/26/14  1857 History   First MD Initiated Contact with Patient 03/26/14 2059     Chief Complaint  Patient presents with  . Post-op Problem     (Consider location/radiation/quality/duration/timing/severity/associated sxs/prior Treatment) Patient is a 58 y.o. female presenting with wound check. The history is provided by the patient.  Wound Check This is a new problem. The current episode started today. The problem occurs constantly. Progression since onset: Having an episode of bleeding today. Pertinent negatives include no abdominal pain, anorexia, coughing, fever, numbness, rash, sore throat, swollen glands, vomiting or weakness. Nothing aggravates the symptoms. She has tried nothing for the symptoms. The treatment provided no relief.    Past Medical History  Diagnosis Date  . Diverticulosis of colon with hemorrhage 2010, 09/2011  . Hypertension   . Personal history of colonic polyps     adenomas since 2010  . Diabetes mellitus   . Renal cell carcinoma 2004  . Blood transfusion   . IBS (irritable bowel syndrome)   . Hx: UTI (urinary tract infection)   . Anemia   . Helicobacter pylori gastritis 09/13/2013  . Blood transfusion without reported diagnosis   . H/O hiatal hernia    Past Surgical History  Procedure Laterality Date  . Nephrectomy      left  . Tubal ligation    . Knee arthroscopy      x2 left  . Hand surgery      x5 right  . Abdominal hysterectomy    . Cholecystectomy    . Upper gastrointestinal endoscopy  12/12/2008    gastroporesis  . Flexible sigmoidoscopy  12/12/2008    diverticulosis  . Colonoscopy  12/19/2008    diverticulosis  . Colonoscopy  11/17/2011    Procedure: COLONOSCOPY;  Surgeon: Zenovia Jarred, MD;  Location: [facility];  Service: Gastroenterology;  Laterality: N/A;  . Hydradenitis excision Right 03/21/2014    Procedure: EXCISION HIDRADENITIS AXILLA;  Surgeon: Pedro Earls, MD;  Location: [place];  Service: General;  Laterality: Right;   Family History  Problem Relation Age of Onset  . Colon polyps Mother   . Diabetes Maternal Grandmother   . Cancer Maternal Grandmother     breast  . Diabetes Father   . Colon cancer Neg Hx   . Cancer Daughter     breast   History  Substance Use Topics  . Smoking status: Never Smoker   . Smokeless tobacco: Never Used  . Alcohol Use: No   OB History   Grav Para Term Preterm Abortions TAB SAB Ect Mult Living                 Review of Systems  Constitutional: Negative for fever.  HENT: Negative for sore throat.   Respiratory: Negative for cough.   Gastrointestinal: Negative for vomiting, abdominal pain and anorexia.  Skin: Positive for wound (From prior resection). Negative for rash.  Neurological: Negative for weakness and numbness.  All other systems reviewed and are negative.     Allergies  Review of patient's allergies indicates no known allergies.  Home Medications   Prior to Admission medications   Medication Sig Start Date End Date Taking? Authorizing Provider  hydrochlorothiazide (MICROZIDE) 12.5 MG capsule Take 12.5 mg by mouth daily.   Yes Historical Provider, MD  HYDROcodone-acetaminophen (NORCO/VICODIN) 5-325 MG per tablet Take 1 tablet by mouth every 6 (six) hours as needed for moderate pain.   Yes  Historical Provider, MD  Insulin Glargine (LANTUS SOLOSTAR) 100 UNIT/ML Solostar Pen Inject 20 Units into the skin at bedtime. 01/06/14  Yes Philemon Kingdom, MD  metFORMIN (GLUCOPHAGE) 500 MG tablet Take 2 tablets (1,000 mg total) by mouth 2 (two) times daily with a meal. 09/26/13  Yes Philemon Kingdom, MD  albuterol (PROVENTIL) (2.5 MG/3ML) 0.083% nebulizer solution Take 2.5 mg by nebulization every 6 (six) hours as needed for wheezing or shortness of breath.    Historical Provider, MD   BP 171/103  Pulse 95  Temp(Src) 98.5 F (36.9 C) (Oral)  Resp 15  SpO2 98%  LMP 11/16/2002 Physical Exam    Constitutional: She is oriented to person, place, and time. She appears well-developed and well-nourished. No distress.  HENT:  Head: Normocephalic.  Eyes: Conjunctivae are normal.  Neck: Neck supple. No tracheal deviation present.  Cardiovascular: Normal rate and regular rhythm.   Pulmonary/Chest: Effort normal. No respiratory distress.  Abdominal: Soft. She exhibits no distension.  Neurological: She is alert and oriented to person, place, and time.  Skin: Skin is warm and dry. Lesion (3 areas where resection occurred with good granulation tissue, coagulating blood, well-defined margins and no surrounding erythema, induration, or fluctuance) noted.     Psychiatric: She has a normal mood and affect.    ED Course  Procedures (including critical care time) Labs Review Labs Reviewed  CBC WITH DIFFERENTIAL - Abnormal; Notable for the following:    Lymphs Abs 4.4 (*)    All other components within normal limits  COMPREHENSIVE METABOLIC PANEL - Abnormal; Notable for the following:    Glucose, Bld 161 (*)    BUN 26 (*)    Albumin 3.1 (*)    Total Bilirubin <0.2 (*)    GFR calc non Af Amer 64 (*)    GFR calc Af Amer 74 (*)    All other components within normal limits    Imaging Review No results found.   EKG Interpretation None      MDM   Final diagnoses:  Delayed surgical wound healing  Bleeding from wound   58 y.o. female presents with surgical wound in her right axilla where she recently had an area of infection removed. There are 3 areas there that all appear to be granulating appropriately. Her concern is that they were bleeding earlier today and she filled up a towel. Screening labs were performed to look for anemia or other metabolic disturbance. Patient has dressing changes with Silvadene for home as instructed by her surgeon.  She's not anemic today, she is not symptomatic otherwise in the area surrounding her wounds it is not erythematous or warm to suggest that is  infected at this time. We'll recommend close followup with her surgeon and routine wound care as instructed prior.  Leo Grosser, MD 03/27/14 [phone]

## 2014-03-27 NOTE — ED Provider Notes (Signed)
I saw and evaluated the patient, reviewed the resident's note and I agree with the findings and plan.   .Face to face Exam:  General:  Awake HEENT:  Atraumatic Resp:  Normal effort Abd:  Nondistended Neuro:No focal weakness  Dot Lanes, MD 03/27/14 [phone]

## 2014-03-28 ENCOUNTER — Ambulatory Visit (INDEPENDENT_AMBULATORY_CARE_PROVIDER_SITE_OTHER): Payer: BC Managed Care – PPO | Admitting: Surgery

## 2014-03-28 ENCOUNTER — Encounter (INDEPENDENT_AMBULATORY_CARE_PROVIDER_SITE_OTHER): Payer: Self-pay | Admitting: Surgery

## 2014-03-28 ENCOUNTER — Telehealth (INDEPENDENT_AMBULATORY_CARE_PROVIDER_SITE_OTHER): Payer: Self-pay

## 2014-03-28 VITALS — BP 120/75 | HR 86 | Temp 98.3°F | Resp 16 | Ht 61.0 in | Wt 236.4 lb

## 2014-03-28 DIAGNOSIS — L732 Hidradenitis suppurativa: Secondary | ICD-10-CM

## 2014-03-28 NOTE — Telephone Encounter (Signed)
Pt called stating she is having what appears to be pus coming from axillary wd. Pt states she is diabetic and concerned. Pt request appt today. Pt given appt with urg office today.

## 2014-03-28 NOTE — Progress Notes (Signed)
[place], MD,  [address]  [place], [place]    [place] Phone:  [phone] FAX:  [phone]   Re:   [name] DOB:   [birth year] MRN:   [id number]  Urgent Office  ASSESSMENT AND PLAN: 1.  Hidradenitis of right axilla  Excised (wounds not closed) by Dr. Hassell Done - 03/21/2014  [photo at end of the note]  There are two wounds - 5 x 3 cm and 2 x 1 cm  Both are open and clean.  She will continue her same TID washing wound and painting it with silvadene.  She has an appt to see Dr. Hassell Done on 04/11/2014  2.  Diabetes mellitus 3.  HTN 4.  Diverticulosis of colon with hemorrhage - recurrent. 5.  IBS (irritable bowel syndrome)  HISTORY OF PRESENT ILLNESS: Chief Complaint  Patient presents with  . Wound Check   [name] is a 58 y.o. (DOB: [birth year])  AA  female who is a patient of Annye Asa, MD and comes to Urgent Office today for "pus from axillary wound". She comes by herself. She has been to [facility] ER twice since surgery, each for bleeding.  She went 03/22/2014 and 03/26/2014.  But it does not look like she contacted our office until today when she called worried about pus from the wound. But when she is here, she talked more about bleeding from the wounds which sent her to [facility] ER and nothing about pus.  Past Medical History  Diagnosis Date  . Diverticulosis of colon with hemorrhage 2010, 09/2011  . Hypertension   . Personal history of colonic polyps     adenomas since 2010  . Diabetes mellitus   . Renal cell carcinoma 2004  . Blood transfusion   . IBS (irritable bowel syndrome)   . Hx: UTI (urinary tract infection)   . Anemia   . Helicobacter pylori gastritis 09/13/2013  . Blood transfusion without reported diagnosis   . H/O hiatal hernia    SOCIAL HISTORY:   PHYSICAL EXAM: BP 120/75  Pulse 86  Temp(Src) 98.3 F (36.8 C)  Resp 16  Ht 5\' 1"  (1.549 m)  Wt 236 lb 6.4 oz (107.23 kg)   BMI 44.69 kg/m2  LMP 11/16/2002  Right axilla - 5 x 3 cm and 1 x 2 cm wounds that are clean.  They are not bleeding at this time and there is "visible" vessel.   Right axillary wounds  DATA REVIEWED: Epic notes  Alphonsa Overall, MD,  [facility], [address]  [place], [place]    [place] Phone:  [phone] FAX:  [phone]

## 2014-04-04 ENCOUNTER — Telehealth: Payer: Self-pay | Admitting: Internal Medicine

## 2014-04-04 ENCOUNTER — Other Ambulatory Visit: Payer: Self-pay | Admitting: *Deleted

## 2014-04-04 MED ORDER — "INSULIN SYRINGE-NEEDLE U-100 31G X 5/16"" 0.5 ML MISC": Status: DC

## 2014-04-04 MED ORDER — INSULIN NPH (HUMAN) (ISOPHANE) 100 UNIT/ML ~~LOC~~ SUSP: SUBCUTANEOUS | Status: DC

## 2014-04-04 NOTE — Telephone Encounter (Signed)
She is having trouble with her insurance for the Lantus please assist she has been out for a week. Is there a cheaper alternative? Lantus is over $300.

## 2014-04-04 NOTE — Telephone Encounter (Signed)
We can send NPH 10 units in am and 10 units at bedtime >> [place], please call in ReliOn NPH 1 vial with #3 refills at the [facility] of her choice (this is the cheapest). Does she need syringes? If so, let's send 0.5 mL ones with 88 mm/31G needles.

## 2014-04-04 NOTE — Telephone Encounter (Signed)
Please read note below and advise.  

## 2014-04-04 NOTE — Telephone Encounter (Signed)
Done

## 2014-04-10 ENCOUNTER — Encounter (INDEPENDENT_AMBULATORY_CARE_PROVIDER_SITE_OTHER): Payer: BC Managed Care – PPO | Admitting: General Surgery

## 2014-04-11 ENCOUNTER — Ambulatory Visit (INDEPENDENT_AMBULATORY_CARE_PROVIDER_SITE_OTHER): Payer: BC Managed Care – PPO | Admitting: Surgery

## 2014-04-11 ENCOUNTER — Encounter (INDEPENDENT_AMBULATORY_CARE_PROVIDER_SITE_OTHER): Payer: Self-pay | Admitting: Surgery

## 2014-04-11 VITALS — BP 162/102 | HR 80 | Temp 98.6°F | Resp 16 | Ht 61.0 in | Wt 237.0 lb

## 2014-04-11 DIAGNOSIS — L732 Hidradenitis suppurativa: Secondary | ICD-10-CM

## 2014-04-11 NOTE — Progress Notes (Signed)
[facility] 58 y.o.  Body mass index is 44.8 kg/(m^2).  Patient Active Problem List   Diagnosis Date Noted  . Benign paroxysmal positional vertigo 10/03/2013  . Nausea with vomiting 10/03/2013  . Bleeding nose 10/03/2013  . Helicobacter pylori gastritis 09/13/2013  . Pyuria 08/20/2013  . Abnormal abdominal CT scan 08/20/2013  . Abdominal pain 08/16/2013  . Back pain 07/31/2013  . LUQ pain 07/31/2013  . Laceration of hand 07/10/2013  . Hidradenitis suppurativa of right axilla 05/31/2013  . Renal cell carcinoma   . IBS (irritable bowel syndrome)   . S/p nephrectomy   . 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 OBESITY, MORBID 09/16/2009  . DM (diabetes mellitus), type 2 with complications 65/99/3570  . HYPERTENSION 06/23/2009  . Personal history of colonoc adenomas 12/12/2008    No Known Allergies  Past Surgical History  Procedure Laterality Date  . Nephrectomy      left  . Tubal ligation    . Knee arthroscopy      x2 left  . Hand surgery      x5 right  . Abdominal hysterectomy    . Cholecystectomy    . Upper gastrointestinal endoscopy  12/12/2008    gastroporesis  . Flexible sigmoidoscopy  12/12/2008    diverticulosis  . Colonoscopy  12/19/2008    diverticulosis  . Colonoscopy  11/17/2011    Procedure: COLONOSCOPY;  Surgeon: Zenovia Jarred, MD;  Location: [facility];  Service: Gastroenterology;  Laterality: N/A;  . Hydradenitis excision Right 03/21/2014    Procedure: EXCISION HIDRADENITIS AXILLA;  Surgeon: Pedro Earls, MD;  Location: [place];  Service: General;  Laterality: Right;   Annye Asa, MD No diagnosis found.  Right axillary hidradenitis excision site is looking better.  The beds of the open areas are pink and healing.  Continue silvadene topically and dress daily. Will recheck in 6 weeks.  Matt B. Hassell Done, MD, [facility] [phone]  beeper [phone]  04/11/2014 3:56 PM

## 2014-05-23 ENCOUNTER — Encounter (INDEPENDENT_AMBULATORY_CARE_PROVIDER_SITE_OTHER): Payer: Self-pay | Admitting: Surgery

## 2014-05-26 ENCOUNTER — Ambulatory Visit: Payer: BC Managed Care – PPO | Admitting: Internal Medicine

## 2014-06-09 ENCOUNTER — Telehealth: Payer: Self-pay

## 2014-06-09 NOTE — Telephone Encounter (Signed)
Pt scheduled for 07/28/2014 with Dr. Cruzita Lederer. Diabetic Bundle put into appointment notes.

## 2014-06-25 ENCOUNTER — Other Ambulatory Visit: Payer: Self-pay | Admitting: Family Medicine

## 2014-06-26 NOTE — Telephone Encounter (Signed)
Med filled.  

## 2014-07-11 ENCOUNTER — Encounter (HOSPITAL_COMMUNITY): Payer: Self-pay | Admitting: Emergency Medicine

## 2014-07-11 ENCOUNTER — Telehealth: Payer: Self-pay

## 2014-07-11 ENCOUNTER — Encounter (HOSPITAL_COMMUNITY): Payer: Self-pay | Admitting: Anesthesiology

## 2014-07-11 ENCOUNTER — Encounter (HOSPITAL_COMMUNITY)
Admission: EM | Disposition: A | Discharge status: Home / Self Care | Payer: Self-pay | Source: Home / Self Care | Attending: Emergency Medicine

## 2014-07-11 ENCOUNTER — Inpatient Hospital Stay (HOSPITAL_COMMUNITY): Payer: BC Managed Care – PPO | Admitting: Anesthesiology

## 2014-07-11 ENCOUNTER — Ambulatory Visit (HOSPITAL_COMMUNITY)
Admission: EM | Admit: 2014-07-11 | Discharge: 2014-07-11 | Disposition: A | Discharge status: Home / Self Care | Payer: Self-pay | Attending: Obstetrics and Gynecology | Admitting: Obstetrics and Gynecology

## 2014-07-11 DIAGNOSIS — I10 Essential (primary) hypertension: Secondary | ICD-10-CM | POA: Insufficient documentation

## 2014-07-11 DIAGNOSIS — X58XXXA Exposure to other specified factors, initial encounter: Secondary | ICD-10-CM | POA: Insufficient documentation

## 2014-07-11 DIAGNOSIS — S3140XA Unspecified open wound of vagina and vulva, initial encounter: Secondary | ICD-10-CM | POA: Insufficient documentation

## 2014-07-11 DIAGNOSIS — S3141XA Laceration without foreign body of vagina and vulva, initial encounter: Secondary | ICD-10-CM

## 2014-07-11 DIAGNOSIS — E119 Type 2 diabetes mellitus without complications: Secondary | ICD-10-CM | POA: Insufficient documentation

## 2014-07-11 HISTORY — PX: DILATION AND CURETTAGE OF UTERUS: SHX78

## 2014-07-11 LAB — COMPREHENSIVE METABOLIC PANEL
ALT: 15 U/L (ref 0–35)
ANION GAP: 11 (ref 5–15)
AST: 15 U/L (ref 0–37)
Albumin: 3 g/dL — ABNORMAL LOW (ref 3.5–5.2)
Alkaline Phosphatase: 96 U/L (ref 39–117)
BUN: 27 mg/dL — AB (ref 6–23)
CALCIUM: 8.9 mg/dL (ref 8.4–10.5)
CO2: 25 mEq/L (ref 19–32)
Chloride: 102 mEq/L (ref 96–112)
Creatinine, Ser: 1.11 mg/dL — ABNORMAL HIGH (ref 0.50–1.10)
GFR calc Af Amer: 63 mL/min — ABNORMAL LOW (ref >90)
GFR calc non Af Amer: 54 mL/min — ABNORMAL LOW (ref >90)
Glucose, Bld: 188 mg/dL — ABNORMAL HIGH (ref 70–99)
Potassium: 4.2 mEq/L (ref 3.7–5.3)
SODIUM: 138 meq/L (ref 137–147)
TOTAL PROTEIN: 6.8 g/dL (ref 6.0–8.3)
Total Bilirubin: <0.2 mg/dL — ABNORMAL LOW (ref 0.3–1.2)

## 2014-07-11 LAB — CBC
HEMATOCRIT: 36 % (ref 36.0–46.0)
Hemoglobin: 11.8 g/dL — ABNORMAL LOW (ref 12.0–15.0)
MCH: 28.2 pg (ref 26.0–34.0)
MCHC: 32.8 g/dL (ref 30.0–36.0)
MCV: 86.1 fL (ref 78.0–100.0)
Platelets: 228 10*3/uL (ref 150–400)
RBC: 4.18 MIL/uL (ref 3.87–5.11)
RDW: 14.3 % (ref 11.5–15.5)
WBC: 12.7 10*3/uL — ABNORMAL HIGH (ref 4.0–10.5)

## 2014-07-11 LAB — TYPE AND SCREEN
ABO/RH(D): O POS
Antibody Screen: NEGATIVE

## 2014-07-11 LAB — GLUCOSE, CAPILLARY
Glucose-Capillary: 186 mg/dL — ABNORMAL HIGH (ref 70–99)
Glucose-Capillary: 184 mg/dL — ABNORMAL HIGH (ref 70–99)

## 2014-07-11 LAB — ABO/RH: ABO/RH(D): O POS

## 2014-07-11 SURGERY — DILATION AND CURETTAGE
Anesthesia: General | Site: Vagina

## 2014-07-11 MED ORDER — ONDANSETRON HCL 4 MG/2ML IJ SOLN
INTRAMUSCULAR | Status: DC | PRN
  Administered 2014-07-11: 4 mg via INTRAVENOUS

## 2014-07-11 MED ORDER — MEPERIDINE HCL 25 MG/ML IJ SOLN: 6.2500 mg | INTRAMUSCULAR | Status: DC | PRN

## 2014-07-11 MED ORDER — FENTANYL CITRATE 0.05 MG/ML IJ SOLN
INTRAMUSCULAR | Status: AC
  Filled 2014-07-11: qty 2

## 2014-07-11 MED ORDER — LIDOCAINE HCL (CARDIAC) 20 MG/ML IV SOLN
INTRAVENOUS | Status: AC
  Filled 2014-07-11: qty 5

## 2014-07-11 MED ORDER — DEXAMETHASONE SODIUM PHOSPHATE 10 MG/ML IJ SOLN
INTRAMUSCULAR | Status: AC
  Filled 2014-07-11: qty 1

## 2014-07-11 MED ORDER — ACETAMINOPHEN 160 MG/5ML PO SOLN
ORAL | Status: AC
  Filled 2014-07-11: qty 20.3

## 2014-07-11 MED ORDER — MIDAZOLAM HCL 5 MG/5ML IJ SOLN
INTRAMUSCULAR | Status: DC | PRN
  Administered 2014-07-11: 2 mg via INTRAVENOUS

## 2014-07-11 MED ORDER — ACETAMINOPHEN 160 MG/5ML PO SOLN: 975.0000 mg | Freq: Once | ORAL | Status: DC

## 2014-07-11 MED ORDER — ONDANSETRON HCL 4 MG/2ML IJ SOLN: 4.0000 mg | Freq: Once | INTRAMUSCULAR | Status: DC | PRN

## 2014-07-11 MED ORDER — PHENYLEPHRINE HCL 10 MG/ML IJ SOLN
INTRAMUSCULAR | Status: DC | PRN
  Administered 2014-07-11 (×8): 80 ug via INTRAVENOUS

## 2014-07-11 MED ORDER — ONDANSETRON HCL 4 MG/2ML IJ SOLN
INTRAMUSCULAR | Status: AC
  Filled 2014-07-11: qty 2

## 2014-07-11 MED ORDER — PHENYLEPHRINE 40 MCG/ML (10ML) SYRINGE FOR IV PUSH (FOR BLOOD PRESSURE SUPPORT)
PREFILLED_SYRINGE | INTRAVENOUS | Status: AC
  Filled 2014-07-11: qty 10

## 2014-07-11 MED ORDER — PROPOFOL 10 MG/ML IV BOLUS
INTRAVENOUS | Status: DC | PRN
  Administered 2014-07-11: 200 mg via INTRAVENOUS

## 2014-07-11 MED ORDER — LIDOCAINE HCL (CARDIAC) 20 MG/ML IV SOLN
INTRAVENOUS | Status: DC | PRN
  Administered 2014-07-11: 50 mg via INTRAVENOUS

## 2014-07-11 MED ORDER — PROPOFOL 10 MG/ML IV EMUL
INTRAVENOUS | Status: AC
  Filled 2014-07-11: qty 20

## 2014-07-11 MED ORDER — HYDROMORPHONE HCL PF 1 MG/ML IJ SOLN
1.0000 mg | Freq: Once | INTRAMUSCULAR | Status: AC
Start: 2014-07-11 — End: 2014-07-11
  Administered 2014-07-11: 1 mg via INTRAVENOUS
  Filled 2014-07-11: qty 1

## 2014-07-11 MED ORDER — MIDAZOLAM HCL 2 MG/2ML IJ SOLN
INTRAMUSCULAR | Status: AC
  Filled 2014-07-11: qty 2

## 2014-07-11 MED ORDER — IBUPROFEN 600 MG PO TABS: 600.0000 mg | ORAL_TABLET | Freq: Four times a day (QID) | ORAL | Status: DC | PRN

## 2014-07-11 MED ORDER — FENTANYL CITRATE 0.05 MG/ML IJ SOLN
INTRAMUSCULAR | Status: DC | PRN
Start: 2014-07-11 — End: 2014-07-11
  Administered 2014-07-11: 100 ug via INTRAVENOUS

## 2014-07-11 MED ORDER — FENTANYL CITRATE 0.05 MG/ML IJ SOLN
25.0000 ug | INTRAMUSCULAR | Status: DC | PRN
  Administered 2014-07-11 (×2): 25 ug via INTRAVENOUS

## 2014-07-11 MED ORDER — LACTATED RINGERS IV SOLN
INTRAVENOUS | Status: DC | PRN
  Administered 2014-07-11: 04:00:00 via INTRAVENOUS

## 2014-07-11 SURGICAL SUPPLY — 12 items
CATH ROBINSON RED A/P 16FR (CATHETERS) ×2 IMPLANT
CLOTH BEACON ORANGE TIMEOUT ST (SAFETY) ×2 IMPLANT
CONTAINER PREFILL 10% NBF 60ML (FORM) ×4 IMPLANT
GLOVE BIO SURGEON STRL SZ7.5 (GLOVE) ×6 IMPLANT
GOWN STRL REUS W/TWL LRG LVL3 (GOWN DISPOSABLE) ×4 IMPLANT
PACK VAGINAL MINOR WOMEN LF (CUSTOM PROCEDURE TRAY) ×2 IMPLANT
PAD OB MATERNITY 4.3X12.25 (PERSONAL CARE ITEMS) ×2 IMPLANT
PAD PREP 24X48 CUFFED NSTRL (MISCELLANEOUS) ×2 IMPLANT
SUT VIC AB 2-0 CT1 27 (SUTURE) ×2
SUT VIC AB 2-0 CT1 TAPERPNT 27 (SUTURE) ×1 IMPLANT
TOWEL OR 17X24 6PK STRL BLUE (TOWEL DISPOSABLE) ×4 IMPLANT
WATER STERILE IRR 1000ML POUR (IV SOLUTION) ×2 IMPLANT

## 2014-07-11 NOTE — MAU Note (Signed)
Arrival via [facility] from [place] , vaginal packing intact , Dr Ulanda Edison in MAU and at Bedside upon arrival

## 2014-07-11 NOTE — ED Provider Notes (Signed)
CSN: [id number]     Arrival date & time 07/11/14  0117 History   First MD Initiated Contact with Patient 07/11/14 0215     Chief Complaint  Patient presents with  . Vaginal Bleeding    LLQ  . Abdominal Pain     (Consider location/radiation/quality/duration/timing/severity/associated sxs/prior Treatment) HPI Comments: 58 year old female with a history of diverticulosis of the colon with extensive hemorrhage, requiring blood transfusion and a procedure to stop the bleeding. She presents because she has been having 2 weeks of left lower quadrant abdominal pain which has been intermittent but gradually worsening and now presenting with what she describes as heavy vaginal bleeding that started this evening when she was at a restaurant eating dinner. This is severe, persistent, nothing makes this better or worse. She reports having a prior hysterectomy and oophorectomy in the past and is not sexually active.  Patient is a 58 y.o. female presenting with vaginal bleeding and abdominal pain. The history is provided by the patient.  Vaginal Bleeding Associated symptoms: abdominal pain   Abdominal Pain Associated symptoms: vaginal bleeding     Past Medical History  Diagnosis Date  . Diverticulosis of colon with hemorrhage 2010, 09/2011  . Hypertension   . Personal history of colonic polyps     adenomas since 2010  . Diabetes mellitus   . Renal cell carcinoma 2004  . Blood transfusion   . IBS (irritable bowel syndrome)   . Hx: UTI (urinary tract infection)   . Anemia   . Helicobacter pylori gastritis 09/13/2013  . Blood transfusion without reported diagnosis   . H/O hiatal hernia    Past Surgical History  Procedure Laterality Date  . Nephrectomy      left  . Tubal ligation    . Knee arthroscopy      x2 left  . Hand surgery      x5 right  . Abdominal hysterectomy    . Cholecystectomy    . Upper gastrointestinal endoscopy  12/12/2008    gastroporesis  . Flexible sigmoidoscopy   12/12/2008    diverticulosis  . Colonoscopy  12/19/2008    diverticulosis  . Colonoscopy  11/17/2011    Procedure: COLONOSCOPY;  Surgeon: Zenovia Jarred, MD;  Location: [facility];  Service: Gastroenterology;  Laterality: N/A;  . Hydradenitis excision Right 03/21/2014    Procedure: EXCISION HIDRADENITIS AXILLA;  Surgeon: Pedro Earls, MD;  Location: [facility];  Service: General;  Laterality: Right;   Family History  Problem Relation Age of Onset  . Colon polyps Mother   . Diabetes Maternal Grandmother   . Cancer Maternal Grandmother     breast  . Diabetes Father   . Colon cancer Neg Hx   . Cancer Daughter     breast   History  Substance Use Topics  . Smoking status: Never Smoker   . Smokeless tobacco: Never Used  . Alcohol Use: No   OB History   Grav Para Term Preterm Abortions TAB SAB Ect Mult Living                 Review of Systems  Gastrointestinal: Positive for abdominal pain.  Genitourinary: Positive for vaginal bleeding.  All other systems reviewed and are negative.     Allergies  Review of patient's allergies indicates no known allergies.  Home Medications   Prior to Admission medications   Medication Sig Start Date End Date Taking? Authorizing Provider  albuterol (PROVENTIL) (2.5 MG/3ML) 0.083% nebulizer solution Take 2.5 mg  by nebulization every 6 (six) hours as needed for wheezing or shortness of breath.   Yes Historical Provider, MD  hydrochlorothiazide (MICROZIDE) 12.5 MG capsule Take 12.5 mg by mouth daily.   Yes Historical Provider, MD  insulin NPH Human (HUMULIN N) 100 UNIT/ML injection Inject into the skin 10 units in the morning and 10 units at bedtime. 04/04/14  Yes Philemon Kingdom, MD  Insulin Syringe-Needle U-100 (RELION INSULIN SYR 0.5ML/31G) 31G X 5/16" 0.5 ML MISC Use to inject insulin 2 times daily as instructed. 04/04/14   Philemon Kingdom, MD  metFORMIN (GLUCOPHAGE) 500 MG tablet Take 2 tablets (1,000 mg total) by mouth 2 (two)  times daily with a meal. 09/26/13   Philemon Kingdom, MD   BP 154/94  Pulse 96  Temp(Src) 98.3 F (36.8 C) (Oral)  Resp 20  SpO2 99%  LMP 11/16/2002 Physical Exam  Nursing note and vitals reviewed. Constitutional: She appears well-developed and well-nourished. No distress.  HENT:  Head: Normocephalic and atraumatic.  Mouth/Throat: Oropharynx is clear and moist. No oropharyngeal exudate.  Eyes: Conjunctivae and EOM are normal. Pupils are equal, round, and reactive to light. Right eye exhibits no discharge. Left eye exhibits no discharge. No scleral icterus.  Neck: Normal range of motion. Neck supple. No JVD present. No thyromegaly present.  Cardiovascular: Normal rate, regular rhythm, normal heart sounds and intact distal pulses.  Exam reveals no gallop and no friction rub.   No murmur heard. Pulmonary/Chest: Effort normal and breath sounds normal. No respiratory distress. She has no wheezes. She has no rales.  Abdominal: Soft. Bowel sounds are normal. She exhibits no distension and no mass. There is tenderness (tender to palpation the left lower quadrant with guarding, no tenderness on the right, no suprapubic tenderness).  Genitourinary:  Chaperone present for exam, the patient has heavy vaginal bleeding obscuring visualization of the vaginal cuff, there does not appear to be any lacerations on the vaginal walls but with clearing of the blood it does appear that there is an injury at the vaginal cuff site.  Musculoskeletal: Normal range of motion. She exhibits no edema and no tenderness.  Lymphadenopathy:    She has no cervical adenopathy.  Neurological: She is alert. Coordination normal.  Skin: Skin is warm and dry. No rash noted. No erythema.  Psychiatric: She has a normal mood and affect. Her behavior is normal.    ED Course  Procedures (including critical care time) Labs Review Labs Reviewed  CBC WITH DIFFERENTIAL  BASIC METABOLIC PANEL  TYPE AND SCREEN    Imaging  Review No results found.    MDM   Final diagnoses:  Vaginal laceration, initial encounter    The patient now endorses that she had sexual intercourse this evening, after initial penetration she felt acute onset of bleeding which has been persistent and have since that time. That was 3 hours prior to evaluation. Because of the patient's heavy bleeding gynecology has been consulted, awaiting returned page at this time. Check CBC, basic metabolic panel, type and screen, pain medication. The patient is hemodynamically stable at this time with a blood pressure of 150/90 and a pulse of 96.  Discussed with obstetrician Dr. Ulanda Edison - will come to evaluate pt - packing placed in Vagina to tamponade bleeding at his request.  Dr. Jasmine Pang has seen the patient, he recommends transporting to [facility], called for stat transport.  Johnna Acosta, MD 07/11/14 [phone]

## 2014-07-11 NOTE — Transfer of Care (Signed)
Immediate Anesthesia Transfer of Care Note  Patient: [name]  Procedure(s) Performed: Procedure(s): DILATATION AND CURETTAGE (N/A)  Patient Location: PACU  Anesthesia Type:General  Level of Consciousness: awake, alert , oriented and patient cooperative  Airway & Oxygen Therapy: Patient Spontanous Breathing and Patient connected to nasal cannula oxygen  Post-op Assessment: Report given to PACU RN, Post -op Vital signs reviewed and stable and Patient moving all extremities X 4  Post vital signs: Reviewed and stable  Complications: No apparent anesthesia complications

## 2014-07-11 NOTE — Discharge Instructions (Signed)
DISCHARGE INSTRUCTIONS:   No vaginal entrance Call with heavy bleeding, Temp > 100.4 degrees or any problems  The following instructions have been prepared to help you care for yourself upon your return home.   Personal hygiene:  Use sanitary pads for vaginal drainage, not tampons.  Shower the day after your procedure.  NO tub baths, pools or Jacuzzis for 2-3 weeks.  Wipe front to back after using the bathroom.  Activity and limitations:  Do NOT drive or operate any equipment for 24 hours. The effects of anesthesia are still present and drowsiness may result.  Do NOT rest in bed all day.  Walking is encouraged.  Walk up and down stairs slowly.  You may resume your normal activity in one to two days or as indicated by your physician.  Sexual activity: NO intercourse for at least 2 weeks after the procedure, or as indicated by your physician.  Diet: Eat a light meal as desired this evening. You may resume your usual diet tomorrow.  Return to work: You may resume your work activities in one to two days or as indicated by your doctor.  What to expect after your surgery: Expect to have vaginal bleeding/discharge for 2-3 days and spotting for up to 10 days. It is not unusual to have soreness for up to 1-2 weeks. You may have a slight burning sensation when you urinate for the first day. Mild cramps may continue for a couple of days. You may have a regular period in 2-6 weeks.  Call your doctor for any of the following:  Excessive vaginal bleeding, saturating and changing one pad every hour.  Inability to urinate 6 hours after discharge from hospital.  Pain not relieved by pain medication.  Fever of 100.4 F or greater.  Unusual vaginal discharge or odor.   Call for an appointment:    Patients signature: ______________________  Nurses signature ________________________  Support person's signature_______________________   No vaginal entrance Call with heavy  bleeding, Temp > 100.4 degrees or any problems

## 2014-07-11 NOTE — ED Notes (Signed)
Report given to [place] RN at [facility]

## 2014-07-11 NOTE — ED Notes (Signed)
Pt reports her boyfriend is a Administrator, met him somewhere and had sex in the truck.  While having sex, pt began to have heavy vaginal bleeding and shortly after that began to have LLQ pain.  Continous heavy bleeding noted.  Pt reports passing large clots as well.  Pt denies any vaginal pain.

## 2014-07-11 NOTE — Anesthesia Preprocedure Evaluation (Addendum)
Anesthesia Evaluation  Patient identified by MRN, date of birth, ID band Patient awake    Reviewed: Allergy & Precautions, H&P , NPO status , Patient's Chart, lab work & pertinent test results  Airway Mallampati: II TM Distance: >3 FB Neck ROM: full    Dental no notable dental hx.    Pulmonary neg pulmonary ROS,    Pulmonary exam normal       Cardiovascular hypertension, Pt. on medications     Neuro/Psych negative neurological ROS  negative psych ROS   GI/Hepatic Neg liver ROS,   Endo/Other  diabetes, Well Controlled  Renal/GU      Musculoskeletal   Abdominal Normal abdominal exam  (+)   Peds  Hematology   Anesthesia Other Findings   Reproductive/Obstetrics (+) Pregnancy                          Anesthesia Physical Anesthesia Plan  ASA: II and emergent  Anesthesia Plan: General   Post-op Pain Management:    Induction: Intravenous  Airway Management Planned: LMA  Additional Equipment:   Intra-op Plan:   Post-operative Plan:   Informed Consent: I have reviewed the patients History and Physical, chart, labs and discussed the procedure including the risks, benefits and alternatives for the proposed anesthesia with the patient or authorized representative who has indicated his/her understanding and acceptance.     Plan Discussed with: CRNA and Surgeon  Anesthesia Plan Comments:         Anesthesia Quick Evaluation

## 2014-07-11 NOTE — H&P (Signed)
[name], [name]          ACCOUNT NO.:  [id number]  MEDICAL RECORD NO.:  [id number]  LOCATION:  WA17                         FACILITY:  [facility]  PHYSICIAN:  Lucille Passy. Ulanda Edison, M.D. DATE OF BIRTH:  1956/03/24  DATE OF ADMISSION:  07/11/2014 DATE OF DISCHARGE:                             HISTORY & PHYSICAL   PRESENT ILLNESS:  This is a 58 year old black female, para 3-0-0-3, who was admitted for surgical correction of heavy vaginal bleeding that occurred after intercourse.  The patient had a hysterectomy in 2004. She had sex tonight somewhere between 11:30 p.m. and 12:30 a.m., and shortly after her partner entered her vagina, he noted she was bleeding. She came to [facility] Emergency Room, was seen to have heavy vaginal bleeding.  A Kerlix pack was placed into the vagina.  I was called, examined the patient.  I did not remove the pack, but realized there was heavy bleeding that was somewhat controlled.  I call the operating room at [facility], they would have to call in the second crew, take 45 minutes to an hour.  I called [facility], they have an open room in the operating room, so I am transporting her to [place]:  No known allergies.  Illnesses:  Diabetes, high blood pressure, carpal tunnel syndrome, history of kidney cancer.  Operations:  Vaginal hysterectomy, left nephrectomy, gallbladder removal and right wrist surgery.  The patient is on disability from carpal tunnel syndrome.  Illnesses:  As stated, diabetes, high blood pressure.  MEDICATIONS: 1. Hydrochlorothiazide 12.5 mg a day. 2. Metformin 500 mg twice a day. 3. Insulin 10 units morning and evening.  FAMILY HISTORY:  Mother 64, living and well.  Father died of a motor vehicle accident.  Six sisters and four brothers are currently living and well.  The patient is retired.  She is on disability.  She retired from [employer].  She does not drink or  smoke.  PHYSICAL EXAMINATION:  VITAL SIGNS:  The patient's vital signs were recorded as normal. I'S AND O'S:  Normal. THROAT:  Normal. NECK:  Supple without thyromegaly. LUNGS:  Clear to auscultation. HEART:  Normal size and sounds.  No murmurs. ABDOMEN:  Soft.  There is tenderness to direct palpation in the left lower quadrant.  The patient states this has been present for couple of weeks.  I did not remove the Kerlix pack from the vagina.  There is no active bleeding through the pack at this point, but there is blood on the table underneath her buttocks.  ADMITTING IMPRESSION:  Vaginal bleeding after sexual intercourse.  The patient is admitted for surgical repair of that.  ADDITIONAL DIAGNOSES:  Diabetes, high blood pressure, obesity, history of kidney cancer in the left kidney.     Lucille Passy. Ulanda Edison, M.D.     TFH/MEDQ  D:  07/11/2014  T:  07/11/2014  Job:  [id number]

## 2014-07-11 NOTE — Op Note (Signed)
NAME:  [name], [name]          ACCOUNT NO.:  [id number]  MEDICAL RECORD NO.:  [id number]  LOCATION:  [facility]                          FACILITY:  [facility]  PHYSICIAN:  Lucille Passy. Ulanda Edison, M.D. DATE OF BIRTH:  [birth year]  DATE OF PROCEDURE:  07/11/2014 DATE OF DISCHARGE:                              OPERATIVE REPORT   PREOPERATIVE DIAGNOSIS:  Vaginal bleeding after intercourse, probably from a vaginal laceration.  POSTOPERATIVE DIAGNOSIS:  Vaginal bleeding after intercourse, probably from a vaginal laceration.  OPERATION:  Exam under anesthesia, suture of vaginal cuff laceration.  SURGEON:  Lucille Passy. Ulanda Edison, M.D.  ANESTHESIA:  General.  OPERATIVE PROCEDURE:  The patient was brought to the operating room, placed under satisfactory general anesthesia and in lithotomy position in the [place].  There was still some bleeding from the vagina, although was not as excessive as that had been in the emergency room. The vulva, vagina, and perineum were prepped with Betadine solution after the urethra was prepped and the bladder was emptied with a [place] catheter of clear urine.  The area was draped as a sterile field.  A speculum was placed posteriorly and anteriorly, and there was probably 4 cm wide laceration at the vaginal cuff.  However, the patient had a rectocele and cystocele and the vaginal cuff was only 2 inches from the vaginal opening.  The vaginal cuff was sutured with 4-0 five interrupted figure-of-eight sutures of 0 Vicryl.  There was no bleeding at the end of the procedure.  Sponge and needle counts were correct, and the patient was returned to recovery in satisfactory condition.  She does have diabetes and high blood pressure and marked obesity.  There was a lesion, possibly infected sebaceous cyst on the left buttock.  She does have impaired renal function with a creatinine of 1.1 and BUN of 27. Her blood sugar was 186.  Hemoglobin was normal.     Lucille Passy. Ulanda Edison,  M.D.     TFH/MEDQ  D:  07/11/2014  T:  07/11/2014  Job:  [id number]

## 2014-07-11 NOTE — Progress Notes (Signed)
Patient ID: [name], female   DOB: 1956-01-19, 58 y.o.   MRN: [id number] Op note:  Pre and post op dx: Vaginal bleeding after sex.  Op: Suture of vaginal cuff laceration  Op : Luvern Mischke  Anesthesia : General   EBL 20 cc's

## 2014-07-11 NOTE — Anesthesia Postprocedure Evaluation (Signed)
Anesthesia Post Note  Patient: [name]  Procedure(s) Performed: Procedure(s) (LRB): Repair of Vaginal Cuff (N/A)  Anesthesia type: General  Patient location: PACU  Post pain: Pain level controlled  Post assessment: Post-op Vital signs reviewed  Last Vitals:  Filed Vitals:   07/11/14 0715  BP: 146/98  Pulse: 108  Temp:   Resp: 16    Post vital signs: Reviewed  Level of consciousness: sedated  Complications: No apparent anesthesia complications

## 2014-07-11 NOTE — ED Notes (Signed)
Patient c/o LLQ abd pain, states this started about 1-2 hours ago and that she has began passing blood clots. Patient states she is passing large clots, that are bright red in color.

## 2014-07-11 NOTE — Telephone Encounter (Signed)
Diabetic Bundle. Pt is currently in the hospital. Unable to reach pt.

## 2014-07-14 ENCOUNTER — Encounter (HOSPITAL_COMMUNITY): Payer: Self-pay | Admitting: Obstetrics and Gynecology

## 2014-07-16 ENCOUNTER — Encounter (INDEPENDENT_AMBULATORY_CARE_PROVIDER_SITE_OTHER): Payer: Self-pay | Admitting: Surgery

## 2014-07-25 ENCOUNTER — Encounter: Payer: Self-pay | Admitting: Internal Medicine

## 2014-07-25 ENCOUNTER — Encounter: Payer: Self-pay | Admitting: Gastroenterology

## 2014-07-28 ENCOUNTER — Ambulatory Visit: Payer: BC Managed Care – PPO | Admitting: Internal Medicine

## 2014-08-26 ENCOUNTER — Telehealth: Payer: Self-pay | Admitting: Internal Medicine

## 2014-08-26 MED ORDER — INSULIN NPH (HUMAN) (ISOPHANE) 100 UNIT/ML ~~LOC~~ SUSP: SUBCUTANEOUS | Status: DC

## 2014-08-26 NOTE — Telephone Encounter (Signed)
Thank you :)

## 2014-08-26 NOTE — Telephone Encounter (Signed)
Pt Scheduled for appointment on 11/16. Pt's medication refilled.

## 2014-08-26 NOTE — Telephone Encounter (Signed)
See below and please on how to proceed, Thanks!

## 2014-08-26 NOTE — Telephone Encounter (Signed)
Patient would like to schedule an appointment with Dr. Cruzita Lederer asap  She states she has been out of insulin for 2 months There are no avail appointments till Nov    Please advise   Thank you

## 2014-08-26 NOTE — Telephone Encounter (Signed)
Let's schedule first available and please refill her ReliOn NPH until then

## 2014-08-27 ENCOUNTER — Other Ambulatory Visit: Payer: Self-pay | Admitting: *Deleted

## 2014-08-27 ENCOUNTER — Telehealth: Payer: Self-pay | Admitting: Internal Medicine

## 2014-08-27 MED ORDER — INSULIN NPH (HUMAN) (ISOPHANE) 100 UNIT/ML ~~LOC~~ SUSP: SUBCUTANEOUS | Status: DC

## 2014-08-27 NOTE — Telephone Encounter (Signed)
Could the alternative of novolog be called in to [facility] # [phone]  Before you do this though please call pt and advise her of the difference between the two and if it would be ok for her to switch from humalog to novolog. She does not have insurance coverage right now and novolog is only $24 where the humalog is $124

## 2014-08-27 NOTE — Telephone Encounter (Signed)
Called pt and advised her per Dr Arman Filter note. Pt said she is on Humulin N (not humalog). Advised we can send in a rx for Novolin N - Relion to [place] for her. Pt pleased.

## 2014-08-27 NOTE — Telephone Encounter (Signed)
Absolutely, no change in dose and how she takes it as they are almost identical.

## 2014-08-27 NOTE — Telephone Encounter (Signed)
Please read message below. Ok to change? Please advise.

## 2014-08-28 ENCOUNTER — Telehealth: Payer: Self-pay | Admitting: *Deleted

## 2014-08-28 ENCOUNTER — Telehealth: Payer: Self-pay | Admitting: Internal Medicine

## 2014-08-28 NOTE — Telephone Encounter (Signed)
pts sugar level right now is 512 please advise

## 2014-08-28 NOTE — Telephone Encounter (Signed)
Called pt and advised her per Dr Arman Filter note. Pt understood. Pt scheduled an appt to see Dr Cruzita Lederer tomorrow, per Dr Cruzita Lederer.

## 2014-08-28 NOTE — Telephone Encounter (Signed)
Called pt to get more details. She said that she slept a little later. She did 10 units of insulin this AM. For breakfast she had a banana, 1 pc of toast, a pc of veggie sausage with coffee (1/2 pk of sugar in the raw). She took her bg at noon. Pt can come in tomorrow, pt can. Please advise.

## 2014-08-28 NOTE — Telephone Encounter (Signed)
Pt called stating her bg was 512. Please advise.

## 2014-08-28 NOTE — Telephone Encounter (Signed)
I believe she is also taking 10 units of NovoLog at bedtime. Tonight please increase to 15 units.

## 2014-08-28 NOTE — Telephone Encounter (Signed)
Please try to find out more details about this, for e.g what dose of insulin is she on now, was this a fasting or post-meal sugar, what sugars she had before, etc. Drink plenty of water. She will need more insulin, but I need to find more details first.  Can I see her for an appt tomorrow at 11 am?

## 2014-08-28 NOTE — Telephone Encounter (Signed)
Pt calling has questions for shannon

## 2014-08-29 ENCOUNTER — Encounter: Payer: Self-pay | Admitting: Internal Medicine

## 2014-08-29 ENCOUNTER — Ambulatory Visit (INDEPENDENT_AMBULATORY_CARE_PROVIDER_SITE_OTHER): Payer: BC Managed Care – PPO | Admitting: Internal Medicine

## 2014-08-29 ENCOUNTER — Ambulatory Visit: Payer: BC Managed Care – PPO | Admitting: Internal Medicine

## 2014-08-29 ENCOUNTER — Other Ambulatory Visit (INDEPENDENT_AMBULATORY_CARE_PROVIDER_SITE_OTHER): Payer: BC Managed Care – PPO | Admitting: *Deleted

## 2014-08-29 VITALS — BP 160/100 | HR 93 | Temp 98.0°F | Resp 12 | Wt 220.0 lb

## 2014-08-29 DIAGNOSIS — E118 Type 2 diabetes mellitus with unspecified complications: Secondary | ICD-10-CM

## 2014-08-29 LAB — LIPID PANEL
CHOL/HDL RATIO: 7
Cholesterol: 212 mg/dL — ABNORMAL HIGH (ref 0–200)
HDL: 31.1 mg/dL — AB (ref >39.00)
LDL Cholesterol: 143 mg/dL — ABNORMAL HIGH (ref 0–99)
NONHDL: 180.9
Triglycerides: 192 mg/dL — ABNORMAL HIGH (ref 0.0–149.0)
VLDL: 38.4 mg/dL (ref 0.0–40.0)

## 2014-08-29 LAB — GLUCOSE, POCT (MANUAL RESULT ENTRY): POC GLUCOSE: 418 mg/dL — AB (ref 70–99)

## 2014-08-29 LAB — HEMOGLOBIN A1C: Hgb A1c MFr Bld: 12.5 % — ABNORMAL HIGH (ref 4.6–6.5)

## 2014-08-29 MED ORDER — INSULIN NPH (HUMAN) (ISOPHANE) 100 UNIT/ML ~~LOC~~ SUSP: SUBCUTANEOUS | Status: DC

## 2014-08-29 MED ORDER — ATORVASTATIN CALCIUM 20 MG PO TABS: 20.0000 mg | ORAL_TABLET | Freq: Every day | ORAL | Status: DC

## 2014-08-29 NOTE — Patient Instructions (Addendum)
Please continue NPH insulin 10 units in am and 15 units at bedtime. Decrease back to 10 units 2x a day if the sugars in am are too low.   Please return in 1.5 month with your sugar log.

## 2014-08-29 NOTE — Progress Notes (Signed)
Patient ID: [name], female   DOB: 20-Jun-1956, 58 y.o.   MRN: [id number]  HPI: [name] is a 58 y.o.-year-old female, returning for management of DM2, dx 1995, insulin-dependent, uncontrolled, with complications (CKD, retinopathy). Last visit 6 mo ago.  Last hemoglobin A1c was: Lab Results  Component Value Date   HGBA1C 7.8* 01/06/2014   HGBA1C 12.5* 09/26/2013   HGBA1C 11.3* 06/14/2013   She is now in the regimen of: - NPH 10 units 2x a day >> she ran out 3 weeks ago >> she called [place] for sugars in the 500s >> we re-sent the Rx >> we increased the NPH to 10 units in am and 15 units at bedtime. She stopped Metformin 1000 mg bid We stopped Cycloset 2.4 mg >> of nausea/vomiting.  She was on Humalog 6 units with largest meal of the day >> stopped We had to stop Metformin as she has a high creatinine and we stopped Victoza as she developed pancreatitis: AP and nausea and was on liquids for 2 weeks.  She has been on JanuMet, Metformin, Glyburide. She was on and off meds due to insurance - started to have insurance again on 02/2013.  All her meds are free now.  Pt checks her sugars 2x a day and they are (no log) - sugars were great before she ran out of insulin (highest 189), off insulin 100-200:  - am: 150-250 >> 109-180 >> 200-336 >> 87-129 >> 74-109 >> mostly up to 140 >> 129-135 - after b'fast: 120 and 218 >> n/c - lunch: 120s >> 278-336 >> 80-130 >> n/c >> 90-100 >> 135 - 2h after lunch: 191 >> n/c - before dinner: 120-130 >> not checking >> 115-134 >> 120 >> low 100s >> low 100s - 1h after dinner: 180 >> not checking >> n/c - bedtime: 105-152 >> n/c No lows; she has hypoglycemia awareness at 90-100.   Pt's meals are  -reduced portions, more salads: - Breakfast: coffee (raw sugar) + egg + toast - Lunch: blackened salmon (baked) + string beans (no desert) - Dinner: baked chicken - free range/turkey - free range/ribeye + vegetables  - Snacks: graham crackers with PB,  apples, cherry No juice or sodas.   - Has CKD, last BUN/creatinine:  Lab Results  Component Value Date   BUN 27* 07/11/2014   CREATININE 1.11* 07/11/2014  She did not tolerate ACE inhibitors in the past. - last set of lipids: Lab Results  Component Value Date   CHOL 165 03/13/2013   HDL 44.70 03/13/2013   LDLCALC 97 03/13/2013   TRIG 118.0 03/13/2013   CHOLHDL 4 03/13/2013  She is not on a statin. - last eye exam was in 07/02/2013 (Dr Baird Cancer) >> + DR.  - no numbness and tingling in her feet.  I reviewed her chart and she also has a history of poorly controlled hypertension, history of renal cell carcinoma-status post nephrectomy, morbid obesity, IBS.  ROS: Constitutional: no weight gain/loss, no fatigue, no subjective hyperthermia/hypothermia, poor sleep Eyes: no blurry vision, no xerophthalmia ENT: no sore throat, no nodules palpated in throat, no dysphagia/odynophagia, no hoarseness Cardiovascular: no CP/SOB/palpitations/leg swelling Respiratory: no cough/SOB Gastrointestinal: no N/V/+D/no C Musculoskeletal: no muscle/joint aches Skin: no rashes +HA I reviewed pt's medications, allergies, PMH, social hx, family hx and no changes required, except starting mealtime insulin as mentioned above.  PE: BP 160/100  Pulse 93  Temp(Src) 98 F (36.7 C) (Oral)  Resp 12  Wt 220 lb (99.791 kg)  SpO2  95%  LMP 11/16/2002 Wt Readings from Last 3 Encounters:  08/29/14 220 lb (99.791 kg)  07/11/14 229 lb (103.874 kg)  07/11/14 229 lb (103.874 kg)   Constitutional: obese, in NAD Eyes: PERRLA, EOMI, no exophthalmos ENT: moist mucous membranes, no thyromegaly, no cervical lymphadenopathy Cardiovascular: RRR, No MRG Respiratory: CTA B Gastrointestinal: abdomen soft, NT, ND, BS+ Musculoskeletal: no deformities, strength intact in all 4 Skin: moist, warm, acanthosis nigricans on neck   ASSESSMENT: 1. DM2, insulin-dependent, uncontrolled, with complications - diabetic nephropathy -  DR  H/o pancreatitis episode (Stopped Victoza)  2. CKD - much improved - sees Dr Clover Mealy - h/o Kidney cancer >> L kidney removed >> Dr Risa Grill >> returns to see him in 11/2012  PLAN:  1. Patient with long-standing, uncontrolled diabetes, returning after a long absence.  Off Metformin b/c side effects >> diarrhea.  Recent very high sugars (500s) after being off insulin x 3 weeks! She restarted it yesterday.  - I advised her to: Patient Instructions  Please continue NPH insulin 10 units in am and 15 units at bedtime. Decrease back to 10 units 2x a day if the sugars in am are too low.  Please return in 1.5 month with your sugar log.  - continue checking sugars at different times of the day - check 2 times a day, rotating checks - check HbA1c and lipids today - refuses flu vaccine - needs a new eye exam - Return to clinic in 1.5 months with sugar log   Orders Only on 08/29/2014  Component Date Value Ref Range Status  . POC Glucose 08/29/2014 418* 70 - 99 mg/dl Final  Office Visit on 08/29/2014  Component Date Value Ref Range Status  . Hemoglobin A1C 08/29/2014 12.5* 4.6 - 6.5 % Final   Glycemic Control Guidelines for People with Diabetes:Non Diabetic:  <6%Goal of Therapy: <7%Additional Action Suggested:  >8%   . Cholesterol 08/29/2014 212* 0 - 200 mg/dL Final   ATP III Classification       Desirable:  < 200 mg/dL               Borderline High:  200 - 239 mg/dL          High:  > = 240 mg/dL  . Triglycerides 08/29/2014 192.0* 0.0 - 149.0 mg/dL Final   Normal:  <150 mg/dLBorderline High:  150 - 199 mg/dL  . HDL 08/29/2014 31.10* >39.00 mg/dL Final  . VLDL 08/29/2014 38.4  0.0 - 40.0 mg/dL Final  . LDL Cholesterol 08/29/2014 143* 0 - 99 mg/dL Final  . Total CHOL/HDL Ratio 08/29/2014 7   Final                  Men          Women1/2 Average Risk     3.4          3.3Average Risk          5.0          4.42X Average Risk          9.6          7.13X Average Risk          15.0          11.0                       . NonHDL 08/29/2014 180.90   Final   NOTE:  Non-HDL goal should be 30 mg/dL higher than patient's  LDL goal (i.e. LDL goal of < 70 mg/dL, would have non-HDL goal of < 100 mg/dL)   High CBG, HbA1c and LDL. Restarted insulin - see above.  Will encourage her to start a statin >> will send Lipitor 20 mg daily to her pharmacy.

## 2014-08-29 NOTE — Telephone Encounter (Signed)
Pt to come in on Friday.

## 2014-09-01 ENCOUNTER — Encounter: Payer: Self-pay | Admitting: *Deleted

## 2014-09-15 ENCOUNTER — Encounter: Payer: Self-pay | Admitting: Internal Medicine

## 2014-09-26 ENCOUNTER — Encounter: Payer: Self-pay | Admitting: Family Medicine

## 2014-09-26 ENCOUNTER — Ambulatory Visit (INDEPENDENT_AMBULATORY_CARE_PROVIDER_SITE_OTHER): Payer: Self-pay | Admitting: Family Medicine

## 2014-09-26 VITALS — BP 142/92 | HR 81 | Temp 97.9°F | Resp 16 | Wt 221.0 lb

## 2014-09-26 DIAGNOSIS — S29011A Strain of muscle and tendon of front wall of thorax, initial encounter: Secondary | ICD-10-CM | POA: Insufficient documentation

## 2014-09-26 MED ORDER — KETOROLAC TROMETHAMINE 60 MG/2ML IM SOLN
60.0000 mg | Freq: Once | INTRAMUSCULAR | Status: AC
  Administered 2014-09-26: 60 mg via INTRAMUSCULAR

## 2014-09-26 MED ORDER — NAPROXEN 500 MG PO TABS: 500.0000 mg | ORAL_TABLET | Freq: Two times a day (BID) | ORAL | Status: DC

## 2014-09-26 MED ORDER — CYCLOBENZAPRINE HCL 10 MG PO TABS: 10.0000 mg | ORAL_TABLET | Freq: Every day | ORAL | Status: DC

## 2014-09-26 NOTE — Patient Instructions (Signed)
Follow up as needed Start the Naproxen twice daily- take w/ food- for 5-7 days and then as needed for pain/inflammation Use the Cyclobenzaprine before bed- this is a muscle relaxer HEAT!! Gentle stretching to avoid stiffness- pull arms back in rowing motion until you feel it pull and hold it there Call with any questions or concerns Hang in there!!!

## 2014-09-26 NOTE — Progress Notes (Signed)
   Subjective:    Patient ID: [name], female    DOB: 05/24/1956, 58 y.o.   MRN: [id number]  HPI Burning sensation above breast- L sided, 1st started ~4 weeks ago.  Now TTP.  Pt pressed a case of bottled water against chest to open door w/ opposite hand.  Some difficulty raising L arm due to pain.  Pain is constant- improves w/ heat.   Review of Systems For ROS see HPI     Objective:   Physical Exam  Constitutional: She is oriented to person, place, and time. She appears well-developed and well-nourished. No distress.  Cardiovascular: Intact distal pulses.   Pulmonary/Chest:  No breast tenderness  Musculoskeletal: She exhibits tenderness (over insertion of L pec and pectoralis muscle).  Neurological: She is alert and oriented to person, place, and time. She has normal reflexes. Coordination normal.  Skin: Skin is warm and dry. No rash noted. No erythema.  Vitals reviewed.         Assessment & Plan:

## 2014-09-26 NOTE — Assessment & Plan Note (Signed)
New.  Pt's sxs and PE consistent w/ pec strain.  Start scheduled NSAIDs, heat, flexeril QHS.  Reviewed supportive care and red flags that should prompt return.  Pt expressed understanding and is in agreement w/ plan.

## 2014-09-26 NOTE — Progress Notes (Signed)
Pre visit review using our clinic review tool, if applicable. No additional management support is needed unless otherwise documented below in the visit note. 

## 2014-09-29 ENCOUNTER — Ambulatory Visit: Payer: BC Managed Care – PPO | Admitting: Internal Medicine

## 2014-11-13 ENCOUNTER — Encounter: Payer: Self-pay | Admitting: Internal Medicine

## 2014-12-01 ENCOUNTER — Other Ambulatory Visit: Payer: Self-pay | Admitting: General Practice

## 2014-12-01 ENCOUNTER — Encounter: Payer: Self-pay | Admitting: General Practice

## 2014-12-01 MED ORDER — HYDROCHLOROTHIAZIDE 12.5 MG PO CAPS: 12.5000 mg | ORAL_CAPSULE | Freq: Every day | ORAL | Status: DC

## 2014-12-30 NOTE — Telephone Encounter (Signed)
error 

## 2015-01-05 ENCOUNTER — Other Ambulatory Visit: Payer: Self-pay | Admitting: General Practice

## 2015-01-05 MED ORDER — HYDROCHLOROTHIAZIDE 12.5 MG PO CAPS: 12.5000 mg | ORAL_CAPSULE | Freq: Every day | ORAL | Status: DC

## 2015-01-19 ENCOUNTER — Ambulatory Visit: Payer: Self-pay

## 2015-01-21 ENCOUNTER — Ambulatory Visit (INDEPENDENT_AMBULATORY_CARE_PROVIDER_SITE_OTHER): Payer: Self-pay | Admitting: Family Medicine

## 2015-01-21 ENCOUNTER — Encounter: Payer: Self-pay | Admitting: Family Medicine

## 2015-01-21 VITALS — BP 136/78 | HR 88 | Temp 97.6°F | Resp 14 | Ht 61.0 in | Wt 224.4 lb

## 2015-01-21 DIAGNOSIS — J0100 Acute maxillary sinusitis, unspecified: Secondary | ICD-10-CM | POA: Insufficient documentation

## 2015-01-21 MED ORDER — PROMETHAZINE-DM 6.25-15 MG/5ML PO SYRP
5.0000 mL | ORAL_SOLUTION | Freq: Four times a day (QID) | ORAL | Status: DC | PRN
Start: 2015-01-21 — End: 2015-03-26

## 2015-01-21 MED ORDER — AMOXICILLIN 875 MG PO TABS: 875.0000 mg | ORAL_TABLET | Freq: Two times a day (BID) | ORAL | Status: DC

## 2015-01-21 NOTE — Progress Notes (Signed)
   Subjective:    Patient ID: [name], female    DOB: 1956/10/18, 59 y.o.   MRN: [id number]  HPI URI- sxs started 2 weeks ago.  + chills, chest congestion, nasal congestion.  + maxillary sinus pressure.  Bilateral ear fullness.  No tooth pain.  + nausea, no vomiting.  Cough is productive- white sputum.  + sick contacts.   Review of Systems For ROS see HPI     Objective:   Physical Exam  Constitutional: She appears well-developed and well-nourished. No distress.  HENT:  Head: Normocephalic and atraumatic.  Right Ear: Tympanic membrane normal.  Left Ear: Tympanic membrane normal.  Nose: Mucosal edema and rhinorrhea present. Right sinus exhibits maxillary sinus tenderness and frontal sinus tenderness. Left sinus exhibits maxillary sinus tenderness and frontal sinus tenderness.  Mouth/Throat: Uvula is midline and mucous membranes are normal. Posterior oropharyngeal erythema present. No oropharyngeal exudate.  Eyes: Conjunctivae and EOM are normal. Pupils are equal, round, and reactive to light.  Neck: Normal range of motion. Neck supple.  Cardiovascular: Normal rate, regular rhythm and normal heart sounds.   Pulmonary/Chest: Effort normal and breath sounds normal. No respiratory distress. She has no wheezes.  Lymphadenopathy:    She has no cervical adenopathy.  Vitals reviewed.         Assessment & Plan:

## 2015-01-21 NOTE — Patient Instructions (Signed)
Follow up as needed Start the Amoxicillin twice daily- take w/ food Use the cough syrup as needed- will cause drowsiness Mucinex DM for daytime cough Drink plenty of fluids REST! Call with any questions or concerns Hang in there!!!

## 2015-01-21 NOTE — Progress Notes (Signed)
Pre visit review using our clinic review tool, if applicable. No additional management support is needed unless otherwise documented below in the visit note. 

## 2015-01-22 NOTE — Assessment & Plan Note (Signed)
New.  Pt's sxs and PE consistent w/ infxn.  Start abx.  Cough meds prn.  Reviewed supportive care and red flags that should prompt return.  Pt expressed understanding and is in agreement w/ plan.

## 2015-02-03 ENCOUNTER — Other Ambulatory Visit: Payer: Self-pay | Admitting: General Practice

## 2015-02-03 MED ORDER — HYDROCHLOROTHIAZIDE 12.5 MG PO CAPS: 12.5000 mg | ORAL_CAPSULE | Freq: Every day | ORAL | Status: DC

## 2015-02-05 ENCOUNTER — Telehealth: Payer: Self-pay | Admitting: Family Medicine

## 2015-02-05 ENCOUNTER — Other Ambulatory Visit: Payer: Self-pay | Admitting: General Practice

## 2015-02-05 MED ORDER — HYDROCHLOROTHIAZIDE 12.5 MG PO CAPS: 12.5000 mg | ORAL_CAPSULE | Freq: Every day | ORAL | Status: DC

## 2015-02-05 NOTE — Telephone Encounter (Signed)
Needs refill to [facility] [address] for hydrochlorithiazide 12.5  She took her last one this morning

## 2015-02-05 NOTE — Telephone Encounter (Signed)
This medication was filled today.   

## 2015-03-09 ENCOUNTER — Other Ambulatory Visit: Payer: Self-pay | Admitting: Family Medicine

## 2015-03-09 NOTE — Telephone Encounter (Signed)
Med filled. Letter mailed to pt to schedule a BP follow up.

## 2015-03-10 ENCOUNTER — Telehealth: Payer: Self-pay | Admitting: Family Medicine

## 2015-03-10 MED ORDER — HYDROCHLOROTHIAZIDE 12.5 MG PO CAPS: 12.5000 mg | ORAL_CAPSULE | Freq: Every day | ORAL | Status: DC

## 2015-03-10 NOTE — Telephone Encounter (Signed)
Relation to pt: self Call back number: [phone]   Reason for call:  Pt requesting a refill hydrochlorothiazide (MICROZIDE) 12.5 MG capsule. Pt states she is on a fixed income and can not schedule appointment at this time

## 2015-03-10 NOTE — Telephone Encounter (Signed)
Ok for #30 but pt needs to at least have an OV for labs and discussion of current txs

## 2015-03-10 NOTE — Telephone Encounter (Signed)
Ok to fill? Pt was seen on 01/2015 for a sinus infection. Pt has not had a follow up appointment with you since 2014. Last BMP was in the hospital 03/2014.

## 2015-03-10 NOTE — Telephone Encounter (Signed)
Med filled.  

## 2015-03-12 NOTE — Telephone Encounter (Signed)
Pt was advised to schedule appointment informed pt office will work with her if she does not have co pay at the time of visit. Pt stated she will call back to schedule appointment in May.

## 2015-03-26 ENCOUNTER — Ambulatory Visit (INDEPENDENT_AMBULATORY_CARE_PROVIDER_SITE_OTHER): Payer: Self-pay | Admitting: Family Medicine

## 2015-03-26 ENCOUNTER — Encounter: Payer: Self-pay | Admitting: Family Medicine

## 2015-03-26 VITALS — BP 168/82 | HR 87 | Temp 98.0°F | Resp 16 | Wt 227.4 lb

## 2015-03-26 DIAGNOSIS — I10 Essential (primary) hypertension: Secondary | ICD-10-CM

## 2015-03-26 LAB — HM PAP SMEAR: HM PAP: NORMAL

## 2015-03-26 MED ORDER — AMLODIPINE BESYLATE 5 MG PO TABS: 5.0000 mg | ORAL_TABLET | Freq: Every day | ORAL | Status: DC

## 2015-03-26 NOTE — Progress Notes (Signed)
Pre visit review using our clinic review tool, if applicable. No additional management support is needed unless otherwise documented below in the visit note. 

## 2015-03-26 NOTE — Progress Notes (Signed)
   Subjective:    Patient ID: [name], female    DOB: Oct 03, 1956, 59 y.o.   MRN: [id number]  HPI HTN- chronic problem, on HCTZ but BP remains elevated.  At GYN, BP was 190/110 this AM.  Pt reports HA x1 week.  Taking HCTZ daily.  Pt has 1 kidney and Cr increased on ACE.  No CP, SOB, visual changes, edema.   Review of Systems For ROS see HPI     Objective:   Physical Exam  Constitutional: She is oriented to person, place, and time. She appears well-developed and well-nourished. No distress.  HENT:  Head: Normocephalic and atraumatic.  Eyes: Conjunctivae and EOM are normal. Pupils are equal, round, and reactive to light.  Neck: Normal range of motion. Neck supple. No thyromegaly present.  Cardiovascular: Normal rate, regular rhythm, normal heart sounds and intact distal pulses.   No murmur heard. Pulmonary/Chest: Effort normal and breath sounds normal. No respiratory distress.  Abdominal: Soft. She exhibits no distension. There is no tenderness.  Musculoskeletal: She exhibits no edema.  Lymphadenopathy:    She has no cervical adenopathy.  Neurological: She is alert and oriented to person, place, and time.  Skin: Skin is warm and dry.  Psychiatric: She has a normal mood and affect. Her behavior is normal.  Vitals reviewed.         Assessment & Plan:

## 2015-03-26 NOTE — Assessment & Plan Note (Signed)
Deteriorated.  BP is much higher than previous.  + HA.  No CP, SOB.  No ACE due to solitary kidney and previous elevation in Cr.  Will start Amlodipine daily and follow closely.  Check BMP.  Reviewed supportive care and red flags that should prompt return.  Pt expressed understanding and is in agreement w/ plan.

## 2015-03-26 NOTE — Patient Instructions (Signed)
Follow up in 3-4 weeks to recheck BP We'll notify you of your lab results and make any changes if needed Continue the HCTZ daily ADD the Amlodipine Make sure you are eating a low salt diet- this will improve your blood pressure Drink plenty of fluids Call with any questions or concerns Hang in there!

## 2015-03-27 ENCOUNTER — Telehealth: Payer: Self-pay | Admitting: Family Medicine

## 2015-03-27 LAB — BASIC METABOLIC PANEL
BUN: 30 mg/dL — ABNORMAL HIGH (ref 6–23)
CHLORIDE: 99 meq/L (ref 96–112)
CO2: 25 meq/L (ref 19–32)
CREATININE: 1.09 mg/dL (ref 0.40–1.20)
Calcium: 9.3 mg/dL (ref 8.4–10.5)
GFR: 66.44 mL/min (ref >60.00)
GLUCOSE: 308 mg/dL — AB (ref 70–99)
Potassium: 4 mEq/L (ref 3.5–5.1)
SODIUM: 132 meq/L — AB (ref 135–145)

## 2015-03-27 NOTE — Telephone Encounter (Signed)
Relation to pt: self Call back number: [phone]   Reason for call:  Pt returning your call regarding lab results

## 2015-03-27 NOTE — Telephone Encounter (Signed)
Called and spoke with pt in regards to results. See result note.

## 2015-04-06 ENCOUNTER — Other Ambulatory Visit: Payer: Self-pay | Admitting: Family Medicine

## 2015-04-06 NOTE — Telephone Encounter (Signed)
Med filled.  

## 2015-04-17 ENCOUNTER — Other Ambulatory Visit: Payer: Self-pay | Admitting: General Practice

## 2015-04-17 ENCOUNTER — Encounter: Payer: Self-pay | Admitting: Family Medicine

## 2015-04-17 ENCOUNTER — Ambulatory Visit (INDEPENDENT_AMBULATORY_CARE_PROVIDER_SITE_OTHER): Payer: Self-pay | Admitting: Family Medicine

## 2015-04-17 VITALS — BP 140/82 | HR 91 | Temp 98.0°F | Resp 16 | Wt 230.0 lb

## 2015-04-17 DIAGNOSIS — I10 Essential (primary) hypertension: Secondary | ICD-10-CM

## 2015-04-17 MED ORDER — AMLODIPINE BESYLATE 10 MG PO TABS: 10.0000 mg | ORAL_TABLET | Freq: Every day | ORAL | Status: DC

## 2015-04-17 NOTE — Progress Notes (Signed)
Pre visit review using our clinic review tool, if applicable. No additional management support is needed unless otherwise documented below in the visit note. 

## 2015-04-17 NOTE — Assessment & Plan Note (Signed)
Chronic problem.  BP improved today but still not at goal w/ her diabetes.  Increase amlodipine to 10mg  daily.  Pt has appt upcoming w/ Dr Cruzita Lederer- BP will be checked then.  Pt expressed understanding and is in agreement w/ plan.

## 2015-04-17 NOTE — Assessment & Plan Note (Signed)
Refer back to diabetes nutrition center at pt's request.  Will follow.

## 2015-04-17 NOTE — Patient Instructions (Signed)
Schedule your complete physical in 6 months Have Dr Cruzita Lederer check your BP We'll call you with your nutrition appt Increase the Amlodipine to 10mg - 2 of what you have at home, and 1 of the new prescription Call with any questions or concerns Have a great weekend!!!

## 2015-04-17 NOTE — Progress Notes (Signed)
   Subjective:    Patient ID: [name], female    DOB: [birth year], 59 y.o.   MRN: [id number]  HPI HTN- chronic problem.  Pt was started on Amlodipine at last visit.  BP is much improved today.  No longer having throbbing HA.  No CP, SOB, visual changes, edema.  Obesity- pt interested in seeing nutrition.   Review of Systems For ROS see HPI     Objective:   Physical Exam  Constitutional: She is oriented to person, place, and time. She appears well-developed and well-nourished. No distress.  HENT:  Head: Normocephalic and atraumatic.  Eyes: Conjunctivae and EOM are normal. Pupils are equal, round, and reactive to light.  Neck: Normal range of motion. Neck supple. No thyromegaly present.  Cardiovascular: Normal rate, regular rhythm, normal heart sounds and intact distal pulses.   No murmur heard. Pulmonary/Chest: Effort normal and breath sounds normal. No respiratory distress.  Abdominal: Soft. She exhibits no distension. There is no tenderness.  Musculoskeletal: She exhibits no edema.  Lymphadenopathy:    She has no cervical adenopathy.  Neurological: She is alert and oriented to person, place, and time.  Skin: Skin is warm and dry.  Psychiatric: She has a normal mood and affect. Her behavior is normal.  Vitals reviewed.         Assessment & Plan:

## 2015-04-22 ENCOUNTER — Encounter: Payer: Self-pay | Attending: Internal Medicine | Admitting: Skilled Nursing Facility1

## 2015-04-22 ENCOUNTER — Encounter: Payer: Self-pay | Admitting: Skilled Nursing Facility1

## 2015-04-22 DIAGNOSIS — Z6841 Body Mass Index (BMI) 40.0 and over, adult: Secondary | ICD-10-CM | POA: Insufficient documentation

## 2015-04-22 DIAGNOSIS — Z713 Dietary counseling and surveillance: Secondary | ICD-10-CM | POA: Insufficient documentation

## 2015-04-22 NOTE — Progress Notes (Signed)
  Medical Nutrition Therapy:  Appt start time: 1400 end time:  1500.   Assessment:  Primary concerns today: Pt would like to better manage her diabetes and lose weight. Pts A1C is 12.5. Pt states she knows a lot about diabetes and carbohydrates but she needs a developed plan for what to eat. Pt states she cut out a lot of carbohydrates from her diet and states she deserves to have cheat days and wants to be given a strict meal plan but also states she tried to eat chicken and salad every day but then needed different foods. Pt states her Daughter is in remission for breast cancer. Pt states she wants to get back to the [facility] and start working out again and original stopped because of her daughters diagnosis. Pt states her highest weight was 257 pounds and then got down to 219 pounds and did this by being sick a lot of the time from diverticulitis (last flare being in 2010). Pts diet hx: kept a food diary, Pt states she got her original diabetes education from [facility] years ago when it was at a different location. Pt states she had kidney cancer and had one kidney removed. Pt states she is tired all the time. Pt states she gives in to cravings very often and as all black women do not take care of themselves she is trying to do the opposite of that.  Preferred Learning Style:   Auditory  Learning Readiness:   Not ready  MEDICATIONS: See List   DIETARY INTAKE:  Usual eating pattern includes 2 meals and 2 snacks per day.  Everyday foods include fruit.  Avoided foods include white sugar, red meat, and carbohydrates.    24-hr recall:  B ( AM): wheat toast, egg, 2 [place] bacon   Snk ( AM): fruit  L ( PM): none Snk ( PM): fruit D ( PM): chicken---turkey burger, vegetables (peas, beans) Snk ( PM): none Beverages: coffee, water, lemonade, orange juice  *Meals outside of the home: 5 days a week   Usual physical activity: walking 15 minutes 2 days a week  Estimated energy needs: 1600 calories 180  g carbohydrates 120 g protein 44 g fat  Progress Towards Goal(s):  In progress.   Nutritional Diagnosis:  NB-1.1 Food and nutrition-related knowledge deficit As related to nutrition education having occured many years ago.  As evidenced by A1C 12.5, lack of carbohydrate in her diet, pt report, and BMI 41.52.    Intervention:  Nutrition Counseling for obesity. Dietitian educated the pt on the importance of carbohydrates, physical activity, balanced meals, and honoring her hunger/fullness cues.  Goals: -Try to be physically active 3 days a week for 15 minutes -Try to eat 3 meals a day and 2-3 snacks every day -Have all your meals be balanced -Carbohydrates are good! -All fruits are carbohydrates  Teaching Method Utilized:  Visual Auditory  Handouts given during visit include:  15 gram carbohydrate snack sheet  Diabetes guide book  Yellow carb counting card    Demonstrated degree of understanding via:  Teach Back   Monitoring/Evaluation:  Dietary intake, exercise, A1C, lipid panel, and body weight prn. For next time: CHO counting

## 2015-04-22 NOTE — Patient Instructions (Signed)
-  Try to be physically active 3 days a week for 15 minutes -Try to eat 3 meals a day and 2-3 snacks every day -Have all your meals be balanced -Carbohydrates are good! -All fruits are carbohydrates

## 2015-06-03 ENCOUNTER — Ambulatory Visit: Payer: Self-pay | Admitting: Skilled Nursing Facility1

## 2015-06-04 LAB — HM DIABETES EYE EXAM

## 2015-06-05 ENCOUNTER — Ambulatory Visit: Payer: Self-pay | Admitting: Skilled Nursing Facility1

## 2015-06-11 ENCOUNTER — Other Ambulatory Visit: Payer: Self-pay | Admitting: Orthopedic Surgery

## 2015-06-15 ENCOUNTER — Encounter: Payer: Self-pay | Admitting: Internal Medicine

## 2015-06-15 ENCOUNTER — Ambulatory Visit (INDEPENDENT_AMBULATORY_CARE_PROVIDER_SITE_OTHER): Payer: Self-pay | Admitting: Internal Medicine

## 2015-06-15 DIAGNOSIS — E1165 Type 2 diabetes mellitus with hyperglycemia: Secondary | ICD-10-CM

## 2015-06-15 DIAGNOSIS — IMO0002 Reserved for concepts with insufficient information to code with codable children: Secondary | ICD-10-CM

## 2015-06-15 DIAGNOSIS — E11319 Type 2 diabetes mellitus with unspecified diabetic retinopathy without macular edema: Secondary | ICD-10-CM

## 2015-06-15 MED ORDER — INSULIN NPH (HUMAN) (ISOPHANE) 100 UNIT/ML ~~LOC~~ SUSP: SUBCUTANEOUS | Status: DC

## 2015-06-15 NOTE — Progress Notes (Signed)
Patient ID: [name], female   DOB: 1956/01/01, 59 y.o.   MRN: [id number]  HPI: [name] is a 59 y.o.-year-old female, returning for management of DM2, dx 1995, insulin-dependent, uncontrolled, with complications (CKD, retinopathy). Last visit 10 mo ago!  Patient is noncompliant with her insulin regimen and visits. This is an urgent appointment scheduled for sugars in the 400s. At last visit, she again called to schedule an appointment for high sugars in the 500s after she ran out of insulin 3 weeks prior to the appointment  Last hemoglobin A1c was: Lab Results  Component Value Date   HGBA1C 12.5* 08/29/2014   HGBA1C 7.8* 01/06/2014   HGBA1C 12.5* 09/26/2013   She is now in the regimen of: - NPH 10 units in am and 15 units at bedtime (vials0 She stopped Metformin 1000 mg bid We stopped Cycloset 2.4 mg >> of nausea/vomiting.  She was on Humalog 6 units with largest meal of the day >> stopped We had to stop Metformin as she has a high creatinine and we stopped Victoza as she developed pancreatitis: AP and nausea and was on liquids for 2 weeks.  She has been on JanuMet, Metformin, Glyburide. She was on and off meds due to insurance - started to have insurance again on 02/2013.  All her meds are free now.  Pt checks her sugars 2x a day and they are (no log): - am: 87-129 >> 74-109 >> mostly up to 140 >> 129-135 >> 150-200, then 200-300s, 400s - after b'fast: 120 and 218 >> n/c - lunch: 120s >> 278-336 >> 80-130 >> n/c >> 90-100 >> 135 >> n/c - 2h after lunch: 191 >> n/c >> 189-200 - before dinner: 120-130 >> not checking >> 115-134 >> 120 >> low 100s >> low 100s >> n/c - 1h after dinner: 180 >> not checking >> n/c - bedtime: 105-152 >> n/c >> 189-210 No lows; she has hypoglycemia awareness at 90-100.   Pt's meals are  -reduced portions, more salads: - Breakfast: coffee (raw sugar) + egg + toast - Lunch: blackened salmon (baked) + string beans (no desert) - Dinner:  baked chicken - free range/turkey - free range/ribeye + vegetables  - Snacks: graham crackers with PB, apples, cherry No juice or sodas.   She has a [employer].  - Has CKD, last BUN/creatinine:  Lab Results  Component Value Date   BUN 30* 03/26/2015   CREATININE 1.09 03/26/2015  She did not tolerate ACE inhibitors in the past. - last set of lipids: Lab Results  Component Value Date   CHOL 212* 08/29/2014   HDL 31.10* 08/29/2014   LDLCALC 143* 08/29/2014   TRIG 192.0* 08/29/2014   CHOLHDL 7 08/29/2014  She is not on a statin. - last eye exam was in 05/2015 (Dr Baird Cancer) >> + DR >> sent to a retina specialist - no numbness and tingling in her feet.  I reviewed her chart and she also has a history of poorly controlled hypertension, history of renal cell carcinoma-status post nephrectomy, morbid obesity, IBS.  ROS: Constitutional: no weight gain/loss, no fatigue, + subjective hyperthermia/no hypothermia, + nocturia Eyes: no blurry vision, no xerophthalmia ENT: no sore throat, no nodules palpated in throat, no dysphagia/odynophagia, no hoarseness Cardiovascular: no CP/SOB/palpitations/leg swelling Respiratory: no cough/SOB Gastrointestinal: + N/no V/D/C Musculoskeletal: no muscle/+ joint aches Skin: no rashes  I reviewed pt's medications, allergies, PMH, social hx, family hx, and changes were documented in the history of present illness. Otherwise, unchanged from my initial  visit note.  PE: BP 150/88 mmHg  Pulse 109  Temp(Src) 98 F (36.7 C) (Oral)  Resp 18  Ht 5\' 2"  (1.575 m)  Wt 229 lb (103.874 kg)  BMI 41.87 kg/m2  SpO2 94%  LMP 11/16/2002 Wt Readings from Last 3 Encounters:  06/15/15 229 lb (103.874 kg)  04/22/15 227 lb (102.967 kg)  04/17/15 230 lb (104.327 kg)   Constitutional: obese, in NAD Eyes: PERRLA, EOMI, no exophthalmos ENT: moist mucous membranes, no thyromegaly, no cervical lymphadenopathy Cardiovascular: RRR, No MRG Respiratory: CTA B Gastrointestinal:  abdomen soft, NT, ND, BS+ Musculoskeletal: no deformities, strength intact in all 4 Skin: moist, warm, acanthosis nigricans on neck  ASSESSMENT: 1. DM2, insulin-dependent, uncontrolled, with complications - diabetic nephropathy - DR  H/o pancreatitis episode (Stopped Victoza)  2. CKD - much improved - sees Dr Moshe Cipro - h/o Kidney cancer >> L kidney removed >> Dr Risa Grill >> returns to see him in 11/2012  PLAN:  1. Patient with long-standing, uncontrolled diabetes, again returning after a long absence.  Recent very high sugars but she does not bring a log or her meter. Mentions she did not miss insulin doses. I still believe this may be a compliance issue... Will attempt to increase NPH and will add R insulin at next visit if needed. - I advised her to: Patient Instructions  Please increase NPH to: - 15 units in am and 20 units at bedtime  - but if sugars in am are not <150 in 3 days, please increase NPH at bedtime to 25 units.  Return on 07/28/2015 - with log.  - continue checking sugars at different times of the day - check 2 times a day, rotating checks - check HbA1c today >> 14.1%!!! - UTD with eye exam - Return to clinic in 1.5 months with sugar log

## 2015-06-15 NOTE — Patient Instructions (Addendum)
Please increase NPH to: - 15 units in am and 20 units at bedtime  - but if sugars in am are not <150 in 3 days, please increase NPH at bedtime to 25 units.  Return on 07/28/2015 - with log.

## 2015-06-16 LAB — POCT GLYCOSYLATED HEMOGLOBIN (HGB A1C): Hemoglobin A1C: 14.1

## 2015-06-16 NOTE — Addendum Note (Signed)
Addended by: Richardine Service on: 06/16/2015 10:06 AM   Modules accepted: Orders

## 2015-06-17 ENCOUNTER — Encounter (HOSPITAL_BASED_OUTPATIENT_CLINIC_OR_DEPARTMENT_OTHER): Payer: Self-pay | Admitting: *Deleted

## 2015-06-18 ENCOUNTER — Encounter: Payer: Self-pay | Admitting: Internal Medicine

## 2015-06-18 ENCOUNTER — Encounter (HOSPITAL_BASED_OUTPATIENT_CLINIC_OR_DEPARTMENT_OTHER)
Admission: RE | Admit: 2015-06-18 | Discharge: 2015-06-18 | Disposition: A | Discharge status: Home / Self Care | Payer: Commercial Managed Care - HMO | Source: Ambulatory Visit | Attending: Orthopedic Surgery | Admitting: Orthopedic Surgery

## 2015-06-18 DIAGNOSIS — M65342 Trigger finger, left ring finger: Secondary | ICD-10-CM | POA: Diagnosis not present

## 2015-06-18 DIAGNOSIS — Z01818 Encounter for other preprocedural examination: Secondary | ICD-10-CM | POA: Insufficient documentation

## 2015-06-18 LAB — BASIC METABOLIC PANEL
ANION GAP: 6 (ref 5–15)
BUN: 33 mg/dL — AB (ref 6–20)
CALCIUM: 9.1 mg/dL (ref 8.9–10.3)
CHLORIDE: 100 mmol/L — AB (ref 101–111)
CO2: 26 mmol/L (ref 22–32)
Creatinine, Ser: 1.33 mg/dL — ABNORMAL HIGH (ref 0.44–1.00)
GFR, EST AFRICAN AMERICAN: 50 mL/min — AB (ref >60)
GFR, EST NON AFRICAN AMERICAN: 43 mL/min — AB (ref >60)
GLUCOSE: 339 mg/dL — AB (ref 65–99)
POTASSIUM: 4.4 mmol/L (ref 3.5–5.1)
Sodium: 132 mmol/L — ABNORMAL LOW (ref 135–145)

## 2015-06-23 ENCOUNTER — Ambulatory Visit (HOSPITAL_BASED_OUTPATIENT_CLINIC_OR_DEPARTMENT_OTHER)
Admission: RE | Admit: 2015-06-23 | Discharge: 2015-06-23 | Disposition: A | Discharge status: Home / Self Care | Payer: Commercial Managed Care - HMO | Source: Ambulatory Visit | Attending: Orthopedic Surgery | Admitting: Orthopedic Surgery

## 2015-06-23 ENCOUNTER — Encounter (HOSPITAL_BASED_OUTPATIENT_CLINIC_OR_DEPARTMENT_OTHER): Payer: Self-pay | Admitting: Certified Registered"

## 2015-06-23 ENCOUNTER — Encounter (HOSPITAL_BASED_OUTPATIENT_CLINIC_OR_DEPARTMENT_OTHER)
Admission: RE | Disposition: A | Discharge status: Home / Self Care | Payer: Self-pay | Source: Ambulatory Visit | Attending: Orthopedic Surgery

## 2015-06-23 ENCOUNTER — Ambulatory Visit (HOSPITAL_BASED_OUTPATIENT_CLINIC_OR_DEPARTMENT_OTHER): Payer: Commercial Managed Care - HMO | Admitting: Certified Registered"

## 2015-06-23 DIAGNOSIS — E119 Type 2 diabetes mellitus without complications: Secondary | ICD-10-CM | POA: Diagnosis not present

## 2015-06-23 DIAGNOSIS — Z905 Acquired absence of kidney: Secondary | ICD-10-CM | POA: Insufficient documentation

## 2015-06-23 DIAGNOSIS — Z6841 Body Mass Index (BMI) 40.0 and over, adult: Secondary | ICD-10-CM | POA: Diagnosis not present

## 2015-06-23 DIAGNOSIS — Z794 Long term (current) use of insulin: Secondary | ICD-10-CM | POA: Diagnosis not present

## 2015-06-23 DIAGNOSIS — D649 Anemia, unspecified: Secondary | ICD-10-CM | POA: Diagnosis not present

## 2015-06-23 DIAGNOSIS — M65342 Trigger finger, left ring finger: Secondary | ICD-10-CM | POA: Diagnosis not present

## 2015-06-23 DIAGNOSIS — I10 Essential (primary) hypertension: Secondary | ICD-10-CM | POA: Diagnosis not present

## 2015-06-23 DIAGNOSIS — K449 Diaphragmatic hernia without obstruction or gangrene: Secondary | ICD-10-CM | POA: Diagnosis not present

## 2015-06-23 DIAGNOSIS — Z85528 Personal history of other malignant neoplasm of kidney: Secondary | ICD-10-CM | POA: Insufficient documentation

## 2015-06-23 DIAGNOSIS — K589 Irritable bowel syndrome without diarrhea: Secondary | ICD-10-CM | POA: Diagnosis not present

## 2015-06-23 DIAGNOSIS — Z90710 Acquired absence of both cervix and uterus: Secondary | ICD-10-CM | POA: Insufficient documentation

## 2015-06-23 DIAGNOSIS — Z86010 Personal history of colonic polyps: Secondary | ICD-10-CM | POA: Insufficient documentation

## 2015-06-23 DIAGNOSIS — M65842 Other synovitis and tenosynovitis, left hand: Secondary | ICD-10-CM | POA: Insufficient documentation

## 2015-06-23 HISTORY — PX: TRIGGER FINGER RELEASE: SHX641

## 2015-06-23 LAB — GLUCOSE, CAPILLARY
Glucose-Capillary: 252 mg/dL — ABNORMAL HIGH (ref 65–99)
Glucose-Capillary: 229 mg/dL — ABNORMAL HIGH (ref 65–99)

## 2015-06-23 SURGERY — RELEASE, A1 PULLEY, FOR TRIGGER FINGER
Anesthesia: General | Site: Finger | Laterality: Left

## 2015-06-23 MED ORDER — BUPIVACAINE HCL (PF) 0.25 % IJ SOLN
INTRAMUSCULAR | Status: DC | PRN
  Administered 2015-06-23: 5 mL

## 2015-06-23 MED ORDER — HYDROMORPHONE HCL 1 MG/ML IJ SOLN
0.2500 mg | INTRAMUSCULAR | Status: DC | PRN
  Administered 2015-06-23: 0.5 mg via INTRAVENOUS

## 2015-06-23 MED ORDER — CEFAZOLIN SODIUM-DEXTROSE 2-3 GM-% IV SOLR
2.0000 g | INTRAVENOUS | Status: AC
  Administered 2015-06-23: 2 g via INTRAVENOUS

## 2015-06-23 MED ORDER — FENTANYL CITRATE (PF) 100 MCG/2ML IJ SOLN
50.0000 ug | INTRAMUSCULAR | Status: DC | PRN
  Administered 2015-06-23: 50 ug via INTRAVENOUS

## 2015-06-23 MED ORDER — CEFAZOLIN SODIUM-DEXTROSE 2-3 GM-% IV SOLR
INTRAVENOUS | Status: AC
Start: 2015-06-23 — End: 2015-06-23
  Filled 2015-06-23: qty 50

## 2015-06-23 MED ORDER — PROPOFOL 10 MG/ML IV BOLUS
INTRAVENOUS | Status: DC | PRN
  Administered 2015-06-23: 200 mg via INTRAVENOUS

## 2015-06-23 MED ORDER — ONDANSETRON HCL 4 MG/2ML IJ SOLN
INTRAMUSCULAR | Status: DC | PRN
  Administered 2015-06-23: 4 mg via INTRAVENOUS

## 2015-06-23 MED ORDER — HYDROMORPHONE HCL 1 MG/ML IJ SOLN
INTRAMUSCULAR | Status: AC
  Filled 2015-06-23: qty 1

## 2015-06-23 MED ORDER — HYDROCODONE-ACETAMINOPHEN 5-325 MG PO TABS: 1.0000 | ORAL_TABLET | Freq: Four times a day (QID) | ORAL | Status: DC | PRN

## 2015-06-23 MED ORDER — FENTANYL CITRATE (PF) 100 MCG/2ML IJ SOLN
INTRAMUSCULAR | Status: AC
  Filled 2015-06-23: qty 2

## 2015-06-23 MED ORDER — HYDROCODONE-ACETAMINOPHEN 5-325 MG PO TABS
ORAL_TABLET | ORAL | Status: AC
  Filled 2015-06-23: qty 1

## 2015-06-23 MED ORDER — HYDROCODONE-ACETAMINOPHEN 5-325 MG PO TABS
1.0000 | ORAL_TABLET | Freq: Once | ORAL | Status: AC
  Administered 2015-06-23: 1 via ORAL

## 2015-06-23 MED ORDER — LACTATED RINGERS IV SOLN
INTRAVENOUS | Status: DC
  Administered 2015-06-23: 10:00:00 via INTRAVENOUS

## 2015-06-23 MED ORDER — MIDAZOLAM HCL 2 MG/2ML IJ SOLN
INTRAMUSCULAR | Status: AC
  Filled 2015-06-23: qty 2

## 2015-06-23 MED ORDER — GLYCOPYRROLATE 0.2 MG/ML IJ SOLN: 0.2000 mg | Freq: Once | INTRAMUSCULAR | Status: DC | PRN

## 2015-06-23 MED ORDER — CHLORHEXIDINE GLUCONATE 4 % EX LIQD: 60.0000 mL | Freq: Once | CUTANEOUS | Status: DC

## 2015-06-23 MED ORDER — CEFAZOLIN SODIUM-DEXTROSE 2-3 GM-% IV SOLR: 2.0000 g | INTRAVENOUS | Status: DC

## 2015-06-23 MED ORDER — LIDOCAINE HCL (CARDIAC) 20 MG/ML IV SOLN
INTRAVENOUS | Status: DC | PRN
  Administered 2015-06-23: 60 mg via INTRAVENOUS

## 2015-06-23 MED ORDER — DEXAMETHASONE SODIUM PHOSPHATE 10 MG/ML IJ SOLN
INTRAMUSCULAR | Status: DC | PRN
  Administered 2015-06-23: 4 mg via INTRAVENOUS

## 2015-06-23 MED ORDER — SCOPOLAMINE 1 MG/3DAYS TD PT72: 1.0000 | MEDICATED_PATCH | Freq: Once | TRANSDERMAL | Status: DC | PRN

## 2015-06-23 MED ORDER — MIDAZOLAM HCL 2 MG/2ML IJ SOLN
1.0000 mg | INTRAMUSCULAR | Status: DC | PRN
  Administered 2015-06-23: 2 mg via INTRAVENOUS

## 2015-06-23 SURGICAL SUPPLY — 34 items
BANDAGE COBAN STERILE 2 (GAUZE/BANDAGES/DRESSINGS) ×2 IMPLANT
BLADE SURG 15 STRL LF DISP TIS (BLADE) ×1 IMPLANT
BLADE SURG 15 STRL SS (BLADE) ×2
BNDG CMPR 9X4 STRL LF SNTH (GAUZE/BANDAGES/DRESSINGS) ×1
BNDG ESMARK 4X9 LF (GAUZE/BANDAGES/DRESSINGS) ×2 IMPLANT
CHLORAPREP W/TINT 26ML (MISCELLANEOUS) ×2 IMPLANT
CORDS BIPOLAR (ELECTRODE) IMPLANT
COVER BACK TABLE 60X90IN (DRAPES) ×2 IMPLANT
COVER MAYO STAND STRL (DRAPES) ×2 IMPLANT
CUFF TOURNIQUET SINGLE 18IN (TOURNIQUET CUFF) ×2 IMPLANT
DECANTER SPIKE VIAL GLASS SM (MISCELLANEOUS) IMPLANT
DRAPE EXTREMITY T 121X128X90 (DRAPE) ×2 IMPLANT
DRAPE SURG 17X23 STRL (DRAPES) ×2 IMPLANT
GAUZE SPONGE 4X4 12PLY STRL (GAUZE/BANDAGES/DRESSINGS) ×2 IMPLANT
GAUZE XEROFORM 1X8 LF (GAUZE/BANDAGES/DRESSINGS) ×2 IMPLANT
GLOVE BIOGEL PI IND STRL 7.0 (GLOVE) ×1 IMPLANT
GLOVE BIOGEL PI IND STRL 8.5 (GLOVE) ×1 IMPLANT
GLOVE BIOGEL PI INDICATOR 7.0 (GLOVE) ×1
GLOVE BIOGEL PI INDICATOR 8.5 (GLOVE) ×1
GLOVE ECLIPSE 6.5 STRL STRAW (GLOVE) ×2 IMPLANT
GLOVE SURG ORTHO 8.0 STRL STRW (GLOVE) ×2 IMPLANT
GOWN STRL REUS W/ TWL LRG LVL3 (GOWN DISPOSABLE) ×1 IMPLANT
GOWN STRL REUS W/TWL LRG LVL3 (GOWN DISPOSABLE) ×2
GOWN STRL REUS W/TWL XL LVL3 (GOWN DISPOSABLE) ×2 IMPLANT
NDL PRECISIONGLIDE 27X1.5 (NEEDLE) ×1 IMPLANT
NEEDLE PRECISIONGLIDE 27X1.5 (NEEDLE) ×2 IMPLANT
NS IRRIG 1000ML POUR BTL (IV SOLUTION) ×2 IMPLANT
PACK BASIN DAY SURGERY FS (CUSTOM PROCEDURE TRAY) ×2 IMPLANT
STOCKINETTE 4X48 STRL (DRAPES) ×2 IMPLANT
SUT ETHILON 4 0 PS 2 18 (SUTURE) ×2 IMPLANT
SYR BULB 3OZ (MISCELLANEOUS) ×2 IMPLANT
SYR CONTROL 10ML LL (SYRINGE) ×2 IMPLANT
TOWEL OR 17X24 6PK STRL BLUE (TOWEL DISPOSABLE) ×2 IMPLANT
UNDERPAD 30X30 (UNDERPADS AND DIAPERS) ×2 IMPLANT

## 2015-06-23 NOTE — Anesthesia Preprocedure Evaluation (Addendum)
Anesthesia Evaluation  Patient identified by MRN, date of birth, ID band Patient awake    Reviewed: Allergy & Precautions, H&P , NPO status , Patient's Chart, lab work & pertinent test results  Airway Mallampati: II  TM Distance: >3 FB Neck ROM: Full    Dental no notable dental hx. (+) Teeth Intact, Dental Advisory Given   Pulmonary neg pulmonary ROS,  breath sounds clear to auscultation  Pulmonary exam normal       Cardiovascular hypertension, Pt. on medications Rhythm:Regular Rate:Normal     Neuro/Psych negative neurological ROS  negative psych ROS   GI/Hepatic Neg liver ROS, hiatal hernia,   Endo/Other  diabetes, Type 2, Insulin DependentMorbid obesity  Renal/GU negative Renal ROSS/p nephrectomy -renal cell carcinoma   negative genitourinary   Musculoskeletal   Abdominal   Peds  Hematology negative hematology ROS (+) anemia ,   Anesthesia Other Findings   Reproductive/Obstetrics negative OB ROS                             Anesthesia Physical Anesthesia Plan  ASA: III  Anesthesia Plan: General   Post-op Pain Management:    Induction: Intravenous  Airway Management Planned: LMA  Additional Equipment:   Intra-op Plan:   Post-operative Plan: Extubation in OR  Informed Consent: I have reviewed the patients History and Physical, chart, labs and discussed the procedure including the risks, benefits and alternatives for the proposed anesthesia with the patient or authorized representative who has indicated his/her understanding and acceptance.   Dental advisory given  Plan Discussed with: CRNA  Anesthesia Plan Comments:         Anesthesia Quick Evaluation

## 2015-06-23 NOTE — Transfer of Care (Signed)
Immediate Anesthesia Transfer of Care Note  Patient: [name]  Procedure(s) Performed: Procedure(s): RELEASE TRIGGER FINGER/A-1 PULLEY LEFT RING FINGER (Left)  Patient Location: PACU  Anesthesia Type:General  Level of Consciousness: awake, sedated and patient cooperative  Airway & Oxygen Therapy: Patient Spontanous Breathing and Patient connected to face mask oxygen  Post-op Assessment: Report given to RN  Post vital signs: Reviewed and stable  Last Vitals:  Filed Vitals:   06/23/15 1124  BP:   Pulse: 88  Temp:   Resp: 21    Complications: No apparent anesthesia complications

## 2015-06-23 NOTE — Anesthesia Procedure Notes (Signed)
Procedure Name: LMA Insertion Date/Time: 06/23/2015 11:01 AM Performed by: Nazire Fruth D Pre-anesthesia Checklist: Patient identified, Emergency Drugs available, Suction available and Patient being monitored Patient Re-evaluated:Patient Re-evaluated prior to inductionOxygen Delivery Method: Circle System Utilized Preoxygenation: Pre-oxygenation with 100% oxygen Intubation Type: IV induction Ventilation: Mask ventilation without difficulty LMA: LMA inserted LMA Size: 4.0 Number of attempts: 1 Airway Equipment and Method: Bite block Placement Confirmation: positive ETCO2 Tube secured with: Tape Dental Injury: Teeth and Oropharynx as per pre-operative assessment

## 2015-06-23 NOTE — Anesthesia Postprocedure Evaluation (Signed)
  Anesthesia Post-op Note  Patient: [name]  Procedure(s) Performed: Procedure(s): RELEASE TRIGGER FINGER/A-1 PULLEY LEFT RING FINGER (Left)  Patient Location: PACU  Anesthesia Type:General  Level of Consciousness: awake and alert   Airway and Oxygen Therapy: Patient Spontanous Breathing  Post-op Pain: Controlled  Post-op Assessment: Post-op Vital signs reviewed, Patient's Cardiovascular Status Stable and Respiratory Function Stable  Post-op Vital Signs: Reviewed  Filed Vitals:   06/23/15 1200  BP: 153/83  Pulse: 74  Temp:   Resp: 15    Complications: No apparent anesthesia complications

## 2015-06-23 NOTE — Op Note (Signed)
Dictation Number [id number]

## 2015-06-23 NOTE — Brief Op Note (Signed)
06/23/2015  11:20 AM  PATIENT:  Trisha Mangle  59 y.o. female  PRE-OPERATIVE DIAGNOSIS:  left ring finger trigger  POST-OPERATIVE DIAGNOSIS:  left ring finger trigger  PROCEDURE:  Procedure(s): RELEASE TRIGGER FINGER/A-1 PULLEY LEFT RING FINGER (Left)  SURGEON:  Surgeon(s) and Role:    * Daryll Brod, MD - Primary  PHYSICIAN ASSISTANT:   ASSISTANTS: none   ANESTHESIA:   local and general  EBL:  Total I/O In: 500 [I.V.:500] Out: -   BLOOD ADMINISTERED:none  DRAINS: none   LOCAL MEDICATIONS USED:  BUPIVICAINE   SPECIMEN:  No Specimen  DISPOSITION OF SPECIMEN:  N/A  COUNTS:  YES  TOURNIQUET:   Total Tourniquet Time Documented: Forearm (Right) - 6 minutes Total: Forearm (Right) - 6 minutes   DICTATION: .Other Dictation: Dictation Number [id number]  PLAN OF CARE: Discharge to home after PACU  PATIENT DISPOSITION:  PACU - hemodynamically stable.

## 2015-06-23 NOTE — Discharge Instructions (Addendum)

## 2015-06-23 NOTE — H&P (Signed)
[name] is a 59 year-old right-hand dominant female who comes in complaining of catching of her left palm, ring finger with pain.  This has been going on for approximately one month.  She has seen Dr. Daylene Katayama in the past who has released trigger fingers. She is has insulin dependent diabetes mellitus.  She has not had injection. She complains of intermittent, moderate to sharp, severe, stabbing pain.  She states it is getting worse.  She is not complaining of any numbness or tingling.    ALLERGIES:  None.  MEDICATIONS:  Insulin.     SURGICAL/MEDICAL HISTORY:   Trigger finger releases, she also has CMC arthritis.  FAMILY MEDICAL HISTORY:    Positive for high blood pressure and arthritis.  SOCIAL HISTORY:  She does not smoke or drink.  She is divorced and retired.    REVIEW OF SYSTEMS:    Positive for cancer, glasses, blood in her stool otherwise negative 14 points.  [name] is an 60 y.o. female.   Chief Complaint: catching left ring finger HPI: see above  Past Medical History  Diagnosis Date  . Diverticulosis of colon with hemorrhage 2010, 09/2011  . Hypertension   . Personal history of colonic polyps     adenomas since 2010  . Diabetes mellitus   . Blood transfusion   . IBS (irritable bowel syndrome)   . Hx: UTI (urinary tract infection)   . Anemia   . Helicobacter pylori gastritis 09/13/2013  . Blood transfusion without reported diagnosis   . H/O hiatal hernia   . Renal cell carcinoma 2004    lt kidney removed    Past Surgical History  Procedure Laterality Date  . Nephrectomy      left  . Tubal ligation    . Knee arthroscopy      x2 left  . Hand surgery      x5 right  . Abdominal hysterectomy    . Cholecystectomy    . Upper gastrointestinal endoscopy  12/12/2008    gastroporesis  . Flexible sigmoidoscopy  12/12/2008    diverticulosis  . Colonoscopy  12/19/2008    diverticulosis  . Colonoscopy  11/17/2011    Procedure: COLONOSCOPY;  Surgeon: Zenovia Jarred, MD;  Location: [facility];  Service: Gastroenterology;  Laterality: N/A;  . Hydradenitis excision Right 03/21/2014    Procedure: EXCISION HIDRADENITIS AXILLA;  Surgeon: Pedro Earls, MD;  Location: [facility];  Service: General;  Laterality: Right;  . Dilation and curettage of uterus N/A 07/11/2014    Procedure: Repair of Vaginal Cuff;  Surgeon: Melina Schools, MD;  Location: [facility];  Service: Gynecology;  Laterality: N/A;  . Abdominal hysterectomy      Family History  Problem Relation Age of Onset  . Colon polyps Mother   . Diabetes Maternal Grandmother   . Cancer Maternal Grandmother     breast  . Diabetes Father   . Colon cancer Neg Hx   . Cancer Daughter     breast   Social History:  reports that she has never smoked. She has never used smokeless tobacco. She reports that she does not drink alcohol or use illicit drugs.  Allergies: No Known Allergies  No prescriptions prior to admission    No results found for this or any previous visit (from the past 48 hour(s)).  No results found.   Pertinent items are noted in HPI.  Height 5\' 2"  (1.575 m), weight 103.874 kg (229 lb), last menstrual period 11/16/2002.  General appearance: alert, cooperative and appears stated age Head: Normocephalic, without obvious abnormality Neck: no JVD Resp: clear to auscultation bilaterally Cardio: regular rate and rhythm, S1, S2 normal, no murmur, click, rub or gallop GI: soft nontender Extremities: extremities normal, atraumatic, no cyanosis or edema and trigger leftcvring Pulses: 2+ and symmetric Skin: Skin color, texture, turgor normal. No rashes or lesions Neurologic: Grossly normal Incision/Wound: na  Assessment/Plan DIAGNOSIS:   Stenosing tenosynovitis left ring finger.   RECOMMENDATIONS/PLAN:   She is desirous of having this surgically released stating that she has had these before.  She does not desire an injection.  She is scheduled for release A-1  pulley left ring finger.  The pre, peri and postoperative course were discussed along with the risks and complications.  The patient is aware there is no guarantee with the surgery, possibility of infection, recurrence, injury to arteries, nerves, tendons, incomplete relief of symptoms and dystrophy.   She is scheduled for release left ring finger A-1 pulley as an outpatient under regional anesthesia.  Keyri Salberg R 06/23/2015, 9:16 AM

## 2015-06-24 ENCOUNTER — Encounter (HOSPITAL_BASED_OUTPATIENT_CLINIC_OR_DEPARTMENT_OTHER): Payer: Self-pay | Admitting: Orthopedic Surgery

## 2015-06-24 ENCOUNTER — Ambulatory Visit: Payer: Self-pay | Admitting: Skilled Nursing Facility1

## 2015-06-24 NOTE — Op Note (Signed)
NAME:  [name], [name]          ACCOUNT NO.:  [id number]  MEDICAL RECORD NO.:  [id number]  LOCATION:                                 FACILITY:  PHYSICIAN:  Daryll Brod, M.D.       DATE OF BIRTH:  [birth year]  DATE OF PROCEDURE:  06/23/2015 DATE OF DISCHARGE:                              OPERATIVE REPORT   PREOPERATIVE DIAGNOSIS:  Stenosing tenosynovitis, left ring finger.  POSTOPERATIVE DIAGNOSIS:  Stenosing tenosynovitis, left ring finger.  OPERATION:  Release of A1 pulley, left ring finger.  SURGEON:  Daryll Brod, M.D.  ANESTHESIA:  General with local infiltration.  ANESTHESIOLOGIST:  Soledad Gerlach, M.D.  HISTORY:  The patient is a 59 year old female with a history of triggering of her left ring finger.  She is diabetic.  She has elected not to undergo injections, is admitted now for release of the A1 pulley. Pre, peri, and postoperative course have been discussed along with risks and complications.  She is aware that there is no guarantee with the surgery; possibility of infection; recurrence of injury to arteries, nerves, tendons; incomplete relief of symptoms; dystrophy.  In the preoperative area, the patient was seen, the extremity marked by both the patient and surgeon.  Antibiotic given.  DESCRIPTION OF PROCEDURE:  The patient was brought to the operating room where general anesthetic was carried out without difficulty.  She was prepped using ChloraPrep in supine position with left arm free.  A 3- minute dry time was allowed.  Time-out taken, confirming the patient and procedure.  Oblique incision was made over the A1 pulley after exsanguination of the limb with an Esmarch bandage.  Inflation of the tourniquet on the forearm at 250 mmHg.  Neurovascular structures were identified, protected with retractors.  The dissection carried down to the A1 pulley.  This was released on its radial aspect.  A small incision made centrally in A2.  A partial tenosynovectomy  performed proximally, separating the 2 tendons to be certain there was no adherence.  Finger placed through a full range of motion, no further triggering was noted.  The wound was irrigated with saline and closed with interrupted 4-0 nylon sutures.  A local infiltration of 0.25% bupivacaine without epinephrine was given, approximately 5 mL was used.  Sterile compressive dressing with the fingers free was applied.  On deflation of the tourniquet, all fingers immediately pinked.  She was taken to the recovery room for observation in satisfactory condition.  She will be discharged home to return to the [facility] in 1 week on Norco.          ______________________________ Daryll Brod, M.D.     GK/MEDQ  D:  06/23/2015  T:  06/24/2015  Job:  [id number]

## 2015-07-01 ENCOUNTER — Other Ambulatory Visit: Payer: Self-pay | Admitting: Internal Medicine

## 2015-07-01 MED ORDER — ONETOUCH ULTRA MINI W/DEVICE KIT: PACK | Status: DC

## 2015-07-01 MED ORDER — GLUCOSE BLOOD VI STRP: ORAL_STRIP | Status: DC

## 2015-07-01 MED ORDER — ONETOUCH DELICA LANCETS FINE MISC: Status: DC

## 2015-07-28 ENCOUNTER — Ambulatory Visit (INDEPENDENT_AMBULATORY_CARE_PROVIDER_SITE_OTHER): Payer: Commercial Managed Care - HMO | Admitting: Internal Medicine

## 2015-07-28 ENCOUNTER — Encounter: Payer: Self-pay | Admitting: Internal Medicine

## 2015-07-28 VITALS — BP 128/80 | HR 83 | Temp 98.6°F | Resp 12 | Wt 230.0 lb

## 2015-07-28 DIAGNOSIS — E1165 Type 2 diabetes mellitus with hyperglycemia: Secondary | ICD-10-CM

## 2015-07-28 DIAGNOSIS — E1122 Type 2 diabetes mellitus with diabetic chronic kidney disease: Secondary | ICD-10-CM | POA: Insufficient documentation

## 2015-07-28 DIAGNOSIS — E11319 Type 2 diabetes mellitus with unspecified diabetic retinopathy without macular edema: Secondary | ICD-10-CM | POA: Diagnosis not present

## 2015-07-28 DIAGNOSIS — N183 Chronic kidney disease, stage 3 unspecified: Secondary | ICD-10-CM | POA: Insufficient documentation

## 2015-07-28 DIAGNOSIS — IMO0002 Reserved for concepts with insufficient information to code with codable children: Secondary | ICD-10-CM

## 2015-07-28 MED ORDER — GLIPIZIDE 5 MG PO TABS: 5.0000 mg | ORAL_TABLET | Freq: Two times a day (BID) | ORAL | Status: DC

## 2015-07-28 NOTE — Progress Notes (Signed)
Patient ID: [name], female   DOB: 12/28/1955, 59 y.o.   MRN: [id number]  HPI: [name] is a 59 y.o.-year-old female, returning for management of DM2, dx 1995, insulin-dependent, uncontrolled, with complications (CKD, retinopathy). Last visit 1.5 mo ago.  Patient is noncompliant with her insulin regimen and visits. She schedules urgent appointments for sugars in the 400s-500s as she is out of insulin for few weeks. I had a long discussion with her at last visit about the importance of getting in control of her diabetes. This was a scheduled appointment, to which she presents with her sugar log.  She had surgery (trigger finger) on 06/23/2015.  Last hemoglobin A1c was: Lab Results  Component Value Date   HGBA1C 14.1 06/16/2015   HGBA1C 12.5* 08/29/2014   HGBA1C 7.8* 01/06/2014   She is on: - NPH 20 units in am and 20 units at bedtime (vials) She stopped Metformin 1000 mg bid We stopped Cycloset 2.4 mg >> of nausea/vomiting.  She was on Humalog 6 units with largest meal of the day >> stopped We had to stop Metformin as she has a high creatinine and we stopped Victoza as she developed pancreatitis: AP and nausea and was on liquids for 2 weeks.  She has been on JanuMet, Metformin, Glyburide. She was on and off meds due to insurance - started to have insurance again on 02/2013.  All her meds are free now.  Pt checks her sugars 2x a day and they are better (per her log): - am: 74-109 >> mostly up to 140 >> 129-135 >> 150-200, then 200-300s, 400s >> 186-242 - after b'fast: 120 and 218 >> n/c >> 222-289 - lunch: 120s >> 278-336 >> 80-130 >> n/c >> 90-100 >> 135 >> n/c >> 190-232 - 2h after lunch: 191 >> n/c >> 189-200 >> n/c - before dinner: 120-130 >> not checking >> 115-134 >> 120 >> low 100s >> low 100s >> n/c >> 183 - 1h after dinner: 180 >> not checking >> n/c  - bedtime: 105-152 >> n/c >> 189-210 >> 189-210 No lows; she has hypoglycemia awareness at 90-100.   Pt's  meals are  -reduced portions, more salads: - Breakfast: coffee (raw sugar) + egg + toast - Lunch: blackened salmon (baked) + string beans (no desert) - Dinner: baked chicken - free range/turkey - free range/ribeye + vegetables  - Snacks: graham crackers with PB, apples, cherry No juice or sodas.   She has a [employer] glucometer .  - Has CKD, last BUN/creatinine:  Lab Results  Component Value Date   BUN 33* 06/18/2015   CREATININE 1.33* 06/18/2015  She did not tolerate ACE inhibitors in the past. - last set of lipids: Lab Results  Component Value Date   CHOL 212* 08/29/2014   HDL 31.10* 08/29/2014   LDLCALC 143* 08/29/2014   TRIG 192.0* 08/29/2014   CHOLHDL 7 08/29/2014  She is not on a statin. - last eye exam was in 06/04/2015 (Dr Baird Cancer) >> + DR >> sent to a retina specialist >> will see him again in 6 mo. - no numbness and tingling in her feet.  I reviewed her chart and she also has a history of poorly controlled hypertension, history of renal cell carcinoma-status post nephrectomy, morbid obesity, IBS.  ROS: Constitutional: no weight gain/loss, no fatigue, + subjective hyperthermia/no hypothermia, no nocturia Eyes: no blurry vision, no xerophthalmia ENT: no sore throat, no nodules palpated in throat, no dysphagia/odynophagia, no hoarseness Cardiovascular: no CP/SOB/palpitations/leg swelling Respiratory:+ cough/no  SOB Gastrointestinal: + N/+ V/+ D/no C Musculoskeletal: no muscle/+ joint aches Skin: no rashes, + itching Neuro: + HA  I reviewed pt's medications, allergies, PMH, social hx, family hx, and changes were documented in the history of present illness. Otherwise, unchanged from my initial visit note.  PE: BP 128/80 mmHg  Pulse 83  Temp(Src) 98.6 F (37 C) (Oral)  Resp 12  Wt 230 lb (104.327 kg)  SpO2 96%  LMP 11/16/2002 Wt Readings from Last 3 Encounters:  07/28/15 230 lb (104.327 kg)  06/23/15 228 lb (103.42 kg)  06/15/15 229 lb (103.874 kg)    Constitutional: obese, in NAD Eyes: PERRLA, EOMI, no exophthalmos ENT: moist mucous membranes, no thyromegaly, no cervical lymphadenopathy Cardiovascular: RRR, No MRG Respiratory: CTA B Gastrointestinal: abdomen soft, NT, ND, BS+ Musculoskeletal: no deformities, strength intact in all 4 Skin: moist, warm, acanthosis nigricans on neck  ASSESSMENT: 1. DM2, insulin-dependent, uncontrolled, with complications - diabetic nephropathy - DR  H/o pancreatitis episode (Stopped Victoza)  2. CKD - sees Dr Moshe Cipro - h/o Kidney cancer >> L kidney removed >> Dr Risa Grill   PLAN:  1. Patient with long-standing, uncontrolled diabetes, with h/o med noncompliance, doing better with insulin adm since last visit. She also finally brings a sugar log! Sugars are better, but still high >> will try to add Glipizide. She has pbs affording the NPH insulin (can only get 1 vial at a time, although she needs 2 a mo (1 vial = 24$). She will most likely need short/rapid acting insulin but I do not believe she can afford this for now, unfortunately...  Cr a little worse per last check. If improves in the future, may add half-max dose Metformin ER. - I advised her to: Patient Instructions  Please continue: - NPH 20 units 2x a day  Start: - Glipizide 5 mg before b'fast and dinner  Please return in 1.5 months with your sugar log.   KEEP UP THE GOOD JOB.  - continue checking sugars at different times of the day - check 2 times a day, rotating checks - reviewed prev HbA1c: 14.1%!!! - UTD with eye exam - Return to clinic in 1.5 months with sugar log

## 2015-07-28 NOTE — Patient Instructions (Signed)
Please continue: - NPH 20 units 2x a day  Start: - Glipizide 5 mg before b'fast and dinner  Please return in 1.5 months with your sugar log.   KEEP UP THE GOOD JOB.

## 2015-08-06 LAB — MICROALBUMIN, URINE

## 2015-09-10 ENCOUNTER — Telehealth: Payer: Self-pay | Admitting: Family Medicine

## 2015-09-10 DIAGNOSIS — R109 Unspecified abdominal pain: Secondary | ICD-10-CM

## 2015-09-10 NOTE — Telephone Encounter (Signed)
[facility] for referral to Dr Carlean Purl- dx abd pain unspecified

## 2015-09-10 NOTE — Telephone Encounter (Signed)
[facility] for referral? Pt states that she is having pancreas problems again as well as epigastric pain. States Dr. Carlean Purl did her endoscopy and would like to return to him for these other issues.

## 2015-09-10 NOTE — Telephone Encounter (Signed)
Referral placed.

## 2015-09-10 NOTE — Telephone Encounter (Signed)
Relation to QJ:EADG Call back number:[phone]   Reason for call:  Patient requesting a referral to dr. Carlean Purl

## 2015-09-15 ENCOUNTER — Ambulatory Visit: Payer: Medicare Other | Admitting: Internal Medicine

## 2015-10-06 ENCOUNTER — Ambulatory Visit: Payer: Commercial Managed Care - HMO | Admitting: Internal Medicine

## 2015-10-06 ENCOUNTER — Encounter: Payer: Self-pay | Admitting: Physician Assistant

## 2015-10-06 ENCOUNTER — Ambulatory Visit (INDEPENDENT_AMBULATORY_CARE_PROVIDER_SITE_OTHER): Payer: Commercial Managed Care - HMO | Admitting: Physician Assistant

## 2015-10-06 ENCOUNTER — Other Ambulatory Visit (INDEPENDENT_AMBULATORY_CARE_PROVIDER_SITE_OTHER): Payer: Commercial Managed Care - HMO

## 2015-10-06 VITALS — BP 160/90 | HR 88 | Ht 61.0 in | Wt 234.0 lb

## 2015-10-06 DIAGNOSIS — R1013 Epigastric pain: Secondary | ICD-10-CM

## 2015-10-06 DIAGNOSIS — G8929 Other chronic pain: Secondary | ICD-10-CM

## 2015-10-06 DIAGNOSIS — R110 Nausea: Secondary | ICD-10-CM

## 2015-10-06 DIAGNOSIS — Z0289 Encounter for other administrative examinations: Secondary | ICD-10-CM

## 2015-10-06 LAB — BASIC METABOLIC PANEL
BUN: 23 mg/dL (ref 6–23)
CO2: 30 meq/L (ref 19–32)
Calcium: 9.7 mg/dL (ref 8.4–10.5)
Chloride: 98 mEq/L (ref 96–112)
Creatinine, Ser: 1.05 mg/dL (ref 0.40–1.20)
GFR: 69.24 mL/min (ref >60.00)
Glucose, Bld: 245 mg/dL — ABNORMAL HIGH (ref 70–99)
POTASSIUM: 4.1 meq/L (ref 3.5–5.1)
SODIUM: 134 meq/L — AB (ref 135–145)

## 2015-10-06 LAB — CBC WITH DIFFERENTIAL/PLATELET
BASOS ABS: 0.1 10*3/uL (ref 0.0–0.1)
Basophils Relative: 0.7 % (ref 0.0–3.0)
Eosinophils Absolute: 0.2 10*3/uL (ref 0.0–0.7)
Eosinophils Relative: 2 % (ref 0.0–5.0)
HCT: 43.5 % (ref 36.0–46.0)
Hemoglobin: 14.1 g/dL (ref 12.0–15.0)
LYMPHS ABS: 3.1 10*3/uL (ref 0.7–4.0)
Lymphocytes Relative: 40.4 % (ref 12.0–46.0)
MCHC: 32.3 g/dL (ref 30.0–36.0)
MCV: 85.8 fl (ref 78.0–100.0)
MONO ABS: 0.5 10*3/uL (ref 0.1–1.0)
MONOS PCT: 6.5 % (ref 3.0–12.0)
NEUTROS PCT: 50.4 % (ref 43.0–77.0)
Neutro Abs: 3.9 10*3/uL (ref 1.4–7.7)
Platelets: 214 10*3/uL (ref 150.0–400.0)
RBC: 5.07 Mil/uL (ref 3.87–5.11)
RDW: 14.4 % (ref 11.5–15.5)
WBC: 7.8 10*3/uL (ref 4.0–10.5)

## 2015-10-06 LAB — LIPASE: Lipase: 65 U/L — ABNORMAL HIGH (ref 11.0–59.0)

## 2015-10-06 MED ORDER — ONDANSETRON HCL 4 MG PO TABS: ORAL_TABLET | ORAL | Status: DC

## 2015-10-06 MED ORDER — PANTOPRAZOLE SODIUM 40 MG PO TBEC: DELAYED_RELEASE_TABLET | ORAL | Status: DC

## 2015-10-06 NOTE — Patient Instructions (Signed)
Please go to the basement level to have your labs drawn and stool study.  We sent refills to [facility], [employer], [employer]. 1. Protonix ( Pantoprazole Sodium ) 40 mg. 2. Zofran ( Ondansetron 4 mg ).

## 2015-10-06 NOTE — Progress Notes (Signed)
Patient ID: Shenetta Schnackenberg, female   DOB: 12-30-1955, 59 y.o.   MRN: [id number]   Subjective:    Patient ID: Trisha Mangle, female    DOB: Jun 09, 1956, 59 y.o.   MRN: [id number]  HPI Kaelan is a pleasant 59 year old African-American female known to Dr. Carlean Purl. She has history of adult-onset diabetes mellitus, diabetic retinopathy, morbid obesity, hypertension, history of IBS, history of renal cell CA status post left nephrectomy, prior cholecystectomy and diverticulosis. She had EGD in October 2014 showing gastritis which was H. pylori positive and she was treated with Pylera. Colonoscopy in April 2014 multiple small polyps removed all were inflammatory polyps also noted to have left-sided diverticulosis. Patient comes in today complaining of 2 month history of nausea and upper abdominal discomfort. She says her symptoms haven't gotten any worse but she's tired of it. She feels nauseated most days and has a gnawing feeling in her abdomen even when she has eaten. Occasional episodes of vomiting though not on a regular basis. She is also had some sporadic diarrhea but most days bowel movements normal. She says she feels just like she felt when she had the gastritis in 2014. She denies any regular aspirin or NSAID use. She has not been on a PPI. She has Zofran at home and says that does help with the nausea.  Review of Systems Pertinent positive and negative review of systems were noted in the above HPI section.  All other review of systems was otherwise negative.  Outpatient Encounter Prescriptions as of 10/06/2015  Medication Sig  . Blood Glucose Monitoring Suppl (ONE TOUCH ULTRA MINI) W/DEVICE KIT Use to test blood sugar daily. Dx: E11.65  . glucose blood (ONE TOUCH ULTRA TEST) test strip Use to test blood sugar 2 times daily as instructed. Dx: E11.65  . hydrochlorothiazide (MICROZIDE) 12.5 MG capsule TAKE 1 CAPSULE (12.5 MG TOTAL) BY MOUTH DAILY.  Marland Kitchen HYDROcodone-acetaminophen (NORCO) 5-325  MG per tablet Take 1 tablet by mouth every 6 (six) hours as needed for moderate pain.  Marland Kitchen insulin NPH Human (HUMULIN N,NOVOLIN N) 100 UNIT/ML injection Inject into the skin 15 units in the morning and 25 units at bedtime.  Glory Rosebush DELICA LANCETS FINE MISC Use to test blood sugar 2 times daily. Dx: E11.65  . RELION INSULIN SYR 0.5ML/31G 31G X 5/16" 0.5 ML MISC USE ONE SYRINGE TO INJECT INSULIN TWICE DAILY AS INSTRUCTED  . ondansetron (ZOFRAN) 4 MG tablet Take 1 tab every 6 hours as needed for nausea.  . pantoprazole (PROTONIX) 40 MG tablet Take 1 tab every morning.  . [DISCONTINUED] atorvastatin (LIPITOR) 20 MG tablet Take 1 tablet (20 mg total) by mouth daily.  . [DISCONTINUED] glipiZIDE (GLUCOTROL) 5 MG tablet Take 1 tablet (5 mg total) by mouth 2 (two) times daily before a meal.   No facility-administered encounter medications on file as of 10/06/2015.   No Known Allergies Patient Active Problem List   Diagnosis Date Noted  . Uncontrolled type 2 diabetes with retinopathy ([place]) 07/28/2015  . Maxillary sinusitis, acute 01/21/2015  . Strain of left pectoralis muscle 09/26/2014  . Benign paroxysmal positional vertigo 10/03/2013  . Nausea with vomiting 10/03/2013  . Bleeding nose 10/03/2013  . Helicobacter pylori gastritis 09/13/2013  . Pyuria 08/20/2013  . Abnormal abdominal CT scan 08/20/2013  . Abdominal pain 08/16/2013  . Back pain 07/31/2013  . LUQ pain 07/31/2013  . Laceration of hand 07/10/2013  . Hidradenitis suppurativa of right axilla 05/31/2013  . Renal cell carcinoma   . IBS (  irritable bowel syndrome)   . S/p nephrectomy   . 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 OBESITY, MORBID 09/16/2009  . Essential hypertension 06/23/2009  . Personal history of colonoc adenomas 12/12/2008   Social History   Social History  . Marital Status: Divorced    Spouse Name: N/A  . Number of Children: 3  . Years of Education: N/A    Occupational History  . Occupational psychologist    Social History Main Topics  . Smoking status: Never Smoker   . Smokeless tobacco: Never Used  . Alcohol Use: No  . Drug Use: No  . Sexual Activity: No   Other Topics Concern  . Not on file   Social History Narrative   Regular exercise: walk 2 days, aerobics 3 days[facility]   Caffeine use: coffee every AM    Ms. Ladd's family history includes Breast cancer in her daughter and maternal grandmother; Colon polyps in her mother; Diabetes in her father and maternal grandmother. There is no history of Colon cancer.      Objective:    Filed Vitals:   10/06/15 1036  BP: 160/90  Pulse: 88    Physical Exam  well-developed African American female in no acute distress, pleasant blood pressure 160/90 pulse 88 height 5 foot 1 weight 234. HEENT; nontraumatic normocephalic EOMI PERRLA sclera anicteric, Cardiovascular; regular rate and rhythm with S1-S2 no murmur or gallop, Pulmonary ;clear bilaterally, Abdomen; obese soft tender in the epigastrium and left upper quadrant there is no guarding or rebound no palpable mass or hepatosplenomegaly bowel sounds are present, Rectal; exam not done, Extremities; no clubbing cyanosis or edema skin warm dry, Neuropsych ;mood and affect appropriate       Assessment & Plan:   #1 59 yo female with 2 month hx of epigastric discomfort and nausea- hx of Hpylori gastritis and pt says sxs very similar. R/O gastritis, r/o Hpylori #2AODM #3 s/p GB #4 hx IBS #5 Hx renal cell Ca s/p left nephrectomy #6 diverticulosis #7 inflammatory colon polyps 2014- 10 year follow up #8obesity  Plan; cbc.cmet,lipase, hyplori stool AG Start Protonix 40 mg po qam Refill Zofran for prn use- suggested one qam until sxs improve Pt will call in 2 weeks if sxs not significantly improved - then consider further w/u with EGD    Shyra Emile S Waldemar Siegel PA-C 10/06/2015   Cc: Midge Minium, MD

## 2015-10-11 NOTE — Progress Notes (Signed)
Agree with Ms. Genia Harold assessment and plan. Gatha Mayer, MD, [facility]   One change - let us contact her and stop PPI x 2 weeks and do H pylori stool antigen please (after 2 weeks up) - no PPI or H2 blocker until that is done  I cced Pam to do this

## 2015-10-20 ENCOUNTER — Telehealth: Payer: Self-pay | Admitting: Behavioral Health

## 2015-10-20 ENCOUNTER — Encounter: Payer: Self-pay | Admitting: Behavioral Health

## 2015-10-20 NOTE — Telephone Encounter (Signed)
Pre-Visit Call completed with patient and chart updated.   Pre-Visit Info documented in Specialty Comments under [employer].     Patient rescheduled appointment for 10/26/15 at 1:30 PM.

## 2015-10-21 ENCOUNTER — Encounter: Payer: Self-pay | Admitting: Family Medicine

## 2015-10-26 ENCOUNTER — Ambulatory Visit (INDEPENDENT_AMBULATORY_CARE_PROVIDER_SITE_OTHER): Payer: Commercial Managed Care - HMO | Admitting: Family Medicine

## 2015-10-26 ENCOUNTER — Encounter: Payer: Self-pay | Admitting: Family Medicine

## 2015-10-26 ENCOUNTER — Encounter (INDEPENDENT_AMBULATORY_CARE_PROVIDER_SITE_OTHER): Payer: Self-pay

## 2015-10-26 VITALS — BP 142/82 | HR 83 | Temp 98.0°F | Resp 16 | Ht 61.0 in | Wt 235.0 lb

## 2015-10-26 DIAGNOSIS — Z1231 Encounter for screening mammogram for malignant neoplasm of breast: Secondary | ICD-10-CM

## 2015-10-26 DIAGNOSIS — E11319 Type 2 diabetes mellitus with unspecified diabetic retinopathy without macular edema: Secondary | ICD-10-CM | POA: Diagnosis not present

## 2015-10-26 DIAGNOSIS — Z794 Long term (current) use of insulin: Secondary | ICD-10-CM | POA: Diagnosis not present

## 2015-10-26 DIAGNOSIS — Z0000 Encounter for general adult medical examination without abnormal findings: Secondary | ICD-10-CM

## 2015-10-26 DIAGNOSIS — E1165 Type 2 diabetes mellitus with hyperglycemia: Secondary | ICD-10-CM | POA: Diagnosis not present

## 2015-10-26 DIAGNOSIS — I10 Essential (primary) hypertension: Secondary | ICD-10-CM

## 2015-10-26 NOTE — Progress Notes (Signed)
   Subjective:    Patient ID: [name], female    DOB: 03/10/1956, 59 y.o.   MRN: [id number]  HPI Here today for CPE.  Risk Factors: HTN- chronic problem, on HCTZ.  Stopped ACE inhibitor due to increased Cr in setting of solitary kidney DM- chronic problem.  Pt is supposed to see Dr Cruzita Lederer b/c last A1C was 14.  Has not been seen since August and has no upcoming appts.  Currently on NPH BID.  Had microalbumin done on 9/22. Obesity- chronic problem.  Pt continues to gain weight.  Started to exercise, has not changed diet. [facility] Risk: low Depression: denies current sxs Hearing: normal to conversational tones and whispered voice at 6 ft ADL's: independent Cognitive: normal linear thought process, memory and attention intact Home Safety: safe at home Height, Weight, BMI, Visual Acuity: see vitals, vision corrected to 20/20 w/ glasses Counseling: UTD on colonoscopy (due 2024), overdue on mammo (plans to go downstairs), no need for pap.  Declines flu and PNA shots Care team reviewed and updated Labs Ordered: See A&P Care Plan: See A&P    Review of Systems Patient reports no vision/ hearing changes, adenopathy,fever, weight change,  persistant/recurrent hoarseness , swallowing issues, chest pain, palpitations, edema, persistant/recurrent cough, hemoptysis, dyspnea (rest/exertional/paroxysmal nocturnal), gastrointestinal bleeding (melena, rectal bleeding), significant heartburn, bowel changes, GU symptoms (dysuria, hematuria, incontinence), Gyn symptoms (abnormal  bleeding, pain),  syncope, focal weakness, memory loss, numbness & tingling, skin/hair/nail changes, abnormal bruising or bleeding, anxiety, or depression.   + abd pain- seeing Dr Carlean Purl    Objective:   Physical Exam General Appearance:    Alert, cooperative, no distress, appears stated age, obese  Head:    Normocephalic, without obvious abnormality, atraumatic  Eyes:    PERRL,  conjunctiva/corneas clear, EOM's intact, fundi    benign, both eyes  Ears:    Normal TM's and external ear canals, both ears  Nose:   Nares normal, septum midline, mucosa normal, no drainage    or sinus tenderness  Throat:   Lips, mucosa, and tongue normal; teeth and gums normal  Neck:   Supple, symmetrical, trachea midline, no adenopathy;    Thyroid: no enlargement/tenderness/nodules  Back:     Symmetric, no curvature, ROM normal, no CVA tenderness  Lungs:     Clear to auscultation bilaterally, respirations unlabored  Chest Wall:    No tenderness or deformity   Heart:    Regular rate and rhythm, S1 and S2 normal, no murmur, rub   or gallop  Breast Exam:    Deferred to mammo  Abdomen:     Soft, non-tender, bowel sounds active all four quadrants,    no masses, no organomegaly  Genitalia:    Deferred  Rectal:    Extremities:   Extremities normal, atraumatic, no cyanosis or edema  Pulses:   2+ and symmetric all extremities  Skin:   Skin color, texture, turgor normal, no rashes or lesions  Lymph nodes:   Cervical, supraclavicular, and axillary nodes normal  Neurologic:   CNII-XII intact, normal strength, sensation and reflexes    throughout          Assessment & Plan:

## 2015-10-26 NOTE — Progress Notes (Signed)
Pre visit review using our clinic review tool, if applicable. No additional management support is needed unless otherwise documented below in the visit note. 

## 2015-10-26 NOTE — Patient Instructions (Signed)
Follow up in 6 months to recheck BP and cholesterol We'll notify you of your lab results and make any changes if needed Go downstairs and schedule your mammogram Continue to work on healthy diet and regular exercise- you can do it! You are up to date on colonoscopy until 2024- yay! Call and schedule an appt with Dr Cruzita Lederer- this is very important!! Call with any questions or concerns If you want to join [place] at the new [place] office, any scheduled appointments will automatically transfer and we will see you at [address] [place] [address] [place], [place] [address] (OPENING 11/17/15) Happy Holidays!!!

## 2015-10-27 LAB — LIPID PANEL
CHOLESTEROL: 196 mg/dL (ref 0–200)
HDL: 45.4 mg/dL (ref >39.00)
LDL Cholesterol: 122 mg/dL — ABNORMAL HIGH (ref 0–99)
NONHDL: 150.36
Total CHOL/HDL Ratio: 4
Triglycerides: 140 mg/dL (ref 0.0–149.0)
VLDL: 28 mg/dL (ref 0.0–40.0)

## 2015-10-27 LAB — CBC WITH DIFFERENTIAL/PLATELET
BASOS ABS: 0.1 10*3/uL (ref 0.0–0.1)
BASOS PCT: 0.9 % (ref 0.0–3.0)
Eosinophils Absolute: 0.2 10*3/uL (ref 0.0–0.7)
Eosinophils Relative: 1.8 % (ref 0.0–5.0)
HEMATOCRIT: 42.9 % (ref 36.0–46.0)
Hemoglobin: 13.8 g/dL (ref 12.0–15.0)
LYMPHS ABS: 3.4 10*3/uL (ref 0.7–4.0)
LYMPHS PCT: 39 % (ref 12.0–46.0)
MCHC: 32.1 g/dL (ref 30.0–36.0)
MCV: 86.3 fl (ref 78.0–100.0)
MONOS PCT: 6 % (ref 3.0–12.0)
Monocytes Absolute: 0.5 10*3/uL (ref 0.1–1.0)
NEUTROS ABS: 4.6 10*3/uL (ref 1.4–7.7)
NEUTROS PCT: 52.3 % (ref 43.0–77.0)
PLATELETS: 214 10*3/uL (ref 150.0–400.0)
RBC: 4.97 Mil/uL (ref 3.87–5.11)
RDW: 14.9 % (ref 11.5–15.5)
WBC: 8.8 10*3/uL (ref 4.0–10.5)

## 2015-10-27 LAB — HEPATIC FUNCTION PANEL
ALK PHOS: 104 U/L (ref 39–117)
ALT: 15 U/L (ref 0–35)
AST: 15 U/L (ref 0–37)
Albumin: 3.3 g/dL — ABNORMAL LOW (ref 3.5–5.2)
BILIRUBIN DIRECT: 0 mg/dL (ref 0.0–0.3)
BILIRUBIN TOTAL: 0.2 mg/dL (ref 0.2–1.2)
TOTAL PROTEIN: 7.6 g/dL (ref 6.0–8.3)

## 2015-10-27 LAB — BASIC METABOLIC PANEL
BUN: 25 mg/dL — ABNORMAL HIGH (ref 6–23)
CO2: 29 meq/L (ref 19–32)
Calcium: 9.5 mg/dL (ref 8.4–10.5)
Chloride: 99 mEq/L (ref 96–112)
Creatinine, Ser: 1.24 mg/dL — ABNORMAL HIGH (ref 0.40–1.20)
GFR: 57.14 mL/min — AB (ref >60.00)
GLUCOSE: 253 mg/dL — AB (ref 70–99)
POTASSIUM: 4.1 meq/L (ref 3.5–5.1)
SODIUM: 136 meq/L (ref 135–145)

## 2015-10-27 LAB — HEMOGLOBIN A1C: HEMOGLOBIN A1C: 12.2 % — AB (ref 4.6–6.5)

## 2015-10-27 LAB — TSH: TSH: 1.12 u[IU]/mL (ref 0.35–4.50)

## 2015-10-27 NOTE — Assessment & Plan Note (Signed)
Ongoing issue for pt.  [facility]  Applauded her efforts.  Stressed that weight loss is very important in her overall health and well being.  Will continue to follow.

## 2015-10-27 NOTE — Assessment & Plan Note (Signed)
Chronic problem.  Pt is overdue for appt w/ Endo.  UTD on eye exam, foot exam, microalbumin.  Check A1C to have available for endo review at upcoming appt.

## 2015-10-27 NOTE — Assessment & Plan Note (Signed)
Pt's exam unchanged from previous.  Due for mammo- order entered.  UTD on colonoscopy- not due until 2024.  Written screening schedule updated and given to pt.  No need for paps due to hysterectomy.  Pt declined flu shot.  Check labs.  Anticipatory guidance provided.

## 2015-10-27 NOTE — Assessment & Plan Note (Addendum)
Chronic problem.  BP control is adequate but not ideal.  Unable to use ACE/ARB in setting of solitary kidney.  No med changes at this time but stressed need for low Na diet and regular exercise.  Will follow.

## 2015-10-28 ENCOUNTER — Encounter: Payer: Self-pay | Admitting: Family Medicine

## 2015-10-28 NOTE — Progress Notes (Signed)
   Subjective:    Patient ID: [name], female    DOB: 1956/05/24, 59 y.o.   MRN: [id number]  HPI POA/Advanced Directive: pt does not have either at this time, encouraged to discuss her wishes with her family and complete the necessary paperwork   Review of Systems     Objective:   Physical Exam        Assessment & Plan:

## 2015-11-01 ENCOUNTER — Other Ambulatory Visit: Payer: Self-pay | Admitting: Family Medicine

## 2015-11-02 ENCOUNTER — Ambulatory Visit (INDEPENDENT_AMBULATORY_CARE_PROVIDER_SITE_OTHER): Payer: Commercial Managed Care - HMO | Admitting: Internal Medicine

## 2015-11-02 ENCOUNTER — Encounter: Payer: Self-pay | Admitting: Internal Medicine

## 2015-11-02 VITALS — BP 156/74 | HR 84 | Ht 60.75 in | Wt 235.1 lb

## 2015-11-02 DIAGNOSIS — E118 Type 2 diabetes mellitus with unspecified complications: Secondary | ICD-10-CM

## 2015-11-02 DIAGNOSIS — E1165 Type 2 diabetes mellitus with hyperglycemia: Secondary | ICD-10-CM | POA: Diagnosis not present

## 2015-11-02 DIAGNOSIS — Z794 Long term (current) use of insulin: Secondary | ICD-10-CM

## 2015-11-02 DIAGNOSIS — IMO0002 Reserved for concepts with insufficient information to code with codable children: Secondary | ICD-10-CM

## 2015-11-02 DIAGNOSIS — K589 Irritable bowel syndrome without diarrhea: Secondary | ICD-10-CM

## 2015-11-02 MED ORDER — DICYCLOMINE HCL 20 MG PO TABS: 20.0000 mg | ORAL_TABLET | Freq: Four times a day (QID) | ORAL | Status: DC | PRN

## 2015-11-02 NOTE — Telephone Encounter (Signed)
Medication filled to pharmacy as requested.   

## 2015-11-02 NOTE — Progress Notes (Signed)
   Subjective:    Patient ID: [name], female    DOB: January 17, 1956, 59 y.o.   MRN: [id number] Chief complaint: Abdominal pain HPI Since he has here, she saw [name] in November. At that point she had a mildly elevated lipase but otherwise relatively unremarkable labs. She was complaining of epigastric pain. It was similar to her  H. pylori symptoms in the past. She has a pending H. pylori stool antigen but has not yet collected. She also has a hemoglobin A1c of 12.2%. She is on Humulin and 20 units a.m. and p.m. She's had a lot of intolerances to oral medications including glipizide and the mucosa. She is due to see her endocrinologist in a few days. In general she is better than when she saw [name]. She is a few days without defecation but overall has been moving her bowels normally for the most part with occasional loose stools. She has some nausea relieved by ondansetron but no vomiting. Medications, allergies, past medical history, past surgical history, family history and social history are reviewed and updated in the EMR.  Review of Systems As per history of present illness    Objective:   Physical Exam BP 156/74 mmHg  Pulse 84  Ht 5' 0.75" (1.543 m)  Wt 235 lb 2 oz (106.652 kg)  BMI 44.80 kg/m2  LMP 11/16/2002 Abdomen is soft, she is mildly tender in the left lower quadrant but not worse with muscle tension bowel sounds are present. There is no organomegaly or obvious abdominal wall hernia. Back is nontender. She is  alert and oriented 3  I have reviewed recent primary care notes and labs, and our GI note of 10/06/2015 She has had left lower quadrant and IBS problems off and on and she's had a colonoscopy in 2014 with diverticulosis in the left colon and polyps that were not precancerous. EGD done at that time as well that showed H. pylori gastritis.    Assessment & Plan:   1. IBS (irritable bowel syndrome)   2. Uncontrolled type 2 diabetes mellitus with complication,  with long-term current use of insulin ([name])    I explained to her that her main problem is her uncontrolled type 2 diabetes mellitus.  She really needs to work on getting better control, unfortunately she seems to have a lot of side effects to the medication or at least perceived side effects. She sees endocrinology in 3 days and that's important. I will prescribe dicyclomine 20 mg every 6 hours as needed and she will see me as needed otherwise. I don't think she needs further GI workup at this time.

## 2015-11-02 NOTE — Patient Instructions (Addendum)
   We have sent the following medications to your pharmacy for you to pick up at your convenience: Dicyclomine   Work on lowering your blood sugars.   Follow up with Dr Carlean Purl as needed.    I appreciate the opportunity to care for you. Silvano Rusk, MD, [facility]

## 2015-11-03 ENCOUNTER — Ambulatory Visit (HOSPITAL_BASED_OUTPATIENT_CLINIC_OR_DEPARTMENT_OTHER): Payer: Commercial Managed Care - HMO

## 2015-11-05 ENCOUNTER — Other Ambulatory Visit: Payer: Commercial Managed Care - HMO

## 2015-11-05 DIAGNOSIS — R1013 Epigastric pain: Principal | ICD-10-CM

## 2015-11-05 DIAGNOSIS — R110 Nausea: Secondary | ICD-10-CM

## 2015-11-05 DIAGNOSIS — G8929 Other chronic pain: Secondary | ICD-10-CM

## 2015-11-07 LAB — H. PYLORI ANTIGEN, STOOL: H pylori Ag, Stl: NEGATIVE

## 2015-11-19 ENCOUNTER — Encounter: Payer: Self-pay | Admitting: Internal Medicine

## 2015-11-19 ENCOUNTER — Ambulatory Visit (INDEPENDENT_AMBULATORY_CARE_PROVIDER_SITE_OTHER): Payer: Medicare HMO | Admitting: Internal Medicine

## 2015-11-19 VITALS — BP 130/86 | HR 90 | Temp 97.7°F | Resp 12 | Wt 232.8 lb

## 2015-11-19 DIAGNOSIS — E11319 Type 2 diabetes mellitus with unspecified diabetic retinopathy without macular edema: Secondary | ICD-10-CM

## 2015-11-19 DIAGNOSIS — E1165 Type 2 diabetes mellitus with hyperglycemia: Secondary | ICD-10-CM

## 2015-11-19 NOTE — Progress Notes (Signed)
Patient ID: [name], female   DOB: 09/08/1956, 60 y.o.   MRN: [id number]  HPI: Deette Dulworth is a 60 y.o.-year-old female, returning for management of DM2, dx 1995, insulin-dependent, uncontrolled, with complications (CKD, retinopathy). Last visit 4 mo ago. She will change insurances to [employer].   Patient is noncompliant with her insulin regimen and visits. She schedules urgent appointments for sugars in the 400s-500s as she is out of insulin for few weeks. I had a long discussion with her at last visits about the importance of getting in control of her diabetes.  Last hemoglobin A1c was: Lab Results  Component Value Date   HGBA1C 12.2* 10/26/2015   HGBA1C 14.1 06/16/2015   HGBA1C 12.5* 08/29/2014   She is on: - NPH 20 units in am and 20 units at bedtime (vials) - Glipizide 5 mg before b'fast and dinner (added 07/2015) - not taking it every day!?  She stopped Metformin 1000 mg bid We stopped Cycloset 2.4 mg >> of nausea/vomiting.  She was on Humalog 6 units with largest meal of the day >> stopped We had to stop Metformin as she has a high creatinine and we stopped Victoza as she developed pancreatitis: AP and nausea and was on liquids for 2 weeks.  She has been on JanuMet, Metformin, Glyburide. She was on and off meds due to insurance - started to have insurance again on 02/2013.  All her meds are free now.  Pt is not checking  her sugars - reviewed sugars from last visit: - am: 74-109 >> mostly up to 140 >> 129-135 >> 150-200, then 200-300s, 400s >> 186-242  - after b'fast: 120 and 218 >> n/c >> 222-289 - lunch: 120s >> 278-336 >> 80-130 >> n/c >> 90-100 >> 135 >> n/c >> 190-232 - 2h after lunch: 191 >> n/c >> 189-200 >> n/c - before dinner: 120-130 >> not checking >> 115-134 >> 120 >> low 100s >> low 100s >> n/c >> 183 - 1h after dinner: 180 >> not checking >> n/c  - bedtime: 105-152 >> n/c >> 189-210 >> 189-210 No lows; she has hypoglycemia awareness at 90-100.    Pt's meals are  -reduced portions, more salads: - Breakfast: coffee (raw sugar) + egg + toast - Lunch: blackened salmon (baked) + string beans (no desert) - Dinner: baked chicken - free range/turkey - free range/ribeye + vegetables  - Snacks: graham crackers with PB, apples, cherry No juice or sodas.   She has a ReliOn glucometer.  - Has CKD, last BUN/creatinine:  Lab Results  Component Value Date   BUN 25* 10/26/2015   CREATININE 1.24* 10/26/2015  She did not tolerate ACE inhibitors in the past. - last set of lipids: Lab Results  Component Value Date   CHOL 196 10/26/2015   HDL 45.40 10/26/2015   LDLCALC 122* 10/26/2015   TRIG 140.0 10/26/2015   CHOLHDL 4 10/26/2015  She is not on a statin. - last eye exam was in 06/04/2015 (Dr Baird Cancer) >> + DR >> sent to a retina specialist >> will see him again in 6 mo. - no numbness and tingling in her feet.  I reviewed her chart and she also has a history of poorly controlled hypertension, history of renal cell carcinoma-status post nephrectomy, morbid obesity, IBS.  ROS: Constitutional: no weight gain/loss, + fatigue, + subjective hyperthermia/no hypothermia, no nocturia Eyes: no blurry vision, no xerophthalmia ENT: no sore throat, no nodules palpated in throat, no dysphagia/odynophagia, no hoarseness Cardiovascular: no CP/SOB/palpitations/leg swelling  Respiratory:+ cough/no SOB Gastrointestinal: + N/no V/D/C Musculoskeletal: no muscle/+ joint aches Skin: no rashes Neuro: + HA  I reviewed pt's medications, allergies, PMH, social hx, family hx, and changes were documented in the history of present illness. Otherwise, unchanged from my initial visit note.  PE: BP 130/86 mmHg  Pulse 90  Temp(Src) 97.7 F (36.5 C) (Oral)  Resp 12  Wt 232 lb 12.8 oz (105.597 kg)  SpO2 98%  LMP 11/16/2002 Wt Readings from Last 3 Encounters:  11/19/15 232 lb 12.8 oz (105.597 kg)  11/02/15 235 lb 2 oz (106.652 kg)  10/26/15 235 lb (106.595 kg)    Constitutional: obese, in NAD Eyes: PERRLA, EOMI, no exophthalmos ENT: moist mucous membranes, no thyromegaly, no cervical lymphadenopathy Cardiovascular: RRR, No MRG Respiratory: CTA B Gastrointestinal: abdomen soft, NT, ND, BS+ Musculoskeletal: no deformities, strength intact in all 4 Skin: moist, warm, acanthosis nigricans on neck  ASSESSMENT: 1. DM2, insulin-dependent, uncontrolled, with complications - diabetic nephropathy - DR  H/o pancreatitis episode (Stopped Victoza)  2. CKD - sees Dr Moshe Cipro - h/o Kidney cancer >> L kidney removed >> Dr Risa Grill   PLAN:  1. Patient with long-standing, uncontrolled diabetes, with poor compliance with medications, sugar checks, and visits. Since last visit, she did not take Glipizide daily... (unclear why). She does not bring a log as she did not check sugars over the Holidays >> advised to keep NPH at same dose, take Glipizide daily (and increase dose if needed, ~ on the CBGs, and check sugars 2x a day. - I advised her to: Patient Instructions  Please continue: - NPH 20 units 2x a day  Take: - Glipizide 5 mg before b'fast and dinner  Check sugars 2x a day and increase Glipizide to 10 mg 2x a day if sugars are still high.  Please return in 1.5 months with your sugar log.   - continue checking sugars at different times of the day - check 2 times a day, rotating checks - reviewed prev HbA1c: 14.1% >> 12.2%. - UTD with eye exam - Return to clinic in 1.5 months with sugar log

## 2015-11-19 NOTE — Patient Instructions (Signed)
Please continue: - NPH 20 units 2x a day  Take: - Glipizide 5 mg before b'fast and dinner  Check sugars 2x a day and increase Glipizide to 10 mg 2x a day if sugars are still high.  Please return in 1.5 months with your sugar log.

## 2015-12-01 ENCOUNTER — Encounter: Payer: Self-pay | Admitting: Family Medicine

## 2015-12-01 ENCOUNTER — Ambulatory Visit (INDEPENDENT_AMBULATORY_CARE_PROVIDER_SITE_OTHER): Payer: Medicare HMO | Admitting: Family Medicine

## 2015-12-01 VITALS — BP 152/100 | HR 101 | Temp 98.5°F | Ht 61.0 in | Wt 236.4 lb

## 2015-12-01 DIAGNOSIS — R0789 Other chest pain: Secondary | ICD-10-CM | POA: Diagnosis not present

## 2015-12-01 DIAGNOSIS — M546 Pain in thoracic spine: Secondary | ICD-10-CM | POA: Diagnosis not present

## 2015-12-01 LAB — TROPONIN I: TNIDX: 0.01 ug/l (ref 0.00–0.06)

## 2015-12-01 MED ORDER — RANITIDINE HCL 300 MG PO TABS: 300.0000 mg | ORAL_TABLET | Freq: Every day | ORAL | Status: DC

## 2015-12-01 MED ORDER — CYCLOBENZAPRINE HCL 10 MG PO TABS: 10.0000 mg | ORAL_TABLET | Freq: Three times a day (TID) | ORAL | Status: DC | PRN

## 2015-12-01 MED ORDER — DICLOFENAC SODIUM 1 % TD GEL: 2.0000 g | Freq: Four times a day (QID) | TRANSDERMAL | Status: DC

## 2015-12-01 NOTE — Assessment & Plan Note (Signed)
Pt w/ visible spasm and TTP over L scapula.  Unable to take NSAIDs due to renal issues and unable to use prednisone due to DM.  Start Flexeril, topical Voltaren, heat prn.  Reviewed supportive care and red flags that should prompt return.  Pt expressed understanding and is in agreement w/ plan.

## 2015-12-01 NOTE — Progress Notes (Signed)
Pre visit review using our clinic review tool, if applicable. No additional management support is needed unless otherwise documented below in the visit note. 

## 2015-12-01 NOTE — Progress Notes (Signed)
   Subjective:    Patient ID: [name], female    DOB: 07/03/1956, 60 y.o.   MRN: [id number]  HPI  L back pain- 'like somebody took a 2x4 and hit me across my back'.  Described as a 'throb' and 'burns'.  TTP.  sxs started 3 weeks ago.  No known injury.  Pt reports she started exercising.  No numbness of L arm.  Has had intermittent 'gas pain' in chest.  Yesterday pain was 9-10/10.  Pain will wax and wane but it doesn't resolve.  No chest pressure or SOB.  Last episode of chest pain was yesterday- occurred at rest.  No association w/ exertion.   Review of Systems For ROS see HPI     Objective:   Physical Exam  Constitutional: She is oriented to person, place, and time. She appears well-developed and well-nourished. No distress.  HENT:  Head: Normocephalic and atraumatic.  Cardiovascular: Normal rate, regular rhythm, normal heart sounds and intact distal pulses.   Pulmonary/Chest: Effort normal and breath sounds normal. No respiratory distress. She has no wheezes. She has no rales.  Musculoskeletal:  Very TTP over L scapula No pain w/ forward flexion of L shoulder or abduction  Neurological: She is alert and oriented to person, place, and time.  Skin: Skin is warm and dry. No rash noted.  Psychiatric: She has a normal mood and affect. Her behavior is normal. Thought content normal.  Vitals reviewed.         Assessment & Plan:

## 2015-12-01 NOTE — Patient Instructions (Signed)
Follow up as needed We'll notify you of your lab results and make any changes if needed Start the Flexeril for the spasm- will cause drowsiness Use the Voltaren gel for pain relief Use the heating pad for pain relief- do not sleep with this on Start the Ranitidine nightly to decrease the gas pressure Call with any questions or concerns If chest pain changes or worsens- please go to [place] in there!!!

## 2015-12-01 NOTE — Assessment & Plan Note (Signed)
New.  Pt reports 'gas pain around my heart'.  sxs do not occur w/ exertion and will intermittently occur at rest.  No associated SOB, diaphoresis, or nausea.  EKG unchanged from previous.  Pt is currently asymptomatic.  Suspicion for cardiac etiology is low but she does have risk factors- HTN, hyperlipidemia, DM.  Check Troponin to r/o heart strain/damage.  Start H2 blocker.  Reviewed supportive care and red flags that should prompt return.  Pt expressed understanding and is in agreement w/ plan.

## 2015-12-17 ENCOUNTER — Ambulatory Visit (INDEPENDENT_AMBULATORY_CARE_PROVIDER_SITE_OTHER): Payer: Medicare HMO | Admitting: Family Medicine

## 2015-12-17 ENCOUNTER — Encounter: Payer: Self-pay | Admitting: Family Medicine

## 2015-12-17 VITALS — BP 150/94 | HR 107 | Temp 97.9°F | Ht 61.0 in | Wt 235.2 lb

## 2015-12-17 DIAGNOSIS — M546 Pain in thoracic spine: Secondary | ICD-10-CM | POA: Diagnosis not present

## 2015-12-17 MED ORDER — GABAPENTIN 300 MG PO CAPS: 300.0000 mg | ORAL_CAPSULE | Freq: Two times a day (BID) | ORAL | Status: DC

## 2015-12-17 MED ORDER — VALACYCLOVIR HCL 1 G PO TABS: 1000.0000 mg | ORAL_TABLET | Freq: Three times a day (TID) | ORAL | Status: DC

## 2015-12-17 NOTE — Progress Notes (Signed)
   Subjective:    Patient ID: Trisha Mangle, female    DOB: [birth year], 60 y.o.   MRN: [id number]  HPI Thoracic back pain- pt continues to have severe back pain.  Now having TTP in chest.  Pt reports pain is worse when lying on either side and breasts shift position.  Some itching and burning of skin.  Pain is described as 'lightning'.  Denies CP, SOB, N/V, diaphoresis.  This has been going on for 6 weeks.  No rash on skin.   Review of Systems For ROS see HPI     Objective:   Physical Exam  Constitutional: She is oriented to person, place, and time. She appears well-developed and well-nourished. No distress.  obese  HENT:  Head: Normocephalic and atraumatic.  Cardiovascular: Normal rate, regular rhythm and normal heart sounds.   Pulmonary/Chest: Effort normal and breath sounds normal. No respiratory distress. She has no wheezes. She has no rales. She exhibits tenderness (TTP over L upper chest w/ gentle pressure).  Musculoskeletal: She exhibits tenderness (pt's L thoracic back exquisitely TTP w/ gentle touch). She exhibits no edema.  Neurological: She is alert and oriented to person, place, and time.  Skin: Skin is warm and dry. No rash (no rash or shingles present) noted. No erythema.  Psychiatric: She has a normal mood and affect. Her behavior is normal.  Vitals reviewed.         Assessment & Plan:

## 2015-12-17 NOTE — Progress Notes (Signed)
Pre visit review using our clinic review tool, if applicable. No additional management support is needed unless otherwise documented below in the visit note. 

## 2015-12-17 NOTE — Assessment & Plan Note (Signed)
Deteriorated.  Pt's pain now radiates around to front of chest.  Same, exquisite TTP w/ light touch- pain described as 'lightning' or 'burning'.  This pain distribution and description of neuropathic pain is consistent w/ shingles despite absence of rash.  Duration of 6 weeks is atypical for viral etiology but due to her sxs, will start empiric Valtrex.  For pain relief, will start Gabapentin.  Pain is not consistent w/ cardiac etiology as pain is very superficial.  If no improvement w/ Valtrex and Gabapentin will refer to ortho for complete evaluation of pain causes.  Pt expressed understanding and is in agreement w/ plan.

## 2015-12-17 NOTE — Patient Instructions (Signed)
Follow up by phone or MyChart in 1 week to let me know how the pain is Start the Valtrex 3x/day for possible shingles related pain Take the Gabapentin twice daily for nerve pain If no improvement or worsening, let me know and we'll send you to ortho for complete evaluation Call with any questions or concerns Hang in there!!!

## 2015-12-24 DIAGNOSIS — E113393 Type 2 diabetes mellitus with moderate nonproliferative diabetic retinopathy without macular edema, bilateral: Secondary | ICD-10-CM | POA: Diagnosis not present

## 2015-12-30 ENCOUNTER — Ambulatory Visit (INDEPENDENT_AMBULATORY_CARE_PROVIDER_SITE_OTHER): Payer: Medicare HMO | Admitting: Family Medicine

## 2015-12-30 ENCOUNTER — Encounter: Payer: Self-pay | Admitting: Family Medicine

## 2015-12-30 VITALS — BP 150/90 | HR 91 | Temp 97.1°F | Ht 61.0 in | Wt 233.2 lb

## 2015-12-30 DIAGNOSIS — G548 Other nerve root and plexus disorders: Secondary | ICD-10-CM | POA: Diagnosis not present

## 2015-12-30 DIAGNOSIS — M792 Neuralgia and neuritis, unspecified: Secondary | ICD-10-CM | POA: Insufficient documentation

## 2015-12-30 MED ORDER — DULOXETINE HCL 30 MG PO CPEP: 30.0000 mg | ORAL_CAPSULE | Freq: Every day | ORAL | Status: DC

## 2015-12-30 MED ORDER — GABAPENTIN 300 MG PO CAPS: 300.0000 mg | ORAL_CAPSULE | Freq: Three times a day (TID) | ORAL | Status: DC

## 2015-12-30 NOTE — Patient Instructions (Signed)
Follow up by phone or MyChart on Monday if no improvement Increase the Gabapentin to 3x/day Add the Cymbalta daily to help w/ pain If no improvement, we'll send you to neurology for complete evaluation Call with any questions or concerns Hang in there!!!

## 2015-12-30 NOTE — Progress Notes (Signed)
   Subjective:    Patient ID: [name], female    DOB: Sep 03, 1956, 60 y.o.   MRN: [id number]  HPI Neuralgia- pt reports 8 weeks of sxs.  This is 3rd visit for this issue.  Was started on Gabapentin last visit w/o improvement.  Pain is limited to L side starting at sternum and radiating around under arm and to back.  Pain is a lightning, burning pain.  Some itching.  Very painful to touch.   Review of Systems For ROS see HPI     Objective:   Physical Exam  Constitutional: She is oriented to person, place, and time. She appears well-developed and well-nourished. No distress.  HENT:  Head: Normocephalic and atraumatic.  Neurological: She is alert and oriented to person, place, and time. No cranial nerve deficit. Coordination normal.  Very TTP over L chest and back in dermatomal pattern that does not cross the midline  Skin: Skin is warm and dry. No rash noted. No erythema.  Psychiatric: She has a normal mood and affect. Her behavior is normal. Thought content normal.  Vitals reviewed.         Assessment & Plan:

## 2015-12-30 NOTE — Progress Notes (Signed)
Pre visit review using our clinic review tool, if applicable. No additional management support is needed unless otherwise documented below in the visit note. 

## 2015-12-30 NOTE — Assessment & Plan Note (Signed)
New.  Pt's sxs are consistent w/ nerve mediated pain- dermatomal, lightning and burning.  No relief w/ gabapentin- will titrate and monitor for improvement.  Will also start Cymbalta for pain indication.  If no improvement, will need neuro evaluation.  Pt expressed understanding and is in agreement w/ plan.

## 2016-01-01 ENCOUNTER — Ambulatory Visit: Payer: Commercial Managed Care - HMO | Admitting: Internal Medicine

## 2016-02-01 ENCOUNTER — Other Ambulatory Visit: Payer: Self-pay | Admitting: Internal Medicine

## 2016-03-19 ENCOUNTER — Other Ambulatory Visit: Payer: Self-pay | Admitting: Internal Medicine

## 2016-03-21 ENCOUNTER — Other Ambulatory Visit: Payer: Self-pay | Admitting: *Deleted

## 2016-03-21 MED ORDER — INSULIN SYRINGE-NEEDLE U-100 31G X 5/16" 0.5 ML MISC
Status: DC
Start: 2016-03-21 — End: 2018-01-06

## 2016-04-15 ENCOUNTER — Other Ambulatory Visit: Payer: Self-pay | Admitting: Internal Medicine

## 2016-05-30 DIAGNOSIS — E11319 Type 2 diabetes mellitus with unspecified diabetic retinopathy without macular edema: Secondary | ICD-10-CM | POA: Diagnosis not present

## 2016-05-30 DIAGNOSIS — Z6841 Body Mass Index (BMI) 40.0 and over, adult: Secondary | ICD-10-CM | POA: Diagnosis not present

## 2016-05-30 DIAGNOSIS — E1121 Type 2 diabetes mellitus with diabetic nephropathy: Secondary | ICD-10-CM | POA: Diagnosis not present

## 2016-05-30 DIAGNOSIS — Z905 Acquired absence of kidney: Secondary | ICD-10-CM | POA: Insufficient documentation

## 2016-05-30 DIAGNOSIS — Z85528 Personal history of other malignant neoplasm of kidney: Secondary | ICD-10-CM | POA: Insufficient documentation

## 2016-05-30 DIAGNOSIS — E782 Mixed hyperlipidemia: Secondary | ICD-10-CM | POA: Diagnosis not present

## 2016-05-30 DIAGNOSIS — Z794 Long term (current) use of insulin: Secondary | ICD-10-CM | POA: Diagnosis not present

## 2016-05-30 DIAGNOSIS — K589 Irritable bowel syndrome without diarrhea: Secondary | ICD-10-CM | POA: Diagnosis present

## 2016-05-30 DIAGNOSIS — Z1159 Encounter for screening for other viral diseases: Secondary | ICD-10-CM | POA: Diagnosis not present

## 2016-05-30 DIAGNOSIS — E1165 Type 2 diabetes mellitus with hyperglycemia: Secondary | ICD-10-CM | POA: Diagnosis not present

## 2016-06-20 DIAGNOSIS — Z794 Long term (current) use of insulin: Secondary | ICD-10-CM | POA: Diagnosis not present

## 2016-06-20 DIAGNOSIS — E11319 Type 2 diabetes mellitus with unspecified diabetic retinopathy without macular edema: Secondary | ICD-10-CM | POA: Diagnosis not present

## 2016-06-20 DIAGNOSIS — Z6841 Body Mass Index (BMI) 40.0 and over, adult: Secondary | ICD-10-CM | POA: Diagnosis not present

## 2016-06-20 DIAGNOSIS — E1165 Type 2 diabetes mellitus with hyperglycemia: Secondary | ICD-10-CM | POA: Diagnosis not present

## 2016-06-20 DIAGNOSIS — I10 Essential (primary) hypertension: Secondary | ICD-10-CM | POA: Diagnosis not present

## 2016-06-20 DIAGNOSIS — E1121 Type 2 diabetes mellitus with diabetic nephropathy: Secondary | ICD-10-CM | POA: Diagnosis not present

## 2016-06-22 DIAGNOSIS — H3582 Retinal ischemia: Secondary | ICD-10-CM | POA: Diagnosis not present

## 2016-06-22 DIAGNOSIS — E113393 Type 2 diabetes mellitus with moderate nonproliferative diabetic retinopathy without macular edema, bilateral: Secondary | ICD-10-CM | POA: Diagnosis not present

## 2016-06-22 DIAGNOSIS — H35413 Lattice degeneration of retina, bilateral: Secondary | ICD-10-CM | POA: Diagnosis not present

## 2016-06-22 DIAGNOSIS — H3562 Retinal hemorrhage, left eye: Secondary | ICD-10-CM | POA: Diagnosis not present

## 2016-07-07 ENCOUNTER — Telehealth: Payer: Self-pay

## 2016-07-07 NOTE — Telephone Encounter (Signed)
Patient is on the list for [employer] 2017 and may be a good candidate for an AWV in 2017. Please let me know if/when appt is scheduled.   

## 2016-07-19 DIAGNOSIS — Z6841 Body Mass Index (BMI) 40.0 and over, adult: Secondary | ICD-10-CM | POA: Diagnosis not present

## 2016-07-19 DIAGNOSIS — E11319 Type 2 diabetes mellitus with unspecified diabetic retinopathy without macular edema: Secondary | ICD-10-CM | POA: Diagnosis not present

## 2016-07-19 DIAGNOSIS — I10 Essential (primary) hypertension: Secondary | ICD-10-CM | POA: Diagnosis not present

## 2016-07-19 DIAGNOSIS — E782 Mixed hyperlipidemia: Secondary | ICD-10-CM | POA: Diagnosis not present

## 2016-07-19 DIAGNOSIS — E1169 Type 2 diabetes mellitus with other specified complication: Secondary | ICD-10-CM | POA: Insufficient documentation

## 2016-07-19 DIAGNOSIS — Z794 Long term (current) use of insulin: Secondary | ICD-10-CM | POA: Diagnosis not present

## 2016-07-19 DIAGNOSIS — E1121 Type 2 diabetes mellitus with diabetic nephropathy: Secondary | ICD-10-CM | POA: Diagnosis not present

## 2016-07-19 DIAGNOSIS — E785 Hyperlipidemia, unspecified: Secondary | ICD-10-CM | POA: Insufficient documentation

## 2016-07-19 DIAGNOSIS — E1165 Type 2 diabetes mellitus with hyperglycemia: Secondary | ICD-10-CM | POA: Diagnosis not present

## 2016-08-05 ENCOUNTER — Emergency Department (HOSPITAL_COMMUNITY): Payer: Medicare HMO

## 2016-08-05 ENCOUNTER — Encounter (HOSPITAL_COMMUNITY): Payer: Self-pay | Admitting: Emergency Medicine

## 2016-08-05 ENCOUNTER — Inpatient Hospital Stay (HOSPITAL_COMMUNITY)
Admission: EM | Admit: 2016-08-05 | Discharge: 2016-08-11 | DRG: 872 | Disposition: A | Discharge status: Home / Self Care | Payer: Medicare HMO | Attending: Nephrology | Admitting: Nephrology

## 2016-08-05 DIAGNOSIS — A419 Sepsis, unspecified organism: Secondary | ICD-10-CM | POA: Diagnosis not present

## 2016-08-05 DIAGNOSIS — K5792 Diverticulitis of intestine, part unspecified, without perforation or abscess without bleeding: Secondary | ICD-10-CM | POA: Diagnosis not present

## 2016-08-05 DIAGNOSIS — K5732 Diverticulitis of large intestine without perforation or abscess without bleeding: Secondary | ICD-10-CM

## 2016-08-05 DIAGNOSIS — Z6841 Body Mass Index (BMI) 40.0 and over, adult: Secondary | ICD-10-CM

## 2016-08-05 DIAGNOSIS — I129 Hypertensive chronic kidney disease with stage 1 through stage 4 chronic kidney disease, or unspecified chronic kidney disease: Secondary | ICD-10-CM | POA: Diagnosis present

## 2016-08-05 DIAGNOSIS — E11319 Type 2 diabetes mellitus with unspecified diabetic retinopathy without macular edema: Secondary | ICD-10-CM | POA: Diagnosis not present

## 2016-08-05 DIAGNOSIS — N182 Chronic kidney disease, stage 2 (mild): Secondary | ICD-10-CM | POA: Diagnosis not present

## 2016-08-05 DIAGNOSIS — R1032 Left lower quadrant pain: Secondary | ICD-10-CM | POA: Diagnosis not present

## 2016-08-05 DIAGNOSIS — Z803 Family history of malignant neoplasm of breast: Secondary | ICD-10-CM

## 2016-08-05 DIAGNOSIS — N183 Chronic kidney disease, stage 3 unspecified: Secondary | ICD-10-CM | POA: Diagnosis present

## 2016-08-05 DIAGNOSIS — Z90710 Acquired absence of both cervix and uterus: Secondary | ICD-10-CM

## 2016-08-05 DIAGNOSIS — E1165 Type 2 diabetes mellitus with hyperglycemia: Secondary | ICD-10-CM | POA: Diagnosis not present

## 2016-08-05 DIAGNOSIS — E1122 Type 2 diabetes mellitus with diabetic chronic kidney disease: Secondary | ICD-10-CM | POA: Diagnosis present

## 2016-08-05 DIAGNOSIS — I152 Hypertension secondary to endocrine disorders: Secondary | ICD-10-CM | POA: Diagnosis present

## 2016-08-05 DIAGNOSIS — Z9049 Acquired absence of other specified parts of digestive tract: Secondary | ICD-10-CM

## 2016-08-05 DIAGNOSIS — E1159 Type 2 diabetes mellitus with other circulatory complications: Secondary | ICD-10-CM | POA: Diagnosis present

## 2016-08-05 DIAGNOSIS — I10 Essential (primary) hypertension: Secondary | ICD-10-CM

## 2016-08-05 DIAGNOSIS — Z8371 Family history of colonic polyps: Secondary | ICD-10-CM

## 2016-08-05 DIAGNOSIS — Z86010 Personal history of colonic polyps: Secondary | ICD-10-CM

## 2016-08-05 DIAGNOSIS — Z833 Family history of diabetes mellitus: Secondary | ICD-10-CM

## 2016-08-05 DIAGNOSIS — Z905 Acquired absence of kidney: Secondary | ICD-10-CM

## 2016-08-05 DIAGNOSIS — Z794 Long term (current) use of insulin: Secondary | ICD-10-CM

## 2016-08-05 DIAGNOSIS — K589 Irritable bowel syndrome without diarrhea: Secondary | ICD-10-CM | POA: Diagnosis present

## 2016-08-05 DIAGNOSIS — Z85528 Personal history of other malignant neoplasm of kidney: Secondary | ICD-10-CM

## 2016-08-05 DIAGNOSIS — Z79899 Other long term (current) drug therapy: Secondary | ICD-10-CM

## 2016-08-05 DIAGNOSIS — R109 Unspecified abdominal pain: Secondary | ICD-10-CM | POA: Diagnosis present

## 2016-08-05 DIAGNOSIS — Z87440 Personal history of urinary (tract) infections: Secondary | ICD-10-CM

## 2016-08-05 DIAGNOSIS — K219 Gastro-esophageal reflux disease without esophagitis: Secondary | ICD-10-CM | POA: Diagnosis present

## 2016-08-05 HISTORY — DX: Chronic kidney disease, stage 2 (mild): N18.2

## 2016-08-05 LAB — URINALYSIS, ROUTINE W REFLEX MICROSCOPIC
Bilirubin Urine: NEGATIVE
Glucose, UA: NEGATIVE mg/dL
Ketones, ur: NEGATIVE mg/dL
Leukocytes, UA: NEGATIVE
NITRITE: NEGATIVE
PH: 5.5 (ref 5.0–8.0)
Protein, ur: >300 mg/dL — AB
SPECIFIC GRAVITY, URINE: 1.02 (ref 1.005–1.030)

## 2016-08-05 LAB — COMPREHENSIVE METABOLIC PANEL
ALBUMIN: 3.1 g/dL — AB (ref 3.5–5.0)
ALK PHOS: 74 U/L (ref 38–126)
ALT: 14 U/L (ref 14–54)
ANION GAP: 8 (ref 5–15)
AST: 14 U/L — ABNORMAL LOW (ref 15–41)
BILIRUBIN TOTAL: 0.4 mg/dL (ref 0.3–1.2)
BUN: 22 mg/dL — ABNORMAL HIGH (ref 6–20)
CALCIUM: 9.1 mg/dL (ref 8.9–10.3)
CO2: 29 mmol/L (ref 22–32)
Chloride: 101 mmol/L (ref 101–111)
Creatinine, Ser: 1.15 mg/dL — ABNORMAL HIGH (ref 0.44–1.00)
GFR calc non Af Amer: 51 mL/min — ABNORMAL LOW (ref >60)
GFR, EST AFRICAN AMERICAN: 60 mL/min — AB (ref >60)
Glucose, Bld: 132 mg/dL — ABNORMAL HIGH (ref 65–99)
POTASSIUM: 3.7 mmol/L (ref 3.5–5.1)
SODIUM: 138 mmol/L (ref 135–145)
TOTAL PROTEIN: 8.3 g/dL — AB (ref 6.5–8.1)

## 2016-08-05 LAB — CBC
HEMATOCRIT: 38.2 % (ref 36.0–46.0)
HEMOGLOBIN: 12.3 g/dL (ref 12.0–15.0)
MCH: 28 pg (ref 26.0–34.0)
MCHC: 32.2 g/dL (ref 30.0–36.0)
MCV: 86.8 fL (ref 78.0–100.0)
Platelets: 280 10*3/uL (ref 150–400)
RBC: 4.4 MIL/uL (ref 3.87–5.11)
RDW: 13.8 % (ref 11.5–15.5)
WBC: 12.3 10*3/uL — ABNORMAL HIGH (ref 4.0–10.5)

## 2016-08-05 LAB — URINE MICROSCOPIC-ADD ON

## 2016-08-05 LAB — LIPASE, BLOOD: Lipase: 46 U/L (ref 11–51)

## 2016-08-05 MED ORDER — IOPAMIDOL (ISOVUE-300) INJECTION 61%
100.0000 mL | Freq: Once | INTRAVENOUS | Status: AC | PRN
  Administered 2016-08-05: 100 mL via INTRAVENOUS

## 2016-08-05 MED ORDER — SODIUM CHLORIDE 0.9 % IV BOLUS (SEPSIS)
1000.0000 mL | Freq: Once | INTRAVENOUS | Status: AC
  Administered 2016-08-05: 1000 mL via INTRAVENOUS

## 2016-08-05 MED ORDER — MORPHINE SULFATE (PF) 4 MG/ML IV SOLN
4.0000 mg | Freq: Once | INTRAVENOUS | Status: AC
  Administered 2016-08-05: 4 mg via INTRAVENOUS
  Filled 2016-08-05: qty 1

## 2016-08-05 MED ORDER — METRONIDAZOLE IN NACL 5-0.79 MG/ML-% IV SOLN
500.0000 mg | Freq: Once | INTRAVENOUS | Status: AC
  Administered 2016-08-06: 500 mg via INTRAVENOUS
  Filled 2016-08-05: qty 100

## 2016-08-05 MED ORDER — ONDANSETRON HCL 4 MG/2ML IJ SOLN
4.0000 mg | Freq: Once | INTRAMUSCULAR | Status: AC
  Administered 2016-08-05: 4 mg via INTRAVENOUS
  Filled 2016-08-05: qty 2

## 2016-08-05 MED ORDER — DEXTROSE 5 % IV SOLN
1.0000 g | Freq: Once | INTRAVENOUS | Status: AC
  Administered 2016-08-05: 1 g via INTRAVENOUS
  Filled 2016-08-05: qty 10

## 2016-08-05 NOTE — ED Provider Notes (Signed)
[facility] DEPT Provider Note   CSN: [id number] Arrival date & time: 08/05/16  1541     History   Chief Complaint Chief Complaint  Patient presents with  . Abdominal Pain    HPI [name] is a 60 y.o. female.  HPI   60 year old female presents today with left lower quadrant abdominal pain. Patient reports a significant past medical history of the same that developed into hemorrhage. She reports her last 3 days she had nausea, no vomiting and left lower quadrant pain. She reports normal bowel movements, reports that the feeling in her abdomen is similar to diverticulitis, but is not having any of the severe sign she had before. Normal bowel movement yesterday. Patient denies any fever or chills, denies any urinary symptoms.   Past Medical History:  Diagnosis Date  . Anemia   . Blood transfusion   . Blood transfusion without reported diagnosis   . CKD (chronic kidney disease) stage 2, GFR 60-89 ml/min   . Diabetes mellitus   . Diverticulosis of colon with hemorrhage 2010, 09/2011  . H/O hiatal hernia   . Helicobacter pylori gastritis 09/13/2013  . Hx: UTI (urinary tract infection)   . Hypertension   . IBS (irritable bowel syndrome)   . Personal history of colonic polyps    adenomas since 2010  . Renal cell carcinoma 2004   lt kidney removed    Patient Active Problem List   Diagnosis Date Noted  . Sepsis ([place]) 08/06/2016  . CKD (chronic kidney disease) stage 2, GFR 60-89 ml/min   . Diverticulitis of intestine without perforation or abscess without bleeding   . Diverticulitis 08/05/2016  . Neuralgia of chest 12/30/2015  . Atypical chest pain 12/01/2015  . Physical exam 10/26/2015  . Uncontrolled type 2 diabetes with retinopathy ([place]) 07/28/2015  . Maxillary sinusitis, acute 01/21/2015  . Strain of left pectoralis muscle 09/26/2014  . Benign paroxysmal positional vertigo 10/03/2013  . Nausea with vomiting 10/03/2013  . Bleeding nose 10/03/2013  .  Helicobacter pylori gastritis 09/13/2013  . Pyuria 08/20/2013  . Abnormal abdominal CT scan 08/20/2013  . Abdominal pain 08/16/2013  . Back pain 07/31/2013  . LUQ pain 07/31/2013  . Laceration of hand 07/10/2013  . Hidradenitis suppurativa of right axilla 05/31/2013  . Renal cell carcinoma   . IBS (irritable bowel syndrome)   . S/p nephrectomy   . 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 OBESITY, MORBID 09/16/2009  . Essential hypertension 06/23/2009  . Personal history of colonoc adenomas 12/12/2008    Past Surgical History:  Procedure Laterality Date  . ABDOMINAL HYSTERECTOMY    . CHOLECYSTECTOMY    . COLONOSCOPY  12/19/2008   diverticulosis  . COLONOSCOPY  11/17/2011   Procedure: COLONOSCOPY;  Surgeon: Zenovia Jarred, MD;  Location: [facility];  Service: Gastroenterology;  Laterality: N/A;  . DILATION AND CURETTAGE OF UTERUS N/A 07/11/2014   Procedure: Repair of Vaginal Cuff;  Surgeon: Melina Schools, MD;  Location: [facility];  Service: Gynecology;  Laterality: N/A;  . FLEXIBLE SIGMOIDOSCOPY  12/12/2008   diverticulosis  . HAND SURGERY     x5 right  . HYDRADENITIS EXCISION Right 03/21/2014   Procedure: EXCISION HIDRADENITIS AXILLA;  Surgeon: Pedro Earls, MD;  Location: [facility];  Service: General;  Laterality: Right;  . KNEE ARTHROSCOPY     x2 left  . NEPHRECTOMY     left  . TRIGGER FINGER RELEASE Left 06/23/2015  Procedure: RELEASE TRIGGER FINGER/A-1 PULLEY LEFT RING FINGER;  Surgeon: Daryll Brod, MD;  Location: [facility];  Service: Orthopedics;  Laterality: Left;  . TUBAL LIGATION    . UPPER GASTROINTESTINAL ENDOSCOPY  12/12/2008   gastroporesis    OB History    Gravida Para Term Preterm AB Living   _0 SAB TAB Ectopic Multiple Live Births                   Home Medications    Prior to Admission medications   Medication Sig Start Date End Date Taking? Authorizing Provider    ibuprofen (ADVIL,MOTRIN) 200 MG tablet Take 200 mg by mouth every 6 (six) hours as needed for moderate pain.   Yes Historical Provider, MD  lisinopril-hydrochlorothiazide (PRINZIDE,ZESTORETIC) 20-12.5 MG tablet Take 1 tablet by mouth daily.  07/19/16  Yes Historical Provider, MD  naproxen sodium (ANAPROX) 220 MG tablet Take 440 mg by mouth 2 (two) times daily as needed (pain).   Yes Historical Provider, MD  NOVOLOG MIX 70/30 (70-30) 100 UNIT/ML injection Inject 20-35 Units into the skin 2 (two) times daily with a meal. Inject 35 units in the am and Inject 20 units in the evening. 07/17/16  Yes Historical Provider, MD  Blood Glucose Monitoring Suppl (ONE TOUCH ULTRA MINI) W/DEVICE KIT Use to test blood sugar daily. Dx: E11.65 07/01/15   Philemon Kingdom, MD  cyclobenzaprine (FLEXERIL) 10 MG tablet Take 1 tablet (10 mg total) by mouth 3 (three) times daily as needed for muscle spasms. Patient not taking: Reported on 08/05/2016 12/01/15   Midge Minium, MD  diclofenac sodium (VOLTAREN) 1 % GEL Apply 2 g topically 4 (four) times daily. Patient not taking: Reported on 08/05/2016 12/01/15   Midge Minium, MD  dicyclomine (BENTYL) 20 MG tablet Take 1 tablet (20 mg total) by mouth every 6 (six) hours as needed for spasms (abdominal pain). Patient not taking: Reported on 08/05/2016 11/02/15   Gatha Mayer, MD  DULoxetine (CYMBALTA) 30 MG capsule Take 1 capsule (30 mg total) by mouth daily. Patient not taking: Reported on 08/05/2016 12/30/15   Midge Minium, MD  gabapentin (NEURONTIN) 300 MG capsule Take 1 capsule (300 mg total) by mouth 3 (three) times daily. Patient not taking: Reported on 08/05/2016 12/30/15   Midge Minium, MD  glipiZIDE (GLUCOTROL) 5 MG tablet TAKE ONE TABLET BY MOUTH TWICE DAILY BEFORE MEAL(S) Patient not taking: Reported on 08/05/2016 04/15/16   Elayne Snare, MD  glucose blood (ONE TOUCH ULTRA TEST) test strip Use to test blood sugar 2 times daily as instructed. Dx: E11.65 07/01/15    Philemon Kingdom, MD  hydrochlorothiazide (MICROZIDE) 12.5 MG capsule TAKE 1 CAPSULE BY MOUTH DAILY. Patient not taking: Reported on 08/05/2016 11/02/15   Midge Minium, MD  Insulin Syringe-Needle U-100 (RELION INSULIN SYR 0.5ML/31G) 31G X 5/16" 0.5 ML MISC USE ONE SYRINGE TO INJECT INSULIN TWICE DAILY AS INSTRUCTED 03/21/16   Philemon Kingdom, MD  NOVOLIN N RELION 100 UNIT/ML injection INJECT 15 UNITS SUBCUTANEOUSLY IN THE MORNING, AND 25 UNITS AT BEDTIME Patient not taking: Reported on 08/05/2016 02/01/16   Philemon Kingdom, MD  ondansetron (ZOFRAN) 4 MG tablet Take 1 tab every 6 hours as needed for nausea. Patient not taking: Reported on 08/05/2016 10/06/15   Amy S Esterwood, PA-C  ONETOUCH DELICA LANCETS FINE MISC Use to test blood sugar 2 times daily. Dx: E11.65 07/01/15   Philemon Kingdom,  MD  ranitidine (ZANTAC) 300 MG tablet Take 1 tablet (300 mg total) by mouth at bedtime. Patient not taking: Reported on 08/05/2016 12/01/15   Midge Minium, MD  valACYclovir (VALTREX) 1000 MG tablet Take 1 tablet (1,000 mg total) by mouth 3 (three) times daily. Patient not taking: Reported on 08/05/2016 12/17/15   Midge Minium, MD    Family History Family History  Problem Relation Age of Onset  . Colon polyps Mother   . Diabetes Maternal Grandmother   . Breast cancer Maternal Grandmother   . Diabetes Father   . Breast cancer Daughter   . Colon cancer Neg Hx     Social History Social History  Substance Use Topics  . Smoking status: Never Smoker  . Smokeless tobacco: Never Used  . Alcohol use No     Allergies   Review of patient's allergies indicates no known allergies.   Review of Systems Review of Systems  All other systems reviewed and are negative.   Physical Exam Updated Vital Signs BP (!) (P) 150/69 (BP Location: Right Arm)   Pulse (P) 99   Temp (P) 100.2 F (37.9 C) (Oral)   Resp (P) 20   Ht 5' 2" (1.575 m)   Wt 103 kg   LMP 11/16/2002   SpO2 (P) 98%   BMI  41.52 kg/m   Physical Exam  Constitutional: She is oriented to person, place, and time. She appears well-developed and well-nourished.  HENT:  Head: Normocephalic and atraumatic.  Eyes: Conjunctivae are normal. Pupils are equal, round, and reactive to light. Right eye exhibits no discharge. Left eye exhibits no discharge. No scleral icterus.  Neck: Normal range of motion. No JVD present. No tracheal deviation present.  Pulmonary/Chest: Effort normal. No stridor.  Abdominal:  Tenderness to palpation left lower quadrant  Neurological: She is alert and oriented to person, place, and time. Coordination normal.  Psychiatric: She has a normal mood and affect. Her behavior is normal. Judgment and thought content normal.  Nursing note and vitals reviewed.    ED Treatments / Results  Labs (all labs ordered are listed, but only abnormal results are displayed) Labs Reviewed  COMPREHENSIVE METABOLIC PANEL - Abnormal; Notable for the following:       Result Value   Glucose, Bld 132 (*)    BUN 22 (*)    Creatinine, Ser 1.15 (*)    Total Protein 8.3 (*)    Albumin 3.1 (*)    AST 14 (*)    GFR calc non Af Amer 51 (*)    GFR calc Af Amer 60 (*)    All other components within normal limits  CBC - Abnormal; Notable for the following:    WBC 12.3 (*)    All other components within normal limits  URINALYSIS, ROUTINE W REFLEX MICROSCOPIC (NOT AT [facility]) - Abnormal; Notable for the following:    APPearance CLOUDY (*)    Hgb urine dipstick TRACE (*)    Protein, ur >300 (*)    All other components within normal limits  URINE MICROSCOPIC-ADD ON - Abnormal; Notable for the following:    Squamous Epithelial / LPF 6-30 (*)    Bacteria, UA MANY (*)    All other components within normal limits  CULTURE, BLOOD (ROUTINE X 2)  CULTURE, BLOOD (ROUTINE X 2)  LIPASE, BLOOD  PROTIME-INR  APTT  LACTIC ACID, PLASMA  LACTIC ACID, PLASMA  PROCALCITONIN  BASIC METABOLIC PANEL  CBC    EKG  EKG  Interpretation None       Radiology Ct Abdomen Pelvis W Contrast  Result Date: 08/05/2016 CLINICAL DATA:  Left pelvic pain x3 days, history of diverticulitis EXAM: CT ABDOMEN AND PELVIS WITH CONTRAST TECHNIQUE: Multidetector CT imaging of the abdomen and pelvis was performed using the standard protocol following bolus administration of intravenous contrast. CONTRAST:  100 mL Isovue 300 IV COMPARISON:  CT abdomen/pelvis dated 08/16/2013 FINDINGS: Lower chest: Lung bases are clear. Hepatobiliary: Liver is within normal limits. Status post cholecystectomy. No intrahepatic or extrahepatic ductal dilatation. Pancreas: Within normal limits. Spleen: Within normal limits. Adrenals/Urinary Tract: Adrenal glands are within normal limits. Status post left nephrectomy. Right kidney is within normal limits. No hydronephrosis. Bladder is within normal limits. Stomach/Bowel: Stomach is within normal limits. No evidence of bowel obstruction. Normal appendix (series 2/image 55). Extensive colonic diverticulosis with inflammatory changes/diverticulitis involving the ascending colon (series 2/ image 61). No drainable fluid collection/abscess. No free air to suggest macroscopic perforation. Vascular/Lymphatic: No evidence of abdominal aortic aneurysm. No suspicious abdominopelvic lymphadenopathy. Reproductive: Status post hysterectomy. No adnexal masses. Other: No abdominopelvic ascites. Small fat containing right para midline ventral hernia (series 2/image 59). Musculoskeletal: Visualized osseous structures are within normal limits. IMPRESSION: Acute diverticulitis involving the ascending colon. No drainable fluid collection/abscess. No free air. No evidence of bowel obstruction.  Normal appendix. Status post left nephrectomy, cholecystectomy, and hysterectomy. Electronically Signed   By: Julian Hy M.D.   On: 08/05/2016 20:59    Procedures Procedures (including critical care time)  Medications Ordered in  ED Medications  metroNIDAZOLE (FLAGYL) IVPB 500 mg (not administered)  morphine 2 MG/ML injection 2 mg (not administered)  ondansetron (ZOFRAN) injection 4 mg (not administered)  sodium chloride 0.9 % bolus 1,500 mL (not administered)  0.9 %  sodium chloride infusion (1,000 mLs Intravenous New Bag/Given 08/06/16 0140)  insulin aspart (novoLOG) injection 0-9 Units (not administered)  enoxaparin (LOVENOX) injection 50 mg (not administered)  acetaminophen (TYLENOL) tablet 650 mg (not administered)    Or  acetaminophen (TYLENOL) suppository 650 mg (not administered)  zolpidem (AMBIEN) tablet 5 mg (not administered)  hydrALAZINE (APRESOLINE) injection 5 mg (not administered)  lisinopril (PRINIVIL,ZESTRIL) tablet 20 mg (not administered)    And  hydrochlorothiazide (MICROZIDE) capsule 12.5 mg (not administered)  naproxen (NAPROSYN) tablet 500 mg (not administered)  insulin aspart protamine- aspart (NOVOLOG MIX 70/30) injection 20 Units (not administered)  insulin aspart protamine- aspart (NOVOLOG MIX 70/30) injection 10 Units (not administered)  cefTRIAXone (ROCEPHIN) 2 g in dextrose 5 % 50 mL IVPB (not administered)  cefTRIAXone (ROCEPHIN) 1 g in dextrose 5 % 50 mL IVPB (not administered)  iopamidol (ISOVUE-300) 61 % injection 100 mL (100 mLs Intravenous Contrast Given 08/05/16 2015)  morphine 4 MG/ML injection 4 mg (4 mg Intravenous Given 08/05/16 2039)  ondansetron (ZOFRAN) injection 4 mg (4 mg Intravenous Given 08/05/16 2039)  sodium chloride 0.9 % bolus 1,000 mL (0 mLs Intravenous Stopped 08/05/16 2327)  cefTRIAXone (ROCEPHIN) 1 g in dextrose 5 % 50 mL IVPB (0 g Intravenous Stopped 08/06/16 0005)  metroNIDAZOLE (FLAGYL) IVPB 500 mg (500 mg Intravenous New Bag/Given 08/06/16 0005)     Initial Impression / Assessment and Plan / ED Course  I have reviewed the triage vital signs and the nursing notes.  Pertinent labs & imaging results that were available during my care of the patient were  reviewed by me and considered in my medical decision making (see chart for details).  Clinical Course     Final Clinical  Impressions(s) / ED Diagnoses   Final diagnoses:  Diverticulitis of intestine without perforation or abscess without bleeding   Labs:   Imaging:  Consults:  Therapeutics:  Discharge Meds:   Assessment/Plan:  60 year old female presents today with acute diverticulitis. Patient has significant complicated past history of diverticulitis with massive bleed requiring transfusion. Patient is hesitant on going home and requesting observation overnight. I feel this is reasonable and a high risk patient along with her comorbidities. Hospital service consult to do agree to hospitalization admission.    New Prescriptions Current Discharge Medication List       Okey Regal, PA-C 08/06/16 0140    Davonna Belling, MD 08/07/16 [phone]

## 2016-08-05 NOTE — ED Notes (Signed)
Pt in Ct  

## 2016-08-05 NOTE — ED Notes (Signed)
Back from ct.

## 2016-08-05 NOTE — Telephone Encounter (Signed)
Here ya go [employer]!

## 2016-08-05 NOTE — ED Triage Notes (Signed)
Pt from home with complaints of nausea x 3 days with no emesis. Pt has lower left abdominal pain. Pt denies diarrhea. Pt has hx of diverticulitis. Pt's LBM was yesterday morning. Pt describes the pain as cramping and throbbing and sharp and rates it at 10/10

## 2016-08-05 NOTE — H&P (Signed)
History and Physical    [name]:[id number] DOB: 12/24/1955 DOA: 08/05/2016  Referring MD/NP/PA:   PCP: Annye Asa, MD   Patient coming from:  The patient is coming from home.  At baseline, pt is independent for most of ADL.   Chief Complaint: Abdominal pain  HPI: [name] is a 60 y.o. female with medical history significant of diverticulitis, hypertension, diabetes mellitus, GERD, depression, renal cell cancer (s/p of left nephrostomy 13 years ago, no radiation and chemotherapy), IBS, CKD-II, who presents with abdominal pain.  Patient states that she has been having abdominal pain for 3 days. It is associated with nausea, but not vomiting or diarrhea. She could to No fever, but has chills. Abdominal pain is located in the left lower quadrant, constant, cramping pain, 10 out of 10 in severity, nonradiating. It is not aggravated or alleviated by any known factors. She states that she can barely eat any foods. Patient denies chest pain, shortness of breath, cough, symptoms of UTI or unilateral weakness.  ED Course: pt was found to have WBC 12.3, stable renal function, temperature normal, tachycardia, tachypnea. CT abdomen/pelvis showed acute diverticulitis of the ascending colon without perforation. Patient is placed on MedSurg bed for observation.  Review of Systems:   General: no fevers, chills, no changes in body weight, has poor appetite, has fatigue HEENT: no blurry vision, hearing changes or sore throat Respiratory: no dyspnea, coughing, wheezing CV: no chest pain, no palpitations GI: has nausea and AP. No vomiting, diarrhea, constipation GU: no dysuria, burning on urination, increased urinary frequency, hematuria  Ext: no leg edema Neuro: no unilateral weakness, numbness, or tingling, no vision change or hearing loss Skin: no rash, no skin tear. MSK: No muscle spasm, no deformity, no limitation of range of movement in spin Heme: No easy bruising.    Travel history: No recent long distant travel.  Allergy: No Known Allergies  Past Medical History:  Diagnosis Date  . Anemia   . Blood transfusion   . Blood transfusion without reported diagnosis   . CKD (chronic kidney disease) stage 2, GFR 60-89 ml/min   . Diabetes mellitus   . Diverticulosis of colon with hemorrhage 2010, 09/2011  . H/O hiatal hernia   . Helicobacter pylori gastritis 09/13/2013  . Hx: UTI (urinary tract infection)   . Hypertension   . IBS (irritable bowel syndrome)   . Personal history of colonic polyps    adenomas since 2010  . Renal cell carcinoma 2004   lt kidney removed    Past Surgical History:  Procedure Laterality Date  . ABDOMINAL HYSTERECTOMY    . CHOLECYSTECTOMY    . COLONOSCOPY  12/19/2008   diverticulosis  . COLONOSCOPY  11/17/2011   Procedure: COLONOSCOPY;  Surgeon: Zenovia Jarred, MD;  Location: [facility];  Service: Gastroenterology;  Laterality: N/A;  . DILATION AND CURETTAGE OF UTERUS N/A 07/11/2014   Procedure: Repair of Vaginal Cuff;  Surgeon: Melina Schools, MD;  Location: [facility];  Service: Gynecology;  Laterality: N/A;  . FLEXIBLE SIGMOIDOSCOPY  12/12/2008   diverticulosis  . HAND SURGERY     x5 right  . HYDRADENITIS EXCISION Right 03/21/2014   Procedure: EXCISION HIDRADENITIS AXILLA;  Surgeon: Pedro Earls, MD;  Location: [address];  Service: General;  Laterality: Right;  . KNEE ARTHROSCOPY     x2 left  . NEPHRECTOMY     left  . TRIGGER FINGER RELEASE Left 06/23/2015   Procedure: RELEASE TRIGGER FINGER/A-1 PULLEY LEFT RING FINGER;  Surgeon: Daryll Brod, MD;  Location: [facility];  Service: Orthopedics;  Laterality: Left;  . TUBAL LIGATION    . UPPER GASTROINTESTINAL ENDOSCOPY  12/12/2008   gastroporesis    Social History:  reports that she has never smoked. She has never used smokeless tobacco. She reports that she does not drink alcohol or use drugs.  Family History:  Family History  Problem  Relation Age of Onset  . Colon polyps Mother   . Diabetes Maternal Grandmother   . Breast cancer Maternal Grandmother   . Diabetes Father   . Breast cancer Daughter   . Colon cancer Neg Hx      Prior to Admission medications   Medication Sig Start Date End Date Taking? Authorizing Provider  ibuprofen (ADVIL,MOTRIN) 200 MG tablet Take 200 mg by mouth every 6 (six) hours as needed for moderate pain.   Yes Historical Provider, MD  lisinopril-hydrochlorothiazide (PRINZIDE,ZESTORETIC) 20-12.5 MG tablet Take 1 tablet by mouth daily.  07/19/16  Yes Historical Provider, MD  naproxen sodium (ANAPROX) 220 MG tablet Take 440 mg by mouth 2 (two) times daily as needed (pain).   Yes Historical Provider, MD  NOVOLOG MIX 70/30 (70-30) 100 UNIT/ML injection Inject 20-35 Units into the skin 2 (two) times daily with a meal. Inject 35 units in the am and Inject 20 units in the evening. 07/17/16  Yes Historical Provider, MD  Blood Glucose Monitoring Suppl (ONE TOUCH ULTRA MINI) W/DEVICE KIT Use to test blood sugar daily. Dx: E11.65 07/01/15   Philemon Kingdom, MD  cyclobenzaprine (FLEXERIL) 10 MG tablet Take 1 tablet (10 mg total) by mouth 3 (three) times daily as needed for muscle spasms. Patient not taking: Reported on 08/05/2016 12/01/15   Midge Minium, MD  diclofenac sodium (VOLTAREN) 1 % GEL Apply 2 g topically 4 (four) times daily. Patient not taking: Reported on 08/05/2016 12/01/15   Midge Minium, MD  dicyclomine (BENTYL) 20 MG tablet Take 1 tablet (20 mg total) by mouth every 6 (six) hours as needed for spasms (abdominal pain). Patient not taking: Reported on 08/05/2016 11/02/15   Gatha Mayer, MD  DULoxetine (CYMBALTA) 30 MG capsule Take 1 capsule (30 mg total) by mouth daily. Patient not taking: Reported on 08/05/2016 12/30/15   Midge Minium, MD  gabapentin (NEURONTIN) 300 MG capsule Take 1 capsule (300 mg total) by mouth 3 (three) times daily. Patient not taking: Reported on 08/05/2016 12/30/15    Midge Minium, MD  glipiZIDE (GLUCOTROL) 5 MG tablet TAKE ONE TABLET BY MOUTH TWICE DAILY BEFORE MEAL(S) Patient not taking: Reported on 08/05/2016 04/15/16   Elayne Snare, MD  glucose blood (ONE TOUCH ULTRA TEST) test strip Use to test blood sugar 2 times daily as instructed. Dx: E11.65 07/01/15   Philemon Kingdom, MD  hydrochlorothiazide (MICROZIDE) 12.5 MG capsule TAKE 1 CAPSULE BY MOUTH DAILY. Patient not taking: Reported on 08/05/2016 11/02/15   Midge Minium, MD  Insulin Syringe-Needle U-100 (RELION INSULIN SYR 0.5ML/31G) 31G X 5/16" 0.5 ML MISC USE ONE SYRINGE TO INJECT INSULIN TWICE DAILY AS INSTRUCTED 03/21/16   Philemon Kingdom, MD  NOVOLIN N RELION 100 UNIT/ML injection INJECT 15 UNITS SUBCUTANEOUSLY IN THE MORNING, AND 25 UNITS AT BEDTIME Patient not taking: Reported on 08/05/2016 02/01/16   Philemon Kingdom, MD  ondansetron (ZOFRAN) 4 MG tablet Take 1 tab every 6 hours as needed for nausea. Patient not taking: Reported on 08/05/2016 10/06/15   Amy S [place], PA-C  [place]  MISC Use to test blood sugar 2 times daily. Dx: E11.65 07/01/15   Philemon Kingdom, MD  ranitidine (ZANTAC) 300 MG tablet Take 1 tablet (300 mg total) by mouth at bedtime. Patient not taking: Reported on 08/05/2016 12/01/15   Midge Minium, MD  valACYclovir (VALTREX) 1000 MG tablet Take 1 tablet (1,000 mg total) by mouth 3 (three) times daily. Patient not taking: Reported on 08/05/2016 12/17/15   Midge Minium, MD    Physical Exam: Vitals:   08/05/16 2244 08/05/16 2327 08/06/16 0037 08/06/16 0043  BP: 177/95 174/89  (!) 150/69  Pulse: 103 96  99  Resp: _0 Temp: 98.7 F (37.1 C) 98.6 F (37 C)  100.2 F (37.9 C)  TempSrc: Oral Oral  Oral  SpO2: 99% 98%  98%  Weight:   103 kg (227 lb)   Height:   _1  (1.575 m)    General: Not in acute distress HEENT:       Eyes: PERRL, EOMI, no scleral icterus.       ENT: No discharge from the ears and nose, no pharynx injection, no  tonsillar enlargement.        Neck: No JVD, no bruit, no mass felt. Heme: No neck lymph node enlargement. Cardiac: S1/S2, RRR, No murmurs, No gallops or rubs. Respiratory: No rales, wheezing, rhonchi or rubs. GI: Soft, nondistended, has tender over LLQ, no rebound pain, no organomegaly, BS present. GU: No hematuria Ext: No pitting leg edema bilaterally. 2+DP/PT pulse bilaterally. Musculoskeletal: No joint deformities, No joint redness or warmth, no limitation of ROM in spin. Skin: No rashes.  Neuro: Alert, oriented X3, cranial nerves II-XII grossly intact, moves all extremities normally.  Psych: Patient is not psychotic, no suicidal or hemocidal ideation.  Labs on Admission: I have personally reviewed following labs and imaging studies  CBC:  Recent Labs Lab 08/05/16 1614 08/06/16 0432  WBC 12.3* 12.6*  HGB 12.3 11.3*  HCT 38.2 35.7*  MCV 86.8 87.3  PLT 280 423   Basic Metabolic Panel:  Recent Labs Lab 08/05/16 1614 08/06/16 0432  NA 138 136  K 3.7 3.7  CL 101 104  CO2 29 26  GLUCOSE 132* 172*  BUN 22* 16  CREATININE 1.15* 1.03*  CALCIUM 9.1 8.2*   GFR: Estimated Creatinine Clearance: 67 mL/min (by C-G formula based on SCr of 1.03 mg/dL (H)). Liver Function Tests:  Recent Labs Lab 08/05/16 1614  AST 14*  ALT 14  ALKPHOS 74  BILITOT 0.4  PROT 8.3*  ALBUMIN 3.1*    Recent Labs Lab 08/05/16 1614  LIPASE 46   No results for input(s): AMMONIA in the last 168 hours. Coagulation Profile:  Recent Labs Lab 08/06/16 0053  INR 1.09   Cardiac Enzymes: No results for input(s): CKTOTAL, CKMB, CKMBINDEX, TROPONINI in the last 168 hours. BNP (last 3 results) No results for input(s): PROBNP in the last 8760 hours. HbA1C: No results for input(s): HGBA1C in the last 72 hours. CBG: No results for input(s): GLUCAP in the last 168 hours. Lipid Profile: No results for input(s): CHOL, HDL, LDLCALC, TRIG, CHOLHDL, LDLDIRECT in the last 72 hours. Thyroid Function  Tests: No results for input(s): TSH, T4TOTAL, FREET4, T3FREE, THYROIDAB in the last 72 hours. Anemia Panel: No results for input(s): VITAMINB12, FOLATE, FERRITIN, TIBC, IRON, RETICCTPCT in the last 72 hours. Urine analysis:    Component Value Date/Time   COLORURINE YELLOW 08/05/2016 1828   APPEARANCEUR CLOUDY (A) 08/05/2016 1828   LABSPEC  1.020 08/05/2016 1828   PHURINE 5.5 08/05/2016 1828   GLUCOSEU NEGATIVE 08/05/2016 1828   GLUCOSEU NEGATIVE 07/28/2009 1005   HGBUR TRACE (A) 08/05/2016 1828   BILIRUBINUR NEGATIVE 08/05/2016 1828   BILIRUBINUR  08/20/2013 1147     Comment:     Limited sample, not performed   KETONESUR NEGATIVE 08/05/2016 1828   PROTEINUR >300 (A) 08/05/2016 1828   UROBILINOGEN 0.2 03/01/2014 1303   NITRITE NEGATIVE 08/05/2016 1828   LEUKOCYTESUR NEGATIVE 08/05/2016 1828   Sepsis Labs: _0 (procalcitonin:4,lacticidven:4) )No results found for this or any previous visit (from the past 240 hour(s)).   Radiological Exams on Admission: Ct Abdomen Pelvis W Contrast  Result Date: 08/05/2016 CLINICAL DATA:  Left pelvic pain x3 days, history of diverticulitis EXAM: CT ABDOMEN AND PELVIS WITH CONTRAST TECHNIQUE: Multidetector CT imaging of the abdomen and pelvis was performed using the standard protocol following bolus administration of intravenous contrast. CONTRAST:  100 mL Isovue 300 IV COMPARISON:  CT abdomen/pelvis dated 08/16/2013 FINDINGS: Lower chest: Lung bases are clear. Hepatobiliary: Liver is within normal limits. Status post cholecystectomy. No intrahepatic or extrahepatic ductal dilatation. Pancreas: Within normal limits. Spleen: Within normal limits. Adrenals/Urinary Tract: Adrenal glands are within normal limits. Status post left nephrectomy. Right kidney is within normal limits. No hydronephrosis. Bladder is within normal limits. Stomach/Bowel: Stomach is within normal limits. No evidence of bowel obstruction. Normal appendix (series 2/image 55).  Extensive colonic diverticulosis with inflammatory changes/diverticulitis involving the ascending colon (series 2/ image 87). No drainable fluid collection/abscess. No free air to suggest macroscopic perforation. Vascular/Lymphatic: No evidence of abdominal aortic aneurysm. No suspicious abdominopelvic lymphadenopathy. Reproductive: Status post hysterectomy. No adnexal masses. Other: No abdominopelvic ascites. Small fat containing right para midline ventral hernia (series 2/image 59). Musculoskeletal: Visualized osseous structures are within normal limits. IMPRESSION: Acute diverticulitis involving the ascending colon. No drainable fluid collection/abscess. No free air. No evidence of bowel obstruction.  Normal appendix. Status post left nephrectomy, cholecystectomy, and hysterectomy. Electronically Signed   By: Julian Hy M.D.   On: 08/05/2016 20:59     EKG: Not done in ED, will get one.   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 lactic acid is normal. Hemodynamically stable. Patient can barely eats any food.  -Patient on MedSurg bed for observation -Rocephin and Flagyl was started in ED, will continue -When necessary Zofran for nausea, morphine for pain -Follow-up blood culture -will get Procalcitonin and trend lactic acid levels per sepsis protocol. -IVF: 2.5L of NS bolus in ED, followed by 100 cc/h   HTN: -Continue Prinzide -IV hydralazine when necessary  DM-II: Last A1c 12.2 on 10/26/15, poorly controled. Patient is taking glipizide, and he makes insulin 70/30 at home -will  decrease mix the insulin dose from  20-35 units in the 10-20 units twice a day -SSI  CKD-II: stable. Baseline creatinine 1.0-1.2. Her creatinine is 1.15, BUN 22. -Follow-up renal function by BMP   DVT ppx: SQ Lovenox Code Status: Full code Family Communication: None at bed side.   Disposition Plan:  Anticipate discharge back to previous home environment Consults called:  none Admission status: medical floor/obs   Date of Service  08/06/2016    Ivor Costa [facility] Pager [phone]  If 7PM-7AM, please contact night-coverage [email or web address] Password [facility] 08/06/2016, 5:14 AM

## 2016-08-06 ENCOUNTER — Encounter (HOSPITAL_COMMUNITY): Payer: Self-pay | Admitting: Internal Medicine

## 2016-08-06 DIAGNOSIS — Z6841 Body Mass Index (BMI) 40.0 and over, adult: Secondary | ICD-10-CM | POA: Diagnosis not present

## 2016-08-06 DIAGNOSIS — R1032 Left lower quadrant pain: Secondary | ICD-10-CM | POA: Diagnosis present

## 2016-08-06 DIAGNOSIS — I129 Hypertensive chronic kidney disease with stage 1 through stage 4 chronic kidney disease, or unspecified chronic kidney disease: Secondary | ICD-10-CM | POA: Diagnosis not present

## 2016-08-06 DIAGNOSIS — E1122 Type 2 diabetes mellitus with diabetic chronic kidney disease: Secondary | ICD-10-CM | POA: Diagnosis not present

## 2016-08-06 DIAGNOSIS — Z803 Family history of malignant neoplasm of breast: Secondary | ICD-10-CM | POA: Diagnosis not present

## 2016-08-06 DIAGNOSIS — Z833 Family history of diabetes mellitus: Secondary | ICD-10-CM | POA: Diagnosis not present

## 2016-08-06 DIAGNOSIS — K5792 Diverticulitis of intestine, part unspecified, without perforation or abscess without bleeding: Secondary | ICD-10-CM | POA: Diagnosis present

## 2016-08-06 DIAGNOSIS — Z8371 Family history of colonic polyps: Secondary | ICD-10-CM | POA: Diagnosis not present

## 2016-08-06 DIAGNOSIS — A419 Sepsis, unspecified organism: Secondary | ICD-10-CM | POA: Diagnosis present

## 2016-08-06 DIAGNOSIS — Z794 Long term (current) use of insulin: Secondary | ICD-10-CM | POA: Diagnosis not present

## 2016-08-06 DIAGNOSIS — Z90710 Acquired absence of both cervix and uterus: Secondary | ICD-10-CM | POA: Diagnosis not present

## 2016-08-06 DIAGNOSIS — Z85528 Personal history of other malignant neoplasm of kidney: Secondary | ICD-10-CM | POA: Diagnosis not present

## 2016-08-06 DIAGNOSIS — Z79899 Other long term (current) drug therapy: Secondary | ICD-10-CM | POA: Diagnosis not present

## 2016-08-06 DIAGNOSIS — E11319 Type 2 diabetes mellitus with unspecified diabetic retinopathy without macular edema: Secondary | ICD-10-CM | POA: Diagnosis not present

## 2016-08-06 DIAGNOSIS — E1165 Type 2 diabetes mellitus with hyperglycemia: Secondary | ICD-10-CM | POA: Diagnosis not present

## 2016-08-06 DIAGNOSIS — Z87440 Personal history of urinary (tract) infections: Secondary | ICD-10-CM | POA: Diagnosis not present

## 2016-08-06 DIAGNOSIS — Z9049 Acquired absence of other specified parts of digestive tract: Secondary | ICD-10-CM | POA: Diagnosis not present

## 2016-08-06 DIAGNOSIS — N182 Chronic kidney disease, stage 2 (mild): Secondary | ICD-10-CM | POA: Diagnosis not present

## 2016-08-06 DIAGNOSIS — K589 Irritable bowel syndrome without diarrhea: Secondary | ICD-10-CM | POA: Diagnosis present

## 2016-08-06 DIAGNOSIS — Z86010 Personal history of colonic polyps: Secondary | ICD-10-CM | POA: Diagnosis not present

## 2016-08-06 DIAGNOSIS — Z905 Acquired absence of kidney: Secondary | ICD-10-CM | POA: Diagnosis not present

## 2016-08-06 DIAGNOSIS — K5732 Diverticulitis of large intestine without perforation or abscess without bleeding: Secondary | ICD-10-CM | POA: Diagnosis not present

## 2016-08-06 DIAGNOSIS — I10 Essential (primary) hypertension: Secondary | ICD-10-CM | POA: Diagnosis not present

## 2016-08-06 DIAGNOSIS — K219 Gastro-esophageal reflux disease without esophagitis: Secondary | ICD-10-CM | POA: Diagnosis not present

## 2016-08-06 LAB — CBC
HCT: 35.7 % — ABNORMAL LOW (ref 36.0–46.0)
HEMOGLOBIN: 11.3 g/dL — AB (ref 12.0–15.0)
MCH: 27.6 pg (ref 26.0–34.0)
MCHC: 31.7 g/dL (ref 30.0–36.0)
MCV: 87.3 fL (ref 78.0–100.0)
Platelets: 227 10*3/uL (ref 150–400)
RBC: 4.09 MIL/uL (ref 3.87–5.11)
RDW: 13.9 % (ref 11.5–15.5)
WBC: 12.6 10*3/uL — ABNORMAL HIGH (ref 4.0–10.5)

## 2016-08-06 LAB — GLUCOSE, CAPILLARY
GLUCOSE-CAPILLARY: 102 mg/dL — AB (ref 65–99)
Glucose-Capillary: 157 mg/dL — ABNORMAL HIGH (ref 65–99)
Glucose-Capillary: 88 mg/dL (ref 65–99)
Glucose-Capillary: 93 mg/dL (ref 65–99)
Glucose-Capillary: 77 mg/dL (ref 65–99)

## 2016-08-06 LAB — BASIC METABOLIC PANEL
ANION GAP: 6 (ref 5–15)
BUN: 16 mg/dL (ref 6–20)
CALCIUM: 8.2 mg/dL — AB (ref 8.9–10.3)
CO2: 26 mmol/L (ref 22–32)
CREATININE: 1.03 mg/dL — AB (ref 0.44–1.00)
Chloride: 104 mmol/L (ref 101–111)
GFR calc Af Amer: >60 mL/min (ref >60)
GFR calc non Af Amer: 59 mL/min — ABNORMAL LOW (ref >60)
GLUCOSE: 172 mg/dL — AB (ref 65–99)
Potassium: 3.7 mmol/L (ref 3.5–5.1)
Sodium: 136 mmol/L (ref 135–145)

## 2016-08-06 LAB — LACTIC ACID, PLASMA
LACTIC ACID, VENOUS: 0.8 mmol/L (ref 0.5–1.9)
LACTIC ACID, VENOUS: 1 mmol/L (ref 0.5–1.9)

## 2016-08-06 LAB — PROCALCITONIN

## 2016-08-06 LAB — APTT: aPTT: 29 seconds (ref 24–36)

## 2016-08-06 LAB — PROTIME-INR
INR: 1.09
PROTHROMBIN TIME: 14.1 s (ref 11.4–15.2)

## 2016-08-06 MED ORDER — INSULIN ASPART 100 UNIT/ML ~~LOC~~ SOLN
0.0000 [IU] | Freq: Three times a day (TID) | SUBCUTANEOUS | Status: DC
  Administered 2016-08-06: 2 [IU] via SUBCUTANEOUS
  Administered 2016-08-09 – 2016-08-11 (×6): 1 [IU] via SUBCUTANEOUS
  Filled 2016-08-06 (×5): qty 1

## 2016-08-06 MED ORDER — ACETAMINOPHEN 650 MG RE SUPP
650.0000 mg | Freq: Four times a day (QID) | RECTAL | Status: DC | PRN
Start: 2016-08-06 — End: 2016-08-11

## 2016-08-06 MED ORDER — LISINOPRIL-HYDROCHLOROTHIAZIDE 20-12.5 MG PO TABS: 1.0000 | ORAL_TABLET | Freq: Every day | ORAL | Status: DC

## 2016-08-06 MED ORDER — NAPROXEN SODIUM 275 MG PO TABS: 440.0000 mg | ORAL_TABLET | Freq: Two times a day (BID) | ORAL | Status: DC | PRN

## 2016-08-06 MED ORDER — INSULIN ASPART PROT & ASPART (70-30 MIX) 100 UNIT/ML ~~LOC~~ SUSP
20.0000 [IU] | Freq: Every day | SUBCUTANEOUS | Status: DC
  Administered 2016-08-06 – 2016-08-08 (×3): 20 [IU] via SUBCUTANEOUS
  Filled 2016-08-06: qty 10

## 2016-08-06 MED ORDER — LISINOPRIL 20 MG PO TABS
20.0000 mg | ORAL_TABLET | Freq: Every day | ORAL | Status: DC
  Administered 2016-08-06 – 2016-08-08 (×3): 20 mg via ORAL
  Filled 2016-08-06 (×3): qty 1

## 2016-08-06 MED ORDER — MORPHINE SULFATE (PF) 2 MG/ML IV SOLN
2.0000 mg | INTRAVENOUS | Status: DC | PRN
Start: 2016-08-06 — End: 2016-08-08
  Administered 2016-08-06 – 2016-08-08 (×10): 2 mg via INTRAVENOUS
  Filled 2016-08-06 (×10): qty 1

## 2016-08-06 MED ORDER — SODIUM CHLORIDE 0.9 % IV SOLN
INTRAVENOUS | Status: DC
  Administered 2016-08-06: 16:00:00 via INTRAVENOUS
  Administered 2016-08-06: 1000 mL via INTRAVENOUS
  Administered 2016-08-07 – 2016-08-11 (×5): via INTRAVENOUS

## 2016-08-06 MED ORDER — ZOLPIDEM TARTRATE 5 MG PO TABS: 5.0000 mg | ORAL_TABLET | Freq: Every evening | ORAL | Status: DC | PRN

## 2016-08-06 MED ORDER — DEXTROSE 5 % IV SOLN: 1.0000 g | INTRAVENOUS | Status: DC

## 2016-08-06 MED ORDER — DEXTROSE 5 % IV SOLN
2.0000 g | INTRAVENOUS | Status: DC
  Administered 2016-08-07 – 2016-08-11 (×5): 2 g via INTRAVENOUS
  Filled 2016-08-06 (×5): qty 2

## 2016-08-06 MED ORDER — INSULIN ASPART PROT & ASPART (70-30 MIX) 100 UNIT/ML ~~LOC~~ SUSP
10.0000 [IU] | Freq: Every day | SUBCUTANEOUS | Status: DC
  Administered 2016-08-06 – 2016-08-07 (×2): 10 [IU] via SUBCUTANEOUS
  Filled 2016-08-06: qty 10

## 2016-08-06 MED ORDER — INSULIN ASPART PROT & ASPART (70-30 MIX) 100 UNIT/ML ~~LOC~~ SUSP: 10.0000 [IU] | Freq: Two times a day (BID) | SUBCUTANEOUS | Status: DC

## 2016-08-06 MED ORDER — ONDANSETRON HCL 4 MG/2ML IJ SOLN
4.0000 mg | Freq: Three times a day (TID) | INTRAMUSCULAR | Status: DC | PRN
  Administered 2016-08-06 – 2016-08-09 (×5): 4 mg via INTRAVENOUS
  Filled 2016-08-06 (×5): qty 2

## 2016-08-06 MED ORDER — HYDRALAZINE HCL 20 MG/ML IJ SOLN
5.0000 mg | INTRAMUSCULAR | Status: DC | PRN
Start: 2016-08-06 — End: 2016-08-08

## 2016-08-06 MED ORDER — HYDROCHLOROTHIAZIDE 12.5 MG PO CAPS
12.5000 mg | ORAL_CAPSULE | Freq: Every day | ORAL | Status: DC
  Administered 2016-08-06 – 2016-08-08 (×3): 12.5 mg via ORAL
  Filled 2016-08-06 (×3): qty 1

## 2016-08-06 MED ORDER — ENOXAPARIN SODIUM 60 MG/0.6ML ~~LOC~~ SOLN
50.0000 mg | SUBCUTANEOUS | Status: DC
  Administered 2016-08-06 – 2016-08-11 (×6): 50 mg via SUBCUTANEOUS
  Filled 2016-08-06 (×6): qty 0.6

## 2016-08-06 MED ORDER — DEXTROSE 5 % IV SOLN
1.0000 g | INTRAVENOUS | Status: AC
  Administered 2016-08-06: 1 g via INTRAVENOUS
  Filled 2016-08-06: qty 10

## 2016-08-06 MED ORDER — ACETAMINOPHEN 325 MG PO TABS
650.0000 mg | ORAL_TABLET | Freq: Four times a day (QID) | ORAL | Status: DC | PRN
  Filled 2016-08-06: qty 2

## 2016-08-06 MED ORDER — METRONIDAZOLE IN NACL 5-0.79 MG/ML-% IV SOLN
500.0000 mg | Freq: Three times a day (TID) | INTRAVENOUS | Status: DC
  Administered 2016-08-06 – 2016-08-11 (×16): 500 mg via INTRAVENOUS
  Filled 2016-08-06 (×16): qty 100

## 2016-08-06 MED ORDER — SODIUM CHLORIDE 0.9 % IV BOLUS (SEPSIS)
1500.0000 mL | Freq: Once | INTRAVENOUS | Status: AC
  Administered 2016-08-06: 1500 mL via INTRAVENOUS

## 2016-08-06 MED ORDER — NAPROXEN 500 MG PO TABS
500.0000 mg | ORAL_TABLET | Freq: Two times a day (BID) | ORAL | Status: DC | PRN
  Administered 2016-08-07: 500 mg via ORAL
  Filled 2016-08-06 (×2): qty 1

## 2016-08-06 NOTE — Progress Notes (Signed)
[facility] PROGRESS NOTE  [name] [id number] DOB: 10/28/1956 DOA: 08/05/2016   PCP: Annye Asa, MD     Assessment/Plan: Principal Problem:   Diverticulitis of intestine without perforation or abscess without bleeding Active Problems:   Essential hypertension   Abdominal pain   Uncontrolled type 2 diabetes with retinopathy ([place])   CKD (chronic kidney disease) stage 2, GFR 60-89 ml/min   Sepsis ([place])   60 y.o. female with medical history significant of diverticulitis, hypertension, diabetes mellitus, GERD, depression, renal cell cancer (s/p of left nephrostomy 13 years ago, no radiation and chemotherapy), IBS, CKD-II, who presents with abdominal pain.CT abdomen/pelvis showed acute diverticulitis of the ascending colon without perforation  Assessment and plan Diverticulitis of intestine without perforation or abscess without bleeding: pt meets criteria for sepsis with leukocytosis, tachycardia and tachypnea. lactic acid is normal. Hemodynamically stable. Patient can barely eats any food.  -Patient on MedSurg bed for observation -Rocephin and Flagyl was started in ED, will continue -When necessary Zofran for nausea, morphine for pain -Follow-up blood culture -will get Procalcitonin and trend lactic acid levels per sepsis protocol. -IVF: 2.5L of NS bolus in ED, followed by 100 cc/h  Followed by [facility], colonoscopy in 2010. May need a repeat colonoscopy in a month  HTN: -Continue lisinopril, hold HCTZ -IV hydralazine when necessary  DM-II: Last A1c 12.2 on 10/26/15, poorly controled. Patient is taking glipizide, and he makes insulin 70/30 at home -will decrease mix the insulin dose from  20-35 units in the 10-20 units twice a day -SSI  CKD-II: stable. Baseline creatinine 1.0-1.2. Her creatinine is 1.15, BUN 22. -Follow-up renal function by BMP   DVT prophylaxsis Lovenox  Code Status:  Full code   Family Communication: Discussed in  detail with the patient, all imaging results, lab results explained to the patient   Disposition Plan: Anticipate discharge in 3-4 days      Consultations  None  Procedures:  None  Antibiotics: Anti-infectives    Start     Dose/Rate Route Frequency Ordered Stop   08/06/16 2359  cefTRIAXone (ROCEPHIN) 2 g in dextrose 5 % 50 mL IVPB     2 g 100 mL/hr over 30 Minutes Intravenous Every 24 hours 08/06/16 0137     08/06/16 0800  metroNIDAZOLE (FLAGYL) IVPB 500 mg     500 mg 100 mL/hr over 60 Minutes Intravenous Every 8 hours 08/06/16 0042        HPI/Subjective: Continues to have left lower quadrant pain  Objective: Vitals:   08/06/16 0037 08/06/16 0043 08/06/16 0550 08/06/16 0940  BP:  (!) 150/69 140/69 (!) 164/75  Pulse:  99 93 88  Resp:  20 18 18   Temp:  100.2 F (37.9 C) 100.1 F (37.8 C) 98.9 F (37.2 C)  TempSrc:  Oral Oral Oral  SpO2:  98% 100% 98%  Weight: 103 kg (227 lb)     Height: 5\' 2"  (1.575 m)       Intake/Output Summary (Last 24 hours) at 08/06/16 1025 Last data filed at 08/06/16 0940  Gross per 24 hour  Intake           558.33 ml  Output             1550 ml  Net          -991.67 ml    Exam:  Examination:  General exam: Appears calm and comfortable  Respiratory system: Clear to auscultation. Respiratory effort normal. Cardiovascular system: S1 &  S2 heard, RRR. No JVD, murmurs, rubs, gallops or clicks. No pedal edema. Gastrointestinal system: Abdomen is nondistended, soft and Tender in the left lower quadrant. No organomegaly or masses felt. Normal bowel sounds heard. Central nervous system: Alert and oriented. No focal neurological deficits. Extremities: Symmetric 5 x 5 power. Skin: No rashes, lesions or ulcers Psychiatry: Judgement and insight appear normal. Mood & affect appropriate.     Data Reviewed: I have personally reviewed following labs and imaging studies  Micro Results No results found for this or any previous visit (from the  past 240 hour(s)).  Radiology Reports Ct Abdomen Pelvis W Contrast  Result Date: 08/05/2016 CLINICAL DATA:  Left pelvic pain x3 days, history of diverticulitis EXAM: CT ABDOMEN AND PELVIS WITH CONTRAST TECHNIQUE: Multidetector CT imaging of the abdomen and pelvis was performed using the standard protocol following bolus administration of intravenous contrast. CONTRAST:  100 mL Isovue 300 IV COMPARISON:  CT abdomen/pelvis dated 08/16/2013 FINDINGS: Lower chest: Lung bases are clear. Hepatobiliary: Liver is within normal limits. Status post cholecystectomy. No intrahepatic or extrahepatic ductal dilatation. Pancreas: Within normal limits. Spleen: Within normal limits. Adrenals/Urinary Tract: Adrenal glands are within normal limits. Status post left nephrectomy. Right kidney is within normal limits. No hydronephrosis. Bladder is within normal limits. Stomach/Bowel: Stomach is within normal limits. No evidence of bowel obstruction. Normal appendix (series 2/image 55). Extensive colonic diverticulosis with inflammatory changes/diverticulitis involving the ascending colon (series 2/ image 69). No drainable fluid collection/abscess. No free air to suggest macroscopic perforation. Vascular/Lymphatic: No evidence of abdominal aortic aneurysm. No suspicious abdominopelvic lymphadenopathy. Reproductive: Status post hysterectomy. No adnexal masses. Other: No abdominopelvic ascites. Small fat containing right para midline ventral hernia (series 2/image 59). Musculoskeletal: Visualized osseous structures are within normal limits. IMPRESSION: Acute diverticulitis involving the ascending colon. No drainable fluid collection/abscess. No free air. No evidence of bowel obstruction.  Normal appendix. Status post left nephrectomy, cholecystectomy, and hysterectomy. Electronically Signed   By: Julian Hy M.D.   On: 08/05/2016 20:59     CBC  Recent Labs Lab 08/05/16 1614 08/06/16 0432  WBC 12.3* 12.6*  HGB 12.3 11.3*   HCT 38.2 35.7*  PLT 280 227  MCV 86.8 87.3  MCH 28.0 27.6  MCHC 32.2 31.7  RDW 13.8 13.9    Chemistries   Recent Labs Lab 08/05/16 1614 08/06/16 0432  NA 138 136  K 3.7 3.7  CL 101 104  CO2 29 26  GLUCOSE 132* 172*  BUN 22* 16  CREATININE 1.15* 1.03*  CALCIUM 9.1 8.2*  AST 14*  --   ALT 14  --   ALKPHOS 74  --   BILITOT 0.4  --    ------------------------------------------------------------------------------------------------------------------ estimated creatinine clearance is 67 mL/min (by C-G formula based on SCr of 1.03 mg/dL (H)). ------------------------------------------------------------------------------------------------------------------ No results for input(s): HGBA1C in the last 72 hours. ------------------------------------------------------------------------------------------------------------------ No results for input(s): CHOL, HDL, LDLCALC, TRIG, CHOLHDL, LDLDIRECT in the last 72 hours. ------------------------------------------------------------------------------------------------------------------ No results for input(s): TSH, T4TOTAL, T3FREE, THYROIDAB in the last 72 hours.  Invalid input(s): FREET3 ------------------------------------------------------------------------------------------------------------------ No results for input(s): VITAMINB12, FOLATE, FERRITIN, TIBC, IRON, RETICCTPCT in the last 72 hours.  Coagulation profile  Recent Labs Lab 08/06/16 0053  INR 1.09    No results for input(s): DDIMER in the last 72 hours.  Cardiac Enzymes No results for input(s): CKMB, TROPONINI, MYOGLOBIN in the last 168 hours.  Invalid input(s): CK ------------------------------------------------------------------------------------------------------------------ Invalid input(s): POCBNP   CBG:  Recent Labs Lab 08/06/16 0801  GLUCAP 157*  Studies: Ct Abdomen Pelvis W Contrast  Result Date: 08/05/2016 CLINICAL DATA:  Left pelvic  pain x3 days, history of diverticulitis EXAM: CT ABDOMEN AND PELVIS WITH CONTRAST TECHNIQUE: Multidetector CT imaging of the abdomen and pelvis was performed using the standard protocol following bolus administration of intravenous contrast. CONTRAST:  100 mL Isovue 300 IV COMPARISON:  CT abdomen/pelvis dated 08/16/2013 FINDINGS: Lower chest: Lung bases are clear. Hepatobiliary: Liver is within normal limits. Status post cholecystectomy. No intrahepatic or extrahepatic ductal dilatation. Pancreas: Within normal limits. Spleen: Within normal limits. Adrenals/Urinary Tract: Adrenal glands are within normal limits. Status post left nephrectomy. Right kidney is within normal limits. No hydronephrosis. Bladder is within normal limits. Stomach/Bowel: Stomach is within normal limits. No evidence of bowel obstruction. Normal appendix (series 2/image 55). Extensive colonic diverticulosis with inflammatory changes/diverticulitis involving the ascending colon (series 2/ image 43). No drainable fluid collection/abscess. No free air to suggest macroscopic perforation. Vascular/Lymphatic: No evidence of abdominal aortic aneurysm. No suspicious abdominopelvic lymphadenopathy. Reproductive: Status post hysterectomy. No adnexal masses. Other: No abdominopelvic ascites. Small fat containing right para midline ventral hernia (series 2/image 59). Musculoskeletal: Visualized osseous structures are within normal limits. IMPRESSION: Acute diverticulitis involving the ascending colon. No drainable fluid collection/abscess. No free air. No evidence of bowel obstruction.  Normal appendix. Status post left nephrectomy, cholecystectomy, and hysterectomy. Electronically Signed   By: Julian Hy M.D.   On: 08/05/2016 20:59      Lab Results  Component Value Date   HGBA1C 12.2 (H) 10/26/2015   HGBA1C 14.1 06/16/2015   HGBA1C 12.5 (H) 08/29/2014   Lab Results  Component Value Date   MICROALBUR protein in urine 08/06/2015    LDLCALC 122 (H) 10/26/2015   CREATININE 1.03 (H) 08/06/2016       Scheduled Meds: . cefTRIAXone (ROCEPHIN)  IV  2 g Intravenous Q24H  . enoxaparin (LOVENOX) injection  50 mg Subcutaneous Q24H  . lisinopril  20 mg Oral Daily   And  . hydrochlorothiazide  12.5 mg Oral Daily  . insulin aspart  0-9 Units Subcutaneous TID WC  . insulin aspart protamine- aspart  10 Units Subcutaneous Q supper  . insulin aspart protamine- aspart  20 Units Subcutaneous Q breakfast  . metronidazole  500 mg Intravenous Q8H   Continuous Infusions: . sodium chloride 1,000 mL (08/06/16 0140)     LOS: 0 days    Time spent: >30 MINS    [facility]  [facility] Pager [phone]. If 7PM-7AM, please contact night-coverage at [email or web address], password [facility] 08/06/2016, 10:25 AM  LOS: 0 days

## 2016-08-07 DIAGNOSIS — E11319 Type 2 diabetes mellitus with unspecified diabetic retinopathy without macular edema: Secondary | ICD-10-CM

## 2016-08-07 DIAGNOSIS — E1165 Type 2 diabetes mellitus with hyperglycemia: Secondary | ICD-10-CM

## 2016-08-07 LAB — CBC
HEMATOCRIT: 32.8 % — AB (ref 36.0–46.0)
HEMOGLOBIN: 10.6 g/dL — AB (ref 12.0–15.0)
MCH: 27.5 pg (ref 26.0–34.0)
MCHC: 32.3 g/dL (ref 30.0–36.0)
MCV: 85 fL (ref 78.0–100.0)
Platelets: 239 10*3/uL (ref 150–400)
RBC: 3.86 MIL/uL — AB (ref 3.87–5.11)
RDW: 13.6 % (ref 11.5–15.5)
WBC: 11 10*3/uL — ABNORMAL HIGH (ref 4.0–10.5)

## 2016-08-07 LAB — COMPREHENSIVE METABOLIC PANEL
ALT: 12 U/L — AB (ref 14–54)
ANION GAP: 8 (ref 5–15)
AST: 14 U/L — ABNORMAL LOW (ref 15–41)
Albumin: 2.6 g/dL — ABNORMAL LOW (ref 3.5–5.0)
Alkaline Phosphatase: 56 U/L (ref 38–126)
BUN: 11 mg/dL (ref 6–20)
CHLORIDE: 102 mmol/L (ref 101–111)
CO2: 27 mmol/L (ref 22–32)
CREATININE: 0.93 mg/dL (ref 0.44–1.00)
Calcium: 8.4 mg/dL — ABNORMAL LOW (ref 8.9–10.3)
GFR calc non Af Amer: >60 mL/min (ref >60)
Glucose, Bld: 83 mg/dL (ref 65–99)
POTASSIUM: 3.4 mmol/L — AB (ref 3.5–5.1)
SODIUM: 137 mmol/L (ref 135–145)
Total Bilirubin: 0.3 mg/dL (ref 0.3–1.2)
Total Protein: 7 g/dL (ref 6.5–8.1)

## 2016-08-07 LAB — GLUCOSE, CAPILLARY
GLUCOSE-CAPILLARY: 77 mg/dL (ref 65–99)
GLUCOSE-CAPILLARY: 77 mg/dL (ref 65–99)
GLUCOSE-CAPILLARY: 78 mg/dL (ref 65–99)
GLUCOSE-CAPILLARY: 56 mg/dL — AB (ref 65–99)
GLUCOSE-CAPILLARY: 87 mg/dL (ref 65–99)

## 2016-08-07 MED ORDER — OXYCODONE HCL 5 MG PO TABS
5.0000 mg | ORAL_TABLET | ORAL | Status: DC | PRN
  Administered 2016-08-07 – 2016-08-09 (×7): 5 mg via ORAL
  Filled 2016-08-07 (×8): qty 1

## 2016-08-07 MED ORDER — POTASSIUM CHLORIDE CRYS ER 20 MEQ PO TBCR
40.0000 meq | EXTENDED_RELEASE_TABLET | Freq: Once | ORAL | Status: AC
  Administered 2016-08-07: 40 meq via ORAL
  Filled 2016-08-07: qty 2

## 2016-08-07 NOTE — Progress Notes (Signed)
Dr. Eliseo Squires aware via text and phone of pt ongoing, severe pain in LLQ and pt's request to see her GI Md, Dr. Carlean Purl. Recently received Naproxyn. No new orders received yet MD reported will be in to see pt soon.

## 2016-08-07 NOTE — Progress Notes (Signed)
PROGRESS NOTE    [name]  [id number] DOB: 1956-06-26 DOA: 08/05/2016 PCP: Annye Asa, MD   Outpatient Specialists:     Brief Narrative:  [name] is a 60 y.o. female with medical history significant of diverticulitis, hypertension, diabetes mellitus, GERD, depression, renal cell cancer (s/p of left nephrostomy 13 years ago, no radiation and chemotherapy), IBS, CKD-II, who presents with abdominal pain.  Patient states that she has been having abdominal pain for 3 days. It is associated with nausea, but not vomiting or diarrhea. She could to No fever, but has chills. Abdominal pain is located in the left lower quadrant, constant, cramping pain, 10 out of 10 in severity, nonradiating. It is not aggravated or alleviated by any known factors. She states that she can barely eat any foods. Patient denies chest pain, shortness of breath, cough, symptoms of UTI or unilateral weakness.    Assessment & Plan:   Principal Problem:   Diverticulitis of intestine without perforation or abscess without bleeding Active Problems:   Essential hypertension   Abdominal pain   Uncontrolled type 2 diabetes with retinopathy ([facility])   CKD (chronic kidney disease) stage 2, GFR 60-89 ml/min   Sepsis ([place])   Diverticulitis   Diverticulitis of intestine without perforation or abscess without bleeding: pt meets criteria for sepsis with leukocytosis, tachycardia and tachypnea -Rocephin and Flagyl was started in ED, will continue -alternate morphine/oxycodone for pain -Follow-up blood culture -gentle IVF   HTN: -Continue Prinzide -IV hydralazine when necessary  DM-II: Last A1c 12.2 on 10/26/15, poorly controled. -lower dose of 70/30 -SSI -on clears  CKD-II: stable. Baseline creatinine 1.0-1.2. Her creatinine is 1.15, BUN 22. -Follow-up renal function by BMP    DVT prophylaxis:  Lovenox   Code Status: Full Code   Family  Communication: patient  Disposition Plan:     Consultants:        Subjective: Having LLQ pain -up walking around  Objective: Vitals:   08/06/16 0940 08/06/16 1500 08/06/16 2212 08/07/16 0620  BP: (!) 164/75 (!) 166/76 140/62 (!) 161/74  Pulse: 88 92 85 80  Resp: 18 18 18 18   Temp: 98.9 F (37.2 C) (!) 100.4 F (38 C) 99.4 F (37.4 C) 98.8 F (37.1 C)  TempSrc: Oral Oral Oral Oral  SpO2: 98% 96% 100% 95%  Weight:      Height:        Intake/Output Summary (Last 24 hours) at 08/07/16 1237 Last data filed at 08/07/16 1000  Gross per 24 hour  Intake             3090 ml  Output             1800 ml  Net             1290 ml   Filed Weights   08/06/16 0037  Weight: 103 kg (227 lb)    Examination:  General exam: Appears calm and comfortable  Respiratory system: Clear to auscultation. Respiratory effort normal. Cardiovascular system: S1 & S2 heard, RRR. No JVD, murmurs, rubs, gallops or clicks. No pedal edema. Gastrointestinal system: pain in left lower quadrant Central nervous system: Alert and oriented. No focal neurological deficits. Extremities: Symmetric 5 x 5 power.     Data Reviewed: I have personally reviewed following labs and imaging studies  CBC:  Recent Labs Lab 08/05/16 1614 08/06/16 0432 08/07/16 0413  WBC 12.3* 12.6* 11.0*  HGB 12.3 11.3* 10.6*  HCT 38.2 35.7* 32.8*  MCV 86.8 87.3 85.0  PLT  280 227 [id number]   Basic Metabolic Panel:  Recent Labs Lab 08/05/16 1614 08/06/16 0432 08/07/16 0413  NA 138 136 137  K 3.7 3.7 3.4*  CL 101 104 102  CO2 29 26 27   GLUCOSE 132* 172* 83  BUN 22* 16 11  CREATININE 1.15* 1.03* 0.93  CALCIUM 9.1 8.2* 8.4*   GFR: Estimated Creatinine Clearance: 74.2 mL/min (by C-G formula based on SCr of 0.93 mg/dL). Liver Function Tests:  Recent Labs Lab 08/05/16 1614 08/07/16 0413  AST 14* 14*  ALT 14 12*  ALKPHOS 74 56  BILITOT 0.4 0.3  PROT 8.3* 7.0  ALBUMIN 3.1* 2.6*    Recent Labs Lab  08/05/16 1614  LIPASE 46   No results for input(s): AMMONIA in the last 168 hours. Coagulation Profile:  Recent Labs Lab 08/06/16 0053  INR 1.09   Cardiac Enzymes: No results for input(s): CKTOTAL, CKMB, CKMBINDEX, TROPONINI in the last 168 hours. BNP (last 3 results) No results for input(s): PROBNP in the last 8760 hours. HbA1C: No results for input(s): HGBA1C in the last 72 hours. CBG:  Recent Labs Lab 08/06/16 1659 08/06/16 1849 08/06/16 2304 08/07/16 0747 08/07/16 1130  GLUCAP 88 93 77 77 77   Lipid Profile: No results for input(s): CHOL, HDL, LDLCALC, TRIG, CHOLHDL, LDLDIRECT in the last 72 hours. Thyroid Function Tests: No results for input(s): TSH, T4TOTAL, FREET4, T3FREE, THYROIDAB in the last 72 hours. Anemia Panel: No results for input(s): VITAMINB12, FOLATE, FERRITIN, TIBC, IRON, RETICCTPCT in the last 72 hours. Urine analysis:    Component Value Date/Time   COLORURINE YELLOW 08/05/2016 1828   APPEARANCEUR CLOUDY (A) 08/05/2016 1828   LABSPEC 1.020 08/05/2016 1828   PHURINE 5.5 08/05/2016 1828   GLUCOSEU NEGATIVE 08/05/2016 1828   GLUCOSEU NEGATIVE 07/28/2009 1005   HGBUR TRACE (A) 08/05/2016 1828   BILIRUBINUR NEGATIVE 08/05/2016 1828   BILIRUBINUR  08/20/2013 1147     Comment:     Limited sample, not performed   KETONESUR NEGATIVE 08/05/2016 1828   PROTEINUR >300 (A) 08/05/2016 1828   UROBILINOGEN 0.2 03/01/2014 1303   NITRITE NEGATIVE 08/05/2016 1828   LEUKOCYTESUR NEGATIVE 08/05/2016 1828     )No results found for this or any previous visit (from the past 240 hour(s)).    Anti-infectives    Start     Dose/Rate Route Frequency Ordered Stop   08/06/16 2359  cefTRIAXone (ROCEPHIN) 2 g in dextrose 5 % 50 mL IVPB     2 g 100 mL/hr over 30 Minutes Intravenous Every 24 hours 08/06/16 0137     08/06/16 0800  metroNIDAZOLE (FLAGYL) IVPB 500 mg     500 mg 100 mL/hr over 60 Minutes Intravenous Every 8 hours 08/06/16 0042     08/06/16 0145   cefTRIAXone (ROCEPHIN) 1 g in dextrose 5 % 50 mL IVPB     1 g 100 mL/hr over 30 Minutes Intravenous STAT 08/06/16 0138 08/06/16 0237   08/06/16 0045  cefTRIAXone (ROCEPHIN) 1 g in dextrose 5 % 50 mL IVPB  Status:  Discontinued     1 g 100 mL/hr over 30 Minutes Intravenous Every 24 hours 08/06/16 0042 08/06/16 0136   08/05/16 2315  cefTRIAXone (ROCEPHIN) 1 g in dextrose 5 % 50 mL IVPB     1 g 100 mL/hr over 30 Minutes Intravenous  Once 08/05/16 2304 08/06/16 0005   08/05/16 2315  metroNIDAZOLE (FLAGYL) IVPB 500 mg     500 mg 100 mL/hr over 60 Minutes Intravenous  Once  08/05/16 2304 08/06/16 0105       Radiology Studies: Ct Abdomen Pelvis W Contrast  Result Date: 08/05/2016 CLINICAL DATA:  Left pelvic pain x3 days, history of diverticulitis EXAM: CT ABDOMEN AND PELVIS WITH CONTRAST TECHNIQUE: Multidetector CT imaging of the abdomen and pelvis was performed using the standard protocol following bolus administration of intravenous contrast. CONTRAST:  100 mL Isovue 300 IV COMPARISON:  CT abdomen/pelvis dated 08/16/2013 FINDINGS: Lower chest: Lung bases are clear. Hepatobiliary: Liver is within normal limits. Status post cholecystectomy. No intrahepatic or extrahepatic ductal dilatation. Pancreas: Within normal limits. Spleen: Within normal limits. Adrenals/Urinary Tract: Adrenal glands are within normal limits. Status post left nephrectomy. Right kidney is within normal limits. No hydronephrosis. Bladder is within normal limits. Stomach/Bowel: Stomach is within normal limits. No evidence of bowel obstruction. Normal appendix (series 2/image 55). Extensive colonic diverticulosis with inflammatory changes/diverticulitis involving the ascending colon (series 2/ image 46). No drainable fluid collection/abscess. No free air to suggest macroscopic perforation. Vascular/Lymphatic: No evidence of abdominal aortic aneurysm. No suspicious abdominopelvic lymphadenopathy. Reproductive: Status post hysterectomy.  No adnexal masses. Other: No abdominopelvic ascites. Small fat containing right para midline ventral hernia (series 2/image 59). Musculoskeletal: Visualized osseous structures are within normal limits. IMPRESSION: Acute diverticulitis involving the ascending colon. No drainable fluid collection/abscess. No free air. No evidence of bowel obstruction.  Normal appendix. Status post left nephrectomy, cholecystectomy, and hysterectomy. Electronically Signed   By: Julian Hy M.D.   On: 08/05/2016 20:59        Scheduled Meds: . cefTRIAXone (ROCEPHIN)  IV  2 g Intravenous Q24H  . enoxaparin (LOVENOX) injection  50 mg Subcutaneous Q24H  . lisinopril  20 mg Oral Daily   And  . hydrochlorothiazide  12.5 mg Oral Daily  . insulin aspart  0-9 Units Subcutaneous TID WC  . insulin aspart protamine- aspart  10 Units Subcutaneous Q supper  . insulin aspart protamine- aspart  20 Units Subcutaneous Q breakfast  . metronidazole  500 mg Intravenous Q8H   Continuous Infusions: . sodium chloride 100 mL/hr at 08/07/16 0354     LOS: 1 day    Time spent: 25 min    [place], DO [facility] Pager [phone]  If 7PM-7AM, please contact night-coverage [email or web address] Password [facility] [place] 08/07/2016, 12:37 PM

## 2016-08-08 LAB — GLUCOSE, CAPILLARY
GLUCOSE-CAPILLARY: 93 mg/dL (ref 65–99)
Glucose-Capillary: 94 mg/dL (ref 65–99)
Glucose-Capillary: 106 mg/dL — ABNORMAL HIGH (ref 65–99)
Glucose-Capillary: 55 mg/dL — ABNORMAL LOW (ref 65–99)
Glucose-Capillary: 80 mg/dL (ref 65–99)
Glucose-Capillary: 82 mg/dL (ref 65–99)

## 2016-08-08 LAB — BASIC METABOLIC PANEL
Anion gap: 9 (ref 5–15)
BUN: 10 mg/dL (ref 6–20)
CHLORIDE: 100 mmol/L — AB (ref 101–111)
CO2: 27 mmol/L (ref 22–32)
CREATININE: 0.94 mg/dL (ref 0.44–1.00)
Calcium: 8.6 mg/dL — ABNORMAL LOW (ref 8.9–10.3)
GFR calc Af Amer: >60 mL/min (ref >60)
GFR calc non Af Amer: >60 mL/min (ref >60)
Glucose, Bld: 88 mg/dL (ref 65–99)
Potassium: 3.7 mmol/L (ref 3.5–5.1)
SODIUM: 136 mmol/L (ref 135–145)

## 2016-08-08 LAB — CBC
HEMATOCRIT: 35.9 % — AB (ref 36.0–46.0)
HEMOGLOBIN: 11.5 g/dL — AB (ref 12.0–15.0)
MCH: 27.6 pg (ref 26.0–34.0)
MCHC: 32 g/dL (ref 30.0–36.0)
MCV: 86.3 fL (ref 78.0–100.0)
Platelets: 270 10*3/uL (ref 150–400)
RBC: 4.16 MIL/uL (ref 3.87–5.11)
RDW: 13.5 % (ref 11.5–15.5)
WBC: 8.4 10*3/uL (ref 4.0–10.5)

## 2016-08-08 MED ORDER — MORPHINE SULFATE (PF) 2 MG/ML IV SOLN
2.0000 mg | INTRAVENOUS | Status: DC | PRN
  Administered 2016-08-08 – 2016-08-10 (×6): 2 mg via INTRAVENOUS
  Filled 2016-08-08 (×9): qty 1

## 2016-08-08 MED ORDER — DOCUSATE SODIUM 100 MG PO CAPS
100.0000 mg | ORAL_CAPSULE | Freq: Two times a day (BID) | ORAL | Status: DC
  Administered 2016-08-08 – 2016-08-10 (×4): 100 mg via ORAL
  Filled 2016-08-08 (×4): qty 1

## 2016-08-08 MED ORDER — INSULIN ASPART PROT & ASPART (70-30 MIX) 100 UNIT/ML ~~LOC~~ SUSP
7.0000 [IU] | Freq: Every day | SUBCUTANEOUS | Status: DC
  Administered 2016-08-09 – 2016-08-10 (×2): 7 [IU] via SUBCUTANEOUS

## 2016-08-08 MED ORDER — INSULIN ASPART PROT & ASPART (70-30 MIX) 100 UNIT/ML ~~LOC~~ SUSP
15.0000 [IU] | Freq: Every day | SUBCUTANEOUS | Status: DC
  Administered 2016-08-09 – 2016-08-11 (×3): 15 [IU] via SUBCUTANEOUS

## 2016-08-08 MED ORDER — HYDRALAZINE HCL 20 MG/ML IJ SOLN
10.0000 mg | INTRAMUSCULAR | Status: DC | PRN
  Administered 2016-08-09 (×2): 10 mg via INTRAVENOUS
  Filled 2016-08-08 (×3): qty 1

## 2016-08-08 NOTE — Progress Notes (Signed)
Inpatient Diabetes Program Recommendations  AACE/ADA: New Consensus Statement on Inpatient Glycemic Control (2015)  Target Ranges:  Prepandial:   less than 140 mg/dL      Peak postprandial:   less than 180 mg/dL (1-2 hours)      Critically ill patients:  140 - 180 mg/dL   Results for [name], [name] (MRN [id number]) as of 08/08/2016 11:14  Ref. Range 08/07/2016 07:47 08/07/2016 11:30 08/07/2016 17:23 08/07/2016 21:26 08/07/2016 22:49  Glucose-Capillary Latest Ref Range: 65 - 99 mg/dL 77 77 78 56 (L) 87    Results for [name], [name] (MRN [id number]) as of 08/08/2016 11:14  Ref. Range 08/08/2016 06:05  Glucose-Capillary Latest Ref Range: 65 - 99 mg/dL 94    Admit with: Abd Pain/ Diverticulitis  History: DM, CKD  Home DM Meds: 70/30 Insulin- 35 units AM/ 20 units PM       Per records, patient no longer taking Glipizide at home  Current Insulin Orders: 70/30 Insulin- 20 units AM/ 10 units PM        Novolog Sensitive Correction Scale/ SSI (0-9 units) TID AC      MD- Patient with borderline low CBGs all day yesterday (09/24).  Hypoglycemic event at bedtime (CBG 56 mg/dl).  Patient currently only getting CL diet.  Please consider reducing 70/30 Insulin to 15 units AM/ 7 units PM      --Will follow patient during hospitalization--  Wyn Quaker RN, MSN, CDE Diabetes Coordinator Inpatient Glycemic Control Team Team Pager: [phone] (8a-5p)

## 2016-08-08 NOTE — Progress Notes (Signed)
PROGRESS NOTE    [name]  [id number] DOB: 31-Mar-1956 DOA: 08/05/2016 PCP: Annye Asa, MD   Outpatient Specialists:     Brief Narrative:  [name] is a 60 y.o. female with medical history significant of diverticulitis, hypertension, diabetes mellitus, GERD, depression, renal cell cancer (s/p of left nephrostomy 13 years ago, no radiation and chemotherapy), IBS, CKD-II, who presents with abdominal pain.  Patient states that she has been having abdominal pain for 3 days. It is associated with nausea, but not vomiting or diarrhea. She could to No fever, but has chills. Abdominal pain is located in the left lower quadrant, constant, cramping pain, 10 out of 10 in severity, nonradiating. It is not aggravated or alleviated by any known factors. She states that she can barely eat any foods. 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 -lower dose of 70/30 -SSI -on clears  CKD-II: stable. Baseline creatinine 1.0-1.2. Her creatinine is 1.15, BUN 22. -Follow-up renal function by BMP    DVT prophylaxis:  Lovenox   Code Status: Full Code   Family Communication: patient  Disposition Plan:     Consultants:        Subjective: Still having  pain  Objective: Vitals:   08/07/16 0620 08/07/16 1351 08/07/16 2127 08/08/16 0557  BP: (!) 161/74 (!) 146/63 (!) 176/77 (!) 191/84  Pulse: 80 76 84 79  Resp: 18 16 16 16   Temp: 98.8 F (37.1 C) 98.5 F (36.9 C) 98.5 F (36.9 C) 99 F (37.2 C)  TempSrc: Oral Oral Oral Oral  SpO2: 95% 100% 97% 100%  Weight:      Height:        Intake/Output Summary (Last 24 hours) at 08/08/16 1222 Last data filed at 08/08/16 1024  Gross per 24 hour  Intake             2070 ml  Output             3350 ml  Net            -1280 ml   Filed Weights   08/06/16 0037  Weight: 103 kg (227 lb)    Examination:  General exam: Appears calm and comfortable  Respiratory system: Clear to auscultation. Respiratory effort normal. Cardiovascular system: S1 & S2 heard, RRR. No JVD, murmurs, rubs, gallops or clicks. No pedal edema. Gastrointestinal system: pain in left lower quadrant, +BS Central nervous system: Alert and oriented. No focal neurological deficits. Extremities: Symmetric 5 x 5 power.     Data Reviewed: I have personally reviewed following labs and imaging studies  CBC:  Recent Labs Lab 08/05/16 1614 08/06/16 0432 08/07/16 0413 08/08/16 0414  WBC 12.3* 12.6* 11.0* 8.4  HGB 12.3 11.3* 10.6* 11.5*  HCT 38.2 35.7* 32.8* 35.9*  MCV 86.8 87.3 85.0 86.3  PLT 280 227 239 [id number]   Basic Metabolic Panel:  Recent Labs Lab 08/05/16 1614 08/06/16 0432 08/07/16 0413 08/08/16 0414  NA 138 136 137 136  K 3.7 3.7 3.4* 3.7  CL 101 104 102 100*  CO2 29 26 27 27   GLUCOSE 132* 172* 83 88  BUN 22* 16 11 10   CREATININE 1.15* 1.03* 0.93 0.94  CALCIUM 9.1 8.2* 8.4* 8.6*   GFR: Estimated Creatinine Clearance: 73.4 mL/min (by C-G formula based on SCr of 0.94 mg/dL). Liver Function Tests:  Recent Labs Lab 08/05/16 1614 08/07/16 0413  AST 14* 14*  ALT 14 12*  ALKPHOS 74 56  BILITOT 0.4 0.3  PROT 8.3* 7.0  ALBUMIN 3.1* 2.6*    Recent Labs Lab 08/05/16 1614  LIPASE 46   No  results for input(s): AMMONIA in the last 168 hours. Coagulation Profile:  Recent Labs Lab 08/06/16 0053  INR 1.09   Cardiac Enzymes: No results for input(s): CKTOTAL, CKMB, CKMBINDEX, TROPONINI in the last 168 hours. BNP (last 3 results) No results for input(s): PROBNP in the last 8760 hours. HbA1C: No results for input(s): HGBA1C in the last 72 hours. CBG:  Recent Labs Lab 08/07/16 2126 08/07/16 2249 08/08/16 0605 08/08/16 0747 08/08/16 1217  GLUCAP 56* 87 94 106* 55*   Lipid Profile: No results for input(s): CHOL, HDL, LDLCALC, TRIG, CHOLHDL, LDLDIRECT in the last 72 hours. Thyroid Function Tests: No results for input(s): TSH, T4TOTAL, FREET4, T3FREE, THYROIDAB in the last 72 hours. Anemia Panel: No results for input(s): VITAMINB12, FOLATE, FERRITIN, TIBC, IRON, RETICCTPCT in the last 72 hours. Urine analysis:    Component Value Date/Time   COLORURINE YELLOW 08/05/2016 1828   APPEARANCEUR CLOUDY (A) 08/05/2016 1828   LABSPEC 1.020 08/05/2016 1828   PHURINE 5.5 08/05/2016 1828   GLUCOSEU NEGATIVE 08/05/2016 1828   GLUCOSEU NEGATIVE 07/28/2009 1005   HGBUR TRACE (A) 08/05/2016 1828   BILIRUBINUR NEGATIVE 08/05/2016 1828   BILIRUBINUR  08/20/2013 1147     Comment:     Limited sample, not performed   KETONESUR NEGATIVE 08/05/2016 1828   PROTEINUR >300 (A) 08/05/2016 1828   UROBILINOGEN 0.2 03/01/2014 1303   NITRITE NEGATIVE 08/05/2016 1828   LEUKOCYTESUR NEGATIVE 08/05/2016 1828     ) Recent Results (from the past 240 hour(s))  Culture, blood (x 2)     Status: None (Preliminary result)   Collection Time: 08/06/16 12:53 AM  Result Value Ref Range Status   Specimen Description BLOOD RIGHT ANTECUBITAL  Final   Special Requests IN PEDIATRIC BOTTLE 1ML  Final   Culture   Final    NO GROWTH 1 DAY Performed at [facility]    Report Status PENDING  Incomplete  Culture, blood (x 2)     Status: None (Preliminary result)   Collection Time: 08/06/16 12:53  AM  Result Value Ref Range Status   Specimen Description BLOOD RIGHT HAND  Final   Special Requests IN PEDIATRIC BOTTLE 1ML  Final   Culture   Final    NO GROWTH 1 DAY Performed at [facility]    Report Status PENDING  Incomplete      Anti-infectives    Start     Dose/Rate Route Frequency Ordered Stop   08/06/16 2359  cefTRIAXone (ROCEPHIN) 2 g in dextrose 5 % 50 mL IVPB     2 g 100 mL/hr over 30 Minutes Intravenous Every 24 hours 08/06/16 0137     08/06/16 0800  metroNIDAZOLE (FLAGYL) IVPB 500 mg     500 mg 100 mL/hr over 60  Minutes Intravenous Every 8 hours 08/06/16 0042     08/06/16 0145  cefTRIAXone (ROCEPHIN) 1 g in dextrose 5 % 50 mL IVPB     1 g 100 mL/hr over 30 Minutes Intravenous STAT 08/06/16 0138 08/06/16 0237   08/06/16 0045  cefTRIAXone (ROCEPHIN) 1 g in dextrose 5 % 50 mL IVPB  Status:  Discontinued     1 g 100 mL/hr over 30 Minutes Intravenous Every 24 hours 08/06/16 0042 08/06/16 0136   08/05/16 2315  cefTRIAXone (ROCEPHIN) 1 g in dextrose 5 % 50 mL IVPB     1 g 100 mL/hr over 30 Minutes Intravenous  Once 08/05/16 2304 08/06/16 0005   08/05/16 2315  metroNIDAZOLE (FLAGYL) IVPB 500 mg     500 mg 100 mL/hr over 60 Minutes Intravenous  Once 08/05/16 2304 08/06/16 0105       Radiology Studies: No results found.      Scheduled Meds: . cefTRIAXone (ROCEPHIN)  IV  2 g Intravenous Q24H  . enoxaparin (LOVENOX) injection  50 mg Subcutaneous Q24H  . lisinopril  20 mg Oral Daily   And  . hydrochlorothiazide  12.5 mg Oral Daily  . insulin aspart  0-9 Units Subcutaneous TID WC  . [START ON 08/09/2016] insulin aspart protamine- aspart  15 Units Subcutaneous Q breakfast  . insulin aspart protamine- aspart  7 Units Subcutaneous Q supper  . metronidazole  500 mg Intravenous Q8H   Continuous Infusions: . sodium chloride 100 mL/hr at 08/08/16 0052     LOS: 2 days    Time spent: 25 min    Sarah Ann, DO [facility] Pager  [phone]  If 7PM-7AM, please contact night-coverage [email or web address] Password [employer] 08/08/2016, 12:22 PM

## 2016-08-09 LAB — BASIC METABOLIC PANEL
Anion gap: 12 (ref 5–15)
BUN: 10 mg/dL (ref 6–20)
CO2: 24 mmol/L (ref 22–32)
CREATININE: 0.97 mg/dL (ref 0.44–1.00)
Calcium: 8.7 mg/dL — ABNORMAL LOW (ref 8.9–10.3)
Chloride: 98 mmol/L — ABNORMAL LOW (ref 101–111)
GFR calc Af Amer: >60 mL/min (ref >60)
Glucose, Bld: 116 mg/dL — ABNORMAL HIGH (ref 65–99)
Potassium: 3.5 mmol/L (ref 3.5–5.1)
SODIUM: 134 mmol/L — AB (ref 135–145)

## 2016-08-09 LAB — CBC
HCT: 37.8 % (ref 36.0–46.0)
Hemoglobin: 12.3 g/dL (ref 12.0–15.0)
MCH: 27.9 pg (ref 26.0–34.0)
MCHC: 32.5 g/dL (ref 30.0–36.0)
MCV: 85.7 fL (ref 78.0–100.0)
PLATELETS: 300 10*3/uL (ref 150–400)
RBC: 4.41 MIL/uL (ref 3.87–5.11)
RDW: 13.5 % (ref 11.5–15.5)
WBC: 7.8 10*3/uL (ref 4.0–10.5)

## 2016-08-09 LAB — GLUCOSE, CAPILLARY
GLUCOSE-CAPILLARY: 135 mg/dL — AB (ref 65–99)
Glucose-Capillary: 112 mg/dL — ABNORMAL HIGH (ref 65–99)
Glucose-Capillary: 124 mg/dL — ABNORMAL HIGH (ref 65–99)
Glucose-Capillary: 111 mg/dL — ABNORMAL HIGH (ref 65–99)

## 2016-08-09 MED ORDER — AMLODIPINE BESYLATE 5 MG PO TABS
5.0000 mg | ORAL_TABLET | Freq: Every day | ORAL | Status: DC
  Administered 2016-08-09 – 2016-08-10 (×2): 5 mg via ORAL
  Filled 2016-08-09 (×2): qty 1

## 2016-08-09 MED ORDER — PROMETHAZINE HCL 25 MG/ML IJ SOLN
6.2500 mg | Freq: Three times a day (TID) | INTRAMUSCULAR | Status: DC | PRN
  Administered 2016-08-09: 6.25 mg via INTRAVENOUS
  Filled 2016-08-09: qty 1

## 2016-08-09 MED ORDER — TRAMADOL HCL 50 MG PO TABS
50.0000 mg | ORAL_TABLET | Freq: Four times a day (QID) | ORAL | Status: DC | PRN
  Administered 2016-08-09: 50 mg via ORAL
  Filled 2016-08-09: qty 1

## 2016-08-09 MED ORDER — ONDANSETRON HCL 4 MG/2ML IJ SOLN
4.0000 mg | Freq: Four times a day (QID) | INTRAMUSCULAR | Status: DC | PRN
  Administered 2016-08-11: 4 mg via INTRAVENOUS
  Filled 2016-08-09 (×3): qty 2

## 2016-08-09 MED ORDER — HYDROCHLOROTHIAZIDE 12.5 MG PO CAPS: 12.5000 mg | ORAL_CAPSULE | Freq: Every day | ORAL | Status: DC

## 2016-08-09 MED ORDER — LISINOPRIL 20 MG PO TABS
40.0000 mg | ORAL_TABLET | Freq: Every day | ORAL | Status: DC
  Administered 2016-08-09 – 2016-08-11 (×3): 40 mg via ORAL
  Filled 2016-08-09 (×3): qty 2

## 2016-08-09 NOTE — Progress Notes (Addendum)
PROGRESS NOTE    [name]  [id number] DOB: October 15, 1956 DOA: 08/05/2016 PCP: Annye Asa, MD   Outpatient Specialists:     Brief Narrative:  [name] is a 60 y.o. female with medical history significant of diverticulitis, hypertension, diabetes mellitus, GERD, depression, renal cell cancer (s/p of left nephrostomy 13 years ago, no radiation and chemotherapy), IBS, CKD-II, who presents with abdominal pain.  Patient states that she has been having abdominal pain for 3 days. It is associated with nausea, but not vomiting or diarrhea. She could to No fever, but has chills. Abdominal pain is located in the left lower quadrant, constant, cramping pain, 10 out of 10 in severity, nonradiating. It is not aggravated or alleviated by any known factors. She states that she can barely eat any foods. Slow to improve.  Advancing diet slowly.     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 -lower dose of 70/30 -SSI -advance diet  CKD-II: stable. Baseline creatinine 1.0-1.2.  -Follow-up renal function by BMP  Morbid Obesity  DVT prophylaxis:  Lovenox   Code Status: Full Code   Family Communication: Patient/daughter at bedside  Disposition  Plan:     Consultants:        Subjective: Still having pain  Objective: Vitals:   08/09/16 0641 08/09/16 0642 08/09/16 0839 08/09/16 1028  BP:  (!) 184/100 (!) 172/82 (!) 156/62  Pulse: (!) 106 (!) 119    Resp:      Temp:      TempSrc:      SpO2:      Weight:      Height:        Intake/Output Summary (Last 24 hours) at 08/09/16 1151 Last data filed at 08/09/16 1000  Gross per 24 hour  Intake             1480 ml  Output             2375 ml  Net             -895 ml   Filed Weights   08/06/16 0037  Weight: 103 kg (227 lb)    Examination:  General exam: mild pain- rocking back and forth Respiratory system: Clear to auscultation. Respiratory effort normal. Cardiovascular system: S1 & S2 heard, RRR. No JVD, murmurs, rubs, gallops or clicks. No pedal edema. Gastrointestinal system: pain in left lower quadrant, +BS Central nervous system: Alert and oriented. No focal neurological deficits. Extremities: Symmetric 5 x 5 power.     Data Reviewed: I have personally reviewed following labs and imaging studies  CBC:  Recent Labs Lab 08/05/16 1614 08/06/16 0432 08/07/16 0413 08/08/16 0414 08/09/16 0507  WBC 12.3* 12.6* 11.0* 8.4 7.8  HGB 12.3 11.3* 10.6* 11.5* 12.3  HCT 38.2 35.7* 32.8* 35.9* 37.8  MCV 86.8 87.3 85.0 86.3 85.7  PLT 280 227 239 270 [id number]   Basic Metabolic Panel:  Recent Labs Lab 08/05/16 1614 08/06/16 0432 08/07/16 0413 08/08/16 0414 08/09/16 0507  NA 138 136 137 136 134*  K 3.7 3.7 3.4* 3.7 3.5  CL 101 104 102 100* 98*  CO2 29 26 27 27 24   GLUCOSE 132* 172* 83 88 116*  BUN 22* 16 11 10 10   CREATININE 1.15* 1.03* 0.93 0.94 0.97  CALCIUM 9.1 8.2* 8.4* 8.6* 8.7*   GFR: Estimated Creatinine Clearance: 71.2 mL/min (by C-G formula based on SCr of 0.97 mg/dL). Liver Function Tests:  Recent Labs Lab 08/05/16 1614 08/07/16 0413  AST 14* 14*  ALT 14 12*  ALKPHOS 74 56  BILITOT 0.4 0.3  PROT 8.3* 7.0  ALBUMIN 3.1* 2.6*     Recent Labs Lab 08/05/16 1614  LIPASE 46   No results for input(s): AMMONIA in the last 168 hours. Coagulation Profile:  Recent Labs Lab 08/06/16 0053  INR 1.09   Cardiac Enzymes: No results for input(s): CKTOTAL, CKMB, CKMBINDEX, TROPONINI in the last 168 hours. BNP (last 3 results) No results for input(s): PROBNP in the last 8760 hours. HbA1C: No results for input(s): HGBA1C in the last 72 hours. CBG:  Recent Labs Lab 08/08/16 1217 08/08/16 1259 08/08/16 1702 08/08/16 2108 08/09/16 0721  GLUCAP 55* 80 82 93 135*   Lipid Profile: No results for input(s): CHOL, HDL, LDLCALC, TRIG, CHOLHDL, LDLDIRECT in the last 72 hours. Thyroid Function Tests: No results for input(s): TSH, T4TOTAL, FREET4, T3FREE, THYROIDAB in the last 72 hours. Anemia Panel: No results for input(s): VITAMINB12, FOLATE, FERRITIN, TIBC, IRON, RETICCTPCT in the last 72 hours. Urine analysis:    Component Value Date/Time   COLORURINE YELLOW 08/05/2016 1828   APPEARANCEUR CLOUDY (A) 08/05/2016 1828   LABSPEC 1.020 08/05/2016 1828   PHURINE 5.5 08/05/2016 1828   GLUCOSEU NEGATIVE 08/05/2016 1828   GLUCOSEU NEGATIVE 07/28/2009 1005   HGBUR TRACE (A) 08/05/2016 1828   BILIRUBINUR NEGATIVE 08/05/2016 1828   BILIRUBINUR  08/20/2013 1147     Comment:     Limited sample, not performed   KETONESUR NEGATIVE 08/05/2016 1828   PROTEINUR >300 (A) 08/05/2016 1828   UROBILINOGEN 0.2 03/01/2014 1303   NITRITE NEGATIVE 08/05/2016 1828   LEUKOCYTESUR NEGATIVE 08/05/2016 1828     ) Recent Results (from the past 240 hour(s))  Culture, blood (x 2)     Status: None (Preliminary result)   Collection Time: 08/06/16 12:53 AM  Result Value Ref Range Status   Specimen Description BLOOD RIGHT ANTECUBITAL  Final   Special Requests IN PEDIATRIC BOTTLE 1ML  Final   Culture   Final    NO GROWTH 2 DAYS Performed at [facility]    Report Status PENDING  Incomplete  Culture, blood (x 2)     Status: None  (Preliminary result)   Collection Time: 08/06/16 12:53 AM  Result Value Ref Range Status   Specimen Description BLOOD RIGHT HAND  Final   Special Requests IN PEDIATRIC BOTTLE 1ML  Final   Culture   Final    NO GROWTH 2 DAYS Performed at [facility]    Report Status PENDING  Incomplete      Anti-infectives    Start     Dose/Rate Route Frequency Ordered Stop   08/06/16 2359  cefTRIAXone (ROCEPHIN) 2 g in dextrose 5 % 50 mL IVPB     2 g 100 mL/hr over 30 Minutes Intravenous Every 24 hours 08/06/16 0137  08/06/16 0800  metroNIDAZOLE (FLAGYL) IVPB 500 mg     500 mg 100 mL/hr over 60 Minutes Intravenous Every 8 hours 08/06/16 0042     08/06/16 0145  cefTRIAXone (ROCEPHIN) 1 g in dextrose 5 % 50 mL IVPB     1 g 100 mL/hr over 30 Minutes Intravenous STAT 08/06/16 0138 08/06/16 0237   08/06/16 0045  cefTRIAXone (ROCEPHIN) 1 g in dextrose 5 % 50 mL IVPB  Status:  Discontinued     1 g 100 mL/hr over 30 Minutes Intravenous Every 24 hours 08/06/16 0042 08/06/16 0136   08/05/16 2315  cefTRIAXone (ROCEPHIN) 1 g in dextrose 5 % 50 mL IVPB     1 g 100 mL/hr over 30 Minutes Intravenous  Once 08/05/16 2304 08/06/16 0005   08/05/16 2315  metroNIDAZOLE (FLAGYL) IVPB 500 mg     500 mg 100 mL/hr over 60 Minutes Intravenous  Once 08/05/16 2304 08/06/16 0105       Radiology Studies: No results found.      Scheduled Meds: . amLODipine  5 mg Oral Daily  . cefTRIAXone (ROCEPHIN)  IV  2 g Intravenous Q24H  . docusate sodium  100 mg Oral BID  . enoxaparin (LOVENOX) injection  50 mg Subcutaneous Q24H  . insulin aspart  0-9 Units Subcutaneous TID WC  . insulin aspart protamine- aspart  15 Units Subcutaneous Q breakfast  . insulin aspart protamine- aspart  7 Units Subcutaneous Q supper  . lisinopril  40 mg Oral Daily  . metronidazole  500 mg Intravenous Q8H   Continuous Infusions: . sodium chloride 50 mL/hr at 08/09/16 0600     LOS: 3 days    Time spent: 25 min    [place], DO [facility] Pager [phone]  If 7PM-7AM, please contact night-coverage [email or web address] Password [facility] 08/09/2016, 11:51 AM

## 2016-08-10 LAB — BASIC METABOLIC PANEL
ANION GAP: 12 (ref 5–15)
BUN: 13 mg/dL (ref 6–20)
CHLORIDE: 99 mmol/L — AB (ref 101–111)
CO2: 24 mmol/L (ref 22–32)
Calcium: 8.7 mg/dL — ABNORMAL LOW (ref 8.9–10.3)
Creatinine, Ser: 0.95 mg/dL (ref 0.44–1.00)
GFR calc non Af Amer: >60 mL/min (ref >60)
Glucose, Bld: 139 mg/dL — ABNORMAL HIGH (ref 65–99)
POTASSIUM: 3.4 mmol/L — AB (ref 3.5–5.1)
SODIUM: 135 mmol/L (ref 135–145)

## 2016-08-10 LAB — CBC
HCT: 37.7 % (ref 36.0–46.0)
HEMOGLOBIN: 12.1 g/dL (ref 12.0–15.0)
MCH: 27.5 pg (ref 26.0–34.0)
MCHC: 32.1 g/dL (ref 30.0–36.0)
MCV: 85.7 fL (ref 78.0–100.0)
Platelets: 332 10*3/uL (ref 150–400)
RBC: 4.4 MIL/uL (ref 3.87–5.11)
RDW: 13.8 % (ref 11.5–15.5)
WBC: 8.2 10*3/uL (ref 4.0–10.5)

## 2016-08-10 LAB — GLUCOSE, CAPILLARY
GLUCOSE-CAPILLARY: 133 mg/dL — AB (ref 65–99)
GLUCOSE-CAPILLARY: 113 mg/dL — AB (ref 65–99)
Glucose-Capillary: 129 mg/dL — ABNORMAL HIGH (ref 65–99)
Glucose-Capillary: 111 mg/dL — ABNORMAL HIGH (ref 65–99)

## 2016-08-10 MED ORDER — DIPHENHYDRAMINE HCL 25 MG PO CAPS: 25.0000 mg | ORAL_CAPSULE | Freq: Every evening | ORAL | Status: DC | PRN

## 2016-08-10 MED ORDER — POTASSIUM CHLORIDE CRYS ER 20 MEQ PO TBCR
40.0000 meq | EXTENDED_RELEASE_TABLET | Freq: Once | ORAL | Status: AC
  Administered 2016-08-10: 40 meq via ORAL
  Filled 2016-08-10: qty 2

## 2016-08-10 MED ORDER — POLYETHYLENE GLYCOL 3350 17 G PO PACK
17.0000 g | PACK | Freq: Every day | ORAL | Status: DC
  Administered 2016-08-10: 17 g via ORAL
  Filled 2016-08-10: qty 1

## 2016-08-10 MED ORDER — FAMOTIDINE 20 MG PO TABS
10.0000 mg | ORAL_TABLET | Freq: Two times a day (BID) | ORAL | Status: DC
  Administered 2016-08-10: 10 mg via ORAL
  Filled 2016-08-10 (×2): qty 1

## 2016-08-10 MED ORDER — AMLODIPINE BESYLATE 5 MG PO TABS
5.0000 mg | ORAL_TABLET | Freq: Two times a day (BID) | ORAL | Status: DC
  Administered 2016-08-10 – 2016-08-11 (×2): 5 mg via ORAL
  Filled 2016-08-10 (×2): qty 1

## 2016-08-10 MED ORDER — POTASSIUM CHLORIDE 20 MEQ PO PACK: 40.0000 meq | PACK | Freq: Once | ORAL | Status: DC

## 2016-08-10 MED ORDER — MORPHINE SULFATE (PF) 2 MG/ML IV SOLN
2.0000 mg | Freq: Three times a day (TID) | INTRAVENOUS | Status: DC | PRN
  Administered 2016-08-11: 2 mg via INTRAVENOUS
  Filled 2016-08-10: qty 1

## 2016-08-10 MED ORDER — OXYCODONE-ACETAMINOPHEN 5-325 MG PO TABS
1.0000 | ORAL_TABLET | ORAL | Status: DC | PRN
  Administered 2016-08-10: 1 via ORAL
  Filled 2016-08-10 (×2): qty 1

## 2016-08-10 NOTE — Progress Notes (Signed)
PROGRESS NOTE    [name]  [id number] DOB: [birth year] DOA: 08/05/2016 PCP: Annye Asa, MD    Brief Narrative: 60 y.o.femalewith medical history significant of diverticulitis, hypertension, diabetes mellitus, GERD, depression, renal cell cancer (s/pof left nephrostomy 13 years ago, no radiation andchemotherapy), IBS, CKD-II, whopresents with abdominal pain. CT scan of abdomen consistent with acute diverticulitis involving the ascending colon. Patient is currently on antibiotics. Advancing diet slowly.  Assessment & Plan:   # Diverticulitis of intestine without perforation or abscess without bleeding: Sepsis on admission, as per medical record. Continue ceftriaxone and Flagyl. Patient reported tolerating clear liquid diet well. She still has abdominal discomfort. Plan to advance diet today. I adjusted the pain medication including decreasing the frequency of IV morphine and added oral pain medication. The goal is to try to taper down the narcotics and IV pain medicine. -Added oral Pepcid. -Continue current supportive treatment. -Ordered MiraLAX for the management of constipation. Patient is already on Colace.  #  Essential hypertension: Blood pressure is acceptable. Continue Norvasc and lisinopril.   # Uncontrolled type 2 diabetes with retinopathy ([place]): Continue current insulin regimen. Monitor blood sugar level.    DVT prophylaxis: Lovenox subcutaneous Code Status: Full code Family Communication: No family present at bedside Disposition Plan: Likely discharge home tomorrow.  Consultants:   None  Procedures: Antimicrobials: On IV Flagyl and ceftriaxone since September 23  Subjective: Patient was seen and examined at bedside. Patient reported tolerating liquid diet well. Denied nausea or vomiting. Still reported abdominal pain discomfort however better than yesterday. She has no bowel movement for last 3-4 days. Denied chest pain or shortness of  breath.   Objective: Vitals:   08/09/16 2148 08/09/16 2306 08/10/16 0543 08/10/16 0943  BP: (!) 179/81 (!) 166/84 (!) 159/70 139/79  Pulse: (!) 114 (!) 111 (!) 101 (!) 105  Resp: 18  17   Temp: 98.6 F (37 C)  98.9 F (37.2 C)   TempSrc: Oral  Oral   SpO2: 99%  96%   Weight:      Height:        Intake/Output Summary (Last 24 hours) at 08/10/16 1140 Last data filed at 08/10/16 0958  Gross per 24 hour  Intake          1333.33 ml  Output             1100 ml  Net           233.33 ml   Filed Weights   08/06/16 0037  Weight: 103 kg (227 lb)    Examination:  General exam: Appears calm and comfortable  Respiratory system: Clear to auscultation. Respiratory effort normal. Cardiovascular system: S1 & S2 heard, RRR. No pedal edema. Gastrointestinal system: Abdomen is nondistended, soft and Diffuse mild tenderness. No guarding. Normal bowel sounds heard. Central nervous system: Alert and oriented. No focal neurological deficits. Extremities: Symmetric 5 x 5 power. Skin: No rashes, lesions or ulcers Psychiatry: Judgement and insight appear normal. Mood & affect appropriate.     Data Reviewed: I have personally reviewed following labs and imaging studies  CBC:  Recent Labs Lab 08/06/16 0432 08/07/16 0413 08/08/16 0414 08/09/16 0507 08/10/16 0441  WBC 12.6* 11.0* 8.4 7.8 8.2  HGB 11.3* 10.6* 11.5* 12.3 12.1  HCT 35.7* 32.8* 35.9* 37.8 37.7  MCV 87.3 85.0 86.3 85.7 85.7  PLT 227 239 270 300 [id number]   Basic Metabolic Panel:  Recent Labs Lab 08/06/16 0432 08/07/16 0413 08/08/16 0414 08/09/16 0507 08/10/16 0441  NA 136 137 136 134* 135  K 3.7 3.4* 3.7 3.5 3.4*  CL 104 102 100* 98* 99*  CO2 26 27 27 24 24   GLUCOSE 172* 83 88 116* 139*  BUN 16 11 10 10 13   CREATININE 1.03* 0.93 0.94 0.97 0.95  CALCIUM 8.2* 8.4* 8.6* 8.7* 8.7*   GFR: Estimated Creatinine Clearance: 72.7 mL/min (by C-G formula based on SCr of 0.95 mg/dL). Liver Function Tests:  Recent Labs Lab  08/05/16 1614 08/07/16 0413  AST 14* 14*  ALT 14 12*  ALKPHOS 74 56  BILITOT 0.4 0.3  PROT 8.3* 7.0  ALBUMIN 3.1* 2.6*    Recent Labs Lab 08/05/16 1614  LIPASE 46   No results for input(s): AMMONIA in the last 168 hours. Coagulation Profile:  Recent Labs Lab 08/06/16 0053  INR 1.09   Cardiac Enzymes: No results for input(s): CKTOTAL, CKMB, CKMBINDEX, TROPONINI in the last 168 hours. BNP (last 3 results) No results for input(s): PROBNP in the last 8760 hours. HbA1C: No results for input(s): HGBA1C in the last 72 hours. CBG:  Recent Labs Lab 08/09/16 0721 08/09/16 1206 08/09/16 1657 08/09/16 2156 08/10/16 0738  GLUCAP 135* 112* 124* 111* 133*   Lipid Profile: No results for input(s): CHOL, HDL, LDLCALC, TRIG, CHOLHDL, LDLDIRECT in the last 72 hours. Thyroid Function Tests: No results for input(s): TSH, T4TOTAL, FREET4, T3FREE, THYROIDAB in the last 72 hours. Anemia Panel: No results for input(s): VITAMINB12, FOLATE, FERRITIN, TIBC, IRON, RETICCTPCT in the last 72 hours. Sepsis Labs:  Recent Labs Lab 08/06/16 0053 08/06/16 0432  PROCALCITON <0.10  --   LATICACIDVEN 1.0 0.8    Recent Results (from the past 240 hour(s))  Culture, blood (x 2)     Status: None (Preliminary result)   Collection Time: 08/06/16 12:53 AM  Result Value Ref Range Status   Specimen Description BLOOD RIGHT ANTECUBITAL  Final   Special Requests IN PEDIATRIC BOTTLE 1ML  Final   Culture   Final    NO GROWTH 3 DAYS Performed at [facility]    Report Status PENDING  Incomplete  Culture, blood (x 2)     Status: None (Preliminary result)   Collection Time: 08/06/16 12:53 AM  Result Value Ref Range Status   Specimen Description BLOOD RIGHT HAND  Final   Special Requests IN PEDIATRIC BOTTLE 1ML  Final   Culture   Final    NO GROWTH 3 DAYS Performed at [facility]    Report Status PENDING  Incomplete         Radiology Studies: No results  found.      Scheduled Meds: . amLODipine  5 mg Oral Daily  . cefTRIAXone (ROCEPHIN)  IV  2 g Intravenous Q24H  . docusate sodium  100 mg Oral BID  . enoxaparin (LOVENOX) injection  50 mg Subcutaneous Q24H  . insulin aspart  0-9 Units Subcutaneous TID WC  . insulin aspart protamine- aspart  15 Units Subcutaneous Q breakfast  . insulin aspart protamine- aspart  7 Units Subcutaneous Q supper  . lisinopril  40 mg Oral Daily  . metronidazole  500 mg Intravenous Q8H  . polyethylene glycol  17 g Oral Daily   Continuous Infusions: . sodium chloride 50 mL/hr at 08/10/16 0639     LOS: 4 days    Time spent: 26 minutes    Dron Tanna Furry, MD [facility] Pager [phone]  If 7PM-7AM, please contact night-coverage [email or web address] Password [email or web address] 08/10/2016, 11:40 AM

## 2016-08-10 NOTE — Progress Notes (Signed)
Loss of IV access due to leaking at the site. IV team notified to reestablish.

## 2016-08-11 LAB — CULTURE, BLOOD (ROUTINE X 2)
Culture: NO GROWTH
Culture: NO GROWTH

## 2016-08-11 LAB — GLUCOSE, CAPILLARY
GLUCOSE-CAPILLARY: 143 mg/dL — AB (ref 65–99)
Glucose-Capillary: 142 mg/dL — ABNORMAL HIGH (ref 65–99)

## 2016-08-11 MED ORDER — LISINOPRIL 40 MG PO TABS: 40.0000 mg | ORAL_TABLET | Freq: Every day | ORAL | 0 refills | Status: DC

## 2016-08-11 MED ORDER — AMLODIPINE BESYLATE 5 MG PO TABS: 5.0000 mg | ORAL_TABLET | Freq: Two times a day (BID) | ORAL | 0 refills | Status: DC

## 2016-08-11 MED ORDER — ONDANSETRON HCL 4 MG PO TABS: ORAL_TABLET | ORAL | 1 refills | Status: DC

## 2016-08-11 MED ORDER — METRONIDAZOLE 500 MG PO TABS: 500.0000 mg | ORAL_TABLET | Freq: Three times a day (TID) | ORAL | 0 refills | Status: AC

## 2016-08-11 MED ORDER — METOPROLOL TARTRATE 25 MG PO TABS: 12.5000 mg | ORAL_TABLET | Freq: Two times a day (BID) | ORAL | 0 refills | Status: DC

## 2016-08-11 MED ORDER — PANTOPRAZOLE SODIUM 40 MG PO TBEC: 40.0000 mg | DELAYED_RELEASE_TABLET | Freq: Every day | ORAL | 0 refills | Status: DC

## 2016-08-11 MED ORDER — CIPROFLOXACIN HCL 500 MG PO TABS: 500.0000 mg | ORAL_TABLET | Freq: Two times a day (BID) | ORAL | 0 refills | Status: AC

## 2016-08-11 NOTE — Discharge Summary (Signed)
Physician Discharge Summary  [place] BFX:[id number] DOB: 1956/10/11 DOA: 08/05/2016  PCP: Annye Asa, MD  Admit date: 08/05/2016 Discharge date: 08/11/2016  Admitted From: Home Disposition:home  Recommendations for Outpatient Follow-up:  1. Follow up with PCP in 1-2 weeks 2. Please obtain BMP/CBC in one week  Home Health:no Equipment/Devices:no  Discharge Condition:stable CODE STATUS:full Diet recommendation:carb modified heart healthy  Brief/Interim Summary:59 y.o.femalewith medical history significant of diverticulitis, hypertension, diabetes mellitus, GERD, depression, renal cell cancer (s/pof left nephrostomy 13 years ago, no radiation andchemotherapy), IBS, CKD-II, whopresents with abdominal pain. CT scan of abdomen consistent with acute diverticulitis involving the ascending colon. The patient had sepsis on admission as per medical record. She was treated with IV antibiotics in the hospital with clinical improvement. Patient tolerated Diet well. Patient is able to tolerate diet without any difficulty. She will be discharged with oral antibiotics. The medication as listed in the hospital for the management of uncontrolled hypertension. Added metoprolol. I advised patient to monitor blood pressure at home and follow up with her primary care doctor. She is currently on insulin for the management of type 2 diabetes.  At this time, patient is medically stable. I advised to follow-up with her PCP. She verbalized understanding of follow-up instructions.  Discharge Diagnoses:  Principal Problem:   Diverticulitis of intestine without perforation or abscess without bleeding Active Problems:   Essential hypertension   Abdominal pain   Uncontrolled type 2 diabetes with retinopathy ([place])   Sepsis [facility])      Discharge Instructions  Discharge Instructions    Call MD for:  difficulty breathing, headache or visual disturbances    Complete by:  As directed    Call MD  for:  persistant nausea and vomiting    Complete by:  As directed    Call MD for:  temperature >100.4    Complete by:  As directed    Diet - low sodium heart healthy    Complete by:  As directed    Diet Carb Modified    Complete by:  As directed    Discharge instructions    Complete by:  As directed    Please monitor BP and follow up with your PCP Please take yogurt or probiotic while on antibiotics Please follow up with your GI   Increase activity slowly    Complete by:  As directed        Medication List    STOP taking these medications   cyclobenzaprine 10 MG tablet Commonly known as:  FLEXERIL   diclofenac sodium 1 % Gel Commonly known as:  VOLTAREN   dicyclomine 20 MG tablet Commonly known as:  BENTYL   DULoxetine 30 MG capsule Commonly known as:  CYMBALTA   gabapentin 300 MG capsule Commonly known as:  NEURONTIN   glipiZIDE 5 MG tablet Commonly known as:  GLUCOTROL   hydrochlorothiazide 12.5 MG capsule Commonly known as:  MICROZIDE   ibuprofen 200 MG tablet Commonly known as:  ADVIL,MOTRIN   lisinopril-hydrochlorothiazide 20-12.5 MG tablet Commonly known as:  PRINZIDE,ZESTORETIC   naproxen sodium 220 MG tablet Commonly known as:  ANAPROX   NOVOLIN N RELION 100 UNIT/ML injection Generic drug:  insulin NPH Human   ranitidine 300 MG tablet Commonly known as:  ZANTAC   valACYclovir 1000 MG tablet Commonly known as:  VALTREX     TAKE these medications   amLODipine 5 MG tablet Commonly known as:  NORVASC Take 1 tablet (5 mg total) by mouth 2 (two) times daily.  ciprofloxacin 500 MG tablet Commonly known as:  CIPRO Take 1 tablet (500 mg total) by mouth 2 (two) times daily.   glucose blood test strip Commonly known as:  ONE TOUCH ULTRA TEST Use to test blood sugar 2 times daily as instructed. Dx: E11.65   Insulin Syringe-Needle U-100 31G X 5/16" 0.5 ML Misc Commonly known as:  RELION INSULIN SYR 0.5ML/31G USE ONE SYRINGE TO INJECT INSULIN  TWICE DAILY AS INSTRUCTED   lisinopril 40 MG tablet Commonly known as:  PRINIVIL,ZESTRIL Take 1 tablet (40 mg total) by mouth daily. Start taking on:  08/12/2016   metoprolol tartrate 25 MG tablet Commonly known as:  LOPRESSOR Take 0.5 tablets (12.5 mg total) by mouth 2 (two) times daily.   metroNIDAZOLE 500 MG tablet Commonly known as:  FLAGYL Take 1 tablet (500 mg total) by mouth 3 (three) times daily.   NOVOLOG MIX 70/30 (70-30) 100 UNIT/ML injection Generic drug:  insulin aspart protamine- aspart Inject 20-35 Units into the skin 2 (two) times daily with a meal. Inject 35 units in the am and Inject 20 units in the evening.   ondansetron 4 MG tablet Commonly known as:  ZOFRAN Take 1 tab every 6 hours as needed for nausea.   ONE TOUCH ULTRA MINI w/Device Kit Use to test blood sugar daily. Dx: G86.76   ONETOUCH DELICA LANCETS FINE Misc Use to test blood sugar 2 times daily. Dx: E11.65   pantoprazole 40 MG tablet Commonly known as:  PROTONIX Take 1 tablet (40 mg total) by mouth daily.      Follow-up Information    Annye Asa, MD. Schedule an appointment as soon as possible for a visit in 1 week(s).   Specialty:  Family Medicine Contact information: [address] [place] [address] [phone]          No Known Allergies  Consultations:   Procedures/Studies: Ct Abdomen Pelvis W Contrast  Result Date: 08/05/2016 CLINICAL DATA:  Left pelvic pain x3 days, history of diverticulitis EXAM: CT ABDOMEN AND PELVIS WITH CONTRAST TECHNIQUE: Multidetector CT imaging of the abdomen and pelvis was performed using the standard protocol following bolus administration of intravenous contrast. CONTRAST:  100 mL Isovue 300 IV COMPARISON:  CT abdomen/pelvis dated 08/16/2013 FINDINGS: Lower chest: Lung bases are clear. Hepatobiliary: Liver is within normal limits. Status post cholecystectomy. No intrahepatic or extrahepatic ductal dilatation. Pancreas: Within normal  limits. Spleen: Within normal limits. Adrenals/Urinary Tract: Adrenal glands are within normal limits. Status post left nephrectomy. Right kidney is within normal limits. No hydronephrosis. Bladder is within normal limits. Stomach/Bowel: Stomach is within normal limits. No evidence of bowel obstruction. Normal appendix (series 2/image 55). Extensive colonic diverticulosis with inflammatory changes/diverticulitis involving the ascending colon (series 2/ image 13). No drainable fluid collection/abscess. No free air to suggest macroscopic perforation. Vascular/Lymphatic: No evidence of abdominal aortic aneurysm. No suspicious abdominopelvic lymphadenopathy. Reproductive: Status post hysterectomy. No adnexal masses. Other: No abdominopelvic ascites. Small fat containing right para midline ventral hernia (series 2/image 59). Musculoskeletal: Visualized osseous structures are within normal limits. IMPRESSION: Acute diverticulitis involving the ascending colon. No drainable fluid collection/abscess. No free air. No evidence of bowel obstruction.  Normal appendix. Status post left nephrectomy, cholecystectomy, and hysterectomy. Electronically Signed   By: Julian Hy M.D.   On: 08/05/2016 20:59      Subjective:   Discharge Exam: Vitals:   08/11/16 0908 08/11/16 1004  BP: (!) 157/66 (!) 157/72  Pulse: 94   Resp: 18  Temp: 98.6 F (37 C)    Vitals:   08/10/16 2136 08/11/16 0529 08/11/16 0908 08/11/16 1004  BP: (!) 177/74 (!) 183/81 (!) 157/66 (!) 157/72  Pulse: 94 88 94   Resp: _0 Temp: 98.9 F (37.2 C) 98.7 F (37.1 C) 98.6 F (37 C)   TempSrc: Oral Oral Oral   SpO2: 98% 99% 100%   Weight:      Height:        General: Pt is alert, awake, not in acute distress Cardiovascular: RRR, S1/S2 +, no rubs, no gallops Respiratory: CTA bilaterally, no wheezing, no rhonchi Abdominal: Soft, NT, ND, bowel sounds + Extremities: no edema, no cyanosis    The results of significant  diagnostics from this hospitalization (including imaging, microbiology, ancillary and laboratory) are listed below for reference.     Microbiology: Recent Results (from the past 240 hour(s))  Culture, blood (x 2)     Status: None (Preliminary result)   Collection Time: 08/06/16 12:53 AM  Result Value Ref Range Status   Specimen Description BLOOD RIGHT ANTECUBITAL  Final   Special Requests IN PEDIATRIC BOTTLE 1ML  Final   Culture   Final    NO GROWTH 4 DAYS Performed at [facility]    Report Status PENDING  Incomplete  Culture, blood (x 2)     Status: None (Preliminary result)   Collection Time: 08/06/16 12:53 AM  Result Value Ref Range Status   Specimen Description BLOOD RIGHT HAND  Final   Special Requests IN PEDIATRIC BOTTLE 1ML  Final   Culture   Final    NO GROWTH 4 DAYS Performed at [facility]    Report Status PENDING  Incomplete     Labs: BNP (last 3 results) No results for input(s): BNP in the last 8760 hours. Basic Metabolic Panel:  Recent Labs Lab 08/06/16 0432 08/07/16 0413 08/08/16 0414 08/09/16 0507 08/10/16 0441  NA 136 137 136 134* 135  K 3.7 3.4* 3.7 3.5 3.4*  CL 104 102 100* 98* 99*  CO2 _1 GLUCOSE 172* 83 88 116* 139*  BUN _2 CREATININE 1.03* 0.93 0.94 0.97 0.95  CALCIUM 8.2* 8.4* 8.6* 8.7* 8.7*   Liver Function Tests:  Recent Labs Lab 08/05/16 1614 08/07/16 0413  AST 14* 14*  ALT 14 12*  ALKPHOS 74 56  BILITOT 0.4 0.3  PROT 8.3* 7.0  ALBUMIN 3.1* 2.6*    Recent Labs Lab 08/05/16 1614  LIPASE 46   No results for input(s): AMMONIA in the last 168 hours. CBC:  Recent Labs Lab 08/06/16 0432 08/07/16 0413 08/08/16 0414 08/09/16 0507 08/10/16 0441  WBC 12.6* 11.0* 8.4 7.8 8.2  HGB 11.3* 10.6* 11.5* 12.3 12.1  HCT 35.7* 32.8* 35.9* 37.8 37.7  MCV 87.3 85.0 86.3 85.7 85.7  PLT 227 239 270 300 332   Cardiac Enzymes: No results for input(s): CKTOTAL, CKMB, CKMBINDEX, TROPONINI in the  last 168 hours. BNP: Invalid input(s): POCBNP CBG:  Recent Labs Lab 08/10/16 0738 08/10/16 1204 08/10/16 1620 08/10/16 2134 08/11/16 0734  GLUCAP 133* 113* 129* 111* 143*   D-Dimer No results for input(s): DDIMER in the last 72 hours. Hgb A1c No results for input(s): HGBA1C in the last 72 hours. Lipid Profile No results for input(s): CHOL, HDL, LDLCALC, TRIG, CHOLHDL, LDLDIRECT in the last 72 hours. Thyroid function studies No results for input(s): TSH, T4TOTAL, T3FREE, THYROIDAB in the last 72 hours.  Invalid input(s): FREET3 Anemia work up No results for input(s): VITAMINB12, FOLATE, FERRITIN, TIBC, IRON, RETICCTPCT in the last 72 hours. Urinalysis    Component Value Date/Time   COLORURINE YELLOW 08/05/2016 1828   APPEARANCEUR CLOUDY (A) 08/05/2016 1828   LABSPEC 1.020 08/05/2016 1828   PHURINE 5.5 08/05/2016 1828   GLUCOSEU NEGATIVE 08/05/2016 1828   GLUCOSEU NEGATIVE 07/28/2009 1005   HGBUR TRACE (A) 08/05/2016 1828   BILIRUBINUR NEGATIVE 08/05/2016 1828   BILIRUBINUR  08/20/2013 1147     Comment:     Limited sample, not performed   KETONESUR NEGATIVE 08/05/2016 1828   PROTEINUR >300 (A) 08/05/2016 1828   UROBILINOGEN 0.2 03/01/2014 1303   NITRITE NEGATIVE 08/05/2016 1828   LEUKOCYTESUR NEGATIVE 08/05/2016 1828   Sepsis Labs Invalid input(s): PROCALCITONIN,  WBC,  LACTICIDVEN Microbiology Recent Results (from the past 240 hour(s))  Culture, blood (x 2)     Status: None (Preliminary result)   Collection Time: 08/06/16 12:53 AM  Result Value Ref Range Status   Specimen Description BLOOD RIGHT ANTECUBITAL  Final   Special Requests IN PEDIATRIC BOTTLE 1ML  Final   Culture   Final    NO GROWTH 4 DAYS Performed at [facility]    Report Status PENDING  Incomplete  Culture, blood (x 2)     Status: None (Preliminary result)   Collection Time: 08/06/16 12:53 AM  Result Value Ref Range Status   Specimen Description BLOOD RIGHT HAND  Final   Special  Requests IN PEDIATRIC BOTTLE 1ML  Final   Culture   Final    NO GROWTH 4 DAYS Performed at [facility]    Report Status PENDING  Incomplete     Time coordinating discharge:  26 minutes  SIGNED:   Rosita Fire, MD  [facility] 08/11/2016, 11:27 AM Pager   If 7PM-7AM, please contact night-coverage [email or web address] Password [email or web address]

## 2016-08-11 NOTE — Progress Notes (Signed)
Pt was discharged home today. Instructions were reviewed with patient, and questions were answered. Pt was taken to main entrance via wheelchair by NT.  

## 2016-08-12 ENCOUNTER — Telehealth: Payer: Self-pay | Admitting: *Deleted

## 2016-08-12 NOTE — Telephone Encounter (Signed)
Unable to reach patient at time of TCM Call. Left message for patient to return call when available.  

## 2016-08-15 NOTE — Telephone Encounter (Signed)
Unable to reach patient at time of TCM Call. Left message for patient to return call when available.  

## 2016-08-25 ENCOUNTER — Telehealth: Payer: Self-pay | Admitting: Internal Medicine

## 2016-08-25 DIAGNOSIS — E1165 Type 2 diabetes mellitus with hyperglycemia: Secondary | ICD-10-CM | POA: Diagnosis not present

## 2016-08-25 DIAGNOSIS — I10 Essential (primary) hypertension: Secondary | ICD-10-CM | POA: Diagnosis not present

## 2016-08-25 DIAGNOSIS — E11319 Type 2 diabetes mellitus with unspecified diabetic retinopathy without macular edema: Secondary | ICD-10-CM | POA: Diagnosis not present

## 2016-08-25 DIAGNOSIS — Z794 Long term (current) use of insulin: Secondary | ICD-10-CM | POA: Diagnosis not present

## 2016-08-25 DIAGNOSIS — Z6841 Body Mass Index (BMI) 40.0 and over, adult: Secondary | ICD-10-CM | POA: Diagnosis not present

## 2016-08-25 DIAGNOSIS — K5732 Diverticulitis of large intestine without perforation or abscess without bleeding: Secondary | ICD-10-CM | POA: Diagnosis not present

## 2016-08-25 DIAGNOSIS — M79652 Pain in left thigh: Secondary | ICD-10-CM | POA: Diagnosis not present

## 2016-08-25 NOTE — Telephone Encounter (Signed)
Left message for patient to call back Patient scheduled for tomorrow with Alonza Bogus, PA At 2:30

## 2016-08-26 ENCOUNTER — Ambulatory Visit: Payer: Medicare HMO | Admitting: Gastroenterology

## 2016-08-26 DIAGNOSIS — M79605 Pain in left leg: Secondary | ICD-10-CM | POA: Diagnosis not present

## 2016-08-26 NOTE — Telephone Encounter (Signed)
No answer.  Left another message about appt today at 2:30

## 2016-09-22 DIAGNOSIS — Z6841 Body Mass Index (BMI) 40.0 and over, adult: Secondary | ICD-10-CM | POA: Diagnosis not present

## 2016-09-22 DIAGNOSIS — I10 Essential (primary) hypertension: Secondary | ICD-10-CM | POA: Diagnosis not present

## 2016-09-22 DIAGNOSIS — E1144 Type 2 diabetes mellitus with diabetic amyotrophy: Secondary | ICD-10-CM | POA: Diagnosis not present

## 2016-12-21 ENCOUNTER — Encounter (HOSPITAL_COMMUNITY): Payer: Self-pay | Admitting: Emergency Medicine

## 2016-12-21 ENCOUNTER — Emergency Department (HOSPITAL_COMMUNITY)
Admission: EM | Admit: 2016-12-21 | Discharge: 2016-12-21 | Disposition: A | Discharge status: Home / Self Care | Payer: Medicare HMO | Attending: Emergency Medicine | Admitting: Emergency Medicine

## 2016-12-21 ENCOUNTER — Emergency Department (HOSPITAL_COMMUNITY): Payer: Medicare HMO

## 2016-12-21 DIAGNOSIS — Z794 Long term (current) use of insulin: Secondary | ICD-10-CM | POA: Diagnosis not present

## 2016-12-21 DIAGNOSIS — R1031 Right lower quadrant pain: Secondary | ICD-10-CM | POA: Diagnosis not present

## 2016-12-21 DIAGNOSIS — I129 Hypertensive chronic kidney disease with stage 1 through stage 4 chronic kidney disease, or unspecified chronic kidney disease: Secondary | ICD-10-CM | POA: Diagnosis not present

## 2016-12-21 DIAGNOSIS — Z79899 Other long term (current) drug therapy: Secondary | ICD-10-CM | POA: Diagnosis not present

## 2016-12-21 DIAGNOSIS — E1122 Type 2 diabetes mellitus with diabetic chronic kidney disease: Secondary | ICD-10-CM | POA: Diagnosis not present

## 2016-12-21 DIAGNOSIS — R109 Unspecified abdominal pain: Secondary | ICD-10-CM | POA: Diagnosis not present

## 2016-12-21 DIAGNOSIS — N182 Chronic kidney disease, stage 2 (mild): Secondary | ICD-10-CM | POA: Insufficient documentation

## 2016-12-21 LAB — COMPREHENSIVE METABOLIC PANEL
ALT: 14 U/L (ref 14–54)
AST: 17 U/L (ref 15–41)
Albumin: 3.1 g/dL — ABNORMAL LOW (ref 3.5–5.0)
Alkaline Phosphatase: 69 U/L (ref 38–126)
Anion gap: 8 (ref 5–15)
BUN: 31 mg/dL — ABNORMAL HIGH (ref 6–20)
CO2: 25 mmol/L (ref 22–32)
CREATININE: 1.08 mg/dL — AB (ref 0.44–1.00)
Calcium: 8.9 mg/dL (ref 8.9–10.3)
Chloride: 104 mmol/L (ref 101–111)
GFR calc non Af Amer: 55 mL/min — ABNORMAL LOW (ref >60)
Glucose, Bld: 152 mg/dL — ABNORMAL HIGH (ref 65–99)
Potassium: 3.9 mmol/L (ref 3.5–5.1)
SODIUM: 137 mmol/L (ref 135–145)
Total Bilirubin: 0.3 mg/dL (ref 0.3–1.2)
Total Protein: 7.2 g/dL (ref 6.5–8.1)

## 2016-12-21 LAB — URINALYSIS, ROUTINE W REFLEX MICROSCOPIC
BILIRUBIN URINE: NEGATIVE
Glucose, UA: NEGATIVE mg/dL
Hgb urine dipstick: NEGATIVE
Ketones, ur: NEGATIVE mg/dL
LEUKOCYTES UA: NEGATIVE
Nitrite: NEGATIVE
Protein, ur: 100 mg/dL — AB
SPECIFIC GRAVITY, URINE: 1.02 (ref 1.005–1.030)
pH: 6.5 (ref 5.0–8.0)

## 2016-12-21 LAB — CBC
HCT: 38.2 % (ref 36.0–46.0)
Hemoglobin: 12.3 g/dL (ref 12.0–15.0)
MCH: 28 pg (ref 26.0–34.0)
MCHC: 32.2 g/dL (ref 30.0–36.0)
MCV: 86.8 fL (ref 78.0–100.0)
PLATELETS: 232 10*3/uL (ref 150–400)
RBC: 4.4 MIL/uL (ref 3.87–5.11)
RDW: 13.8 % (ref 11.5–15.5)
WBC: 7.3 10*3/uL (ref 4.0–10.5)

## 2016-12-21 LAB — URINALYSIS, MICROSCOPIC (REFLEX)

## 2016-12-21 LAB — LIPASE, BLOOD: LIPASE: 44 U/L (ref 11–51)

## 2016-12-21 MED ORDER — SODIUM CHLORIDE 0.9 % IV BOLUS (SEPSIS)
500.0000 mL | Freq: Once | INTRAVENOUS | Status: AC
  Administered 2016-12-21: 500 mL via INTRAVENOUS

## 2016-12-21 MED ORDER — IOPAMIDOL (ISOVUE-300) INJECTION 61%
30.0000 mL | Freq: Once | INTRAVENOUS | Status: AC | PRN
  Administered 2016-12-21: 30 mL via ORAL

## 2016-12-21 MED ORDER — MORPHINE SULFATE (PF) 4 MG/ML IV SOLN
4.0000 mg | Freq: Once | INTRAVENOUS | Status: AC
  Administered 2016-12-21: 4 mg via INTRAVENOUS
  Filled 2016-12-21: qty 1

## 2016-12-21 MED ORDER — IOPAMIDOL (ISOVUE-300) INJECTION 61%
INTRAVENOUS | Status: AC
  Administered 2016-12-21: 30 mL via ORAL
  Filled 2016-12-21: qty 30

## 2016-12-21 NOTE — ED Provider Notes (Signed)
[facility] DEPT Provider Note   CSN: [id number] Arrival date & time: 12/21/16  0923     History   Chief Complaint Chief Complaint  Patient presents with  . Back Pain    HPI Vivian Neuwirth is a 61 y.o. female.  HPI Patient presents with concern of right lower quadrant abdominal pain, right flank pain. Onset was maybe 3 weeks ago. Since onset symptoms of been mild, but have progressed to be severe, sharp. Minimal relief with coffee, nose clear alleviating factors. There is a shared nausea, anorexia. No dysuria, no vomiting, no diarrhea. Patient has a notable history of renal cell carcinoma, is status post nephrectomy. Patient also has recent episode of diverticulitis.  Past Medical History:  Diagnosis Date  . Anemia   . Blood transfusion   . Blood transfusion without reported diagnosis   . CKD (chronic kidney disease) stage 2, GFR 60-89 ml/min   . Diabetes mellitus   . Diverticulosis of colon with hemorrhage 2010, 09/2011  . H/O hiatal hernia   . Helicobacter pylori gastritis 09/13/2013  . Hx: UTI (urinary tract infection)   . Hypertension   . IBS (irritable bowel syndrome)   . Personal history of colonic polyps    adenomas since 2010  . Renal cell carcinoma 2004   lt kidney removed    Patient Active Problem List   Diagnosis Date Noted  . Sepsis ([place]) 08/06/2016  . Diverticulitis 08/06/2016  . CKD (chronic kidney disease) stage 2, GFR 60-89 ml/min   . Diverticulitis of intestine without perforation or abscess without bleeding   . Neuralgia of chest 12/30/2015  . Atypical chest pain 12/01/2015  . Physical exam 10/26/2015  . 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, without long-term current use of insulin ([place]) 07/28/2015  . Maxillary sinusitis, acute 01/21/2015  . Strain of left pectoralis muscle 09/26/2014  . Benign paroxysmal positional vertigo 10/03/2013  . Nausea with vomiting 10/03/2013  . Bleeding nose  10/03/2013  . Helicobacter pylori gastritis 09/13/2013  . Pyuria 08/20/2013  . Abnormal abdominal CT scan 08/20/2013  . Abdominal pain 08/16/2013  . Back pain 07/31/2013  . LUQ pain 07/31/2013  . Laceration of hand 07/10/2013  . Hidradenitis suppurativa of right axilla 05/31/2013  . Renal cell carcinoma   . IBS (irritable bowel syndrome)   . S/p nephrectomy   . 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 OBESITY, MORBID 09/16/2009  . Essential hypertension 06/23/2009  . Personal history of colonoc adenomas 12/12/2008    Past Surgical History:  Procedure Laterality Date  . ABDOMINAL HYSTERECTOMY    . CHOLECYSTECTOMY    . COLONOSCOPY  12/19/2008   diverticulosis  . COLONOSCOPY  11/17/2011   Procedure: COLONOSCOPY;  Surgeon: Zenovia Jarred, MD;  Location: [facility];  Service: Gastroenterology;  Laterality: N/A;  . DILATION AND CURETTAGE OF UTERUS N/A 07/11/2014   Procedure: Repair of Vaginal Cuff;  Surgeon: Melina Schools, MD;  Location: [facility];  Service: Gynecology;  Laterality: N/A;  . FLEXIBLE SIGMOIDOSCOPY  12/12/2008   diverticulosis  . HAND SURGERY     x5 right  . HYDRADENITIS EXCISION Right 03/21/2014   Procedure: EXCISION HIDRADENITIS AXILLA;  Surgeon: Pedro Earls, MD;  Location: [facility];  Service: General;  Laterality: Right;  . KNEE ARTHROSCOPY     x2 left  . NEPHRECTOMY     left  . TRIGGER FINGER RELEASE Left 06/23/2015   Procedure: RELEASE TRIGGER  FINGER/A-1 PULLEY LEFT RING FINGER;  Surgeon: Daryll Brod, MD;  Location: [facility];  Service: Orthopedics;  Laterality: Left;  . TUBAL LIGATION    . UPPER GASTROINTESTINAL ENDOSCOPY  12/12/2008   gastroporesis    OB History    Gravida Para Term Preterm AB Living   3 3       3    SAB TAB Ectopic Multiple Live Births                   Home Medications    Prior to Admission medications   Medication Sig Start Date End Date Taking? Authorizing  Provider  amLODipine (NORVASC) 5 MG tablet Take 1 tablet (5 mg total) by mouth 2 (two) times daily. 08/11/16   Dron Tanna Furry, MD  Blood Glucose Monitoring Suppl (ONE TOUCH ULTRA MINI) W/DEVICE KIT Use to test blood sugar daily. Dx: E11.65 07/01/15   Philemon Kingdom, MD  glucose blood (ONE TOUCH ULTRA TEST) test strip Use to test blood sugar 2 times daily as instructed. Dx: E11.65 07/01/15   Philemon Kingdom, MD  Insulin Syringe-Needle U-100 (RELION INSULIN SYR 0.5ML/31G) 31G X 5/16" 0.5 ML MISC USE ONE SYRINGE TO INJECT INSULIN TWICE DAILY AS INSTRUCTED 03/21/16   Philemon Kingdom, MD  lisinopril (PRINIVIL,ZESTRIL) 40 MG tablet Take 1 tablet (40 mg total) by mouth daily. 08/12/16   Dron Tanna Furry, MD  metoprolol tartrate (LOPRESSOR) 25 MG tablet Take 0.5 tablets (12.5 mg total) by mouth 2 (two) times daily. 08/11/16   Dron Tanna Furry, MD  NOVOLOG MIX 70/30 (70-30) 100 UNIT/ML injection Inject 20-35 Units into the skin 2 (two) times daily with a meal. Inject 35 units in the am and Inject 20 units in the evening. 07/17/16   Historical Provider, MD  ondansetron (ZOFRAN) 4 MG tablet Take 1 tab every 6 hours as needed for nausea. 08/11/16   Dron Tanna Furry, MD  [facility] DELICA LANCETS FINE MISC Use to test blood sugar 2 times daily. Dx: E11.65 07/01/15   Philemon Kingdom, MD  pantoprazole (PROTONIX) 40 MG tablet Take 1 tablet (40 mg total) by mouth daily. 08/11/16   Dron Tanna Furry, MD    Family History Family History  Problem Relation Age of Onset  . Colon polyps Mother   . Diabetes Maternal Grandmother   . Breast cancer Maternal Grandmother   . Diabetes Father   . Breast cancer Daughter   . Colon cancer Neg Hx     Social History Social History  Substance Use Topics  . Smoking status: Never Smoker  . Smokeless tobacco: Never Used  . Alcohol use No     Allergies   Patient has no known allergies.   Review of Systems Review of Systems  Constitutional:       Per HPI,  otherwise negative  HENT:       Per HPI, otherwise negative  Respiratory:       Per HPI, otherwise negative  Cardiovascular:       Per HPI, otherwise negative  Gastrointestinal: Positive for abdominal pain and nausea. Negative for vomiting.  Endocrine:       Negative aside from HPI  Genitourinary:       Neg aside from HPI   Musculoskeletal:       Per HPI, otherwise negative  Skin: Negative.   Neurological: Negative for syncope.     Physical Exam Updated Vital Signs BP 166/92 (BP Location: Right Arm)   Pulse 94   Temp 98.5 F (  36.9 C) (Oral)   Resp 18   Ht 5' 2"  (1.575 m)   Wt 221 lb (100.2 kg)   LMP 11/16/2002   SpO2 99%   BMI 40.42 kg/m   Physical Exam  Constitutional: She is oriented to person, place, and time. She appears well-developed and well-nourished. No distress.  HENT:  Head: Normocephalic and atraumatic.  Eyes: Conjunctivae and EOM are normal.  Cardiovascular: Normal rate and regular rhythm.   Pulmonary/Chest: Effort normal and breath sounds normal. No stridor. No respiratory distress.  Abdominal: She exhibits no distension.  Tenderness and guarding throughout the right lower quadrant  Musculoskeletal: She exhibits no edema.  Neurological: She is alert and oriented to person, place, and time. No cranial nerve deficit.  Skin: Skin is warm and dry.  Psychiatric: She has a normal mood and affect.  Nursing note and vitals reviewed.    ED Treatments / Results  Labs (all labs ordered are listed, but only abnormal results are displayed) Labs Reviewed  URINALYSIS, ROUTINE W REFLEX MICROSCOPIC - Abnormal; Notable for the following:       Result Value   APPearance CLOUDY (*)    Protein, ur 100 (*)    All other components within normal limits  COMPREHENSIVE METABOLIC PANEL - Abnormal; Notable for the following:    Glucose, Bld 152 (*)    BUN 31 (*)    Creatinine, Ser 1.08 (*)    Albumin 3.1 (*)    GFR calc non Af Amer 55 (*)    All other components  within normal limits  URINALYSIS, MICROSCOPIC (REFLEX) - Abnormal; Notable for the following:    Bacteria, UA MANY (*)    Squamous Epithelial / LPF 6-30 (*)    All other components within normal limits  LIPASE, BLOOD  CBC    Radiology Ct Abdomen Pelvis Wo Contrast  Result Date: 12/21/2016 CLINICAL DATA:  Right lower quadrant and right flank pain EXAM: CT ABDOMEN AND PELVIS WITHOUT CONTRAST TECHNIQUE: Multidetector CT imaging of the abdomen and pelvis was performed following the standard protocol without IV contrast. Oral contrast was administered. COMPARISON:  August 05, 2016 FINDINGS: Lower chest: There is mild bibasilar atelectasis. There is no lung base edema or consolidation. Hepatobiliary: No focal liver lesions are evident on this non intravenous contrast enhanced study. Gallbladder is absent. There is no biliary duct dilatation. Pancreas: No pancreatic mass or inflammatory focus. Spleen: No splenic lesions are evident. Adrenals/Urinary Tract: Adrenals appear unremarkable. Patient is status post left nephrectomy. No lesion is evident in the left pararenal fossa region. Right kidney shows mild compensatory hypertrophy. There is no right renal mass or hydronephrosis. There is no right-sided renal or ureteral calculus. Urinary bladder is midline with wall thickness within normal limits. Stomach/Bowel: There are multiple sigmoid diverticula without diverticulitis. There it are also diverticulum in the cecum and proximal at ascending colon without appreciable diverticulitis currently. There are surgical clips medial to the cecum, unchanged from prior study. There is no appreciable bowel wall or mesenteric thickening. No bowel obstruction. No free air or portal venous air. Vascular/Lymphatic: There is no appreciable abdominal aortic aneurysm. No vascular lesions are evident on this noncontrast enhanced study. There is no demonstrable adenopathy abdomen or pelvis. Reproductive: Uterus absent. There is  no pelvic mass or pelvic fluid collection. Other: The appendix appears normal. No abscess or ascites evident in the abdomen or pelvis. There is a focal ventral hernia, primarily located slightly to the right of midline which contains fat but no bowel.  This finding appears stable compared to prior study. Musculoskeletal: There are no blastic or lytic bone lesions. There is degenerative change in lumbar spine and sacroiliac joints. No blastic or lytic bone lesions. No intramuscular or abdominal wall lesion. IMPRESSION: There is sigmoid and ascending colonic diverticulosis without frank diverticulitis. No appreciable mesenteric or bowel wall thickening evident. No bowel obstruction. No abscess. Appendix appears normal. Evidence of prior postoperative change in the right lower quadrant region. Status post left nephrectomy. Compensatory hypertrophy right kidney. No right renal mass or hydronephrosis. No renal or ureteral calculus on the right. Gallbladder and uterus absent. Ventral hernia containing only fat. Electronically Signed   By: Lowella Grip III M.D.   On: 12/21/2016 11:29    Procedures Procedures (including critical care time)  Medications Ordered in ED Medications  sodium chloride 0.9 % bolus 500 mL (not administered)  morphine 4 MG/ML injection 4 mg (not administered)     Initial Impression / Assessment and Plan / ED Course  I have reviewed the triage vital signs and the nursing notes.  Pertinent labs & imaging results that were available during my care of the patient were reviewed by me and considered in my medical decision making (see chart for details).   12:55 PM On repeat exam the patient is awake and alert, in no distress, has diminished pain. We discussed all findings at length including reassuring CT results, labs. Given the persistency of her discomfort the past weeks, with reassuring results, there suspicion for musculoskeletal etiology, though passed kidney stone is also a  consideration. With no evidence for ongoing infection, obstruction, mass, no distress, no bacteremia, sepsis, patient discharged in stable condition to follow-up with outpatient providers.   Final Clinical Impressions(s) / ED Diagnoses  Abdominal pain   Carmin Muskrat, MD 12/21/16 [id number]

## 2016-12-21 NOTE — ED Triage Notes (Signed)
Patient is complaining of right back pain. Patient states she has only one kidney. Patient is hurting on the side that has the only one left. Patient states she is in excruciating pain. Patient states she is not having any trouble urinating.

## 2016-12-21 NOTE — Discharge Instructions (Signed)
As discussed, your evaluation today has been largely reassuring.  But, it is important that you monitor your condition carefully, and do not hesitate to return to the ED if you develop new, or concerning changes in your condition. ? ?Otherwise, please follow-up with your physician for appropriate ongoing care. ? ?

## 2017-01-09 DIAGNOSIS — E1121 Type 2 diabetes mellitus with diabetic nephropathy: Secondary | ICD-10-CM | POA: Diagnosis not present

## 2017-01-09 DIAGNOSIS — Z905 Acquired absence of kidney: Secondary | ICD-10-CM | POA: Diagnosis not present

## 2017-01-09 DIAGNOSIS — I10 Essential (primary) hypertension: Secondary | ICD-10-CM | POA: Diagnosis not present

## 2017-01-09 DIAGNOSIS — Z6841 Body Mass Index (BMI) 40.0 and over, adult: Secondary | ICD-10-CM | POA: Diagnosis not present

## 2017-01-09 DIAGNOSIS — E1144 Type 2 diabetes mellitus with diabetic amyotrophy: Secondary | ICD-10-CM | POA: Diagnosis not present

## 2017-02-23 DIAGNOSIS — Z794 Long term (current) use of insulin: Secondary | ICD-10-CM | POA: Diagnosis not present

## 2017-02-23 DIAGNOSIS — N182 Chronic kidney disease, stage 2 (mild): Secondary | ICD-10-CM | POA: Diagnosis present

## 2017-02-23 DIAGNOSIS — I10 Essential (primary) hypertension: Secondary | ICD-10-CM | POA: Diagnosis not present

## 2017-02-23 DIAGNOSIS — E1165 Type 2 diabetes mellitus with hyperglycemia: Secondary | ICD-10-CM | POA: Diagnosis not present

## 2017-02-23 DIAGNOSIS — E11319 Type 2 diabetes mellitus with unspecified diabetic retinopathy without macular edema: Secondary | ICD-10-CM | POA: Diagnosis not present

## 2017-02-23 DIAGNOSIS — Z6841 Body Mass Index (BMI) 40.0 and over, adult: Secondary | ICD-10-CM | POA: Diagnosis not present

## 2017-02-23 DIAGNOSIS — Z85528 Personal history of other malignant neoplasm of kidney: Secondary | ICD-10-CM | POA: Diagnosis not present

## 2017-02-23 DIAGNOSIS — E1121 Type 2 diabetes mellitus with diabetic nephropathy: Secondary | ICD-10-CM | POA: Diagnosis not present

## 2017-03-29 DIAGNOSIS — M19071 Primary osteoarthritis, right ankle and foot: Secondary | ICD-10-CM | POA: Diagnosis not present

## 2017-03-29 DIAGNOSIS — W19XXXA Unspecified fall, initial encounter: Secondary | ICD-10-CM | POA: Diagnosis not present

## 2017-03-29 DIAGNOSIS — E1165 Type 2 diabetes mellitus with hyperglycemia: Secondary | ICD-10-CM | POA: Diagnosis not present

## 2017-03-29 DIAGNOSIS — M7732 Calcaneal spur, left foot: Secondary | ICD-10-CM | POA: Diagnosis not present

## 2017-03-29 DIAGNOSIS — R600 Localized edema: Secondary | ICD-10-CM | POA: Diagnosis not present

## 2017-03-29 DIAGNOSIS — M7989 Other specified soft tissue disorders: Secondary | ICD-10-CM | POA: Diagnosis not present

## 2017-03-29 DIAGNOSIS — M19072 Primary osteoarthritis, left ankle and foot: Secondary | ICD-10-CM | POA: Diagnosis not present

## 2017-03-29 DIAGNOSIS — S99911A Unspecified injury of right ankle, initial encounter: Secondary | ICD-10-CM | POA: Diagnosis not present

## 2017-03-29 DIAGNOSIS — N182 Chronic kidney disease, stage 2 (mild): Secondary | ICD-10-CM | POA: Diagnosis not present

## 2017-03-29 DIAGNOSIS — S9782XA Crushing injury of left foot, initial encounter: Secondary | ICD-10-CM | POA: Diagnosis not present

## 2017-03-29 DIAGNOSIS — Z794 Long term (current) use of insulin: Secondary | ICD-10-CM | POA: Diagnosis not present

## 2017-03-29 DIAGNOSIS — I10 Essential (primary) hypertension: Secondary | ICD-10-CM | POA: Diagnosis not present

## 2017-03-29 DIAGNOSIS — E11319 Type 2 diabetes mellitus with unspecified diabetic retinopathy without macular edema: Secondary | ICD-10-CM | POA: Diagnosis not present

## 2017-03-29 DIAGNOSIS — M7731 Calcaneal spur, right foot: Secondary | ICD-10-CM | POA: Diagnosis not present

## 2017-06-06 DIAGNOSIS — H43812 Vitreous degeneration, left eye: Secondary | ICD-10-CM | POA: Diagnosis not present

## 2017-06-06 DIAGNOSIS — H2513 Age-related nuclear cataract, bilateral: Secondary | ICD-10-CM | POA: Diagnosis not present

## 2017-06-06 DIAGNOSIS — E113393 Type 2 diabetes mellitus with moderate nonproliferative diabetic retinopathy without macular edema, bilateral: Secondary | ICD-10-CM | POA: Diagnosis not present

## 2017-08-31 DIAGNOSIS — R69 Illness, unspecified: Secondary | ICD-10-CM | POA: Diagnosis not present

## 2017-08-31 DIAGNOSIS — E1121 Type 2 diabetes mellitus with diabetic nephropathy: Secondary | ICD-10-CM | POA: Diagnosis not present

## 2017-08-31 DIAGNOSIS — I10 Essential (primary) hypertension: Secondary | ICD-10-CM | POA: Diagnosis not present

## 2017-11-14 DIAGNOSIS — I619 Nontraumatic intracerebral hemorrhage, unspecified: Secondary | ICD-10-CM

## 2017-11-14 HISTORY — DX: Nontraumatic intracerebral hemorrhage, unspecified: I61.9

## 2017-12-15 DIAGNOSIS — I639 Cerebral infarction, unspecified: Secondary | ICD-10-CM

## 2017-12-15 HISTORY — DX: Cerebral infarction, unspecified: I63.9

## 2017-12-29 ENCOUNTER — Encounter (HOSPITAL_BASED_OUTPATIENT_CLINIC_OR_DEPARTMENT_OTHER): Payer: Self-pay | Admitting: Emergency Medicine

## 2017-12-29 ENCOUNTER — Emergency Department (HOSPITAL_BASED_OUTPATIENT_CLINIC_OR_DEPARTMENT_OTHER): Payer: Medicare Other

## 2017-12-29 ENCOUNTER — Emergency Department (HOSPITAL_BASED_OUTPATIENT_CLINIC_OR_DEPARTMENT_OTHER)
Admission: EM | Admit: 2017-12-29 | Discharge: 2017-12-29 | Disposition: A | Discharge status: Home / Self Care | Payer: Medicare Other | Attending: Emergency Medicine | Admitting: Emergency Medicine

## 2017-12-29 ENCOUNTER — Other Ambulatory Visit: Payer: Self-pay

## 2017-12-29 DIAGNOSIS — Z794 Long term (current) use of insulin: Secondary | ICD-10-CM | POA: Diagnosis not present

## 2017-12-29 DIAGNOSIS — I129 Hypertensive chronic kidney disease with stage 1 through stage 4 chronic kidney disease, or unspecified chronic kidney disease: Secondary | ICD-10-CM | POA: Diagnosis not present

## 2017-12-29 DIAGNOSIS — N182 Chronic kidney disease, stage 2 (mild): Secondary | ICD-10-CM | POA: Diagnosis not present

## 2017-12-29 DIAGNOSIS — Z79899 Other long term (current) drug therapy: Secondary | ICD-10-CM | POA: Insufficient documentation

## 2017-12-29 DIAGNOSIS — E119 Type 2 diabetes mellitus without complications: Secondary | ICD-10-CM | POA: Diagnosis not present

## 2017-12-29 DIAGNOSIS — R1031 Right lower quadrant pain: Secondary | ICD-10-CM | POA: Diagnosis not present

## 2017-12-29 HISTORY — DX: Major depressive disorder, single episode, unspecified: F32.9

## 2017-12-29 HISTORY — DX: Depression, unspecified: F32.A

## 2017-12-29 LAB — COMPREHENSIVE METABOLIC PANEL
ALK PHOS: 89 U/L (ref 38–126)
ALT: 19 U/L (ref 14–54)
AST: 23 U/L (ref 15–41)
Albumin: 3 g/dL — ABNORMAL LOW (ref 3.5–5.0)
Anion gap: 9 (ref 5–15)
BILIRUBIN TOTAL: 0.2 mg/dL — AB (ref 0.3–1.2)
BUN: 26 mg/dL — AB (ref 6–20)
CALCIUM: 9.1 mg/dL (ref 8.9–10.3)
CO2: 28 mmol/L (ref 22–32)
Chloride: 103 mmol/L (ref 101–111)
Creatinine, Ser: 1.12 mg/dL — ABNORMAL HIGH (ref 0.44–1.00)
GFR calc Af Amer: >60 mL/min (ref >60)
GFR, EST NON AFRICAN AMERICAN: 52 mL/min — AB (ref >60)
GLUCOSE: 95 mg/dL (ref 65–99)
POTASSIUM: 3.9 mmol/L (ref 3.5–5.1)
Sodium: 140 mmol/L (ref 135–145)
TOTAL PROTEIN: 7.6 g/dL (ref 6.5–8.1)

## 2017-12-29 LAB — URINALYSIS, ROUTINE W REFLEX MICROSCOPIC
Bilirubin Urine: NEGATIVE
Glucose, UA: NEGATIVE mg/dL
KETONES UR: NEGATIVE mg/dL
LEUKOCYTES UA: NEGATIVE
NITRITE: NEGATIVE
PH: 6.5 (ref 5.0–8.0)
PROTEIN: 100 mg/dL — AB
Specific Gravity, Urine: 1.015 (ref 1.005–1.030)

## 2017-12-29 LAB — CBC WITH DIFFERENTIAL/PLATELET
BASOS ABS: 0 10*3/uL (ref 0.0–0.1)
BASOS PCT: 0 %
EOS ABS: 0.1 10*3/uL (ref 0.0–0.7)
EOS PCT: 1 %
HCT: 40.1 % (ref 36.0–46.0)
Hemoglobin: 12.9 g/dL (ref 12.0–15.0)
LYMPHS PCT: 35 %
Lymphs Abs: 3 10*3/uL (ref 0.7–4.0)
MCH: 28 pg (ref 26.0–34.0)
MCHC: 32.2 g/dL (ref 30.0–36.0)
MCV: 87.2 fL (ref 78.0–100.0)
MONO ABS: 0.7 10*3/uL (ref 0.1–1.0)
Monocytes Relative: 8 %
Neutro Abs: 4.7 10*3/uL (ref 1.7–7.7)
Neutrophils Relative %: 56 %
PLATELETS: 252 10*3/uL (ref 150–400)
RBC: 4.6 MIL/uL (ref 3.87–5.11)
RDW: 14.5 % (ref 11.5–15.5)
WBC: 8.4 10*3/uL (ref 4.0–10.5)

## 2017-12-29 LAB — URINALYSIS, MICROSCOPIC (REFLEX)

## 2017-12-29 LAB — LIPASE, BLOOD: LIPASE: 78 U/L — AB (ref 11–51)

## 2017-12-29 MED ORDER — MORPHINE SULFATE (PF) 4 MG/ML IV SOLN
4.0000 mg | Freq: Once | INTRAVENOUS | Status: AC
  Administered 2017-12-29: 4 mg via INTRAVENOUS
  Filled 2017-12-29: qty 1

## 2017-12-29 MED ORDER — IOPAMIDOL (ISOVUE-300) INJECTION 61%
100.0000 mL | Freq: Once | INTRAVENOUS | Status: AC | PRN
  Administered 2017-12-29: 100 mL via INTRAVENOUS

## 2017-12-29 MED ORDER — SODIUM CHLORIDE 0.9 % IV BOLUS (SEPSIS)
500.0000 mL | Freq: Once | INTRAVENOUS | Status: AC
  Administered 2017-12-29: 500 mL via INTRAVENOUS

## 2017-12-29 NOTE — Discharge Instructions (Signed)
The cause of your pain was not identified today.  Please follow-up with your doctor for recheck.  You can follow up with the surgeon regarding your hernia.

## 2017-12-29 NOTE — ED Triage Notes (Signed)
RLQ pain x3 weeks.  Intermittent.  7/10 during the day.  10/10 at night.  Worse if she lays on her left side. Decrease appetite.  Decrease BMs.  No vomiting or diarrhea.

## 2017-12-29 NOTE — ED Notes (Signed)
ED Provider at bedside. 

## 2017-12-29 NOTE — ED Notes (Signed)
Pt called in waiting room. Registration advised pt is in restroom.

## 2017-12-29 NOTE — ED Provider Notes (Signed)
[facility] EMERGENCY DEPARTMENT Provider Note   CSN: [id number] Arrival date & time: 12/29/17  0932     History   Chief Complaint Chief Complaint  Patient presents with  . Abdominal Pain    HPI [name] is a 62 y.o. female.  The history is provided by the patient. No language interpreter was used.  Abdominal Pain      [name] is a 62 y.o. female who presents to the Emergency Department complaining of RLQ abdominal pain.  She reports three weeks of RLQ pain with nausea and vomiting (only episode of vomiting occurred three weeks ago but nausea is persistent).  She has associated decreased BMs (only two in the last week).  No fevers but she has chills.  Pain is described as sharp and aching, worse with walking.  Pain is constant in nature, worse at night, nonradiating.  Denies dysuria.  Pain is moderate to severe.    Past Medical History:  Diagnosis Date  . Anemia   . Blood transfusion   . Blood transfusion without reported diagnosis   . CKD (chronic kidney disease) stage 2, GFR 60-89 ml/min   . Depression   . Diabetes mellitus   . Diverticulosis of colon with hemorrhage 2010, 09/2011  . H/O hiatal hernia   . Helicobacter pylori gastritis 09/13/2013  . Hx: UTI (urinary tract infection)   . Hypertension   . IBS (irritable bowel syndrome)   . Personal history of colonic polyps    adenomas since 2010  . Renal cell carcinoma 2004   lt kidney removed    Patient Active Problem List   Diagnosis Date Noted  . Sepsis ([place]) 08/06/2016  . Diverticulitis 08/06/2016  . CKD (chronic kidney disease) stage 2, GFR 60-89 ml/min   . Diverticulitis of intestine without perforation or abscess without bleeding   . Neuralgia of chest 12/30/2015  . Atypical chest pain 12/01/2015  . Physical exam 10/26/2015  . 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 Maxillary  sinusitis, acute 01/21/2015  . Strain of left pectoralis muscle 09/26/2014  . Benign paroxysmal positional vertigo 10/03/2013  . Nausea with vomiting 10/03/2013  . Bleeding nose 10/03/2013  . Helicobacter pylori gastritis 09/13/2013  . Pyuria 08/20/2013  . Abnormal abdominal CT scan 08/20/2013  . Abdominal pain 08/16/2013  . Back pain 07/31/2013  . LUQ pain 07/31/2013  . Laceration of hand 07/10/2013  . Hidradenitis suppurativa of right axilla 05/31/2013  . Renal cell carcinoma   . IBS (irritable bowel syndrome)   . S/p nephrectomy   . 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 OBESITY, MORBID 09/16/2009  . Essential hypertension 06/23/2009  . Personal history of colonoc adenomas 12/12/2008    Past Surgical History:  Procedure Laterality Date  . ABDOMINAL HYSTERECTOMY    . ABDOMINAL HYSTERECTOMY    . CHOLECYSTECTOMY    . COLONOSCOPY  12/19/2008   diverticulosis  . COLONOSCOPY  11/17/2011   Procedure: COLONOSCOPY;  Surgeon: Zenovia Jarred, MD;  Location: [facility];  Service: Gastroenterology;  Laterality: N/A;  . DILATION AND CURETTAGE OF UTERUS N/A 07/11/2014   Procedure: Repair of Vaginal Cuff;  Surgeon: Melina Schools, MD;  Location: [facility];  Service: Gynecology;  Laterality: N/A;  . FLEXIBLE SIGMOIDOSCOPY  12/12/2008   diverticulosis  . HAND SURGERY     x5 right  . HYDRADENITIS EXCISION Right 03/21/2014  Procedure: EXCISION HIDRADENITIS AXILLA;  Surgeon: Pedro Earls, MD;  Location: [facility];  Service: General;  Laterality: Right;  . KNEE ARTHROSCOPY     x2 left  . NEPHRECTOMY     left  . TRIGGER FINGER RELEASE Left 06/23/2015   Procedure: RELEASE TRIGGER FINGER/A-1 PULLEY LEFT RING FINGER;  Surgeon: Daryll Brod, MD;  Location: [facility];  Service: Orthopedics;  Laterality: Left;  . TUBAL LIGATION    . UPPER GASTROINTESTINAL ENDOSCOPY  12/12/2008   gastroporesis    OB History    Gravida Para Term  Preterm AB Living   _0 SAB TAB Ectopic Multiple Live Births                   Home Medications    Prior to Admission medications   Medication Sig Start Date End Date Taking? Authorizing Provider  amLODipine (NORVASC) 5 MG tablet Take 1 tablet (5 mg total) by mouth 2 (two) times daily. Patient not taking: Reported on 12/21/2016 08/11/16   Rosita Fire, MD  Blood Glucose Monitoring Suppl (ONE TOUCH ULTRA MINI) W/DEVICE KIT Use to test blood sugar daily. Dx: E11.65 07/01/15   Philemon Kingdom, MD  glucose blood (ONE TOUCH ULTRA TEST) test strip Use to test blood sugar 2 times daily as instructed. Dx: E11.65 07/01/15   Philemon Kingdom, MD  Insulin Syringe-Needle U-100 (RELION INSULIN SYR 0.5ML/31G) 31G X 5/16" 0.5 ML MISC USE ONE SYRINGE TO INJECT INSULIN TWICE DAILY AS INSTRUCTED 03/21/16   Philemon Kingdom, MD  lisinopril (PRINIVIL,ZESTRIL) 40 MG tablet Take 1 tablet (40 mg total) by mouth daily. Patient not taking: Reported on 12/21/2016 08/12/16   Rosita Fire, MD  lisinopril-hydrochlorothiazide (PRINZIDE,ZESTORETIC) 20-12.5 MG tablet Take 2 tablets by mouth daily. 07/19/16   [provider]  metoprolol tartrate (LOPRESSOR) 25 MG tablet Take 0.5 tablets (12.5 mg total) by mouth 2 (two) times daily. Patient not taking: Reported on 12/21/2016 08/11/16   Rosita Fire, MD  NOVOLOG MIX 70/30 (70-30) 100 UNIT/ML injection Inject 20-35 Units into the skin 2 (two) times daily with a meal. Inject 35 units in the am and Inject 20 units in the evening. 07/17/16   [provider]  ondansetron (ZOFRAN) 4 MG tablet Take 1 tab every 6 hours as needed for nausea. Patient not taking: Reported on 12/21/2016 08/11/16   Rosita Fire, MD  [facility] DELICA LANCETS FINE MISC Use to test blood sugar 2 times daily. Dx: E11.65 07/01/15   Philemon Kingdom, MD  pantoprazole (PROTONIX) 40 MG tablet Take 1 tablet (40 mg total) by mouth daily. Patient not taking: Reported  on 12/21/2016 08/11/16   Rosita Fire, MD  ramipril (ALTACE) 10 MG tablet Take 10 mg by mouth daily.   10/05/12  [provider]  sitaGLIPtan-metformin (JANUMET) 50-500 MG per tablet Take 1 tablet by mouth 2 (two) times daily with a meal. 09/22/11 10/05/12  Ernest Haber, MD    Family History Family History  Problem Relation Age of Onset  . Colon polyps Mother   . Diabetes Maternal Grandmother   . Breast cancer Maternal Grandmother   . Diabetes Father   . Breast cancer Daughter   . Colon cancer Neg Hx     Social History Social History   Tobacco Use  . Smoking status: Never Smoker  . Smokeless tobacco: Never Used  Substance Use Topics  . Alcohol use: No  Alcohol/week: 0.0 oz  . Drug use: No     Allergies   Patient has no known allergies.   Review of Systems Review of Systems  Gastrointestinal: Positive for abdominal pain.  All other systems reviewed and are negative.    Physical Exam Updated Vital Signs BP (!) 186/98 (BP Location: Left Arm)   Pulse 93   Temp 98.8 F (37.1 C) (Oral)   Resp 18   Ht _0  (1.549 m)   Wt 99.8 kg (220 lb)   LMP 11/16/2002   SpO2 99%   BMI 41.57 kg/m   Physical Exam  Constitutional: She is oriented to person, place, and time. She appears well-developed and well-nourished.  HENT:  Head: Normocephalic and atraumatic.  Cardiovascular: Normal rate and regular rhythm.  No murmur heard. Pulmonary/Chest: Effort normal and breath sounds normal. No respiratory distress.  Abdominal: Soft.  Obese abdomen.  Lower abdominal midline scar, well healed.  Right sided periumbilical firm mass about 2-3 cm in diameter - nontender.  Moderate RLQ abdominal tenderness, no guarding or rebound tenderness.   Musculoskeletal: She exhibits no edema or tenderness.  Neurological: She is alert and oriented to person, place, and time.  Skin: Skin is warm and dry.  Psychiatric: She has a normal mood and affect. Her behavior is normal.    Nursing note and vitals reviewed.    ED Treatments / Results  Labs (all labs ordered are listed, but only abnormal results are displayed) Labs Reviewed  COMPREHENSIVE METABOLIC PANEL - Abnormal; Notable for the following components:      Result Value   BUN 26 (*)    Creatinine, Ser 1.12 (*)    Albumin 3.0 (*)    Total Bilirubin 0.2 (*)    GFR calc non Af Amer 52 (*)    All other components within normal limits  LIPASE, BLOOD - Abnormal; Notable for the following components:   Lipase 78 (*)    All other components within normal limits  URINALYSIS, ROUTINE W REFLEX MICROSCOPIC - Abnormal; Notable for the following components:   Hgb urine dipstick SMALL (*)    Protein, ur 100 (*)    All other components within normal limits  URINALYSIS, MICROSCOPIC (REFLEX) - Abnormal; Notable for the following components:   Bacteria, UA RARE (*)    Squamous Epithelial / LPF 0-5 (*)    All other components within normal limits  URINE CULTURE  CBC WITH DIFFERENTIAL/PLATELET    EKG  EKG Interpretation None       Radiology Ct Abdomen Pelvis W Contrast  Result Date: 12/29/2017 CLINICAL DATA:  Right lower quadrant pain for 3 weeks. History of left nephrectomy for renal cell cancer. EXAM: CT ABDOMEN AND PELVIS WITH CONTRAST TECHNIQUE: Multidetector CT imaging of the abdomen and pelvis was performed using the standard protocol following bolus administration of intravenous contrast. CONTRAST:  188m ISOVUE-300 IOPAMIDOL (ISOVUE-300) INJECTION 61% COMPARISON:  12/21/2016 FINDINGS: Lower chest: No acute abnormality. Hepatobiliary: No focal liver abnormality is seen. Status post cholecystectomy. No biliary dilatation. Pancreas: Unremarkable. No pancreatic ductal dilatation or surrounding inflammatory changes. Spleen: Normal in size without focal abnormality. Adrenals/Urinary Tract: Normal right adrenal gland. Stable 11 mm circumscribed left adrenal nodule. Post left nephrectomy. Normal appearance of the  right kidney. Stomach/Bowel: Stomach is within normal limits. Postsurgical changes medial to the cecum, stable. No evidence of bowel wall thickening, distention, or inflammatory changes. Normal appendix. Diffusely scattered colonic diverticulosis without evidence of diverticulitis. Prominent right colonic diverticula noted. Vascular/Lymphatic: No significant vascular  findings are present. No enlarged abdominal or pelvic lymph nodes. Reproductive: Status post hysterectomy. No adnexal masses. Other: Fat containing right paramedian infraumbilical anterior abdominal wall hernia. No abdominopelvic ascites. Musculoskeletal: Mild posterior facet arthropathy of the lumbosacral spine. IMPRESSION: No evidence of acute abnormalities within the abdomen or pelvis. Colonic diverticulosis without evidence of diverticulitis, with prominent cecal diverticula. Fat containing right paramedian infraumbilical anterior abdominal wall hernia. Electronically Signed   By: Fidela Salisbury M.D.   On: 12/29/2017 13:25    Procedures Procedures (including critical care time)  Medications Ordered in ED Medications  sodium chloride 0.9 % bolus 500 mL (500 mLs Intravenous New Bag/Given 12/29/17 1134)  morphine 4 MG/ML injection 4 mg (4 mg Intravenous Given 12/29/17 1134)  iopamidol (ISOVUE-300) 61 % injection 100 mL (100 mLs Intravenous Contrast Given 12/29/17 1250)     Initial Impression / Assessment and Plan / ED Course  I have reviewed the triage vital signs and the nursing notes.  Pertinent labs & imaging results that were available during my care of the patient were reviewed by me and considered in my medical decision making (see chart for details).     Pt here for three weeks of RLQ abdominal pain.  She has pain on exam as well as periumbilical hernia.  Hernia is not in the location of her tenderness.  No evidence of acute uti.  CT abdomen obtained with no evidence of diverticulitis, obstruction, mass.  D/w pt home care  for abdominal pain, unclear etiology.  Discussed outpatient follow up and return precautions.    Final Clinical Impressions(s) / ED Diagnoses   Final diagnoses:  Right lower quadrant abdominal pain    ED Discharge Orders    None       Quintella Reichert, MD 12/29/17 1646

## 2017-12-30 LAB — URINE CULTURE: CULTURE: NO GROWTH

## 2018-01-04 ENCOUNTER — Other Ambulatory Visit: Payer: Self-pay

## 2018-01-04 ENCOUNTER — Emergency Department (HOSPITAL_COMMUNITY): Payer: Medicare Other

## 2018-01-04 ENCOUNTER — Encounter (HOSPITAL_COMMUNITY): Payer: Self-pay

## 2018-01-04 ENCOUNTER — Inpatient Hospital Stay (HOSPITAL_COMMUNITY)
Admission: EM | Admit: 2018-01-04 | Discharge: 2018-01-06 | DRG: 064 | Disposition: A | Discharge status: Home Health | Payer: Medicare Other | Attending: Neurology | Admitting: Neurology

## 2018-01-04 ENCOUNTER — Inpatient Hospital Stay (HOSPITAL_COMMUNITY): Payer: Medicare Other

## 2018-01-04 DIAGNOSIS — E1165 Type 2 diabetes mellitus with hyperglycemia: Secondary | ICD-10-CM | POA: Diagnosis present

## 2018-01-04 DIAGNOSIS — Z85528 Personal history of other malignant neoplasm of kidney: Secondary | ICD-10-CM | POA: Diagnosis not present

## 2018-01-04 DIAGNOSIS — Z9114 Patient's other noncompliance with medication regimen: Secondary | ICD-10-CM

## 2018-01-04 DIAGNOSIS — E669 Obesity, unspecified: Secondary | ICD-10-CM | POA: Diagnosis present

## 2018-01-04 DIAGNOSIS — R05 Cough: Secondary | ICD-10-CM

## 2018-01-04 DIAGNOSIS — G936 Cerebral edema: Secondary | ICD-10-CM | POA: Diagnosis present

## 2018-01-04 DIAGNOSIS — I129 Hypertensive chronic kidney disease with stage 1 through stage 4 chronic kidney disease, or unspecified chronic kidney disease: Secondary | ICD-10-CM | POA: Diagnosis present

## 2018-01-04 DIAGNOSIS — Z833 Family history of diabetes mellitus: Secondary | ICD-10-CM | POA: Diagnosis not present

## 2018-01-04 DIAGNOSIS — Z90710 Acquired absence of both cervix and uterus: Secondary | ICD-10-CM

## 2018-01-04 DIAGNOSIS — Z6841 Body Mass Index (BMI) 40.0 and over, adult: Secondary | ICD-10-CM | POA: Diagnosis not present

## 2018-01-04 DIAGNOSIS — E1122 Type 2 diabetes mellitus with diabetic chronic kidney disease: Secondary | ICD-10-CM | POA: Diagnosis present

## 2018-01-04 DIAGNOSIS — R29702 NIHSS score 2: Secondary | ICD-10-CM | POA: Diagnosis present

## 2018-01-04 DIAGNOSIS — Z794 Long term (current) use of insulin: Secondary | ICD-10-CM | POA: Diagnosis not present

## 2018-01-04 DIAGNOSIS — D649 Anemia, unspecified: Secondary | ICD-10-CM | POA: Diagnosis present

## 2018-01-04 DIAGNOSIS — Z905 Acquired absence of kidney: Secondary | ICD-10-CM | POA: Diagnosis not present

## 2018-01-04 DIAGNOSIS — I619 Nontraumatic intracerebral hemorrhage, unspecified: Secondary | ICD-10-CM | POA: Diagnosis present

## 2018-01-04 DIAGNOSIS — I161 Hypertensive emergency: Secondary | ICD-10-CM | POA: Diagnosis present

## 2018-01-04 DIAGNOSIS — I10 Essential (primary) hypertension: Secondary | ICD-10-CM

## 2018-01-04 DIAGNOSIS — R059 Cough, unspecified: Secondary | ICD-10-CM

## 2018-01-04 DIAGNOSIS — N182 Chronic kidney disease, stage 2 (mild): Secondary | ICD-10-CM | POA: Diagnosis present

## 2018-01-04 DIAGNOSIS — K589 Irritable bowel syndrome without diarrhea: Secondary | ICD-10-CM | POA: Diagnosis present

## 2018-01-04 DIAGNOSIS — E785 Hyperlipidemia, unspecified: Secondary | ICD-10-CM | POA: Diagnosis not present

## 2018-01-04 DIAGNOSIS — R296 Repeated falls: Secondary | ICD-10-CM | POA: Diagnosis present

## 2018-01-04 DIAGNOSIS — G8194 Hemiplegia, unspecified affecting left nondominant side: Secondary | ICD-10-CM | POA: Diagnosis present

## 2018-01-04 DIAGNOSIS — I610 Nontraumatic intracerebral hemorrhage in hemisphere, subcortical: Principal | ICD-10-CM | POA: Diagnosis present

## 2018-01-04 DIAGNOSIS — G935 Compression of brain: Secondary | ICD-10-CM | POA: Diagnosis present

## 2018-01-04 DIAGNOSIS — R531 Weakness: Secondary | ICD-10-CM | POA: Diagnosis present

## 2018-01-04 LAB — COMPREHENSIVE METABOLIC PANEL
ALBUMIN: 2.8 g/dL — AB (ref 3.5–5.0)
ALK PHOS: 83 U/L (ref 38–126)
ALT: 19 U/L (ref 14–54)
ANION GAP: 11 (ref 5–15)
AST: 32 U/L (ref 15–41)
BILIRUBIN TOTAL: 0.5 mg/dL (ref 0.3–1.2)
BUN: 14 mg/dL (ref 6–20)
CALCIUM: 8.9 mg/dL (ref 8.9–10.3)
CO2: 24 mmol/L (ref 22–32)
Chloride: 105 mmol/L (ref 101–111)
Creatinine, Ser: 1.09 mg/dL — ABNORMAL HIGH (ref 0.44–1.00)
GFR calc non Af Amer: 54 mL/min — ABNORMAL LOW (ref >60)
GLUCOSE: 108 mg/dL — AB (ref 65–99)
Potassium: 3.7 mmol/L (ref 3.5–5.1)
SODIUM: 140 mmol/L (ref 135–145)
TOTAL PROTEIN: 6.8 g/dL (ref 6.5–8.1)

## 2018-01-04 LAB — CBC WITH DIFFERENTIAL/PLATELET
Basophils Absolute: 0 10*3/uL (ref 0.0–0.1)
Basophils Relative: 0 %
EOS ABS: 0 10*3/uL (ref 0.0–0.7)
Eosinophils Relative: 0 %
HEMATOCRIT: 40.7 % (ref 36.0–46.0)
HEMOGLOBIN: 13.1 g/dL (ref 12.0–15.0)
LYMPHS ABS: 2.3 10*3/uL (ref 0.7–4.0)
Lymphocytes Relative: 23 %
MCH: 28.2 pg (ref 26.0–34.0)
MCHC: 32.2 g/dL (ref 30.0–36.0)
MCV: 87.5 fL (ref 78.0–100.0)
MONOS PCT: 5 %
Monocytes Absolute: 0.5 10*3/uL (ref 0.1–1.0)
NEUTROS PCT: 72 %
Neutro Abs: 7.3 10*3/uL (ref 1.7–7.7)
Platelets: 231 10*3/uL (ref 150–400)
RBC: 4.65 MIL/uL (ref 3.87–5.11)
RDW: 14.6 % (ref 11.5–15.5)
WBC: 10.2 10*3/uL (ref 4.0–10.5)

## 2018-01-04 LAB — URINALYSIS, ROUTINE W REFLEX MICROSCOPIC
BACTERIA UA: NONE SEEN
Bilirubin Urine: NEGATIVE
GLUCOSE, UA: 50 mg/dL — AB
Ketones, ur: NEGATIVE mg/dL
Leukocytes, UA: NEGATIVE
Nitrite: NEGATIVE
PH: 6 (ref 5.0–8.0)
Protein, ur: >=300 mg/dL — AB
Specific Gravity, Urine: 1.012 (ref 1.005–1.030)

## 2018-01-04 LAB — I-STAT TROPONIN, ED: Troponin i, poc: 0.01 ng/mL (ref 0.00–0.08)

## 2018-01-04 LAB — RAPID URINE DRUG SCREEN, HOSP PERFORMED
AMPHETAMINES: NOT DETECTED
BARBITURATES: NOT DETECTED
Benzodiazepines: NOT DETECTED
COCAINE: NOT DETECTED
OPIATES: NOT DETECTED
Tetrahydrocannabinol: NOT DETECTED

## 2018-01-04 LAB — ETHANOL

## 2018-01-04 LAB — MRSA PCR SCREENING: MRSA by PCR: NEGATIVE

## 2018-01-04 LAB — PROTIME-INR
INR: 0.97
Prothrombin Time: 12.8 seconds (ref 11.4–15.2)

## 2018-01-04 LAB — APTT: aPTT: 31 seconds (ref 24–36)

## 2018-01-04 MED ORDER — SODIUM CHLORIDE 0.9 % IV SOLN
100.0000 mL/h | INTRAVENOUS | Status: DC
  Administered 2018-01-04 – 2018-01-05 (×2): 100 mL/h via INTRAVENOUS

## 2018-01-04 MED ORDER — NICARDIPINE HCL IN NACL 20-0.86 MG/200ML-% IV SOLN
0.0000 mg/h | INTRAVENOUS | Status: DC
  Administered 2018-01-04 (×2): 12.5 mg/h via INTRAVENOUS
  Administered 2018-01-04: 5 mg/h via INTRAVENOUS
  Administered 2018-01-04: 10 mg/h via INTRAVENOUS
  Administered 2018-01-04: 5 mg/h via INTRAVENOUS
  Administered 2018-01-05 (×2): 15 mg/h via INTRAVENOUS
  Administered 2018-01-05: 5 mg/h via INTRAVENOUS
  Administered 2018-01-05 (×2): 10 mg/h via INTRAVENOUS
  Filled 2018-01-04: qty 400
  Filled 2018-01-04 (×8): qty 200

## 2018-01-04 MED ORDER — INSULIN ASPART 100 UNIT/ML ~~LOC~~ SOLN
0.0000 [IU] | Freq: Three times a day (TID) | SUBCUTANEOUS | Status: DC
  Administered 2018-01-05: 1 [IU] via SUBCUTANEOUS

## 2018-01-04 MED ORDER — LABETALOL HCL 5 MG/ML IV SOLN
20.0000 mg | Freq: Once | INTRAVENOUS | Status: AC
  Administered 2018-01-04: 20 mg via INTRAVENOUS
  Filled 2018-01-04: qty 4

## 2018-01-04 MED ORDER — IOPAMIDOL (ISOVUE-370) INJECTION 76%
INTRAVENOUS | Status: AC
  Administered 2018-01-04: 50 mL
  Filled 2018-01-04: qty 50

## 2018-01-04 MED ORDER — SODIUM CHLORIDE 0.9 % IV BOLUS (SEPSIS)
500.0000 mL | Freq: Once | INTRAVENOUS | Status: AC
  Administered 2018-01-04: 500 mL via INTRAVENOUS

## 2018-01-04 MED ORDER — ACETAMINOPHEN 325 MG PO TABS
650.0000 mg | ORAL_TABLET | ORAL | Status: DC | PRN
  Administered 2018-01-04 – 2018-01-05 (×4): 650 mg via ORAL
  Filled 2018-01-04 (×4): qty 2

## 2018-01-04 MED ORDER — ACETAMINOPHEN 160 MG/5ML PO SOLN: 650.0000 mg | ORAL | Status: DC | PRN

## 2018-01-04 MED ORDER — ACETAMINOPHEN 650 MG RE SUPP: 650.0000 mg | RECTAL | Status: DC | PRN

## 2018-01-04 MED ORDER — STROKE: EARLY STAGES OF RECOVERY BOOK
Freq: Once | Status: AC
  Administered 2018-01-04: 18:00:00
  Filled 2018-01-04 (×2): qty 1

## 2018-01-04 MED ORDER — PANTOPRAZOLE SODIUM 40 MG IV SOLR
40.0000 mg | Freq: Every day | INTRAVENOUS | Status: DC
  Filled 2018-01-04: qty 40

## 2018-01-04 MED ORDER — SENNOSIDES-DOCUSATE SODIUM 8.6-50 MG PO TABS
1.0000 | ORAL_TABLET | Freq: Two times a day (BID) | ORAL | Status: DC
  Filled 2018-01-04 (×4): qty 1

## 2018-01-04 MED ORDER — CLEVIDIPINE BUTYRATE 0.5 MG/ML IV EMUL
0.0000 mg/h | INTRAVENOUS | Status: DC
  Filled 2018-01-04: qty 50

## 2018-01-04 NOTE — ED Notes (Addendum)
Pt placed on bedpan. Will transport after finished voiding.

## 2018-01-04 NOTE — Progress Notes (Signed)
PT Cancellation Note  Patient Details Name: [name] MRN: [id number] DOB: [birth year]   Cancelled Treatment:    Reason Eval/Treat Not Completed: Active bedrest order   Tonja Jezewski B Jenni Thew 01/04/2018, 1:23 PM  Elwyn Reach, [place]

## 2018-01-04 NOTE — Progress Notes (Signed)
Patient refusing MRI even after extensive education on the importance and the possibility of a medication for anxiety. Pt is claustrophobic and refuses even still. Will notify MD. Dyane Dustman Rande Brunt, RN

## 2018-01-04 NOTE — Progress Notes (Signed)
OT Cancellation Note  Patient Details Name: [name] MRN: [id number] DOB: July 31, 1956   Cancelled Treatment:    Reason Eval/Treat Not Completed: Patient not medically ready.  Pt with bedrest orders.  Will reattempt.  [place], OTR/L [phone]   Lucille Passy M 01/04/2018, 1:24 PM

## 2018-01-04 NOTE — ED Triage Notes (Signed)
Pt arrives from home EMS with complaints of leg weakness and and multiple falls this morning. Pt denies injury from falls.  Denies back/leg pain. Pt States her legs just feel weak.   Hr 100 rr 18 spo2 98% RA cbg 109  #18 RAC

## 2018-01-04 NOTE — ED Notes (Addendum)
Andy in pharmacy called for BP medications. Velna Hatchet, RN to call report to [phone]

## 2018-01-04 NOTE — ED Provider Notes (Addendum)
[facility] EMERGENCY DEPARTMENT Provider Note   CSN: [id number] Arrival date & time: 01/04/18  0909     History   Chief Complaint Chief Complaint  Patient presents with  . Weakness    HPI [name] is a 62 y.o. female.  The history is provided by the patient and a relative. No language interpreter was used.  Weakness  Primary symptoms include focal weakness. This is a new problem. The current episode started 6 to 12 hours ago. The problem has not changed since onset.There was left lower extremity and left upper extremity focality noted. There has been no fever. Pertinent negatives include no shortness of breath, no vomiting and no confusion. There were no medications administered prior to arrival. Associated medical issues do not include a bleeding disorder.  Pt's Grandson reports he found pt in the floor this am.  He helped her up and to the bathroom and she fell again.  Pt complains  of her left leg being weak   Past Medical History:  Diagnosis Date  . Anemia   . Blood transfusion   . Blood transfusion without reported diagnosis   . CKD (chronic kidney disease) stage 2, GFR 60-89 ml/min   . Depression   . Diabetes mellitus   . Diverticulosis of colon with hemorrhage 2010, 09/2011  . H/O hiatal hernia   . Helicobacter pylori gastritis 09/13/2013  . Hx: UTI (urinary tract infection)   . Hypertension   . IBS (irritable bowel syndrome)   . Personal history of colonic polyps    adenomas since 2010  . Renal cell carcinoma 2004   lt kidney removed    Patient Active Problem List   Diagnosis Date Noted  . Sepsis ([place]) 08/06/2016  . Diverticulitis 08/06/2016  . CKD (chronic kidney disease) stage 2, GFR 60-89 ml/min   . Diverticulitis of intestine without perforation or abscess without bleeding   . Neuralgia of chest 12/30/2015  . Atypical chest pain 12/01/2015  . Physical exam 10/26/2015  . 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 Maxillary sinusitis, acute 01/21/2015  . Strain of left pectoralis muscle 09/26/2014  . Benign paroxysmal positional vertigo 10/03/2013  . Nausea with vomiting 10/03/2013  . Bleeding nose 10/03/2013  . Helicobacter pylori gastritis 09/13/2013  . Pyuria 08/20/2013  . Abnormal abdominal CT scan 08/20/2013  . Abdominal pain 08/16/2013  . Back pain 07/31/2013  . LUQ pain 07/31/2013  . Laceration of hand 07/10/2013  . Hidradenitis suppurativa of right axilla 05/31/2013  . Renal cell carcinoma   . IBS (irritable bowel syndrome)   . S/p nephrectomy   . 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 OBESITY, MORBID 09/16/2009  . Essential hypertension 06/23/2009  . Personal history of colonoc adenomas 12/12/2008    Past Surgical History:  Procedure Laterality Date  . ABDOMINAL HYSTERECTOMY    . ABDOMINAL HYSTERECTOMY    . CHOLECYSTECTOMY    . COLONOSCOPY  12/19/2008   diverticulosis  . COLONOSCOPY  11/17/2011   Procedure: COLONOSCOPY;  Surgeon: Zenovia Jarred, MD;  Location: [facility];  Service: Gastroenterology;  Laterality: N/A;  . DILATION AND CURETTAGE OF UTERUS N/A 07/11/2014   Procedure: Repair of Vaginal Cuff;  Surgeon: Melina Schools, MD;  Location: [facility];  Service: Gynecology;  Laterality: N/A;  . FLEXIBLE SIGMOIDOSCOPY  12/12/2008   diverticulosis  . HAND SURGERY  x5 right  . HYDRADENITIS EXCISION Right 03/21/2014   Procedure: EXCISION HIDRADENITIS AXILLA;  Surgeon: Pedro Earls, MD;  Location: [facility];  Service: General;  Laterality: Right;  . KNEE ARTHROSCOPY     x2 left  . NEPHRECTOMY     left  . TRIGGER FINGER RELEASE Left 06/23/2015   Procedure: RELEASE TRIGGER FINGER/A-1 PULLEY LEFT RING FINGER;  Surgeon: Daryll Brod, MD;  Location: [facility];  Service: Orthopedics;  Laterality: Left;  . TUBAL LIGATION    . UPPER  GASTROINTESTINAL ENDOSCOPY  12/12/2008   gastroporesis    OB History    Gravida Para Term Preterm AB Living   3 3       3    SAB TAB Ectopic Multiple Live Births                   Home Medications    Prior to Admission medications   Medication Sig Start Date End Date Taking? Authorizing Provider  Blood Glucose Monitoring Suppl (ONE TOUCH ULTRA MINI) W/DEVICE KIT Use to test blood sugar daily. Dx: E11.65 07/01/15  Yes Philemon Kingdom, MD  BYDUREON 2 MG PEN Inject 2 mg as directed once a week. sunday 11/30/17  Yes [provider]  glucose blood (ONE TOUCH ULTRA TEST) test strip Use to test blood sugar 2 times daily as instructed. Dx: E11.65 07/01/15  Yes Philemon Kingdom, MD  Insulin Syringe-Needle U-100 (RELION INSULIN SYR 0.5ML/31G) 31G X 5/16" 0.5 ML MISC USE ONE SYRINGE TO INJECT INSULIN TWICE DAILY AS INSTRUCTED 03/21/16  Yes Philemon Kingdom, MD  lisinopril-hydrochlorothiazide (PRINZIDE,ZESTORETIC) 20-12.5 MG tablet Take 2 tablets by mouth daily. 07/19/16  Yes [provider]  NOVOLOG MIX 70/30 (70-30) 100 UNIT/ML injection Inject 20-40 Units into the skin 2 (two) times daily with a meal. Inject 40 units in the am and Inject 20 units in the evening. 07/17/16  Yes [provider]  Jonetta Speak LANCETS FINE MISC Use to test blood sugar 2 times daily. Dx: E11.65 07/01/15  Yes Philemon Kingdom, MD  amLODipine (NORVASC) 5 MG tablet Take 1 tablet (5 mg total) by mouth 2 (two) times daily. Patient not taking: Reported on 12/21/2016 08/11/16   Rosita Fire, MD  lisinopril (PRINIVIL,ZESTRIL) 40 MG tablet Take 1 tablet (40 mg total) by mouth daily. Patient not taking: Reported on 12/21/2016 08/12/16   Rosita Fire, MD  metoprolol tartrate (LOPRESSOR) 25 MG tablet Take 0.5 tablets (12.5 mg total) by mouth 2 (two) times daily. Patient not taking: Reported on 12/21/2016 08/11/16   Rosita Fire, MD  ondansetron (ZOFRAN) 4 MG tablet Take 1 tab every 6 hours  as needed for nausea. Patient not taking: Reported on 12/21/2016 08/11/16   Rosita Fire, MD  pantoprazole (PROTONIX) 40 MG tablet Take 1 tablet (40 mg total) by mouth daily. Patient not taking: Reported on 12/21/2016 08/11/16   Rosita Fire, MD    Family History Family History  Problem Relation Age of Onset  . Colon polyps Mother   . Diabetes Maternal Grandmother   . Breast cancer Maternal Grandmother   . Diabetes Father   . Breast cancer Daughter   . Colon cancer Neg Hx     Social History Social History   Tobacco Use  . Smoking status: Never Smoker  . Smokeless tobacco: Never Used  Substance Use Topics  . Alcohol use: No    Alcohol/week: 0.0 oz  . Drug use: No  Allergies   Patient has no known allergies.   Review of Systems Review of Systems  Respiratory: Negative for shortness of breath.   Gastrointestinal: Negative for vomiting.  Neurological: Positive for focal weakness and weakness.  Psychiatric/Behavioral: Negative for confusion.  All other systems reviewed and are negative.    Physical Exam Updated Vital Signs BP (!) 186/99 (BP Location: Left Arm)   Pulse (!) 105   Temp 98.9 F (37.2 C) (Oral)   Resp 18   LMP 11/16/2002   SpO2 100%   Physical Exam  Constitutional: She is oriented to person, place, and time. She appears well-developed and well-nourished.  HENT:  Head: Normocephalic.  Right Ear: External ear normal.  Left Ear: External ear normal.  Eyes: Conjunctivae and EOM are normal. Pupils are equal, round, and reactive to light.  Neck: Normal range of motion. Neck supple.  Cardiovascular: Normal rate, regular rhythm and normal heart sounds.  Pulmonary/Chest: Effort normal.  Abdominal: Soft.  Neurological: She is alert and oriented to person, place, and time.  Weak left leg,  Weak grip  left greater than right.   Skin: Skin is warm.  Psychiatric: She has a normal mood and affect.     ED Treatments / Results  Labs (all  labs ordered are listed, but only abnormal results are displayed) Labs Reviewed  COMPREHENSIVE METABOLIC PANEL - Abnormal; Notable for the following components:      Result Value   Glucose, Bld 108 (*)    Creatinine, Ser 1.09 (*)    Albumin 2.8 (*)    GFR calc non Af Amer 54 (*)    All other components within normal limits  URINALYSIS, ROUTINE W REFLEX MICROSCOPIC - Abnormal; Notable for the following components:   Glucose, UA 50 (*)    Hgb urine dipstick MODERATE (*)    Protein, ur >=300 (*)    Squamous Epithelial / LPF 0-5 (*)    All other components within normal limits  CBC WITH DIFFERENTIAL/PLATELET  ETHANOL  PROTIME-INR  APTT  RAPID URINE DRUG SCREEN, HOSP PERFORMED  URINALYSIS, ROUTINE W REFLEX MICROSCOPIC  I-STAT TROPONIN, ED    EKG  EKG Interpretation None       Radiology Ct Head Wo Contrast  Result Date: 01/04/2018 CLINICAL DATA:  Frontal headache with left-sided weakness EXAM: CT HEAD WITHOUT CONTRAST TECHNIQUE: Contiguous axial images were obtained from the base of the skull through the vertex without intravenous contrast. COMPARISON:  October 03, 2008 FINDINGS: Brain: The ventricles are normal in size and configuration. There is a focal hemorrhage arising from the posterior right lentiform nucleus measuring 2.8 x 2.5 cm with mild surrounding cytotoxic edema. No other hemorrhage is evident. There is no well-defined mass. There is no extra-axial fluid collection or midline shift. There is patchy small vessel disease in the centra semiovale bilaterally. There is also small vessel disease in both internal and external capsule anterior limbs. Vascular: There is no appreciable hyperdense vessel. There are foci of calcification in each carotid siphon. Skull: Bony calvarium appears intact. Sinuses/Orbits: There is mucosal thickening in several ethmoid air cells bilaterally. Other paranasal sinuses are clear. Orbits appear symmetric bilaterally. Other: Mastoid air cells are  clear. IMPRESSION: 1. Focal area of hemorrhage, likely an acute hemorrhagic infarct, arising in the posterior right lentiform nucleus. 2. Atrophy with patchy supratentorial small vessel disease. No acute infarct beyond the presumed hemorrhagic infarct in the right lentiform nucleus. 3. No hemorrhagic focus elsewhere. No extra-axial fluid collection or midline shift. 4.  Foci of arterial vascular calcification noted. 5.  Mucosal thickening in several ethmoid air cells. Critical Value/emergent results were called by telephone at the time of interpretation on 01/04/2018 at 10:30 am to . Tarez Bowns, PA , who verbally acknowledged these results. Electronically Signed   By: Lowella Grip III M.D.   On: 01/04/2018 10:30    Procedures .Critical Care Performed by: Fransico Meadow, PA-C Authorized by: Fransico Meadow, PA-C   Critical care provider statement:    Critical care time (minutes):  45   Critical care start time:  01/04/2018 9:45 AM   Critical care end time:  01/04/2018 10:57 AM   Critical care time was exclusive of:  Separately billable procedures and treating other patients   Critical care was necessary to treat or prevent imminent or life-threatening deterioration of the following conditions:  CNS failure or compromise   Critical care was time spent personally by me on the following activities:  Ordering and review of radiographic studies, ordering and review of laboratory studies, ordering and performing treatments and interventions, discussions with consultants, examination of patient, evaluation of patient's response to treatment and obtaining history from patient or surrogate   I assumed direction of critical care for this patient from another provider in my specialty: no     (including critical care time)  Medications Ordered in ED Medications  sodium chloride 0.9 % bolus 500 mL (not administered)    Followed by  0.9 %  sodium chloride infusion (not administered)  labetalol  (NORMODYNE,TRANDATE) injection 20 mg (not administered)    And  clevidipine (CLEVIPREX) infusion 0.5 mg/mL (not administered)     Initial Impression / Assessment and Plan / ED Course  I have reviewed the triage vital signs and the nursing notes.  Pertinent labs & imaging results that were available during my care of the patient were reviewed by me and considered in my medical decision making (see chart for details).    Pt has weakness consistent with CVA.  Pt last normal 7p last night.  Pt sent to Ct.  Radiologist called with acute report of focal hemorrhage post r lentiform nucleus   Dr. Jeanell Sparrow in to see pt.   I consulted Dr. 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

## 2018-01-04 NOTE — ED Notes (Signed)
Dr. Rory Percy Neurologist at bedside,

## 2018-01-04 NOTE — ED Notes (Signed)
Pt in CT at this time.

## 2018-01-04 NOTE — H&P (Addendum)
Neurology Consultation  Reason for Consult: [facility] Referring Physician: Dr. Jeanell Sparrow  CC: Weakness and falls  History is obtained from: Patient, chart  HPI: [name] is a 62 y.o. female past history of CKD stage II, hypertension, diabetes, depression, who was last known normal last night, was brought in by EMS when she woke up this morning and had a fall.  She said that she fell multiple times this morning and felt weak on one side of the body on the left. She denies any headaches.  She denies any visual changes. She says she has not been compliant to her hypertensive medications as she has been sick with some abdominal problems and has been getting some workup including abdominal CTs the last few days. No history of strokes in the past.   LKW: Night prior to presentation tpa given?: no, ICH Premorbid modified Rankin scale (mRS): *0 ICH Score: 0   ROS: A review of systems was obtained with pertinent positives in the HPI.  Rest of the review negative.  Past Medical History:  Diagnosis Date  . Anemia   . Blood transfusion   . Blood transfusion without reported diagnosis   . CKD (chronic kidney disease) stage 2, GFR 60-89 ml/min   . Depression   . Diabetes mellitus   . Diverticulosis of colon with hemorrhage 2010, 09/2011  . H/O hiatal hernia   . Helicobacter pylori gastritis 09/13/2013  . Hx: UTI (urinary tract infection)   . Hypertension   . IBS (irritable bowel syndrome)   . Personal history of colonic polyps    adenomas since 2010  . Renal cell carcinoma 2004   lt kidney removed    Family History  Problem Relation Age of Onset  . Colon polyps Mother   . Diabetes Maternal Grandmother   . Breast cancer Maternal Grandmother   . Diabetes Father   . Breast cancer Daughter   . Colon cancer Neg Hx    Social History:   reports that  has never smoked. she has never used smokeless tobacco. She reports that she does not drink alcohol or use  drugs.  Medications  Current Facility-Administered Medications:  .   stroke: mapping our early stages of recovery book, , Does not apply, Once, Amie Portland, MD .  Margrett Rud sodium chloride 0.9 % bolus 500 mL, 500 mL, Intravenous, Once, 500 mL at 01/04/18 1047 **FOLLOWED BY** 0.9 %  sodium chloride infusion, 100 mL/hr, Intravenous, Continuous, Sofia, Leslie K, PA-C .  acetaminophen (TYLENOL) tablet 650 mg, 650 mg, Oral, Q4H PRN **OR** acetaminophen (TYLENOL) solution 650 mg, 650 mg, Per Tube, Q4H PRN **OR** acetaminophen (TYLENOL) suppository 650 mg, 650 mg, Rectal, Q4H PRN, Amie Portland, MD .  labetalol (NORMODYNE,TRANDATE) injection 20 mg, 20 mg, Intravenous, Once **AND** nicardipine (CARDENE) '20mg'$  in 0.86% saline 281m IV infusion (0.1 mg/ml), 0-15 mg/hr, Intravenous, Continuous, AAmie Portland MD .  pantoprazole (PROTONIX) injection 40 mg, 40 mg, Intravenous, QHS, AAmie Portland MD .  senna-docusate (Senokot-S) tablet 1 tablet, 1 tablet, Oral, BID, AAmie Portland MD  Current Outpatient Medications:  .  Blood Glucose Monitoring Suppl (ONE TOUCH ULTRA MINI) W/DEVICE KIT, Use to test blood sugar daily. Dx: E11.65, Disp: 11 each, Rfl: 0 .  BYDUREON 2 MG PEN, Inject 2 mg as directed once a week. sunday, Disp: , Rfl:  .  glucose blood (ONE TOUCH ULTRA TEST) test strip, Use to test blood sugar 2 times daily as instructed. Dx: E11.65, Disp: 100 each, Rfl: 3 .  Insulin Syringe-Needle  U-100 (RELION INSULIN SYR 0.5ML/31G) 31G X 5/16" 0.5 ML MISC, USE ONE SYRINGE TO INJECT INSULIN TWICE DAILY AS INSTRUCTED, Disp: 100 each, Rfl: 3 .  lisinopril-hydrochlorothiazide (PRINZIDE,ZESTORETIC) 20-12.5 MG tablet, Take 2 tablets by mouth daily., Disp: , Rfl:  .  NOVOLOG MIX 70/30 (70-30) 100 UNIT/ML injection, Inject 20-40 Units into the skin 2 (two) times daily with a meal. Inject 40 units in the am and Inject 20 units in the evening., Disp: , Rfl:  .  ONETOUCH DELICA LANCETS FINE MISC, Use to test blood sugar  2 times daily. Dx: E11.65, Disp: 100 each, Rfl: 3 .  amLODipine (NORVASC) 5 MG tablet, Take 1 tablet (5 mg total) by mouth 2 (two) times daily. (Patient not taking: Reported on 12/21/2016), Disp: 30 tablet, Rfl: 0 .  lisinopril (PRINIVIL,ZESTRIL) 40 MG tablet, Take 1 tablet (40 mg total) by mouth daily. (Patient not taking: Reported on 12/21/2016), Disp: 30 tablet, Rfl: 0 .  metoprolol tartrate (LOPRESSOR) 25 MG tablet, Take 0.5 tablets (12.5 mg total) by mouth 2 (two) times daily. (Patient not taking: Reported on 12/21/2016), Disp: 30 tablet, Rfl: 0 .  ondansetron (ZOFRAN) 4 MG tablet, Take 1 tab every 6 hours as needed for nausea. (Patient not taking: Reported on 12/21/2016), Disp: 12 tablet, Rfl: 1 .  pantoprazole (PROTONIX) 40 MG tablet, Take 1 tablet (40 mg total) by mouth daily. (Patient not taking: Reported on 12/21/2016), Disp: 15 tablet, Rfl: 0   Exam: Current vital signs: BP (!) 186/99 (BP Location: Left Arm)   Pulse (!) 105   Temp 98.9 F (37.2 C) (Oral)   Resp 18   LMP 11/16/2002   SpO2 100%  Vital signs in last 24 hours: Temp:  [98.9 F (37.2 C)] 98.9 F (37.2 C) (02/21 0945) Pulse Rate:  [105] 105 (02/21 0943) Resp:  [18] 18 (02/21 0943) BP: (186)/(99) 186/99 (02/21 0943) SpO2:  [100 %] 100 % (02/21 0943)  GENERAL: Awake, alert in NAD HEENT: - Normocephalic and atraumatic, dry mm, no LN++, no Thyromegally LUNGS - Clear to auscultation bilaterally with no wheezes CV - S1S2 RRR, no m/r/g, equal pulses bilaterally. ABDOMEN - Soft, nontender, nondistended with normoactive BS Ext: warm, well perfused, intact peripheral pulses  NEURO:  Mental Status: AA&Ox3  Language: speech is not dysarthric naming, repetition, fluency, and comprehension intact. Cranial Nerves: PERRL. EOMI, visual fields full, no facial asymmetry facial sensation intact, hearing intact, tongue/uvula/soft palate midline, normal sternocleidomastoid and trapezius muscle strength. No evidence of tongue atrophy or  fibrillations Motor: 4/5 left upper extremity, 4+/5 left lower extremity, 5/5 right upper extremity, 5/5 right lower extremity. Tone: is normal and bulk is normal Sensation- Intact to light touch bilaterally but reports some subjective heaviness on the left arm and leg Coordination: FTN intact bilaterally Gait- deferred  NIHSS - 2  Labs I have reviewed labs in epic and the results pertinent to this consultation are:  CBC    Component Value Date/Time   WBC 10.2 01/04/2018 0950   RBC 4.65 01/04/2018 0950   HGB 13.1 01/04/2018 0950   HCT 40.7 01/04/2018 0950   PLT 231 01/04/2018 0950   MCV 87.5 01/04/2018 0950   MCH 28.2 01/04/2018 0950   MCHC 32.2 01/04/2018 0950   RDW 14.6 01/04/2018 0950   LYMPHSABS 2.3 01/04/2018 0950   MONOABS 0.5 01/04/2018 0950   EOSABS 0.0 01/04/2018 0950   BASOSABS 0.0 01/04/2018 0950    CMP     Component Value Date/Time   NA 140 01/04/2018  0950   K 3.7 01/04/2018 0950   CL 105 01/04/2018 0950   CO2 24 01/04/2018 0950   GLUCOSE 108 (H) 01/04/2018 0950   BUN 14 01/04/2018 0950   CREATININE 1.09 (H) 01/04/2018 0950   CALCIUM 8.9 01/04/2018 0950   PROT 6.8 01/04/2018 0950   ALBUMIN 2.8 (L) 01/04/2018 0950   AST 32 01/04/2018 0950   ALT 19 01/04/2018 0950   ALKPHOS 83 01/04/2018 0950   BILITOT 0.5 01/04/2018 0950   GFRNONAA 54 (L) 01/04/2018 0950   GFRAA >60 01/04/2018 0950   Lipid Panel     Component Value Date/Time   CHOL 196 10/26/2015 1432   TRIG 140.0 10/26/2015 1432   HDL 45.40 10/26/2015 1432   CHOLHDL 4 10/26/2015 1432   VLDL 28.0 10/26/2015 1432   LDLCALC 122 (H) 10/26/2015 1432   Imaging I have reviewed the images obtained:  CT-scan of the brain -shows a acute bleed in the right lentiform nucleus.  Atrophy.  Assessment:  62 year old woman with uncontrolled hypertension, diabetes presents with complaints of multiple falls secondary to left-sided weakness. Last known normal last night.  CT scan shows right lentiform nucleus  ICH, likely hypertensive in etiology. I would recommend admission to the neurological ICU and evaluation by the stroke team.  Assessment:   Plan: Subcortical ICH, nontraumatic Acuity: Acute Laterality: Right lentiform nucleus Current suspected etiology: Hypertension Treatment: -Admit to neurological ICU -ICH Score: 0 -ICH Volume: 30 cc -BP control goal SYS<140 -Cardene drip and labetalol as needed -neuromonitoring -CT angiogram of the head -MRI brain with and without contrast  CNS Cerebral edema Compression of brain -Close neuro monitoring -NPO until cleared by speech -ST  Hemiplegia and hemiparesis following nontraumatic intracerebral hemorrhage affecting left non-dominant side  -Continue PT/OT/ST  RESP No acute issues Monitor clinically  CV Hypertensive Emergency -Aggressive BP control, goal SBP < 140 as above -TTE  GI/GU -Gentle hydration  HEME No acute issues Check CBC tomorrow Check PT/INR   ENDO Type 2 diabetes mellitus with hyperglycemia  -goal HgbA1c < 7 -SSI  Fluid/Electrolyte Disorders -Replete -Repeat labs  ID No active issues   Nutrition E66.9 Obesity  -diet consult  Prophylaxis DVT: SCDs.  No antiplatelets or anticoagulants due to [place]. GI: Not applicable Bowel: Docusate senna  Dispo: Pending clinical course  Diet: NPO until cleared by bedside swallow  Code Status: Full Code -- Amie Portland, MD [facility] Pager: [phone] If 7pm to 7am, please call on call as listed on [facility].   CRITICAL CARE ATTESTATION This patient is critically ill and at significant risk of neurological worsening, death and care requires constant monitoring of vital signs, hemodynamics,respiratory and cardiac monitoring. I spent 35  minutes of neurocritical care time performing neurological assessment, discussion with family, other specialists and medical decision making of high complexityin the care of  this patient.

## 2018-01-05 ENCOUNTER — Ambulatory Visit (HOSPITAL_COMMUNITY): Payer: Medicare Other

## 2018-01-05 ENCOUNTER — Other Ambulatory Visit (HOSPITAL_COMMUNITY): Payer: Medicare Other

## 2018-01-05 ENCOUNTER — Inpatient Hospital Stay (HOSPITAL_COMMUNITY): Payer: Medicare Other

## 2018-01-05 DIAGNOSIS — I10 Essential (primary) hypertension: Secondary | ICD-10-CM

## 2018-01-05 LAB — ECHOCARDIOGRAM COMPLETE
Ao-asc: 26 cm
Area-P 1/2: 3.49 cm2
E decel time: 215 msec
E/e' ratio: 11.49
FS: 23 % — AB (ref 28–44)
IV/PV OW: 0.79
LA ID, A-P, ES: 38 mm
LA diam end sys: 38 mm
LA diam index: 1.78 cm/m2
LA vol index: 22.9 mL/m2
LAVOL: 48.7 mL
LAVOLA4C: 52.1 mL
LV TDI E'LATERAL: 7.94
LV TDI E'MEDIAL: 7.4
LV e' LATERAL: 7.94 cm/s
LVEEAVG: 11.49
LVEEMED: 11.49
LVOT area: 3.14 cm2
LVOT diameter: 20 mm
Lateral S' vel: 17.6 cm/s
MV Dec: 215
MV pk A vel: 98 m/s
MVPG: 3 mmHg
MVPKEVEL: 91.2 m/s
P 1/2 time: 63 ms
PW: 14 mm — AB (ref 0.6–1.1)
RV TAPSE: 18.9 mm
Reg peak vel: 233 cm/s
TRMAXVEL: 233 cm/s

## 2018-01-05 LAB — GLUCOSE, CAPILLARY
GLUCOSE-CAPILLARY: 129 mg/dL — AB (ref 65–99)
GLUCOSE-CAPILLARY: 106 mg/dL — AB (ref 65–99)
Glucose-Capillary: 116 mg/dL — ABNORMAL HIGH (ref 65–99)
Glucose-Capillary: 94 mg/dL (ref 65–99)

## 2018-01-05 LAB — HIV ANTIBODY (ROUTINE TESTING W REFLEX): HIV SCREEN 4TH GENERATION: NONREACTIVE

## 2018-01-05 MED ORDER — PANTOPRAZOLE SODIUM 40 MG PO TBEC
40.0000 mg | DELAYED_RELEASE_TABLET | Freq: Every day | ORAL | Status: DC
  Filled 2018-01-05: qty 1

## 2018-01-05 MED ORDER — AMLODIPINE BESYLATE 5 MG PO TABS
5.0000 mg | ORAL_TABLET | Freq: Two times a day (BID) | ORAL | Status: DC
  Filled 2018-01-05 (×2): qty 1

## 2018-01-05 MED ORDER — LISINOPRIL-HYDROCHLOROTHIAZIDE 20-12.5 MG PO TABS: 2.0000 | ORAL_TABLET | Freq: Every day | ORAL | Status: DC

## 2018-01-05 MED ORDER — LABETALOL HCL 5 MG/ML IV SOLN: 10.0000 mg | INTRAVENOUS | Status: DC | PRN

## 2018-01-05 MED ORDER — HYDROCHLOROTHIAZIDE 25 MG PO TABS
25.0000 mg | ORAL_TABLET | Freq: Every day | ORAL | Status: DC
  Administered 2018-01-05 – 2018-01-06 (×2): 25 mg via ORAL
  Filled 2018-01-05 (×2): qty 1

## 2018-01-05 MED ORDER — LISINOPRIL 20 MG PO TABS
40.0000 mg | ORAL_TABLET | Freq: Every day | ORAL | Status: DC
  Administered 2018-01-05 – 2018-01-06 (×2): 40 mg via ORAL
  Filled 2018-01-05 (×2): qty 2

## 2018-01-05 NOTE — Progress Notes (Signed)
Occupational Therapy Evaluation Patient Details Name: [name] MRN: [id number] DOB: [birth year] Today's Date: 01/05/2018    History of Present Illness 62 yo admitted after falls with left weakness and right lentiform nucleus ICH. PMHx: CKD stage II, HTN, DM, depression, renal CA   Clinical Impression   PTA, pt lived at home independently and was the primary caregiver for her 2 grandsons since her daughter's death last year. Pt demonstrates sensorimotor deficits, especially with the LLE and L inattention affecting her functional ability with ADL and mobility at this time. Due to pt's current impairment, feel that she would greatly benefit from [facility] services. Pt states she prefers HH. Will follow acutely to address established goals to facilitate safe DC home.    Follow Up Recommendations  Home health OT;Supervision - Intermittent    Equipment Recommendations  3 in 1 bedside commode    Recommendations for Other Services       Precautions / Restrictions Precautions Precautions: Fall Precaution Comments: left inattention Restrictions Weight Bearing Restrictions: No      Mobility Bed Mobility Overal bed mobility: Needs Assistance Bed Mobility: Sit to Supine;Supine to Sit     Supine to sit: Supervision Sit to supine: Supervision      Transfers Overall transfer level: Needs assistance   Transfers: Sit to/from Stand Sit to Stand: Supervision              Balance Overall balance assessment: Mild deficits observed, not formally tested;History of Falls(3 falls at home PTA)                                         ADL either performed or assessed with clinical judgement   ADL Overall ADL's : Needs assistance/impaired     Grooming: Set up;Standing   Upper Body Bathing: Set up;Sitting   Lower Body Bathing: Minimal assistance;Sit to/from stand   Upper Body Dressing : Set up;Sitting   Lower Body Dressing: Minimal assistance   Toilet  Transfer: Min guard;RW   Toileting- Clothing Manipulation and Hygiene: Supervision/safety;Sit to/from stand       Functional mobility during ADLs: Min guard;Rolling walker;Cueing for safety General ADL Comments: Pt very anxious about mobilizing without a RW and states "my foot feels so fat" Complains of dizziness when bending over; May benefit form AE for LB ADL     Vision Baseline Vision/History: Wears glasses Wears Glasses: Reading only Patient Visual Report: No change from baseline Additional Comments: appeasr intact     Perception Perception Comments: L inattention   Praxis Praxis Praxis tested?: Within functional limits    Pertinent Vitals/Pain Pain Assessment: 0-10 Pain Location: left knee Pain Descriptors / Indicators: Aching;Sore Pain Intervention(s): Limited activity within patient's tolerance     Hand Dominance Right   Extremity/Trunk Assessment Upper Extremity Assessment Upper Extremity Assessment: LUE deficits/detail LUE Deficits / Details: equal sensation with testing, decreased awareness with activity, LUE dropping off armrest   Lower Extremity Assessment Lower Extremity Assessment: LLE deficits/detail LLE Deficits / Details: decreased sensation left foot LLE Sensation: decreased light touch   Cervical / Trunk Assessment Cervical / Trunk Assessment: Normal   Communication Communication Communication: No difficulties   Cognition Arousal/Alertness: Awake/alert Behavior During Therapy: WFL for tasks assessed/performed Overall Cognitive Status: Impaired/Different from baseline Area of Impairment: Safety/judgement;Attention                   Current Attention Level: Selective  Safety/Judgement: Decreased awareness of deficits;Decreased awareness of safety     General Comments: L inattention noted during tasks   General Comments   Pt talked about her daughter's death in detail and expressed concern for dying and leaving her grandson's  alone; discussed how family support is limited    Exercises     Shoulder Instructions      Home Living Family/patient expects to be discharged to:: Private residence Living Arrangements: Other relatives(takes care of her two grandsons) Available Help at Discharge: Family Type of Home: Apartment Home Access: Level entry     Home Layout: One level     Bathroom Shower/Tub: Teacher, early years/pre: Standard Bathroom Accessibility: Yes How Accessible: Accessible via walker Home Equipment: None          Prior Functioning/Environment Level of Independence: Independent        Comments: drives; cares for her 2 Grandsons (110 yo 44yo); daughter passed awawy @ 1 yr ago        OT Problem List: Decreased strength;Impaired balance (sitting and/or standing);Decreased activity tolerance;Decreased coordination;Decreased cognition;Decreased safety awareness;Obesity;Impaired sensation;Pain      OT Treatment/Interventions: Self-care/ADL training;Therapeutic exercise;Neuromuscular education;DME and/or AE instruction;Therapeutic activities;Cognitive remediation/compensation;Patient/family education;Balance training    OT Goals(Current goals can be found in the care plan section) Acute Rehab OT Goals Patient Stated Goal: to be there for her grandsons OT Goal Formulation: With patient Time For Goal Achievement: 01/19/18 Potential to Achieve Goals: Good  OT Frequency: Min 3X/week   Barriers to D/C:            Co-evaluation              AM-PAC PT "6 Clicks" Daily Activity     Outcome Measure Help from another person eating meals?: None Help from another person taking care of personal grooming?: A Little Help from another person toileting, which includes using toliet, bedpan, or urinal?: A Little Help from another person bathing (including washing, rinsing, drying)?: A Little Help from another person to put on and taking off regular upper body clothing?: A  Little Help from another person to put on and taking off regular lower body clothing?: A Little 6 Click Score: 19   End of Session Equipment Utilized During Treatment: Gait belt;Rolling walker Nurse Communication: Mobility status  Activity Tolerance: Patient tolerated treatment well Patient left: in bed;with call bell/phone within reach;with bed alarm set  OT Visit Diagnosis: Unsteadiness on feet (R26.81);Muscle weakness (generalized) (M62.81);Other symptoms and signs involving cognitive function;Dizziness and giddiness (R42)                Time: 1730-1800 OT Time Calculation (min): 30 min Charges:  OT General Charges $OT Visit: 1 Visit OT Evaluation $OT Eval Moderate Complexity: 1 Mod OT Treatments $Self Care/Home Management : 8-22 mins G-Codes:     [facility], OT/L  [phone] 01/05/2018  [place],HILLARY 01/05/2018, 6:27 PM

## 2018-01-05 NOTE — Progress Notes (Signed)
  Echocardiogram 2D Echocardiogram has been performed.  [name] 01/05/2018, 4:34 PM

## 2018-01-05 NOTE — Evaluation (Signed)
Clinical/Bedside Swallow Evaluation Patient Details  Name: [name] MRN: [id number] Date of Birth: 21-May-1956  Today's Date: 01/05/2018 Time: SLP Start Time (ACUTE ONLY): 1229 SLP Stop Time (ACUTE ONLY): 1240 SLP Time Calculation (min) (ACUTE ONLY): 11 min  Past Medical History:  Past Medical History:  Diagnosis Date  . Anemia   . Blood transfusion   . Blood transfusion without reported diagnosis   . CKD (chronic kidney disease) stage 2, GFR 60-89 ml/min   . Depression   . Diabetes mellitus   . Diverticulosis of colon with hemorrhage 2010, 09/2011  . H/O hiatal hernia   . Helicobacter pylori gastritis 09/13/2013  . Hx: UTI (urinary tract infection)   . Hypertension   . IBS (irritable bowel syndrome)   . Personal history of colonic polyps    adenomas since 2010  . Renal cell carcinoma 2004   lt kidney removed   Past Surgical History:  Past Surgical History:  Procedure Laterality Date  . ABDOMINAL HYSTERECTOMY    . ABDOMINAL HYSTERECTOMY    . CHOLECYSTECTOMY    . COLONOSCOPY  12/19/2008   diverticulosis  . COLONOSCOPY  11/17/2011   Procedure: COLONOSCOPY;  Surgeon: Zenovia Jarred, MD;  Location: [facility];  Service: Gastroenterology;  Laterality: N/A;  . DILATION AND CURETTAGE OF UTERUS N/A 07/11/2014   Procedure: Repair of Vaginal Cuff;  Surgeon: Melina Schools, MD;  Location: [facility];  Service: Gynecology;  Laterality: N/A;  . FLEXIBLE SIGMOIDOSCOPY  12/12/2008   diverticulosis  . HAND SURGERY     x5 right  . HYDRADENITIS EXCISION Right 03/21/2014   Procedure: EXCISION HIDRADENITIS AXILLA;  Surgeon: Pedro Earls, MD;  Location: [facility];  Service: General;  Laterality: Right;  . KNEE ARTHROSCOPY     x2 left  . NEPHRECTOMY     left  . TRIGGER FINGER RELEASE Left 06/23/2015   Procedure: RELEASE TRIGGER FINGER/A-1 PULLEY LEFT RING FINGER;  Surgeon: Daryll Brod, MD;  Location: [facility];  Service: Orthopedics;  Laterality: Left;  .  TUBAL LIGATION    . UPPER GASTROINTESTINAL ENDOSCOPY  12/12/2008   gastroporesis   HPI:  [name] a 62 y.o.femalepast history of CKD stage II, hypertension, diabetes, depression, who was last known normal last night admitted after a fall and weakness on left side of body. Head CT focal area of hemorrhage, likely an acute hemorrhagic infarct, arising in the posterior right lentiform nucleus, atrophy with patchy supratentorial small vessel disease. No acute infarct beyond the presumed hemorrhagic infarct in the right lentiform nucleus. CXR No active disease.   Assessment / Plan / Recommendation Clinical Impression  Swallow assessment ordered after coughing episode earlier today. RN reported pt was reclined while drinking and pt stated she was trying to talk while swallowing "which I shouldn't have done." Oral  mastication and transit brisk without residue. Thin water via cup and straw consumed without coughing, throat clearing or wet vocal quality over consecutive trials. Recommend pt continue regular texture, thin liquids, pills with thin and upright position with meals/meds. Reiterated importance of upright position.   SLP Visit Diagnosis: Dysphagia, unspecified (R13.10)    Aspiration Risk  Mild aspiration risk    Diet Recommendation Regular;Thin liquid   Liquid Administration via: Cup;Straw Medication Administration: Whole meds with liquid Supervision: Patient able to self feed Compensations: Slow rate;Small sips/bites Postural Changes: Seated upright at 90 degrees    Other  Recommendations Oral Care Recommendations: Oral care BID   Follow up Recommendations None  Frequency and Duration            Prognosis        Swallow Study   General HPI: [name] a 62 y.o.femalepast history of CKD stage II, hypertension, diabetes, depression, who was last known normal last night admitted after a fall and weakness on left side of body. Head CT focal area of  hemorrhage, likely an acute hemorrhagic infarct, arising in the posterior right lentiform nucleus, atrophy with patchy supratentorial small vessel disease. No acute infarct beyond the presumed hemorrhagic infarct in the right lentiform nucleus. CXR No active disease. Type of Study: Bedside Swallow Evaluation Previous Swallow Assessment: (none) Diet Prior to this Study: Regular;Thin liquids Temperature Spikes Noted: No Respiratory Status: Room air History of Recent Intubation: No Behavior/Cognition: Alert;Cooperative;Pleasant mood Oral Cavity Assessment: Within Functional Limits Oral Care Completed by SLP: No Oral Cavity - Dentition: Adequate natural dentition Vision: Functional for self-feeding Self-Feeding Abilities: Able to feed self Patient Positioning: Upright in chair Baseline Vocal Quality: Normal Volitional Cough: Strong Volitional Swallow: Able to elicit    Oral/Motor/Sensory Function Overall Oral Motor/Sensory Function: Within functional limits   Ice Chips Ice chips: Not tested   Thin Liquid Thin Liquid: Within functional limits    Nectar Thick Nectar Thick Liquid: Not tested   Honey Thick Honey Thick Liquid: Not tested   Puree     Solid   GO   Solid: Within functional limits        [name], Orbie Pyo 01/05/2018,2:51 PM Orbie Pyo [place].[employer] [phone]

## 2018-01-05 NOTE — Progress Notes (Addendum)
NEUROHOSPITALISTS STROKE TEAM - DAILY PROGRESS NOTE   ADMISSION HISTORY:  Yakelin Grenier is a 62 y.o. female past history of CKD stage II, hypertension, diabetes, depression, who was last known normal last night, was brought in by EMS when she woke up this morning and had a fall.  She said that she fell multiple times this morning and felt weak on one side of the body on the left. She denies any headaches.  She denies any visual changes.  She says she has not been compliant to her hypertensive medications as she has been sick with some abdominal problems and has been getting some workup including abdominal CTs the last few days.   No history of strokes in the past.  LKW: Night prior to presentation tpa given?: no, ICH Premorbid modified Rankin scale (mRS): *0 ICH Score: 0 NIHSS - 2  SUBJECTIVE (INTERVAL HISTORY) No family is at the bedside. Patient is found laying in bed in NAD. Overall she feels her condition is rapidly improving. Voices no new complaints. No new events reported overnight. Patient has refused MRI imaging and refused to take Norvasc for her B/P. States her daughter died this past 2023-04-29 and she is now responsible for her two grandsons. She seems very stress by being in the hospital.   OBJECTIVE Lab Results: CBC:  Recent Labs  Lab 01/04/18 0950  WBC 10.2  HGB 13.1  HCT 40.7  MCV 87.5  PLT 231   BMP: Recent Labs  Lab 01/04/18 0950  NA 140  K 3.7  CL 105  CO2 24  GLUCOSE 108*  BUN 14  CREATININE 1.09*  CALCIUM 8.9   Liver Function Tests:  Recent Labs  Lab 01/04/18 0950  AST 32  ALT 19  ALKPHOS 83  BILITOT 0.5  PROT 6.8  ALBUMIN 2.8*   Coagulation Studies:  Recent Labs    01/04/18 1054  APTT 31  INR 0.97   Urinalysis:  Recent Labs  Lab 01/04/18 0929  COLORURINE YELLOW  APPEARANCEUR CLEAR  LABSPEC 1.012  PHURINE 6.0  GLUCOSEU 50*  HGBUR MODERATE*  BILIRUBINUR NEGATIVE  KETONESUR  NEGATIVE  PROTEINUR >=300*  NITRITE NEGATIVE  LEUKOCYTESUR NEGATIVE   Urine Drug Screen:     Component Value Date/Time   LABOPIA NONE DETECTED 01/04/2018 0929   COCAINSCRNUR NONE DETECTED 01/04/2018 0929   LABBENZ NONE DETECTED 01/04/2018 0929   AMPHETMU NONE DETECTED 01/04/2018 0929   THCU NONE DETECTED 01/04/2018 0929   LABBARB NONE DETECTED 01/04/2018 0929    Alcohol Level:  Recent Labs  Lab 01/04/18 1037  ETH <10   PHYSICAL EXAM Temp:  [97.6 F (36.4 C)-98.8 F (37.1 C)] 98.8 F (37.1 C) (02/22 1200) Pulse Rate:  [82-112] 89 (02/22 1400) Resp:  [10-27] 16 (02/22 1400) BP: (91-164)/(53-95) 125/70 (02/22 1400) SpO2:  [89 %-100 %] 98 % (02/22 1400) General - pleasant middle-aged African-American lady, in no apparent distress HEENT-  Normocephalic,   Cardiovascular - Regular rate and rhythm  Respiratory - Lungs clear bilaterally. No wheezing. Abdomen - soft and non-tender, BS normal Extremities- no edema or cyanosis Mental Status: AA&Ox3  Language: speech is not dysarthric naming, repetition, fluency, and comprehension intact. Cranial Nerves: PERRL. EOMI, visual fields full, no facial asymmetry facial sensation intact, hearing intact, tongue/uvula/soft palate midline, normal sternocleidomastoid and trapezius muscle strength. No evidence of tongue atrophy or fibrillations Motor: 4/5 left upper extremity, 4+/5 left lower extremity, 5/5 right upper extremity, 5/5 right lower extremity. Diminished fine finger movements on the left.  Orbits right over left upper extremity. Tone: is normal and bulk is normal Sensation- Intact to light touch bilaterally but reports some subjective heaviness on the left arm and leg Coordination: FTN intact bilaterally Gait- deferred  IMAGING: I have personally reviewed the radiological images below and agree with the radiology interpretations. Ct Angio Head W Or Wo Contrast Result Date: 01/04/2018  IMPRESSION: Right lateral basal ganglia  hemorrhage is stable. No underlying vascular malformation or tumor.   Ct Head Wo Contrast Result Date: 01/05/2018 IMPRESSION: No change in a right posterior basal ganglia/radiating white matter track intraparenchymal hematoma, measuring 3.3 x 2.3 x 1.5 cm with mild surrounding edema and mild mass effect with right-to-left shift of 2 mm.   Ct Head Wo Contrast Result Date: 01/04/2018 IMPRESSION: 1. Focal area of hemorrhage, likely an acute hemorrhagic infarct, arising in the posterior right lentiform nucleus. 2. Atrophy with patchy supratentorial small vessel disease. No acute infarct beyond the presumed hemorrhagic infarct in the right lentiform nucleus. 3. No hemorrhagic focus elsewhere. No extra-axial fluid collection or midline shift. 4.  Foci of arterial vascular calcification noted. 5.  Mucosal thickening in several ethmoid air cells.   Dg Chest Port 1 View Result Date: 01/05/2018  IMPRESSION: No active disease.  Echocardiogram:                                              PENDING B/L Carotid U/S:                                                PENDING     IMPRESSION: Ms. Milissa Fesperman is a 62 y.o. female with PMH of uncontrolled hypertension, diabetes presents with complaints of multiple falls secondary to left-sided weakness. CT Head reveals:  Acute  posterior right lentiform nucleus hemorrhage due to hypertension  Suspected Etiology: likely hypertensive hemorrhage.   Resultant Symptoms: left-sided weakness.  Stroke Risk Factors: diabetes mellitus, hyperlipidemia and hypertension Other Stroke Risk Factors: Advanced age, Obesity, CKD, Hx Renal Cell Cancer  Outstanding Stroke Work-up Studies:     Echocardiogram:                                                    PENDING B/L Carotid U/S:                                                     PENDING  PLAN  01/05/2018: Continue Statin Frequent neuro checks Telemetry monitoring PT/OT/SLP Consult PM & Rehab Consult Case Management  /MSW Ongoing aggressive stroke risk factor management Patient counseled to be compliant with her medications Patient counseled on Lifestyle modifications including, Diet, Exercise, and Stress Follow up with [facility] in 6 weeks Likely discharge home in AM if stable overnight  RESP No acute issues Monitor clinically  CKD -Gentle IV hydration Avoid Nephrotoxic meds  HEME No acute issues Check CBC tomorrow Check PT/INR  ENDO Type 2 diabetes  mellitus with hyperglycemia  -goal HgbA1c < 7 -SSI  Fluid/Electrolyte Disorders -Replete -Repeat labs  ID No active issues   Prophylaxis DVT: SCDs.  No antiplatelets or anticoagulants due to [place]. GI: Not applicable Bowel: Docusate senna  HYPERTENSION: Stable SBP goal less than 140/90  Nicardipine drip, Labetolol PRN Long term BP goal normotensive. May slowly restart home B/P medications after 48 hours Home Meds: Prizide, Norvasc  HYPERLIPIDEMIA:  CURRENT LIPID PANEL PENDING    Component Value Date/Time   CHOL 196 10/26/2015 1432   TRIG 140.0 10/26/2015 1432   HDL 45.40 10/26/2015 1432   CHOLHDL 4 10/26/2015 1432   VLDL 28.0 10/26/2015 1432   LDLCALC 122 (H) 10/26/2015 1432  Home Meds:  NONE LDL  goal < 70 Will likely start on Lipitor 40 mg daily in AM Continue statin at discharge  DIABETES: CURRENT HGA1C PENDING Lab Results  Component Value Date   HGBA1C 12.2 (H) 10/26/2015  HgbA1c goal < 7.0 Currently on: Novolog Continue CBG monitoring and SSI to maintain glucose 140-180 mg/dl DM education   OBESITY Obesity, 99.8 kg Dietary consultation  Other Active Problems: Active Problems:   ICH (intracerebral hemorrhage) [facility])    Hospital day # 1 VTE prophylaxis: SCD's  Diet : Fall precautions Diet Carb Modified Fluid consistency: Thin; Room service appropriate? Yes   FAMILY UPDATES:  family at bedside  TEAM UPDATES: Garvin Fila, MD   Prior Home Stroke Medications:  No antithrombotic    Discharge Stroke Meds:  Please discharge patient on No antithrombotic and DUE TO ICH   Disposition: 01-Home or Self Care Therapy Recs:               Outpatient PT Home Equipment:         Rolling walker with 5" wheels  Follow Up:  [facility] [place] -PCP Follow up in 1-2 weeks   Assessment & plan discussed with with attending physician and they are in agreement.    Mary Sella, ANP-C Stroke Neurology Team 01/05/2018 3:03 PM   01/05/2018 ATTENDING ASSESSMENT:    She presented with left hemiparesis due to right basal ganglia hemorrhage likely due to hypertension. Recommend strict blood pressure control and close neurological monitoring. Follow-up imaging study later today. If neurologically stable mobilize out of bed and therapy and rehabilitation consults. This patient is critically ill and at significant risk of neurological worsening, death and care requires constant monitoring of vital signs, hemodynamics,respiratory and cardiac monitoring, extensive review of multiple databases, frequent neurological assessment, discussion with family, other specialists and medical decision making of high complexity.I have made any additions or clarifications directly to the above note.This critical care time does not reflect procedure time, or teaching time or supervisory time of PA/NP/Med Resident etc but could involve care discussion time.  I spent 30 minutes of neurocritical care time  in the care of  this patient.     To contact Stroke Continuity provider, please refer to [email or web address] After hours, contact General Neurology

## 2018-01-05 NOTE — Evaluation (Signed)
Physical Therapy Evaluation Patient Details Name: [name] MRN: [id number] DOB: [birth year] Today's Date: 01/05/2018   History of Present Illness  62 yo admitted after falls with left weakness and right lentiform nucleus ICH. PMHx: CKD stage II, HTN, DM, depression, renal CA  Clinical Impression  Pt pleasant oriented x 4 with decreased activity tolerance, left inattention, painful left knee with impaired gait, function and mobility who will benefit from acute therapy to maximize mobility, function, safety and independence to return to PLOF. REcommend OOB daily with nursing and daily ambulation. Pt educated for deficits, fall risk and BE FAST.   VSS on RA throughout    Follow Up Recommendations Outpatient PT;Supervision for mobility/OOB    Equipment Recommendations  Rolling walker with 5" wheels    Recommendations for Other Services OT consult     Precautions / Restrictions Precautions Precautions: Fall Precaution Comments: left inattention      Mobility  Bed Mobility               General bed mobility comments: in chair on arrival  Transfers Overall transfer level: Needs assistance   Transfers: Sit to/from Stand Sit to Stand: Min guard         General transfer comment: cues for hand placement, cues to attend to left hand dropping off armrest, use of rail at toilet to sit and stand  Ambulation/Gait Ambulation/Gait assistance: Min guard Ambulation Distance (Feet): 60 Feet Assistive device: Rolling walker (2 wheeled) Gait Pattern/deviations: Wide base of support;Step-through pattern;Decreased weight shift to left   Gait velocity interpretation: Below normal speed for age/gender General Gait Details: pt reports painful knee on left with cues to step into RW, direction and avoid door on left as pt running into it with her arm, without RW pt with decreased fluidity and stride. Gait liimited by fatigue  Stairs            Wheelchair Mobility     Modified Rankin (Stroke Patients Only) Modified Rankin (Stroke Patients Only) Pre-Morbid Rankin Score: No symptoms Modified Rankin: Moderately severe disability     Balance Overall balance assessment: Mild deficits observed, not formally tested                                           Pertinent Vitals/Pain Pain Assessment: 0-10 Pain Score: 6  Pain Location: left knee Pain Descriptors / Indicators: Aching;Sore Pain Intervention(s): Limited activity within patient's tolerance    Home Living Family/patient expects to be discharged to:: Private residence   Available Help at Discharge: Family Type of Home: Apartment Home Access: Level entry     Home Layout: One level Home Equipment: None      Prior Function Level of Independence: Independent               Hand Dominance        Extremity/Trunk Assessment   Upper Extremity Assessment Upper Extremity Assessment: LUE deficits/detail LUE Deficits / Details: equal sensation with testing, decreased awareness with activity, LUE dropping off armrest    Lower Extremity Assessment Lower Extremity Assessment: Overall WFL for tasks assessed;LLE deficits/detail(5/5 bil LE) LLE Deficits / Details: decreased sensation left foot LLE Sensation: decreased light touch       Communication   Communication: No difficulties  Cognition Arousal/Alertness: Awake/alert Behavior During Therapy: WFL for tasks assessed/performed Overall Cognitive Status: Impaired/Different from baseline Area of Impairment: Safety/judgement  Safety/Judgement: Decreased awareness of deficits;Decreased awareness of safety     General Comments: left inattention with transfers and gait with cues to attend      General Comments      Exercises     Assessment/Plan    PT Assessment Patient needs continued PT services  PT Problem List Decreased mobility;Decreased safety awareness;Decreased  activity tolerance;Decreased balance;Decreased knowledge of use of DME;Impaired sensation       PT Treatment Interventions Gait training;Therapeutic exercise;Patient/family education;Functional mobility training;Balance training;Neuromuscular re-education;DME instruction;Therapeutic activities    PT Goals (Current goals can be found in the Care Plan section)  Acute Rehab PT Goals Patient Stated Goal: get back to the [facility] biking PT Goal Formulation: With patient Time For Goal Achievement: 01/19/18 Potential to Achieve Goals: Good    Frequency Min 4X/week   Barriers to discharge Decreased caregiver support      Co-evaluation               AM-PAC PT "6 Clicks" Daily Activity  Outcome Measure Difficulty turning over in bed (including adjusting bedclothes, sheets and blankets)?: A Little Difficulty moving from lying on back to sitting on the side of the bed? : A Little Difficulty sitting down on and standing up from a chair with arms (e.g., wheelchair, bedside commode, etc,.)?: A Little Help needed moving to and from a bed to chair (including a wheelchair)?: A Little Help needed walking in hospital room?: A Little Help needed climbing 3-5 steps with a railing? : A Lot 6 Click Score: 17    End of Session Equipment Utilized During Treatment: Gait belt Activity Tolerance: Patient tolerated treatment well Patient left: in chair;with call bell/phone within reach;with chair alarm set;with nursing/sitter in room Nurse Communication: Mobility status;Precautions PT Visit Diagnosis: Other abnormalities of gait and mobility (R26.89);Other symptoms and signs involving the nervous system (R29.898)    Time: 9826-4158 PT Time Calculation (min) (ACUTE ONLY): 33 min   Charges:   PT Evaluation $PT Eval Moderate Complexity: 1 Mod PT Treatments $Gait Training: 8-22 mins   PT G Codes:        Elwyn Reach, PT [phone]   [place] 01/05/2018, 10:12 AM

## 2018-01-05 NOTE — Progress Notes (Signed)
Pt transferred to [address]. Pt notified family on her cell phone. Pt transferred with phone and phone charger and clothing, one purse, one backpack, and one white "pt belonging bag". Report given to [facility], Therapist, sports.

## 2018-01-05 NOTE — Care Management Note (Signed)
Case Management Note  Patient Details  Name: [name] MRN: [id number] Date of Birth: 27-Jan-1956  Subjective/Objective: 62 yo admitted after falls with left weakness and right lentiform nucleus ICH.  PTA, pt independent of ADLS and from home. Supportive family available to assist at dc.                    Action/Plan: PT recommending OP follow up; OT consult pending.  CM will continue to follow for discharge needs.    Expected Discharge Date:                  Expected Discharge Plan:  OP Rehab  In-House Referral:     Discharge planning Services  CM Consult  Post Acute Care Choice:    Choice offered to:     DME Arranged:    DME Agency:     HH Arranged:    HH Agency:     Status of Service:  In process, will continue to follow  If discussed at Long Length of Stay Meetings, dates discussed:    Additional Comments:  Reinaldo Raddle, RN, BSN  Trauma/Neuro ICU Case Manager [phone]

## 2018-01-05 NOTE — Progress Notes (Signed)
PT Cancellation Note  Patient Details Name: [name] MRN: [id number] DOB: 11-28-1955   Cancelled Treatment:    Reason Eval/Treat Not Completed: Active bedrest order   Felissa Blouch B Ladrea Holladay 01/05/2018, 7:09 AM  Elwyn Reach, [place]

## 2018-01-06 ENCOUNTER — Inpatient Hospital Stay (HOSPITAL_COMMUNITY): Payer: Medicare Other

## 2018-01-06 DIAGNOSIS — E785 Hyperlipidemia, unspecified: Secondary | ICD-10-CM

## 2018-01-06 DIAGNOSIS — R531 Weakness: Secondary | ICD-10-CM

## 2018-01-06 DIAGNOSIS — I10 Essential (primary) hypertension: Secondary | ICD-10-CM

## 2018-01-06 DIAGNOSIS — I610 Nontraumatic intracerebral hemorrhage in hemisphere, subcortical: Principal | ICD-10-CM

## 2018-01-06 LAB — BASIC METABOLIC PANEL
Anion gap: 8 (ref 5–15)
BUN: 10 mg/dL (ref 6–20)
CALCIUM: 8.3 mg/dL — AB (ref 8.9–10.3)
CO2: 23 mmol/L (ref 22–32)
CREATININE: 1.1 mg/dL — AB (ref 0.44–1.00)
Chloride: 107 mmol/L (ref 101–111)
GFR, EST NON AFRICAN AMERICAN: 53 mL/min — AB (ref >60)
GLUCOSE: 101 mg/dL — AB (ref 65–99)
Potassium: 3.3 mmol/L — ABNORMAL LOW (ref 3.5–5.1)
Sodium: 138 mmol/L (ref 135–145)

## 2018-01-06 LAB — GLUCOSE, CAPILLARY
GLUCOSE-CAPILLARY: 91 mg/dL (ref 65–99)
Glucose-Capillary: 70 mg/dL (ref 65–99)

## 2018-01-06 LAB — CBC
HCT: 35.5 % — ABNORMAL LOW (ref 36.0–46.0)
Hemoglobin: 11.3 g/dL — ABNORMAL LOW (ref 12.0–15.0)
MCH: 27.8 pg (ref 26.0–34.0)
MCHC: 31.8 g/dL (ref 30.0–36.0)
MCV: 87.2 fL (ref 78.0–100.0)
PLATELETS: 223 10*3/uL (ref 150–400)
RBC: 4.07 MIL/uL (ref 3.87–5.11)
RDW: 14.8 % (ref 11.5–15.5)
WBC: 6.5 10*3/uL (ref 4.0–10.5)

## 2018-01-06 LAB — LIPID PANEL
CHOL/HDL RATIO: 4.2 ratio
CHOLESTEROL: 179 mg/dL (ref 0–200)
HDL: 43 mg/dL (ref >40)
LDL CALC: 118 mg/dL — AB (ref 0–99)
Triglycerides: 92 mg/dL (ref <150)
VLDL: 18 mg/dL (ref 0–40)

## 2018-01-06 MED ORDER — ATORVASTATIN CALCIUM 40 MG PO TABS: 40.0000 mg | ORAL_TABLET | Freq: Every day | ORAL | Status: DC

## 2018-01-06 MED ORDER — AMLODIPINE BESYLATE 10 MG PO TABS
10.0000 mg | ORAL_TABLET | Freq: Two times a day (BID) | ORAL | Status: DC
  Filled 2018-01-06: qty 1

## 2018-01-06 MED ORDER — ATORVASTATIN CALCIUM 40 MG PO TABS: 40.0000 mg | ORAL_TABLET | Freq: Every day | ORAL | 1 refills | Status: DC

## 2018-01-06 MED ORDER — POTASSIUM CHLORIDE CRYS ER 20 MEQ PO TBCR: 40.0000 meq | EXTENDED_RELEASE_TABLET | Freq: Two times a day (BID) | ORAL | 0 refills | Status: DC

## 2018-01-06 MED ORDER — HYDROCHLOROTHIAZIDE 25 MG PO TABS: 25.0000 mg | ORAL_TABLET | Freq: Every day | ORAL | 1 refills | Status: DC

## 2018-01-06 MED ORDER — POTASSIUM CHLORIDE CRYS ER 20 MEQ PO TBCR
40.0000 meq | EXTENDED_RELEASE_TABLET | Freq: Two times a day (BID) | ORAL | Status: DC
  Administered 2018-01-06: 40 meq via ORAL
  Filled 2018-01-06: qty 2

## 2018-01-06 NOTE — Progress Notes (Signed)
Discharge home. Home discharge instruction given, no question verbalized. 

## 2018-01-06 NOTE — Care Management Note (Signed)
Case Management Note  Patient Details  Name: [name] MRN: [id number] Date of Birth: [birth year]  Subjective/Objective:  ICH       Action/Plan: Patient discharging home today; CM talked to patient for [facility], pt chose [place] for [facility] services; CM called Jermaine with [facility] for arrangements and DME - rolling walker and 3:1;  Expected Discharge Date:  01/06/18               Expected Discharge Plan:  [place]  Discharge planning Services  CM Consult  Choice offered to:  Patient  DME Arranged:  Walker rolling, 3-N-1 DME Agency:  [place] Arranged:  PT, OT [facility] Agency:  [place]  Status of Service:  In process, will continue to follow  Sherrilyn Rist [phone] 01/06/2018, 11:18 AM

## 2018-01-06 NOTE — Progress Notes (Signed)
Occupational Therapy Treatment Patient Details Name: [name] MRN: [id number] DOB: 02/16/1956 Today's Date: 01/06/2018    History of present illness 62 yo admitted after falls with left weakness and right lentiform nucleus ICH. PMHx: CKD stage II, HTN, DM, depression, renal CA   OT comments  Pt making good progress but continues to demonstrate sensorimotor deficits with L side. Continue to recommend [place]. Pt in agreement.   Follow Up Recommendations  Home health OT;Supervision - Intermittent    Equipment Recommendations  3 in 1 bedside commode    Recommendations for Other Services      Precautions / Restrictions Precautions Precautions: Fall Precaution Comments: left inattention       Mobility Bed Mobility Overal bed mobility: Modified Independent                Transfers Overall transfer level: Needs assistance   Transfers: Sit to/from Stand Sit to Stand: Supervision              Balance Overall balance assessment: Needs assistance           Standing balance-Leahy Scale: Fair                             ADL either performed or assessed with clinical judgement   ADL                           Toilet Transfer: Supervision/safety;RW;Comfort height toilet;Ambulation           Functional mobility during ADLs: Supervision/safety;Rolling walker General ADL Comments: Increased independence with LB ADL with use of AD; Pt overall set up with ADL; noted L inattention during session; good use of vc to increase awareness of L inattention; Recommend pt use 3in1 as showe seat - pt verbalzied understanding. Pt educated on importance of visually being aware of L side during mobility and ADL. Educated on need to use walker bag.      Vision   Additional Comments: appeasrs intact   Perception     Praxis      Cognition Arousal/Alertness: Awake/alert Behavior During Therapy: WFL for tasks assessed/performed Overall  Cognitive Status: Impaired/Different from baseline Area of Impairment: Safety/judgement;Attention                   Current Attention Level: Selective     Safety/Judgement: Decreased awareness of deficits;Decreased awareness of safety     General Comments: L inattention noted during tasks        Exercises Exercises: Other exercises Other Exercises Other Exercises: activities to increase awareness of L side   Shoulder Instructions       General Comments      Pertinent Vitals/ Pain       Pain Assessment: 0-10 Pain Score: 4  Pain Location: left knee Pain Descriptors / Indicators: Aching;Sore Pain Intervention(s): Limited activity within patient's tolerance  Home Living                                          Prior Functioning/Environment              Frequency  Min 3X/week        Progress Toward Goals  OT Goals(current goals can now be found in the care plan section)  Progress towards OT goals: Progressing toward goals  Acute Rehab OT Goals Patient Stated Goal: to be there for her grandsons OT Goal Formulation: With patient Time For Goal Achievement: 01/19/18 Potential to Achieve Goals: Good ADL Goals Pt Will Perform Lower Body Bathing: with modified independence;sit to/from stand Pt Will Perform Lower Body Dressing: with modified independence;sit to/from stand;with adaptive equipment Pt Will Transfer to Toilet: with modified independence;ambulating;bedside commode  Plan Discharge plan remains appropriate    Co-evaluation                 AM-PAC PT "6 Clicks" Daily Activity     Outcome Measure   Help from another person eating meals?: None Help from another person taking care of personal grooming?: A Little Help from another person toileting, which includes using toliet, bedpan, or urinal?: A Little Help from another person bathing (including washing, rinsing, drying)?: A Little Help from another person to put on  and taking off regular upper body clothing?: A Little Help from another person to put on and taking off regular lower body clothing?: A Little 6 Click Score: 19    End of Session Equipment Utilized During Treatment: Gait belt;Rolling walker  OT Visit Diagnosis: Unsteadiness on feet (R26.81);Muscle weakness (generalized) (M62.81);Other symptoms and signs involving cognitive function;Dizziness and giddiness (R42)   Activity Tolerance Patient tolerated treatment well   Patient Left in bed;with call bell/phone within reach;with bed alarm set   Nurse Communication Mobility status        Time: 0093-8182 OT Time Calculation (min): 25 min  Charges: OT General Charges $OT Visit: 1 Visit OT Treatments $Self Care/Home Management : 23-37 mins  [facility], OT/L  [phone] 01/06/2018   Jedi Catalfamo,HILLARY 01/06/2018, 2:09 PM

## 2018-01-06 NOTE — Progress Notes (Signed)
VASCULAR LAB PRELIMINARY  PRELIMINARY  PRELIMINARY  PRELIMINARY  Carotid duplex completed.    Preliminary report:  1-39% ICA plaquing. Vertebral artery flow is antegrade.   Renato Spellman, RVT 01/06/2018, 1:46 PM

## 2018-01-06 NOTE — Discharge Summary (Addendum)
Stroke Discharge Summary  Patient ID: [name]   MRN: [id number]      DOB: 05/13/1956  Date of Admission: 01/04/2018 Date of Discharge: 01/06/2018  Attending Physician:  Garvin Fila, MD, Stroke MD Consultant(s):    None  Patient's PCP:  [facility]  DISCHARGE DIAGNOSIS:    ICH (intracerebral hemorrhage) ([place]) - right temporal and lateral BG ICH  Active Problems: Hypertension Hyperlipidemia Diabetes Obesity  Past Medical History:  Diagnosis Date  . Anemia   . Blood transfusion   . Blood transfusion without reported diagnosis   . CKD (chronic kidney disease) stage 2, GFR 60-89 ml/min   . Depression   . Diabetes mellitus   . Diverticulosis of colon with hemorrhage 2010, 09/2011  . H/O hiatal hernia   . Helicobacter pylori gastritis 09/13/2013  . Hx: UTI (urinary tract infection)   . Hypertension   . IBS (irritable bowel syndrome)   . Personal history of colonic polyps    adenomas since 2010  . Renal cell carcinoma 2004   lt kidney removed   Past Surgical History:  Procedure Laterality Date  . ABDOMINAL HYSTERECTOMY    . ABDOMINAL HYSTERECTOMY    . CHOLECYSTECTOMY    . COLONOSCOPY  12/19/2008   diverticulosis  . COLONOSCOPY  11/17/2011   Procedure: COLONOSCOPY;  Surgeon: Zenovia Jarred, MD;  Location: [facility];  Service: Gastroenterology;  Laterality: N/A;  . DILATION AND CURETTAGE OF UTERUS N/A 07/11/2014   Procedure: Repair of Vaginal Cuff;  Surgeon: Melina Schools, MD;  Location: [facility];  Service: Gynecology;  Laterality: N/A;  . FLEXIBLE SIGMOIDOSCOPY  12/12/2008   diverticulosis  . HAND SURGERY     x5 right  . HYDRADENITIS EXCISION Right 03/21/2014   Procedure: EXCISION HIDRADENITIS AXILLA;  Surgeon: Pedro Earls, MD;  Location: [facility];  Service: General;  Laterality: Right;  . KNEE ARTHROSCOPY     x2 left  . NEPHRECTOMY     left  . TRIGGER FINGER RELEASE Left 06/23/2015   Procedure: RELEASE  TRIGGER FINGER/A-1 PULLEY LEFT RING FINGER;  Surgeon: Daryll Brod, MD;  Location: [facility];  Service: Orthopedics;  Laterality: Left;  . TUBAL LIGATION    . UPPER GASTROINTESTINAL ENDOSCOPY  12/12/2008   gastroporesis    Allergies as of 01/06/2018   No Known Allergies     Medication List    STOP taking these medications   amLODipine 5 MG tablet Commonly known as:  NORVASC   BYDUREON 2 MG Pen Generic drug:  Exenatide ER   glucose blood test strip Commonly known as:  ONE TOUCH ULTRA TEST   Insulin Syringe-Needle U-100 31G X 5/16" 0.5 ML Misc Commonly known as:  RELION INSULIN SYR 0.5ML/31G   lisinopril-hydrochlorothiazide 20-12.5 MG tablet Commonly known as:  PRINZIDE,ZESTORETIC   NOVOLOG MIX 70/30 (70-30) 100 UNIT/ML injection Generic drug:  insulin aspart protamine- aspart   ondansetron 4 MG tablet Commonly known as:  ZOFRAN   ONE TOUCH ULTRA MINI w/Device Kit   ONETOUCH DELICA LANCETS FINE Misc     TAKE these medications   atorvastatin 40 MG tablet Commonly known as:  LIPITOR Take 1 tablet (40 mg total) by mouth daily at 6 PM.   hydrochlorothiazide 25 MG tablet Commonly known as:  HYDRODIURIL Take 1 tablet (25 mg total) by mouth daily.   lisinopril 40 MG tablet Commonly known as:  PRINIVIL,ZESTRIL Take 1 tablet (40 mg total) by mouth  daily.   metoprolol tartrate 25 MG tablet Commonly known as:  LOPRESSOR Take 0.5 tablets (12.5 mg total) by mouth 2 (two) times daily.   pantoprazole 40 MG tablet Commonly known as:  PROTONIX Take 1 tablet (40 mg total) by mouth daily.   potassium chloride SA 20 MEQ tablet Commonly known as:  K-DUR,KLOR-CON Take 2 tablets (40 mEq total) by mouth 2 (two) times daily.            Durable Medical Equipment  (From admission, onward)        Start     Ordered   01/06/18 1117  For home use only DME 3 n 1  Once     01/06/18 1117   01/06/18 1117  For home use only DME Walker rolling  Once    Question:   Patient needs a walker to treat with the following condition  Answer:  ICH (intracerebral hemorrhage) ([place])   01/06/18 1117     LABORATORY STUDIES CBC    Component Value Date/Time   WBC 6.5 01/06/2018 0320   RBC 4.07 01/06/2018 0320   HGB 11.3 (L) 01/06/2018 0320   HCT 35.5 (L) 01/06/2018 0320   PLT 223 01/06/2018 0320   MCV 87.2 01/06/2018 0320   MCH 27.8 01/06/2018 0320   MCHC 31.8 01/06/2018 0320   RDW 14.8 01/06/2018 0320   LYMPHSABS 2.3 01/04/2018 0950   MONOABS 0.5 01/04/2018 0950   EOSABS 0.0 01/04/2018 0950   BASOSABS 0.0 01/04/2018 0950   CMP    Component Value Date/Time   NA 138 01/06/2018 0320   K 3.3 (L) 01/06/2018 0320   CL 107 01/06/2018 0320   CO2 23 01/06/2018 0320   GLUCOSE 101 (H) 01/06/2018 0320   BUN 10 01/06/2018 0320   CREATININE 1.10 (H) 01/06/2018 0320   CALCIUM 8.3 (L) 01/06/2018 0320   PROT 6.8 01/04/2018 0950   ALBUMIN 2.8 (L) 01/04/2018 0950   AST 32 01/04/2018 0950   ALT 19 01/04/2018 0950   ALKPHOS 83 01/04/2018 0950   BILITOT 0.5 01/04/2018 0950   GFRNONAA 53 (L) 01/06/2018 0320   GFRAA >60 01/06/2018 0320   COAGS Lab Results  Component Value Date   INR 0.97 01/04/2018   INR 1.09 08/06/2016   INR 1.00 03/22/2014   Lipid Panel    Component Value Date/Time   CHOL 179 01/06/2018 0320   TRIG 92 01/06/2018 0320   HDL 43 01/06/2018 0320   CHOLHDL 4.2 01/06/2018 0320   VLDL 18 01/06/2018 0320   LDLCALC 118 (H) 01/06/2018 0320   HgbA1C  Lab Results  Component Value Date   HGBA1C 12.2 (H) 10/26/2015   Urinalysis    Component Value Date/Time   COLORURINE YELLOW 01/04/2018 0929   APPEARANCEUR CLEAR 01/04/2018 0929   LABSPEC 1.012 01/04/2018 0929   PHURINE 6.0 01/04/2018 0929   GLUCOSEU 50 (A) 01/04/2018 0929   GLUCOSEU NEGATIVE 07/28/2009 1005   HGBUR MODERATE (A) 01/04/2018 0929   BILIRUBINUR NEGATIVE 01/04/2018 0929   BILIRUBINUR  08/20/2013 1147     Comment:     Limited sample, not performed   KETONESUR NEGATIVE  01/04/2018 0929   PROTEINUR >=300 (A) 01/04/2018 0929   UROBILINOGEN 0.2 03/01/2014 1303   NITRITE NEGATIVE 01/04/2018 0929   LEUKOCYTESUR NEGATIVE 01/04/2018 0929   Urine Drug Screen     Component Value Date/Time   LABOPIA NONE DETECTED 01/04/2018 0929   COCAINSCRNUR NONE DETECTED 01/04/2018 0929   LABBENZ NONE DETECTED 01/04/2018 0929   AMPHETMU  NONE DETECTED 01/04/2018 0929   THCU NONE DETECTED 01/04/2018 0929   LABBARB NONE DETECTED 01/04/2018 0929    Alcohol Level    Component Value Date/Time   ETH <10 01/04/2018 1037   SIGNIFICANT DIAGNOSTIC STUDIES Ct Angio Head W Or Wo Contrast Result Date: 01/04/2018  IMPRESSION: Right lateral basal ganglia hemorrhage is stable. No underlying vascular malformation or tumor.   Ct Head Wo Contrast Result Date: 01/05/2018 IMPRESSION: No change in a right posterior basal ganglia/radiating white matter track intraparenchymal hematoma, measuring 3.3 x 2.3 x 1.5 cm with mild surrounding edema and mild mass effect with right-to-left shift of 2 mm.   Ct Head Wo Contrast Result Date: 01/04/2018 IMPRESSION: 1. Focal area of hemorrhage, likely an acute hemorrhagic infarct, arising in the posterior right lentiform nucleus. 2. Atrophy with patchy supratentorial small vessel disease. No acute infarct beyond the presumed hemorrhagic infarct in the right lentiform nucleus. 3. No hemorrhagic focus elsewhere. No extra-axial fluid collection or midline shift. 4.  Foci of arterial vascular calcification noted. 5.  Mucosal thickening in several ethmoid air cells.   Dg Chest Port 1 View Result Date: 01/05/2018  IMPRESSION: No active disease.  Echocardiogram:                                               Study Conclusions - Left ventricle: The cavity size was normal. There was mild   concentric hypertrophy. Systolic function was normal. The   estimated ejection fraction was in the range of 60% to 65%. Wall   motion was normal; there were no regional wall  motion   abnormalities. Doppler parameters are consistent with abnormal   left ventricular relaxation (grade 1 diastolic dysfunction). - Aortic valve: There was trivial regurgitation. - Left atrium: The atrium was mildly dilated.  B/L Carotid U/S:  1-39% ICA plaquing. Vertebral artery flow is antegrade                                              HISTORY OF PRESENT ILLNESS   &    HOSPITAL COURSE ADMISSION HISTORY:  [name] is a 62 y.o. female past history of CKD stage II, hypertension, diabetes, depression, who was last known normal last night, was brought in by EMS when she woke up this morning and had a fall.  She said that she fell multiple times this morning and felt weak on one side of the body on the left. She denies any headaches.  She denies any visual changes.  She says she has not been compliant to her hypertensive medications as she has been sick with some abdominal problems and has been getting some workup including abdominal CTs the last few days.   No history of strokes in the past.  LKW: Night prior to presentation tpa given?: no, ICH Premorbid modified Rankin scale (mRS): *0 ICH Score: 0 NIHSS - 2  IMPRESSION: [name] is a 62 y.o. female with PMH of uncontrolled hypertension, diabetes presents with complaints of multiple falls secondary to left-sided weakness. CT Head reveals:  Acute  posterior right lentiform nucleus hemorrhage due to hypertension  Suspected Etiology: likely hypertensive hemorrhage.   Resultant Symptoms: left-sided weakness.  Stroke Risk Factors: diabetes mellitus, hyperlipidemia and hypertension Other  Stroke Risk Factors: Advanced age, Obesity, CKD, Hx Renal Cell Cancer  Outstanding Stroke Work-up Studies:    work up completed  PLAN  01/05/2018: Continue Statin No Antiplatelet therapy at this time due to [place] Discharge home today with family PT/OT/SLP Consult Case Management /MSW Ongoing aggressive stroke risk factor  management Patient counseled to be compliant withher medications Patient counseled on Lifestyle modifications including, Diet, Exercise, and Stress Follow up with [facility] in 6 weeks  RESP No acute issues Monitor clinically  CKD -Gentle IV hydration Avoid Nephrotoxic meds PCP to monitor  HEME Mild Anemia, likely due to IVF's No acute issues PCP to monitor  ENDO Type 2 diabetes mellitus with hyperglycemia  -goal HgbA1c < 7 -SSI  Fluid/Electrolyte Disorders -K+ Repletion in progress -Repeat labs  ID No active issues   Prophylaxis DVT: SCDs.  No antiplatelets or anticoagulants due to [place]. GI: Not applicable Bowel: Docusate senna  HYPERTENSION: Stable SBP goal less than 140/90  Nicardipine drip, Labetolol PRN Long term BP goal normotensive. May slowly restart home B/P medications after 48 hours Home Meds: Prizide, Norvasc  HYPERLIPIDEMIA: Labs(Brief)          Component Value Date/Time   CHOL 196 10/26/2015 1432   TRIG 140.0 10/26/2015 1432   HDL 45.40 10/26/2015 1432   CHOLHDL 4 10/26/2015 1432   VLDL 28.0 10/26/2015 1432   LDLCALC 122 (H) 10/26/2015 1432    Home Meds:  NONE LDL  goal < 70 Will likely start on Lipitor 40 mg daily in AM Continue statin at discharge  DIABETES: RecentLabs       Lab Results  Component Value Date   HGBA1C 12.2 (H) 10/26/2015    HgbA1c goal < 7.0 Currently on: Novolog Continue CBG monitoring and SSI to maintain glucose 140-180 mg/dl DM education   OBESITY Obesity, 99.8 kg Dietary consultation  Other Active Problems: Active Problems:   ICH (intracerebral hemorrhage) ([facility])  DISCHARGE EXAM Blood pressure 138/63, pulse 88, temperature 98.4 F (36.9 C), temperature source Oral, resp. rate 18, last menstrual period 11/16/2002, SpO2 100 %. General - pleasant middle-aged African-American lady, in no apparent distress HEENT-  Normocephalic,   Cardiovascular - Regular rate and  rhythm  Respiratory - Lungs clear bilaterally. No wheezing. Abdomen - soft and non-tender, BS normal Extremities- no edema or cyanosis Mental Status: AA&Ox3  Language: speech is not dysarthric naming, repetition, fluency, and comprehension intact. Cranial Nerves: PERRL. EOMI, visual fields full, no facial asymmetry facial sensation intact, hearing intact, tongue/uvula/soft palate midline, normal sternocleidomastoid and trapezius muscle strength. No evidence of tongue atrophy or fibrillations Motor: 4/5 left upper extremity, 4+/5 left lower extremity, 5/5 right upper extremity, 5/5 right lower extremity. Diminished fine finger movements on the left. Orbits right over left upper extremity. Tone: is normal and bulk is normal Sensation- Intact to light touch bilaterally but reports some subjective heaviness on the left arm and leg Coordination: FTN intact bilaterally Gait- deferred  Discharge Diet   Fall precautions Diet Carb Modified Fluid consistency: Thin; Room service appropriate? Yes liquids  DISCHARGE PLAN  Disposition:  HOME with HH  No antithrombotic and due to [place] for secondary stroke prevention.  Ongoing risk factor control by Primary Care Physician at time of discharge  [facility] [place] in 2 weeks.  Follow-up with Dr. Antony Contras, Stroke Clinic in 6 weeks, office to schedule an appointment.  Greater than 30 minutes were spent preparing discharge.  Renie Ora Stroke Neurology  Team 01/06/2018 3:03 PM   ATTENDING NOTE: I reviewed above note and agree with the assessment and plan. I have made any additions or clarifications directly to the above note. Pt was seen and examined.   62 year old female with history of CKD, anemia, DM, HTN, IBS, renal cell carcinoma status post left kidney removal admitted for falls, left-sided weakness and abdominal hernia.  Abdominal CT negative except diverticulosis.  CT head showed right  temporal/left BG ICH.  Repeat CT stable.  CTA head negative for aneurysm or AVM.  EF 60-65%.  Carotid Doppler negative.  UDS negative.  LDL 118 and A1c pending.  Creatinine 1.09.   Patient currently on NIHSS=1 to 2 left lower extremity numbness.  BP stable on metoprolol 12.5 twice daily, lisinopril 40 HCTZ 25 daily.  Patient refused amlodipine.  Neuro stable, ready to discharge home with home health. Pt will follow up with Dr[facility] Leonie [facility] at [facility] in about 6 weeks. Thanks for the consult.  Rosalin Hawking, MD PhD Stroke Neurology 01/06/2018 11:23 PM

## 2018-01-07 LAB — HEMOGLOBIN A1C
HEMOGLOBIN A1C: 7 % — AB (ref 4.8–5.6)
MEAN PLASMA GLUCOSE: 154 mg/dL

## 2018-01-09 ENCOUNTER — Telehealth: Payer: Self-pay

## 2018-01-09 NOTE — Telephone Encounter (Signed)
Notes recorded by Marval Regal, RN on 01/09/2018 at 9:55 AM EST LEft vm to call back about vas US carotid results.

## 2018-01-09 NOTE — Telephone Encounter (Signed)
-----   Message from Garvin Fila, MD sent at 01/08/2018  2:52 PM EST ----- Kindly let the patient noted that her blood tests for diabetes control shows improvement over the previous one 2 years ago but it is still not optimal and she needs to discuss diabetes controlled with her primary physician

## 2018-01-09 NOTE — Telephone Encounter (Signed)
Notes recorded by Marval Regal, RN on 01/09/2018 at 9:53 AM EST Left vm for patient to call back about lab work results.

## 2018-01-10 NOTE — Telephone Encounter (Signed)
Notes recorded by Marval Regal, RN on 01/10/2018 at 3:29 PM EST Rn call patient about her vas US carotid results. Rn stated the patient noted carotid ultrasound showed no significant blockages to worry about. PT verbalized understanding. ------

## 2018-01-10 NOTE — Telephone Encounter (Signed)
-----   Message from Garvin Fila, MD sent at 01/08/2018  2:52 PM EST ----- Kindly let the patient noted that her blood tests for diabetes control shows improvement over the previous one 2 years ago but it is still not optimal and she needs to discuss diabetes controlled with her primary physician

## 2018-01-10 NOTE — Telephone Encounter (Signed)
Notes recorded by Marval Regal, RN on 01/10/2018 at 3:28 PM EST Rn call patient about her blood test HGA1C has showed improvement over the past 2 years. Dr.Sethi wants her to discuss the 7.0 level with her PCP,and continue to do better diabetes controlled. Pt verbalized understanding,and will contact her PCP.

## 2018-02-12 NOTE — Progress Notes (Signed)
[facility] [address] [place]. [place] [address] [phone]       OFFICE FOLLOW UP NOTE  Ms. [name] Date of Birth:  03-03-1956 Medical Record Number:  [id number]   Reason for Referral:  hospital stroke follow up  CHIEF COMPLAINT:  Chief Complaint  Patient presents with  . Stroke    HPI: [name] is being seen today for initial visit in the office for acute posterior right lentiform nucleus hemorrhage on 01/04/18. History obtained from patient and chart review. Reviewed all radiology images and labs personally.  [name] a 62 y.o.femalepast history of CKD stage II, hypertension, diabetes, depression, who was last known normal last night on 01/03/18, was brought in by EMS when she woke up this morning on 01/04/18 and had a fall. She said that she fell multiple times this morning and felt weak on one side of the body on the left. She denies any headaches. She denies any visual changes. She had recent pain from previous umbilical hernia which increased her blood pressure but does states she was compliant on hypertension medications. No history of strokes in the past. CT head reviewed and showed focal area of hemorrhage, likely an acute hemorrhagic infarct. CTA head showed right lateral basal ganglia hemorrhage which was stable. 2D echo showed an EF of 60-65%. B/L carotid U/S showed bilateral 1-39% ICA plaquing. LDL 118 and A1c 7.0.  No antithrombotic after ICH. Start on lipitor 40mg . Patient d/c'd home in stable conditions.   Since discharge patient has been doing well.  She continues to take Lipitor without increase in myalgias.  Blood pressure today satisfactory 135/83.  Does monitor this when she goes to [facility] or [facility] and is considering purchasing an at home blood pressure cuff.  She does complain of left leg numbness with a burning sensation that goes down from her hip/lower back and radiates down into her foot.  She states that  she does have a history of neuropathy and that this started approximately 2 weeks ago.  She states that she was compliant with blood pressure medications prior to her [place] but she was having increased pain from umbilical hernia and this made her blood pressure increase.  No new or worsening stroke/TIA symptoms.  ROS:   14 system review of systems performed and negative with exception of runny nose, frequent waking, sleep talking, numbness  PMH:  Past Medical History:  Diagnosis Date  . Anemia   . Blood transfusion   . Blood transfusion without reported diagnosis   . CKD (chronic kidney disease) stage 2, GFR 60-89 ml/min   . Depression   . Diabetes mellitus   . Diverticulosis of colon with hemorrhage 2010, 09/2011  . H/O hiatal hernia   . Helicobacter pylori gastritis 09/13/2013  . Hx: UTI (urinary tract infection)   . Hypertension   . IBS (irritable bowel syndrome)   . Personal history of colonic polyps    adenomas since 2010  . Renal cell carcinoma 2004   lt kidney removed  . Stroke [facility]) 12/2017    PSH:  Past Surgical History:  Procedure Laterality Date  . ABDOMINAL HYSTERECTOMY    . ABDOMINAL HYSTERECTOMY    . CHOLECYSTECTOMY    . COLONOSCOPY  12/19/2008   diverticulosis  . COLONOSCOPY  11/17/2011   Procedure: COLONOSCOPY;  Surgeon: Zenovia Jarred, MD;  Location: [facility];  Service: Gastroenterology;  Laterality: N/A;  . DILATION AND CURETTAGE OF UTERUS N/A 07/11/2014   Procedure: Repair of Vaginal Cuff;  Surgeon: Melina Schools, MD;  Location: [facility];  Service: Gynecology;  Laterality: N/A;  . FLEXIBLE SIGMOIDOSCOPY  12/12/2008   diverticulosis  . HAND SURGERY     x5 right  . HYDRADENITIS EXCISION Right 03/21/2014   Procedure: EXCISION HIDRADENITIS AXILLA;  Surgeon: Pedro Earls, MD;  Location: [facility];  Service: General;  Laterality: Right;  . KNEE ARTHROSCOPY     x2 left  . NEPHRECTOMY     left  . TRIGGER FINGER RELEASE Left 06/23/2015   Procedure:  RELEASE TRIGGER FINGER/A-1 PULLEY LEFT RING FINGER;  Surgeon: Daryll Brod, MD;  Location: [place];  Service: Orthopedics;  Laterality: Left;  . TUBAL LIGATION    . UPPER GASTROINTESTINAL ENDOSCOPY  12/12/2008   gastroporesis    Social History:  Social History   Socioeconomic History  . Marital status: Divorced    Spouse name: Not on file  . Number of children: 3  . Years of education: Not on file  . Highest education level: Not on file  Occupational History  . Occupation: Occupational psychologist  Social Needs  . Financial resource strain: Not on file  . Food insecurity:    Worry: Not on file    Inability: Not on file  . Transportation needs:    Medical: Not on file    Non-medical: Not on file  Tobacco Use  . Smoking status: Never Smoker  . Smokeless tobacco: Never Used  Substance and Sexual Activity  . Alcohol use: No    Alcohol/week: 0.0 oz  . Drug use: No  . Sexual activity: Never  Lifestyle  . Physical activity:    Days per week: Not on file    Minutes per session: Not on file  . Stress: Not on file  Relationships  . Social connections:    Talks on phone: Not on file    Gets together: Not on file    Attends religious service: Not on file    Active member of club or organization: Not on file    Attends meetings of clubs or organizations: Not on file    Relationship status: Not on file  . Intimate partner violence:    Fear of current or ex partner: Not on file    Emotionally abused: Not on file    Physically abused: Not on file    Forced sexual activity: Not on file  Other Topics Concern  . Not on file  Social History Narrative   Regular exercise: walk 2 days, aerobics 3 days[facility]   Caffeine use: coffee every AM    Family History:  Family History  Problem Relation Age of Onset  . Colon polyps Mother   . Diabetes Maternal Grandmother   . Breast cancer Maternal Grandmother   . Diabetes Father   . Breast cancer Daughter   .  Colon cancer Neg Hx     Medications:   Current Outpatient Medications on File Prior to Visit  Medication Sig Dispense Refill  . atorvastatin (LIPITOR) 40 MG tablet Take 1 tablet (40 mg total) by mouth daily at 6 PM. 30 tablet 1  . BYDUREON 2 MG PEN 2 mg. weekly    . hydrochlorothiazide (HYDRODIURIL) 25 MG tablet Take 1 tablet (25 mg total) by mouth daily. 30 tablet 1  . lisinopril (PRINIVIL,ZESTRIL) 40 MG tablet Take 1 tablet (40 mg total) by mouth daily. 30 tablet 0  . pantoprazole (PROTONIX) 40 MG tablet Take 1 tablet (40  mg total) by mouth daily. (Patient not taking: Reported on 12/21/2016) 15 tablet 0   No current facility-administered medications on file prior to visit.     Allergies:  No Known Allergies   Physical Exam  Vitals:   02/13/18 0943  Pulse: 81  Weight: 223 lb 6.4 oz (101.3 kg)  Height: 5\' 1"  (1.549 m)   Body mass index is 42.21 kg/m. No exam data present  General: well developed, pleasant middle aged [place] American female, well nourished, seated, in no evident distress Head: head normocephalic and atraumatic.   Neck: supple with no carotid or supraclavicular bruits Cardiovascular: regular rate and rhythm, no murmurs Musculoskeletal: no deformity Skin:  no rash/petichiae, well-appearing healing wound from previous burn Vascular:  Normal pulses all extremities  Neurologic Exam Mental Status: Awake and fully alert. Oriented to place and time. Recent and remote memory intact. Attention span, concentration and fund of knowledge appropriate. Mood and affect appropriate.  Cranial Nerves: Fundoscopic exam reveals sharp disc margins. Pupils equal, briskly reactive to light. Extraocular movements full without nystagmus. Visual fields full to confrontation. Hearing intact. Facial sensation intact. Face, tongue, palate moves normally and symmetrically.  Motor: Normal bulk and tone. Normal strength in all tested extremity muscles. Sensory.: intact to touch , pinprick ,  position and vibratory sensation.  Coordination: Rapid alternating movements normal in all extremities. Finger-to-nose and heel-to-shin performed accurately bilaterally. Gait and Station: Arises from chair without difficulty. Stance is normal. Gait demonstrates normal stride length and balance . Able to heel, toe and tandem walk without difficulty.  Reflexes: 1+ and symmetric. Toes downgoing.    NIHSS  0 Modified Rankin  1    Diagnostic Data (Labs, Imaging, Testing)  Ct Angio Head W Or Wo Contrast Result Date: 01/04/2018 IMPRESSION: Right lateral basal ganglia hemorrhage is stable. No underlying vascular malformation or tumor.   Ct Head Wo Contrast Result Date: 01/05/2018 IMPRESSION: No change in a right posterior basal ganglia/radiating white matter track intraparenchymal hematoma, measuring 3.3 x 2.3 x 1.5 cm with mild surrounding edema and mild mass effect with right-to-left shift of 2 mm.  Ct Head Wo Contrast Result Date: 01/04/2018 IMPRESSION: 1.Focal area of hemorrhage, likely an acute hemorrhagic infarct, arising in the posterior right lentiform nucleus. 2. Atrophy with patchy supratentorial small vessel disease. No acute infarct beyond the presumed hemorrhagic infarct in the right lentiform nucleus. 3. No hemorrhagic focus elsewhere. No extra-axial fluid collection or midline shift. 4. Foci of arterial vascular calcification noted. 5. Mucosal thickening in several ethmoid air cells.   Dg Chest Port 1 View Result Date: 01/05/2018 IMPRESSION: No active disease.  Echocardiogram: Study Conclusions - Left ventricle: The cavity size was normal. There was mild concentric hypertrophy. Systolic function was normal. The estimated ejection fraction was in the range of 60% to 65%. Wall motion was normal; there were no regional wall motion abnormalities. Doppler parameters are consistent with abnormal left ventricular  relaxation (grade 1 diastolic dysfunction). - Aortic valve: There was trivial regurgitation. - Left atrium: The atrium was mildly dilated.  B/L Carotid U/S:1-39% ICA plaquing. Vertebral artery flow is antegrade     ASSESSMENT: Avriel Kandel is a 62 y.o. year old female here with [place] on 01/04/18 secondary to hypertension. Vascular risk factors include HTN, HLD and DM.    PLAN: -Continue lipitor  for secondary stroke prevention -Gabapentin 300 mg at night for nerve pain  -repeat CT scan to assess for full resolution of hemorrhage -consider restarting antithrombotic/antiplatelet if resolution of hemorrhage -F/u  with PCP regarding your HLD, DM, and HTN management -continue to monitor BP at home -Maintain strict control of hypertension with blood pressure goal below 130/90, diabetes with hemoglobin A1c goal below 6.5% and cholesterol with LDL cholesterol (bad cholesterol) goal below 70 mg/dL. I also advised the patient to eat a healthy diet with plenty of whole grains, cereals, fruits and vegetables, exercise regularly and maintain ideal body weight.  Follow up in 4 months or call earlier if needed  Greater than 50% time during this 25 minute consultation visit was spent on counseling and coordination of care about HLD, DM and HTN, discussion about risk benefit of anticoagulation and answering questions.    Venancio Poisson, AGNP-BC  [facility] [address] [place], [place] [address]  Phone [phone] Fax [phone]

## 2018-02-13 ENCOUNTER — Ambulatory Visit: Payer: Medicare Other | Admitting: Adult Health

## 2018-02-13 ENCOUNTER — Telehealth: Payer: Self-pay | Admitting: Adult Health

## 2018-02-13 ENCOUNTER — Encounter: Payer: Self-pay | Admitting: Adult Health

## 2018-02-13 VITALS — BP 135/83 | HR 81 | Ht 61.0 in | Wt 223.4 lb

## 2018-02-13 DIAGNOSIS — E11319 Type 2 diabetes mellitus with unspecified diabetic retinopathy without macular edema: Secondary | ICD-10-CM

## 2018-02-13 DIAGNOSIS — I10 Essential (primary) hypertension: Secondary | ICD-10-CM | POA: Diagnosis not present

## 2018-02-13 DIAGNOSIS — E1165 Type 2 diabetes mellitus with hyperglycemia: Secondary | ICD-10-CM | POA: Diagnosis not present

## 2018-02-13 DIAGNOSIS — I610 Nontraumatic intracerebral hemorrhage in hemisphere, subcortical: Secondary | ICD-10-CM | POA: Diagnosis not present

## 2018-02-13 DIAGNOSIS — IMO0002 Reserved for concepts with insufficient information to code with codable children: Secondary | ICD-10-CM

## 2018-02-13 MED ORDER — GABAPENTIN 300 MG PO CAPS: 300.0000 mg | ORAL_CAPSULE | Freq: Every day | ORAL | 4 refills | Status: DC

## 2018-02-13 NOTE — Patient Instructions (Addendum)
Continue lipitor  for secondary stroke prevention  Repeat CT scan - we will call you for a an appointment  Start gabapentin 300mg  at night before bed for nerve pain - continue to do exercises to help relieve pain  Continue to monitor blood pressure at home  Continue exercise and eating healthy    Maintain strict control of hypertension with blood pressure goal below 130/90, diabetes with hemoglobin A1c goal below 6.5% and cholesterol with LDL cholesterol (bad cholesterol) goal below 70 mg/dL. I also advised the patient to eat a healthy diet with plenty of whole grains, cereals, fruits and vegetables, exercise regularly and maintain ideal body weight.  Followup in the future with me in 4 months or call earlier if needed

## 2018-02-13 NOTE — Progress Notes (Signed)
I reviewed above note and agree with the assessment and plan. Consider ASA 81mg  daily if ICH resolved on repeat CT head. Thanks.   Rosalin Hawking, MD PhD Stroke Neurology 02/13/2018 11:49 AM

## 2018-02-13 NOTE — Telephone Encounter (Signed)
[facility] Medicare order sent to GI no auth they will contact the pt to schedule.

## 2018-02-14 ENCOUNTER — Encounter: Payer: Self-pay | Admitting: Nurse Practitioner

## 2018-02-16 ENCOUNTER — Ambulatory Visit
Admission: RE | Admit: 2018-02-16 | Discharge: 2018-02-16 | Disposition: A | Discharge status: Home / Self Care | Payer: Medicare Other | Source: Ambulatory Visit | Attending: Adult Health | Admitting: Adult Health

## 2018-02-16 DIAGNOSIS — I610 Nontraumatic intracerebral hemorrhage in hemisphere, subcortical: Secondary | ICD-10-CM | POA: Diagnosis not present

## 2018-02-19 ENCOUNTER — Telehealth: Payer: Self-pay | Admitting: Adult Health

## 2018-02-19 MED ORDER — ASPIRIN EC 81 MG PO TBEC: 81.0000 mg | DELAYED_RELEASE_TABLET | Freq: Every day | ORAL | Status: DC

## 2018-02-19 NOTE — Telephone Encounter (Signed)
Called [name] to inform her that her CT scan looked good with resolution of previous ICH. She will start to take aspirin 81mg  for secondary stroke prevention. She voiced understanding and appreciative for call.

## 2018-02-26 ENCOUNTER — Telehealth: Payer: Self-pay | Admitting: Adult Health

## 2018-02-26 MED ORDER — GABAPENTIN 300 MG PO CAPS: 300.0000 mg | ORAL_CAPSULE | Freq: Three times a day (TID) | ORAL | 3 refills | Status: DC

## 2018-02-26 NOTE — Telephone Encounter (Signed)
Pt is asking for a call back re: her increase in pain.  Pt is now experiencing a burning sensation on outer part of left leg.  Pt states its wraps around her foot(not her arch) feels like heat is radiating from her leg.  Very bothersome and affects pt's sleep.  Pt states the pain medication does not touch her pain. Please call

## 2018-02-26 NOTE — Telephone Encounter (Signed)
Patient started gabapentin 300mg  at last visit (02/13/18). She continues to have nerve pain. She is tolerating current dose well without side effects. Recommended patient increase gabapentin dose to 300mg  TID. She verbalizes understanding and appreciates call.

## 2018-02-26 NOTE — Addendum Note (Signed)
Addended by: Venancio Poisson on: 02/26/2018 05:15 PM   Modules accepted: Orders

## 2018-06-14 NOTE — Progress Notes (Deleted)
[facility] [address] [place]. [place] [id number] [phone]       OFFICE FOLLOW UP NOTE  [name] Date of Birth:  [birth year] Medical Record Number:  [id number]   Reason for Referral:  hospital stroke follow up  CHIEF COMPLAINT:  No chief complaint on file.   HPI: [name] is being seen today  in the office for acute posterior right lentiform nucleus hemorrhage on 01/04/18. History obtained from patient and chart review. Reviewed all radiology images and labs personally.  [name] a 62 y.o.femalepast history of CKD stage II, hypertension, diabetes, depression, who was last known normal last night on 01/03/18, was brought in by EMS when she woke up this morning on 01/04/18 and had a fall. She said that she fell multiple times this morning and felt weak on one side of the body on the left. She denies any headaches. She denies any visual changes. She had recent pain from previous umbilical hernia which increased her blood pressure but does states she was compliant on hypertension medications. No history of strokes in the past. CT head reviewed and showed focal area of hemorrhage, likely an acute hemorrhagic infarct. CTA head showed right lateral basal ganglia hemorrhage which was stable. 2D echo showed an EF of 60-65%. B/L carotid U/S showed bilateral 1-39% ICA plaquing. LDL 118 and A1c 7.0.  No antithrombotic after ICH. Start on lipitor 40mg . Patient d/c'd home in stable conditions.   Since discharge patient has been doing well.  She continues to take Lipitor without increase in myalgias.  Blood pressure today satisfactory 135/83.  Does monitor this when she goes to [facility] or [facility] and is considering purchasing an at home blood pressure cuff.  She does complain of left leg numbness with a burning sensation that goes down from her hip/lower back and radiates down into her foot.  She states that she does have a history of neuropathy and  that this started approximately 2 weeks ago.  She states that she was compliant with blood pressure medications prior to her [place] but she was having increased pain from umbilical hernia and this made her blood pressure increase.  No new or worsening stroke/TIA symptoms.  Interval history: Patient did undergo CT scan which did show resolution of previous ICH and was recommended to restart aspirin 81 mg for secondary stroke prevention.    ROS:   14 system review of systems performed and negative with exception of runny nose, frequent waking, sleep talking, numbness  PMH:  Past Medical History:  Diagnosis Date  . Anemia   . Blood transfusion   . Blood transfusion without reported diagnosis   . CKD (chronic kidney disease) stage 2, GFR 60-89 ml/min   . Depression   . Diabetes mellitus   . Diverticulosis of colon with hemorrhage 2010, 09/2011  . H/O hiatal hernia   . Helicobacter pylori gastritis 09/13/2013  . Hx: UTI (urinary tract infection)   . Hypertension   . IBS (irritable bowel syndrome)   . Personal history of colonic polyps    adenomas since 2010  . Renal cell carcinoma 2004   lt kidney removed  . Stroke [facility]) 12/2017    PSH:  Past Surgical History:  Procedure Laterality Date  . ABDOMINAL HYSTERECTOMY    . ABDOMINAL HYSTERECTOMY    . CHOLECYSTECTOMY    . COLONOSCOPY  12/19/2008   diverticulosis  . COLONOSCOPY  11/17/2011   Procedure: COLONOSCOPY;  Surgeon: Zenovia Jarred, MD;  Location: [facility];  Service: Gastroenterology;  Laterality: N/A;  . DILATION AND CURETTAGE OF UTERUS N/A 07/11/2014   Procedure: Repair of Vaginal Cuff;  Surgeon: Melina Schools, MD;  Location: [facility];  Service: Gynecology;  Laterality: N/A;  . FLEXIBLE SIGMOIDOSCOPY  12/12/2008   diverticulosis  . HAND SURGERY     x5 right  . HYDRADENITIS EXCISION Right 03/21/2014   Procedure: EXCISION HIDRADENITIS AXILLA;  Surgeon: Pedro Earls, MD;  Location: [facility];  Service: General;   Laterality: Right;  . KNEE ARTHROSCOPY     x2 left  . NEPHRECTOMY     left  . TRIGGER FINGER RELEASE Left 06/23/2015   Procedure: RELEASE TRIGGER FINGER/A-1 PULLEY LEFT RING FINGER;  Surgeon: Daryll Brod, MD;  Location: [facility];  Service: Orthopedics;  Laterality: Left;  . TUBAL LIGATION    . UPPER GASTROINTESTINAL ENDOSCOPY  12/12/2008   gastroporesis    Social History:  Social History   Socioeconomic History  . Marital status: Divorced    Spouse name: Not on file  . Number of children: 3  . Years of education: Not on file  . Highest education level: Not on file  Occupational History  . Occupation: Occupational psychologist  Social Needs  . Financial resource strain: Not on file  . Food insecurity:    Worry: Not on file    Inability: Not on file  . Transportation needs:    Medical: Not on file    Non-medical: Not on file  Tobacco Use  . Smoking status: Never Smoker  . Smokeless tobacco: Never Used  Substance and Sexual Activity  . Alcohol use: No    Alcohol/week: 0.0 oz  . Drug use: No  . Sexual activity: Never  Lifestyle  . Physical activity:    Days per week: Not on file    Minutes per session: Not on file  . Stress: Not on file  Relationships  . Social connections:    Talks on phone: Not on file    Gets together: Not on file    Attends religious service: Not on file    Active member of club or organization: Not on file    Attends meetings of clubs or organizations: Not on file    Relationship status: Not on file  . Intimate partner violence:    Fear of current or ex partner: Not on file    Emotionally abused: Not on file    Physically abused: Not on file    Forced sexual activity: Not on file  Other Topics Concern  . Not on file  Social History Narrative   Regular exercise: walk 2 days, aerobics 3 days[facility]   Caffeine use: coffee every AM    Family History:  Family History  Problem Relation Age of Onset  . Colon polyps  Mother   . Diabetes Maternal Grandmother   . Breast cancer Maternal Grandmother   . Diabetes Father   . Breast cancer Daughter   . Colon cancer Neg Hx     Medications:   Current Outpatient Medications on File Prior to Visit  Medication Sig Dispense Refill  . aspirin EC 81 MG tablet Take 1 tablet (81 mg total) by mouth daily.    Marland Kitchen atorvastatin (LIPITOR) 40 MG tablet Take 1 tablet (40 mg total) by mouth daily at 6 PM. 30 tablet 1  . BYDUREON 2 MG PEN 2 mg. weekly    . gabapentin (NEURONTIN) 300 MG capsule Take 1  capsule (300 mg total) by mouth 3 (three) times daily. 90 capsule 3  . hydrochlorothiazide (HYDRODIURIL) 25 MG tablet Take 1 tablet (25 mg total) by mouth daily. 30 tablet 1  . lisinopril (PRINIVIL,ZESTRIL) 40 MG tablet Take 1 tablet (40 mg total) by mouth daily. 30 tablet 0  . pantoprazole (PROTONIX) 40 MG tablet Take 1 tablet (40 mg total) by mouth daily. (Patient not taking: Reported on 12/21/2016) 15 tablet 0   No current facility-administered medications on file prior to visit.     Allergies:  No Known Allergies   Physical Exam  There were no vitals filed for this visit. There is no height or weight on file to calculate BMI. No exam data present  General: well developed, pleasant middle aged [place] American female, well nourished, seated, in no evident distress Head: head normocephalic and atraumatic.   Neck: supple with no carotid or supraclavicular bruits Cardiovascular: regular rate and rhythm, no murmurs Musculoskeletal: no deformity Skin:  no rash/petichiae, well-appearing healing wound from previous burn Vascular:  Normal pulses all extremities  Neurologic Exam Mental Status: Awake and fully alert. Oriented to place and time. Recent and remote memory intact. Attention span, concentration and fund of knowledge appropriate. Mood and affect appropriate.  Cranial Nerves: Fundoscopic exam reveals sharp disc margins. Pupils equal, briskly reactive to light.  Extraocular movements full without nystagmus. Visual fields full to confrontation. Hearing intact. Facial sensation intact. Face, tongue, palate moves normally and symmetrically.  Motor: Normal bulk and tone. Normal strength in all tested extremity muscles. Sensory.: intact to touch , pinprick , position and vibratory sensation.  Coordination: Rapid alternating movements normal in all extremities. Finger-to-nose and heel-to-shin performed accurately bilaterally. Gait and Station: Arises from chair without difficulty. Stance is normal. Gait demonstrates normal stride length and balance . Able to heel, toe and tandem walk without difficulty.  Reflexes: 1+ and symmetric. Toes downgoing.    NIHSS  0 Modified Rankin  1    Diagnostic Data (Labs, Imaging, Testing)  Ct Angio Head W Or Wo Contrast Result Date: 01/04/2018 IMPRESSION: Right lateral basal ganglia hemorrhage is stable. No underlying vascular malformation or tumor.   Ct Head Wo Contrast Result Date: 01/05/2018 IMPRESSION: No change in a right posterior basal ganglia/radiating white matter track intraparenchymal hematoma, measuring 3.3 x 2.3 x 1.5 cm with mild surrounding edema and mild mass effect with right-to-left shift of 2 mm.  Ct Head Wo Contrast Result Date: 01/04/2018 IMPRESSION: 1.Focal area of hemorrhage, likely an acute hemorrhagic infarct, arising in the posterior right lentiform nucleus. 2. Atrophy with patchy supratentorial small vessel disease. No acute infarct beyond the presumed hemorrhagic infarct in the right lentiform nucleus. 3. No hemorrhagic focus elsewhere. No extra-axial fluid collection or midline shift. 4. Foci of arterial vascular calcification noted. 5. Mucosal thickening in several ethmoid air cells.   Dg Chest Port 1 View Result Date: 01/05/2018 IMPRESSION: No active disease.  Echocardiogram: Study Conclusions - Left ventricle: The cavity size was  normal. There was mild concentric hypertrophy. Systolic function was normal. The estimated ejection fraction was in the range of 60% to 65%. Wall motion was normal; there were no regional wall motion abnormalities. Doppler parameters are consistent with abnormal left ventricular relaxation (grade 1 diastolic dysfunction). - Aortic valve: There was trivial regurgitation. - Left atrium: The atrium was mildly dilated.  B/L Carotid U/S:1-39% ICA plaquing. Vertebral artery flow is antegrade     ASSESSMENT: [name] is a 62 y.o. year old female here with ICH  on 01/04/18 secondary to hypertension. Vascular risk factors include HTN, HLD and DM.    PLAN: -Continue lipitor  for secondary stroke prevention -Gabapentin 300 mg at night for nerve pain  -repeat CT scan to assess for full resolution of hemorrhage -consider restarting antithrombotic/antiplatelet if resolution of hemorrhage -F/u with PCP regarding your HLD, DM, and HTN management -continue to monitor BP at home -Maintain strict control of hypertension with blood pressure goal below 130/90, diabetes with hemoglobin A1c goal below 6.5% and cholesterol with LDL cholesterol (bad cholesterol) goal below 70 mg/dL. I also advised the patient to eat a healthy diet with plenty of whole grains, cereals, fruits and vegetables, exercise regularly and maintain ideal body weight.  Follow up in 4 months or call earlier if needed  Greater than 50% time during this 25 minute consultation visit was spent on counseling and coordination of care about HLD, DM and HTN, discussion about risk benefit of anticoagulation and answering questions.    Venancio Poisson, AGNP-BC  [facility] [address] [place], [place] [address]  Phone [phone] Fax [phone]

## 2018-06-15 ENCOUNTER — Ambulatory Visit: Payer: Medicare Other | Admitting: Adult Health

## 2018-06-19 ENCOUNTER — Ambulatory Visit: Payer: Medicare Other | Admitting: Adult Health

## 2018-06-20 ENCOUNTER — Encounter: Payer: Self-pay | Admitting: Adult Health

## 2018-07-05 ENCOUNTER — Encounter: Payer: Self-pay | Admitting: Adult Health

## 2018-07-05 ENCOUNTER — Ambulatory Visit: Payer: Medicare Other | Admitting: Adult Health

## 2018-07-05 VITALS — BP 134/89 | HR 92 | Ht 61.0 in | Wt 222.6 lb

## 2018-07-05 DIAGNOSIS — IMO0002 Reserved for concepts with insufficient information to code with codable children: Secondary | ICD-10-CM

## 2018-07-05 DIAGNOSIS — I610 Nontraumatic intracerebral hemorrhage in hemisphere, subcortical: Secondary | ICD-10-CM | POA: Diagnosis not present

## 2018-07-05 DIAGNOSIS — M5442 Lumbago with sciatica, left side: Secondary | ICD-10-CM | POA: Diagnosis not present

## 2018-07-05 DIAGNOSIS — E1165 Type 2 diabetes mellitus with hyperglycemia: Secondary | ICD-10-CM

## 2018-07-05 DIAGNOSIS — E11319 Type 2 diabetes mellitus with unspecified diabetic retinopathy without macular edema: Secondary | ICD-10-CM

## 2018-07-05 DIAGNOSIS — I10 Essential (primary) hypertension: Secondary | ICD-10-CM | POA: Diagnosis not present

## 2018-07-05 DIAGNOSIS — G8929 Other chronic pain: Secondary | ICD-10-CM

## 2018-07-05 NOTE — Patient Instructions (Addendum)
Continue aspirin 81 mg daily  and lipitor  for secondary stroke prevention  You will be called to schedule CT lumbar spine and schedule EMG/nerve conduction study  Continue to follow up with PCP regarding cholesterol and blood pressure management   Continue to monitor blood pressure at home  Maintain strict control of hypertension with blood pressure goal below 130/90, diabetes with hemoglobin A1c goal below 6.5% and cholesterol with LDL cholesterol (bad cholesterol) goal below 70 mg/dL. I also advised the patient to eat a healthy diet with plenty of whole grains, cereals, fruits and vegetables, exercise regularly and maintain ideal body weight.  Followup in the future with me in 6 months or call earlier if needed       Thank you for coming to see [place] at [facility]. I hope we have been able to provide you high quality care today.  You may receive a patient satisfaction survey over the next few weeks. We would appreciate your feedback and comments so that we may continue to improve ourselves and the health of our patients.

## 2018-07-05 NOTE — Progress Notes (Signed)
[facility] [address] [place]. [place] [address] [phone]       OFFICE FOLLOW UP NOTE  [name] Date of Birth:  [birth year] Medical Record Number:  [id number]   Reason for Referral:  hospital stroke follow up  CHIEF COMPLAINT:  Chief Complaint  Patient presents with  . Follow-up    Nontraumatic subcortical hemorrhage of right cerebral hemisphere follow up, pt alone    HPI: [name] is being seen today in the office for acute posterior right lentiform nucleus hemorrhage on 01/04/18. History obtained from patient and chart review. Reviewed all radiology images and labs personally.  [name] a 62 y.o.femalepast history of CKD stage II, hypertension, diabetes, depression, who was last known normal last night on 01/03/18, was brought in by EMS when she woke up this morning on 01/04/18 and had a fall. She said that she fell multiple times this morning and felt weak on one side of the body on the left. She denies any headaches. She denies any visual changes. She had recent pain from previous umbilical hernia which increased her blood pressure but does states she was compliant on hypertension medications. No history of strokes in the past. CT head reviewed and showed focal area of hemorrhage, likely an acute hemorrhagic infarct. CTA head showed right lateral basal ganglia hemorrhage which was stable. 2D echo showed an EF of 60-65%. B/L carotid U/S showed bilateral 1-39% ICA plaquing. LDL 118 and A1c 7.0.  No antithrombotic after ICH. Start on lipitor 40mg . Patient d/c'd home in stable conditions.    02/13/18 visit: Since discharge patient has been doing well.  She continues to take Lipitor without increase in myalgias.  Blood pressure today satisfactory 135/83.  Does monitor this when she goes to [facility] or [facility] and is considering purchasing an at home blood pressure cuff.  She does complain of left leg numbness with a burning sensation  that goes down from her hip/lower back and radiates down into her foot.  She states that she does have a history of neuropathy and that this started approximately 2 weeks ago.  She states that she was compliant with blood pressure medications prior to her [place] but she was having increased pain from umbilical hernia and this made her blood pressure increase.  No new or worsening stroke/TIA symptoms.  Interval history 07/05/2018: Patient returns today for follow-up visit.  CT head was obtained and as it showed resolution of previous ICH recommended to start aspirin 81 mg for secondary stroke prevention.  Was also recommended to start gabapentin 300 mg at previous visit for possible neuropathy or sciatica. patient called on 02/26/2018 stating that she was tolerating well but continued to have pain.  Recommended increasing dose of gabapentin 300 mg 3 times daily. She unfortunately did not benefit from increased dose of gabapentin and has since stopped taking it.  She has attempted using both Advil and Tylenol without any benefit.  Patient was seen by PCP on 06/04/2018 for sciatica of left side and recommended physical therapy.  Patient did PT for 4 weeks without benefit.  She has attempted to use both heat and ice along with increasing exercise such as walking and aqua therapy but this again has not provided benefit.  This back pain has been limiting certain activities that patient is able to do along with interfering with her nightly sleep.  She states she does use pillows between her legs but is unable to lay on her left side due to the  pain.  Patient continues to take aspirin 81 mg without side effects of bleeding or bruising.  Continues to take Lipitor without side effects myalgias blood pressure today satisfactory 1 3489.  Patient denies new or worsening stroke/TIA symptoms.    ROS:   14 system review of systems performed and negative with exception of back pain  PMH:  Past Medical History:  Diagnosis Date    . Anemia   . Blood transfusion   . Blood transfusion without reported diagnosis   . CKD (chronic kidney disease) stage 2, GFR 60-89 ml/min   . Depression   . Diabetes mellitus   . Diverticulosis of colon with hemorrhage 2010, 09/2011  . H/O hiatal hernia   . Helicobacter pylori gastritis 09/13/2013  . Hx: UTI (urinary tract infection)   . Hypertension   . IBS (irritable bowel syndrome)   . Personal history of colonic polyps    adenomas since 2010  . Renal cell carcinoma 2004   lt kidney removed  . Stroke [facility]) 12/2017    PSH:  Past Surgical History:  Procedure Laterality Date  . ABDOMINAL HYSTERECTOMY    . ABDOMINAL HYSTERECTOMY    . CHOLECYSTECTOMY    . COLONOSCOPY  12/19/2008   diverticulosis  . COLONOSCOPY  11/17/2011   Procedure: COLONOSCOPY;  Surgeon: Zenovia Jarred, MD;  Location: [facility];  Service: Gastroenterology;  Laterality: N/A;  . DILATION AND CURETTAGE OF UTERUS N/A 07/11/2014   Procedure: Repair of Vaginal Cuff;  Surgeon: Melina Schools, MD;  Location: [facility];  Service: Gynecology;  Laterality: N/A;  . FLEXIBLE SIGMOIDOSCOPY  12/12/2008   diverticulosis  . HAND SURGERY     x5 right  . HYDRADENITIS EXCISION Right 03/21/2014   Procedure: EXCISION HIDRADENITIS AXILLA;  Surgeon: Pedro Earls, MD;  Location: [facility];  Service: General;  Laterality: Right;  . KNEE ARTHROSCOPY     x2 left  . NEPHRECTOMY     left  . TRIGGER FINGER RELEASE Left 06/23/2015   Procedure: RELEASE TRIGGER FINGER/A-1 PULLEY LEFT RING FINGER;  Surgeon: Daryll Brod, MD;  Location: [facility];  Service: Orthopedics;  Laterality: Left;  . TUBAL LIGATION    . UPPER GASTROINTESTINAL ENDOSCOPY  12/12/2008   gastroporesis    Social History:  Social History   Socioeconomic History  . Marital status: Divorced    Spouse name: Not on file  . Number of children: 3  . Years of education: Not on file  . Highest education level: Not on file  Occupational History   . Occupation: Occupational psychologist  Social Needs  . Financial resource strain: Not on file  . Food insecurity:    Worry: Not on file    Inability: Not on file  . Transportation needs:    Medical: Not on file    Non-medical: Not on file  Tobacco Use  . Smoking status: Never Smoker  . Smokeless tobacco: Never Used  Substance and Sexual Activity  . Alcohol use: No    Alcohol/week: 0.0 standard drinks  . Drug use: No  . Sexual activity: Never  Lifestyle  . Physical activity:    Days per week: Not on file    Minutes per session: Not on file  . Stress: Not on file  Relationships  . Social connections:    Talks on phone: Not on file    Gets together: Not on file    Attends religious service: Not on file    Active member of club  or organization: Not on file    Attends meetings of clubs or organizations: Not on file    Relationship status: Not on file  . Intimate partner violence:    Fear of current or ex partner: Not on file    Emotionally abused: Not on file    Physically abused: Not on file    Forced sexual activity: Not on file  Other Topics Concern  . Not on file  Social History Narrative   Regular exercise: walk 2 days, aerobics 3 days[facility]   Caffeine use: coffee every AM    Family History:  Family History  Problem Relation Age of Onset  . Colon polyps Mother   . Diabetes Maternal Grandmother   . Breast cancer Maternal Grandmother   . Diabetes Father   . Breast cancer Daughter   . Colon cancer Neg Hx     Medications:   Current Outpatient Medications on File Prior to Visit  Medication Sig Dispense Refill  . aspirin EC 81 MG tablet Take 1 tablet (81 mg total) by mouth daily.    Marland Kitchen atorvastatin (LIPITOR) 40 MG tablet Take 1 tablet (40 mg total) by mouth daily at 6 PM. 30 tablet 1  . BYDUREON 2 MG PEN 2 mg. weekly    . hydrochlorothiazide (HYDRODIURIL) 25 MG tablet Take 1 tablet (25 mg total) by mouth daily. 30 tablet 1  . liraglutide (VICTOZA) 18  MG/3ML SOPN Victoza 3-Pak 0.6 mg/0.1 mL (18 mg/3 mL) subcutaneous pen injector  INJECT 1.2 MG INTO THE SKIN DAILY.    Marland Kitchen lisinopril (PRINIVIL,ZESTRIL) 40 MG tablet Take 1 tablet (40 mg total) by mouth daily. 30 tablet 0  . pantoprazole (PROTONIX) 40 MG tablet Take 1 tablet (40 mg total) by mouth daily. 15 tablet 0  . silver sulfADIAZINE (SILVADENE) 1 % cream silver sulfadiazine 1 % topical cream  APPLY TO AFFECTED AREA DAILY     No current facility-administered medications on file prior to visit.     Allergies:  No Known Allergies   Physical Exam  Vitals:   07/05/18 1408  BP: 134/89  Pulse: 92  Weight: 222 lb 9.6 oz (101 kg)  Height: 5\' 1"  (1.549 m)   Body mass index is 42.06 kg/m. No exam data present  General: well developed, pleasant middle aged [place] American female, well nourished, seated, flat affect Head: head normocephalic and atraumatic.   Neck: supple with no carotid or supraclavicular bruits Cardiovascular: regular rate and rhythm, no murmurs Musculoskeletal: no deformity Skin:  no rash/petichiae, well-appearing healing wound from previous burn Vascular:  Normal pulses all extremities  Neurologic Exam Mental Status: Awake and fully alert. Oriented to place and time. Recent and remote memory intact. Attention span, concentration and fund of knowledge appropriate. Mood and affect appropriate.  Cranial Nerves: Fundoscopic exam reveals sharp disc margins. Pupils equal, briskly reactive to light. Extraocular movements full without nystagmus. Visual fields full to confrontation. Hearing intact. Facial sensation intact. Face, tongue, palate moves normally and symmetrically.  Motor: Normal bulk and tone. Normal strength in all tested extremity muscles. Sensory.: intact to touch , pinprick , position and vibratory sensation.  Coordination: Rapid alternating movements normal in all extremities. Finger-to-nose and heel-to-shin performed accurately bilaterally. Gait and  Station: Arises from chair without difficulty. Stance is normal. Gait demonstrates hobbling walk.  Tandem gait not tested due to back pain. Reflexes: 1+ and symmetric. Toes downgoing.     Diagnostic Data (Labs, Imaging, Testing)  Ct Angio Head  W Or Wo Contrast Result Date: 01/04/2018 IMPRESSION: Right lateral basal ganglia hemorrhage is stable. No underlying vascular malformation or tumor.   Ct Head Wo Contrast Result Date: 01/05/2018 IMPRESSION: No change in a right posterior basal ganglia/radiating white matter track intraparenchymal hematoma, measuring 3.3 x 2.3 x 1.5 cm with mild surrounding edema and mild mass effect with right-to-left shift of 2 mm.  Ct Head Wo Contrast Result Date: 01/04/2018 IMPRESSION: 1.Focal area of hemorrhage, likely an acute hemorrhagic infarct, arising in the posterior right lentiform nucleus. 2. Atrophy with patchy supratentorial small vessel disease. No acute infarct beyond the presumed hemorrhagic infarct in the right lentiform nucleus. 3. No hemorrhagic focus elsewhere. No extra-axial fluid collection or midline shift. 4. Foci of arterial vascular calcification noted. 5. Mucosal thickening in several ethmoid air cells.   Dg Chest Port 1 View Result Date: 01/05/2018 IMPRESSION: No active disease.  Echocardiogram: Study Conclusions - Left ventricle: The cavity size was normal. There was mild concentric hypertrophy. Systolic function was normal. The estimated ejection fraction was in the range of 60% to 65%. Wall motion was normal; there were no regional wall motion abnormalities. Doppler parameters are consistent with abnormal left ventricular relaxation (grade 1 diastolic dysfunction). - Aortic valve: There was trivial regurgitation. - Left atrium: The atrium was mildly dilated.  B/L Carotid U/S:1-39% ICA plaquing. Vertebral artery flow is antegrade     ASSESSMENT: Leanda Padmore is a 62 y.o. year old female here with [place] on 01/04/18 secondary to hypertension. Vascular risk factors include HTN, HLD and DM.  Patient returns today for scheduled follow-up visit and overall doing well from a stroke standpoint but does have continued complaints of possible sciatica left side.   PLAN: -Continue aspirin 81 mg and Lipitor  for secondary stroke prevention -As patient has attempted conservative therapy of continued sciatic pain for approximately 4 months including medication management of over-the-counter meds, narcotics prescribed by PCP and gabapentin along with physical therapy and continued exercise, recommend obtaining imaging.  Recommended MRI of lumbar spine but patient states she is unable to tolerate MRI.  Order placed for CT lumbar spine -obtain EMG/nerve conduction due to continued back pain  -Patient declined trial of Cymbalta for continued sciatic pain due to history of depression -consider referral to ortho if needed -Recommended continued use of heat/ice along with exercising as tolerated and Tylenol for pain management -F/u with PCP regarding your HLD, DM, and HTN management -continue to monitor BP at home -Maintain strict control of hypertension with blood pressure goal below 130/90, diabetes with hemoglobin A1c goal below 6.5% and cholesterol with LDL cholesterol (bad cholesterol) goal below 70 mg/dL. I also advised the patient to eat a healthy diet with plenty of whole grains, cereals, fruits and vegetables, exercise regularly and maintain ideal body weight.  Follow up in 6 months or call earlier if needed  Greater than 50% time during this 25 minute consultation visit was spent on counseling and coordination of care about HLD, DM and HTN, discussion about risk benefit of anticoagulation and answering questions.    Venancio Poisson, AGNP-BC  [facility] [address] [place], [place] [address]  Phone [phone]  Fax [phone]

## 2018-07-09 ENCOUNTER — Telehealth: Payer: Self-pay | Admitting: Adult Health

## 2018-07-09 NOTE — Telephone Encounter (Signed)
[employer] Medicare order sent to GI. No auth they will reach out to the pt to schedule.  °

## 2018-07-12 ENCOUNTER — Ambulatory Visit
Admission: RE | Admit: 2018-07-12 | Discharge: 2018-07-12 | Disposition: A | Discharge status: Home / Self Care | Payer: Medicare Other | Source: Ambulatory Visit | Attending: Adult Health | Admitting: Adult Health

## 2018-07-13 ENCOUNTER — Telehealth: Payer: Self-pay | Admitting: Adult Health

## 2018-07-13 DIAGNOSIS — M5410 Radiculopathy, site unspecified: Secondary | ICD-10-CM

## 2018-07-13 NOTE — Progress Notes (Signed)
I agree with the above plan 

## 2018-07-13 NOTE — Telephone Encounter (Signed)
Please advise patient that her CT spine showed Severe lower lumbar facet arthrosis meaning some cartilage around the spine is deteriorating. The scan also showed minimal disc bulging without narrowing. A referral has been placed to orthopedics for further evaluation and management. She will be called to schedule appointment.

## 2018-07-13 NOTE — Telephone Encounter (Signed)
RN gave patient the results of the CT spine. Rn stated per Janett Billow NP it showed lower lumbar facet arthrosis meaning some cartilage around the spine is deteriorating. It also showed minimal disc bulging without narrowing. RN stated a referral was placed for orthopedics for further evaluation, and management. She verbalized understanding that she will get a call. Rn stated the office is closed on Sunday,and to give it 3 to 4 days. Rn recommend patient call next week if she does not hear from orthopedics.Marland Kitchen

## 2018-08-09 ENCOUNTER — Ambulatory Visit: Payer: Medicare Other

## 2018-08-09 ENCOUNTER — Encounter: Payer: Medicare Other | Admitting: Diagnostic Neuroimaging

## 2018-08-13 ENCOUNTER — Ambulatory Visit (INDEPENDENT_AMBULATORY_CARE_PROVIDER_SITE_OTHER): Payer: Medicare Other | Admitting: Orthopaedic Surgery

## 2018-08-13 ENCOUNTER — Encounter (INDEPENDENT_AMBULATORY_CARE_PROVIDER_SITE_OTHER): Payer: Self-pay | Admitting: Orthopaedic Surgery

## 2018-08-13 VITALS — BP 178/97 | HR 79 | Ht 61.0 in | Wt 221.0 lb

## 2018-08-13 DIAGNOSIS — G8929 Other chronic pain: Secondary | ICD-10-CM

## 2018-08-13 DIAGNOSIS — M5442 Lumbago with sciatica, left side: Secondary | ICD-10-CM

## 2018-08-13 NOTE — Progress Notes (Signed)
Office Visit Note   Patient: [name]           Date of Birth: [birth year]           MRN: [id number] Visit Date: 08/13/2018              Requested by: Venancio Poisson, NP [phone] [address] [place], [place] [address] PCP: Mindi Curling, PA-C   Assessment & Plan: Visit Diagnoses:  1. Chronic left-sided low back pain with left-sided sciatica     Plan: Recent CT scan demonstrates multiple levels of degenerative arthrosis lumbar spine.  No evidence of nerve root irritation or acute change.  Will ask Dr. Ernestina Patches to evaluate for consideration of facet blocks on the left at either or L4-5 and L5-S1 versus epidural steroid injection. Long discussion with [name] over 45 minutes 50% of the time in counseling regarding her diagnosis, treatment to date and options over time.  I think it is worth having Dr. Ernestina Patches evaluate her for consideration of facet blocks or epidural steroid injection as both a diagnostic and therapeutic modality.  Nothing on the CT scan demonstrates nerve root irritation that would account for her radicular discomfort.  Some of this might be related to her diabetes.  She is claustrophobic and relates cannot have an MRI scan, thus the reason for the CT  Follow-Up Instructions: Return in about 1 month (around 09/12/2018).   Orders:  Orders Placed This Encounter  Procedures  . Ambulatory referral to Physical Medicine Rehab   No orders of the defined types were placed in this encounter.     Procedures: No procedures performed   Clinical Data: No additional findings.   Subjective: Chief Complaint  Patient presents with  . New Patient (Initial Visit)    PT FELL 12/2017 AND WAS IN ICU, WAS TOLD HAD CEREBRUM HEMMORAGE, FELL ON KNEES. NOW HAVING BURNING PAIN DOWN LEFT SIDE OF LEG FOM HIP TO TOES  [name] is 62 years old visited the office for evaluation of low back pain associated with what she describes as left lower extremity radiculopathy.   [name] had a cerebral bleed in February that she was told was related to "stress".  The issue resolved without any residual effect.  At that initially she was weak.  She never had any symptoms of a "stroke".  She fell after having the event in February and relates that she is had trouble with her back ever since that time.  She is been experiencing back pain associated with left lower extremity radiculopathy as far distally as her left foot.  She is having some burning on the plantar aspect of her foot. Does have history of diabetes with obesity.To Date she has tried physical therapy, aquatic therapy, gabapentin, tramadol and over-the-counter medicine without much relief.  Has had trouble sleeping trying to find a comfortable position  HPI  Review of Systems  Constitutional: Negative for fatigue and fever.  HENT: Negative for ear pain.   Eyes: Negative for pain.  Respiratory: Negative for cough and shortness of breath.   Cardiovascular: Negative for leg swelling.  Gastrointestinal: Negative for constipation and diarrhea.  Genitourinary: Negative for difficulty urinating.  Musculoskeletal: Positive for back pain. Negative for neck pain.  Skin: Negative for rash.  Allergic/Immunologic: Negative for food allergies.  Neurological: Positive for weakness and numbness.  Hematological: Does not bruise/bleed easily.  Psychiatric/Behavioral: Positive for sleep disturbance.     Objective: Vital Signs: BP (!) 178/97 (BP Location: Right Arm, Patient  Position: Sitting, Cuff Size: Normal)   Pulse 79   Ht 5\' 1"  (1.549 m)   Wt 221 lb (100.2 kg)   LMP 11/16/2002   BMI 41.76 kg/m   Physical Exam  Constitutional: She is oriented to person, place, and time. She appears well-developed and well-nourished.  HENT:  Mouth/Throat: Oropharynx is clear and moist.  Eyes: Pupils are equal, round, and reactive to light. EOM are normal.  Pulmonary/Chest: Effort normal.  Neurological: She is alert and  oriented to person, place, and time.  Skin: Skin is warm and dry.  Psychiatric: She has a normal mood and affect. Her behavior is normal.    Ortho Exam awake alert and oriented x3.  Comfortable sitting.  Overweight with a BMI of 42.  Straight leg raise negative.  Some percussible tenderness to left side of the lumbar spine at the lumbosacral junction.  Painless range of motion both hips and both knees.  Neurologically intact.  Some decreased sensibility in the plantar aspect of the left foot.  Good capillary refill to toes.  Motor exam intact with good strength.  Specialty Comments:  No specialty comments available.  Imaging: No results found.   PMFS History: Patient Active Problem List   Diagnosis Date Noted  . Hyperlipidemia   . ICH (intracerebral hemorrhage) ([place] [facility]) 01/04/2018  . Sepsis ([facility]) 08/06/2016  . Diverticulitis 08/06/2016  . CKD (chronic kidney disease) stage 2, GFR 60-89 ml/min   . Diverticulitis of intestine without perforation or abscess without bleeding   . Neuralgia of chest 12/30/2015  . Atypical chest pain 12/01/2015  . Physical exam 10/26/2015  . 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 Maxillary sinusitis, acute 01/21/2015  . Strain of left pectoralis muscle 09/26/2014  . Benign paroxysmal positional vertigo 10/03/2013  . Nausea with vomiting 10/03/2013  . Bleeding nose 10/03/2013  . Helicobacter pylori gastritis 09/13/2013  . Pyuria 08/20/2013  . Abnormal abdominal CT scan 08/20/2013  . Abdominal pain 08/16/2013  . Back pain 07/31/2013  . LUQ pain 07/31/2013  . Laceration of hand 07/10/2013  . Hidradenitis suppurativa of right axilla 05/31/2013  . Renal cell carcinoma   . IBS (irritable bowel syndrome)   . S/p nephrectomy   . 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 OBESITY, MORBID 09/16/2009  . Essential  hypertension 06/23/2009  . Personal history of colonoc adenomas 12/12/2008   Past Medical History:  Diagnosis Date  . Anemia   . Blood transfusion   . Blood transfusion without reported diagnosis   . CKD (chronic kidney disease) stage 2, GFR 60-89 ml/min   . Depression   . Diabetes mellitus   . Diverticulosis of colon with hemorrhage 2010, 09/2011  . H/O hiatal hernia   . Helicobacter pylori gastritis 09/13/2013  . Hx: UTI (urinary tract infection)   . Hypertension   . IBS (irritable bowel syndrome)   . Personal history of colonic polyps    adenomas since 2010  . Renal cell carcinoma 2004   lt kidney removed  . Stroke [facility]) 12/2017    Family History  Problem Relation Age of Onset  . Colon polyps Mother   . Diabetes Maternal Grandmother   . Breast cancer Maternal Grandmother   . Diabetes Father   . Breast cancer Daughter   . Colon cancer Neg Hx     Past Surgical History:  Procedure Laterality Date  .  ABDOMINAL HYSTERECTOMY    . ABDOMINAL HYSTERECTOMY    . CHOLECYSTECTOMY    . COLONOSCOPY  12/19/2008   diverticulosis  . COLONOSCOPY  11/17/2011   Procedure: COLONOSCOPY;  Surgeon: Zenovia Jarred, MD;  [facility];  Service: Gastroenterology;  Laterality: N/A;  . DILATION AND CURETTAGE OF UTERUS N/A 07/11/2014   Procedure: Repair of Vaginal Cuff;  Surgeon: Melina Schools, MD;  Location: [facility];  Service: Gynecology;  Laterality: N/A;  . FLEXIBLE SIGMOIDOSCOPY  12/12/2008   diverticulosis  . HAND SURGERY     x5 right  . HYDRADENITIS EXCISION Right 03/21/2014   Procedure: EXCISION HIDRADENITIS AXILLA;  Surgeon: Pedro Earls, MD;  Location: [facility];  Service: General;  Laterality: Right;  . KNEE ARTHROSCOPY     x2 left  . NEPHRECTOMY     left  . TRIGGER FINGER RELEASE Left 06/23/2015   Procedure: RELEASE TRIGGER FINGER/A-1 PULLEY LEFT RING FINGER;  Surgeon: Daryll Brod, MD;  Location: [facility];  Service: Orthopedics;  Laterality: Left;   . TUBAL LIGATION    . UPPER GASTROINTESTINAL ENDOSCOPY  12/12/2008   gastroporesis   Social History   Occupational History  . Occupation: Occupational psychologist  Tobacco Use  . Smoking status: Never Smoker  . Smokeless tobacco: Never Used  Substance and Sexual Activity  . Alcohol use: No    Alcohol/week: 0.0 standard drinks  . Drug use: No  . Sexual activity: Never

## 2018-08-14 ENCOUNTER — Telehealth (INDEPENDENT_AMBULATORY_CARE_PROVIDER_SITE_OTHER): Payer: Self-pay | Admitting: *Deleted

## 2018-08-16 ENCOUNTER — Other Ambulatory Visit (INDEPENDENT_AMBULATORY_CARE_PROVIDER_SITE_OTHER): Payer: Self-pay | Admitting: Physical Medicine and Rehabilitation

## 2018-08-16 MED ORDER — TRIAZOLAM 0.25 MG PO TABS: ORAL_TABLET | ORAL | 0 refills | Status: DC

## 2018-08-16 NOTE — Progress Notes (Signed)
Pre-procedure triazolam ordered for pre-operative anxiety.  

## 2018-08-31 ENCOUNTER — Telehealth (INDEPENDENT_AMBULATORY_CARE_PROVIDER_SITE_OTHER): Payer: Self-pay | Admitting: Physical Medicine and Rehabilitation

## 2018-08-31 NOTE — Telephone Encounter (Signed)
Patient called to state that she has injection on 10/21 and was told she could get something for anxiety.  Please call patient to advise. [phone]

## 2018-09-03 ENCOUNTER — Other Ambulatory Visit (INDEPENDENT_AMBULATORY_CARE_PROVIDER_SITE_OTHER): Payer: Self-pay | Admitting: Physical Medicine and Rehabilitation

## 2018-09-03 ENCOUNTER — Ambulatory Visit (INDEPENDENT_AMBULATORY_CARE_PROVIDER_SITE_OTHER): Payer: Medicare Other | Admitting: Physical Medicine and Rehabilitation

## 2018-09-03 ENCOUNTER — Ambulatory Visit (INDEPENDENT_AMBULATORY_CARE_PROVIDER_SITE_OTHER): Payer: Self-pay

## 2018-09-03 ENCOUNTER — Encounter (INDEPENDENT_AMBULATORY_CARE_PROVIDER_SITE_OTHER): Payer: Self-pay | Admitting: Physical Medicine and Rehabilitation

## 2018-09-03 VITALS — BP 150/83 | HR 77 | Temp 98.3°F

## 2018-09-03 DIAGNOSIS — M47816 Spondylosis without myelopathy or radiculopathy, lumbar region: Secondary | ICD-10-CM

## 2018-09-03 MED ORDER — METHYLPREDNISOLONE ACETATE 80 MG/ML IJ SUSP
80.0000 mg | Freq: Once | INTRAMUSCULAR | Status: AC
  Administered 2018-09-03: 80 mg

## 2018-09-03 MED ORDER — DIAZEPAM 5 MG PO TABS: ORAL_TABLET | ORAL | 0 refills | Status: DC

## 2018-09-03 NOTE — Patient Instructions (Signed)

## 2018-09-03 NOTE — Telephone Encounter (Signed)
Triazolam was sent in on 10/3, please let her know, but I did send in diazepam for today.

## 2018-09-03 NOTE — Telephone Encounter (Signed)
Can you call and let her know

## 2018-09-03 NOTE — Progress Notes (Signed)
Numeric Pain Rating Scale and Functional Assessment Average Pain 10+   In the last MONTH (on 0-10 scale) has pain interfered with the following?  1. General activity like being  able to carry out your everyday physical activities such as walking, climbing stairs, carrying groceries, or moving a chair?  Rating(6) able to do activities but pain is constant, tolerates pain.   +Driver, -BT, -Dye Allergies.

## 2018-09-03 NOTE — Telephone Encounter (Signed)
Please advise. Patient is scheduled for 1030 today.

## 2018-09-03 NOTE — Progress Notes (Signed)
Pre-procedure diazepam ordered for pre-operative anxiety. Triazolam was sent in on 10/3

## 2018-09-12 NOTE — Progress Notes (Signed)
[name] - 62 y.o. female MRN [id number]  Date of birth: 1955-12-09  Office Visit Note: Visit Date: 09/03/2018 PCP: Mindi Curling, PA-C Referred by: Mindi Curling, PA-C  Subjective: Chief Complaint  Patient presents with  . Lower Back - Pain   HPI:  [name] is a 62 y.o. female who comes in today For planned diagnostic facet joint/medial branch block of the left L4-5 and L5-S1 facet joints.  She was initially seen recently by Dr. Joni Fears with complaints of left-sided low back pain low back pain much worse than any referral pain.  She was having some history of referral pain in the leg at times.  Now pain is only lateral to the hip.  No paresthesias.  She unfortunately has multiple medical issues with history of uncontrolled diabetes type 2.  She has had complications with that.  She has had intracranial hemorrhage.  She has not had prior lumbar surgery.  Dr. Durward Fortes felt likely facet arthropathy on the CT scan was severe enough to at least inject this area diagnostically to see if how much pain she was having from this area.  If she were to get a great deal of relief we could look at a second block and potential radiofrequency ablation.  If she is able to have an MRI, MRI would be the suggestion if the injection just did not seem to help.  Focused physical therapy and weight loss are also considerations.  She will continue to follow-up with Dr. Durward Fortes at this point.  ROS Otherwise per HPI.  Assessment & Plan: Visit Diagnoses:  1. Spondylosis without myelopathy or radiculopathy, lumbar region     Plan: No additional findings.   Meds & Orders:  Meds ordered this encounter  Medications  . methylPREDNISolone acetate (DEPO-MEDROL) injection 80 mg    Orders Placed This Encounter  Procedures  . Facet Injection  . XR C-ARM NO REPORT    Follow-up: Return if symptoms worsen or fail to improve.   Procedures: No procedures performed  Lumbar Facet Joint  Intra-Articular Injection(s) with Fluoroscopic Guidance  Patient: [name]      Date of Birth: [birth year] MRN: [id number] PCP: Mindi Curling, PA-C      Visit Date: 09/03/2018   Universal Protocol:    Date/Time: 09/03/2018  Consent Given By: the patient  Position: PRONE   Additional Comments: Vital signs were monitored before and after the procedure. Patient was prepped and draped in the usual sterile fashion. The correct patient, procedure, and site was verified.   Injection Procedure Details:  Procedure Site One Meds Administered:  Meds ordered this encounter  Medications  . methylPREDNISolone acetate (DEPO-MEDROL) injection 80 mg     Laterality: Left  Location/Site:  L4-L5 L5-S1  Needle size: 22 guage  Needle type: Spinal  Needle Placement: Articular  Findings:  -Comments: Excellent flow of contrast producing a partial arthrogram.  Procedure Details: The fluoroscope beam is vertically oriented in AP, and the inferior recess is visualized beneath the lower pole of the inferior apophyseal process, which represents the target point for needle insertion. When direct visualization is difficult the target point is located at the medial projection of the vertebral pedicle. The region overlying each aforementioned target is locally anesthetized with a 1 to 2 ml. volume of 1% Lidocaine without Epinephrine.   The spinal needle was inserted into each of the above mentioned facet joints using biplanar fluoroscopic guidance. A 0.25 to 0.5 ml. volume of Isovue-250 was injected and  a partial facet joint arthrogram was obtained. A single spot film was obtained of the resulting arthrogram.    One to 1.25 ml of the steroid/anesthetic solution was then injected into each of the facet joints noted above.   Additional Comments:  The patient tolerated the procedure well Dressing: Band-Aid    Post-procedure details: Patient was observed during the  procedure. Post-procedure instructions were reviewed.  Patient left the clinic in stable condition.    Clinical History: CT LUMBAR SPINE WITHOUT CONTRAST  TECHNIQUE: Multidetector CT imaging of the lumbar spine was performed without intravenous contrast administration. Multiplanar CT image reconstructions were also generated.  COMPARISON:  CT abdomen and pelvis 12/29/2017  FINDINGS: Segmentation: 5 lumbar type vertebrae.  Alignment: Minimal left convex curvature of the lumbar spine. No listhesis.  Vertebrae: No fracture or suspicious osseous lesion.  Paraspinal and other soft tissues: Prior left nephrectomy.  Disc levels:  T11-12: Mild disc space narrowing. Mild disc bulging and mild right and moderate left facet arthrosis without significant stenosis.  T12-L1: Minimal left facet arthrosis without stenosis.  L1-2: Minimal facet arthrosis without stenosis.  L2-3: Minimal disc bulging without stenosis.  L3-4: Minimal disc bulging and moderate right and mild left facet arthrosis without stenosis.  L4-5: Minimal disc bulging and severe bilateral facet arthrosis without stenosis.  L5-S1: Minimal disc bulging and severe right and moderate left facet arthrosis without stenosis.  IMPRESSION: Severe lower lumbar facet arthrosis and minimal disc bulging without stenosis.   Electronically Signed   By: Logan Bores M.D.   On: 07/12/2018 15:53     Objective:  VS:  HT:    WT:   BMI:     BP:(!) 150/83  HR:77bpm  TEMP:98.3 F (36.8 C)( )  RESP:100 % Physical Exam  Ortho Exam Imaging: No results found.

## 2018-09-12 NOTE — Procedures (Signed)
Lumbar Facet Joint Intra-Articular Injection(s) with Fluoroscopic Guidance  Patient: [name]      Date of Birth: 1956-03-01 MRN: [id number] PCP: Mindi Curling, PA-C      Visit Date: 09/03/2018   Universal Protocol:    Date/Time: 09/03/2018  Consent Given By: the patient  Position: PRONE   Additional Comments: Vital signs were monitored before and after the procedure. Patient was prepped and draped in the usual sterile fashion. The correct patient, procedure, and site was verified.   Injection Procedure Details:  Procedure Site One Meds Administered:  Meds ordered this encounter  Medications  . methylPREDNISolone acetate (DEPO-MEDROL) injection 80 mg     Laterality: Left  Location/Site:  L4-L5 L5-S1  Needle size: 22 guage  Needle type: Spinal  Needle Placement: Articular  Findings:  -Comments: Excellent flow of contrast producing a partial arthrogram.  Procedure Details: The fluoroscope beam is vertically oriented in AP, and the inferior recess is visualized beneath the lower pole of the inferior apophyseal process, which represents the target point for needle insertion. When direct visualization is difficult the target point is located at the medial projection of the vertebral pedicle. The region overlying each aforementioned target is locally anesthetized with a 1 to 2 ml. volume of 1% Lidocaine without Epinephrine.   The spinal needle was inserted into each of the above mentioned facet joints using biplanar fluoroscopic guidance. A 0.25 to 0.5 ml. volume of Isovue-250 was injected and a partial facet joint arthrogram was obtained. A single spot film was obtained of the resulting arthrogram.    One to 1.25 ml of the steroid/anesthetic solution was then injected into each of the facet joints noted above.   Additional Comments:  The patient tolerated the procedure well Dressing: Band-Aid    Post-procedure details: Patient was observed during the  procedure. Post-procedure instructions were reviewed.  Patient left the clinic in stable condition.

## 2018-09-14 ENCOUNTER — Other Ambulatory Visit: Payer: Self-pay | Admitting: Family Medicine

## 2018-09-14 DIAGNOSIS — Z1231 Encounter for screening mammogram for malignant neoplasm of breast: Secondary | ICD-10-CM

## 2019-01-07 ENCOUNTER — Encounter: Payer: Self-pay | Admitting: Adult Health

## 2019-01-07 ENCOUNTER — Ambulatory Visit: Payer: Medicare Other | Admitting: Adult Health

## 2019-01-07 VITALS — BP 184/93 | HR 72 | Wt 231.7 lb

## 2019-01-07 DIAGNOSIS — M5410 Radiculopathy, site unspecified: Secondary | ICD-10-CM | POA: Diagnosis not present

## 2019-01-07 DIAGNOSIS — E119 Type 2 diabetes mellitus without complications: Secondary | ICD-10-CM | POA: Diagnosis not present

## 2019-01-07 DIAGNOSIS — I610 Nontraumatic intracerebral hemorrhage in hemisphere, subcortical: Secondary | ICD-10-CM | POA: Diagnosis not present

## 2019-01-07 DIAGNOSIS — I10 Essential (primary) hypertension: Secondary | ICD-10-CM

## 2019-01-07 DIAGNOSIS — E785 Hyperlipidemia, unspecified: Secondary | ICD-10-CM

## 2019-01-07 DIAGNOSIS — Z794 Long term (current) use of insulin: Secondary | ICD-10-CM

## 2019-01-07 NOTE — Patient Instructions (Signed)
Continue aspirin 81 mg daily  and lipitor  for secondary stroke prevention  Continue to follow up with PCP regarding cholesterol, blood pressure and diabetes management   We will check EMG/NCV to assess for further causes of ongoing left leg pain  Highly encourage obtaining BP monitor as there is a chance BP can be stable at home but elevates at appointments therefore be continue your normal home levels  Maintain strict control of hypertension with blood pressure goal below 130/90, diabetes with hemoglobin A1c goal below 6.5% and cholesterol with LDL cholesterol (bad cholesterol) goal below 70 mg/dL. I also advised the patient to eat a healthy diet with plenty of whole grains, cereals, fruits and vegetables, exercise regularly and maintain ideal body weight.         Thank you for coming to see [place] at [facility]. I hope we have been able to provide you high quality care today.  You may receive a patient satisfaction survey over the next few weeks. We would appreciate your feedback and comments so that we may continue to improve ourselves and the health of our patients.

## 2019-01-07 NOTE — Progress Notes (Signed)
[facility] [address] [place]. [place] [address] [phone]       OFFICE FOLLOW UP NOTE  [name] Date of Birth:  1956/01/02 Medical Record Number:  [id number]   Reason for Referral:  hospital stroke follow up  CHIEF COMPLAINT:  No chief complaint on file.   HPI: 01/08/19 VISIT [name] is a 63 year old female who is being seen today for 79-month follow-up visit regarding [place] in 12/2017.  She has been stable from a stroke standpoint without residual deficits or new symptoms.  She continues on aspirin 81 mg without side effects of bleeding or bruising.  Continues on atorvastatin without side effects of myalgias.  Blood pressure today elevated at 184/93, asymptomatic.  She does endorse increased stress and is actively being treated by her PCP for depression/anxiety.  Her overall sleep is fragmented as she is raising her 2 grandchildren as her daughter passed away 2 years prior.  She does endorse daytime fatigue and can nap easily.  She is not aware if she snores.  She does not monitor BP at home but does plan on obtaining cuff in order to start monitoring.  Continues to monitor glucose levels at home which have been stable.  Her overall sleep At prior visit, discussion regarding radiating nerve pain and as conservative measures did not provide benefit, CT lumbar spine obtained as she is unable to tolerate MRIs.  CT lumbar spine on 07/12/2018 showed severe lower lumbar facet arthrosis and minimal disc bulging without stenosis.  She was referred to Ortho for potential further treatment recommendations.  She did receive lumbar facet joint injection on 09/03/2018 by Dr. Ernestina Patches.  He does not appear as though she has followed up with Ortho since this time.  She does endorse improvement of pain around her lumbar region but does continue to experience radiating numbness/tingling down her left leg.  No further concerns at this time.   HISTORY 07/05/2018: Patient  returns today for follow-up visit.  CT head was obtained and as it showed resolution of previous ICH recommended to start aspirin 81 mg for secondary stroke prevention.  Was also recommended to start gabapentin 300 mg at previous visit for possible neuropathy or sciatica. patient called on 02/26/2018 stating that she was tolerating well but continued to have pain.  Recommended increasing dose of gabapentin 300 mg 3 times daily. She unfortunately did not benefit from increased dose of gabapentin and has since stopped taking it.  She has attempted using both Advil and Tylenol without any benefit.  Patient was seen by PCP on 06/04/2018 for sciatica of left side and recommended physical therapy.  Patient did PT for 4 weeks without benefit.  She has attempted to use both heat and ice along with increasing exercise such as walking and aqua therapy but this again has not provided benefit.  This back pain has been limiting certain activities that patient is able to do along with interfering with her nightly sleep.  She states she does use pillows between her legs but is unable to lay on her left side due to the pain.  Patient continues to take aspirin 81 mg without side effects of bleeding or bruising.  Continues to take Lipitor without side effects myalgias blood pressure today satisfactory 1 3489.  Patient denies new or worsening stroke/TIA symptoms.  INITIAL VISIT 02/13/18: [name] is being seen today in the office for acute posterior right lentiform nucleus hemorrhage on 01/04/18. History obtained from patient and chart review. Reviewed all  radiology images and labs personally.  [name] a 63 y.o.femalepast history of CKD stage II, hypertension, diabetes, depression, who was last known normal last night on 01/03/18, was brought in by EMS when she woke up this morning on 01/04/18 and had a fall. She said that she fell multiple times this morning and felt weak on one side of the body on the  left. She denies any headaches. She denies any visual changes. She had recent pain from previous umbilical hernia which increased her blood pressure but does states she was compliant on hypertension medications. No history of strokes in the past. CT head reviewed and showed focal area of hemorrhage, likely an acute hemorrhagic infarct. CTA head showed right lateral basal ganglia hemorrhage which was stable. 2D echo showed an EF of 60-65%. B/L carotid U/S showed bilateral 1-39% ICA plaquing. LDL 118 and A1c 7.0.  No antithrombotic after ICH. Start on lipitor 40mg . Patient d/c'd home in stable conditions.    Since discharge patient has been doing well.  She continues to take Lipitor without increase in myalgias.  Blood pressure today satisfactory 135/83.  Does monitor this when she goes to [facility] or [facility] and is considering purchasing an at home blood pressure cuff.  She does complain of left leg numbness with a burning sensation that goes down from her hip/lower back and radiates down into her foot.  She states that she does have a history of neuropathy and that this started approximately 2 weeks ago.  She states that she was compliant with blood pressure medications prior to her [place] but she was having increased pain from umbilical hernia and this made her blood pressure increase.  No new or worsening stroke/TIA symptoms.      ROS:   14 system review of systems performed and negative with exception of see HPI  PMH:  Past Medical History:  Diagnosis Date  . Anemia   . Blood transfusion   . Blood transfusion without reported diagnosis   . CKD (chronic kidney disease) stage 2, GFR 60-89 ml/min   . Depression   . Diabetes mellitus   . Diverticulosis of colon with hemorrhage 2010, 09/2011  . H/O hiatal hernia   . Helicobacter pylori gastritis 09/13/2013  . Hx: UTI (urinary tract infection)   . Hypertension   . IBS (irritable bowel syndrome)   . Personal history of colonic polyps    adenomas  since 2010  . Renal cell carcinoma 2004   lt kidney removed  . Stroke [facility]) 12/2017    PSH:  Past Surgical History:  Procedure Laterality Date  . ABDOMINAL HYSTERECTOMY    . ABDOMINAL HYSTERECTOMY    . CHOLECYSTECTOMY    . COLONOSCOPY  12/19/2008   diverticulosis  . COLONOSCOPY  11/17/2011   Procedure: COLONOSCOPY;  Surgeon: Zenovia Jarred, MD;  Location: [facility];  Service: Gastroenterology;  Laterality: N/A;  . DILATION AND CURETTAGE OF UTERUS N/A 07/11/2014   Procedure: Repair of Vaginal Cuff;  Surgeon: Melina Schools, MD;  Location: [facility];  Service: Gynecology;  Laterality: N/A;  . FLEXIBLE SIGMOIDOSCOPY  12/12/2008   diverticulosis  . HAND SURGERY     x5 right  . HYDRADENITIS EXCISION Right 03/21/2014   Procedure: EXCISION HIDRADENITIS AXILLA;  Surgeon: Pedro Earls, MD;  Location: [facility];  Service: General;  Laterality: Right;  . KNEE ARTHROSCOPY     x2 left  . NEPHRECTOMY     left  . TRIGGER FINGER RELEASE Left 06/23/2015  Procedure: RELEASE TRIGGER FINGER/A-1 PULLEY LEFT RING FINGER;  Surgeon: Daryll Brod, MD;  Location: [facility];  Service: Orthopedics;  Laterality: Left;  . TUBAL LIGATION    . UPPER GASTROINTESTINAL ENDOSCOPY  12/12/2008   gastroporesis    Social History:  Social History   Socioeconomic History  . Marital status: Divorced    Spouse name: Not on file  . Number of children: 3  . Years of education: Not on file  . Highest education level: Not on file  Occupational History  . Occupation: Occupational psychologist  Social Needs  . Financial resource strain: Not on file  . Food insecurity:    Worry: Not on file    Inability: Not on file  . Transportation needs:    Medical: Not on file    Non-medical: Not on file  Tobacco Use  . Smoking status: Never Smoker  . Smokeless tobacco: Never Used  Substance and Sexual Activity  . Alcohol use: No    Alcohol/week: 0.0 standard drinks  . Drug use: No  . Sexual activity: Never   Lifestyle  . Physical activity:    Days per week: Not on file    Minutes per session: Not on file  . Stress: Not on file  Relationships  . Social connections:    Talks on phone: Not on file    Gets together: Not on file    Attends religious service: Not on file    Active member of club or organization: Not on file    Attends meetings of clubs or organizations: Not on file    Relationship status: Not on file  . Intimate partner violence:    Fear of current or ex partner: Not on file    Emotionally abused: Not on file    Physically abused: Not on file    Forced sexual activity: Not on file  Other Topics Concern  . Not on file  Social History Narrative   Regular exercise: walk 2 days, aerobics 3 days[facility]   Caffeine use: coffee every AM    Family History:  Family History  Problem Relation Age of Onset  . Colon polyps Mother   . Diabetes Maternal Grandmother   . Breast cancer Maternal Grandmother   . Diabetes Father   . Breast cancer Daughter   . Colon cancer Neg Hx     Medications:   Current Outpatient Medications on File Prior to Visit  Medication Sig Dispense Refill  . aspirin EC 81 MG tablet Take 1 tablet (81 mg total) by mouth daily.    Marland Kitchen atorvastatin (LIPITOR) 40 MG tablet Take 1 tablet (40 mg total) by mouth daily at 6 PM. 30 tablet 1  . BYDUREON 2 MG PEN 2 mg. weekly    . diazepam (VALIUM) 5 MG tablet Take 1 by mouth 1 hour  pre-procedure with very light food. May bring 2nd tablet to appointment. 2 tablet 0  . hydrochlorothiazide (HYDRODIURIL) 25 MG tablet Take 1 tablet (25 mg total) by mouth daily. 30 tablet 1  . lisinopril (PRINIVIL,ZESTRIL) 40 MG tablet Take 1 tablet (40 mg total) by mouth daily. 30 tablet 0  . pantoprazole (PROTONIX) 40 MG tablet Take 1 tablet (40 mg total) by mouth daily. 15 tablet 0  . silver sulfADIAZINE (SILVADENE) 1 % cream silver sulfadiazine 1 % topical cream  APPLY TO AFFECTED AREA DAILY     No current  facility-administered medications on file prior to visit.  Allergies:  No Known Allergies   Physical Exam  Vitals:   01/07/19 1301  BP: (!) 184/93  Pulse: 72  Weight: 231 lb 11.2 oz (105.1 kg)   Body mass index is 43.78 kg/m. No exam data present  General: Obese pleasant middle-aged African-American female, seated, flat affect Head: head normocephalic and atraumatic.   Neck: supple with no carotid or supraclavicular bruits Cardiovascular: regular rate and rhythm, no murmurs Musculoskeletal: no deformity Skin:  no rash/petichiae Vascular:  Normal pulses all extremities  Neurologic Exam Mental Status: Awake and fully alert. Oriented to place and time. Recent and remote memory intact. Attention span, concentration and fund of knowledge appropriate. Mood and affect appropriate.  Cranial Nerves: Pupils equal, briskly reactive to light. Extraocular movements full without nystagmus. Visual fields full to confrontation. Hearing intact. Facial sensation intact. Face, tongue, palate moves normally and symmetrically.  Motor: Normal bulk and tone. Normal strength in all tested extremity muscles. Sensory.: intact to touch , pinprick , position and vibratory sensation.  Coordination: Rapid alternating movements normal in all extremities. Finger-to-nose and heel-to-shin performed accurately bilaterally. Gait and Station: Arises from chair without difficulty. Stance is normal. Gait demonstrates hobbling walk.  Tandem gait not tested due to back pain. Reflexes: 1+ and symmetric. Toes downgoing.     Diagnostic Data (Labs, Imaging, Testing)  Ct Angio Head W Or Wo Contrast Result Date: 01/04/2018 IMPRESSION: Right lateral basal ganglia hemorrhage is stable. No underlying vascular malformation or tumor.   Ct Head Wo Contrast Result Date: 01/05/2018 IMPRESSION: No change in a right posterior basal ganglia/radiating white matter track intraparenchymal hematoma, measuring 3.3 x 2.3 x 1.5 cm  with mild surrounding edema and mild mass effect with right-to-left shift of 2 mm.  Ct Head Wo Contrast Result Date: 01/04/2018 IMPRESSION: 1.Focal area of hemorrhage, likely an acute hemorrhagic infarct, arising in the posterior right lentiform nucleus. 2. Atrophy with patchy supratentorial small vessel disease. No acute infarct beyond the presumed hemorrhagic infarct in the right lentiform nucleus. 3. No hemorrhagic focus elsewhere. No extra-axial fluid collection or midline shift. 4. Foci of arterial vascular calcification noted. 5. Mucosal thickening in several ethmoid air cells.   Dg Chest Port 1 View Result Date: 01/05/2018 IMPRESSION: No active disease.  Echocardiogram: Study Conclusions - Left ventricle: The cavity size was normal. There was mild concentric hypertrophy. Systolic function was normal. The estimated ejection fraction was in the range of 60% to 65%. Wall motion was normal; there were no regional wall motion abnormalities. Doppler parameters are consistent with abnormal left ventricular relaxation (grade 1 diastolic dysfunction). - Aortic valve: There was trivial regurgitation. - Left atrium: The atrium was mildly dilated.  B/L Carotid U/S:1-39% ICA plaquing. Vertebral artery flow is antegrade     ASSESSMENT: [name] is a 63 y.o. year old female here with [place] on 01/04/18 secondary to hypertension. Vascular risk factors include HTN, HLD and DM.  She returns today for follow-up visit and has been stable from a stroke standpoint without residual deficits or recurring of symptoms.  She does continue to experience left-sided sciatica with recent evaluation by orthopedics receiving spinal injections.   PLAN: -Continue aspirin 81 mg and Lipitor  for secondary stroke prevention -obtain EMG/nerve conduction due to continued back pain  -She denied sleep evaluation referral at this time but will call  office in the future if interested in obtaining.  Did educate her on increased risks with untreated OSA -Recommended continued use of heat/ice along with exercising as tolerated and Tylenol  for pain management -F/u with PCP regarding your HLD, DM, and HTN management -Highly encouraged monitoring BP at home to ensure satisfactory levels -Maintain strict control of hypertension with blood pressure goal below 130/90, diabetes with hemoglobin A1c goal below 6.5% and cholesterol with LDL cholesterol (bad cholesterol) goal below 70 mg/dL. I also advised the patient to eat a healthy diet with plenty of whole grains, cereals, fruits and vegetables, exercise regularly and maintain ideal body weight.  Follow-up will be determined after EMG/nerve conduction study  Greater than 50% time during this 25 minute consultation visit was spent on counseling and coordination of care about HLD, DM and HTN, discussion about risk benefit of anticoagulation and answering questions.    Venancio Poisson, AGNP-BC  [facility] [address] [place], [place] [address]  Phone [phone] Fax [phone]

## 2019-01-08 ENCOUNTER — Encounter: Payer: Self-pay | Admitting: Adult Health

## 2019-01-09 NOTE — Progress Notes (Signed)
I agree with the above plan 

## 2019-02-07 ENCOUNTER — Telehealth: Payer: Self-pay | Admitting: Neurology

## 2019-02-07 ENCOUNTER — Telehealth: Payer: Self-pay | Admitting: Adult Health

## 2019-02-07 NOTE — Telephone Encounter (Signed)
ERROR

## 2019-02-07 NOTE — Telephone Encounter (Signed)
Patient has been called and rescheduled for the middle of April.

## 2019-02-07 NOTE — Telephone Encounter (Signed)
Is this patient's NCV/EMG urgent? Or should it be rescheduled for a later date?

## 2019-02-07 NOTE — Telephone Encounter (Signed)
Please reschedule for later date. Thank you

## 2019-02-11 ENCOUNTER — Encounter: Payer: Medicare Other | Admitting: Neurology

## 2019-02-20 ENCOUNTER — Encounter: Payer: Self-pay | Admitting: Adult Health

## 2019-02-20 ENCOUNTER — Telehealth: Payer: Self-pay

## 2019-02-20 NOTE — Telephone Encounter (Signed)
I called patient to cancel her upcoming NCV/EMG with Dr. Krista Blue. She is asking if there is anything else she can try to help with the pain in her legs at least until we can get this test done. She says that she's tried gabapentin, tramadol, and physical therapy. I confirmed her pharmacy. Please call and advise.

## 2019-03-01 ENCOUNTER — Encounter: Payer: Medicare Other | Admitting: Neurology

## 2019-04-08 ENCOUNTER — Encounter (HOSPITAL_BASED_OUTPATIENT_CLINIC_OR_DEPARTMENT_OTHER): Payer: Self-pay | Admitting: Emergency Medicine

## 2019-04-08 ENCOUNTER — Emergency Department (HOSPITAL_BASED_OUTPATIENT_CLINIC_OR_DEPARTMENT_OTHER)
Admission: EM | Admit: 2019-04-08 | Discharge: 2019-04-08 | Disposition: A | Discharge status: Home / Self Care | Payer: Medicare Other | Attending: Emergency Medicine | Admitting: Emergency Medicine

## 2019-04-08 ENCOUNTER — Other Ambulatory Visit: Payer: Self-pay

## 2019-04-08 DIAGNOSIS — I129 Hypertensive chronic kidney disease with stage 1 through stage 4 chronic kidney disease, or unspecified chronic kidney disease: Secondary | ICD-10-CM | POA: Diagnosis not present

## 2019-04-08 DIAGNOSIS — B029 Zoster without complications: Secondary | ICD-10-CM | POA: Insufficient documentation

## 2019-04-08 DIAGNOSIS — Z85528 Personal history of other malignant neoplasm of kidney: Secondary | ICD-10-CM | POA: Insufficient documentation

## 2019-04-08 DIAGNOSIS — E1122 Type 2 diabetes mellitus with diabetic chronic kidney disease: Secondary | ICD-10-CM | POA: Diagnosis not present

## 2019-04-08 DIAGNOSIS — Z7982 Long term (current) use of aspirin: Secondary | ICD-10-CM | POA: Diagnosis not present

## 2019-04-08 DIAGNOSIS — R21 Rash and other nonspecific skin eruption: Secondary | ICD-10-CM | POA: Diagnosis present

## 2019-04-08 DIAGNOSIS — Z79899 Other long term (current) drug therapy: Secondary | ICD-10-CM | POA: Diagnosis not present

## 2019-04-08 DIAGNOSIS — N182 Chronic kidney disease, stage 2 (mild): Secondary | ICD-10-CM | POA: Diagnosis not present

## 2019-04-08 MED ORDER — TRAMADOL HCL 50 MG PO TABS: 50.0000 mg | ORAL_TABLET | Freq: Four times a day (QID) | ORAL | 0 refills | Status: DC | PRN

## 2019-04-08 MED ORDER — ACYCLOVIR 800 MG PO TABS: 800.0000 mg | ORAL_TABLET | Freq: Every day | ORAL | 0 refills | Status: AC

## 2019-04-08 NOTE — ED Provider Notes (Signed)
[facility] EMERGENCY DEPARTMENT Provider Note   CSN: [id number] Arrival date & time: 04/08/19  1254    History   Chief Complaint Chief Complaint  Patient presents with  . Rash    HPI [name] is a 63 y.o. female.     Patient presenting with new onset of an extensive rash on the left side of her chest and breast area.  Patient states she had a little bit of a burning sensation the past couple days.  Still has some burning sensation today but then this stents of rash was noted this morning.  Patient denies any fevers any upper respiratory symptoms.  No headache.  Patient's past medical history is significant for diabetes hypertension chronic kidney disease stage II.     Past Medical History:  Diagnosis Date  . Anemia   . Blood transfusion   . Blood transfusion without reported diagnosis   . CKD (chronic kidney disease) stage 2, GFR 60-89 ml/min   . Depression   . Diabetes mellitus   . Diverticulosis of colon with hemorrhage 2010, 09/2011  . H/O hiatal hernia   . Helicobacter pylori gastritis 09/13/2013  . Hx: UTI (urinary tract infection)   . Hypertension   . IBS (irritable bowel syndrome)   . Personal history of colonic polyps    adenomas since 2010  . Renal cell carcinoma 2004   lt kidney removed  . Stroke [facility]) 12/2017    Patient Active Problem List   Diagnosis Date Noted  . Hyperlipidemia   . ICH (intracerebral hemorrhage) ([place]) 01/04/2018  . Sepsis ([place]) 08/06/2016  . Diverticulitis 08/06/2016  . CKD (chronic kidney disease) stage 2, GFR 60-89 ml/min   . Diverticulitis of intestine without perforation or abscess without bleeding   . Neuralgia of chest 12/30/2015  . Atypical chest pain 12/01/2015  . Physical exam 10/26/2015  . 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 Maxillary sinusitis, acute 01/21/2015  . Strain of left pectoralis muscle  09/26/2014  . Benign paroxysmal positional vertigo 10/03/2013  . Nausea with vomiting 10/03/2013  . Bleeding nose 10/03/2013  . Helicobacter pylori gastritis 09/13/2013  . Pyuria 08/20/2013  . Abnormal abdominal CT scan 08/20/2013  . Abdominal pain 08/16/2013  . Back pain 07/31/2013  . LUQ pain 07/31/2013  . Laceration of hand 07/10/2013  . Hidradenitis suppurativa of right axilla 05/31/2013  . Renal cell carcinoma   . IBS (irritable bowel syndrome)   . S/p nephrectomy   . 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 OBESITY, MORBID 09/16/2009  . Essential hypertension 06/23/2009  . Personal history of colonoc adenomas 12/12/2008    Past Surgical History:  Procedure Laterality Date  . ABDOMINAL HYSTERECTOMY    . ABDOMINAL HYSTERECTOMY    . CHOLECYSTECTOMY    . COLONOSCOPY  12/19/2008   diverticulosis  . COLONOSCOPY  11/17/2011   Procedure: COLONOSCOPY;  Surgeon: Zenovia Jarred, MD;  Location: [facility];  Service: Gastroenterology;  Laterality: N/A;  . DILATION AND CURETTAGE OF UTERUS N/A 07/11/2014   Procedure: Repair of Vaginal Cuff;  Surgeon: Melina Schools, MD;  Location: [facility];  Service: Gynecology;  Laterality: N/A;  . FLEXIBLE SIGMOIDOSCOPY  12/12/2008   diverticulosis  . HAND SURGERY     x5 right  . HYDRADENITIS EXCISION Right 03/21/2014   Procedure: EXCISION HIDRADENITIS AXILLA;  Surgeon: Pedro Earls, MD;  Location: [facility];  Service: General;  Laterality: Right;  . KNEE ARTHROSCOPY     x2 left  . NEPHRECTOMY     left  . TRIGGER FINGER RELEASE Left 06/23/2015   Procedure: RELEASE TRIGGER FINGER/A-1 PULLEY LEFT RING FINGER;  Surgeon: Daryll Brod, MD;  Location: [facility];  Service: Orthopedics;  Laterality: Left;  . TUBAL LIGATION    . UPPER GASTROINTESTINAL ENDOSCOPY  12/12/2008   gastroporesis     OB History    Gravida  3   Para  3   Term      Preterm      AB      Living  3      SAB      TAB      Ectopic      Multiple      Live Births               Home Medications    Prior to Admission medications   Medication Sig Start Date End Date Taking? Authorizing Provider  acyclovir (ZOVIRAX) 800 MG tablet Take 1 tablet (800 mg total) by mouth 5 (five) times daily for 7 days. 04/08/19 04/15/19  Fredia Sorrow, MD  aspirin EC 81 MG tablet Take 1 tablet (81 mg total) by mouth daily. 02/19/18   Venancio Poisson, NP  atorvastatin (LIPITOR) 40 MG tablet Take 1 tablet (40 mg total) by mouth daily at 6 PM. 01/06/18   Mary Sella, NP  BYDUREON 2 MG PEN 2 mg. weekly 01/19/18   [provider]  diazepam (VALIUM) 5 MG tablet Take 1 by mouth 1 hour  pre-procedure with very light food. May bring 2nd tablet to appointment. 09/03/18   Magnus Sinning, MD  hydrochlorothiazide (HYDRODIURIL) 25 MG tablet Take 1 tablet (25 mg total) by mouth daily. 01/06/18   Mary Sella, NP  lisinopril (PRINIVIL,ZESTRIL) 40 MG tablet Take 1 tablet (40 mg total) by mouth daily. 08/12/16   Rosita Fire, MD  pantoprazole (PROTONIX) 40 MG tablet Take 1 tablet (40 mg total) by mouth daily. 08/11/16   Rosita Fire, MD  silver sulfADIAZINE (SILVADENE) 1 % cream silver sulfadiazine 1 % topical cream  APPLY TO AFFECTED AREA DAILY    [provider]  traMADol (ULTRAM) 50 MG tablet Take 1 tablet (50 mg total) by mouth every 6 (six) hours as needed. 04/08/19   Fredia Sorrow, MD    Family History Family History  Problem Relation Age of Onset  . Colon polyps Mother   . Diabetes Maternal Grandmother   . Breast cancer Maternal Grandmother   . Diabetes Father   . Breast cancer Daughter   . Colon cancer Neg Hx     Social History Social History   Tobacco Use  . Smoking status: Never Smoker  . Smokeless tobacco: Never Used  Substance Use Topics  . Alcohol use: No    Alcohol/week: 0.0 standard drinks  . Drug use: No     Allergies   Patient has no known  allergies.   Review of Systems Review of Systems  Constitutional: Negative for chills and fever.  HENT: Negative for congestion, rhinorrhea and sore throat.   Eyes: Negative for pain, redness and visual disturbance.  Respiratory: Negative for cough and shortness of breath.   Cardiovascular: Negative for chest pain and leg swelling.  Gastrointestinal: Negative for abdominal pain, diarrhea, nausea and vomiting.  Genitourinary: Negative for dysuria.  Musculoskeletal: Negative for back pain and neck pain.  Skin: Positive for rash.  Neurological: Negative for dizziness, light-headedness and headaches.  Hematological: Does not bruise/bleed easily.  Psychiatric/Behavioral: Negative for confusion.     Physical Exam Updated Vital Signs BP (!) 166/84 (BP Location: Right Arm)   Pulse 84   Temp 99 F (37.2 C) (Oral)   Resp 16   Ht 1.562 m (5' 1.5")   Wt 102.1 kg   LMP 11/16/2002   SpO2 98%   BMI 41.82 kg/m   Physical Exam Vitals signs and nursing note reviewed.  Constitutional:      General: She is not in acute distress.    Appearance: Normal appearance. She is well-developed.  HENT:     Head: Normocephalic and atraumatic.  Eyes:     Extraocular Movements: Extraocular movements intact.     Conjunctiva/sclera: Conjunctivae normal.     Pupils: Pupils are equal, round, and reactive to light.  Neck:     Musculoskeletal: Normal range of motion and neck supple.  Cardiovascular:     Rate and Rhythm: Normal rate and regular rhythm.     Heart sounds: No murmur.  Pulmonary:     Effort: Pulmonary effort is normal. No respiratory distress.     Breath sounds: Normal breath sounds. No wheezing or rales.  Abdominal:     General: Bowel sounds are normal.     Palpations: Abdomen is soft.     Tenderness: There is no abdominal tenderness.  Musculoskeletal: Normal range of motion.  Skin:    General: Skin is warm and dry.     Capillary Refill: Capillary refill takes less than 2 seconds.      Findings: Lesion and rash present.     Comments: Extensive vesicular rash left-sided chest wall about T6 dermatome.  From midline all the way into the left breast area.  Neurological:     General: No focal deficit present.     Mental Status: She is alert and oriented to person, place, and time.     Cranial Nerves: No cranial nerve deficit.     Sensory: No sensory deficit.      ED Treatments / Results  Labs (all labs ordered are listed, but only abnormal results are displayed) Labs Reviewed - No data to display  EKG None  Radiology No results found.  Procedures Procedures (including critical care time)  Medications Ordered in ED Medications - No data to display   Initial Impression / Assessment and Plan / ED Course  I have reviewed the triage vital signs and the nursing notes.  Pertinent labs & imaging results that were available during my care of the patient were reviewed by me and considered in my medical decision making (see chart for details).        Patient with extensive vesicular rash left-sided dermatome and around T6.  Patient with very minimal discomfort.  Which is fortunate for her.  Patient without fever no complicating findings.  The rash just broke out today.  Patient will be started on acyclovir for 7 days.  And also given tramadol for pain she has a primary care doctor to follow-up with.  Patient understands that the rash itself can be contagious.  Final Clinical Impressions(s) / ED Diagnoses   Final diagnoses:  Herpes zoster without complication    ED Discharge Orders         Ordered    acyclovir (ZOVIRAX) 800 MG tablet  5 times daily     04/08/19 1405    traMADol (ULTRAM) 50 MG tablet  Every 6 hours PRN     04/08/19 1406           Fredia Sorrow, MD 04/08/19 1414

## 2019-04-08 NOTE — ED Notes (Signed)
Pt verbalized understanding of dc instructions.

## 2019-04-08 NOTE — ED Triage Notes (Signed)
Pt reports she noticed a scaly rash to L breast this morning.

## 2019-04-08 NOTE — Discharge Instructions (Addendum)
Take the acyclovir as directed for the next 7 days.  Take tramadol as needed for pain.  Make an appointment to follow-up with your regular doctor in the next few days.  Return for any new or worse symptoms.

## 2019-05-29 ENCOUNTER — Other Ambulatory Visit: Payer: Self-pay

## 2019-05-29 ENCOUNTER — Encounter (INDEPENDENT_AMBULATORY_CARE_PROVIDER_SITE_OTHER): Payer: Medicare Other | Admitting: Neurology

## 2019-05-29 ENCOUNTER — Telehealth: Payer: Self-pay | Admitting: *Deleted

## 2019-05-29 ENCOUNTER — Ambulatory Visit (INDEPENDENT_AMBULATORY_CARE_PROVIDER_SITE_OTHER): Payer: Medicare Other | Admitting: Neurology

## 2019-05-29 DIAGNOSIS — M5442 Lumbago with sciatica, left side: Secondary | ICD-10-CM | POA: Diagnosis not present

## 2019-05-29 DIAGNOSIS — R202 Paresthesia of skin: Secondary | ICD-10-CM | POA: Diagnosis not present

## 2019-05-29 DIAGNOSIS — Z0289 Encounter for other administrative examinations: Secondary | ICD-10-CM

## 2019-05-29 NOTE — Procedures (Signed)
Full Name: [name] Gender: Female MRN #: [id number] Date of Birth: [birth year]    Visit Date: 05/29/2019 08:05 Age: 63 Years 88 Months Old Examining Physician: Marcial Pacas, MD  Referring Physician: Venancio Poisson, NP History:   63 years old female with history of right basal ganglion hemorrhagic stroke, presented with low back pain, radiating pain to left leg, left foot paresthesia. On exam: Left arm fixation on rapid rotating movement. Dragging left leg while ambulating. Hyperreflexia on left side, left planter response was extensor.   Summary of the Tests:  Nerve Conduction Study: Bilateral sural sensory responses were normal. Bilateral superficial sensory responses showed mildly decreased SNAP amplitude.  Bilateral peroneal to EDB motor responses were normal.  Bilateral tibial motor responses showed mildly decreased CMAP amplitude, with normal conduction velocity. H reflexes were normal and symmetric.  Electromyography: Selected needle examination of bilateral lower extremity muscles were normal. There was no spontaneous activities at bilateral lumbar paraspinal muscles.    Conclusion: This is a mild abnormal study. 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 There is no evidence of bilateral lumbosacral radiculopathy.    ------------------------------- Marcial Pacas, M.D.Ph.D.  [facility] [place], [place] [address] Tel: [phone] Fax: [phone]        [facility]    Nerve / Sites Muscle Latency Ref. Amplitude Ref. Rel Amp Segments Distance Velocity Ref. Area    ms ms mV mV %  cm m/s m/s mVms  L Peroneal - EDB     Ankle EDB 5.2 ?6.5 2.4 ?2.0 100 Ankle - EDB 9   9.2     Fib head EDB 10.6  2.3  93.6 Fib head - Ankle 25 46 ?44 9.4     Pop fossa EDB 13.1  2.4  103 Pop fossa - Fib head 10 40 ?44 9.0         Pop fossa - Ankle      R Peroneal - EDB     Ankle EDB 4.9 ?6.5 3.6 ?2.0 100 Ankle -  EDB 9   11.8     Fib head EDB 10.2  3.3  89.8 Fib head - Ankle 25 48 ?44 12.2     Pop fossa EDB 12.4  3.3  103 Pop fossa - Fib head 10 45 ?44 13.8         Pop fossa - Ankle      L Tibial - AH     Ankle AH 3.5 ?5.8 2.5 ?4.0 100 Ankle - AH 9   9.8     Pop fossa AH 11.3  2.2  86.7 Pop fossa - Ankle 35 45 ?41 8.4  R Tibial - AH     Ankle AH 4.1 ?5.8 2.3 ?4.0 100 Ankle - AH 9   7.0     Pop fossa AH 12.1  2.0  89.6 Pop fossa - Ankle 36 45 ?41 6.2             SNC    Nerve / Sites Rec. Site Peak Lat Ref.  Amp Ref. Segments Distance    ms ms V V  cm  L Sural - Ankle (Calf)     Calf Ankle 3.5 ?4.4 6 ?6 Calf - Ankle 14  R Sural - Ankle (Calf)     Calf Ankle 3.4 ?4.4 6 ?6 Calf - Ankle 14  L Superficial peroneal - Ankle     Lat leg Ankle 3.9 ?4.4 3 ?6 Lat  leg - Ankle 14  R Superficial peroneal - Ankle     Lat leg Ankle 4.0 ?4.4 4 ?6 Lat leg - Ankle 14              F  Wave    Nerve F Lat Ref.   ms ms  L Tibial - AH 52.2 ?56.0  R Tibial - AH 54.8 ?56.0         H Reflex    Nerve H Lat    ms    Left Right Ref.     Tibial - [place] 33.7 34.9 ?35.0            EMG       EMG Summary Table    Spontaneous MUAP Recruitment  Muscle IA Fib PSW Fasc Other Amp Dur. Poly Pattern  L. Tibialis anterior Normal None None None _______ Normal Normal Normal Normal  L. Tibialis posterior Normal None None None _______ Normal Normal Normal Normal  L. Peroneus longus Normal None None None _______ Normal Normal Normal Normal  L. Vastus lateralis Normal None None None _______ Normal Normal Normal Normal  L. Gluteus medius Normal None None None _______ Normal Normal Normal Normal  L. Lumbar paraspinals (mid) Normal None None None _______ Normal Normal Normal Normal  L. Lumbar paraspinals (low) Normal None None None _______ Normal Normal Normal Normal  R. Tibialis anterior Normal None None None _______ Normal Normal Normal Normal  R. Tibialis posterior Normal None None None _______ Normal Normal Normal  Normal  R. Peroneus longus Normal None None None _______ Normal Normal Normal Normal  R. Vastus lateralis Normal None None None _______ Normal Normal Normal Normal  R. Lumbar paraspinals (mid) Normal None None None _______ Normal Normal Normal Normal

## 2019-05-29 NOTE — Telephone Encounter (Signed)
Spoke with patient and informed her EMG did not show evidence of nerve impingement within her lower back that could be causing symptoms. It did show mild neuropathy in her bilateral lower extremities which could be due to her diabetes. She stated she is keeping her diabetes under good control, last A1C was 7. I advised she continue to control her diabetes. She asked if a copy will sent be a PCP. I advised her that she will see the result in her my chart in a few days. She  verbalized understanding, appreciation.

## 2019-09-09 ENCOUNTER — Ambulatory Visit: Payer: Medicare Other | Admitting: Adult Health

## 2019-09-09 ENCOUNTER — Other Ambulatory Visit: Payer: Self-pay

## 2019-09-09 ENCOUNTER — Encounter: Payer: Self-pay | Admitting: Adult Health

## 2019-09-09 VITALS — BP 175/98 | HR 97 | Temp 97.7°F | Ht 61.0 in | Wt 236.2 lb

## 2019-09-09 DIAGNOSIS — Z794 Long term (current) use of insulin: Secondary | ICD-10-CM

## 2019-09-09 DIAGNOSIS — I610 Nontraumatic intracerebral hemorrhage in hemisphere, subcortical: Secondary | ICD-10-CM | POA: Diagnosis not present

## 2019-09-09 DIAGNOSIS — E785 Hyperlipidemia, unspecified: Secondary | ICD-10-CM

## 2019-09-09 DIAGNOSIS — I10 Essential (primary) hypertension: Secondary | ICD-10-CM

## 2019-09-09 DIAGNOSIS — E1140 Type 2 diabetes mellitus with diabetic neuropathy, unspecified: Secondary | ICD-10-CM

## 2019-09-09 DIAGNOSIS — E119 Type 2 diabetes mellitus without complications: Secondary | ICD-10-CM | POA: Diagnosis not present

## 2019-09-09 NOTE — Progress Notes (Signed)
[facility] [address] [place]. [place] [address] [phone]       OFFICE FOLLOW UP NOTE  [name] Date of Birth:  December 01, 1955 Medical Record Number:  [id number]   Reason for Referral:  hospital stroke follow up  CHIEF COMPLAINT:  Chief Complaint  Patient presents with   Follow-up    8 mon f/u. Alone. Treatment room. Patient would like to know if she will have another cerebral hemorrhage since she had her first one. She stated that she is having issues with sleeping due the burning sensation. She stated that she feels like she has a cast on her leg.     HPI:  Update 09/09/2019: [name] is a 63 year old female who is being seen today for 54-month follow-up.  She has been stable from a stroke standpoint and continues on aspirin 81 mg and atorvastatin for secondary stroke prevention without side effects.  Blood pressure today elevated at visit but does monitor at home which has been stable.  Recent A1c satisfactory.  She did undergo NCS/EMG on 05/29/2019 due to left leg radiculopathy which did show mild BLE neuropathy but no evidence of nerve impingement. She continues to have paraesthesias on left side.  She feels as though she has a boot on her left foot.  States she was evaluated by "diabetic pain doctor" and told paresthesias likely post stroke and not due to diabetic neuropathy.  She has trialed gabapentin in the past without benefit.  Continues to experience bilateral lower back pain and plans on reaching out to orthopedics for possible repeat injections.  No further concerns at this time.    Update 01/07/2019: [name] is a 63 year old female who is being seen today for 81-month follow-up visit regarding [place] in 12/2017.  She has been stable from a stroke standpoint without residual deficits or new symptoms.  She continues on aspirin 81 mg without side effects of bleeding or bruising.  Continues on atorvastatin without side effects of  myalgias.  Blood pressure today elevated at 184/93, asymptomatic.  She does endorse increased stress and is actively being treated by her PCP for depression/anxiety.  Her overall sleep is fragmented as she is raising her 2 grandchildren as her daughter passed away 2 years prior.  She does endorse daytime fatigue and can nap easily.  She is not aware if she snores.  She does not monitor BP at home but does plan on obtaining cuff in order to start monitoring.  Continues to monitor glucose levels at home which have been stable.  Her overall sleep At prior visit, discussion regarding radiating nerve pain and as conservative measures did not provide benefit, CT lumbar spine obtained as she is unable to tolerate MRIs.  CT lumbar spine on 07/12/2018 showed severe lower lumbar facet arthrosis and minimal disc bulging without stenosis.  She was referred to Ortho for potential further treatment recommendations.  She did receive lumbar facet joint injection on 09/03/2018 by Dr. Ernestina Patches.  He does not appear as though she has followed up with Ortho since this time.  She does endorse improvement of pain around her lumbar region but does continue to experience radiating numbness/tingling down her left leg.  No further concerns at this time.   HISTORY 07/05/2018: Patient returns today for follow-up visit.  CT head was obtained and as it showed resolution of previous ICH recommended to start aspirin 81 mg for secondary stroke prevention.  Was also recommended to start gabapentin 300 mg at previous visit for  possible neuropathy or sciatica. patient called on 02/26/2018 stating that she was tolerating well but continued to have pain.  Recommended increasing dose of gabapentin 300 mg 3 times daily. She unfortunately did not benefit from increased dose of gabapentin and has since stopped taking it.  She has attempted using both Advil and Tylenol without any benefit.  Patient was seen by PCP on 06/04/2018 for sciatica of left side and  recommended physical therapy.  Patient did PT for 4 weeks without benefit.  She has attempted to use both heat and ice along with increasing exercise such as walking and aqua therapy but this again has not provided benefit.  This back pain has been limiting certain activities that patient is able to do along with interfering with her nightly sleep.  She states she does use pillows between her legs but is unable to lay on her left side due to the pain.  Patient continues to take aspirin 81 mg without side effects of bleeding or bruising.  Continues to take Lipitor without side effects myalgias blood pressure today satisfactory 1 3489.  Patient denies new or worsening stroke/TIA symptoms.  INITIAL VISIT 02/13/18: [name] is being seen today in the office for acute posterior right lentiform nucleus hemorrhage on 01/04/18. History obtained from patient and chart review. Reviewed all radiology images and labs personally.  [name] a 62 y.o.femalepast history of CKD stage II, hypertension, diabetes, depression, who was last known normal last night on 01/03/18, was brought in by EMS when she woke up this morning on 01/04/18 and had a fall. She said that she fell multiple times this morning and felt weak on one side of the body on the left. She denies any headaches. She denies any visual changes. She had recent pain from previous umbilical hernia which increased her blood pressure but does states she was compliant on hypertension medications. No history of strokes in the past. CT head reviewed and showed focal area of hemorrhage, likely an acute hemorrhagic infarct. CTA head showed right lateral basal ganglia hemorrhage which was stable. 2D echo showed an EF of 60-65%. B/L carotid U/S showed bilateral 1-39% ICA plaquing. LDL 118 and A1c 7.0.  No antithrombotic after ICH. Start on lipitor 40mg . Patient d/c'd home in stable conditions.    Since discharge patient has been doing well.  She  continues to take Lipitor without increase in myalgias.  Blood pressure today satisfactory 135/83.  Does monitor this when she goes to [facility] or [facility] and is considering purchasing an at home blood pressure cuff.  She does complain of left leg numbness with a burning sensation that goes down from her hip/lower back and radiates down into her foot.  She states that she does have a history of neuropathy and that this started approximately 2 weeks ago.  She states that she was compliant with blood pressure medications prior to her [place] but she was having increased pain from umbilical hernia and this made her blood pressure increase.  No new or worsening stroke/TIA symptoms.      ROS:   14 system review of systems performed and negative with exception of numbness/tingling  PMH:  Past Medical History:  Diagnosis Date   Anemia    Blood transfusion    Blood transfusion without reported diagnosis    CKD (chronic kidney disease) stage 2, GFR 60-89 ml/min    Depression    Diabetes mellitus    Diverticulosis of colon with hemorrhage 2010, 09/2011   H/O hiatal  hernia    Helicobacter pylori gastritis 09/13/2013   Hx: UTI (urinary tract infection)    Hypertension    IBS (irritable bowel syndrome)    Personal history of colonic polyps    adenomas since 2010   Renal cell carcinoma 2004   lt kidney removed   Stroke ([place]) 12/2017    PSH:  Past Surgical History:  Procedure Laterality Date   ABDOMINAL HYSTERECTOMY     ABDOMINAL HYSTERECTOMY     CHOLECYSTECTOMY     COLONOSCOPY  12/19/2008   diverticulosis   COLONOSCOPY  11/17/2011   Procedure: COLONOSCOPY;  Surgeon: Zenovia Jarred, MD;  Location: [facility];  Service: Gastroenterology;  Laterality: N/A;   DILATION AND CURETTAGE OF UTERUS N/A 07/11/2014   Procedure: Repair of Vaginal Cuff;  Surgeon: Melina Schools, MD;  Location: [facility];  Service: Gynecology;  Laterality: N/A;   FLEXIBLE SIGMOIDOSCOPY  12/12/2008   diverticulosis     HAND SURGERY     x5 right   HYDRADENITIS EXCISION Right 03/21/2014   Procedure: EXCISION HIDRADENITIS AXILLA;  Surgeon: Pedro Earls, MD;  Location: [facility];  Service: General;  Laterality: Right;   KNEE ARTHROSCOPY     x2 left   NEPHRECTOMY     left   TRIGGER FINGER RELEASE Left 06/23/2015   Procedure: RELEASE TRIGGER FINGER/A-1 PULLEY LEFT RING FINGER;  Surgeon: Daryll Brod, MD;  Location: [facility];  Service: Orthopedics;  Laterality: Left;   TUBAL LIGATION     UPPER GASTROINTESTINAL ENDOSCOPY  12/12/2008   gastroporesis    Social History:  Social History   Socioeconomic History   Marital status: Divorced    Spouse name: Not on file   Number of children: 3   Years of education: Not on file   Highest education level: Not on file  Occupational History   Occupation: Dispensing optician strain: Not on file   Food insecurity    Worry: Not on file    Inability: Not on file   Transportation needs    Medical: Not on file    Non-medical: Not on file  Tobacco Use   Smoking status: Never Smoker   Smokeless tobacco: Never Used  Substance and Sexual Activity   Alcohol use: No    Alcohol/week: 0.0 standard drinks   Drug use: No   Sexual activity: Never  Lifestyle   Physical activity    Days per week: Not on file    Minutes per session: Not on file   Stress: Not on file  Relationships   Social connections    Talks on phone: Not on file    Gets together: Not on file    Attends religious service: Not on file    Active member of club or organization: Not on file    Attends meetings of clubs or organizations: Not on file    Relationship status: Not on file   Intimate partner violence    Fear of current or ex partner: Not on file    Emotionally abused: Not on file    Physically abused: Not on file    Forced sexual activity: Not on file  Other Topics Concern   Not on file  Social  History Narrative   Regular exercise: walk 2 days, aerobics 3 days[facility]   Caffeine use: coffee every AM    Family History:  Family History  Problem Relation Age of Onset  Colon polyps Mother    Diabetes Maternal Grandmother    Breast cancer Maternal Grandmother    Diabetes Father    Breast cancer Daughter    Colon cancer Neg Hx     Medications:   Current Outpatient Medications on File Prior to Visit  Medication Sig Dispense Refill   aspirin EC 81 MG tablet Take 1 tablet (81 mg total) by mouth daily. (Patient not taking: Reported on 09/09/2019)     atorvastatin (LIPITOR) 40 MG tablet Take 1 tablet (40 mg total) by mouth daily at 6 PM. (Patient not taking: Reported on 09/09/2019) 30 tablet 1   BYDUREON 2 MG PEN 2 mg. weekly     diazepam (VALIUM) 5 MG tablet Take 1 by mouth 1 hour  pre-procedure with very light food. May bring 2nd tablet to appointment. (Patient not taking: Reported on 09/09/2019) 2 tablet 0   hydrochlorothiazide (HYDRODIURIL) 25 MG tablet Take 1 tablet (25 mg total) by mouth daily. (Patient not taking: Reported on 09/09/2019) 30 tablet 1   lisinopril (PRINIVIL,ZESTRIL) 40 MG tablet Take 1 tablet (40 mg total) by mouth daily. (Patient not taking: Reported on 09/09/2019) 30 tablet 0   pantoprazole (PROTONIX) 40 MG tablet Take 1 tablet (40 mg total) by mouth daily. (Patient not taking: Reported on 09/09/2019) 15 tablet 0   silver sulfADIAZINE (SILVADENE) 1 % cream silver sulfadiazine 1 % topical cream  APPLY TO AFFECTED AREA DAILY     traMADol (ULTRAM) 50 MG tablet Take 1 tablet (50 mg total) by mouth every 6 (six) hours as needed. (Patient not taking: Reported on 09/09/2019) 15 tablet 0   No current facility-administered medications on file prior to visit.     Allergies:  No Known Allergies   Physical Exam  Vitals:   09/09/19 1345  BP: (!) 175/98  Pulse: 97  Temp: 97.7 F (36.5 C)  TempSrc: Oral  Weight: 236 lb 3.2 oz  (107.1 kg)  Height: 5\' 1"  (1.549 m)   Body mass index is 44.63 kg/m. No exam data present  General: Obese pleasant middle-aged African-American female, seated, flat affect Head: head normocephalic and atraumatic.   Neck: supple with no carotid or supraclavicular bruits Cardiovascular: regular rate and rhythm, no murmurs Musculoskeletal: no deformity Skin:  no rash/petichiae Vascular:  Normal pulses all extremities  Neurologic Exam Mental Status: Awake and fully alert. Oriented to place and time. Recent and remote memory intact. Attention span, concentration and fund of knowledge appropriate. Mood and affect appropriate.  Cranial Nerves: Pupils equal, briskly reactive to light. Extraocular movements full without nystagmus. Visual fields full to confrontation. Hearing intact. Facial sensation intact. Face, tongue, palate moves normally and symmetrically.  Motor: Normal bulk and tone. Normal strength in all tested extremity muscles. Sensory.:  Decreased sensation bilateral lower extremities Coordination: Rapid alternating movements normal in all extremities. Finger-to-nose and heel-to-shin performed accurately bilaterally. Gait and Station: Arises from chair without difficulty. Stance is normal. Gait demonstrates hobbling walk.  Tandem gait not tested due to back pain. Reflexes: 1+ and symmetric. Toes downgoing.     Diagnostic Data (Labs, Imaging, Testing)  Ct Angio Head W Or Wo Contrast Result Date: 01/04/2018 IMPRESSION: Right lateral basal ganglia hemorrhage is stable. No underlying vascular malformation or tumor.   Ct Head Wo Contrast Result Date: 01/05/2018 IMPRESSION: No change in a right posterior basal ganglia/radiating white matter track intraparenchymal hematoma, measuring 3.3 x 2.3 x 1.5 cm with mild surrounding edema and mild mass effect with right-to-left shift of 2 mm.  Ct Head Wo Contrast Result Date: 01/04/2018 IMPRESSION: 1.Focal area of hemorrhage, likely an  acute hemorrhagic infarct, arising in the posterior right lentiform nucleus. 2. Atrophy with patchy supratentorial small vessel disease. No acute infarct beyond the presumed hemorrhagic infarct in the right lentiform nucleus. 3. No hemorrhagic focus elsewhere. No extra-axial fluid collection or midline shift. 4. Foci of arterial vascular calcification noted. 5. Mucosal thickening in several ethmoid air cells.   Dg Chest Port 1 View Result Date: 01/05/2018 IMPRESSION: No active disease.  Echocardiogram: Study Conclusions - Left ventricle: The cavity size was normal. There was mild concentric hypertrophy. Systolic function was normal. The estimated ejection fraction was in the range of 60% to 65%. Wall motion was normal; there were no regional wall motion abnormalities. Doppler parameters are consistent with abnormal left ventricular relaxation (grade 1 diastolic dysfunction). - Aortic valve: There was trivial regurgitation. - Left atrium: The atrium was mildly dilated.  B/L Carotid U/S:1-39% ICA plaquing. Vertebral artery flow is antegrade     ASSESSMENT: [name] is a 63 y.o. year old female here with [place] on 01/04/18 secondary to hypertension. Vascular risk factors include HTN, HLD and DM.  She returns today for follow-up visit and has been stable from a stroke standpoint.  Possible residual LLE paresthesias post stroke versus diabetic neuropathy.   PLAN: -Initiate neuropathic transdermal cream to bilateral lower extremities for ongoing paresthesias -Advised to reach out to orthopedics regarding continued lower back pain -Continue aspirin 81 mg and Lipitor  for secondary stroke prevention -F/u with PCP regarding your HLD, DM, and HTN management -Advised to monitor BP at home to ensure satisfactory level -Maintain strict control of hypertension with blood pressure goal below 130/90, diabetes with hemoglobin A1c goal  below 6.5% and cholesterol with LDL cholesterol (bad cholesterol) goal below 70 mg/dL. I also advised the patient to eat a healthy diet with plenty of whole grains, cereals, fruits and vegetables, exercise regularly and maintain ideal body weight.  Follow-up in 6 months or call earlier if needed  Greater than 50% time during this 25 minute visit was spent on counseling and coordination of care about HLD, DM and HTN, discussion regarding left-sided paresthesias, reviewed prior EMG results and answered all questions to patient satisfaction   Frann Rider is in, [facility]  [facility] [address] [place], [place] [address]  Phone [phone] Fax [phone]

## 2019-09-09 NOTE — Patient Instructions (Signed)
We will trial use of a nerve pain cream - you will be contacted by pharmacy  Continue aspirin 81 mg daily  and lipitor  for secondary stroke prevention  Continue to follow up with PCP regarding cholesterol, diabetes and blood pressure management   Continue to monitor blood pressure at home  Maintain strict control of hypertension with blood pressure goal below 130/90, diabetes with hemoglobin A1c goal below 6.5% and cholesterol with LDL cholesterol (bad cholesterol) goal below 70 mg/dL. I also advised the patient to eat a healthy diet with plenty of whole grains, cereals, fruits and vegetables, exercise regularly and maintain ideal body weight.  Followup in the future with me in 6 months or call earlier if needed       Thank you for coming to see [place] at [facility]. I hope we have been able to provide you high quality care today.  You may receive a patient satisfaction survey over the next few weeks. We would appreciate your feedback and comments so that we may continue to improve ourselves and the health of our patients.

## 2019-09-10 NOTE — Progress Notes (Signed)
I agree with the above plan 

## 2019-10-03 ENCOUNTER — Telehealth: Payer: Self-pay | Admitting: *Deleted

## 2019-10-03 NOTE — Telephone Encounter (Signed)
Received notice by fax that transdermal therapeutic did fax from about substitution of formula for pt.  Meloxicam 0.5%, doxepin 3%, amantadine 3%, dextromethorphan 2%, lidocaine 2%.  [phone].

## 2019-10-20 ENCOUNTER — Inpatient Hospital Stay (HOSPITAL_COMMUNITY)
Admission: EM | Admit: 2019-10-20 | Discharge: 2019-10-24 | DRG: 683 | Disposition: A | Discharge status: Home / Self Care | Payer: Medicare Other | Attending: Internal Medicine | Admitting: Internal Medicine

## 2019-10-20 ENCOUNTER — Encounter (HOSPITAL_COMMUNITY): Payer: Self-pay | Admitting: Emergency Medicine

## 2019-10-20 ENCOUNTER — Other Ambulatory Visit: Payer: Self-pay

## 2019-10-20 DIAGNOSIS — K3184 Gastroparesis: Secondary | ICD-10-CM | POA: Diagnosis present

## 2019-10-20 DIAGNOSIS — Z833 Family history of diabetes mellitus: Secondary | ICD-10-CM

## 2019-10-20 DIAGNOSIS — E11319 Type 2 diabetes mellitus with unspecified diabetic retinopathy without macular edema: Secondary | ICD-10-CM | POA: Diagnosis present

## 2019-10-20 DIAGNOSIS — E1143 Type 2 diabetes mellitus with diabetic autonomic (poly)neuropathy: Secondary | ICD-10-CM | POA: Diagnosis present

## 2019-10-20 DIAGNOSIS — N1831 Chronic kidney disease, stage 3a: Secondary | ICD-10-CM | POA: Diagnosis present

## 2019-10-20 DIAGNOSIS — K449 Diaphragmatic hernia without obstruction or gangrene: Secondary | ICD-10-CM | POA: Diagnosis present

## 2019-10-20 DIAGNOSIS — K2990 Gastroduodenitis, unspecified, without bleeding: Secondary | ICD-10-CM | POA: Diagnosis present

## 2019-10-20 DIAGNOSIS — I10 Essential (primary) hypertension: Secondary | ICD-10-CM | POA: Diagnosis present

## 2019-10-20 DIAGNOSIS — Z8711 Personal history of peptic ulcer disease: Secondary | ICD-10-CM

## 2019-10-20 DIAGNOSIS — I152 Hypertension secondary to endocrine disorders: Secondary | ICD-10-CM | POA: Diagnosis present

## 2019-10-20 DIAGNOSIS — R109 Unspecified abdominal pain: Secondary | ICD-10-CM | POA: Diagnosis present

## 2019-10-20 DIAGNOSIS — E785 Hyperlipidemia, unspecified: Secondary | ICD-10-CM | POA: Diagnosis present

## 2019-10-20 DIAGNOSIS — N179 Acute kidney failure, unspecified: Principal | ICD-10-CM | POA: Diagnosis present

## 2019-10-20 DIAGNOSIS — E860 Dehydration: Secondary | ICD-10-CM | POA: Diagnosis present

## 2019-10-20 DIAGNOSIS — E1122 Type 2 diabetes mellitus with diabetic chronic kidney disease: Secondary | ICD-10-CM | POA: Diagnosis present

## 2019-10-20 DIAGNOSIS — Z85528 Personal history of other malignant neoplasm of kidney: Secondary | ICD-10-CM

## 2019-10-20 DIAGNOSIS — E1159 Type 2 diabetes mellitus with other circulatory complications: Secondary | ICD-10-CM | POA: Diagnosis present

## 2019-10-20 DIAGNOSIS — N182 Chronic kidney disease, stage 2 (mild): Secondary | ICD-10-CM | POA: Diagnosis present

## 2019-10-20 DIAGNOSIS — K2970 Gastritis, unspecified, without bleeding: Secondary | ICD-10-CM

## 2019-10-20 DIAGNOSIS — K5790 Diverticulosis of intestine, part unspecified, without perforation or abscess without bleeding: Secondary | ICD-10-CM | POA: Diagnosis present

## 2019-10-20 DIAGNOSIS — Z6841 Body Mass Index (BMI) 40.0 and over, adult: Secondary | ICD-10-CM

## 2019-10-20 DIAGNOSIS — E1165 Type 2 diabetes mellitus with hyperglycemia: Secondary | ICD-10-CM | POA: Diagnosis present

## 2019-10-20 DIAGNOSIS — Z8673 Personal history of transient ischemic attack (TIA), and cerebral infarction without residual deficits: Secondary | ICD-10-CM

## 2019-10-20 DIAGNOSIS — I619 Nontraumatic intracerebral hemorrhage, unspecified: Secondary | ICD-10-CM | POA: Diagnosis present

## 2019-10-20 DIAGNOSIS — N183 Chronic kidney disease, stage 3 unspecified: Secondary | ICD-10-CM | POA: Diagnosis present

## 2019-10-20 DIAGNOSIS — Z20828 Contact with and (suspected) exposure to other viral communicable diseases: Secondary | ICD-10-CM | POA: Diagnosis present

## 2019-10-20 DIAGNOSIS — R1012 Left upper quadrant pain: Secondary | ICD-10-CM

## 2019-10-20 DIAGNOSIS — Z79899 Other long term (current) drug therapy: Secondary | ICD-10-CM

## 2019-10-20 DIAGNOSIS — K219 Gastro-esophageal reflux disease without esophagitis: Secondary | ICD-10-CM | POA: Diagnosis present

## 2019-10-20 DIAGNOSIS — K2900 Acute gastritis without bleeding: Secondary | ICD-10-CM | POA: Diagnosis present

## 2019-10-20 DIAGNOSIS — Z87440 Personal history of urinary (tract) infections: Secondary | ICD-10-CM

## 2019-10-20 DIAGNOSIS — Z905 Acquired absence of kidney: Secondary | ICD-10-CM

## 2019-10-20 LAB — COMPREHENSIVE METABOLIC PANEL
ALT: 18 U/L (ref 0–44)
AST: 18 U/L (ref 15–41)
Albumin: 3 g/dL — ABNORMAL LOW (ref 3.5–5.0)
Alkaline Phosphatase: 86 U/L (ref 38–126)
Anion gap: 13 (ref 5–15)
BUN: 29 mg/dL — ABNORMAL HIGH (ref 8–23)
CO2: 22 mmol/L (ref 22–32)
Calcium: 9.3 mg/dL (ref 8.9–10.3)
Chloride: 101 mmol/L (ref 98–111)
Creatinine, Ser: 2.4 mg/dL — ABNORMAL HIGH (ref 0.44–1.00)
GFR calc Af Amer: 24 mL/min — ABNORMAL LOW (ref >60)
GFR calc non Af Amer: 21 mL/min — ABNORMAL LOW (ref >60)
Glucose, Bld: 198 mg/dL — ABNORMAL HIGH (ref 70–99)
Potassium: 3.6 mmol/L (ref 3.5–5.1)
Sodium: 136 mmol/L (ref 135–145)
Total Bilirubin: 0.4 mg/dL (ref 0.3–1.2)
Total Protein: 7.2 g/dL (ref 6.5–8.1)

## 2019-10-20 LAB — CBC
HCT: 42.9 % (ref 36.0–46.0)
Hemoglobin: 13.6 g/dL (ref 12.0–15.0)
MCH: 28.4 pg (ref 26.0–34.0)
MCHC: 31.7 g/dL (ref 30.0–36.0)
MCV: 89.6 fL (ref 80.0–100.0)
Platelets: 257 10*3/uL (ref 150–400)
RBC: 4.79 MIL/uL (ref 3.87–5.11)
RDW: 13.9 % (ref 11.5–15.5)
WBC: 7.9 10*3/uL (ref 4.0–10.5)
nRBC: 0 % (ref 0.0–0.2)

## 2019-10-20 LAB — URINALYSIS, ROUTINE W REFLEX MICROSCOPIC
Glucose, UA: 100 mg/dL — AB
Ketones, ur: NEGATIVE mg/dL
Leukocytes,Ua: NEGATIVE
Nitrite: NEGATIVE
Protein, ur: >300 mg/dL — AB
Specific Gravity, Urine: 1.025 (ref 1.005–1.030)
pH: 5.5 (ref 5.0–8.0)

## 2019-10-20 LAB — URINALYSIS, MICROSCOPIC (REFLEX)

## 2019-10-20 LAB — GLUCOSE, CAPILLARY
Glucose-Capillary: 101 mg/dL — ABNORMAL HIGH (ref 70–99)
Glucose-Capillary: 122 mg/dL — ABNORMAL HIGH (ref 70–99)

## 2019-10-20 LAB — SARS CORONAVIRUS 2 (TAT 6-24 HRS): SARS Coronavirus 2: NEGATIVE

## 2019-10-20 LAB — LIPASE, BLOOD: Lipase: 78 U/L — ABNORMAL HIGH (ref 11–51)

## 2019-10-20 MED ORDER — INSULIN ASPART 100 UNIT/ML ~~LOC~~ SOLN
0.0000 [IU] | Freq: Three times a day (TID) | SUBCUTANEOUS | Status: DC
  Administered 2019-10-21 – 2019-10-22 (×2): 2 [IU] via SUBCUTANEOUS

## 2019-10-20 MED ORDER — ACETAMINOPHEN 650 MG RE SUPP: 650.0000 mg | Freq: Four times a day (QID) | RECTAL | Status: DC | PRN

## 2019-10-20 MED ORDER — HYDRALAZINE HCL 20 MG/ML IJ SOLN: 5.0000 mg | INTRAMUSCULAR | Status: DC | PRN

## 2019-10-20 MED ORDER — ONDANSETRON HCL 4 MG/2ML IJ SOLN
4.0000 mg | Freq: Four times a day (QID) | INTRAMUSCULAR | Status: DC | PRN
  Administered 2019-10-23: 4 mg via INTRAVENOUS
  Filled 2019-10-20: qty 2

## 2019-10-20 MED ORDER — LACTATED RINGERS IV SOLN
INTRAVENOUS | Status: DC
  Administered 2019-10-21 – 2019-10-22 (×3): via INTRAVENOUS

## 2019-10-20 MED ORDER — SODIUM CHLORIDE 0.9% FLUSH: 3.0000 mL | Freq: Once | INTRAVENOUS | Status: DC

## 2019-10-20 MED ORDER — FENTANYL CITRATE (PF) 100 MCG/2ML IJ SOLN
25.0000 ug | Freq: Once | INTRAMUSCULAR | Status: DC
  Filled 2019-10-20: qty 2

## 2019-10-20 MED ORDER — INSULIN ASPART 100 UNIT/ML ~~LOC~~ SOLN: 0.0000 [IU] | Freq: Every day | SUBCUTANEOUS | Status: DC

## 2019-10-20 MED ORDER — SUCRALFATE 1 G PO TABS
1.0000 g | ORAL_TABLET | Freq: Three times a day (TID) | ORAL | Status: DC
  Administered 2019-10-20 – 2019-10-24 (×14): 1 g via ORAL
  Filled 2019-10-20 (×14): qty 1

## 2019-10-20 MED ORDER — ACETAMINOPHEN 325 MG PO TABS
650.0000 mg | ORAL_TABLET | Freq: Four times a day (QID) | ORAL | Status: DC | PRN
  Administered 2019-10-20 – 2019-10-23 (×2): 650 mg via ORAL
  Filled 2019-10-20 (×2): qty 2

## 2019-10-20 MED ORDER — PANTOPRAZOLE SODIUM 40 MG IV SOLR
40.0000 mg | Freq: Two times a day (BID) | INTRAVENOUS | Status: DC
  Administered 2019-10-20 – 2019-10-22 (×4): 40 mg via INTRAVENOUS
  Filled 2019-10-20 (×4): qty 40

## 2019-10-20 MED ORDER — PANTOPRAZOLE SODIUM 40 MG IV SOLR
40.0000 mg | Freq: Once | INTRAVENOUS | Status: AC
  Administered 2019-10-20: 40 mg via INTRAVENOUS
  Filled 2019-10-20: qty 40

## 2019-10-20 MED ORDER — ENOXAPARIN SODIUM 30 MG/0.3ML ~~LOC~~ SOLN: 30.0000 mg | SUBCUTANEOUS | Status: DC

## 2019-10-20 MED ORDER — SODIUM CHLORIDE 0.9 % IV BOLUS
1000.0000 mL | Freq: Once | INTRAVENOUS | Status: AC
  Administered 2019-10-20: 1000 mL via INTRAVENOUS

## 2019-10-20 MED ORDER — ONDANSETRON HCL 4 MG PO TABS: 4.0000 mg | ORAL_TABLET | Freq: Four times a day (QID) | ORAL | Status: DC | PRN

## 2019-10-20 MED ORDER — TRAZODONE HCL 50 MG PO TABS
50.0000 mg | ORAL_TABLET | Freq: Once | ORAL | Status: AC
  Administered 2019-10-20: 50 mg via ORAL
  Filled 2019-10-20: qty 1

## 2019-10-20 NOTE — H&P (Signed)
History and Physical    [name] DOB: 04/19/1956 DOA: 10/20/2019  PCP: Bernerd Limbo, MD Consultants: Leonie Man - neurology; GI Carlean Purl Patient coming from:  Home - lives with grandson; NOK: Daughter, [phone]  Chief Complaint: LUQ pain  HPI: [name] is a 63 y.o. female with medical history significant of CVA (hemorrhagic 12/2017); RCC s/p nephrectomy; IBS; HTN; DM; and stage 2 CKD presenting with LUQ pain.  She reports prior h/o similar, 5-6 years.  She started with abdominal pain in the LUQ and radiated into the back.  It only hurts at night.  She saw her PCP Thursday and he "put me on some horse pills."  She thinks that caused vomiting and loose bowels.  She felt dizzy yesterday AM.  The pain is really bad at night and resolves by about 8am.  She has not been able to eat/drink.  Previously, she had an EGD and was found to have 2 ulcers on her stomach; they gave her medication and it went away.  She denies taking any NSAIDs, only takes Tylenol, Tramadol, or hydrocodone, gabapentin for pain.  She had 10 blood transfusions in 2010 from GI bleeding and is definitely not having blood or dark stools now.    ED Course:  H/o gastric ulcers with prior LUQ pain - now with similar pain, worse with eating/lying flat.  Has AKI with ongoing pain.  Has not additional had any imaging.  Has not seen any blood in the stools.  Review of Systems: As per HPI; otherwise review of systems reviewed and negative.   Ambulatory Status:  Ambulates without assistance  Past Medical History:  Diagnosis Date  . Anemia   . Blood transfusion 2010   associated with GI bleeding  . CKD (chronic kidney disease) stage 2, GFR 60-89 ml/min   . Depression   . Diabetes mellitus   . Diverticulosis of colon with hemorrhage 2010, 09/2011  . H/O hiatal hernia   . Helicobacter pylori gastritis 09/13/2013  . Hx: UTI (urinary tract infection)   . Hypertension   . IBS (irritable bowel  syndrome)   . Personal history of colonic polyps    adenomas since 2010  . Renal cell carcinoma 2004   lt kidney removed  . Stroke [facility]) 12/2017    Past Surgical History:  Procedure Laterality Date  . ABDOMINAL HYSTERECTOMY    . ABDOMINAL HYSTERECTOMY    . CHOLECYSTECTOMY    . COLONOSCOPY  12/19/2008   diverticulosis  . COLONOSCOPY  11/17/2011   Procedure: COLONOSCOPY;  Surgeon: Zenovia Jarred, MD;  Location[facility];  Service: Gastroenterology;  Laterality: N/A;  . DILATION AND CURETTAGE OF UTERUS N/A 07/11/2014   Procedure: Repair of Vaginal Cuff;  Surgeon: Melina Schools, MD;  Location: [facility];  Service: Gynecology;  Laterality: N/A;  . FLEXIBLE SIGMOIDOSCOPY  12/12/2008   diverticulosis  . HAND SURGERY     x5 right  . HYDRADENITIS EXCISION Right 03/21/2014   Procedure: EXCISION HIDRADENITIS AXILLA;  Surgeon: Pedro Earls, MD;  Location: [place];  Service: General;  Laterality: Right;  . KNEE ARTHROSCOPY     x2 left  . NEPHRECTOMY     left  . TRIGGER FINGER RELEASE Left 06/23/2015   Procedure: RELEASE TRIGGER FINGER/A-1 PULLEY LEFT RING FINGER;  Surgeon: Daryll Brod, MD;  Location: [place];  Service: Orthopedics;  Laterality: Left;  . TUBAL LIGATION    . UPPER GASTROINTESTINAL ENDOSCOPY  12/12/2008   gastroporesis  Social History   Socioeconomic History  . Marital status: Divorced    Spouse name: Not on file  . Number of children: 3  . Years of education: Not on file  . Highest education level: Not on file  Occupational History  . Occupation: retired  Scientific laboratory technician  . Financial resource strain: Not on file  . Food insecurity    Worry: Not on file    Inability: Not on file  . Transportation needs    Medical: Not on file    Non-medical: Not on file  Tobacco Use  . Smoking status: Never Smoker  . Smokeless tobacco: Never Used  Substance and Sexual Activity  . Alcohol use: No    Alcohol/week: 0.0 standard drinks  . Drug use: No   . Sexual activity: Never  Lifestyle  . Physical activity    Days per week: Not on file    Minutes per session: Not on file  . Stress: Not on file  Relationships  . Social Herbalist on phone: Not on file    Gets together: Not on file    Attends religious service: Not on file    Active member of club or organization: Not on file    Attends meetings of clubs or organizations: Not on file    Relationship status: Not on file  . Intimate partner violence    Fear of current or ex partner: Not on file    Emotionally abused: Not on file    Physically abused: Not on file    Forced sexual activity: Not on file  Other Topics Concern  . Not on file  Social History Narrative   Regular exercise: walk 2 days, aerobics 3 days[facility]   Caffeine use: coffee every AM    No Known Allergies  Family History  Problem Relation Age of Onset  . Colon polyps Mother   . Diabetes Maternal Grandmother   . Breast cancer Maternal Grandmother   . Diabetes Father   . Breast cancer Daughter   . Colon cancer Neg Hx     Prior to Admission medications   Medication Sig Start Date End Date Taking? Authorizing Provider  aspirin EC 81 MG tablet Take 1 tablet (81 mg total) by mouth daily. Patient not taking: Reported on 09/09/2019 02/19/18   Frann Rider, NP  atorvastatin (LIPITOR) 40 MG tablet Take 1 tablet (40 mg total) by mouth daily at 6 PM. Patient not taking: Reported on 09/09/2019 01/06/18   Mary Sella, NP  BYDUREON 2 MG PEN 2 mg. weekly 01/19/18   [provider]  diazepam (VALIUM) 5 MG tablet Take 1 by mouth 1 hour  pre-procedure with very light food. May bring 2nd tablet to appointment. Patient not taking: Reported on 09/09/2019 09/03/18   Magnus Sinning, MD  hydrochlorothiazide (HYDRODIURIL) 25 MG tablet Take 1 tablet (25 mg total) by mouth daily. Patient not taking: Reported on 09/09/2019 01/06/18   Mary Sella, NP  lisinopril (PRINIVIL,ZESTRIL) 40 MG  tablet Take 1 tablet (40 mg total) by mouth daily. Patient not taking: Reported on 09/09/2019 08/12/16   Rosita Fire, MD  NONFORMULARY OR COMPOUNDED ITEM Transdermal therapeutics cream    [provider]  pantoprazole (PROTONIX) 40 MG tablet Take 1 tablet (40 mg total) by mouth daily. Patient not taking: Reported on 09/09/2019 08/11/16   Rosita Fire, MD  silver sulfADIAZINE (SILVADENE) 1 % cream silver sulfadiazine 1 % topical cream  APPLY TO AFFECTED AREA DAILY    [provider]  traMADol (ULTRAM) 50 MG tablet Take 1 tablet (50 mg total) by mouth every 6 (six) hours as needed. Patient not taking: Reported on 09/09/2019 04/08/19   Fredia Sorrow, MD    Physical Exam: Vitals:   10/20/19 0926 10/20/19 1230  BP: 135/74   Pulse: (!) 126 (!) 109  Resp: 18   Temp: 98.3 F (36.8 C)   TempSrc: Oral   SpO2: 97% 98%     . General:  Appears calm and comfortable and is NAD . Eyes:  PERRL, EOMI, normal lids, iris . ENT:  grossly normal hearing, lips & tongue, mmm; appropriate dentition . Neck:  no LAD, masses or thyromegaly . Cardiovascular:  RR with mild tachycardia, no m/r/g. No LE edema.  Marland Kitchen Respiratory:   CTA bilaterally with no wheezes/rales/rhonchi.  Normal respiratory effort. . Abdomen:  soft, TTP in midepigastric and LUQ regions, ND, NABS . Back:   normal alignment, no CVAT . Skin:  no rash or induration seen on limited exam . Musculoskeletal:  grossly normal tone BUE/BLE, good ROM, no bony abnormality . Psychiatric:  grossly normal mood and affect, speech fluent and appropriate, AOx3 . Neurologic:  CN 2-12 grossly intact, moves all extremities in coordinated fashion, sensation intact    Radiological Exams on Admission: No results found.  EKG: not done   Labs on Admission: I have personally reviewed the available labs and imaging studies at the time of the admission.  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 -Patient with remote h/o nephrectomy -She does not have her creatinine followed all that often and may benefit from outpatient nephrology f/u -Her baseline last June was 1.48 with GFR 44 - stage 3a/b CKD -Currently with GFR 24, likely associated with decreased PO intake the last few days due to her abdominal pain -Will observe overnight and rehydrate -If not improving, likely needs inpatient nephrology consultation -Will hold her lisinopril/HCTZ and may need to consider alternative regimens depending on her progress   Abdominal pain -She presented with similar issue in 2014; EGD showed gastritis and biopsies showed H pylori which was treated -Will order an H pylori POCT -Will start IV Protonix BID with plan to transition to PO prior to d/c -Will add Carafate -Spoke with GI - they will arrange for outpatient f/u unless failing to improve in a day or two  H/o ICH -She is supposed to be on ASA and Lipitor and needs excellent BP and glucose control -Followed by neurology  Uncontrolled DM -Her last A1c was 7.5, indicating poor control -Hold Bydureon -Will cover with moderate-scale SSI  HTN -Hold Zestoretic due to renal dysfunction -Will cover with prn IV hydralazine for now  HLD -She is supposed to be on 40 mg Lipitor daily but is apparently not taking -Encourage resumption  Obesity -BMI 44.6 -Weight loss should be encouraged -Outpatient PCP/bariatric  medicine/bariatric surgery f/u  encouraged   Note: This patient has been tested and is pending for the novel coronavirus COVID-19.  DVT prophylaxis:  SCDs Code Status:  Full - confirmed with patient/family Family Communication: Daughter was present throughout evaluation  Disposition Plan:  Home once clinically improved Consults called: GI - telephone only  Admission status: It is my clinical opinion that referral for OBSERVATION is reasonable and necessary in this patient based on the above information provided. The aforementioned taken together are felt to place the patient at high risk for further clinical deterioration. However it is anticipated that the patient may be medically stable for discharge from the hospital within 24 to 48 hours.    Karmen Bongo MD [facility]   How to contact the [facility] Attending or Consulting provider [place] or covering provider during after hours [place], for this patient?  1. Check the care team in [facility] and look for a) attending/consulting [facility] provider listed and b) the [facility] team listed 2. Log into [email or web address] and use [place]'s universal password to access. If you do not have the password, please contact the hospital operator. 3. Locate the [facility] provider you are looking for under [facility] and page to a number that you can be directly reached. 4. If you still have difficulty reaching the provider, please page the [facility] (Director on Call) for the Hospitalists listed on amion for assistance.   10/20/2019, 1:08 PM

## 2019-10-20 NOTE — ED Notes (Signed)
Ambulatory to the restroom. Once back to the room, noticed IV site bleeding and no longer inserted. Removed, bandaged.

## 2019-10-20 NOTE — ED Notes (Signed)
IV team at bedside 

## 2019-10-20 NOTE — ED Triage Notes (Signed)
LUQ pain x 6 days that is worse at night.  Reports nausea, dry heaves, and loose stools.  Seen by PCP on Thursday and started Omeprazole.

## 2019-10-20 NOTE — ED Provider Notes (Addendum)
[facility] Emergency Department Provider Note MRN:  [id number]  Arrival date & time: 10/20/19     Chief Complaint   Abdominal Pain   History of Present Illness   [name] is a 63 y.o. year-old female with a history of diabetes, CKD, gastric ulcer presenting to the ED with chief complaint of abdominal pain.  Left upper abdominal pain for the past week.  Associated with nausea.  Moderate to severe.  Worse with laying flat, worse with meals.  Has been feeling lightheaded and dehydrated due to the poor appetite.  Denies lower abdominal pain, no vaginal bleeding or discharge, no fever, no diarrhea.  Review of Systems  A complete 10 system review of systems was obtained and all systems are negative except as noted in the HPI and PMH.   Patient's Health History    Past Medical History:  Diagnosis Date  . Anemia   . Blood transfusion   . Blood transfusion without reported diagnosis   . CKD (chronic kidney disease) stage 2, GFR 60-89 ml/min   . Depression   . Diabetes mellitus   . Diverticulosis of colon with hemorrhage 2010, 09/2011  . H/O hiatal hernia   . Helicobacter pylori gastritis 09/13/2013  . Hx: UTI (urinary tract infection)   . Hypertension   . IBS (irritable bowel syndrome)   . Personal history of colonic polyps    adenomas since 2010  . Renal cell carcinoma 2004   lt kidney removed  . Stroke [facility]) 12/2017    Past Surgical History:  Procedure Laterality Date  . ABDOMINAL HYSTERECTOMY    . ABDOMINAL HYSTERECTOMY    . CHOLECYSTECTOMY    . COLONOSCOPY  12/19/2008   diverticulosis  . COLONOSCOPY  11/17/2011   Procedure: COLONOSCOPY;  Surgeon: Zenovia Jarred, MD;  [facility];  Service: Gastroenterology;  Laterality: N/A;  . DILATION AND CURETTAGE OF UTERUS N/A 07/11/2014   Procedure: Repair of Vaginal Cuff;  Surgeon: Melina Schools, MD;  Location: [facility];  Service: Gynecology;  Laterality: N/A;  . FLEXIBLE SIGMOIDOSCOPY   12/12/2008   diverticulosis  . HAND SURGERY     x5 right  . HYDRADENITIS EXCISION Right 03/21/2014   Procedure: EXCISION HIDRADENITIS AXILLA;  Surgeon: Pedro Earls, MD;  Location: [place];  Service: General;  Laterality: Right;  . KNEE ARTHROSCOPY     x2 left  . NEPHRECTOMY     left  . TRIGGER FINGER RELEASE Left 06/23/2015   Procedure: RELEASE TRIGGER FINGER/A-1 PULLEY LEFT RING FINGER;  Surgeon: Daryll Brod, MD;  Location: [place];  Service: Orthopedics;  Laterality: Left;  . TUBAL LIGATION    . UPPER GASTROINTESTINAL ENDOSCOPY  12/12/2008   gastroporesis    Family History  Problem Relation Age of Onset  . Colon polyps Mother   . Diabetes Maternal Grandmother   . Breast cancer Maternal Grandmother   . Diabetes Father   . Breast cancer Daughter   . Colon cancer Neg Hx     Social History   Socioeconomic History  . Marital status: Divorced    Spouse name: Not on file  . Number of children: 3  . Years of education: Not on file  . Highest education level: Not on file  Occupational History  . Occupation: Occupational psychologist  Social Needs  . Financial resource strain: Not on file  . Food insecurity    Worry: Not on file    Inability: Not on file  .  Transportation needs    Medical: Not on file    Non-medical: Not on file  Tobacco Use  . Smoking status: Never Smoker  . Smokeless tobacco: Never Used  Substance and Sexual Activity  . Alcohol use: No    Alcohol/week: 0.0 standard drinks  . Drug use: No  . Sexual activity: Never  Lifestyle  . Physical activity    Days per week: Not on file    Minutes per session: Not on file  . Stress: Not on file  Relationships  . Social Herbalist on phone: Not on file    Gets together: Not on file    Attends religious service: Not on file    Active member of club or organization: Not on file    Attends meetings of clubs or organizations: Not on file    Relationship status: Not on file  .  Intimate partner violence    Fear of current or ex partner: Not on file    Emotionally abused: Not on file    Physically abused: Not on file    Forced sexual activity: Not on file  Other Topics Concern  . Not on file  Social History Narrative   Regular exercise: walk 2 days, aerobics 3 days[facility]   Caffeine use: coffee every AM     Physical Exam  Vital Signs and Nursing Notes reviewed Vitals:   10/20/19 0926  BP: 135/74  Pulse: (!) 126  Resp: 18  Temp: 98.3 F (36.8 C)  SpO2: 97%    CONSTITUTIONAL: Well-appearing, NAD NEURO:  Alert and oriented x 3, no focal deficits EYES:  eyes equal and reactive ENT/NECK:  no LAD, no JVD CARDIO: Regular rate, well-perfused, normal S1 and S2 PULM:  CTAB no wheezing or rhonchi GI/GU:  normal bowel sounds, non-distended, non-tender MSK/SPINE:  No gross deformities, no edema SKIN:  no rash, atraumatic PSYCH:  Appropriate speech and behavior  Diagnostic and Interventional Summary    EKG Interpretation  Date/Time:    Ventricular Rate:    PR Interval:    QRS Duration:   QT Interval:    QTC Calculation:   R Axis:     Text Interpretation:        Labs Reviewed  LIPASE, BLOOD - Abnormal; Notable for the following components:      Result Value   Lipase 78 (*)    All other components within normal limits  COMPREHENSIVE METABOLIC PANEL - Abnormal; Notable for the following components:   Glucose, Bld 198 (*)    BUN 29 (*)    Creatinine, Ser 2.40 (*)    Albumin 3.0 (*)    GFR calc non Af Amer 21 (*)    GFR calc Af Amer 24 (*)    All other components within normal limits  URINALYSIS, ROUTINE W REFLEX MICROSCOPIC - Abnormal; Notable for the following components:   APPearance CLOUDY (*)    Glucose, UA 100 (*)    Hgb urine dipstick TRACE (*)    Bilirubin Urine SMALL (*)    Protein, ur >300 (*)    All other components within normal limits  URINALYSIS, MICROSCOPIC (REFLEX) - Abnormal; Notable for the following  components:   Bacteria, UA FEW (*)    All other components within normal limits  SARS CORONAVIRUS 2 (TAT 6-24 HRS)  CBC    No orders to display    Medications  sodium chloride flush (NS) 0.9 % injection 3 mL (has no  administration in time range)  sodium chloride 0.9 % bolus 1,000 mL (has no administration in time range)  fentaNYL (SUBLIMAZE) injection 25 mcg (has no administration in time range)  pantoprazole (PROTONIX) injection 40 mg (has no administration in time range)     Procedures  /  Critical Care Ultrasound ED Peripheral IV (Provider)  Date/Time: 10/20/2019 1:26 PM Performed by: Maudie Flakes, MD Authorized by: Maudie Flakes, MD   Procedure details:    Indications: poor IV access     Skin Prep: chlorhexidine gluconate     Location:  Right AC   Angiocath:  20 G   Bedside Ultrasound Guided: Yes     Patient tolerated procedure without complications: Yes     Dressing applied: Yes      ED Course and Medical Decision Making  I have reviewed the triage vital signs and the nursing notes.  Pertinent labs & imaging results that were available during my care of the patient were reviewed by me and considered in my medical decision making (see below for details).     Suspect recurrence of gastric ulcers as patient explains this pain feels very similar to the pain she experienced years ago.  No blood in stool, no black stool.  Abdomen is soft, with nothing on exam to suggest perforation.  Labs reveal moderate AKI.  Admitted to hospitalist service for further management.    Barth Kirks. Sedonia Small, MD [place] [email or web address]  Final Clinical Impressions(s) / ED Diagnoses     ICD-10-CM   1. Left upper quadrant abdominal pain  R10.12   2. AKI (acute kidney injury) ([place])  N17.9     ED Discharge Orders    None       Discharge Instructions Discussed with and Provided to Patient:   Discharge Instructions   None        Maudie Flakes, MD 10/20/19 1225    Maudie Flakes, MD 10/20/19 1327

## 2019-10-21 ENCOUNTER — Telehealth: Payer: Self-pay

## 2019-10-21 ENCOUNTER — Encounter (HOSPITAL_COMMUNITY): Payer: Self-pay | Admitting: General Practice

## 2019-10-21 DIAGNOSIS — E1122 Type 2 diabetes mellitus with diabetic chronic kidney disease: Secondary | ICD-10-CM | POA: Diagnosis present

## 2019-10-21 DIAGNOSIS — I10 Essential (primary) hypertension: Secondary | ICD-10-CM | POA: Diagnosis not present

## 2019-10-21 DIAGNOSIS — E1165 Type 2 diabetes mellitus with hyperglycemia: Secondary | ICD-10-CM

## 2019-10-21 DIAGNOSIS — Z87440 Personal history of urinary (tract) infections: Secondary | ICD-10-CM | POA: Diagnosis not present

## 2019-10-21 DIAGNOSIS — K2970 Gastritis, unspecified, without bleeding: Secondary | ICD-10-CM | POA: Diagnosis not present

## 2019-10-21 DIAGNOSIS — R109 Unspecified abdominal pain: Secondary | ICD-10-CM | POA: Diagnosis not present

## 2019-10-21 DIAGNOSIS — N182 Chronic kidney disease, stage 2 (mild): Secondary | ICD-10-CM

## 2019-10-21 DIAGNOSIS — R1012 Left upper quadrant pain: Secondary | ICD-10-CM | POA: Diagnosis not present

## 2019-10-21 DIAGNOSIS — K449 Diaphragmatic hernia without obstruction or gangrene: Secondary | ICD-10-CM | POA: Diagnosis present

## 2019-10-21 DIAGNOSIS — Z8719 Personal history of other diseases of the digestive system: Secondary | ICD-10-CM | POA: Diagnosis not present

## 2019-10-21 DIAGNOSIS — K2990 Gastroduodenitis, unspecified, without bleeding: Secondary | ICD-10-CM | POA: Diagnosis present

## 2019-10-21 DIAGNOSIS — Z85528 Personal history of other malignant neoplasm of kidney: Secondary | ICD-10-CM | POA: Diagnosis not present

## 2019-10-21 DIAGNOSIS — Z8711 Personal history of peptic ulcer disease: Secondary | ICD-10-CM | POA: Diagnosis not present

## 2019-10-21 DIAGNOSIS — K2900 Acute gastritis without bleeding: Secondary | ICD-10-CM | POA: Diagnosis present

## 2019-10-21 DIAGNOSIS — K219 Gastro-esophageal reflux disease without esophagitis: Secondary | ICD-10-CM | POA: Diagnosis present

## 2019-10-21 DIAGNOSIS — E1143 Type 2 diabetes mellitus with diabetic autonomic (poly)neuropathy: Secondary | ICD-10-CM | POA: Diagnosis present

## 2019-10-21 DIAGNOSIS — E785 Hyperlipidemia, unspecified: Secondary | ICD-10-CM

## 2019-10-21 DIAGNOSIS — N1831 Chronic kidney disease, stage 3a: Secondary | ICD-10-CM | POA: Diagnosis present

## 2019-10-21 DIAGNOSIS — Z833 Family history of diabetes mellitus: Secondary | ICD-10-CM | POA: Diagnosis not present

## 2019-10-21 DIAGNOSIS — Z905 Acquired absence of kidney: Secondary | ICD-10-CM | POA: Diagnosis not present

## 2019-10-21 DIAGNOSIS — Z8673 Personal history of transient ischemic attack (TIA), and cerebral infarction without residual deficits: Secondary | ICD-10-CM | POA: Diagnosis not present

## 2019-10-21 DIAGNOSIS — K3184 Gastroparesis: Secondary | ICD-10-CM | POA: Diagnosis present

## 2019-10-21 DIAGNOSIS — E11319 Type 2 diabetes mellitus with unspecified diabetic retinopathy without macular edema: Secondary | ICD-10-CM

## 2019-10-21 DIAGNOSIS — K5790 Diverticulosis of intestine, part unspecified, without perforation or abscess without bleeding: Secondary | ICD-10-CM | POA: Diagnosis present

## 2019-10-21 DIAGNOSIS — I610 Nontraumatic intracerebral hemorrhage in hemisphere, subcortical: Secondary | ICD-10-CM

## 2019-10-21 DIAGNOSIS — Z20828 Contact with and (suspected) exposure to other viral communicable diseases: Secondary | ICD-10-CM | POA: Diagnosis present

## 2019-10-21 DIAGNOSIS — Z79899 Other long term (current) drug therapy: Secondary | ICD-10-CM | POA: Diagnosis not present

## 2019-10-21 DIAGNOSIS — Z6841 Body Mass Index (BMI) 40.0 and over, adult: Secondary | ICD-10-CM | POA: Diagnosis not present

## 2019-10-21 DIAGNOSIS — N179 Acute kidney failure, unspecified: Secondary | ICD-10-CM | POA: Diagnosis present

## 2019-10-21 DIAGNOSIS — E860 Dehydration: Secondary | ICD-10-CM | POA: Diagnosis present

## 2019-10-21 LAB — CBC
HCT: 35.9 % — ABNORMAL LOW (ref 36.0–46.0)
Hemoglobin: 11.5 g/dL — ABNORMAL LOW (ref 12.0–15.0)
MCH: 28.4 pg (ref 26.0–34.0)
MCHC: 32 g/dL (ref 30.0–36.0)
MCV: 88.6 fL (ref 80.0–100.0)
Platelets: 203 10*3/uL (ref 150–400)
RBC: 4.05 MIL/uL (ref 3.87–5.11)
RDW: 14 % (ref 11.5–15.5)
WBC: 7.2 10*3/uL (ref 4.0–10.5)
nRBC: 0 % (ref 0.0–0.2)

## 2019-10-21 LAB — BASIC METABOLIC PANEL
Anion gap: 12 (ref 5–15)
BUN: 26 mg/dL — ABNORMAL HIGH (ref 8–23)
CO2: 23 mmol/L (ref 22–32)
Calcium: 8.5 mg/dL — ABNORMAL LOW (ref 8.9–10.3)
Chloride: 102 mmol/L (ref 98–111)
Creatinine, Ser: 1.85 mg/dL — ABNORMAL HIGH (ref 0.44–1.00)
GFR calc Af Amer: 33 mL/min — ABNORMAL LOW (ref >60)
GFR calc non Af Amer: 29 mL/min — ABNORMAL LOW (ref >60)
Glucose, Bld: 133 mg/dL — ABNORMAL HIGH (ref 70–99)
Potassium: 3.6 mmol/L (ref 3.5–5.1)
Sodium: 137 mmol/L (ref 135–145)

## 2019-10-21 LAB — GLUCOSE, CAPILLARY
Glucose-Capillary: 96 mg/dL (ref 70–99)
Glucose-Capillary: 113 mg/dL — ABNORMAL HIGH (ref 70–99)
Glucose-Capillary: 150 mg/dL — ABNORMAL HIGH (ref 70–99)
Glucose-Capillary: 149 mg/dL — ABNORMAL HIGH (ref 70–99)

## 2019-10-21 LAB — HIV ANTIBODY (ROUTINE TESTING W REFLEX): HIV Screen 4th Generation wRfx: NONREACTIVE

## 2019-10-21 NOTE — Telephone Encounter (Signed)
ROV 11/11/19 at 2 pm with Amy Esterwood.  Pt has been sent the appt information via My Chart.  She will also be provided with appt info at discharge.

## 2019-10-21 NOTE — Plan of Care (Signed)
  Problem: Education: Goal: Knowledge of General Education information will improve Description Including pain rating scale, medication(s)/side effects and non-pharmacologic comfort measures Outcome: Progressing   

## 2019-10-21 NOTE — Telephone Encounter (Signed)
-----   Message from Loralie Champagne, PA-C sent at 10/20/2019  1:12 PM EST ----- Please make the patient an hospital follow-up appointment with Dr. Carlean Purl or Amy if possible in the next 2 weeks for epigastric pain.  She is being admitted for acute kidney injury and they are putting her on twice daily PPI and Carafate.  They are not impressed by her exam and her complaint of pain and so asked [place] to follow-up as an outpatient.  Has history of gastritis.  Patient will need to be contacted with appointment.  Thank you,  Jess

## 2019-10-21 NOTE — Progress Notes (Signed)
PROGRESS NOTE  [name]  [id number] DOB: 1956-02-18 DOA: 10/20/2019 PCP: Bernerd Limbo, MD  Outpatient Specialists: [facility] Brief Narrative: [name] is a 63 y.o. female with a history of hemorrhagic CVA 2019, RCC s/p nephrectomy, HTN, T2DM, IBS, stage II CKD and history of H. pylori gastritis which was treated who presented to the ED with LUQ abdominal pain radiating to the back worse at night and with food, so she hadn't eaten anything for 1 week. This seemed similar to prior gastritis, though she had a history of GI bleeding, denied hematochezia, melena, or anemic symptoms. In the ED she demonstrated a benign abdominal exam, though had AKI and was admitted.   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  - Continue PPI and carafate - Avoid NSAIDs - Monitor symptoms. - We have secured GI follow up as outpatient. If unable to advance diet, would need inpatient evaluation.  Dehydration:  - PO tolerance is improving, though remains minimal today, will keep inpatient, advance to soft diet and encourage po intake.   AKI on stage II CKD in pt w/acquired solitary kidney s/p nephrectomy for RCC: Unclear what baseline creatinine is exactly. Improving with IVF, though creatinine remains significantly elevated from previous.  - Continue IVF today and recheck renal function in AM.   Uncontrolled T2DM: HbA1c 7.5%.  - Continue moderate SSI  History of ICH:  - Continue ASA, statin and neurology follow up as outpatient.   Obesity: BMI 44,  noted.   DVT prophylaxis: SCDs Code Status: Full Family Communication: None at bedside Disposition Plan: Home once clinically improved  Consultants:   [facility] by phone  Procedures:   None  Antimicrobials:  None   Subjective: Abdominal pain is about the same, but willing to try to advance diet. No nausea or vomiting. No melena/hematochezia or other bleeding noted.   Objective: Vitals:   10/20/19 1623 10/20/19 2039 10/21/19 0601 10/21/19 1546  BP: (!) 154/69 (!) 115/48 (!) 120/58 (!) 168/73  Pulse: 88 85 (!) 108 86  Resp: 16 18 18 18   Temp: 98.9 F (37.2 C) 98.2 F (36.8 C) 97.6 F (36.4 C) 98.5 F (36.9 C)  TempSrc: Oral Oral Oral Oral  SpO2: 99% 100% 99% 100%    Intake/Output Summary (Last 24 hours) at 10/21/2019 1635 Last data filed at 10/21/2019 1549 Gross per 24 hour  Intake 2567.5 ml  Output 1400 ml  Net 1167.5 ml   Gen: 63 y.o. female in no distress Pulm: Non-labored breathing room air. Clear to auscultation bilaterally.  CV: Regular rate and rhythm. No murmur, rub, or gallop. No JVD, no pedal edema. GI: Abdomen soft, not actually tender to palpation, non-distended, with normoactive bowel sounds. No organomegaly or masses felt. Ext: Warm, no deformities Skin: No rashes, lesions or ulcers Neuro: Alert and oriented. No focal neurological deficits. Psych: Judgement and insight appear normal. Mood & affect appropriate.   Data Reviewed: I have personally reviewed following labs and imaging studies  CBC: Recent Labs  Lab 10/20/19 0955 10/21/19 0256  WBC 7.9 7.2  HGB 13.6 11.5*  HCT 42.9 35.9*  MCV 89.6 88.6  PLT 257 [id number]   Basic Metabolic Panel: Recent Labs  Lab 10/20/19 0955 10/21/19 0256  NA 136 137  K 3.6 3.6  CL 101 102  CO2 22 23  GLUCOSE 198* 133*  BUN 29* 26*  CREATININE 2.40* 1.85*  CALCIUM 9.3 8.5*   GFR: CrCl cannot be calculated (Unknown ideal weight.). Liver Function Tests: Recent Labs  Lab 10/20/19 0955  AST 18  ALT 18   ALKPHOS 86  BILITOT 0.4  PROT 7.2  ALBUMIN 3.0*   Recent Labs  Lab 10/20/19 0955  LIPASE 78*   No results for input(s): AMMONIA in the last 168 hours. Coagulation Profile: No results for input(s): INR, PROTIME in the last 168 hours. Cardiac Enzymes: No results for input(s): CKTOTAL, CKMB, CKMBINDEX, TROPONINI in the last 168 hours. BNP (last 3 results) No results for input(s): PROBNP in the last 8760 hours. HbA1C: No results for input(s): HGBA1C in the last 72 hours. CBG: Recent Labs  Lab 10/20/19 1623 10/20/19 2121 10/21/19 0748 10/21/19 1151  GLUCAP 101* 122* 96 113*   Lipid Profile: No results for input(s): CHOL, HDL, LDLCALC, TRIG, CHOLHDL, LDLDIRECT in the last 72 hours. Thyroid Function Tests: No results for input(s): TSH, T4TOTAL, FREET4, T3FREE, THYROIDAB in the last 72 hours. Anemia Panel: No results for input(s): VITAMINB12, FOLATE, FERRITIN, TIBC, IRON, RETICCTPCT in the last 72 hours. Urine analysis:    Component Value Date/Time   COLORURINE YELLOW 10/20/2019 0940   APPEARANCEUR CLOUDY (A) 10/20/2019 0940   LABSPEC 1.025 10/20/2019 0940   PHURINE 5.5 10/20/2019 0940   GLUCOSEU 100 (A) 10/20/2019 0940   GLUCOSEU NEGATIVE 07/28/2009 1005   HGBUR TRACE (A) 10/20/2019 0940   BILIRUBINUR SMALL (A) 10/20/2019 0940   BILIRUBINUR  08/20/2013 1147     Comment:     Limited sample, not performed   KETONESUR NEGATIVE 10/20/2019 0940   PROTEINUR >300 (A) 10/20/2019 0940   UROBILINOGEN 0.2 03/01/2014 1303   NITRITE NEGATIVE 10/20/2019 0940   LEUKOCYTESUR NEGATIVE 10/20/2019 0940   Recent Results (from the past 240 hour(s))  SARS CORONAVIRUS 2 (TAT 6-24 HRS) Nasopharyngeal Nasopharyngeal Swab     Status: None   Collection Time: 10/20/19 11:40 AM   Specimen: Nasopharyngeal Swab  Result Value Ref Range Status   SARS Coronavirus 2 NEGATIVE NEGATIVE Final    Comment: (NOTE) SARS-CoV-2 target nucleic acids are NOT DETECTED. The SARS-CoV-2 RNA is generally  detectable in upper and lower respiratory specimens during the acute phase of infection. Negative results do not preclude SARS-CoV-2 infection, do not rule out co-infections with other pathogens, and should not be used as the sole basis for treatment or other patient management decisions. Negative results must be combined with clinical observations, patient history, and epidemiological information. The expected result is Negative. Fact Sheet for Patients: SugarRoll.be Fact Sheet for Healthcare Providers: https://www.woods-mathews.com/ This test is not yet approved or cleared by the [place] FDA and  has been authorized for detection and/or diagnosis of SARS-CoV-2 by FDA under an Emergency Use Authorization (EUA). This EUA will remain  in effect (meaning this test can be used) for the duration of the COVID-19 declaration under Section 56 4(b)(1) of the Act, 21 U.S.C. section 360bbb-3(b)(1), unless the authorization is terminated or revoked sooner. Performed at [facility], [place] [address] [place], [place] [address]       Radiology Studies: No results found.  Scheduled Meds: . insulin aspart  0-15 Units Subcutaneous TID  WC  . insulin aspart  0-5 Units Subcutaneous QHS  . pantoprazole (PROTONIX) IV  40 mg Intravenous Q12H  . sucralfate  1 g Oral TID WC & HS   Continuous Infusions: . lactated ringers 75 mL/hr at 10/20/19 1610     LOS: 0 days   Time spent: 25 minutes.  Patrecia Pour, MD [facility] [email or web address] 10/21/2019, 4:35 PM

## 2019-10-22 DIAGNOSIS — Z8719 Personal history of other diseases of the digestive system: Secondary | ICD-10-CM

## 2019-10-22 DIAGNOSIS — R1012 Left upper quadrant pain: Secondary | ICD-10-CM

## 2019-10-22 LAB — COMPREHENSIVE METABOLIC PANEL
ALT: 16 U/L (ref 0–44)
AST: 29 U/L (ref 15–41)
Albumin: 2.4 g/dL — ABNORMAL LOW (ref 3.5–5.0)
Alkaline Phosphatase: 64 U/L (ref 38–126)
Anion gap: 11 (ref 5–15)
BUN: 17 mg/dL (ref 8–23)
CO2: 25 mmol/L (ref 22–32)
Calcium: 8.8 mg/dL — ABNORMAL LOW (ref 8.9–10.3)
Chloride: 103 mmol/L (ref 98–111)
Creatinine, Ser: 1.5 mg/dL — ABNORMAL HIGH (ref 0.44–1.00)
GFR calc Af Amer: 43 mL/min — ABNORMAL LOW (ref >60)
GFR calc non Af Amer: 37 mL/min — ABNORMAL LOW (ref >60)
Glucose, Bld: 142 mg/dL — ABNORMAL HIGH (ref 70–99)
Potassium: 3.7 mmol/L (ref 3.5–5.1)
Sodium: 139 mmol/L (ref 135–145)
Total Bilirubin: 0.4 mg/dL (ref 0.3–1.2)
Total Protein: 5.9 g/dL — ABNORMAL LOW (ref 6.5–8.1)

## 2019-10-22 LAB — GLUCOSE, CAPILLARY
Glucose-Capillary: 132 mg/dL — ABNORMAL HIGH (ref 70–99)
Glucose-Capillary: 96 mg/dL (ref 70–99)
Glucose-Capillary: 103 mg/dL — ABNORMAL HIGH (ref 70–99)
Glucose-Capillary: 113 mg/dL — ABNORMAL HIGH (ref 70–99)

## 2019-10-22 LAB — CBC
HCT: 35.5 % — ABNORMAL LOW (ref 36.0–46.0)
Hemoglobin: 11.4 g/dL — ABNORMAL LOW (ref 12.0–15.0)
MCH: 28.3 pg (ref 26.0–34.0)
MCHC: 32.1 g/dL (ref 30.0–36.0)
MCV: 88.1 fL (ref 80.0–100.0)
Platelets: 204 10*3/uL (ref 150–400)
RBC: 4.03 MIL/uL (ref 3.87–5.11)
RDW: 13.7 % (ref 11.5–15.5)
WBC: 5.9 10*3/uL (ref 4.0–10.5)
nRBC: 0 % (ref 0.0–0.2)

## 2019-10-22 MED ORDER — PANTOPRAZOLE SODIUM 40 MG PO TBEC
40.0000 mg | DELAYED_RELEASE_TABLET | Freq: Every day | ORAL | Status: DC
  Administered 2019-10-23: 40 mg via ORAL
  Filled 2019-10-22: qty 1

## 2019-10-22 NOTE — Progress Notes (Signed)
PROGRESS NOTE  [name]  [id number] DOB: September 08, 1956 DOA: 10/20/2019 PCP: Bernerd Limbo, MD  Outpatient Specialists: [facility] Brief Narrative: [name] is a 63 y.o. female with a history of hemorrhagic CVA 2019, RCC s/p nephrectomy, HTN, T2DM, IBS, stage II CKD and history of H. pylori gastritis which was treated who presented to the ED with LUQ abdominal pain radiating to the back worse at night and with food, so she hadn't eaten anything for 1 week. This seemed similar to prior gastritis, though she had a history of GI bleeding, denied hematochezia, melena, or anemic symptoms. In the ED she demonstrated a benign abdominal exam, though had AKI and was admitted.   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  - Continue PPI and carafate - Avoid NSAIDs - We have secured GI follow up as outpatient. Unfortunately pain continues to limit per oral intake, not improved with Tx as above. Will consult GI for further evaluation.   Dehydration:  -  I do not believe her current per oral intake is adequate to allow discharge,  AKI on stage II CKD in pt w/acquired solitary kidney s/p nephrectomy for RCC: Unclear what baseline creatinine is exactly. Improving with IVF, though creatinine remains significantly elevated from prior values.  - Continue IVF again today. Monitoring renal function in AM.   Uncontrolled T2DM: HbA1c 7.5%.  - Continue moderate SSI. At inpatient goal.  History of ICH:  - Continue ASA, statin and  neurology follow up as outpatient.   Obesity: BMI 44, noted.   DVT prophylaxis: SCDs Code Status: Full Family Communication: None at bedside Disposition Plan: Home once clinically improved, evaluated by GI. Will continue IVF for another day.  Consultants:    GI  Procedures:   None  Antimicrobials:  None   Subjective: Abdominal pain comes and goes, still moderate-severe, has not improved overall since admission, does go completely away at times, worse with po intake, radiates to the back. Has not eaten very much, attempted english muffin this AM. Denies bleeding.    Objective: Vitals:   10/21/19 0601 10/21/19 1546 10/21/19 2210 10/22/19 0512  BP: (!) 120/58 (!) 168/73 140/72 (!) 158/80  Pulse: (!) 108 86 83 88  Resp: 18 18 18 14   Temp: 97.6 F (36.4 C) 98.5 F (36.9 C) 98.7 F (37.1 C) 98.5 F (36.9 C)  TempSrc: Oral Oral Oral Oral  SpO2: 99% 100% 99% 99%    Intake/Output Summary (Last 24 hours) at 10/22/2019 0941 Last data filed at 10/22/2019 0321 Gross per 24 hour  Intake 1967.5 ml  Output 2475 ml  Net -507.5 ml   Gen: 63 y.o. female in no distress Pulm: Nonlabored breathing room air. Clear. CV: Regular rate and rhythm. No murmur, rub, or gallop. No JVD, no dependent edema. GI: Abdomen soft, tender in LUQ with some guarding, non-distended, with normoactive bowel sounds.  Ext: Warm, no deformities Skin: No rashes, lesions or ulcers on visualized skin. Neuro: Alert and oriented. No focal neurological deficits. Psych: Judgement and insight appear fair. Mood euthymic & affect  congruent. Behavior is appropriate.    Data Reviewed: I have personally reviewed following labs and imaging studies  CBC: Recent Labs  Lab 10/20/19 0955 10/21/19 0256 10/22/19 0338  WBC 7.9 7.2 5.9  HGB 13.6 11.5* 11.4*  HCT 42.9 35.9* 35.5*  MCV 89.6 88.6 88.1  PLT 257 203 0000000   Basic Metabolic Panel: Recent Labs  Lab 10/20/19 0955 10/21/19 0256 10/22/19 0338  NA 136  137 139  K 3.6 3.6 3.7  CL 101 102 103  CO2 22 23 25   GLUCOSE 198* 133* 142*  BUN 29* 26* 17  CREATININE 2.40* 1.85* 1.50*  CALCIUM 9.3 8.5* 8.8*   GFR: CrCl cannot be calculated (Unknown ideal weight.). Liver Function Tests: Recent Labs  Lab 10/20/19 0955 10/22/19 0338  AST 18 29  ALT 18 16  ALKPHOS 86 64  BILITOT 0.4 0.4  PROT 7.2 5.9*  ALBUMIN 3.0* 2.4*   Recent Labs  Lab 10/20/19 0955  LIPASE 78*   No results for input(s): AMMONIA in the last 168 hours. Coagulation Profile: No results for input(s): INR, PROTIME in the last 168 hours. Cardiac Enzymes: No results for input(s): CKTOTAL, CKMB, CKMBINDEX, TROPONINI in the last 168 hours. BNP (last 3 results) No results for input(s): PROBNP in the last 8760 hours. HbA1C: No results for input(s): HGBA1C in the last 72 hours. CBG: Recent Labs  Lab 10/21/19 0748 10/21/19 1151 10/21/19 1651 10/21/19 2154 10/22/19 0823  GLUCAP 96 113* 150* 149* 132*   Lipid Profile: No results for input(s): CHOL, HDL, LDLCALC, TRIG, CHOLHDL, LDLDIRECT in the last 72 hours. Thyroid Function Tests: No results for input(s): TSH, T4TOTAL, FREET4, T3FREE, THYROIDAB in the last 72 hours. Anemia Panel: No results for input(s): VITAMINB12, FOLATE, FERRITIN, TIBC, IRON, RETICCTPCT in the last 72 hours. Urine analysis:    Component Value Date/Time   COLORURINE YELLOW 10/20/2019 0940   APPEARANCEUR CLOUDY (A) 10/20/2019 0940   LABSPEC 1.025 10/20/2019 0940   PHURINE 5.5 10/20/2019 0940   GLUCOSEU 100 (A) 10/20/2019 0940   GLUCOSEU NEGATIVE 07/28/2009 1005   HGBUR TRACE (A) 10/20/2019 0940   BILIRUBINUR SMALL (A) 10/20/2019 0940   BILIRUBINUR  08/20/2013 1147     Comment:     Limited sample, not performed   KETONESUR NEGATIVE 10/20/2019 0940   PROTEINUR >300 (A) 10/20/2019 0940   UROBILINOGEN 0.2 03/01/2014 1303   NITRITE NEGATIVE 10/20/2019 0940   LEUKOCYTESUR NEGATIVE 10/20/2019 0940   Recent Results (from the past 240 hour(s))   SARS CORONAVIRUS 2 (TAT 6-24 HRS) Nasopharyngeal Nasopharyngeal Swab     Status: None   Collection Time: 10/20/19 11:40 AM   Specimen: Nasopharyngeal Swab  Result Value Ref Range Status   SARS Coronavirus 2 NEGATIVE NEGATIVE Final    Comment: (NOTE) SARS-CoV-2 target nucleic acids are NOT DETECTED. The SARS-CoV-2 RNA is generally detectable in upper and lower respiratory specimens during the acute phase of infection. Negative results do not preclude SARS-CoV-2 infection, do not rule out co-infections with other pathogens, and should not be used as the sole basis for treatment or other patient management decisions. Negative results must be combined with clinical observations, patient history, and epidemiological information. The expected result is Negative. Fact Sheet for Patients: SugarRoll.be Fact Sheet for Healthcare Providers: https://www.woods-mathews.com/ This test is not yet approved or cleared by the [place] FDA and  has been authorized for detection and/or diagnosis of SARS-CoV-2 by FDA under an Emergency Use Authorization (EUA). This EUA will remain  in effect (meaning this  test can be used) for the duration of the COVID-19 declaration under Section 56 4(b)(1) of the Act, 21 U.S.C. section 360bbb-3(b)(1), unless the authorization is terminated or revoked sooner. Performed at [facility], [place] [address] [place], [place] [address]       Radiology Studies: No results found.  Scheduled Meds: . insulin aspart  0-15 Units Subcutaneous TID WC  . insulin aspart  0-5 Units Subcutaneous QHS  . pantoprazole (PROTONIX) IV  40 mg Intravenous Q12H  . sucralfate  1 g Oral TID WC & HS   Continuous Infusions: . lactated ringers 75 mL/hr at 10/22/19 0521     LOS: 1 day   Time spent: 35 minutes.  Patrecia Pour, MD [facility] [email or web address] 10/22/2019, 9:41 AM

## 2019-10-22 NOTE — Consult Note (Addendum)
[facility] Consult: 9:59 AM 10/22/2019  LOS: 1 day    Referring Provider: Dr Vance Gather  Primary Care Physician:  Bernerd Limbo, MD Primary Gastroenterologist:  Dr. Carlean Purl  Patient coming from:  Home - lives with grandson; [facility]: Daughter, [phone]   Reason for Consultation:  Abdominal pain.     HPI: [name] is a 63 y.o. female.  PMH hemorrhagic CVA 2019.  Renal cell cancer, L nephrectomy 2004.  DM 2, on GLP-1 antagonist injectable.  CKD 2.  DM. IBS.  2007 cholecystectomy (path: cholelithiasis).  Several prior EGDs, colonoscopies, flexible sigmoidoscopies.  Transfusion requiring diverticular bleeds 11/2008, 12/2008.  Last GI OV w Dr Carlean Purl was 10/2015 for c/o abdominal pain.  A1c of 12.1 then, Dr Arelia Longest felt her main problem was uncontrolled diabetes and IBS.  He prescribed dicyclomine.  Has been treated w Reglan in past.   01/2003, 11/2008 EGDs for nausea, vomiting, abdominal pain.  Fluid and food debris in stomach c/w diabetic gastroparesis. O/w normal studies.  11/2008 colonoscopy: Diverticulosis.  7 mm sigmoid pedunculated (adenomatous) polyp. 12/2008 Colonoscopy for hematochezia and positive bleeding scan.  Pan diverticulosis, active bleeding tic and hepatic flexure.    11/2011 colonoscopy for hematochezia, melena.  Blood in the ascending colon, rectum. Diverticulosis.  Sigmoid colon polyp not removed due to recent significant bleeding. 08/2013 EGD for evaluation of left upper quadrant abdominal pain.  Mild antral gastritis, otherwise normal study.  Pathology positive for H. Pylori which was treated and stool H pylori negative 07/2013.   02/2013 colonoscopy for polyp surveillance (adenomatous polyp 11/2008), hematochezia.  Two, 5 to 8 mm sized, sessile polyps.  Severe left sided diverticulosis, moderate  right sided diverticulosis.  Small, nonbleeding internal hemorrhoids.  Pathology: Inflammatory polyp.  Plan repeat colonoscopy 2024.  A1c of 7.5 in 07/2019.   For the past several years patient has had no significant adverse GI symptoms, no nausea/vomiting, no heartburn, no dysphagia, no blood in her stool or altered bowel habits, no anorexia. About a week ago the patient developed pain in her left upper quadrant which radiated around to and through into the rear left flank.  This is similar to pain she has had in the past, several years ago.  The pain starts when she lays down at night to go to bed and eases off after she gets up in the morning and is in a seated or standing position. On Thursday PMD started her on omeprazole 40 mg/day.  Eventually she developed nausea, dry heaves and lost her appetite.  She also developed loose, nonbloody stools after starting the medication.  3 to 4 weeks ago she took prednisone for 3 days to address painful swelling in her ankle and it resolved. The left upper quadrant pain and accompanying symptoms began a day after her neurologist prescribed topical NSAID cream for leg pain.  She was using this 3 times a day and wondered whether or not she had absorbed some of the NSAID.Marland Kitchen   Lipase 78.  LFTs normal.  WBCs normal.   Has not had xrays or advance  GI imaging thus far.   Fitment thus far is Protonix 40 IV twice daily, Carafate WC/at bedtime  The pain persists but the nausea has subsided, she is tolerating solid food.  Stool was loose and nonbloody once yesterday.       Past Medical History:  Diagnosis Date  . Anemia   . Blood transfusion 2010   associated with GI bleeding  . CKD (chronic kidney disease) stage 2, GFR 60-89 ml/min   . Depression   . Diabetes mellitus   . Diverticulosis of colon with hemorrhage 2010, 09/2011  . H/O hiatal hernia   . Helicobacter pylori gastritis 09/13/2013  . Hx: UTI (urinary tract infection)   . Hypertension   . IBS  (irritable bowel syndrome)   . Personal history of colonic polyps    adenomas since 2010  . Renal cell carcinoma 2004   lt kidney removed  . Stroke [facility]) 12/2017    Past Surgical History:  Procedure Laterality Date  . ABDOMINAL HYSTERECTOMY    . ABDOMINAL HYSTERECTOMY    . CHOLECYSTECTOMY    . COLONOSCOPY  12/19/2008   diverticulosis  . COLONOSCOPY  11/17/2011   Procedure: COLONOSCOPY;  Surgeon: Zenovia Jarred, MD;  Location: [facility];  Service: Gastroenterology;  Laterality: N/A;  . DILATION AND CURETTAGE OF UTERUS N/A 07/11/2014   Procedure: Repair of Vaginal Cuff;  Surgeon: Melina Schools, MD;  Location: [facility];  Service: Gynecology;  Laterality: N/A;  . FLEXIBLE SIGMOIDOSCOPY  12/12/2008   diverticulosis  . HAND SURGERY     x5 right  . HYDRADENITIS EXCISION Right 03/21/2014   Procedure: EXCISION HIDRADENITIS AXILLA;  Surgeon: Pedro Earls, MD;  Location: [facility];  Service: General;  Laterality: Right;  . KNEE ARTHROSCOPY     x2 left  . NEPHRECTOMY     left  . TRIGGER FINGER RELEASE Left 06/23/2015   Procedure: RELEASE TRIGGER FINGER/A-1 PULLEY LEFT RING FINGER;  Surgeon: Daryll Brod, MD;  Location: [facility];  Service: Orthopedics;  Laterality: Left;  . TUBAL LIGATION    . UPPER GASTROINTESTINAL ENDOSCOPY  12/12/2008   gastroporesis    Prior to Admission medications   Medication Sig Start Date End Date Taking? Authorizing Provider  BYDUREON 2 MG PEN Inject 2 mg into the skin every 7 (seven) days. Weekly- Monday 01/19/18  Yes [provider]  lisinopril-hydrochlorothiazide (ZESTORETIC) 20-12.5 MG tablet Take 2 tablets by mouth daily.   Yes [provider]  NONFORMULARY OR COMPOUNDED ITEM Transdermal therapeutics cream   Yes [provider]  omeprazole (PRILOSEC) 40 MG capsule Take 40 mg by mouth daily.   Yes [provider]  silver sulfADIAZINE (SILVADENE) 1 % cream silver sulfadiazine 1 % topical cream  APPLY  TO AFFECTED AREA DAILY    [provider]    Scheduled Meds: . insulin aspart  0-15 Units Subcutaneous TID WC  . insulin aspart  0-5 Units Subcutaneous QHS  . pantoprazole (PROTONIX) IV  40 mg Intravenous Q12H  . sucralfate  1 g Oral TID WC & HS   Infusions: . lactated ringers 75 mL/hr at 10/22/19 0521   PRN Meds: acetaminophen **OR** acetaminophen, hydrALAZINE, ondansetron **OR** ondansetron (ZOFRAN) IV   Allergies as of 10/20/2019  . (No Known Allergies)    Family History  Problem Relation Age of Onset  . Colon polyps Mother   . Diabetes Maternal Grandmother   . Breast cancer Maternal Grandmother   . Diabetes Father   . Breast  cancer Daughter   . Colon cancer Neg Hx     Social History   Socioeconomic History  . Marital status: Divorced    Spouse name: Not on file  . Number of children: 3  . Years of education: Not on file  . Highest education level: Not on file  Occupational History  . Occupation: retired  Scientific laboratory technician  . Financial resource strain: Not on file  . Food insecurity    Worry: Not on file    Inability: Not on file  . Transportation needs    Medical: Not on file    Non-medical: Not on file  Tobacco Use  . Smoking status: Never Smoker  . Smokeless tobacco: Never Used  Substance and Sexual Activity  . Alcohol use: No    Alcohol/week: 0.0 standard drinks  . Drug use: No  . Sexual activity: Never  Lifestyle  . Physical activity    Days per week: Not on file    Minutes per session: Not on file  . Stress: Not on file  Relationships  . Social Herbalist on phone: Not on file    Gets together: Not on file    Attends religious service: Not on file    Active member of club or organization: Not on file    Attends meetings of clubs or organizations: Not on file    Relationship status: Not on file  . Intimate partner violence    Fear of current or ex partner: Not on file    Emotionally abused: Not on file    Physically abused:  Not on file    Forced sexual activity: Not on file  Other Topics Concern  . Not on file  Social History Narrative   Regular exercise: walk 2 days, aerobics 3 days[facility]   Caffeine use: coffee every AM    REVIEW OF SYSTEMS: Constitutional: No weakness, no dizziness.  Normally able to pursue normal daily activities which involve parenting of her 34 year old grandson, his mother died a few years ago at the age of 62 from breast cancer. ENT:  No nose bleeds Pulm: No shortness of breath.  No cough. CV:  No palpitations.  Right ankle edema, pain resolved.   GU:  No hematuria, no frequency GI: No dysphagia.  See HPI. Heme: No unusual or aggressive bleeding or bruising. Transfusions: In 2010 and 2013. Neuro:  No headaches, no peripheral tingling or numbness Derm:  No itching, no rash or sores.  Endocrine:  No sweats or chills.  No polyuria or dysuria Immunization: Not queried. Travel:  None beyond local counties in last few months.    PHYSICAL EXAM: Vital signs in last 24 hours: Vitals:   10/21/19 2210 10/22/19 0512  BP: 140/72 (!) 158/80  Pulse: 83 88  Resp: 18 14  Temp: 98.7 F (37.1 C) 98.5 F (36.9 C)  SpO2: 99% 99%   Wt Readings from Last 3 Encounters:  09/09/19 107.1 kg  04/08/19 102.1 kg  01/07/19 105.1 kg    General: Pleasant, obese, comfortable, alert, nonill appearing Head: No facial asymmetry or swelling.  No signs of head trauma. Eyes: No scleral icterus, no conjunctival pallor.  EOMI. Ears: Not hard of hearing Nose: No congestion, no discharge. Mouth: Oropharynx moist, pink, clear. Neck: No JVD, no masses, no thyromegaly. Lungs: Clear bilaterally with good breath sounds.  No dyspnea, no cough. Heart: RRR.  No MRG.  S1, S2 present. Abdomen: Soft.  Minor tenderness in  the left upper quadrant without guarding or rebound.  Left-sided CVA tenderness to moderate intensity percussion.  No HSM, masses, bruits, hernias..   Rectal: Deferred  Musc/Skeltl: No joint redness or gross deformities. Extremities: No CCE. Neurologic: Does not, alert.  Oriented x3.  Moves all 4 limbs, strength not tested.  No tremors or involuntary movements. Skin: No rashes, no sores. Nodes: No cervical adenopathy. Psych: Pleasant, calm, cooperative, fluid speech.  Good historian.  Intake/Output from previous day: 12/07 0701 - 12/08 0700 In: 2327.5 [P.O.:600; I.V.:1727.5] Out: 2675 [Urine:2675] Intake/Output this shift: No intake/output data recorded.  LAB RESULTS: Recent Labs    10/20/19 0955 10/21/19 0256 10/22/19 0338  WBC 7.9 7.2 5.9  HGB 13.6 11.5* 11.4*  HCT 42.9 35.9* 35.5*  PLT 257 203 204   BMET Lab Results  Component Value Date   NA 139 10/22/2019   NA 137 10/21/2019   NA 136 10/20/2019   K 3.7 10/22/2019   K 3.6 10/21/2019   K 3.6 10/20/2019   CL 103 10/22/2019   CL 102 10/21/2019   CL 101 10/20/2019   CO2 25 10/22/2019   CO2 23 10/21/2019   CO2 22 10/20/2019   GLUCOSE 142 (H) 10/22/2019   GLUCOSE 133 (H) 10/21/2019   GLUCOSE 198 (H) 10/20/2019   BUN 17 10/22/2019   BUN 26 (H) 10/21/2019   BUN 29 (H) 10/20/2019   CREATININE 1.50 (H) 10/22/2019   CREATININE 1.85 (H) 10/21/2019   CREATININE 2.40 (H) 10/20/2019   CALCIUM 8.8 (L) 10/22/2019   CALCIUM 8.5 (L) 10/21/2019   CALCIUM 9.3 10/20/2019   LFT Recent Labs    10/20/19 0955 10/22/19 0338  PROT 7.2 5.9*  ALBUMIN 3.0* 2.4*  AST 18 29  ALT 18 16  ALKPHOS 86 64  BILITOT 0.4 0.4   PT/INR Lab Results  Component Value Date   INR 0.97 01/04/2018   INR 1.09 08/06/2016   INR 1.00 03/22/2014   Hepatitis Panel No results for input(s): HEPBSAG, HCVAB, HEPAIGM, HEPBIGM in the last 72 hours. C-Diff No components found for: CDIFF Lipase     Component Value Date/Time   LIPASE 78 (H) 10/20/2019 0955    Drugs of Abuse     Component Value Date/Time   LABOPIA NONE DETECTED 01/04/2018 0929   COCAINSCRNUR NONE DETECTED 01/04/2018 0929   LABBENZ NONE  DETECTED 01/04/2018 0929   AMPHETMU NONE DETECTED 01/04/2018 0929   THCU NONE DETECTED 01/04/2018 0929   LABBARB NONE DETECTED 01/04/2018 0929     RADIOLOGY STUDIES: No results found.   IMPRESSION:   *    Abdominal pain.  Pattern of occurrence and exam is more consistent with musculoskeletal pain but the fact that it occurs when she lays down could also reflect reflux disease.  She had not been taking PPI until it was prescribed last Thursday.  Symptoms similar to what she has had in past years. Lipase mildly elevated with normal LFTs.  Same thing has occurred in the past and Dr. Carlean Purl did not feel she ever had pancreatitis Cholecystectomy 2007.  H. pylori gastritis 2014, H. pylori eradicated with negative stool antigen testing 07/2013.  Gastroparesis diagnosed based on retained gastric contents on EGDs 2004, 2010.  *   Adenomatous colon polyps 2010.  Diverticular bleeding with ABL/transfusion requiring anemia 2010.  Inflammatory colon polyp 2014.  Due for repeat colonoscopy 2024  *   Poorly controlled DM 2. Current A1c in the 7s, in past years it was as high as the 12's.  PLAN:     *   Per Dr Rush Landmark.   Patient already ate solid food for breakfast so if EGD planned, it would may need to be set up for tomorrow.  *   Switch back to oral PPI.  Continue Carafate for now.    Azucena Freed  10/22/2019, 9:59 AM Phone [phone]

## 2019-10-22 NOTE — Plan of Care (Signed)
  Problem: Education: Goal: Knowledge of General Education information will improve Description Including pain rating scale, medication(s)/side effects and non-pharmacologic comfort measures Outcome: Progressing   

## 2019-10-23 ENCOUNTER — Inpatient Hospital Stay (HOSPITAL_COMMUNITY): Payer: Medicare Other | Admitting: Certified Registered Nurse Anesthetist

## 2019-10-23 ENCOUNTER — Inpatient Hospital Stay (HOSPITAL_COMMUNITY): Payer: Medicare Other

## 2019-10-23 ENCOUNTER — Encounter (HOSPITAL_COMMUNITY)
Admission: EM | Disposition: A | Discharge status: Home / Self Care | Payer: Self-pay | Source: Home / Self Care | Attending: Family Medicine

## 2019-10-23 ENCOUNTER — Encounter (HOSPITAL_COMMUNITY): Payer: Self-pay | Admitting: Certified Registered Nurse Anesthetist

## 2019-10-23 DIAGNOSIS — K2990 Gastroduodenitis, unspecified, without bleeding: Secondary | ICD-10-CM

## 2019-10-23 DIAGNOSIS — K2970 Gastritis, unspecified, without bleeding: Secondary | ICD-10-CM

## 2019-10-23 DIAGNOSIS — K449 Diaphragmatic hernia without obstruction or gangrene: Secondary | ICD-10-CM

## 2019-10-23 HISTORY — PX: BIOPSY: SHX5522

## 2019-10-23 HISTORY — PX: ESOPHAGOGASTRODUODENOSCOPY (EGD) WITH PROPOFOL: SHX5813

## 2019-10-23 LAB — BASIC METABOLIC PANEL
Anion gap: 12 (ref 5–15)
BUN: 13 mg/dL (ref 8–23)
CO2: 24 mmol/L (ref 22–32)
Calcium: 8.9 mg/dL (ref 8.9–10.3)
Chloride: 102 mmol/L (ref 98–111)
Creatinine, Ser: 1.18 mg/dL — ABNORMAL HIGH (ref 0.44–1.00)
GFR calc Af Amer: 58 mL/min — ABNORMAL LOW (ref >60)
GFR calc non Af Amer: 50 mL/min — ABNORMAL LOW (ref >60)
Glucose, Bld: 119 mg/dL — ABNORMAL HIGH (ref 70–99)
Potassium: 3.6 mmol/L (ref 3.5–5.1)
Sodium: 138 mmol/L (ref 135–145)

## 2019-10-23 LAB — GLUCOSE, CAPILLARY
Glucose-Capillary: 94 mg/dL (ref 70–99)
Glucose-Capillary: 114 mg/dL — ABNORMAL HIGH (ref 70–99)
Glucose-Capillary: 113 mg/dL — ABNORMAL HIGH (ref 70–99)
Glucose-Capillary: 101 mg/dL — ABNORMAL HIGH (ref 70–99)

## 2019-10-23 LAB — CBC
HCT: 37.1 % (ref 36.0–46.0)
Hemoglobin: 12 g/dL (ref 12.0–15.0)
MCH: 28.1 pg (ref 26.0–34.0)
MCHC: 32.3 g/dL (ref 30.0–36.0)
MCV: 86.9 fL (ref 80.0–100.0)
Platelets: 200 10*3/uL (ref 150–400)
RBC: 4.27 MIL/uL (ref 3.87–5.11)
RDW: 13.5 % (ref 11.5–15.5)
WBC: 6.1 10*3/uL (ref 4.0–10.5)
nRBC: 0 % (ref 0.0–0.2)

## 2019-10-23 SURGERY — ESOPHAGOGASTRODUODENOSCOPY (EGD) WITH PROPOFOL
Anesthesia: Monitor Anesthesia Care

## 2019-10-23 MED ORDER — HYDROCHLOROTHIAZIDE 12.5 MG PO CAPS: 12.5000 mg | ORAL_CAPSULE | Freq: Every day | ORAL | Status: DC

## 2019-10-23 MED ORDER — PROPOFOL 500 MG/50ML IV EMUL
INTRAVENOUS | Status: DC | PRN
  Administered 2019-10-23: 75 ug/kg/min via INTRAVENOUS

## 2019-10-23 MED ORDER — LIDOCAINE 2% (20 MG/ML) 5 ML SYRINGE
INTRAMUSCULAR | Status: DC | PRN
  Administered 2019-10-23: 60 mg via INTRAVENOUS

## 2019-10-23 MED ORDER — HYDRALAZINE HCL 20 MG/ML IJ SOLN
INTRAMUSCULAR | Status: AC
  Filled 2019-10-23: qty 1

## 2019-10-23 MED ORDER — PANTOPRAZOLE SODIUM 40 MG PO TBEC
40.0000 mg | DELAYED_RELEASE_TABLET | Freq: Two times a day (BID) | ORAL | Status: DC
  Administered 2019-10-23 (×2): 40 mg via ORAL
  Filled 2019-10-23 (×2): qty 1

## 2019-10-23 MED ORDER — LISINOPRIL 10 MG PO TABS: 10.0000 mg | ORAL_TABLET | Freq: Every day | ORAL | Status: DC

## 2019-10-23 MED ORDER — HYDROCHLOROTHIAZIDE 12.5 MG PO CAPS
12.5000 mg | ORAL_CAPSULE | Freq: Every day | ORAL | Status: DC
  Administered 2019-10-23: 12.5 mg via ORAL
  Filled 2019-10-23: qty 1

## 2019-10-23 MED ORDER — PANTOPRAZOLE SODIUM 40 MG PO TBEC: 40.0000 mg | DELAYED_RELEASE_TABLET | Freq: Two times a day (BID) | ORAL | 0 refills | Status: DC

## 2019-10-23 MED ORDER — LISINOPRIL 10 MG PO TABS
10.0000 mg | ORAL_TABLET | Freq: Every day | ORAL | Status: DC
  Administered 2019-10-23: 10 mg via ORAL
  Filled 2019-10-23: qty 1

## 2019-10-23 MED ORDER — PROPOFOL 10 MG/ML IV BOLUS
INTRAVENOUS | Status: DC | PRN
  Administered 2019-10-23 (×2): 20 mg via INTRAVENOUS

## 2019-10-23 MED ORDER — HYDRALAZINE HCL 20 MG/ML IJ SOLN
10.0000 mg | Freq: Once | INTRAMUSCULAR | Status: AC
  Administered 2019-10-23: 10 mg via INTRAVENOUS

## 2019-10-23 SURGICAL SUPPLY — 14 items

## 2019-10-23 NOTE — Anesthesia Procedure Notes (Signed)
Procedure Name: MAC Date/Time: 10/23/2019 11:35 AM Performed by: Candis Shine, CRNA Pre-anesthesia Checklist: Patient identified, Emergency Drugs available, Patient being monitored, Suction available and Timeout performed Patient Re-evaluated:Patient Re-evaluated prior to induction Oxygen Delivery Method: Nasal cannula Dental Injury: Teeth and Oropharynx as per pre-operative assessment

## 2019-10-23 NOTE — Transfer of Care (Signed)
Immediate Anesthesia Transfer of Care Note  Patient: [name]  Procedure(s) Performed: ESOPHAGOGASTRODUODENOSCOPY (EGD) WITH PROPOFOL (N/A ) BIOPSY  Patient Location: Endoscopy Unit  Anesthesia Type:MAC  Level of Consciousness: awake, alert  and oriented  Airway & Oxygen Therapy: Patient Spontanous Breathing and Patient connected to nasal cannula oxygen  Post-op Assessment: Report given to RN and Post -op Vital signs reviewed and stable  Post vital signs: Reviewed and stable  Last Vitals:  Vitals Value Taken Time  BP    Temp    Pulse    Resp    SpO2      Last Pain:  Vitals:   10/23/19 1100  TempSrc: Temporal  PainSc: 0-No pain      Patients Stated Pain Goal: 2 (68/15/94 7076)  Complications: No apparent anesthesia complications

## 2019-10-23 NOTE — Anesthesia Postprocedure Evaluation (Signed)
Anesthesia Post Note  Patient: [name]  Procedure(s) Performed: ESOPHAGOGASTRODUODENOSCOPY (EGD) WITH PROPOFOL (N/A ) BIOPSY     Patient location during evaluation: PACU Anesthesia Type: MAC Level of consciousness: awake and alert Pain management: pain level controlled Vital Signs Assessment: post-procedure vital signs reviewed and stable Respiratory status: spontaneous breathing, nonlabored ventilation and respiratory function stable Cardiovascular status: stable and blood pressure returned to baseline Anesthetic complications: no    Last Vitals:  Vitals:   10/23/19 1240 10/23/19 1255  BP: (!) 169/64 (!) 123/51  Pulse:    Resp:  19  Temp:    SpO2:  99%    Last Pain:  Vitals:   10/23/19 1255  TempSrc:   PainSc: 0-No pain                 Audry Pili

## 2019-10-23 NOTE — Op Note (Signed)
[facility] Patient Name: [name] Procedure Date : 10/23/2019 MRN: [id number] Attending MD: Gerrit Heck , MD Date of Birth: Jun 15, 1956 CSN: [id number] Age: 63 Admit Type: Inpatient Procedure:                Upper GI endoscopy Indications:              Abdominal pain in the left upper quadrant, Nausea Providers:                Gerrit Heck, MD, Glori Bickers, RN, Benay Pillow, RN, Elspeth Cho Tech., Technician, Janeece Agee, Technician, Dellie Catholic, CRNA Referring MD:              Medicines:                Monitored Anesthesia Care Complications:            No immediate complications. Estimated Blood Loss:     Estimated blood loss was minimal. Procedure:                Pre-Anesthesia Assessment:                           - Prior to the procedure, a History and Physical                            was performed, and patient medications and                            allergies were reviewed. The patient's tolerance of                            previous anesthesia was also reviewed. The risks                            and benefits of the procedure and the sedation                            options and risks were discussed with the patient.                            All questions were answered, and informed consent                            was obtained. Prior Anticoagulants: The patient has                            taken no previous anticoagulant or antiplatelet                            agents. ASA Grade Assessment: III - A patient with  severe systemic disease. After reviewing the risks                            and benefits, the patient was deemed in                            satisfactory condition to undergo the procedure.                           After obtaining informed consent, the endoscope was                            passed under direct vision.  Throughout the                            procedure, the patient's blood pressure, pulse, and                            oxygen saturations were monitored continuously. The                            [id number] Olympus gastroscope was                            introduced through the mouth, and advanced to the                            second part of duodenum. The upper GI endoscopy was                            accomplished without difficulty. The patient                            tolerated the procedure well. Scope In: Scope Out: Findings:      A 2 cm hiatal hernia was present. LES laxity with a <2 cm transverse       width hernia noted on retroflexed views.      The Z-line was regular and was found 38 cm from the incisors.      The upper third of the esophagus and middle third of the esophagus were       normal.      Scattered minimal inflammation characterized by erythema was found in       the gastric body and in the gastric antrum. Biopsies were taken with a       cold forceps for Helicobacter pylori testing. Estimated blood loss was       minimal.      One 8 mm sessile polyp with no bleeding was found in the gastric body.       The polyp was removed with a cold biopsy forceps. Resection and       retrieval were complete. Estimated blood loss was minimal.      A small amount of food (residue) was found in the gastric body and in       the gastric antrum.      The duodenal bulb, first portion of the duodenum and  second portion of       the duodenum were normal. Biopsies for histology were taken with a cold       forceps for evaluation of celiac disease. Estimated blood loss was       minimal. Impression:               - 2 cm hiatal hernia.                           - Z-line regular, 38 cm from the incisors.                           - Normal upper third of esophagus and middle third                            of esophagus.                           - Gastritis.  Biopsied.                           - One gastric polyp. Resected and retrieved.                           - A small amount of food (residue) in the stomach.                           - Normal duodenal bulb, first portion of the                            duodenum and second portion of the duodenum.                            Biopsied.                           - Some small amounts of food residue noted in the                            stomach. Given time since last solid food                            ingestion, could be suggestive of gastroparesis,                            which was similarly noted on EGD in 2004 and 2010.                            Otherwise, widely patent pylorus, and no evidence                            of gastric outlet obstruction. Recommendation:           - Return patient to hospital ward for possible  discharge same day.                           - Full liquid diet today and advance as tolerated                            slowly.                           - Continue present medications.                           - Use Prilosec (omeprazole) 40 mg PO BID for 6                            weeks to promote mucosal healing, then reduce to                            lowest effective dose for ongoing control of any                            reflux symptoms.                           - Await pathology results.                           - Return to GI clinic at appointment to be                            scheduled.                           - If symptoms persist or recur, may consider                            outpatient Gastric Emptying Study for evaluation of                            gastroparesis. Procedure Code(s):        --- Professional ---                           [phone], Esophagogastroduodenoscopy, flexible,                            transoral; with biopsy, single or multiple Diagnosis Code(s):        --- Professional ---                            K44.9, Diaphragmatic hernia without obstruction or                            gangrene                           K29.70, Gastritis, unspecified, without bleeding  K31.7, Polyp of stomach and duodenum                           R10.12, Left upper quadrant pain                           R11.0, Nausea CPT copyright 2019 [employer]. All rights reserved. The codes documented in this report are preliminary and upon coder review may  be revised to meet current compliance requirements. Gerrit Heck, MD 10/23/2019 12:09:39 PM Number of Addenda: 0

## 2019-10-23 NOTE — Discharge Summary (Signed)
Physician Discharge Summary  [place] [id number] DOB: 1956/07/27 DOA: 10/20/2019  PCP: Bernerd Limbo, MD  Admit date: 10/20/2019 Discharge date: 10/23/2019  Admitted From:  Home  Disposition:  Home   Recommendations for Outpatient Follow-up and new medication changes:  1. Follow up with Dr. Coletta Memos in 1 week.  2. Continue pantoprazole 40 mg po bid for 6 weeks. 3. Full liquid diet and advance as tolerated.   Home Health: no   Equipment/Devices: no    Discharge Condition: stable  CODE STATUS: full  Diet recommendation: heart healthy and diabetic prudent.    Brief/Interim Summary: 63 year old female who presented with left upper quadrant abdominal pain.  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  Patient reports several days of abdominal pain, left upper quadrant, radiated to her back, she failed outpatient treatment.  She had progressive symptoms, and not tolerating p.o.  On her initial physical examination her blood pressure was 135/74, heart rate 126, respiratory rate 18, temperature 98.3, oxygen saturation 97%.  Her lungs were clear to auscultation bilaterally, heart S1-S2 present rhythm, the abdomen was tender to palpation at the midepigastric and left upper quadrant regions.  Na 136, potassium 3.6, chloride 101, bicarb 22, glucose 190, BUN 29, creatinine 2.4, white count 7.9, hemoglobin 13.6, hematocrit 42.9, platelets 257.  SARS COVID-19 was negative.  Patient was admitted to the hospital with a working diagnosis of acute kidney injury on chronic kidney disease in the setting of worsening abdominal pain.  Patient was placed on intravenous fluids with improvement of kidney function, her abdominal pain has been persistent despite antiacid therapy.  Gastroenterology has been consulted with recommendations for upper endoscopy,  chronic gastritis and a 2 cm hiatal hernia.  1.  Acute gastritis.  Patient was admitted to the medical ward, she received supportive medical therapy with antiacids (proton pump inhibitors and sucralfate), clear liquid diet and intravenous fluids. Due to persistent symptoms she was seen by gastroenterology and she underwent upper endoscopy, finding a 2 cm hiatal hernia, and gastritis.  Small amounts of food residue noted in the stomach.  Recommendations to continue proton pump inhibitors twice daily for 6 weeks, and out patient follow up, if persistent symptoms further work-up with gastric emptying study will be required, to rule out gastroparesis.   2.  Acute kidney injury on chronic kidney disease stage II.  Patient received supportive IV fluids, with balanced electrolyte solutions with good toleration, Her renal function improved and at discharge her serum creatinine was 1.18, potassium 3.6, bicarb 24 and chloride 102.  3.  Uncontrolled type II that is mellitus, hemoglobin A1c 7.5.  Patient was placed on insulin sliding scale for glucose coverage and monitoring while hospitalized with good toleration.  4.  History of CVA.  Continue aspirin and statin.  5.  Obesity.  Her BMI is 44.  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 Weekly- Monday   lisinopril-hydrochlorothiazide 20-12.5 MG tablet Commonly known as: ZESTORETIC Take 2 tablets by mouth daily.   NONFORMULARY OR COMPOUNDED ITEM Transdermal therapeutics cream   pantoprazole 40 MG tablet Commonly known as: PROTONIX Take 1 tablet (40 mg total) by mouth 2 (two) times daily.   silver sulfADIAZINE 1 % cream Commonly known as: SILVADENE silver sulfadiazine 1 % topical cream  APPLY TO AFFECTED AREA DAILY      Follow-up Information    Bernerd Limbo, MD Follow up in 1 week(s).   Specialty: Family Medicine Contact information: [address] [place]  [id number] [phone]          No Known Allergies  Consultations:  GI    Procedures/Studies:  No results found.   Procedures:  Endoscopy   Subjective: Patient is feeling better, dyspepsia is slowly improving, no chest pain, no dyspnea,. At home with increased stress.   Discharge Exam: Vitals:   10/23/19 1225 10/23/19 1226  BP: (!) 204/90 (!) 198/77  Pulse: 85   Resp: 13   Temp:    SpO2: 100%    Vitals:   10/23/19 1201 10/23/19 1210 10/23/19 1225 10/23/19 1226  BP: (!) 178/95 (!) 214/103 (!) 204/90 (!) 198/77  Pulse: 88 87 85   Resp: 18 15 13    Temp: (!) 97.3 F (36.3 C)     TempSrc: Temporal     SpO2: 99% 100% 100%   Weight:      Height:        General: Not in pain or dyspnea  Neurology: Awake and alert, non focal  E ENT: mild pallor, no icterus, oral mucosa moist Cardiovascular: No JVD. S1-S2 present, rhythmic, no gallops, rubs, or murmurs. No lower extremity edema. Pulmonary: positive breath sounds bilaterally, adequate air movement, no wheezing, rhonchi or rales. Gastrointestinal. Abdomen with no organomegaly, non tender, no rebound or guarding Skin. No rashes Musculoskeletal: no joint deformities   The results of significant diagnostics from this hospitalization (including  imaging, microbiology, ancillary and laboratory) are listed below for reference.     Microbiology: Recent Results (from the past 240 hour(s))  SARS CORONAVIRUS 2 (TAT 6-24 HRS) Nasopharyngeal Nasopharyngeal Swab     Status: None   Collection Time: 10/20/19 11:40 AM   Specimen: Nasopharyngeal Swab  Result Value Ref Range Status   SARS Coronavirus 2 NEGATIVE NEGATIVE Final    Comment: (NOTE) SARS-CoV-2 target nucleic acids are NOT DETECTED. The SARS-CoV-2 RNA is generally detectable in upper and lower respiratory specimens during the acute phase of infection. Negative results do not preclude SARS-CoV-2 infection, do not rule out co-infections with other pathogens, and should not be used as the sole basis for treatment or other patient management decisions. Negative results must be combined with clinical observations, patient history, and epidemiological information. The expected result is Negative. Fact Sheet for Patients: SugarRoll.be Fact Sheet for Healthcare Providers: https://www.woods-mathews.com/ This test is not yet approved or cleared by the [place] FDA and  has been authorized for detection and/or diagnosis of SARS-CoV-2 by FDA under an Emergency Use Authorization (EUA). This EUA will remain  in effect (meaning this test can be used) for the duration of the COVID-19 declaration under Section 56 4(b)(1)  of the Act, 21 U.S.C. section 360bbb-3(b)(1), unless the authorization is terminated or revoked sooner. Performed at [facility], [place] [address], [place], [place] [address]      Labs: BNP (last 3 results) No results for input(s): BNP in the last 8760 hours. Basic Metabolic Panel: Recent Labs  Lab 10/20/19 0955 10/21/19 0256 10/22/19 0338 10/23/19 0224  NA 136 137 139 138  K 3.6 3.6 3.7 3.6  CL 101 102 103 102  CO2 22 23 25 24   GLUCOSE 198* 133* 142* 119*  BUN 29* 26* 17 13  CREATININE 2.40* 1.85* 1.50*  1.18*  CALCIUM 9.3 8.5* 8.8* 8.9   Liver Function Tests: Recent Labs  Lab 10/20/19 0955 10/22/19 0338  AST 18 29  ALT 18 16  ALKPHOS 86 64  BILITOT 0.4 0.4  PROT 7.2 5.9*  ALBUMIN 3.0* 2.4*   Recent Labs  Lab 10/20/19 0955  LIPASE 78*   No results for input(s): AMMONIA in the last 168 hours. CBC: Recent Labs  Lab 10/20/19 0955 10/21/19 0256 10/22/19 0338 10/23/19 0224  WBC 7.9 7.2 5.9 6.1  HGB 13.6 11.5* 11.4* 12.0  HCT 42.9 35.9* 35.5* 37.1  MCV 89.6 88.6 88.1 86.9  PLT 257 203 204 200   Cardiac Enzymes: No results for input(s): CKTOTAL, CKMB, CKMBINDEX, TROPONINI in the last 168 hours. BNP: Invalid input(s): POCBNP CBG: Recent Labs  Lab 10/22/19 1257 10/22/19 1708 10/22/19 2123 10/23/19 0756 10/23/19 1053  GLUCAP 96 103* 113* 94 114*   D-Dimer No results for input(s): DDIMER in the last 72 hours. Hgb A1c No results for input(s): HGBA1C in the last 72 hours. Lipid Profile No results for input(s): CHOL, HDL, LDLCALC, TRIG, CHOLHDL, LDLDIRECT in the last 72 hours. Thyroid function studies No results for input(s): TSH, T4TOTAL, T3FREE, THYROIDAB in the last 72 hours.  Invalid input(s): FREET3 Anemia work up No results for input(s): VITAMINB12, FOLATE, FERRITIN, TIBC, IRON, RETICCTPCT in the last 72 hours. Urinalysis    Component Value Date/Time   COLORURINE YELLOW 10/20/2019 0940   APPEARANCEUR CLOUDY (A) 10/20/2019 0940   LABSPEC 1.025 10/20/2019 0940   PHURINE 5.5 10/20/2019 0940   GLUCOSEU 100 (A) 10/20/2019 0940   GLUCOSEU NEGATIVE 07/28/2009 1005   HGBUR TRACE (A) 10/20/2019 0940   BILIRUBINUR SMALL (A) 10/20/2019 0940   BILIRUBINUR  08/20/2013 1147     Comment:     Limited sample, not performed   KETONESUR NEGATIVE 10/20/2019 0940   PROTEINUR >300 (A) 10/20/2019 0940   UROBILINOGEN 0.2 03/01/2014 1303   NITRITE NEGATIVE 10/20/2019 0940   LEUKOCYTESUR NEGATIVE 10/20/2019 0940   Sepsis Labs Invalid input(s): PROCALCITONIN,  WBC,   LACTICIDVEN Microbiology Recent Results (from the past 240 hour(s))  SARS CORONAVIRUS 2 (TAT 6-24 HRS) Nasopharyngeal Nasopharyngeal Swab     Status: None   Collection Time: 10/20/19 11:40 AM   Specimen: Nasopharyngeal Swab  Result Value Ref Range Status   SARS Coronavirus 2 NEGATIVE NEGATIVE Final    Comment: (NOTE) SARS-CoV-2 target nucleic acids are NOT DETECTED. The SARS-CoV-2 RNA is generally detectable in upper and lower respiratory specimens during the acute phase of infection. Negative results do not preclude SARS-CoV-2 infection, do not rule out co-infections with other pathogens, and should not be used as the sole basis for treatment or other patient management decisions. Negative results must be combined with clinical observations, patient history, and epidemiological information. The expected result is Negative. Fact Sheet for Patients: SugarRoll.be Fact Sheet for Healthcare Providers: https://www.woods-mathews.com/ This  test is not yet approved or cleared by the [place] and  has been authorized for detection and/or diagnosis of SARS-CoV-2 by FDA under an Emergency Use Authorization (EUA). This EUA will remain  in effect (meaning this test can be used) for the duration of the COVID-19 declaration under Section 56 4(b)(1) of the Act, 21 U.S.C. section 360bbb-3(b)(1), unless the authorization is terminated or revoked sooner. Performed at [facility], [place] [address], [place],  [address]      Time coordinating discharge: 45 minutes  SIGNED:   Tawni Millers, MD  [facility] 10/23/2019, 12:32 PM

## 2019-10-23 NOTE — Anesthesia Preprocedure Evaluation (Addendum)
Anesthesia Evaluation  Patient identified by MRN, date of birth, ID band Patient awake    Reviewed: Allergy & Precautions, NPO status , Patient's Chart, lab work & pertinent test results  History of Anesthesia Complications Negative for: history of anesthetic complications  Airway Mallampati: II  TM Distance: >3 FB Neck ROM: Full    Dental  (+) Dental Advisory Given   Pulmonary neg pulmonary ROS,    Pulmonary exam normal        Cardiovascular hypertension, Pt. on medications Normal cardiovascular exam   '19 TTE -  EF 60% to 65%. Grade 1 diastolic dysfunction.Trivial AI, mildly dilated LA.  '19 Carotid US - no stenosis identified in either side    Neuro/Psych PSYCHIATRIC DISORDERS Depression CVA (patient denies), No Residual Symptoms    GI/Hepatic Neg liver ROS, hiatal hernia, GERD  Medicated and Controlled, IBS    Endo/Other  diabetes, Type 2, Insulin DependentMorbid obesity  Renal/GU CRFRenal disease Hx RCC s/p left nephrectomy      Musculoskeletal negative musculoskeletal ROS (+)   Abdominal (+) + obese,   Peds  Hematology negative hematology ROS (+)   Anesthesia Other Findings Covid negative 12/6   Reproductive/Obstetrics                            Anesthesia Physical Anesthesia Plan  ASA: III  Anesthesia Plan: MAC   Post-op Pain Management:    Induction: Intravenous  PONV Risk Score and Plan: 2 and Propofol infusion and Treatment may vary due to age or medical condition  Airway Management Planned: Nasal Cannula and Natural Airway  Additional Equipment: None  Intra-op Plan:   Post-operative Plan:   Informed Consent: I have reviewed the patients History and Physical, chart, labs and discussed the procedure including the risks, benefits and alternatives for the proposed anesthesia with the patient or authorized representative who has indicated his/her understanding  and acceptance.       Plan Discussed with: CRNA and Anesthesiologist  Anesthesia Plan Comments:        Anesthesia Quick Evaluation

## 2019-10-24 ENCOUNTER — Other Ambulatory Visit: Payer: Self-pay | Admitting: Physician Assistant

## 2019-10-24 LAB — SURGICAL PATHOLOGY

## 2019-10-24 LAB — GLUCOSE, CAPILLARY: Glucose-Capillary: 110 mg/dL — ABNORMAL HIGH (ref 70–99)

## 2019-10-24 NOTE — Progress Notes (Signed)
Trisha Mangle to be D/C'd  per MD order. Discussed with the patient and all questions fully answered.  VSS, Skin clean, dry and intact without evidence of skin break down, no evidence of skin tears noted.  IV catheter discontinued intact. Site without signs and symptoms of complications. Dressing and pressure applied.  An After Visit Summary was printed and given to the patient. Patient received prescription.  D/c education completed with patient/family including follow up instructions, medication list, d/c activities limitations if indicated, with other d/c instructions as indicated by MD - patient able to verbalize understanding, all questions fully answered.   Patient instructed to return to ED, call 911, or call MD for any changes in condition.   Patient to be escorted via [place], and D/C home via private auto.

## 2019-10-24 NOTE — Progress Notes (Signed)
Patient is feeling better, tolerating po well, with no nausea or vomiting.  Her vitals are stable: BP 158/85, HR 98, RR 14, 02 saturation 100 on RA.  Abdomen is soft and non tender.   Patient will be discharge home today and follow up as out patient.

## 2019-10-29 ENCOUNTER — Encounter: Payer: Self-pay | Admitting: Gastroenterology

## 2019-11-04 ENCOUNTER — Encounter: Payer: Self-pay | Admitting: Gastroenterology

## 2019-11-11 ENCOUNTER — Other Ambulatory Visit (INDEPENDENT_AMBULATORY_CARE_PROVIDER_SITE_OTHER): Payer: Medicare Other

## 2019-11-11 ENCOUNTER — Other Ambulatory Visit: Payer: Self-pay

## 2019-11-11 ENCOUNTER — Encounter: Payer: Self-pay | Admitting: Physician Assistant

## 2019-11-11 ENCOUNTER — Ambulatory Visit (INDEPENDENT_AMBULATORY_CARE_PROVIDER_SITE_OTHER): Payer: Medicare Other | Admitting: Physician Assistant

## 2019-11-11 VITALS — BP 150/84 | HR 96 | Temp 98.7°F | Ht 61.0 in | Wt 229.2 lb

## 2019-11-11 DIAGNOSIS — R1012 Left upper quadrant pain: Secondary | ICD-10-CM | POA: Diagnosis not present

## 2019-11-11 DIAGNOSIS — R0781 Pleurodynia: Secondary | ICD-10-CM | POA: Diagnosis not present

## 2019-11-11 LAB — BASIC METABOLIC PANEL
BUN: 34 mg/dL — ABNORMAL HIGH (ref 6–23)
CO2: 29 mEq/L (ref 19–32)
Calcium: 9.4 mg/dL (ref 8.4–10.5)
Chloride: 102 mEq/L (ref 96–112)
Creatinine, Ser: 1.79 mg/dL — ABNORMAL HIGH (ref 0.40–1.20)
GFR: 34.72 mL/min — ABNORMAL LOW (ref >60.00)
Glucose, Bld: 123 mg/dL — ABNORMAL HIGH (ref 70–99)
Potassium: 3.8 mEq/L (ref 3.5–5.1)
Sodium: 138 mEq/L (ref 135–145)

## 2019-11-11 NOTE — Progress Notes (Signed)
Subjective:    Patient ID: [name], female    DOB: 12-Jan-1956, 63 y.o.   MRN: [id number]  HPI [name] is a pleasant 63 year old African-American female, established with Dr. Carlean Purl, who comes in today for follow-up after recent hospitalization 12/6 through 10/23/2019. She said she had been having upper abdominal pain primarily left-sided for about 2 weeks prior to hospitalization.  This seem to be particularly bothersome at night when she would lay down.  She describes it as being left-sided and radiating around into the left back.  She had not been aware of any known injury fall etc., and no prior history of disc disease. She was seen by GI during her hospitalization and underwent EGD with Dr. Bryan Lemma on 10/23/2019 which showed a 2 cm hiatal hernia and scattered mild gastritis as well as one 8 mm sessile gastric polyp.  There was a small amount of residual food noted in her stomach.  Biopsies were taken of the small bowel which were negative, gastric biopsies negative for H. pylori and the polyp was a fundic gland polyp. Upper abdominal ultrasound was mentioned in the notes but was not done prior to discharge.  She has been on pantoprazole 40 mg p.o. twice daily since then. She says at the time of hospitalization she had not been eating well for several days, appetite had been decreased and she had developed acute kidney injury. Today she says for the first time over the past 2 days she has been feeling significantly better and her pain at most was a 3 out of 10.  At the time she was admitted to the hospital she says the pain was a 10 out of 10.  She has been eating without difficulty though still light, has no complaints of nausea or vomiting.  No fevers, no diarrhea or changes in bowel habits. Other medical problems include hypertension, IBS, diverticulosis, adult onset diabetes mellitus, history of hemorrhagic CVA 2019, remote history of renal cell CA status post laparoscopic left  nephrectomy 2004, morbid obesity, prior cholecystectomy. Last colonoscopy done in 2014 with 2 small polyps removed which by biopsy were benign colonic mucosa and an inflammatory polyp.  Noted to have severe diverticulosis.  Review of Systems Pertinent positive and negative review of systems were noted in the above HPI section.  All other review of systems was otherwise negative.  Outpatient Encounter Medications as of 11/11/2019  Medication Sig  . BYDUREON 2 MG PEN Inject 2 mg into the skin every 7 (seven) days. Weekly- Monday  . lisinopril-hydrochlorothiazide (ZESTORETIC) 20-12.5 MG tablet Take 2 tablets by mouth daily.  . pantoprazole (PROTONIX) 40 MG tablet Take 1 tablet (40 mg total) by mouth 2 (two) times daily.  . [DISCONTINUED] NONFORMULARY OR COMPOUNDED ITEM Transdermal therapeutics cream  . [DISCONTINUED] silver sulfADIAZINE (SILVADENE) 1 % cream silver sulfadiazine 1 % topical cream  APPLY TO AFFECTED AREA DAILY   No facility-administered encounter medications on file as of 11/11/2019.   No Known Allergies Patient Active Problem List   Diagnosis Date Noted  . Gastritis and gastroduodenitis   . Hiatal hernia   . AKI (acute kidney injury) ([place]) 10/20/2019  . Hyperlipidemia   . ICH (intracerebral hemorrhage) ([place]) 01/04/2018  . Sepsis ([place]) 08/06/2016  . Diverticulitis 08/06/2016  . CKD (chronic kidney disease) stage 2, GFR 60-89 ml/min   . Diverticulitis of intestine without perforation or abscess without bleeding   . Neuralgia of chest 12/30/2015  . Atypical chest pain 12/01/2015  . Physical exam 10/26/2015  .  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 Maxillary sinusitis, acute 01/21/2015  . Strain of left pectoralis muscle 09/26/2014  . Benign paroxysmal positional vertigo 10/03/2013  . Nausea with vomiting 10/03/2013  . Bleeding nose 10/03/2013  . Helicobacter pylori  gastritis 09/13/2013  . Pyuria 08/20/2013  . Abnormal abdominal CT scan 08/20/2013  . Abdominal pain 08/16/2013  . Back pain 07/31/2013  . LUQ pain 07/31/2013  . Laceration of hand 07/10/2013  . Hidradenitis suppurativa of right axilla 05/31/2013  . Renal cell carcinoma   . IBS (irritable bowel syndrome)   . S/p nephrectomy   . 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 OBESITY, MORBID 09/16/2009  . Essential hypertension 06/23/2009  . Personal history of colonoc adenomas 12/12/2008   Social History   Socioeconomic History  . Marital status: Divorced    Spouse name: Not on file  . Number of children: 3  . Years of education: Not on file  . Highest education level: Not on file  Occupational History  . Occupation: retired  Tobacco Use  . Smoking status: Never Smoker  . Smokeless tobacco: Never Used  Substance and Sexual Activity  . Alcohol use: No    Alcohol/week: 0.0 standard drinks  . Drug use: No  . Sexual activity: Never  Other Topics Concern  . Not on file  Social History Narrative   Regular exercise: walk 2 days, aerobics 3 days[facility]   Caffeine use: coffee every AM   Social Determinants of Health   Financial Resource Strain:   . Difficulty of Paying Living Expenses: Not on file  Food Insecurity:   . Worried About Charity fundraiser in the Last Year: Not on file  . Ran Out of Food in the Last Year: Not on file  Transportation Needs:   . Lack of Transportation (Medical): Not on file  . Lack of Transportation (Non-Medical): Not on file  Physical Activity:   . Days of Exercise per Week: Not on file  . Minutes of Exercise per Session: Not on file  Stress:   . Feeling of Stress : Not on file  Social Connections:   . Frequency of Communication with Friends and Family: Not on file  . Frequency of Social Gatherings with Friends and Family: Not on file  . Attends Religious Services: Not on file  . Active Member of Clubs  or Organizations: Not on file  . Attends Archivist Meetings: Not on file  . Marital Status: Not on file  Intimate Partner Violence:   . Fear of Current or Ex-Partner: Not on file  . Emotionally Abused: Not on file  . Physically Abused: Not on file  . Sexually Abused: Not on file    Ms. Klausner's family history includes Breast cancer in her daughter and maternal grandmother; Colon polyps in her mother; Diabetes in her father and maternal grandmother.      Objective:    Vitals:   11/11/19 1354  BP: (!) 150/84  Pulse: 96  Temp: 98.7 F (37.1 C)    Physical Exam Well-developed well-nourished older African-American female in no acute distress.   Weight, 229 BMI 43.3  HEENT; nontraumatic normocephalic, EOMI, PER R LA, sclera anicteric. Oropharynx; not examined/mask/Covid Neck; supple, no JVD Cardiovascular; regular rate and rhythm with S1-S2, no murmur rub or gallop Pulmonary; Clear bilaterally Abdomen; soft, obese, nondistended, she is tender along the  left costal margin and around laterally over the left lower ribs.  She is also tender in the left upper quadrant, no palpable mass or hepatosplenomegaly, bowel sounds are active.  Periumbilical port incisional scars from left nephrectomy Rectal; not done Skin; benign exam, no jaundice rash or appreciable lesions Extremities; no clubbing cyanosis or edema skin warm and dry Neuro/Psych; alert and oriented x4, grossly nonfocal mood and affect appropriate       Assessment & Plan:   #38 63 year old African-American female with 3 to 4-week history of persistent left upper quadrant abdominal pain radiating around into the back, with some improvement over the last few days.  EGD during hospitalization 12/6 through 10/23/2019 showed a small hiatal hernia and scattered gastritis not severe. She did not have any other abdominal imaging done during hospitalization. Etiology of her pain is not clear though on exam suspect a  musculoskeletal component with significant costal margin tenderness.  Rule out occult neoplasm, rule out other intra-abdominal inflammatory process  #2 history of diverticulosis 3.  Status post remote left nephrectomy 2004-renal cell CA 4.  Adult onset diabetes mellitus 5.  History of hemorrhagic CVA 2019 6.  Chronic kidney disease #7 IBS 8.  Hypertension 9.  Status post cholecystectomy 10.  Morbid obesity  Plan;BMET today Continue Protonix twice daily through this week then decrease to once daily x1 month then discontinue Patient will be scheduled for CT scan of the abdomen and pelvis and lower chest. She will use Tylenol on a as needed basis. Further recommendations pending findings of CT.  Amy Genia Harold PA-C 11/11/2019   Cc: Bernerd Limbo, MD

## 2019-11-11 NOTE — Patient Instructions (Addendum)
If you are age 62 or older, your body mass index should be between 23-30. Your Body mass index is 43.32 kg/m. If this is out of the aforementioned range listed, please consider follow up with your Primary Care Provider.  If you are age 59 or younger, your body mass index should be between 19-25. Your Body mass index is 43.32 kg/m. If this is out of the aformentioned range listed, please consider follow up with your Primary Care Provider.   Your provider has requested that you go to the basement level for lab work before leaving today. Press "B" on the elevator. The lab is located at the first door on the left as you exit the elevator.  You have been scheduled for a CT scan of the abdomen and pelvis at [facility] are scheduled on Tuesday 11/19/19 at 4 pm. You should arrive 15 minutes prior to your appointment time for registration. Please follow the written instructions below on the day of your exam:  WARNING: IF YOU ARE ALLERGIC TO IODINE/X-RAY DYE, PLEASE NOTIFY RADIOLOGY IMMEDIATELY AT [phone]! YOU WILL BE GIVEN A 13 HOUR PREMEDICATION PREP.  1) Do not eat or drink anything after 12 pm (4 hours prior to your test) 2) You have been given 2 bottles of oral contrast to drink. The solution may taste better if refrigerated, but do NOT add ice or any other liquid to this solution. Shake well before drinking.    Drink 1 bottle of contrast @ 2 pm (2 hours prior to your exam)  Drink 1 bottle of contrast @ 3 pm (1 hour prior to your exam)  You may take any medications as prescribed with a small amount of water, if necessary. If you take any of the following medications: METFORMIN, GLUCOPHAGE, GLUCOVANCE, AVANDAMET, RIOMET, FORTAMET, [place] MET, JANUMET, GLUMETZA or METAGLIP, you MAY be asked to HOLD this medication 48 hours AFTER the exam.  The purpose of you drinking the oral contrast is to aid in the visualization of your intestinal tract. The contrast solution may cause some  diarrhea. Depending on your individual set of symptoms, you may also receive an intravenous injection of x-ray contrast/dye.   This test typically takes 30-45 minutes to complete.   _________________________________________________________________  Continue Pantropazole twice daily until the end of this week, then decrease to once daily for one month, then stop.

## 2019-11-13 ENCOUNTER — Other Ambulatory Visit: Payer: Self-pay

## 2019-11-19 ENCOUNTER — Ambulatory Visit (HOSPITAL_COMMUNITY)
Admission: RE | Admit: 2019-11-19 | Discharge: 2019-11-19 | Disposition: A | Discharge status: Home / Self Care | Payer: Medicare Other | Source: Ambulatory Visit | Attending: Physician Assistant | Admitting: Physician Assistant

## 2019-11-19 ENCOUNTER — Other Ambulatory Visit: Payer: Self-pay

## 2019-11-19 DIAGNOSIS — R1012 Left upper quadrant pain: Secondary | ICD-10-CM

## 2019-11-19 DIAGNOSIS — R0781 Pleurodynia: Secondary | ICD-10-CM | POA: Diagnosis present

## 2019-11-19 NOTE — Progress Notes (Signed)
Please call patient and let her know the CT scan does not show any abnormality in her abdomen to account for the left upper quadrant pain radiating to her back.  The area where she had her left kidney removed shows no recurrent disease..  No sign of diverticulitis or other inflammatory process in her bowel.  She has a small ventral hernia on the right, which does not appear to be causing any problems.  Please find out how she is doing as far as pain is concerned, hopefully she is feeling better.

## 2019-11-21 ENCOUNTER — Telehealth: Payer: Self-pay | Admitting: Physician Assistant

## 2019-11-21 NOTE — Telephone Encounter (Signed)
Spoke to the patient who could not hear to VM message that was left by [facility] LPN. Reviewed the results with the patient. Patient continues to experience pain but was satisfied with the results.

## 2019-11-21 NOTE — Telephone Encounter (Signed)
Pt inquired about CT results.  

## 2019-12-12 IMAGING — CT CT HEAD W/O CM
3 series · 15 of 47 positions shown, 18 images · non-contrast
Comparison: 01/04/2018

CLINICAL DATA: Followup intracranial hemorrhage.

EXAM:
CT HEAD WITHOUT CONTRAST
TECHNIQUE: Contiguous axial images were obtained from the base of the skull
through the vertex without intravenous contrast.

[Series 3: head 5.0 h30s · axial · 0.44mm/px · z∈[-146,-16]mm · 9 of 32 slices shown, 12 images]
[im 3/32  brain]
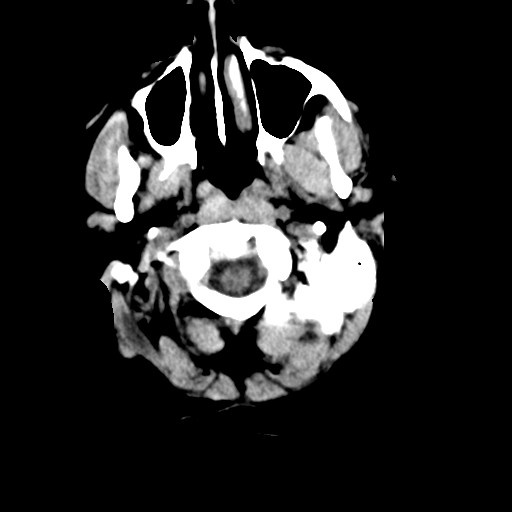
[im 3/32  bone]
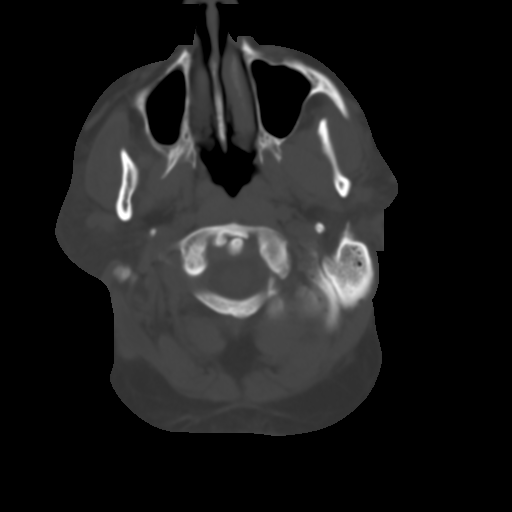
[im 6/32  brain]
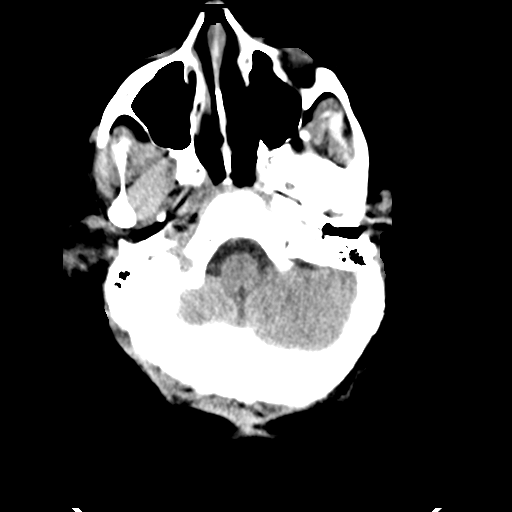
[im 9/32  brain]
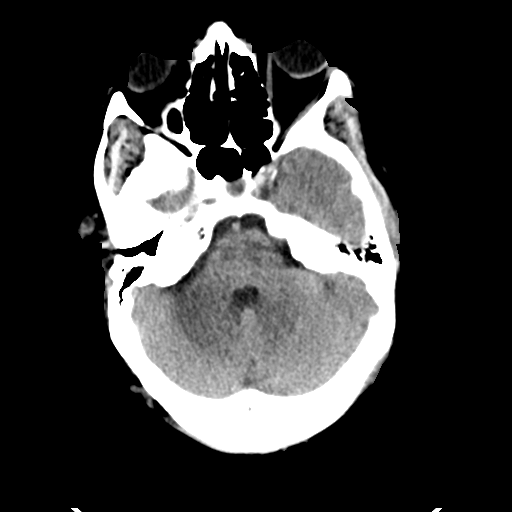
[im 12/32  brain]
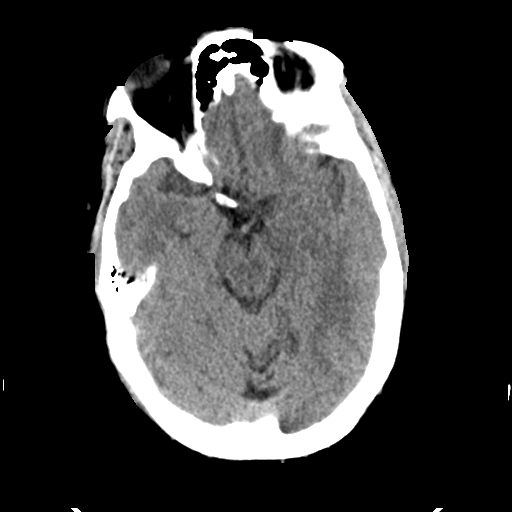
[im 17/32  brain]
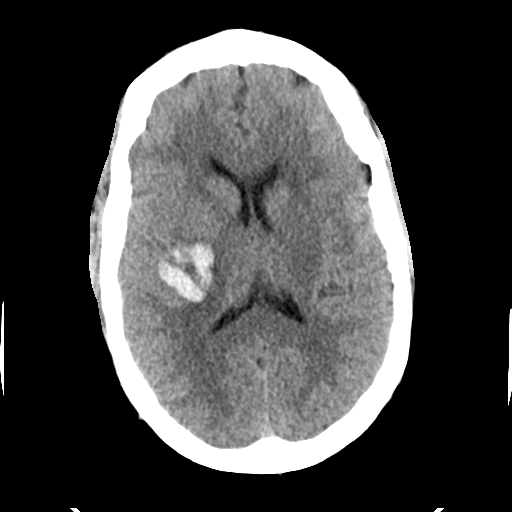
[im 17/32  bone]
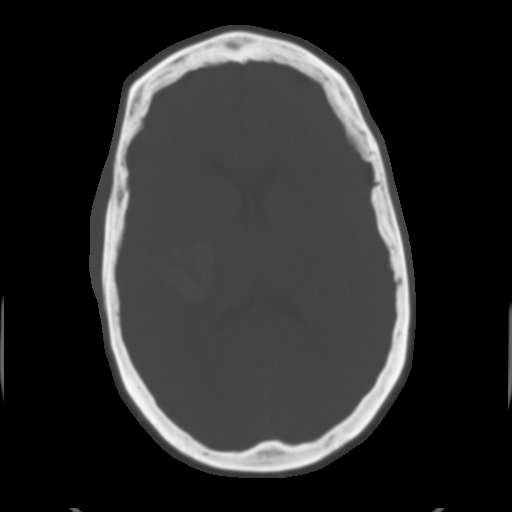
[im 20/32  brain]
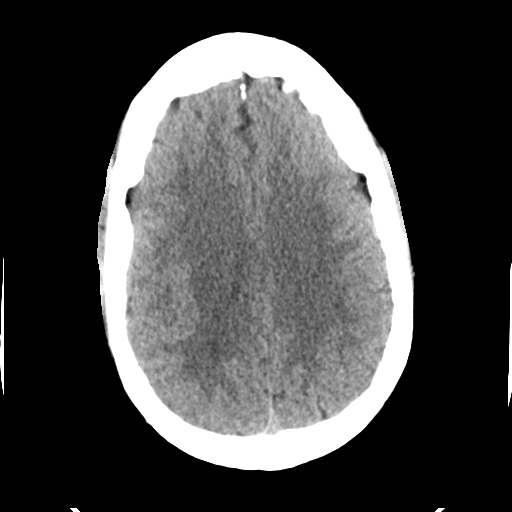
[im 23/32  brain]
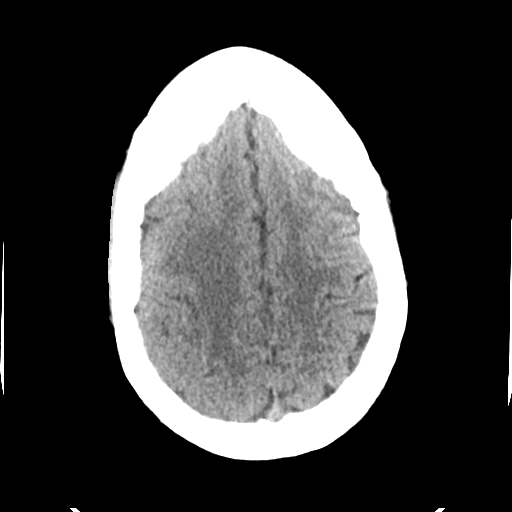
[im 26/32  brain]
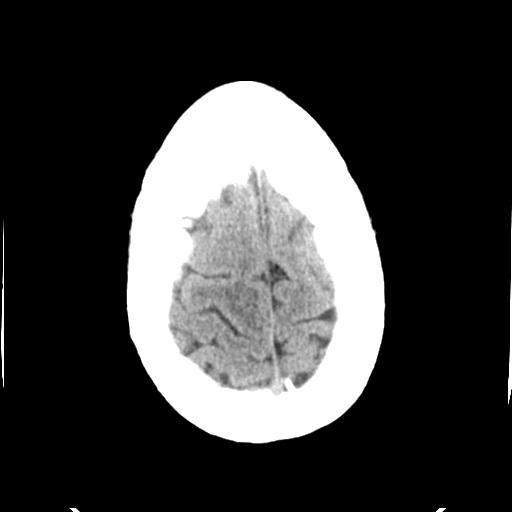
[im 29/32  brain]
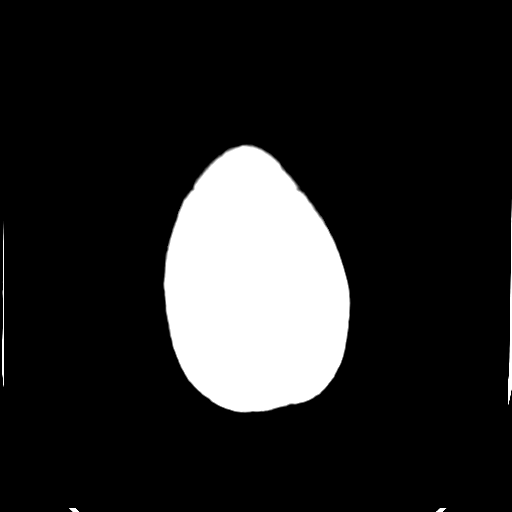
[im 29/32  bone]
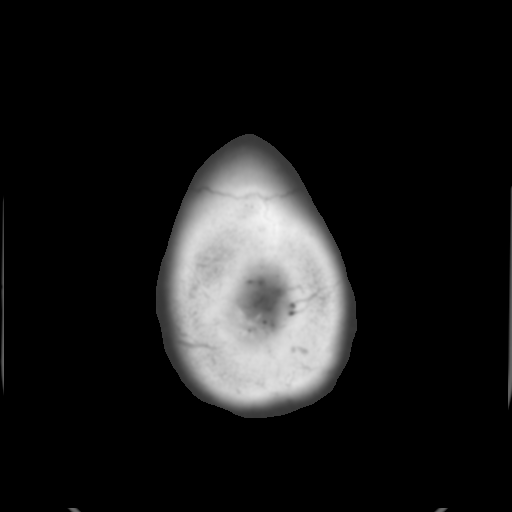

[Series 5: head 3.0 mpr cor · coronal · 0.28mm/px · 3 of 71 slices shown]
[im 24/71  brain]
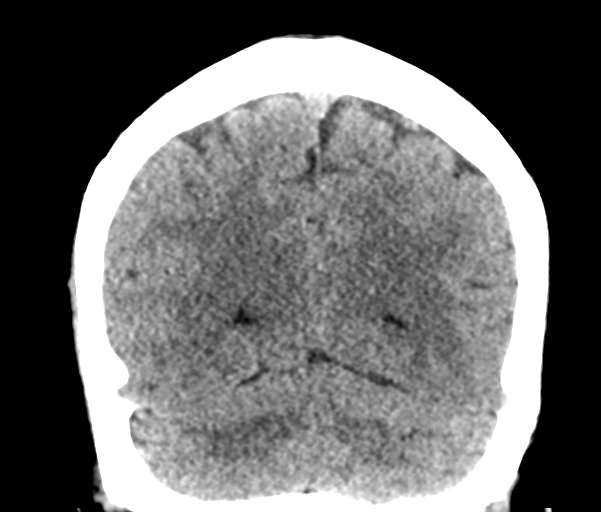
[im 32/71  brain]
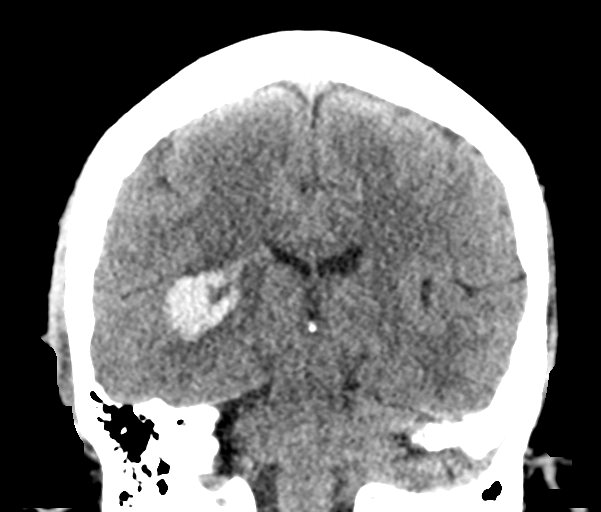
[im 39/71  brain]
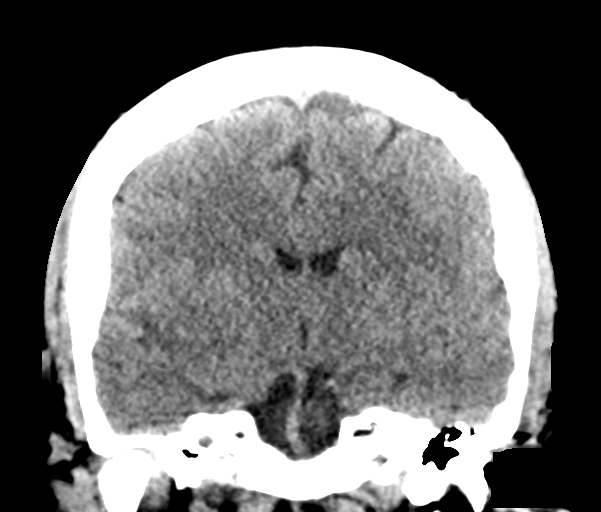

[Series 6: head 3.0 mpr sag · sagittal · 0.30mm/px · 3 of 67 slices shown]
[im 23/67  brain]
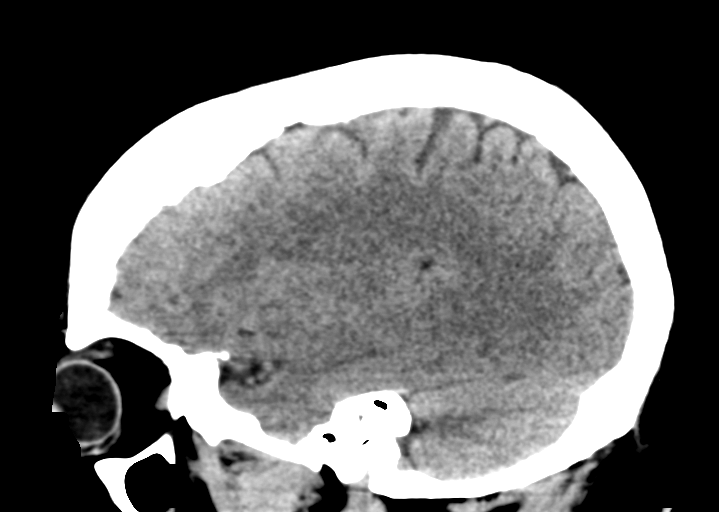
[im 34/67  brain]
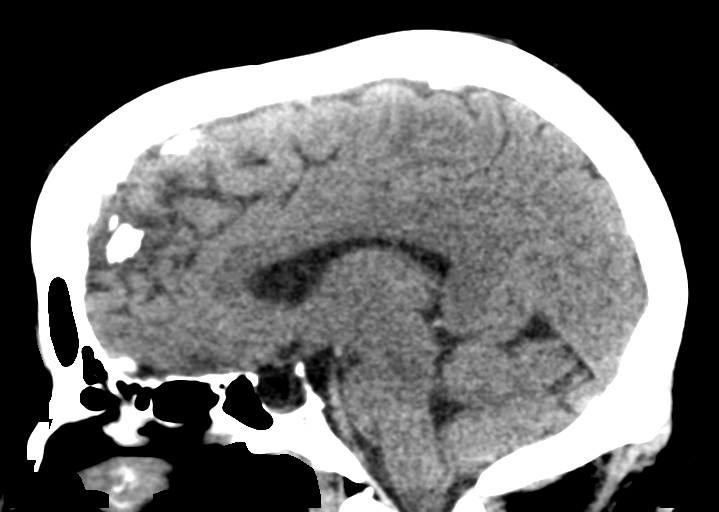
[im 45/67  brain]
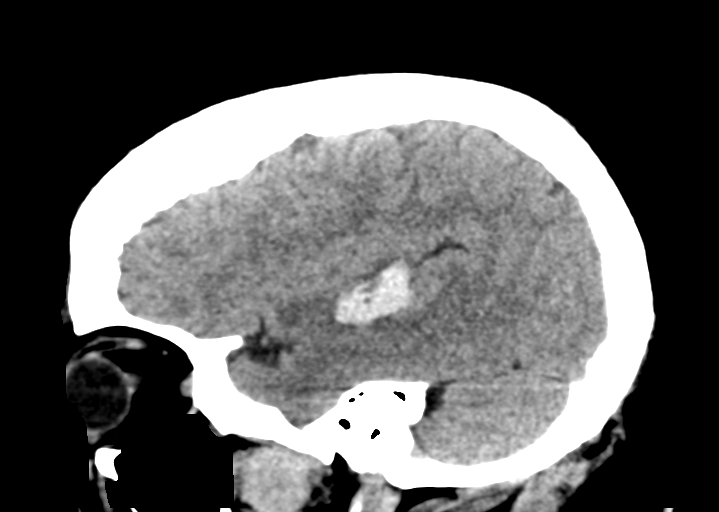

[15 of 47 positions shown; findings below may reference images not displayed]

FINDINGS: Brain: Intraparenchymal hemorrhage within the posterior basal
ganglia/radiating white matter tracts on the right is stable.
Measuring in the same locations, size today is 3.3 x 2.3 x 1.5 cm.
Mild surrounding edema. No evidence of subarachnoid or
intraventricular penetration. Mild mass effect with right-to-left
shift of 2 mm. No other abnormal brain finding..

Vascular: There is atherosclerotic calcification of the major
vessels at the base of the brain.

Skull: Negative

Sinuses/Orbits: Clear/normal

Other: None
IMPRESSION: No change in a right posterior basal ganglia/radiating white matter
track intraparenchymal hematoma, measuring 3.3 x 2.3 x 1.5 cm with
mild surrounding edema and mild mass effect with right-to-left shift
of 2 mm.

## 2020-01-09 ENCOUNTER — Emergency Department (HOSPITAL_COMMUNITY): Payer: Medicare Other

## 2020-01-09 ENCOUNTER — Emergency Department (HOSPITAL_COMMUNITY)
Admission: EM | Admit: 2020-01-09 | Discharge: 2020-01-09 | Disposition: A | Discharge status: Home / Self Care | Payer: Medicare Other | Attending: Emergency Medicine | Admitting: Emergency Medicine

## 2020-01-09 ENCOUNTER — Other Ambulatory Visit: Payer: Self-pay

## 2020-01-09 ENCOUNTER — Encounter (HOSPITAL_COMMUNITY): Payer: Self-pay | Admitting: *Deleted

## 2020-01-09 DIAGNOSIS — Z905 Acquired absence of kidney: Secondary | ICD-10-CM | POA: Diagnosis not present

## 2020-01-09 DIAGNOSIS — R410 Disorientation, unspecified: Secondary | ICD-10-CM | POA: Insufficient documentation

## 2020-01-09 DIAGNOSIS — R4182 Altered mental status, unspecified: Secondary | ICD-10-CM | POA: Diagnosis present

## 2020-01-09 DIAGNOSIS — Z79899 Other long term (current) drug therapy: Secondary | ICD-10-CM | POA: Diagnosis not present

## 2020-01-09 DIAGNOSIS — I129 Hypertensive chronic kidney disease with stage 1 through stage 4 chronic kidney disease, or unspecified chronic kidney disease: Secondary | ICD-10-CM | POA: Diagnosis not present

## 2020-01-09 DIAGNOSIS — Z8673 Personal history of transient ischemic attack (TIA), and cerebral infarction without residual deficits: Secondary | ICD-10-CM | POA: Insufficient documentation

## 2020-01-09 DIAGNOSIS — N182 Chronic kidney disease, stage 2 (mild): Secondary | ICD-10-CM | POA: Diagnosis not present

## 2020-01-09 DIAGNOSIS — Z85528 Personal history of other malignant neoplasm of kidney: Secondary | ICD-10-CM | POA: Diagnosis not present

## 2020-01-09 LAB — COMPREHENSIVE METABOLIC PANEL
ALT: 20 U/L (ref 0–44)
AST: 22 U/L (ref 15–41)
Albumin: 2.9 g/dL — ABNORMAL LOW (ref 3.5–5.0)
Alkaline Phosphatase: 88 U/L (ref 38–126)
Anion gap: 10 (ref 5–15)
BUN: 24 mg/dL — ABNORMAL HIGH (ref 8–23)
CO2: 26 mmol/L (ref 22–32)
Calcium: 9 mg/dL (ref 8.9–10.3)
Chloride: 103 mmol/L (ref 98–111)
Creatinine, Ser: 1.3 mg/dL — ABNORMAL HIGH (ref 0.44–1.00)
GFR calc Af Amer: 51 mL/min — ABNORMAL LOW (ref >60)
GFR calc non Af Amer: 44 mL/min — ABNORMAL LOW (ref >60)
Glucose, Bld: 151 mg/dL — ABNORMAL HIGH (ref 70–99)
Potassium: 3.7 mmol/L (ref 3.5–5.1)
Sodium: 139 mmol/L (ref 135–145)
Total Bilirubin: 0.6 mg/dL (ref 0.3–1.2)
Total Protein: 6.9 g/dL (ref 6.5–8.1)

## 2020-01-09 LAB — PROTIME-INR
INR: 0.9 (ref 0.8–1.2)
Prothrombin Time: 12.4 seconds (ref 11.4–15.2)

## 2020-01-09 LAB — CBC WITH DIFFERENTIAL/PLATELET
Abs Immature Granulocytes: 0.01 10*3/uL (ref 0.00–0.07)
Basophils Absolute: 0 10*3/uL (ref 0.0–0.1)
Basophils Relative: 0 %
Eosinophils Absolute: 0.1 10*3/uL (ref 0.0–0.5)
Eosinophils Relative: 2 %
HCT: 40.2 % (ref 36.0–46.0)
Hemoglobin: 12.4 g/dL (ref 12.0–15.0)
Immature Granulocytes: 0 %
Lymphocytes Relative: 37 %
Lymphs Abs: 2.6 10*3/uL (ref 0.7–4.0)
MCH: 28.1 pg (ref 26.0–34.0)
MCHC: 30.8 g/dL (ref 30.0–36.0)
MCV: 91.2 fL (ref 80.0–100.0)
Monocytes Absolute: 0.7 10*3/uL (ref 0.1–1.0)
Monocytes Relative: 10 %
Neutro Abs: 3.7 10*3/uL (ref 1.7–7.7)
Neutrophils Relative %: 51 %
Platelets: 233 10*3/uL (ref 150–400)
RBC: 4.41 MIL/uL (ref 3.87–5.11)
RDW: 13.5 % (ref 11.5–15.5)
WBC: 7.2 10*3/uL (ref 4.0–10.5)
nRBC: 0 % (ref 0.0–0.2)

## 2020-01-09 LAB — RAPID URINE DRUG SCREEN, HOSP PERFORMED
Amphetamines: NOT DETECTED
Barbiturates: NOT DETECTED
Benzodiazepines: NOT DETECTED
Cocaine: NOT DETECTED
Opiates: NOT DETECTED
Tetrahydrocannabinol: NOT DETECTED

## 2020-01-09 LAB — ETHANOL: Alcohol, Ethyl (B): <10 mg/dL (ref <10)

## 2020-01-09 LAB — AMMONIA: Ammonia: 35 umol/L (ref 9–35)

## 2020-01-09 LAB — CBG MONITORING, ED: Glucose-Capillary: 138 mg/dL — ABNORMAL HIGH (ref 70–99)

## 2020-01-09 NOTE — ED Triage Notes (Signed)
Patient presents to ed via [facility] states she woke up this am confused, per family patient was last seen normal at 3pm yest. Went to bed around 9pm and per patient she was feeling ok. C/o dizziness. Denies pain , states her head feels "different". Patient will answer questions appropriately however she pauses before answering. Moves all ext. X 4 bialteral grips strong and equal.

## 2020-01-09 NOTE — ED Provider Notes (Signed)
[facility] EMERGENCY DEPARTMENT Provider Note   CSN: [id number] Arrival date & time: 01/09/20  1124     History Chief Complaint  Patient presents with  . Altered Mental Status    [name] is a 64 y.o. female with a past medical history of CKD, depression, diabetes, hypertension, renal cell carcinoma status post left nephrectomy, stroke, obesity, who presents today for evaluation of altered mental status. History obtained from chart review, patient, and her grandson Vonna Kotyk with whom she gave me permission to speak.  Patient was last seen normal at 3 PM yesterday.  Her grandson reports that when he got home at 66 he noted that she was acting different and was not as interactive or talkative as usual.  He states that she normally has "a very loud and big voice" and that he only heard her talk about 2 times which is abnormal for her. Patient denies falling.  She denies any new medications or medication changes.  She reports that she feels "sleepy" and that her head feels "different."  She denies any difficulty walking. She states that she had not eaten today. She denies any recent sickness or illness.  Of note patient recently put coconut oil on her face giving her the appearance of being diaphoretic, only her face.   HPI     Past Medical History:  Diagnosis Date  . Anemia   . Blood transfusion 2010   associated with GI bleeding  . CKD (chronic kidney disease) stage 2, GFR 60-89 ml/min   . Depression   . Diabetes mellitus   . Diverticulosis of colon with hemorrhage 2010, 09/2011  . H/O hiatal hernia   . Helicobacter pylori gastritis 09/13/2013  . Hx: UTI (urinary tract infection)   . Hypertension   . IBS (irritable bowel syndrome)   . Personal history of colonic polyps    adenomas since 2010  . Renal cell carcinoma 2004   lt kidney removed  . Stroke [facility]) 12/2017    Patient Active Problem List   Diagnosis Date Noted  . Gastritis and  gastroduodenitis   . Hiatal hernia   . AKI (acute kidney injury) ([place]) 10/20/2019  . Hyperlipidemia   . ICH (intracerebral hemorrhage) ([place]) 01/04/2018  . Sepsis ([place]) 08/06/2016  . Diverticulitis 08/06/2016  . CKD (chronic kidney disease) stage 2, GFR 60-89 ml/min   . Diverticulitis of intestine without perforation or abscess without bleeding   . Neuralgia of chest 12/30/2015  . Atypical chest pain 12/01/2015  . Physical exam 10/26/2015  . 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 Maxillary sinusitis, acute 01/21/2015  . Strain of left pectoralis muscle 09/26/2014  . Benign paroxysmal positional vertigo 10/03/2013  . Nausea with vomiting 10/03/2013  . Bleeding nose 10/03/2013  . Helicobacter pylori gastritis 09/13/2013  . Pyuria 08/20/2013  . Abnormal abdominal CT scan 08/20/2013  . Abdominal pain 08/16/2013  . Back pain 07/31/2013  . LUQ pain 07/31/2013  . Laceration of hand 07/10/2013  . Hidradenitis suppurativa of right axilla 05/31/2013  . Renal cell carcinoma   . IBS (irritable bowel syndrome)   . S/p nephrectomy   . 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 OBESITY, MORBID 09/16/2009  . Essential hypertension 06/23/2009  . Personal history of colonoc adenomas 12/12/2008    Past Surgical History:  Procedure Laterality Date  . ABDOMINAL HYSTERECTOMY    .  ABDOMINAL HYSTERECTOMY    . BIOPSY  10/23/2019   Procedure: BIOPSY;  Surgeon: Lavena Bullion, DO;  Location: [facility];  Service: Gastroenterology;;  . CHOLECYSTECTOMY    . COLONOSCOPY  12/19/2008   diverticulosis  . COLONOSCOPY  11/17/2011   Procedure: COLONOSCOPY;  Surgeon: Zenovia Jarred, MD;  Location: [facility];  Service: Gastroenterology;  Laterality: N/A;  . DILATION AND CURETTAGE OF UTERUS N/A 07/11/2014   Procedure: Repair of Vaginal Cuff;  Surgeon: Melina Schools, MD;   Location: [facility];  Service: Gynecology;  Laterality: N/A;  . ESOPHAGOGASTRODUODENOSCOPY (EGD) WITH PROPOFOL N/A 10/23/2019   Procedure: ESOPHAGOGASTRODUODENOSCOPY (EGD) WITH PROPOFOL;  Surgeon: Lavena Bullion, DO;  Location: [facility];  Service: Gastroenterology;  Laterality: N/A;  . FLEXIBLE SIGMOIDOSCOPY  12/12/2008   diverticulosis  . HAND SURGERY     x5 right  . HYDRADENITIS EXCISION Right 03/21/2014   Procedure: EXCISION HIDRADENITIS AXILLA;  Surgeon: Pedro Earls, MD;  Location: [facility];  Service: General;  Laterality: Right;  . KNEE ARTHROSCOPY     x2 left  . NEPHRECTOMY     left  . TRIGGER FINGER RELEASE Left 06/23/2015   Procedure: RELEASE TRIGGER FINGER/A-1 PULLEY LEFT RING FINGER;  Surgeon: Daryll Brod, MD;  Location: [facility];  Service: Orthopedics;  Laterality: Left;  . TUBAL LIGATION    . UPPER GASTROINTESTINAL ENDOSCOPY  12/12/2008   gastroporesis     OB History    Gravida  3   Para  3   Term      Preterm      AB      Living  3     SAB      TAB      Ectopic      Multiple      Live Births              Family History  Problem Relation Age of Onset  . Colon polyps Mother   . Diabetes Maternal Grandmother   . Breast cancer Maternal Grandmother   . Diabetes Father   . Breast cancer Daughter   . Colon cancer Neg Hx     Social History   Tobacco Use  . Smoking status: Never Smoker  . Smokeless tobacco: Never Used  Substance Use Topics  . Alcohol use: No    Alcohol/week: 0.0 standard drinks  . Drug use: No    Home Medications Prior to Admission medications   Medication Sig Start Date End Date Taking? Authorizing Provider  BYDUREON 2 MG PEN Inject 2 mg into the skin every 7 (seven) days. Weekly- Monday 01/19/18  Yes [provider]  lisinopril-hydrochlorothiazide (ZESTORETIC) 20-12.5 MG tablet Take 2 tablets by mouth daily.   Yes [provider]  pantoprazole (PROTONIX) 40 MG tablet  Take 1 tablet (40 mg total) by mouth 2 (two) times daily. 10/23/19 12/04/19  Arrien, Jimmy Picket, MD    Allergies    Patient has no known allergies.  Review of Systems   Review of Systems  Constitutional: Negative for chills and fever.  Respiratory: Negative for chest tightness and shortness of breath.   Cardiovascular: Negative for chest pain.  Gastrointestinal: Negative for abdominal pain.  Genitourinary: Negative for enuresis.  Musculoskeletal: Negative for back pain and neck pain.  Skin: Negative for color change, rash and wound.  Neurological:       "feels different"  Psychiatric/Behavioral: Positive for confusion. The patient is not nervous/anxious.   All other systems  reviewed and are negative.   Physical Exam Updated Vital Signs BP (!) 169/74 (BP Location: Right Arm)   Pulse 79   Temp 98.5 F (36.9 C) (Oral)   Resp 18   Ht 5\' 4"  (1.626 m)   Wt 113.4 kg   LMP 11/16/2002   SpO2 97%   BMI 42.91 kg/m   Physical Exam Vitals and nursing note reviewed.  Constitutional:      General: She is not in acute distress.    Appearance: She is well-developed. She is not ill-appearing or diaphoretic (Face appears diaphoretic, but on discussion with patient is coconut oil. ).  HENT:     Head: Normocephalic and atraumatic.     Comments: No racoon eyes or battle signs bilaterally.     Mouth/Throat:     Mouth: Mucous membranes are moist.  Eyes:     Conjunctiva/sclera: Conjunctivae normal.  Cardiovascular:     Rate and Rhythm: Normal rate and regular rhythm.     Pulses: Normal pulses.     Heart sounds: Normal heart sounds. No murmur.  Pulmonary:     Effort: Pulmonary effort is normal. No respiratory distress.     Breath sounds: Normal breath sounds.  Abdominal:     Palpations: Abdomen is soft.     Tenderness: There is no abdominal tenderness.  Musculoskeletal:     Cervical back: Normal range of motion and neck supple. No rigidity.     Right lower leg: No edema.     Left  lower leg: No edema.  Skin:    General: Skin is warm and dry.  Neurological:     Mental Status: She is alert.     Sensory: No sensory deficit.     Motor: No weakness.     Coordination: Coordination normal.     Comments: Mental Status:  Patient is alert, she is oriented to person, place and time She is slow to answer pausing before she speaks. Speech intermittently sounds slightly slurred, however is able to repeat complex phrases without difficulty. Cranial Nerves:  II:  Peripheral visual fields grossly normal, pupils equal, round, reactive to light III,IV, VI: ptosis not present, extra-ocular motions intact bilaterally  V,VII: smile symmetric, facial light touch sensation equal VIII: hearing grossly normal to voice  X: uvula elevates symmetrically  XI: bilateral shoulder shrug symmetric and strong XII: midline tongue extension without fassiculations Motor:  Normal tone. 5/5 in upper and lower extremities bilaterally including strong and equal grip strength and dorsiflexion/plantar flexion Gait is normal without ataxia.  No notable leg weakness. Coordination is intact grossly to bilateral upper and lower extremities. CV: distal pulses palpable throughout    Psychiatric:        Mood and Affect: Mood normal.        Behavior: Behavior normal.     ED Results / Procedures / Treatments   Labs (all labs ordered are listed, but only abnormal results are displayed) Labs Reviewed  COMPREHENSIVE METABOLIC PANEL - Abnormal; Notable for the following components:      Result Value   Glucose, Bld 151 (*)    BUN 24 (*)    Creatinine, Ser 1.30 (*)    Albumin 2.9 (*)    GFR calc non Af Amer 44 (*)    GFR calc Af Amer 51 (*)    All other components within normal limits  CBG MONITORING, ED - Abnormal; Notable for the following components:   Glucose-Capillary 138 (*)    All other components within normal  limits  CBC WITH DIFFERENTIAL/PLATELET  ETHANOL  RAPID URINE DRUG SCREEN, HOSP  PERFORMED  PROTIME-INR  AMMONIA  URINALYSIS, ROUTINE W REFLEX MICROSCOPIC    EKG None  Radiology CT HEAD WO CONTRAST  Result Date: 01/09/2020 CLINICAL DATA:  Mental status change. Confusion. EXAM: CT HEAD WITHOUT CONTRAST TECHNIQUE: Contiguous axial images were obtained from the base of the skull through the vertex without intravenous contrast. COMPARISON:  02/16/2018 FINDINGS: Brain: Focal encephalomalacia in the right posterior basal ganglia at site of prior intraparenchymal bleed. Small remote lacunar infarcts in the left basal ganglia in anterior limb of the right internal capsule. No acute intracranial hemorrhage. No mass effect or midline shift. Brain volume is normal for age. No hydrocephalus. The basilar cisterns are patent. No evidence of territorial infarct or acute ischemia. No extra-axial or intracranial fluid collection. Partially empty sella, unchanged. Vascular: Atherosclerosis of skullbase vasculature without hyperdense vessel or abnormal calcification. Skull: No fracture or focal lesion. Sinuses/Orbits: Paranasal sinuses and mastoid air cells are clear. The visualized orbits are unremarkable. Other: None. IMPRESSION: 1. No acute intracranial abnormality. 2. Focal encephalomalacia in the right posterior basal ganglia at site of prior intraparenchymal bleed. Electronically Signed   By: Keith Rake M.D.   On: 01/09/2020 12:46    Procedures Procedures (including critical care time)  Medications Ordered in ED Medications - No data to display  ED Course  I have reviewed the triage vital signs and the nursing notes.  Pertinent labs & imaging results that were available during my care of the patient were reviewed by me and considered in my medical decision making (see chart for details).  Clinical Course as of Jan 08 2099  Thu Jan 09, 2020  1142 Vonna Kotyk [id number]   [EH]  [place] reports he left at 3pm and she was normal, at 9pm when came home noted that she  wasn't talking, was quiet.    [EH]  1217 I ambulated patient in room.  Gait is normal.  She ambulated to bathroom.  She was instructed to pull the cord for help ambulating back to the room and not to walk alone.     [EH]  1526 I was informed by nursing staff that previous UA may have gotten lost due to tube system difficulties.    [EH]    Clinical Course User Index [EH] Ollen Gross   MDM Rules/Calculators/A&P                     Patient presents today for evaluation of confusion and feeling tired. On my exam she is awake and alert, she is oriented to person, place, and time.  She reports overall feeling tired.  She states that she felt confused and very tired.  She is unable to give a clear example of when she felt confused.  Additional history obtained from her grandson who is also unable to give an example of when she was confused. On exam she is neurologically intact.  Physical exam is overall unremarkable.  Labs are obtained and reviewed, CMP, CBC are consistent with her baseline.  PT/INR is not elevated.  Ammonia and ethanol are both normal.  Attempted to obtain urine sample, however initial sample was reportedly stuck in the pneumatic tube system due to intermittent difficulties with it today.  Patient was unable to provide enough volume and a repeat sample for a UA, however she does not have any dysuria increased frequency or urgency therefore low suspicion for a UTI.  She was able to give enough urine for a UDS which was negative. CT scan of her head was without evidence of acute abnormalities and unremarkable. Here she was occasionally slightly slow to respond, however she was observed in the emergency room for 7-1/2 hours without worsening condition.  She was allowed to eat and drink in the emergency room which she did without difficulties and attempts to obtain urine sample, and after which she eat and drink she reported that she felt significantly better and back to her  baseline and wished for discharge home.  This patient was seen as a shared visit with Dr. Eulis Foster.  Return precautions were discussed with patient who states their understanding.  At the time of discharge patient denied any unaddressed complaints or concerns.  Patient is agreeable for discharge home.  Note: Portions of this report may have been transcribed using voice recognition software. Every effort was made to ensure accuracy; however, inadvertent computerized transcription errors may be present  Final Clinical Impression(s) / ED Diagnoses Final diagnoses:  Confusion    Rx / DC Orders ED Discharge Orders    None       Ollen Gross 01/09/20 2106    Daleen Bo, MD 01/09/20 2111

## 2020-01-09 NOTE — ED Notes (Signed)
Pt only able to provide small amount of urine for UA. Sample sent to lab. Provider aware

## 2020-01-09 NOTE — ED Provider Notes (Signed)
  Face-to-face evaluation   History: Patient describes feeling ill he is since yesterday, sleepiness, confusion and being uncomfortable with being alone.  She lives with her grandson, after his mother died in 2017-02-23.  He is returning to in person school, next week.  She was worried about having another head bleed so came here.  Physical exam: Alert, calm and cooperative.  No dysarthria or aphasia.  No nystagmus.  Normal strength arms and legs bilaterally.  Psychiatric-she appears depressed, talks slowly, and he is lucid.  Medical screening examination/treatment/procedure(s) were conducted as a shared visit with non-physician practitioner(s) and myself.  I personally evaluated the patient during the encounter    Daleen Bo, MD 01/09/20 February 23, 2110

## 2020-01-09 NOTE — ED Notes (Signed)
Patient verbalizes understanding of discharge instructions. Opportunity for questioning and answers were provided. Armband removed by staff, pt discharged from ED by wheelchair and pt's daughter in lobby to transport her home

## 2020-01-09 NOTE — Discharge Instructions (Addendum)
Please make sure you are eating and drinking.  If you are not doing this then your body cannot support you as needed. If your symptoms worsen, return, or you have any concerns please seek additional medical care and evaluation.

## 2020-01-09 NOTE — ED Notes (Addendum)
Per previous RN, lab unable to locate sample that was sent down several hours ago. Pt just used restroom but drinking water in order to provide another sample. Provider aware

## 2020-01-15 ENCOUNTER — Ambulatory Visit: Payer: Medicare Other | Admitting: Adult Health

## 2020-01-22 ENCOUNTER — Encounter: Payer: Self-pay | Admitting: Adult Health

## 2020-01-31 DIAGNOSIS — K449 Diaphragmatic hernia without obstruction or gangrene: Secondary | ICD-10-CM

## 2020-01-31 DIAGNOSIS — K2990 Gastroduodenitis, unspecified, without bleeding: Secondary | ICD-10-CM

## 2020-01-31 DIAGNOSIS — K2970 Gastritis, unspecified, without bleeding: Secondary | ICD-10-CM

## 2020-03-10 ENCOUNTER — Ambulatory Visit: Payer: Medicare Other | Admitting: Adult Health

## 2020-03-28 ENCOUNTER — Ambulatory Visit: Payer: Medicare Other

## 2020-03-30 ENCOUNTER — Ambulatory Visit: Payer: Medicare Other | Attending: Internal Medicine

## 2020-03-30 DIAGNOSIS — Z23 Encounter for immunization: Secondary | ICD-10-CM

## 2020-03-30 NOTE — Progress Notes (Signed)
   Covid-19 Vaccination Clinic  Name:  [name]    MRN: [id number] DOB: 03/03/1956  03/30/2020  [name] was observed post Covid-19 immunization for 15 minutes without incident. She was provided with Vaccine Information Sheet and instruction to access the V-Safe system.   [name] was instructed to call 911 with any severe reactions post vaccine: Mar[place] Kitchen Difficulty breathing  . Swelling of face and throat  . A fast heartbeat  . A bad rash all over body  . Dizziness and weakness   Immunizations Administered    Name Date Dose VIS Date Route   Pfizer COVID-19 Vaccine 03/30/2020  3:50 PM 0.3 mL 01/08/2019 Intramuscular   Manufacturer: [place]   Lot: [id number]   [place]: [id number]

## 2020-04-20 ENCOUNTER — Ambulatory Visit: Payer: Medicare Other | Attending: Internal Medicine

## 2020-04-20 DIAGNOSIS — Z23 Encounter for immunization: Secondary | ICD-10-CM

## 2020-04-20 NOTE — Progress Notes (Signed)
° °  Covid-19 Vaccination Clinic  Name:  [name]    MRN: [id number] DOB: 01-07-1956  04/20/2020  [name] was observed post Covid-19 immunization for 15 minutes without incident. She was provided with Vaccine Information Sheet and instruction to access the V-Safe system.   [name] was instructed to call 911 with any severe reactions post vaccine:  Difficulty breathing   Swelling of face and throat   A fast heartbeat   A bad rash all over body   Dizziness and weakness   Immunizations Administered    Name Date Dose VIS Date Route   Pfizer COVID-19 Vaccine 04/20/2020  2:18 PM 0.3 mL 01/08/2019 Intramuscular   Manufacturer: [place]   Lot: [id number]   [place]: [id number]

## 2020-07-29 DIAGNOSIS — M65311 Trigger thumb, right thumb: Secondary | ICD-10-CM | POA: Insufficient documentation

## 2021-05-14 DIAGNOSIS — I6381 Other cerebral infarction due to occlusion or stenosis of small artery: Secondary | ICD-10-CM

## 2021-05-14 HISTORY — DX: Other cerebral infarction due to occlusion or stenosis of small artery: I63.81

## 2021-05-18 ENCOUNTER — Other Ambulatory Visit: Payer: Self-pay

## 2021-05-18 ENCOUNTER — Encounter (HOSPITAL_COMMUNITY): Payer: Self-pay | Admitting: Emergency Medicine

## 2021-05-18 ENCOUNTER — Emergency Department (HOSPITAL_COMMUNITY): Payer: Medicare Other

## 2021-05-18 ENCOUNTER — Inpatient Hospital Stay (HOSPITAL_COMMUNITY)
Admission: EM | Admit: 2021-05-18 | Discharge: 2021-05-20 | DRG: 065 | Disposition: A | Discharge status: Home / Self Care | Payer: Medicare Other | Attending: Internal Medicine | Admitting: Internal Medicine

## 2021-05-18 DIAGNOSIS — F40240 Claustrophobia: Secondary | ICD-10-CM | POA: Diagnosis present

## 2021-05-18 DIAGNOSIS — I6340 Cerebral infarction due to embolism of unspecified cerebral artery: Principal | ICD-10-CM | POA: Diagnosis present

## 2021-05-18 DIAGNOSIS — Z8371 Family history of colonic polyps: Secondary | ICD-10-CM

## 2021-05-18 DIAGNOSIS — N1832 Chronic kidney disease, stage 3b: Secondary | ICD-10-CM | POA: Diagnosis present

## 2021-05-18 DIAGNOSIS — E1122 Type 2 diabetes mellitus with diabetic chronic kidney disease: Secondary | ICD-10-CM | POA: Diagnosis present

## 2021-05-18 DIAGNOSIS — Z79899 Other long term (current) drug therapy: Secondary | ICD-10-CM

## 2021-05-18 DIAGNOSIS — Z85528 Personal history of other malignant neoplasm of kidney: Secondary | ICD-10-CM

## 2021-05-18 DIAGNOSIS — I672 Cerebral atherosclerosis: Secondary | ICD-10-CM | POA: Diagnosis present

## 2021-05-18 DIAGNOSIS — I639 Cerebral infarction, unspecified: Secondary | ICD-10-CM | POA: Diagnosis present

## 2021-05-18 DIAGNOSIS — I129 Hypertensive chronic kidney disease with stage 1 through stage 4 chronic kidney disease, or unspecified chronic kidney disease: Secondary | ICD-10-CM | POA: Diagnosis present

## 2021-05-18 DIAGNOSIS — Z905 Acquired absence of kidney: Secondary | ICD-10-CM

## 2021-05-18 DIAGNOSIS — G8194 Hemiplegia, unspecified affecting left nondominant side: Secondary | ICD-10-CM | POA: Diagnosis present

## 2021-05-18 DIAGNOSIS — F32A Depression, unspecified: Secondary | ICD-10-CM | POA: Diagnosis present

## 2021-05-18 DIAGNOSIS — Z6841 Body Mass Index (BMI) 40.0 and over, adult: Secondary | ICD-10-CM

## 2021-05-18 DIAGNOSIS — E785 Hyperlipidemia, unspecified: Secondary | ICD-10-CM | POA: Diagnosis present

## 2021-05-18 DIAGNOSIS — R29702 NIHSS score 2: Secondary | ICD-10-CM | POA: Diagnosis present

## 2021-05-18 DIAGNOSIS — E1142 Type 2 diabetes mellitus with diabetic polyneuropathy: Secondary | ICD-10-CM | POA: Diagnosis present

## 2021-05-18 DIAGNOSIS — Z8719 Personal history of other diseases of the digestive system: Secondary | ICD-10-CM

## 2021-05-18 DIAGNOSIS — I69398 Other sequelae of cerebral infarction: Secondary | ICD-10-CM

## 2021-05-18 DIAGNOSIS — Z90710 Acquired absence of both cervix and uterus: Secondary | ICD-10-CM

## 2021-05-18 DIAGNOSIS — Z803 Family history of malignant neoplasm of breast: Secondary | ICD-10-CM

## 2021-05-18 DIAGNOSIS — Z833 Family history of diabetes mellitus: Secondary | ICD-10-CM

## 2021-05-18 DIAGNOSIS — R201 Hypoesthesia of skin: Secondary | ICD-10-CM | POA: Diagnosis present

## 2021-05-18 LAB — APTT: aPTT: 31 seconds (ref 24–36)

## 2021-05-18 LAB — DIFFERENTIAL
Abs Immature Granulocytes: 0.02 10*3/uL (ref 0.00–0.07)
Basophils Absolute: 0 10*3/uL (ref 0.0–0.1)
Basophils Relative: 1 %
Eosinophils Absolute: 0.2 10*3/uL (ref 0.0–0.5)
Eosinophils Relative: 2 %
Immature Granulocytes: 0 %
Lymphocytes Relative: 47 %
Lymphs Abs: 4 10*3/uL (ref 0.7–4.0)
Monocytes Absolute: 0.7 10*3/uL (ref 0.1–1.0)
Monocytes Relative: 8 %
Neutro Abs: 3.6 10*3/uL (ref 1.7–7.7)
Neutrophils Relative %: 42 %

## 2021-05-18 LAB — PROTIME-INR
INR: 1 (ref 0.8–1.2)
Prothrombin Time: 12.7 seconds (ref 11.4–15.2)

## 2021-05-18 LAB — COMPREHENSIVE METABOLIC PANEL
ALT: 18 U/L (ref 0–44)
AST: 21 U/L (ref 15–41)
Albumin: 3.2 g/dL — ABNORMAL LOW (ref 3.5–5.0)
Alkaline Phosphatase: 82 U/L (ref 38–126)
Anion gap: 9 (ref 5–15)
BUN: 31 mg/dL — ABNORMAL HIGH (ref 8–23)
CO2: 26 mmol/L (ref 22–32)
Calcium: 9.3 mg/dL (ref 8.9–10.3)
Chloride: 101 mmol/L (ref 98–111)
Creatinine, Ser: 1.33 mg/dL — ABNORMAL HIGH (ref 0.44–1.00)
GFR, Estimated: 45 mL/min — ABNORMAL LOW (ref >60)
Glucose, Bld: 113 mg/dL — ABNORMAL HIGH (ref 70–99)
Potassium: 4 mmol/L (ref 3.5–5.1)
Sodium: 136 mmol/L (ref 135–145)
Total Bilirubin: 0.7 mg/dL (ref 0.3–1.2)
Total Protein: 7.7 g/dL (ref 6.5–8.1)

## 2021-05-18 LAB — I-STAT CHEM 8, ED
BUN: 34 mg/dL — ABNORMAL HIGH (ref 8–23)
Calcium, Ion: 1.21 mmol/L (ref 1.15–1.40)
Chloride: 104 mmol/L (ref 98–111)
Creatinine, Ser: 1.4 mg/dL — ABNORMAL HIGH (ref 0.44–1.00)
Glucose, Bld: 116 mg/dL — ABNORMAL HIGH (ref 70–99)
HCT: 43 % (ref 36.0–46.0)
Hemoglobin: 14.6 g/dL (ref 12.0–15.0)
Potassium: 4.1 mmol/L (ref 3.5–5.1)
Sodium: 140 mmol/L (ref 135–145)
TCO2: 29 mmol/L (ref 22–32)

## 2021-05-18 LAB — CBC
HCT: 42.5 % (ref 36.0–46.0)
Hemoglobin: 13 g/dL (ref 12.0–15.0)
MCH: 27.7 pg (ref 26.0–34.0)
MCHC: 30.6 g/dL (ref 30.0–36.0)
MCV: 90.6 fL (ref 80.0–100.0)
Platelets: 243 10*3/uL (ref 150–400)
RBC: 4.69 MIL/uL (ref 3.87–5.11)
RDW: 14.9 % (ref 11.5–15.5)
WBC: 8.5 10*3/uL (ref 4.0–10.5)
nRBC: 0 % (ref 0.0–0.2)

## 2021-05-18 MED ORDER — SODIUM CHLORIDE 0.9% FLUSH: 3.0000 mL | Freq: Once | INTRAVENOUS | Status: DC

## 2021-05-18 NOTE — ED Notes (Signed)
Patient transported to CT 

## 2021-05-18 NOTE — ED Triage Notes (Signed)
Pt c/o left side numbness and tingling that started this morning after her shower. Pt also reports gait issues. A&O x 4, no facial droop noted. Hx of same symptoms.

## 2021-05-18 NOTE — ED Provider Notes (Signed)
Emergency Medicine Provider Triage Evaluation Note  [name] , a 65 y.o. female  was evaluated in triage.  Pt complains of left hand weakness, changes in gait x12 hours.  Similar symptoms in the past with a prior CVA.  Currently not on any blood thinners.  Review of Systems  Positive: Weakness, gait abnormality Negative: Fever, headache, trauma  Physical Exam  BP (!) 200/100 (BP Location: Right Arm)   Pulse 94   Temp 98.9 F (37.2 C) (Oral)   Resp 16   LMP 11/16/2002   SpO2 100%  Gen:   Awake, no distress   Resp:  Normal effort  MSK:   Moves extremities without difficulty  Other:  Pronator drift on exam. Decrease strength to the left hand which is new since this morning, states "constantly dropping things ".  Ambulatory with steady gait.  Medical Decision Making  Medically screening exam initiated at 7:28 PM.  Appropriate orders placed.  [name] was informed that the remainder of the evaluation will be completed by another provider, this initial triage assessment does not replace that evaluation, and the importance of remaining in the ED until their evaluation is complete.     Janeece Fitting, PA-C 05/18/21 1949    Isla Pence, MD 05/18/21 [phone]

## 2021-05-18 NOTE — Progress Notes (Signed)
Transport went for pt and she refused to be brought to department. Pt stated that she is claustrophobic and she refused exam.

## 2021-05-18 NOTE — ED Notes (Signed)
Pt in CT, will obtain vitals when pt returns.

## 2021-05-19 ENCOUNTER — Inpatient Hospital Stay (HOSPITAL_COMMUNITY): Payer: Medicare Other

## 2021-05-19 ENCOUNTER — Emergency Department (HOSPITAL_COMMUNITY): Payer: Medicare Other

## 2021-05-19 DIAGNOSIS — Z90710 Acquired absence of both cervix and uterus: Secondary | ICD-10-CM | POA: Diagnosis not present

## 2021-05-19 DIAGNOSIS — I6389 Other cerebral infarction: Secondary | ICD-10-CM | POA: Diagnosis not present

## 2021-05-19 DIAGNOSIS — I6340 Cerebral infarction due to embolism of unspecified cerebral artery: Secondary | ICD-10-CM | POA: Diagnosis present

## 2021-05-19 DIAGNOSIS — F32A Depression, unspecified: Secondary | ICD-10-CM | POA: Diagnosis present

## 2021-05-19 DIAGNOSIS — Z8371 Family history of colonic polyps: Secondary | ICD-10-CM | POA: Diagnosis not present

## 2021-05-19 DIAGNOSIS — I639 Cerebral infarction, unspecified: Secondary | ICD-10-CM | POA: Diagnosis present

## 2021-05-19 DIAGNOSIS — E785 Hyperlipidemia, unspecified: Secondary | ICD-10-CM | POA: Diagnosis present

## 2021-05-19 DIAGNOSIS — N1832 Chronic kidney disease, stage 3b: Secondary | ICD-10-CM | POA: Diagnosis present

## 2021-05-19 DIAGNOSIS — I69398 Other sequelae of cerebral infarction: Secondary | ICD-10-CM | POA: Diagnosis not present

## 2021-05-19 DIAGNOSIS — Z833 Family history of diabetes mellitus: Secondary | ICD-10-CM | POA: Diagnosis not present

## 2021-05-19 DIAGNOSIS — Z79899 Other long term (current) drug therapy: Secondary | ICD-10-CM | POA: Diagnosis not present

## 2021-05-19 DIAGNOSIS — Z6841 Body Mass Index (BMI) 40.0 and over, adult: Secondary | ICD-10-CM | POA: Diagnosis not present

## 2021-05-19 DIAGNOSIS — Z803 Family history of malignant neoplasm of breast: Secondary | ICD-10-CM | POA: Diagnosis not present

## 2021-05-19 DIAGNOSIS — R201 Hypoesthesia of skin: Secondary | ICD-10-CM | POA: Diagnosis present

## 2021-05-19 DIAGNOSIS — E1122 Type 2 diabetes mellitus with diabetic chronic kidney disease: Secondary | ICD-10-CM | POA: Diagnosis present

## 2021-05-19 DIAGNOSIS — G8194 Hemiplegia, unspecified affecting left nondominant side: Secondary | ICD-10-CM | POA: Diagnosis present

## 2021-05-19 DIAGNOSIS — R29702 NIHSS score 2: Secondary | ICD-10-CM | POA: Diagnosis present

## 2021-05-19 DIAGNOSIS — Z8719 Personal history of other diseases of the digestive system: Secondary | ICD-10-CM | POA: Diagnosis not present

## 2021-05-19 DIAGNOSIS — Z905 Acquired absence of kidney: Secondary | ICD-10-CM | POA: Diagnosis not present

## 2021-05-19 DIAGNOSIS — E1142 Type 2 diabetes mellitus with diabetic polyneuropathy: Secondary | ICD-10-CM | POA: Diagnosis present

## 2021-05-19 DIAGNOSIS — Z85528 Personal history of other malignant neoplasm of kidney: Secondary | ICD-10-CM | POA: Diagnosis not present

## 2021-05-19 DIAGNOSIS — F40240 Claustrophobia: Secondary | ICD-10-CM | POA: Diagnosis present

## 2021-05-19 DIAGNOSIS — I672 Cerebral atherosclerosis: Secondary | ICD-10-CM | POA: Diagnosis present

## 2021-05-19 DIAGNOSIS — I129 Hypertensive chronic kidney disease with stage 1 through stage 4 chronic kidney disease, or unspecified chronic kidney disease: Secondary | ICD-10-CM | POA: Diagnosis present

## 2021-05-19 LAB — LIPID PANEL
Cholesterol: 215 mg/dL — ABNORMAL HIGH (ref 0–200)
HDL: 44 mg/dL (ref >40)
LDL Cholesterol: 152 mg/dL — ABNORMAL HIGH (ref 0–99)
Total CHOL/HDL Ratio: 4.9 RATIO
Triglycerides: 96 mg/dL (ref <150)
VLDL: 19 mg/dL (ref 0–40)

## 2021-05-19 LAB — ECHOCARDIOGRAM COMPLETE
Area-P 1/2: 2.66 cm2
Height: 61 in
S' Lateral: 3.2 cm
Weight: 4000 oz

## 2021-05-19 LAB — COMPREHENSIVE METABOLIC PANEL
ALT: 16 U/L (ref 0–44)
AST: 22 U/L (ref 15–41)
Albumin: 2.8 g/dL — ABNORMAL LOW (ref 3.5–5.0)
Alkaline Phosphatase: 80 U/L (ref 38–126)
Anion gap: 7 (ref 5–15)
BUN: 27 mg/dL — ABNORMAL HIGH (ref 8–23)
CO2: 28 mmol/L (ref 22–32)
Calcium: 8.9 mg/dL (ref 8.9–10.3)
Chloride: 103 mmol/L (ref 98–111)
Creatinine, Ser: 1.56 mg/dL — ABNORMAL HIGH (ref 0.44–1.00)
GFR, Estimated: 37 mL/min — ABNORMAL LOW (ref >60)
Glucose, Bld: 131 mg/dL — ABNORMAL HIGH (ref 70–99)
Potassium: 3.7 mmol/L (ref 3.5–5.1)
Sodium: 138 mmol/L (ref 135–145)
Total Bilirubin: 0.8 mg/dL (ref 0.3–1.2)
Total Protein: 6.8 g/dL (ref 6.5–8.1)

## 2021-05-19 LAB — CBC
HCT: 38.7 % (ref 36.0–46.0)
Hemoglobin: 12.3 g/dL (ref 12.0–15.0)
MCH: 28.3 pg (ref 26.0–34.0)
MCHC: 31.8 g/dL (ref 30.0–36.0)
MCV: 89.2 fL (ref 80.0–100.0)
Platelets: 241 10*3/uL (ref 150–400)
RBC: 4.34 MIL/uL (ref 3.87–5.11)
RDW: 14.7 % (ref 11.5–15.5)
WBC: 7.7 10*3/uL (ref 4.0–10.5)
nRBC: 0 % (ref 0.0–0.2)

## 2021-05-19 LAB — HEMOGLOBIN A1C
Hgb A1c MFr Bld: 7.2 % — ABNORMAL HIGH (ref 4.8–5.6)
Mean Plasma Glucose: 159.94 mg/dL

## 2021-05-19 LAB — CBG MONITORING, ED: Glucose-Capillary: 82 mg/dL (ref 70–99)

## 2021-05-19 MED ORDER — ASPIRIN 325 MG PO TABS
325.0000 mg | ORAL_TABLET | Freq: Once | ORAL | Status: AC
  Administered 2021-05-19: 325 mg via ORAL
  Filled 2021-05-19: qty 1

## 2021-05-19 MED ORDER — LABETALOL HCL 5 MG/ML IV SOLN: 5.0000 mg | INTRAVENOUS | Status: DC | PRN

## 2021-05-19 MED ORDER — INSULIN ASPART 100 UNIT/ML IJ SOLN: 0.0000 [IU] | Freq: Three times a day (TID) | INTRAMUSCULAR | Status: DC

## 2021-05-19 MED ORDER — ASPIRIN EC 81 MG PO TBEC: 81.0000 mg | DELAYED_RELEASE_TABLET | Freq: Every day | ORAL | Status: DC

## 2021-05-19 MED ORDER — ASPIRIN EC 81 MG PO TBEC
81.0000 mg | DELAYED_RELEASE_TABLET | Freq: Every day | ORAL | Status: DC
  Administered 2021-05-20: 81 mg via ORAL
  Filled 2021-05-19: qty 1

## 2021-05-19 MED ORDER — CLOPIDOGREL BISULFATE 75 MG PO TABS: 75.0000 mg | ORAL_TABLET | Freq: Every day | ORAL | Status: DC

## 2021-05-19 MED ORDER — DIAZEPAM 5 MG/ML IJ SOLN
5.0000 mg | Freq: Once | INTRAMUSCULAR | Status: AC
  Administered 2021-05-19: 5 mg via INTRAVENOUS
  Filled 2021-05-19: qty 2

## 2021-05-19 MED ORDER — SODIUM CHLORIDE 0.9 % IV SOLN: Freq: Once | INTRAVENOUS | Status: AC

## 2021-05-19 MED ORDER — CLOPIDOGREL BISULFATE 300 MG PO TABS
300.0000 mg | ORAL_TABLET | Freq: Once | ORAL | Status: AC
  Administered 2021-05-19: 300 mg via ORAL
  Filled 2021-05-19: qty 1

## 2021-05-19 MED ORDER — DIAZEPAM 5 MG/ML IJ SOLN
5.0000 mg | Freq: Once | INTRAMUSCULAR | Status: AC | PRN
  Administered 2021-05-19: 5 mg via INTRAVENOUS
  Filled 2021-05-19: qty 2

## 2021-05-19 MED ORDER — ENOXAPARIN SODIUM 40 MG/0.4ML IJ SOSY
40.0000 mg | PREFILLED_SYRINGE | INTRAMUSCULAR | Status: DC
  Administered 2021-05-19: 40 mg via SUBCUTANEOUS
  Filled 2021-05-19: qty 0.4

## 2021-05-19 MED ORDER — ATORVASTATIN CALCIUM 40 MG PO TABS
40.0000 mg | ORAL_TABLET | Freq: Every day | ORAL | Status: DC
  Administered 2021-05-19 – 2021-05-20 (×2): 40 mg via ORAL
  Filled 2021-05-19 (×2): qty 1

## 2021-05-19 MED ORDER — CLOPIDOGREL BISULFATE 75 MG PO TABS
75.0000 mg | ORAL_TABLET | Freq: Every day | ORAL | Status: DC
  Administered 2021-05-20: 75 mg via ORAL
  Filled 2021-05-19: qty 1

## 2021-05-19 NOTE — Consult Note (Signed)
Neurology Consultation  Reason for Consult: MRI brain with evidence of subcentimeter acute right lateral thalamus infarct Referring Physician: Dr. Laverta Baltimore  CC: Acute hyperesthesia of left face and left hand  History is obtained from: Chart review, Patient   HPI: [name] is a 65 y.o. female with a medical history significant for essential hypertension, hyperlipidemia, right basal ganglia intracranial hemorrhage in 2019 thought to be secondary to hypertension with residual left lower extremity hyperesthesia, type 2 diabetes mellitus, renal cell carcinoma s/p left kidney removal, and chronic kidney disease stage 3 who presented to the ED for evaluation of acute onset of left facial and left hand hyperesthesia as well as a stumbling gait following her morning shower yesterday. She states that at baseline she has burning and heat sensations from her left buttock to her left foot since her stroke two years ago but yesterday following her shower yesterday, she had acute onset of left facial, tongue, and arm burning sensations and when she stood up, she was unable to walk without holding onto furniture so she came to the hospital via private vehicle when her symptoms persisted throughout the day.   LKW: 05/18/2021 at 08:00 tpa given?: no, outside of thrombolytic therapy window IR Thrombectomy? No, presentation not consistent with LVO Modified Rankin Scale: 1-No significant post stroke disability and can perform usual duties with stroke symptoms  ROS: A complete ROS was performed and is negative except as noted in the HPI.   Past Medical History:  Diagnosis Date   Anemia    Blood transfusion 2010   associated with GI bleeding   CKD (chronic kidney disease) stage 2, GFR 60-89 ml/min    Depression    Diabetes mellitus    Diverticulosis of colon with hemorrhage 2010, 09/2011   H/O hiatal hernia    Helicobacter pylori gastritis 09/13/2013   Hx: UTI (urinary tract infection)    Hypertension     IBS (irritable bowel syndrome)    Personal history of colonic polyps    adenomas since 2010   Renal cell carcinoma 2004   lt kidney removed   Stroke ([place]) 12/2017   Past Surgical History:  Procedure Laterality Date   ABDOMINAL HYSTERECTOMY     ABDOMINAL HYSTERECTOMY     BIOPSY  10/23/2019   Procedure: BIOPSY;  Surgeon: Lavena Bullion, DO;  Location: [facility];  Service: Gastroenterology;;   CHOLECYSTECTOMY     COLONOSCOPY  12/19/2008   diverticulosis   COLONOSCOPY  11/17/2011   Procedure: COLONOSCOPY;  Surgeon: Zenovia Jarred, MD;  Location: [facility];  Service: Gastroenterology;  Laterality: N/A;   DILATION AND CURETTAGE OF UTERUS N/A 07/11/2014   Procedure: Repair of Vaginal Cuff;  Surgeon: Melina Schools, MD;  Location: [facility];  Service: Gynecology;  Laterality: N/A;   ESOPHAGOGASTRODUODENOSCOPY (EGD) WITH PROPOFOL N/A 10/23/2019   Procedure: ESOPHAGOGASTRODUODENOSCOPY (EGD) WITH PROPOFOL;  Surgeon: Lavena Bullion, DO;  Location: [facility];  Service: Gastroenterology;  Laterality: N/A;   FLEXIBLE SIGMOIDOSCOPY  12/12/2008   diverticulosis   HAND SURGERY     x5 right   HYDRADENITIS EXCISION Right 03/21/2014   Procedure: EXCISION HIDRADENITIS AXILLA;  Surgeon: Pedro Earls, MD;  Location: [facility];  Service: General;  Laterality: Right;   KNEE ARTHROSCOPY     x2 left   NEPHRECTOMY     left   TRIGGER FINGER RELEASE Left 06/23/2015   Procedure: RELEASE TRIGGER FINGER/A-1 PULLEY LEFT RING FINGER;  Surgeon: Daryll Brod, MD;  Location: [facility];  Service: Orthopedics;  Laterality: Left;   TUBAL LIGATION     UPPER GASTROINTESTINAL ENDOSCOPY  12/12/2008   gastroporesis   Family History  Problem Relation Age of Onset   Colon polyps Mother    Diabetes Maternal Grandmother    Breast cancer Maternal Grandmother    Diabetes Father    Breast cancer Daughter    Colon cancer Neg Hx    Social History:   reports that she has never smoked. She has  never used smokeless tobacco. She reports that she does not drink alcohol and does not use drugs.  Medications  Current Facility-Administered Medications:    sodium chloride flush (NS) 0.9 % injection 3 mL, 3 mL, Intravenous, Once, Cardama, Grayce Sessions, MD  Current Outpatient Medications:    albuterol (VENTOLIN HFA) 108 (90 Base) MCG/ACT inhaler, Inhale 2 puffs into the lungs every 6 (six) hours as needed for wheezing., Disp: , Rfl:    BYDUREON 2 MG PEN, Inject 2 mg into the skin every 7 (seven) days. On weekends, Disp: , Rfl:    fluticasone (FLONASE) 50 MCG/ACT nasal spray, Place 2 sprays into both nostrils daily as needed for allergies or rhinitis., Disp: , Rfl:    lisinopril-hydrochlorothiazide (ZESTORETIC) 20-12.5 MG tablet, Take 2 tablets by mouth daily., Disp: , Rfl:   Exam: Current vital signs: BP (!) 168/71 (BP Location: Right Arm)   Pulse 75   Temp 98.8 F (37.1 C) (Temporal)   Resp 16   Ht 5\' 1"  (1.549 m)   Wt 113.4 kg   LMP 11/16/2002   SpO2 100%   BMI 47.24 kg/m  Vital signs in last 24 hours: Temp:  [98.8 F (37.1 C)-98.9 F (37.2 C)] 98.8 F (37.1 C) (07/06 0926) Pulse Rate:  [62-94] 75 (07/06 0926) Resp:  [14-20] 16 (07/06 0926) BP: (114-200)/(55-100) 168/71 (07/06 0926) SpO2:  [98 %-100 %] 100 % (07/06 0926) Weight:  [113.4 kg] 113.4 kg (07/06 0927)  GENERAL: Awake, alert, standing at bedside, in no acute distress Psych: Affect appropriate for situation, calm and cooperative with examination Head: Normocephalic and atraumatic, without obvious abnormality EENT: Normal conjunctivae, dry mucous membranes, no OP obstruction LUNGS: Normal respiratory effort. Non-labored breathing on room air CV: Regular rate and rhythm on cardiac telemetry ABDOMEN: Rounded, soft, non-tender Ext: warm, well perfused, without obvious deformity  NEURO:  Mental Status: Awake, alert, and oriented to self, age, month, year, place, and situation. She is able to provide a clear and  coherent history of present illness.  Speech/Language: speech is intact without dysarthria.   Naming, repetition, fluency, and comprehension intact.  No evidence of aphasia or neglect noted.  Cranial Nerves:  II: PERRL 5 mm/brisk. Visual fields full.  III, IV, VI: EOMI without ptosis, nystagmus, or gaze preference.  V: Sensation is intact to light touch and symmetrical to face but with constant hyperesthesia noted to left cheek.   VII: Face is symmetric resting and smiling.   VIII: Hearing is intact to voice IX, X: Palate elevation is symmetric. Phonation normal.  XI: Normal sternocleidomastoid and trapezius muscle strength XII: Tongue protrudes midline without fasciculations.   Motor: 5/5 strength present in right upper and lower extremities as well as left lower extremity without vertical drift on assessment.  Left upper extremity with subtle weakness noted 4/5 strength with vertical drift on assessment.  Tone is normal. Bulk is normal.  Sensation: Intact to light touch bilaterally in all four extremities with reports of hyperesthesias on the left lower extremity (  chronic) and acute hyperesthesia on the left upper extremity. Coordination: FTN intact bilaterally. HKS intact bilaterally. DTRs: 2+ and symmetric patellae and biceps.  Gait: Deferred  NIHSS: 1a Level of Conscious.: 0 1b LOC Questions: 0 1c LOC Commands: 0 2 Best Gaze: 0 3 Visual: 0 4 Facial Palsy: 0 5a Motor Arm - left: 1 5b Motor Arm - Right: 0 6a Motor Leg - Left: 0 6b Motor Leg - Right: 0 7 Limb Ataxia: 0 8 Sensory: 1 9 Best Language: 0 10 Dysarthria: 0 11 Extinct. and Inatten.: 0 TOTAL: 2  Labs I have reviewed labs in epic and the results pertinent to this consultation are: CBC    Component Value Date/Time   WBC 8.5 05/18/2021 1933   RBC 4.69 05/18/2021 1933   HGB 14.6 05/18/2021 1957   HCT 43.0 05/18/2021 1957   PLT 243 05/18/2021 1933   MCV 90.6 05/18/2021 1933   MCH 27.7 05/18/2021 1933   MCHC  30.6 05/18/2021 1933   RDW 14.9 05/18/2021 1933   LYMPHSABS 4.0 05/18/2021 1933   MONOABS 0.7 05/18/2021 1933   EOSABS 0.2 05/18/2021 1933   BASOSABS 0.0 05/18/2021 1933   CMP     Component Value Date/Time   NA 140 05/18/2021 1957   K 4.1 05/18/2021 1957   CL 104 05/18/2021 1957   CO2 26 05/18/2021 1933   GLUCOSE 116 (H) 05/18/2021 1957   BUN 34 (H) 05/18/2021 1957   CREATININE 1.40 (H) 05/18/2021 1957   CALCIUM 9.3 05/18/2021 1933   PROT 7.7 05/18/2021 1933   ALBUMIN 3.2 (L) 05/18/2021 1933   AST 21 05/18/2021 1933   ALT 18 05/18/2021 1933   ALKPHOS 82 05/18/2021 1933   BILITOT 0.7 05/18/2021 1933   GFRNONAA 45 (L) 05/18/2021 1933   GFRAA 51 (L) 01/09/2020 1149   Lipid Panel     Component Value Date/Time   CHOL 179 01/06/2018 0320   TRIG 92 01/06/2018 0320   HDL 43 01/06/2018 0320   CHOLHDL 4.2 01/06/2018 0320   VLDL 18 01/06/2018 0320   LDLCALC 118 (H) 01/06/2018 0320   Lab Results  Component Value Date   HGBA1C 7.0 (H) 01/06/2018   Imaging I have reviewed the images obtained:  CT-scan of the brain 05/18/2021: 1. No definite CT evidence for acute intracranial abnormality. 2. Similar encephalomalacia in the right basal ganglia. Mild chronic small vessel ischemic change of the white matter.  MRI examination of the brain 05/19/2021: Subcentimeter acute infarction of the right lateral thalamus. Extensive old ischemic changes affecting the brainstem, thalami and radiating white matter tracts on the right, many with hemosiderin deposition related to old hemorrhages. No evidence of acute hemorrhage at the right lateral thalamic infarction at this time.   Assessment: 65 y.o. female who presented 05/18/2021 for evaluation of acute onset of stumbling gait and left facial, tongue, and left upper extremity hyperesthesias. Patient presented approximately 12 hours after symptom onset. CT imaging was without acute intracranial abnormality, MRI brain revealed a subcentimeter acute  infarction of the right lateral thalamus with extensive old ischemic changes. Etiology likely atherosclerotic disease with presence of extensive ischemic changes on MRI brain with hemosiderin deposition related to old hemorrhages versus cardioembolic. Stroke risk factors include obesity, essential hypertension, DM2, and CKD stage 3. Per chart review patient has been prescribed ASA 81 mg daily by neurology outpatient for stroke prophylaxis but patient denies taking this medication at home.   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 Okay to repeat diazepam 5 mg IV for claustrophobia and MR angio tolerance.  - Frequent neuro checks - Echocardiogram - Prophylactic therapy- Antiplatelet med: DAPT x 21 days Aspirin - dose 325mg  PO or 300mg  PR followed by ASA 81 mg PO daily with Clopidogrel 300 mg load followed by 75 mg PO - Risk factor modification - Telemetry monitoring - PT consult, OT consult, Speech consult - Stroke team to follow  Pt seen by NP/Neuro and later by MD. Note/plan to be edited by MD as needed.  Anibal Henderson, AGAC-NP [facility] Pager: [phone]    I have seen the patient and reviewed the above note.  She has what appears to be a small vessel ischemic infarct, will need to be admitted for secondary risk factor modification.  She has isolated left-sided hypoesthesia, but I still think she may benefit from a PT evaluation.  Further work-up as above, stroke team to follow.  Roland Rack, MD [facility] [phone]  If 7pm- 7am, please page neurology on call as listed in [place].

## 2021-05-19 NOTE — ED Provider Notes (Signed)
Blood pressure (!) 137/55, pulse 63, temperature 98.9 F (37.2 C), temperature source Oral, resp. rate 14, last menstrual period 11/16/2002, SpO2 100 %.  Assuming care from Dr. Leonette Monarch.  In short, [name] is a 65 y.o. female with a chief complaint of Numbness .  Refer to the original H&P for additional details.  The current plan of care is to f/u on MRI and ambulate.  08:30 AM  Patient's MRI shows a small area of acute infarct of the right lateral thalamus.  There is extensive old ischemic change affecting the brainstem and thalamus.  No hemorrhage.  Discussed with Dr. Leonel Ramsay who will consult.  Plan for admit. Updated patient who is in agreement with plan.   Discussed patient's case with IM teaching service to request admission. Patient and family (if present) updated with plan. Care transferred to Medicine service.   EKG Interpretation  Date/Time:  Tuesday May 18 2021 19:45:23 EDT Ventricular Rate:  82 PR Interval:  172 QRS Duration: 86 QT Interval:  368 QTC Calculation: 429 R Axis:   78 Text Interpretation: Sinus rhythm with marked sinus arrhythmia Cannot rule out Anterior infarct , age undetermined Abnormal ECG No acute changes Confirmed by Addison Lank [id number]) on 05/19/2021 4:25:17 AM         I reviewed all nursing notes, vitals, pertinent old records, EKGs, labs, imaging (as available).     Margette Fast, MD 05/19/21 [phone]

## 2021-05-19 NOTE — Progress Notes (Signed)
  Echocardiogram 2D Echocardiogram has been performed.  Juwuan Sedita G Baljit Liebert 05/19/2021, 1:51 PM

## 2021-05-19 NOTE — ED Notes (Signed)
Patient transported to MRI 

## 2021-05-19 NOTE — ED Notes (Signed)
Pt transported to MRI 

## 2021-05-19 NOTE — ED Provider Notes (Signed)
[facility] EMERGENCY DEPARTMENT Provider Note  CSN: [id number] Arrival date & time: 05/18/21 1916  Chief Complaint(s) Numbness  HPI [name] is a 65 y.o. 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  She reports that this began around 8 in the morning.  She did not seek care because she did not see any facial changes or did not feel any weakness.  She did note some mild gait instability.  No associated headache. No recent fevers or chills. No N/V. No chest pain or SOB.  The history is provided by the patient.   Past Medical History Past Medical History:  Diagnosis Date   Anemia    Blood transfusion 2010   associated with GI bleeding   CKD (chronic kidney disease) stage 2, GFR 60-89 ml/min    Depression    Diabetes mellitus    Diverticulosis of colon with hemorrhage 2010, 09/2011   H/O hiatal hernia    Helicobacter pylori gastritis 09/13/2013   Hx: UTI (urinary tract infection)    Hypertension    IBS (irritable bowel syndrome)    Personal history of colonic polyps    adenomas since 2010   Renal cell carcinoma 2004   lt kidney removed   Stroke ([place]) 12/2017   Patient Active Problem List   Diagnosis Date Noted   Gastritis and gastroduodenitis    Hiatal hernia    AKI (acute kidney injury) ([place]) 10/20/2019   Hyperlipidemia    ICH (intracerebral hemorrhage) ([place]) 01/04/2018   Sepsis ([place]) 08/06/2016   Diverticulitis 08/06/2016   CKD (chronic kidney disease) stage 2, GFR 60-89 ml/min    Diverticulitis of intestine without perforation or abscess without bleeding    Neuralgia of chest 12/30/2015   Atypical chest pain 12/01/2015   Physical exam 10/26/2015   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, without long-term current use of insulin ([place])  07/28/2015   Maxillary sinusitis, acute 01/21/2015   Strain of left pectoralis muscle 09/26/2014   Benign paroxysmal positional vertigo 10/03/2013   Nausea with vomiting 10/03/2013   Bleeding nose 12/75/1700   Helicobacter pylori gastritis 09/13/2013   Pyuria 08/20/2013   Abnormal abdominal CT scan 08/20/2013   Abdominal pain 08/16/2013   Back pain 07/31/2013   LUQ pain 07/31/2013   Laceration of hand 07/10/2013   Hidradenitis suppurativa of right axilla 05/31/2013   Renal cell carcinoma    IBS (irritable bowel syndrome)    S/p nephrectomy    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 Authorizing Provider  BYDUREON 2 MG PEN Inject 2 mg into the skin every 7 (seven) days. Weekly- Monday 01/19/18   [provider]  lisinopril-hydrochlorothiazide (ZESTORETIC) 20-12.5 MG tablet Take 2 tablets by mouth daily.    [provider]  pantoprazole (PROTONIX) 40 MG tablet Take 1 tablet (40 mg total) by mouth 2 (two) times daily. 10/23/19 12/04/19  Arrien, Jimmy Picket, MD  Past Surgical History Past Surgical History:  Procedure Laterality Date   ABDOMINAL HYSTERECTOMY     ABDOMINAL HYSTERECTOMY     BIOPSY  10/23/2019   Procedure: BIOPSY;  Surgeon: Lavena Bullion, DO;  Location: [facility];  Service: Gastroenterology;;   CHOLECYSTECTOMY     COLONOSCOPY  12/19/2008   diverticulosis   COLONOSCOPY  11/17/2011   Procedure: COLONOSCOPY;  Surgeon: Zenovia Jarred, MD;  Location: [facility];  Service: Gastroenterology;  Laterality: N/A;   DILATION AND CURETTAGE OF UTERUS N/A 07/11/2014   Procedure: Repair of  Vaginal Cuff;  Surgeon: Melina Schools, MD;  Location: [facility];  Service: Gynecology;  Laterality: N/A;   ESOPHAGOGASTRODUODENOSCOPY (EGD) WITH PROPOFOL N/A 10/23/2019   Procedure: ESOPHAGOGASTRODUODENOSCOPY (EGD) WITH PROPOFOL;  Surgeon: Lavena Bullion, DO;  Location: [facility];  Service: Gastroenterology;  Laterality: N/A;   FLEXIBLE SIGMOIDOSCOPY  12/12/2008   diverticulosis   HAND SURGERY     x5 right   HYDRADENITIS EXCISION Right 03/21/2014   Procedure: EXCISION HIDRADENITIS AXILLA;  Surgeon: Kimble Delaurentis Earls, MD;  Location: [facility];  Service: General;  Laterality: Right;   KNEE ARTHROSCOPY     x2 left   NEPHRECTOMY     left   TRIGGER FINGER RELEASE Left 06/23/2015   Procedure: RELEASE TRIGGER FINGER/A-1 PULLEY LEFT RING FINGER;  Surgeon: Daryll Brod, MD;  Location: [facility];  Service: Orthopedics;  Laterality: Left;   TUBAL LIGATION     UPPER GASTROINTESTINAL ENDOSCOPY  12/12/2008   gastroporesis   Family History Family History  Problem Relation Age of Onset   Colon polyps Mother    Diabetes Maternal Grandmother    Breast cancer Maternal Grandmother    Diabetes Father    Breast cancer Daughter    Colon cancer Neg Hx     Social History Social History   Tobacco Use   Smoking status: Never   Smokeless tobacco: Never  Vaping Use   Vaping Use: Never used  Substance Use Topics   Alcohol use: No    Alcohol/week: 0.0 standard drinks   Drug use: No   Allergies Patient has no known allergies.  Review of Systems Review of Systems All other systems are reviewed and are negative for acute change except as noted in the HPI  Physical Exam Vital Signs  I have reviewed the triage vital signs BP (!) 137/55   Pulse 63   Temp 98.9 F (37.2 C) (Oral)   Resp 14   LMP 11/16/2002   SpO2 100%   Physical Exam Vitals reviewed.  Constitutional:      General: She is not in acute distress.    Appearance: She is well-developed. She is not  diaphoretic.  HENT:     Head: Normocephalic and atraumatic.     Nose: Nose normal.  Eyes:     General: No scleral icterus.       Right eye: No discharge.        Left eye: No discharge.     Conjunctiva/sclera: Conjunctivae normal.     Pupils: Pupils are equal, round, and reactive to light.  Cardiovascular:     Rate and Rhythm: Normal rate and regular rhythm.     Heart sounds: No murmur heard.   No friction rub. No gallop.  Pulmonary:     Effort: Pulmonary effort is normal. No respiratory distress.     Breath sounds: Normal breath sounds. No stridor. No rales.  Abdominal:     General: There is  no distension.     Palpations: Abdomen is soft.     Tenderness: There is no abdominal tenderness.  Musculoskeletal:        General: No tenderness.     Cervical back: Normal range of motion and neck supple.  Skin:    General: Skin is warm and dry.     Findings: No erythema or rash.  Neurological:     Mental Status: She is alert and oriented to person, place, and time.     Comments: Mental Status:  Alert and oriented to person, place, and time.  Attention and concentration normal.  Speech clear.  Recent memory is intact  Cranial Nerves:  II Visual Fields: Intact to confrontation. Visual fields intact. III, IV, VI: Pupils equal and reactive to light and near. Full eye movement without nystagmus  V Facial Sensation: Normal. No weakness of masticatory muscles  VII: No facial weakness or asymmetry  VIII Auditory Acuity: Grossly normal  IX/X: The uvula is midline; the palate elevates symmetrically  XI: Normal sternocleidomastoid and trapezius strength  XII: The tongue is midline. No atrophy or fasciculations.   Motor System: Muscle Strength: 5/5 and symmetric in the upper and lower extremities. No pronation or drift.  Muscle Tone: Tone and muscle bulk are normal in the upper and lower extremities.  Reflexes: DTRs: 1+ and symmetrical in all four extremities. No Clonus Coordination: Intact  finger-to-nose, heel-to-shin. No tremor.  Sensation: decreased light touch and pinprick to left arm and face Gait: deferred     ED Results and Treatments Labs (all labs ordered are listed, but only abnormal results are displayed) Labs Reviewed  COMPREHENSIVE METABOLIC PANEL - Abnormal; Notable for the following components:      Result Value   Glucose, Bld 113 (*)    BUN 31 (*)    Creatinine, Ser 1.33 (*)    Albumin 3.2 (*)    GFR, Estimated 45 (*)    All other components within normal limits  I-STAT CHEM 8, ED - Abnormal; Notable for the following components:   BUN 34 (*)    Creatinine, Ser 1.40 (*)    Glucose, Bld 116 (*)    All other components within normal limits  PROTIME-INR  APTT  CBC  DIFFERENTIAL  CBG MONITORING, ED                                                                                                                         EKG  EKG Interpretation  Date/Time:  Tuesday May 18 2021 19:45:23 EDT Ventricular Rate:  82 PR Interval:  172 QRS Duration: 86 QT Interval:  368 QTC Calculation: 429 R Axis:   78 Text Interpretation: Sinus rhythm with marked sinus arrhythmia Cannot rule out Anterior infarct , age undetermined Abnormal ECG No acute changes Confirmed by Addison Lank [phone]) on 05/19/2021 4:25:17 AM        Radiology CT HEAD WO CONTRAST  Result Date: 05/18/2021 CLINICAL DATA:  Numbness EXAM: CT HEAD  WITHOUT CONTRAST TECHNIQUE: Contiguous axial images were obtained from the base of the skull through the vertex without intravenous contrast. COMPARISON:  CT 01/09/2020, 02/16/2018, 01/05/2018, 01/04/2018 FINDINGS: Brain: No acute territorial infarction, hemorrhage, or intracranial mass. Encephalomalacia within the right basal ganglia. Stable ventricle size. Mild white matter hypodensity likely chronic small vessel ischemic change. Vascular: No hyperdense vessels. Scattered carotid vascular calcification Skull: Normal. Negative for fracture or focal lesion.  Sinuses/Orbits: No acute finding. Other: None IMPRESSION: 1. No definite CT evidence for acute intracranial abnormality. 2. Similar encephalomalacia in the right basal ganglia. Mild chronic small vessel ischemic change of the white matter. Electronically Signed   By: Donavan Foil M.D.   On: 05/18/2021 21:03    Pertinent labs & imaging results that were available during my care of the patient were reviewed by me and considered in my medical decision making (see chart for details).  Medications Ordered in ED Medications  sodium chloride flush (NS) 0.9 % injection 3 mL (has no administration in time range)  0.9 %  sodium chloride infusion ( Intravenous New Bag/Given 05/19/21 0545)  diazepam (VALIUM) injection 5 mg (5 mg Intravenous Given 05/19/21 0540)                                                                                                                                    Procedures Procedures  (including critical care time)  Medical Decision Making / ED Course I have reviewed the nursing notes for this encounter and the patient's prior records (if available in EHR or on provided paperwork).   Dashley Monts was evaluated in Emergency Department on 05/19/2021 for the symptoms described in the history of present illness. She was evaluated in the context of the global COVID-19 pandemic, which necessitated consideration that the patient might be at risk for infection with the SARS-CoV-2 virus that causes COVID-19. Institutional protocols and algorithms that pertain to the evaluation of patients at risk for COVID-19 are in a state of rapid change based on information released by regulatory bodies including the CDC and federal and state organizations. These policies and algorithms were followed during the patient's care in the ED.  Left sided numbness. No weakness. CT head w/o ICH. Labs reassuring without leukocytosis or anemia.  No significant electrolyte derangements.  Renal function Will  require MRI to rule out acute stroke.  Patient care turned over to Dr Laverta Baltimore. Patient case and results discussed in detail; please see their note for further ED managment.      Final Clinical Impression(s) / ED Diagnoses Final diagnoses:  None      This chart was dictated using voice recognition software.  Despite best efforts to proofread,  errors can occur which can change the documentation meaning.    Fatima Blank, MD 05/19/21 [phone]

## 2021-05-19 NOTE — H&P (Signed)
Date: 05/19/2021               Patient Name:  [name] MRN: [id number]  DOB: 09-16-1956 Age / Sex: 65 y.o., female   PCP: Bernerd Limbo, MD         Medical Service: Internal Medicine Teaching Service         Attending Physician: Dr. Laverta Baltimore Wonda Olds, MD    First Contact: [place] Pager: [phone]   Second Contact: Tamsen Snider, MD Pager: [phone]        After Hours (After 5p/  First Contact Pager: [phone]  weekends / holidays): Second Contact Pager: [phone]   SUBJECTIVE   Chief Complaint: Left sided weakness  History of Present Illness: [name] is a 65 y.o. female with a pertinent PMH of HTN, Renal Cell Carcinoma, IBS, BPPV, T2DM, CKD Stage II, HLD, and past history of intracranial hemorrhage back in 2019 with residual left lower extremity paresthesias who presents to [facility] with left sided weakness.  Patient states that she woke up this morning with left lower extremity and left upper extremity weakness and paresthesias.  She states that the weakness made it difficult for her to ambulate. She also endorses left-sided facial paresthesias without facial droop.  Patient states that her symptoms progressed throughout the day without improvement which prompted her ED visit.  Otherwise she denies any changes in vision, dysphagia, dysarthria, chest pain, shortness of breath, abdominal pain, diarrhea or constipation.  She does have a remote history of intracranial hemorrhage thought to be secondary to hypertension with a residual left lower extremity paresthesias.  Medications: Current Meds  Medication Sig   albuterol (VENTOLIN HFA) 108 (90 Base) MCG/ACT inhaler Inhale 2 puffs into the lungs every 6 (six) hours as needed for wheezing.   BYDUREON 2 MG PEN Inject 2 mg into the skin every 7 (seven) days. On weekends   fluticasone (FLONASE) 50 MCG/ACT nasal spray Place 2 sprays into both nostrils daily as needed for allergies or rhinitis.   lisinopril-hydrochlorothiazide  (ZESTORETIC) 20-12.5 MG tablet Take 2 tablets by mouth daily.    Past Medical History:  Past Medical History:  Diagnosis Date   Anemia    Blood transfusion 2010   associated with GI bleeding   CKD (chronic kidney disease) stage 2, GFR 60-89 ml/min    Depression    Diabetes mellitus    Diverticulosis of colon with hemorrhage 2010, 09/2011   H/O hiatal hernia    Helicobacter pylori gastritis 09/13/2013   Hx: UTI (urinary tract infection)    Hypertension    IBS (irritable bowel syndrome)    Personal history of colonic polyps    adenomas since 2010   Renal cell carcinoma 2004   lt kidney removed   Stroke ([place]) 12/2017    Social:  [place] with her grandchildren  Occupation - on disability Support - family living in the area Level of function - previously independent with ADLs PCP - unknown Substance use - no ETOH, tobacco or illicit drug use  Family History: Family History  Problem Relation Age of Onset   Colon polyps Mother    Diabetes Maternal Grandmother    Breast cancer Maternal Grandmother    Diabetes Father    Breast cancer Daughter    Colon cancer Neg Hx     Allergies: Allergies as of 05/18/2021   (No Known Allergies)    Review of Systems: A complete ROS was negative except as per HPI.   OBJECTIVE:  Physical Exam: Blood pressure (!) 175/79, pulse 77, temperature 98.9 F (37.2 C), temperature source Oral, resp. rate 14, last menstrual period 11/16/2002, SpO2 99 %. Physical Exam Constitutional:      General: She is not in acute distress.    Appearance: She is obese. She is not ill-appearing.  HENT:     Head: Normocephalic and atraumatic.  Eyes:     Extraocular Movements: Extraocular movements intact.     Pupils: Pupils are equal, round, and reactive to light.  Cardiovascular:     Rate and Rhythm: Normal rate and regular rhythm.     Pulses: Normal pulses.     Heart sounds: Normal heart sounds.  Pulmonary:     Effort: Pulmonary effort  is normal.     Breath sounds: Normal breath sounds.  Abdominal:     General: Abdomen is flat. Bowel sounds are normal.  Musculoskeletal:        General: Swelling (minimalbilateral lower extremity swelling) present. Normal range of motion.     Cervical back: Normal range of motion.  Skin:    General: Skin is warm and dry.  Neurological:     Mental Status: She is alert and oriented to person, place, and time.     Cranial Nerves: No cranial nerve deficit.     Sensory: No sensory deficit.     Motor: Weakness (she had some generaliazed weakness with 4+/5 weaknees on the LUE and LLE) present.     Coordination: Coordination normal.     Deep Tendon Reflexes: Reflexes normal.  Psychiatric:        Mood and Affect: Mood normal.    Labs: CBC    Component Value Date/Time   WBC 8.5 05/18/2021 1933   RBC 4.69 05/18/2021 1933   HGB 14.6 05/18/2021 1957   HCT 43.0 05/18/2021 1957   PLT 243 05/18/2021 1933   MCV 90.6 05/18/2021 1933   MCH 27.7 05/18/2021 1933   MCHC 30.6 05/18/2021 1933   RDW 14.9 05/18/2021 1933   LYMPHSABS 4.0 05/18/2021 1933   MONOABS 0.7 05/18/2021 1933   EOSABS 0.2 05/18/2021 1933   BASOSABS 0.0 05/18/2021 1933     CMP     Component Value Date/Time   NA 140 05/18/2021 1957   K 4.1 05/18/2021 1957   CL 104 05/18/2021 1957   CO2 26 05/18/2021 1933   GLUCOSE 116 (H) 05/18/2021 1957   BUN 34 (H) 05/18/2021 1957   CREATININE 1.40 (H) 05/18/2021 1957   CALCIUM 9.3 05/18/2021 1933   PROT 7.7 05/18/2021 1933   ALBUMIN 3.2 (L) 05/18/2021 1933   AST 21 05/18/2021 1933   ALT 18 05/18/2021 1933   ALKPHOS 82 05/18/2021 1933   BILITOT 0.7 05/18/2021 1933   GFRNONAA 45 (L) 05/18/2021 1933   GFRAA 51 (L) 01/09/2020 1149    Imaging: CT HEAD WO CONTRAST 1. No definite CT evidence for acute intracranial abnormality.  2. Similar encephalomalacia in the right basal ganglia. Mild chronic small vessel ischemic change of the white matter.   MR BRAIN WO  CONTRAST Subcentimeter acute infarction of the right lateral thalamus. Extensive old ischemic changes affecting the brainstem, thalami and radiating white matter tracts on the right, many with hemosiderin deposition related to old hemorrhages.   EKG: personally reviewed my interpretation is sinus rhythm  ASSESSMENT & PLAN:   Active Problems:   * No active hospital problems. *   Merian Wroe is a 65 y.o. with pertinent PMH of TN, Renal Cell Carcinoma, IBS, BPPV,  T2DM, CKD Stage II, HLD, and past history of intracranial hemorrhage back in 2019 with residual left lower extremity paresthesias  who presented with left-sided weakness and admit for acute infarct on hospital day 0  #Acute infarct of right lateral thalamus -Has minimal left-sided weakness and paresthesias.  Was given aspirin 325 mg and will likely need to start dual antiplatelet therapy.  We will start atorvastatin 40 mg pending lipid panel.  Further stroke evaluation pending including echo and CT a of head and neck. -ASA 325 then 81 mg daily -Likely will need Plavix 75 mg daily pending neuro recommendations -We will start atorvastatin 40 mg daily -Lipid panel and A1c pending -Echocardiogram pending -CTA head and neck pending -We will keep patient on cardiac telemetry -Every 4 hour neurochecks for the first 24 hours -Patient passed bedside swallow and therefore will start heart healthy diet -Permissive hypertension with SBP goal of 160 to 180.  As needed labetalol ordered  #Type 2 diabetes Patient has history of diabetes.  Her last A1c was 8.1 on 04/2021.  She is on Bydureon every week.  She will likely need tighter control of her blood sugar with a A1c goal of less than 7. -We will start sliding scale insulin -She will likely need adjustments to her outpatient diabetic regimen  #CKD stage III Patient's kidney function is at baseline with a creatinine of 1.33 and GFR of 45.  -Avoid nephrotoxic medications when  possible  #Hyperlipidemia #Hypertension Patient has a history of hypertension on lisinopril hydrochlorothiazide 20-12.5 mg 2 tablets daily.  We will hold blood pressure medications at this time due to permissive hypertension and add as needed labetalol for SBP's greater than 200 and deep BPs greater than 100.  We will start atorvastatin.  Will titrate dose depending on lipid panel. -Labetalol 5 mg IV as needed SBP/DBP parameters -Atorvastatin 40 mg daily -Lipid panel pending   Diet: Heart Healthy VTE: Enoxaparin IVF: None,None Code: Full  Anticipated Discharge Location: Home Barriers to Discharge: stoke work up  Dispo: Admit patient to Inpatient with expected length of stay greater than 2 midnights.   Lawerance Cruel, D.O.  Internal Medicine Resident, PGY-3 [facility]  Pager: [phone] 8:59 AM, 05/19/2021   Please contact the on call pager after 5 pm and on weekends at [phone].

## 2021-05-20 ENCOUNTER — Encounter (HOSPITAL_COMMUNITY): Payer: Self-pay | Admitting: Internal Medicine

## 2021-05-20 DIAGNOSIS — I639 Cerebral infarction, unspecified: Secondary | ICD-10-CM

## 2021-05-20 LAB — GLUCOSE, CAPILLARY
Glucose-Capillary: 119 mg/dL — ABNORMAL HIGH (ref 70–99)
Glucose-Capillary: 186 mg/dL — ABNORMAL HIGH (ref 70–99)

## 2021-05-20 LAB — CBG MONITORING, ED: Glucose-Capillary: 110 mg/dL — ABNORMAL HIGH (ref 70–99)

## 2021-05-20 MED ORDER — ASPIRIN 81 MG PO TBEC: 81.0000 mg | DELAYED_RELEASE_TABLET | Freq: Every day | ORAL | 0 refills | Status: DC

## 2021-05-20 MED ORDER — CLOPIDOGREL BISULFATE 75 MG PO TABS: 75.0000 mg | ORAL_TABLET | Freq: Every day | ORAL | 0 refills | Status: DC

## 2021-05-20 MED ORDER — ATORVASTATIN CALCIUM 80 MG PO TABS: 80.0000 mg | ORAL_TABLET | Freq: Every day | ORAL | Status: DC

## 2021-05-20 MED ORDER — ATORVASTATIN CALCIUM 80 MG PO TABS: 80.0000 mg | ORAL_TABLET | Freq: Every day | ORAL | 0 refills | Status: DC

## 2021-05-20 NOTE — Plan of Care (Signed)
Min assist with adls. Independent on multiple tasks.

## 2021-05-20 NOTE — Progress Notes (Signed)
STROKE TEAM PROGRESS NOTE   INTERVAL HISTORY Patient presented with left face and hand numbness and MRI scan shows small right lateral thalamic lacunar infarct and a remote age large right basal ganglia infarct as well.  Echocardiogram shows ejection fraction of 60 to 65%.  Hemoglobin A1c is elevated at 7.2 and LDL cholesterol is 152 mg percent.  She states she is doing better today hemiparesthesias in the left side appear improved  Vitals:   05/20/21 0000 05/20/21 0254 05/20/21 0312 05/20/21 0752  BP: 139/83 (!) 173/82 (!) 173/93 (!) 172/71  Pulse: 90 94 91 71  Resp: 17 16 19 20   Temp:  98.6 F (37 C) 98.1 F (36.7 C) 98.1 F (36.7 C)  TempSrc:  Oral Oral Oral  SpO2: 99% 100%  100%  Weight:      Height:       CBC:  Recent Labs  Lab 05/18/21 1933 05/18/21 1957 05/19/21 2232  WBC 8.5  --  7.7  NEUTROABS 3.6  --   --   HGB 13.0 14.6 12.3  HCT 42.5 43.0 38.7  MCV 90.6  --  89.2  PLT 243  --  850   Basic Metabolic Panel:  Recent Labs  Lab 05/18/21 1933 05/18/21 1957 05/19/21 2232  NA 136 140 138  K 4.0 4.1 3.7  CL 101 104 103  CO2 26  --  28  GLUCOSE 113* 116* 131*  BUN 31* 34* 27*  CREATININE 1.33* 1.40* 1.56*  CALCIUM 9.3  --  8.9   Lipid Panel:  Recent Labs  Lab 05/19/21 2232  CHOL 215*  TRIG 96  HDL 44  CHOLHDL 4.9  VLDL 19  LDLCALC 152*   HgbA1c:  Recent Labs  Lab 05/19/21 2232  HGBA1C 7.2*   Urine Drug Screen: No results for input(s): LABOPIA, COCAINSCRNUR, LABBENZ, AMPHETMU, THCU, LABBARB in the last 168 hours.  Alcohol Level No results for input(s): ETH in the last 168 hours.   MR ANGIO NECK WO CONTRAST Result Date: 05/19/2021 IMPRESSION: Negative noncontrast MR angiography of the neck vessels.   MRA head Result date 05/19/2021 Segmental occlusion of a mid-to-distal right M2 MCA vessel. This may be chronic given the adjacent chronic right subinsular ischemia demonstrated on the same-day brain MRI.   Additional intracranial atherosclerotic  disease with multifocal intracranial arterial stenoses, most notably as follows.   Moderate stenosis within a proximal left M2 MCA vessel.   Up to moderate stenosis within the V4 left vertebral artery.  MRI Brain Result Date: 05/19/2021 IMPRESSION: Subcentimeter acute infarction of the right lateral thalamus. Extensive old ischemic changes affecting the brainstem, thalami and radiating white matter tracts on the right, many with hemosiderin deposition related to old hemorrhages. No evidence of acute hemorrhage at the right lateral thalamic infarction at this time.  CT head Result Date: 05/18/2021 IMPRESSION: 1. No definite CT evidence for acute intracranial abnormality. 2. Similar encephalomalacia in the right basal ganglia. Mild chronic small vessel ischemic change of the white matter.    ECHOCARDIOGRAM COMPLETE Result Date: 05/19/2021 IMPRESSIONS   1. Left ventricular ejection fraction, by estimation, is 60 to 65%. The left ventricle has normal function. The left ventricle has no regional wall motion abnormalities. There is mild concentric left ventricular hypertrophy. Left ventricular diastolic parameters are indeterminate.   2. Right ventricular systolic function is normal. The right ventricular size is normal.   3. A small pericardial effusion is present. The pericardial effusion is circumferential.   4. The mitral valve is  grossly normal. Trivial mitral valve regurgitation. No evidence of mitral stenosis.   5. The aortic valve is grossly normal. There is mild calcification of the aortic valve. Aortic valve regurgitation is trivial. Mild aortic valve sclerosis is present, with no evidence of aortic valve stenosis.   6. The inferior vena cava is dilated in size with >50% respiratory variability, suggesting right atrial pressure of 8 mmHg.   PHYSICAL EXAM Obese middle-aged African-American lady not in distress. . Afebrile. Head is nontraumatic. Neck is supple without bruit.    Cardiac  exam no murmur or gallop. Lungs are clear to auscultation. Distal pulses are well felt.  Neurological Exam :  Awake  Alert oriented x 3. Normal speech and language.eye movements full without nystagmus.fundi were not visualized. Vision acuity and fields appear normal. Hearing is normal. Palatal movements are normal. Face symmetric. Tongue midline. Normal strength, tone, reflexes and coordination.  Diminished left face and upper extremity paresthesias.  Sensation. Gait deferred.  ASSESSMENT/PLAN Ms. Bobie Caris is a 65 y.o. female with history of HTN, HLD, right BG ICH (2019), renal cell carcinoma and CKD stage 3 who presented to the ED with left facial and left hand hyperesthesia and gait instability.  MRI showed acute right lateral thalamus infarct.  NIH stroke scale 1.  Premorbid modified Rankin score 0  Stroke:  right lateral thalamus infarct embolic likely secondary to small vessel disease. CT head No definite evidence of acute intracranial abnormality MRI  subcentimeter acute infarction of the right lateral thalamus MRA  head: mid-to-distal right M2 MCA vessel occlusion. Moderate stenosis within the proximal left M2 MCA MRA neck: negative 2D Echo EF 60-65%, mild left ventricular hypertrophy LDL 152 HgbA1c 7.2 VTE prophylaxis - Lovenox sq Diet: Heart healthy No antithrombotic prior to admission, now on aspirin 81 mg daily and clopidogrel 75 mg daily. Recommend DAPT with aspirin 81mg  and clopidogrel 75mg  daily for 3 weeks then aspirin alone. Therapy recommendations:  PT no follow up needed Disposition:  pending  Hypertension Home meds:  lisinopril-hydrochlorothiazied 20-12.5mg  daily Stable Permissive hypertension (OK if < 220/120) but gradually normalize in 5-7 days Long-term BP goal normotensive  Hyperlipidemia Home meds:  none LDL 152, goal < 70 Add atorvastatin 80mg  daily  Continue statin at discharge  Diabetes type II Controlled Home meds:  none HgbA1c 7.2, goal <  7.0 CBGs Recent Labs    05/19/21 1715 05/20/21 0011 05/20/21 0605  GLUCAP 82 110* 119*    SSI  Chronic Kidney disease stage 3 Current creatinine 1.57  Other Stroke Risk Factors Obesity, Body mass index is 47.24 kg/m., BMI >/= 30 associated with increased stroke risk, recommend weight loss, diet and exercise as appropriate  Hx stroke: right basal ganglia ICH in 2019 secondary to HTN with residual let lower extremity hyperesthesia  Other Active Problems Renal cell carcinoma s/p left kidney removal Depression  Hospital day # [place], ACNP-BC 05/20/2021 12:30pm I have personally obtained history,examined this patient, reviewed notes, independently viewed imaging studies, participated in medical decision making and plan of care.ROS completed by me personally and pertinent positives fully documented  I have made any additions or clarifications directly to the above note. Agree with note above.  She presented with left face and arm paresthesias likely family small vessel disease related right thalamic lacunar infarct.  Recommend aspirin Plavix for 3 weeks followed by aspirin alone and aggressive risk factor modification.  Patient also appears to be at risk for sleep apnea and make consider possible participation with  sleep smart study if interested.  She was given written information to review and decide.  Greater than 50% time during this 35-minute visit was spent on counseling and coordination of care and discussion with care team.  Antony Contras, MD Medical Director [place] Pager: [phone] 05/20/2021 4:18 PM  To contact Stroke Continuity provider, please refer to [email or web address] After hours, contact General Neurology

## 2021-05-20 NOTE — Evaluation (Signed)
Physical Therapy Evaluation Patient Details Name: [name] MRN: [id number] DOB: May 16, 1956 Today's Date: 05/20/2021   History of Present Illness  Pt is a 65 y/o female admitted secondary to hyperesthesia of the L face and hand with initial gait instability. MRI of the brain revealed an acute R lateral thalamus infarct. PMH including but not limited to HTN, Renal Cell Carcinoma, IBS, BPPV, T2DM, CKD Stage II, HLD, and past history of intracranial hemorrhage in 2019 with residual left lower extremity paresthesias.   Clinical Impression  Pt presented supine in bed with HOB elevated, awake and willing to participate in therapy session. Prior to admission, pt reported that she was independent with all functional mobility and ADLs. Pt lives with her 65 y/o grandson in a single level home with a level entrance. At the time of evaluation, pt was independent with bed mobility, transfers and toileting. She also tolerated hallway ambulation without use of an AD with PT providing min guard for safety due to mild instability and lateral drifting. PT will continue to follow pt acutely to progress mobility as tolerated and to ensure a safe d/c home.     Follow Up Recommendations No PT follow up    Equipment Recommendations  None recommended by PT    Recommendations for Other Services       Precautions / Restrictions Precautions Precautions: None Restrictions Weight Bearing Restrictions: No      Mobility  Bed Mobility Overal bed mobility: Independent                  Transfers Overall transfer level: Independent                  Ambulation/Gait Ambulation/Gait assistance: Min guard Gait Distance (Feet): 100 Feet Assistive device: None Gait Pattern/deviations: Step-through pattern;Decreased stride length;Drifts right/left Gait velocity: slightly decreased   General Gait Details: pt with mild instability and lateral drifting, getting very close to objects on either  side but never bumping into anything. No overt LOB or need for physical assistance  Stairs            Wheelchair Mobility    Modified Rankin (Stroke Patients Only) Modified Rankin (Stroke Patients Only) Pre-Morbid Rankin Score: No significant disability Modified Rankin: Moderate disability     Balance Overall balance assessment: Needs assistance Sitting-balance support: Feet supported Sitting balance-Leahy Scale: Good     Standing balance support: During functional activity;No upper extremity supported Standing balance-Leahy Scale: Good                               Pertinent Vitals/Pain Pain Assessment: Faces Faces Pain Scale: Hurts a little bit Pain Location: tactile input to L UE/LE Pain Descriptors / Indicators: Burning Pain Intervention(s): Monitored during session    Home Living Family/patient expects to be discharged to:: Private residence Living Arrangements: Other relatives Available Help at Discharge: Family;Available PRN/intermittently   Home Access: Level entry     Home Layout: One level Home Equipment: None      Prior Function Level of Independence: Independent               Hand Dominance   Dominant Hand: Right    Extremity/Trunk Assessment   Upper Extremity Assessment Upper Extremity Assessment: Defer to OT evaluation;Overall [facility] for tasks assessed    Lower Extremity Assessment Lower Extremity Assessment: Overall WFL for tasks assessed       Communication   Communication: No difficulties  Cognition Arousal/Alertness: Awake/alert Behavior During Therapy: WFL for tasks assessed/performed Overall Cognitive Status: Within Functional Limits for tasks assessed                                 General Comments: cognition not formally assessed but [facility] for general conversation      General Comments      Exercises     Assessment/Plan    PT Assessment Patient needs continued PT services  PT Problem  List Decreased balance;Decreased mobility;Decreased coordination       PT Treatment Interventions DME instruction;Gait training;Stair training;Functional mobility training;Therapeutic activities;Therapeutic exercise;Balance training;Neuromuscular re-education;Patient/family education    PT Goals (Current goals can be found in the Care Plan section)  Acute Rehab PT Goals Patient Stated Goal: "go home" PT Goal Formulation: With patient Time For Goal Achievement: 06/03/21 Potential to Achieve Goals: Good    Frequency Min 3X/week   Barriers to discharge        Co-evaluation               AM-PAC PT "6 Clicks" Mobility  Outcome Measure Help needed turning from your back to your side while in a flat bed without using bedrails?: None Help needed moving from lying on your back to sitting on the side of a flat bed without using bedrails?: None Help needed moving to and from a bed to a chair (including a wheelchair)?: None Help needed standing up from a chair using your arms (e.g., wheelchair or bedside chair)?: None Help needed to walk in hospital room?: None Help needed climbing 3-5 steps with a railing? : A Little 6 Click Score: 23    End of Session   Activity Tolerance: Patient tolerated treatment well Patient left: in bed;with call bell/phone within reach;Other (comment) (seated upright at EOB to eat breakfast) Nurse Communication: Mobility status PT Visit Diagnosis: Other abnormalities of gait and mobility (R26.89)    Time: 8101-7510 PT Time Calculation (min) (ACUTE ONLY): 15 min   Charges:   PT Evaluation $PT Eval Low Complexity: 1 Low          Eduard Clos, PT, DPT  Acute Rehabilitation Services Office [place] 05/20/2021, 9:01 AM

## 2021-05-20 NOTE — Discharge Summary (Signed)
Name: [name] MRN: [id number] DOB: [birth year] 65 y.o. PCP: Monica Limbo, MD  Date of Admission: 05/18/2021  7:18 PM Date of Discharge: 05/20/2021 Attending Physician: Velna Ochs R  Discharge Diagnosis: 1. Cerebral vascular accident (Right Lateral Thalamus ischemic infarct)  Discharge Medications: Allergies as of 05/20/2021   No Known Allergies      Medication List     TAKE these medications    albuterol 108 (90 Base) MCG/ACT inhaler Commonly known as: VENTOLIN HFA Inhale 2 puffs into the lungs every 6 (six) hours as needed for wheezing.   aspirin 81 MG EC tablet Take 1 tablet (81 mg total) by mouth daily. Swallow whole. Start taking on: May 21, 2021   atorvastatin 80 MG tablet Commonly known as: LIPITOR Take 1 tablet (80 mg total) by mouth daily. Start taking on: May 21, 2021   Bydureon 2 MG Pen Generic drug: Exenatide ER Inject 2 mg into the skin every 7 (seven) days. On weekends   clopidogrel 75 MG tablet Commonly known as: PLAVIX Take 1 tablet (75 mg total) by mouth daily. Start taking on: May 21, 2021   fluticasone 50 MCG/ACT nasal spray Commonly known as: FLONASE Place 2 sprays into both nostrils daily as needed for allergies or rhinitis.   lisinopril-hydrochlorothiazide 20-12.5 MG tablet Commonly known as: ZESTORETIC Take 2 tablets by mouth daily.        Disposition and follow-up:   Monica.[name] was discharged from [facility] in Stable condition.  At the hospital follow up visit please address:  1.  CVA: DAPT therapy for 21 days, then ASA alone.         Modifiable risk factors: LDL 152, atorvastatin 80 mg, check lipid panel in 6 weeks, goal < 70. Allowed permissive HTN, restarted home antihypertensive at discharge, titrate blood pressures medications to goal <130/80. A1C 7.2, continue home Exenatide at discharge.        Discharge neurological deficits: Baseline LLE hyperesthesia, new LUE and V2 CN  distrubution hyperesthesia   2.  Labs / imaging needed at time of follow-up: none  3.  Pending labs/ test needing follow-up: Lipid panel in 6 weeks  Follow-up Appointments:  Follow-up Information     Monica Limbo, MD Follow up in 1 week(s).   Specialty: Family Medicine Contact information: [address] [place] [address] [phone]                 Hospital Course by problem list: 1.[name] is 65 y/o female with prior R basal ganglia hemorrhagic stroke in 2019, DM, HTN, HLD, CKD III, RCC s/p L nephrectomy admitted after acute R thalamic infarct, presenting with L face and hand hyperesthesia, following initial gait instability. Pt w/ LLE hyperesthesia at baseline following 2019 hem stroke.  CT Head not diagnostic. MRI showed sub-cm right lateral thalamic infarct and general atherosclerotic disease Additional work up did not reveal cardiogenic or carotid source. Received loading doses of ASA and Plavix, followed by maintenance 81mg  ASA and 75mg  Plavix, intended for 21 days followed by 81mg  ASA alone. Also started Lipitor for decreased stroke risk and LDL of 151. Pt's initial weakness improved over hospital stay, was evaluated by PT and felt not to need ongoing PT after discharge. Pt continues to have some L facial and LUE hyperesthesia, but is otherwise back to baseline.  Discharging with ASA, Plavix, Lipitor and resuming Zestoretic and Bydureon.   Discharge Exam:   BP (!) 163/98 (BP Location: Left Arm)  Pulse 92   Temp 98 F (36.7 C)   Resp 20   Ht 5\' 1"  (1.549 m)   Wt 113.4 kg   LMP 11/16/2002   SpO2 98%   BMI 47.24 kg/m  Discharge exam:  General: Pleasant, well-appearing middle-age woman sitting at side of bed eating breakfast. No acute distress. Head: Normocephalic. Atraumatic. CV: RRR. No murmurs, rubs, or gallops. No LE edema Pulmonary: Lungs CTAB. Normal effort. No wheezing or rales. Abdominal: Soft, nontender, nondistended.  Normal bowel sounds. Extremities: Palpable pulses. Normal ROM. Skin: Warm and dry. No obvious rash or lesions. Neuro: A&Ox3. Reported hyperesthesia in LLE, LUE and V2 CN distribution. Moves all extremities. Normal strength and reflexes. No focal deficit. Psych: Normal mood and affect.  Pertinent Labs, Studies, and Procedures:  CBC Latest Ref Rng & Units 05/19/2021 05/18/2021 05/18/2021  WBC 4.0 - 10.5 K/uL 7.7 - 8.5  Hemoglobin 12.0 - 15.0 g/dL 12.3 14.6 13.0  Hematocrit 36.0 - 46.0 % 38.7 43.0 42.5  Platelets 150 - 400 K/uL 241 - 243   BMP Latest Ref Rng & Units 05/19/2021 05/18/2021 05/18/2021  Glucose 70 - 99 mg/dL 131(H) 116(H) 113(H)  BUN 8 - 23 mg/dL 27(H) 34(H) 31(H)  Creatinine 0.44 - 1.00 mg/dL 1.56(H) 1.40(H) 1.33(H)  Sodium 135 - 145 mmol/L 138 140 136  Potassium 3.5 - 5.1 mmol/L 3.7 4.1 4.0  Chloride 98 - 111 mmol/L 103 104 101  CO2 22 - 32 mmol/L 28 - 26  Calcium 8.9 - 10.3 mg/dL 8.9 - 9.3   CT HEAD WO CONTRAST  Result Date: 05/18/2021 CLINICAL DATA:  Numbness EXAM: CT HEAD WITHOUT CONTRAST TECHNIQUE: Contiguous axial images were obtained from the base of the skull through the vertex without intravenous contrast. COMPARISON:  CT 01/09/2020, 02/16/2018, 01/05/2018, 01/04/2018 FINDINGS: Brain: No acute territorial infarction, hemorrhage, or intracranial mass. Encephalomalacia within the right basal ganglia. Stable ventricle size. Mild white matter hypodensity likely chronic small vessel ischemic change. Vascular: No hyperdense vessels. Scattered carotid vascular calcification Skull: Normal. Negative for fracture or focal lesion. Sinuses/Orbits: No acute finding. Other: None IMPRESSION: 1. No definite CT evidence for acute intracranial abnormality. 2. Similar encephalomalacia in the right basal ganglia. Mild chronic small vessel ischemic change of the white matter. Electronically Signed   By: Donavan Foil M.D.   On: 05/18/2021 21:03   MR ANGIO HEAD WO CONTRAST  Result Date:  05/19/2021 CLINICAL DATA:  Stroke, follow-up. EXAM: MRA HEAD WITHOUT CONTRAST TECHNIQUE: Angiographic images of the Circle of Willis were acquired using MRA technique without intravenous contrast. COMPARISON:  Noncontrast brain MRI performed earlier today 05/19/2021. Noncontrast head CT 05/18/2021. FINDINGS: Anterior circulation: The intracranial internal carotid arteries are patent. Mild atherosclerotic irregularity of both vessels without stenosis. The M1 middle cerebral arteries are patent. Segmental occlusion of a mid to distal right M2 MCA vessel (series 1050, image 16). This may be chronic given the adjacent chronic right subinsular ischemia demonstrated on the same-day brain MRI. Moderate stenosis within a proximal M2 left MCA vessel (series 1050, image 5). The right anterior cerebral artery is diminutive beyond the proximal A2 segment. The left A2/A3 and more distal left anterior cerebral artery is significantly dominant. No intracranial aneurysm is identified. Posterior circulation: The intracranial vertebral arteries are patent. Atherosclerotic irregularity of the V4 left vertebral artery with up to moderate stenosis. The basilar artery is patent. The posterior cerebral arteries are patent. Atherosclerotic irregularity of the left posterior cerebral artery without high-grade proximal stenosis. Predominantly fetal origin left posterior cerebral  artery. A right posterior communicating artery is present. Anatomic variants: None significant IMPRESSION: Segmental occlusion of a mid-to-distal right M2 MCA vessel. This may be chronic given the adjacent chronic right subinsular ischemia demonstrated on the same-day brain MRI. Additional intracranial atherosclerotic disease with multifocal intracranial arterial stenoses, most notably as follows. Moderate stenosis within a proximal left M2 MCA vessel. Up to moderate stenosis within the V4 left vertebral artery. Electronically Signed   By: Kellie Simmering DO   On:  05/19/2021 12:25   MR ANGIO NECK WO CONTRAST  Result Date: 05/19/2021 CLINICAL DATA:  Follow-up hemorrhagic stroke with left lower extremity numbness. Progressive numbness now involving the left upper extremity and face. Acute right thalamic infarction. EXAM: MRA NECK WITHOUT CONTRAST TECHNIQUE: Angiographic images of the neck were acquired using MRA technique without intravenous contrast. Carotid stenosis measurements (when applicable) are obtained utilizing NASCET criteria, using the distal internal carotid diameter as the denominator. COMPARISON:  MRI same day FINDINGS: Aortic arch: Poorly seen without contrast.  No visible abnormality. Right carotid system: Common carotid artery widely patent to the bifurcation. Carotid bifurcation appears normal without stenosis or irregularity. Cervical ICA patent to the skull base. Left carotid system: Common carotid artery widely patent to the bifurcation. Carotid bifurcation appears normal without stenosis or irregularity. Cervical ICA patent to the skull base. Vertebral arteries: Normal appearance through the cervical region to the foramen magnum. Other: None. IMPRESSION: Negative noncontrast MR angiography of the neck vessels. Electronically Signed   By: Nelson Chimes M.D.   On: 05/19/2021 12:30   MR BRAIN WO CONTRAST  Result Date: 05/19/2021 CLINICAL DATA:  Left-sided numbness and tingling beginning yesterday. EXAM: MRI HEAD WITHOUT CONTRAST TECHNIQUE: Multiplanar, multiecho pulse sequences of the brain and surrounding structures were obtained without intravenous contrast. COMPARISON:  Head CT 05/18/2021 FINDINGS: Brain: Diffusion imaging shows a subcentimeter acute infarction in the lateral right thalamus. No other acute abnormality. Chronic small-vessel ischemic changes affect the pons. Few tiny small-vessel old cerebellar infarctions. Cerebral hemispheres show old small vessel infarctions of the thalami and the radiating white matter tracts on the right. Moderate  chronic small-vessel change of the hemispheric white matter elsewhere. No cortical or large vessel territory infarction. No mass lesion, evidence of acute hemorrhage, hydrocephalus or extra-axial collection. Punctate foci of hemosiderin deposition associated with many of the old deep brain infarctions. More extensive hemosiderin deposition in the region of the radiating white matter infarction on the right. Vascular: Major vessels at the base of the brain show flow. Skull and upper cervical spine: Negative Sinuses/Orbits: Clear/normal Other: None IMPRESSION: Subcentimeter acute infarction of the right lateral thalamus. Extensive old ischemic changes affecting the brainstem, thalami and radiating white matter tracts on the right, many with hemosiderin deposition related to old hemorrhages. No evidence of acute hemorrhage at the right lateral thalamic infarction at this time. Electronically Signed   By: Nelson Chimes M.D.   On: 05/19/2021 08:05   ECHOCARDIOGRAM COMPLETE  Result Date: 05/19/2021    ECHOCARDIOGRAM REPORT   Patient Name:   GURPREET MARIANI Date of Exam: 05/19/2021 Medical Rec #:  [id number]          Height:       61.0 in Accession #:    [id number]         Weight:       250.0 lb Date of Birth:  1956-08-19         BSA:          2.077 m Patient Age:  64 years           BP:           171/75 mmHg Patient Gender: F                  HR:           76 bpm. Exam Location:  Inpatient Procedure: 2D Echo, Cardiac Doppler and Color Doppler Indications:    Stroke I63.9  History:        Patient has prior history of Echocardiogram examinations, most                 recent 01/05/2018. Risk Factors:Diabetes and Hypertension. CKD.                 Cancer.  Sonographer:    Jonelle Sidle Dance Referring Phys: [id number] [place]  1. Left ventricular ejection fraction, by estimation, is 60 to 65%. The left ventricle has normal function. The left ventricle has no regional wall motion abnormalities. There is mild  concentric left ventricular hypertrophy. Left ventricular diastolic parameters are indeterminate.  2. Right ventricular systolic function is normal. The right ventricular size is normal.  3. A small pericardial effusion is present. The pericardial effusion is circumferential.  4. The mitral valve is grossly normal. Trivial mitral valve regurgitation. No evidence of mitral stenosis.  5. The aortic valve is grossly normal. There is mild calcification of the aortic valve. Aortic valve regurgitation is trivial. Mild aortic valve sclerosis is present, with no evidence of aortic valve stenosis.  6. The inferior vena cava is dilated in size with >50% respiratory variability, suggesting right atrial pressure of 8 mmHg. Comparison(s): No significant change from prior study. Conclusion(s)/Recommendation(s): No intracardiac source of embolism detected on this transthoracic study. A transesophageal echocardiogram is recommended to exclude cardiac source of embolism if clinically indicated. FINDINGS  Left Ventricle: Left ventricular ejection fraction, by estimation, is 60 to 65%. The left ventricle has normal function. The left ventricle has no regional wall motion abnormalities. The left ventricular internal cavity size was normal in size. There is  mild concentric left ventricular hypertrophy. Left ventricular diastolic parameters are indeterminate. Right Ventricle: The right ventricular size is normal. Right vetricular wall thickness was not well visualized. Right ventricular systolic function is normal. Left Atrium: Left atrial size was normal in size. Right Atrium: Right atrial size was normal in size. Pericardium: A small pericardial effusion is present. The pericardial effusion is circumferential. Presence of pericardial fat pad. Mitral Valve: The mitral valve is grossly normal. Trivial mitral valve regurgitation. No evidence of mitral valve stenosis. Tricuspid Valve: The tricuspid valve is grossly normal. Tricuspid valve  regurgitation is trivial. No evidence of tricuspid stenosis. Aortic Valve: The aortic valve is grossly normal. There is mild calcification of the aortic valve. Aortic valve regurgitation is trivial. Mild aortic valve sclerosis is present, with no evidence of aortic valve stenosis. Pulmonic Valve: The pulmonic valve was not well visualized. Pulmonic valve regurgitation is not visualized. Aorta: The aortic root, ascending aorta and aortic arch are all structurally normal, with no evidence of dilitation or obstruction. Venous: The inferior vena cava is dilated in size with greater than 50% respiratory variability, suggesting right atrial pressure of 8 mmHg. IAS/Shunts: The atrial septum is grossly normal.  LEFT VENTRICLE PLAX 2D LVIDd:         4.60 cm  Diastology LVIDs:         3.20 cm  LV e' medial:  5.87 cm/s LV PW:         1.30 cm  LV E/e' medial:  7.5 LV IVS:        1.35 cm  LV e' lateral:   6.74 cm/s LVOT diam:     1.90 cm  LV E/e' lateral: 6.5 LV SV:         53 LV SV Index:   26 LVOT Area:     2.84 cm  RIGHT VENTRICLE             IVC RV Basal diam:  2.50 cm     IVC diam: 2.10 cm RV S prime:     11.10 cm/s TAPSE (M-mode): 1.7 cm LEFT ATRIUM             Index       RIGHT ATRIUM           Index LA diam:        4.20 cm 2.02 cm/m  RA Area:     11.40 cm LA Vol (A2C):   23.5 ml 11.32 ml/m RA Volume:   21.90 ml  10.55 ml/m LA Vol (A4C):   26.9 ml 12.95 ml/m LA Biplane Vol: 26.2 ml 12.62 ml/m  AORTIC VALVE LVOT Vmax:   74.60 cm/s LVOT Vmean:  52.300 cm/s LVOT VTI:    0.187 m  AORTA Ao Root diam: 3.00 cm Ao Asc diam:  2.80 cm MITRAL VALVE MV Area (PHT): 2.66 cm    SHUNTS MV Decel Time: 285 msec    Systemic VTI:  0.19 m MV E velocity: 44.10 cm/s  Systemic Diam: 1.90 cm MV A velocity: 73.30 cm/s MV E/A ratio:  0.60 Buford Dresser MD Electronically signed by Buford Dresser MD Signature Date/Time: 05/19/2021/5:45:20 PM    Final     Discharge Instructions: Discharge Instructions     Diet - low sodium  heart healthy   Complete by: As directed    Increase activity slowly   Complete by: As directed        Signed: Madalyn Rob, MD 05/20/2021, 6:51 PM   Pager: [phone]

## 2021-05-20 NOTE — Hospital Course (Addendum)
[name] is 65 y/o female with prior R basal ganglia hemorrhagic stroke in 2019, DM, HTN, HLD, CKD III, RCC s/p L nephrectomy admitted after acute R thalamic infarct, presenting with L face and hand hyperesthesia, following initial gait instability. Pt w/ LLE hyperesthesia at baseline following 2019 hem stroke.  7/5 CT Head not diagnostic 7/6 MRI: sub-cm right lateral thalamic infarct and general atherosclerotic disease Additional work up did not reveal cardiogenic or carotid source.  Received loading doses of ASA and Plavix, followed by maintenance 81mg  ASA and 75mg  Plavix, intended for 21 days followed by 81mg  ASA alone. Also started Lipitor for decreased stroke risk and LDL of 151  Pt's initial weakness improved over hospital stay, was evaluated by PT and felt not to need ongoing PT after discharge. Pt continues to have some L facial and LUE hyperesthesia, but is otherwise back to baseline.  Discharging with ASA, Plavix, Lipitor and resuming Zestoretic and Bydureon.

## 2021-05-20 NOTE — Progress Notes (Signed)
Subjective: [name] is a 65 y/o female with prior R basal ganglia hemorrhagic stroke, DM, HTN, HLD, CKD III, RCC s/p L nephrectomy, admitted after acute R lateral thalamic infarct presenting with L face and hand hyperesthesias following initial gait instability. Pt had LLE hyperesthesias at baseline following 2019 hemorrhagic stroke.  [name] notes that she is overall doing quite well and has no concerns this AM. She notes that she continues having some mild hyperesthesia in her L hand and the left side of her face near her temple and cheek. Denies any remaining weakness or changes in mental status. She denies any CP, SOB or urinary symptoms.   She saw PT this morning and notes that she ambulated in her own strength and that the session went quite well. PT did not note any barriers to discharge in their note.  Objective: Vital signs in last 24 hours: Vitals:   05/20/21 0000 05/20/21 0254 05/20/21 0312 05/20/21 0752  BP: 139/83 (!) 173/82 (!) 173/93 (!) 172/71  Pulse: 90 94 91 71  Resp: 17 16 19 20   Temp:  98.6 F (37 C) 98.1 F (36.7 C) 98.1 F (36.7 C)  TempSrc:  Oral Oral Oral  SpO2: 99% 100%  100%  Weight:      Height:       Weight change:  No intake or output data in the 24 hours ending 05/20/21 0946  General: Pleasant, well-appearing middle-age woman sitting at side of bed eating breakfast. No acute distress. Head: Normocephalic. Atraumatic. CV: RRR. No murmurs, rubs, or gallops. No LE edema Pulmonary: Lungs CTAB. Normal effort. No wheezing or rales. Abdominal: Soft, nontender, nondistended. Normal bowel sounds. Extremities: Palpable pulses. Normal ROM. Skin: Warm and dry. No obvious rash or lesions. Neuro: A&Ox3. Reported hyperesthesia in LLE, LUE and V2 CN distribution. Moves all extremities. Normal strength and reflexes. No focal deficit. Psych: Normal mood and affect.  Lab Results: CBC    Component Value Date/Time   WBC 7.7 05/19/2021 2232   RBC  4.34 05/19/2021 2232   HGB 12.3 05/19/2021 2232   HCT 38.7 05/19/2021 2232   PLT 241 05/19/2021 2232   MCV 89.2 05/19/2021 2232   MCH 28.3 05/19/2021 2232   MCHC 31.8 05/19/2021 2232   RDW 14.7 05/19/2021 2232   LYMPHSABS 4.0 05/18/2021 1933   MONOABS 0.7 05/18/2021 1933   EOSABS 0.2 05/18/2021 1933   BASOSABS 0.0 05/18/2021 1933    CMP     Component Value Date/Time   NA 138 05/19/2021 2232   K 3.7 05/19/2021 2232   CL 103 05/19/2021 2232   CO2 28 05/19/2021 2232   GLUCOSE 131 (H) 05/19/2021 2232   BUN 27 (H) 05/19/2021 2232   CREATININE 1.56 (H) 05/19/2021 2232   CALCIUM 8.9 05/19/2021 2232   PROT 6.8 05/19/2021 2232   ALBUMIN 2.8 (L) 05/19/2021 2232   AST 22 05/19/2021 2232   ALT 16 05/19/2021 2232   ALKPHOS 80 05/19/2021 2232   BILITOT 0.8 05/19/2021 2232   GFRNONAA 37 (L) 05/19/2021 2232   GFRAA 51 (L) 01/09/2020 1149   Lipid Panel     Component Value Date/Time   CHOL 215 (H) 05/19/2021 2232   TRIG 96 05/19/2021 2232   HDL 44 05/19/2021 2232   CHOLHDL 4.9 05/19/2021 2232   VLDL 19 05/19/2021 2232   LDLCALC 152 (H) 05/19/2021 2232    Micro Results: No results found for this or any previous visit (from the past 240 hour(s)). Studies/Results: CT HEAD WO  CONTRAST  Result Date: 05/18/2021 CLINICAL DATA:  Numbness EXAM: CT HEAD WITHOUT CONTRAST TECHNIQUE: Contiguous axial images were obtained from the base of the skull through the vertex without intravenous contrast. COMPARISON:  CT 01/09/2020, 02/16/2018, 01/05/2018, 01/04/2018 FINDINGS: Brain: No acute territorial infarction, hemorrhage, or intracranial mass. Encephalomalacia within the right basal ganglia. Stable ventricle size. Mild white matter hypodensity likely chronic small vessel ischemic change. Vascular: No hyperdense vessels. Scattered carotid vascular calcification Skull: Normal. Negative for fracture or focal lesion. Sinuses/Orbits: No acute finding. Other: None IMPRESSION: 1. No definite CT evidence for  acute intracranial abnormality. 2. Similar encephalomalacia in the right basal ganglia. Mild chronic small vessel ischemic change of the white matter. Electronically Signed   By: Donavan Foil M.D.   On: 05/18/2021 21:03   MR ANGIO HEAD WO CONTRAST  Result Date: 05/19/2021 CLINICAL DATA:  Stroke, follow-up. EXAM: MRA HEAD WITHOUT CONTRAST TECHNIQUE: Angiographic images of the Circle of Willis were acquired using MRA technique without intravenous contrast. COMPARISON:  Noncontrast brain MRI performed earlier today 05/19/2021. Noncontrast head CT 05/18/2021. FINDINGS: Anterior circulation: The intracranial internal carotid arteries are patent. Mild atherosclerotic irregularity of both vessels without stenosis. The M1 middle cerebral arteries are patent. Segmental occlusion of a mid to distal right M2 MCA vessel (series 1050, image 16). This may be chronic given the adjacent chronic right subinsular ischemia demonstrated on the same-day brain MRI. Moderate stenosis within a proximal M2 left MCA vessel (series 1050, image 5). The right anterior cerebral artery is diminutive beyond the proximal A2 segment. The left A2/A3 and more distal left anterior cerebral artery is significantly dominant. No intracranial aneurysm is identified. Posterior circulation: The intracranial vertebral arteries are patent. Atherosclerotic irregularity of the V4 left vertebral artery with up to moderate stenosis. The basilar artery is patent. The posterior cerebral arteries are patent. Atherosclerotic irregularity of the left posterior cerebral artery without high-grade proximal stenosis. Predominantly fetal origin left posterior cerebral artery. A right posterior communicating artery is present. Anatomic variants: None significant IMPRESSION: Segmental occlusion of a mid-to-distal right M2 MCA vessel. This may be chronic given the adjacent chronic right subinsular ischemia demonstrated on the same-day brain MRI. Additional intracranial  atherosclerotic disease with multifocal intracranial arterial stenoses, most notably as follows. Moderate stenosis within a proximal left M2 MCA vessel. Up to moderate stenosis within the V4 left vertebral artery. Electronically Signed   By: Kellie Simmering DO   On: 05/19/2021 12:25   MR ANGIO NECK WO CONTRAST  Result Date: 05/19/2021 CLINICAL DATA:  Follow-up hemorrhagic stroke with left lower extremity numbness. Progressive numbness now involving the left upper extremity and face. Acute right thalamic infarction. EXAM: MRA NECK WITHOUT CONTRAST TECHNIQUE: Angiographic images of the neck were acquired using MRA technique without intravenous contrast. Carotid stenosis measurements (when applicable) are obtained utilizing NASCET criteria, using the distal internal carotid diameter as the denominator. COMPARISON:  MRI same day FINDINGS: Aortic arch: Poorly seen without contrast.  No visible abnormality. Right carotid system: Common carotid artery widely patent to the bifurcation. Carotid bifurcation appears normal without stenosis or irregularity. Cervical ICA patent to the skull base. Left carotid system: Common carotid artery widely patent to the bifurcation. Carotid bifurcation appears normal without stenosis or irregularity. Cervical ICA patent to the skull base. Vertebral arteries: Normal appearance through the cervical region to the foramen magnum. Other: None. IMPRESSION: Negative noncontrast MR angiography of the neck vessels. Electronically Signed   By: Nelson Chimes M.D.   On: 05/19/2021 12:30  MR BRAIN WO CONTRAST  Result Date: 05/19/2021 CLINICAL DATA:  Left-sided numbness and tingling beginning yesterday. EXAM: MRI HEAD WITHOUT CONTRAST TECHNIQUE: Multiplanar, multiecho pulse sequences of the brain and surrounding structures were obtained without intravenous contrast. COMPARISON:  Head CT 05/18/2021 FINDINGS: Brain: Diffusion imaging shows a subcentimeter acute infarction in the lateral right thalamus.  No other acute abnormality. Chronic small-vessel ischemic changes affect the pons. Few tiny small-vessel old cerebellar infarctions. Cerebral hemispheres show old small vessel infarctions of the thalami and the radiating white matter tracts on the right. Moderate chronic small-vessel change of the hemispheric white matter elsewhere. No cortical or large vessel territory infarction. No mass lesion, evidence of acute hemorrhage, hydrocephalus or extra-axial collection. Punctate foci of hemosiderin deposition associated with many of the old deep brain infarctions. More extensive hemosiderin deposition in the region of the radiating white matter infarction on the right. Vascular: Major vessels at the base of the brain show flow. Skull and upper cervical spine: Negative Sinuses/Orbits: Clear/normal Other: None IMPRESSION: Subcentimeter acute infarction of the right lateral thalamus. Extensive old ischemic changes affecting the brainstem, thalami and radiating white matter tracts on the right, many with hemosiderin deposition related to old hemorrhages. No evidence of acute hemorrhage at the right lateral thalamic infarction at this time. Electronically Signed   By: Nelson Chimes M.D.   On: 05/19/2021 08:05   ECHOCARDIOGRAM COMPLETE  Result Date: 05/19/2021    ECHOCARDIOGRAM REPORT   Patient Name:   [name] Date of Exam: 05/19/2021 Medical Rec #:  [id number]          Height:       61.0 in Accession #:    [id number]         Weight:       250.0 lb Date of Birth:  17-Jun-1956         BSA:          2.077 m Patient Age:    30 years           BP:           171/75 mmHg Patient Gender: F                  HR:           76 bpm. Exam Location:  Inpatient Procedure: 2D Echo, Cardiac Doppler and Color Doppler Indications:    Stroke I63.9  History:        Patient has prior history of Echocardiogram examinations, most                 recent 01/05/2018. Risk Factors:Diabetes and Hypertension. CKD.                 Cancer.   Sonographer:    Jonelle Sidle Dance Referring Phys: [id number] [place]  1. Left ventricular ejection fraction, by estimation, is 60 to 65%. The left ventricle has normal function. The left ventricle has no regional wall motion abnormalities. There is mild concentric left ventricular hypertrophy. Left ventricular diastolic parameters are indeterminate.  2. Right ventricular systolic function is normal. The right ventricular size is normal.  3. A small pericardial effusion is present. The pericardial effusion is circumferential.  4. The mitral valve is grossly normal. Trivial mitral valve regurgitation. No evidence of mitral stenosis.  5. The aortic valve is grossly normal. There is mild calcification of the aortic valve. Aortic valve regurgitation is trivial. Mild aortic valve sclerosis is present, with no evidence of aortic valve  stenosis.  6. The inferior vena cava is dilated in size with >50% respiratory variability, suggesting right atrial pressure of 8 mmHg. Comparison(s): No significant change from prior study. Conclusion(s)/Recommendation(s): No intracardiac source of embolism detected on this transthoracic study. A transesophageal echocardiogram is recommended to exclude cardiac source of embolism if clinically indicated. FINDINGS  Left Ventricle: Left ventricular ejection fraction, by estimation, is 60 to 65%. The left ventricle has normal function. The left ventricle has no regional wall motion abnormalities. The left ventricular internal cavity size was normal in size. There is  mild concentric left ventricular hypertrophy. Left ventricular diastolic parameters are indeterminate. Right Ventricle: The right ventricular size is normal. Right vetricular wall thickness was not well visualized. Right ventricular systolic function is normal. Left Atrium: Left atrial size was normal in size. Right Atrium: Right atrial size was normal in size. Pericardium: A small pericardial effusion is present. The  pericardial effusion is circumferential. Presence of pericardial fat pad. Mitral Valve: The mitral valve is grossly normal. Trivial mitral valve regurgitation. No evidence of mitral valve stenosis. Tricuspid Valve: The tricuspid valve is grossly normal. Tricuspid valve regurgitation is trivial. No evidence of tricuspid stenosis. Aortic Valve: The aortic valve is grossly normal. There is mild calcification of the aortic valve. Aortic valve regurgitation is trivial. Mild aortic valve sclerosis is present, with no evidence of aortic valve stenosis. Pulmonic Valve: The pulmonic valve was not well visualized. Pulmonic valve regurgitation is not visualized. Aorta: The aortic root, ascending aorta and aortic arch are all structurally normal, with no evidence of dilitation or obstruction. Venous: The inferior vena cava is dilated in size with greater than 50% respiratory variability, suggesting right atrial pressure of 8 mmHg. IAS/Shunts: The atrial septum is grossly normal.  LEFT VENTRICLE PLAX 2D LVIDd:         4.60 cm  Diastology LVIDs:         3.20 cm  LV e' medial:    5.87 cm/s LV PW:         1.30 cm  LV E/e' medial:  7.5 LV IVS:        1.35 cm  LV e' lateral:   6.74 cm/s LVOT diam:     1.90 cm  LV E/e' lateral: 6.5 LV SV:         53 LV SV Index:   26 LVOT Area:     2.84 cm  RIGHT VENTRICLE             IVC RV Basal diam:  2.50 cm     IVC diam: 2.10 cm RV S prime:     11.10 cm/s TAPSE (M-mode): 1.7 cm LEFT ATRIUM             Index       RIGHT ATRIUM           Index LA diam:        4.20 cm 2.02 cm/m  RA Area:     11.40 cm LA Vol (A2C):   23.5 ml 11.32 ml/m RA Volume:   21.90 ml  10.55 ml/m LA Vol (A4C):   26.9 ml 12.95 ml/m LA Biplane Vol: 26.2 ml 12.62 ml/m  AORTIC VALVE LVOT Vmax:   74.60 cm/s LVOT Vmean:  52.300 cm/s LVOT VTI:    0.187 m  AORTA Ao Root diam: 3.00 cm Ao Asc diam:  2.80 cm MITRAL VALVE MV Area (PHT): 2.66 cm    SHUNTS MV Decel Time: 285 msec    Systemic VTI:  0.19 m MV E velocity: 44.10 cm/s   Systemic Diam: 1.90 cm MV A velocity: 73.30 cm/s MV E/A ratio:  0.60 Buford Dresser MD Electronically signed by Buford Dresser MD Signature Date/Time: 05/19/2021/5:45:20 PM    Final    Medications: I have reviewed the patient's current medications. Scheduled Meds:  aspirin EC  81 mg Oral Daily   atorvastatin  40 mg Oral Daily   clopidogrel  75 mg Oral Daily   enoxaparin (LOVENOX) injection  40 mg Subcutaneous Q24H   insulin aspart  0-15 Units Subcutaneous TID WC   sodium chloride flush  3 mL Intravenous Once   Continuous Infusions: PRN Meds:.labetalol Assessment/Plan: Active Problems:   CVA (cerebral vascular accident) ([place])  Acute infarct of right lateral thalamus Sub-centimeter infarct as seen on 7/6 MRI Brain. MRA Head revealed segmental occlusion of R M2 and additional atherosclerotic disease with moderate stenosis in proximal L M2 vessel and moderate stenosis in L V4. MRA Neck revealed "widely patent" carotid arteries and was benign. 7/6 ECHO did not reveal a cardiogenic source of embolism and showed an EF of 60-65%. Initial weakness not overtly discernible on physical exam this AM, and PT note described independent bed mobility, transfers and toileting, in addition to hallway ambulation without an AD while PT provided minimum guarding for safety.  -Continue DAPT (81mg  ASA and 75mg  Plavix) x 21 days, then 81mg  ASA afterwards to mitigate ongoing risk per small vessel thrombotic disease -Continue 40mg  Lipitor to mitigate stroke risk in future given HLD (05/19/21 LDL at 152) -Continue permissive HTN w/ 5mg  Labetalol q 2h prn for SBP>200 and DBP>100. -Expect discharge today pending Neuro evaluation  Diabetes mellitus 05/19/21 A1C at 7.2. Pt takes weekly 2mg  Exenatide as outpatient. -CBG QID -Sliding scale Aspart 0-15u TID  HTN -Permissive HTN w/ 5mg  Labetalol q2hr prn, as noted above  CKD -Monitoring Creatinine, has increased slightly from baseline (1.33 to now 1.56) in the  absence of azotemia, urinary symptoms, signs of hypovolemia, hypotension and intra-renal risks. Suspect daily fluctuation.  This is a Careers information officer Note.  The care of the patient was discussed with Dr. Philipp Ovens and the assessment and plan formulated with their assistance.  Please see their attached note for official documentation of the daily encounter.   LOS: 1 day   London Pepper, Medical Student 05/20/2021, 9:46 AM

## 2021-05-20 NOTE — Plan of Care (Signed)
Min assist adls

## 2021-05-20 NOTE — Discharge Instructions (Addendum)
You were hospitalized for stroke. Thank you for allowing [place] to be part of your care.   You should follow up with your primary care doctor in 1 week.   Please note these changes made to your medications:   Please START taking: Asprin and Plavix, for 3 weeks. After this you will be on Asprin alone. Also start atorvastatin 80 mg daily.

## 2021-05-20 NOTE — TOC Transition Note (Signed)
Transition of Care [facility]) - CM/SW Discharge Note   Patient Details  Name: [name] MRN: [id number] Date of Birth: [birth year]  [facility]) CM/SW Contact:  Pollie Friar, RN Phone Number: 05/20/2021, 4:00 PM   Clinical Narrative:    Patient is discharging home with self care. No f/u per PT/OT. Recommendations for a shower seat. Pt has a 3 in 1 she uses in the shower.  Pt denies issues with home medications or transportation. Pt has transport home today.   Final next level of care: Home/Self Care Barriers to Discharge: No Barriers Identified   Patient Goals and CMS Choice        Discharge Placement                       Discharge Plan and Services                                     Social Determinants of Health (SDOH) Interventions     Readmission Risk Interventions No flowsheet data found.

## 2021-05-20 NOTE — Plan of Care (Signed)
initiated

## 2021-05-20 NOTE — Evaluation (Signed)
Occupational Therapy Evaluation Patient Details Name: [name] MRN: [id number] DOB: March 07, 1956 Today's Date: 05/20/2021    History of Present Illness Pt is a 65 y/o female admitted secondary to hyperesthesia of the L face and hand with initial gait instability. MRI of the brain revealed an acute R lateral thalamus infarct. PMH including but not limited to HTN, Renal Cell Carcinoma, IBS, BPPV, T2DM, CKD Stage II, HLD, and past history of intracranial hemorrhage in 2019 with residual left lower extremity paresthesias.   Clinical Impression   Patient admitted for the diagnosis above.  PTA she lives in an apartment with her grandson, who can assist PRN, as he works and goes to school  She was independent with all mobility, ADL/IADL, and continued to drive.  Barrier is a little unsteadiness, but she is close to her baseline function for in room mobility/toileting, and ADL completion.  OT issued a squeeze ball for L hand weakness, and foam to build up utensils if needed.  No further acute OT needs identified.  Recommended up and walking with staff.  OT discussed shower chair for safety.      Follow Up Recommendations  No OT follow up    Equipment Recommendations  Tub/shower seat    Recommendations for Other Services       Precautions / Restrictions Precautions Precautions: None Restrictions Weight Bearing Restrictions: No      Mobility Bed Mobility Overal bed mobility: Independent               Patient Response: Cooperative  Transfers Overall transfer level: Independent                    Balance Overall balance assessment: Mild deficits observed, not formally tested                                         ADL either performed or assessed with clinical judgement   ADL Overall ADL's : Needs assistance/impaired Eating/Feeding: Independent;Sitting   Grooming: Wash/dry hands;Independent;Standing           Upper Body Dressing : Set  up;Sitting   Lower Body Dressing: Minimal assistance;Sit to/from stand Lower Body Dressing Details (indicate cue type and reason): difficulty placing L sock Toilet Transfer: Independent;Ambulation   Toileting- Clothing Manipulation and Hygiene: Independent;Sit to/from stand       Functional mobility during ADLs: Independent General ADL Comments: No AD needed for in room mobility     Vision Baseline Vision/History: Wears glasses Patient Visual Report: No change from baseline       Perception     Praxis      Pertinent Vitals/Pain Pain Assessment: No/denies pain Pain Intervention(s): Monitored during session     Hand Dominance Right   Extremity/Trunk Assessment Upper Extremity Assessment Upper Extremity Assessment: LUE deficits/detail LUE Deficits / Details: generalized weakness noted, more to the hand.  Grip 3+/5 LUE Sensation: decreased light touch LUE Coordination: WNL (slowed and purposeful, but WFL's)       Cervical / Trunk Assessment Cervical / Trunk Assessment: Normal   Communication Communication Communication: No difficulties   Cognition Arousal/Alertness: Awake/alert Behavior During Therapy: WFL for tasks assessed/performed Overall Cognitive Status: Within Functional Limits for tasks assessed  General Comments       Exercises Other Exercises Other Exercises: squeeze ball issued   Shoulder Instructions      Home Living Family/patient expects to be discharged to:: Private residence Living Arrangements: Other relatives Available Help at Discharge: Family;Available PRN/intermittently Type of Home: Apartment Home Access: Level entry     Home Layout: One level     Bathroom Shower/Tub: Teacher, early years/pre: Standard     Home Equipment: None   Additional Comments: Grandson goes to school, and also works during the day.      Prior Functioning/Environment Level of Independence:  Independent                 OT Problem List: Impaired balance (sitting and/or standing)      OT Treatment/Interventions:      OT Goals(Current goals can be found in the care plan section) Acute Rehab OT Goals Patient Stated Goal: hoping to return home OT Goal Formulation: With patient Time For Goal Achievement: 05/20/21 Potential to Achieve Goals: Good  OT Frequency:     Barriers to D/C:  None noted          Co-evaluation              AM-PAC OT "6 Clicks" Daily Activity     Outcome Measure Help from another person eating meals?: None Help from another person taking care of personal grooming?: None Help from another person toileting, which includes using toliet, bedpan, or urinal?: None Help from another person bathing (including washing, rinsing, drying)?: None Help from another person to put on and taking off regular upper body clothing?: None Help from another person to put on and taking off regular lower body clothing?: A Little 6 Click Score: 23   End of Session Nurse Communication: Mobility status  Activity Tolerance: Patient tolerated treatment well Patient left: in chair;with call bell/phone within reach  OT Visit Diagnosis: Unsteadiness on feet (R26.81)                Time: 6270-3500 OT Time Calculation (min): 22 min Charges:  OT General Charges $OT Visit: 1 Visit OT Evaluation $OT Eval Moderate Complexity: 1 Mod  05/20/2021  Rich, OTR/L  Acute Rehabilitation Services  Office:  [place] 05/20/2021, 1:29 PM

## 2021-05-26 ENCOUNTER — Other Ambulatory Visit: Payer: Self-pay | Admitting: *Deleted

## 2021-05-26 NOTE — Patient Outreach (Signed)
[facility]) Care Management  05/26/2021  [name] 15-Jul-1956 [id number]   [facility] outreach for EMMI- stroke  05/25/21 RED ON EMMI ALERT Day # 3        Date: Monday 05/24/21 1602 Red Alert Reason: Questions/problems with meds?Yes Know how/when to take meds? No  05/24/21 RED ON EMMI ALERT Day # 1          Date: Saturday 05/22/21 1000  Red Alert Reason: Questions/problems with meds?Yes Know how/when to take meds?No   Insurance: united healthcare medicare   [facility] admissions x 1  ED visits x 1 in the last 6 months  Last admission 7/5-7/22 cerebral vascular accident- right lateral thalamus ischemic infarct  Outreach attempt # 1  [facility] Unsuccessful outreach   Outreach attempt to the home number  No answer. [facility] RN CM left HIPAA [facility] Portability and Accountability Act) compliant voicemail message along with CM's contact info.   Plan: [facility] RN CM scheduled this patient for another call attempt within 4-7 business days Unsuccessful outreach letter sent on 05/26/21 Unsuccessful outreach on 05/26/21   Joelene Millin L. Lavina Hamman, RN, BSN, [place] Coordinator Office number [phone] Mobile number [phone]  Main [facility] number [phone] Fax number [phone]

## 2021-05-27 ENCOUNTER — Other Ambulatory Visit: Payer: Self-pay | Admitting: *Deleted

## 2021-05-27 ENCOUNTER — Other Ambulatory Visit: Payer: Self-pay

## 2021-05-27 NOTE — Patient Outreach (Addendum)
[facility]) Care Management  05/27/2021  [name] 07-Nov-1956 [id number]   [facility] outreach for EMMI- stroke   05/25/21 RED ON EMMI ALERT Day # 3        Date: Monday 05/24/21 1602 Red Alert Reason: Questions/problems with medicines?Yes Know how/when to take medicines? 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 Patient gave verbal consent for services.    Assessment EMMI  Confirms she does have questions about her hospital discharge coagulant treatment plan of use of aspirin and Plavix as she reports she has a history of being a "Stomach bleeder"  When inquiry of follow up with neurology, RN CM was informed she "does not like" the hospital neurologist She reports with inquiry and recommended importance of neurology follow up that she was seen last a year ago by a [place] neurologist, Dr Audrea Muscat [place] Strongly recommend she outreach to her neurologist for follow up visits. Provided her with her neurologist's office number and informed her RN CM would fax her recent hospital discharge summary to her neurologist   Explain anticoagulant use after cerebrovascular accident (CVA)/stroke/infarcts for further stroke prevention Discussed stroke pathophysiology She voiced understanding She inquired about the type of stroke she  experienced on 05/18/21. She was referred to her after summary visit (discharge sheets) RN CM explained a Right Lateral Thalamus ischemic infarct and emphasized ischemia and stroke prevention  She also inquired about a community stroke association or support group She states she is coping well but would like to have support RN CM discussed [facility] care guide services and possible available resources for her  She agree to have these resources e-mailed to her and confirmed her e-mail address She reported not having my chart access but EPIC indicates an active my chart account   Advised patient that there will be further automated EMMI- post discharge calls to assess how the patient is doing following the recent hospitalization Advised the patient that another call may be received from a nurse if any of their responses were abnormal. Patient voiced understanding and was appreciative of f/u call. 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/2018   CKD (chronic kidney disease) stage 2, GFR 60-89 ml/min 02/23/2017   Sepsis ([place]) 08/06/2016   Diverticulitis 08/06/2016   Diverticulitis of intestine without perforation or abscess without bleeding    Hyperlipidemia associated with type 2 diabetes mellitus ([place]) 07/19/2016   S/p nephrectomy 05/30/2016   IBS (irritable bowel syndrome) 05/30/2016   History of renal carcinoma 05/30/2016   Morbid obesity with BMI of 40.0-44.9, adult (  [place]) 05/30/2016   Neuralgia of chest 12/30/2015   Atypical chest pain 12/01/2015   Physical exam 10/26/2015   Type 2 diabetes mellitus with stage 3 chronic kidney disease, without long-term current use of insulin ([place]) 07/28/2015   Maxillary  sinusitis, acute 01/21/2015   Strain of left pectoralis muscle 09/26/2014   Benign paroxysmal positional vertigo 10/03/2013   Nausea with vomiting 10/03/2013   Bleeding nose 90/24/0973   Helicobacter pylori gastritis 09/13/2013   Pyuria 08/20/2013   Abnormal abdominal CT scan 08/20/2013   Abdominal pain 08/16/2013   Back pain 07/31/2013   LUQ pain 07/31/2013   Laceration of hand 07/10/2013   Hidradenitis suppurativa of right axilla 05/31/2013   Renal cell carcinoma    Diverticulosis of colon with hemorrhage - recurrent 09/19/2011   Diverticulosis of large intestine without perforation or abscess with bleeding 09/19/2011   OBESITY, MORBID 09/16/2009   Hypertension associated with diabetes ([place]) 06/23/2009   History of colonic polyps 12/12/2008    Plan [facility] case closure completed as per referral pt listed to be eligible only for [facility] EMMI services Sent e-mail with stroke resource/support group [facility] Stroke Support Group This support group for stroke survivors, their families and friends meets on the second Thursday of each month from 4 to 5 p.m. at [employer] [facility], [place] [phone]. For more information, e-mail [email or web address] or call [phone] (please leave a voicemail with name and phone number).  Goals Addressed               This Visit's Progress     Patient Stated     COMPLETED: [facility]) Manage My Medicine (pt-stated)   On track     Timeframe:  Short-Term Goal Priority:  High Start Date:            05/27/21                 Expected End Date:     05/27/21                  Completed goal Date    - keep a list of all the medicines I take; vitamins and herbals too   - continue to take as ordered   Notes:  05/27/21 voiced understanding of anticoagulant use after stroke and will continue to take anticoagulant until advised to discontinue by MD. Will inquire about labs to monitor her PT/PTT/INR plus Will monitor for  any frank bleeding and call MD if note bleeding or bruising as suggested Will outreach to her neurologist for follow up visit        [place]. Lavina Hamman, RN, BSN, [place] Coordinator Office number [phone] [facility] number [phone] Fax number [phone]

## 2021-05-31 ENCOUNTER — Other Ambulatory Visit: Payer: Self-pay | Admitting: *Deleted

## 2021-05-31 NOTE — Patient Outreach (Signed)
[facility]) Care Management  05/31/2021  [name] Oct 07, 1956 [id number]   RED ON EMMI ALERT - Stroke Day # 9 Date: 7/17 Red Alert Reason: Lost interest in things   Outreach attempt #1, successful.  Identity verified.  This care manager introduced self and stated purpose of call.  [facility] care management services explained.   Member report she is unable to speak freely at this time, will call this care manager back.    Plan: RN CM will await call back.  If no call back, will follow up within the next 3-4 business days.  [name], [place], MSN [place] [phone]

## 2021-06-04 ENCOUNTER — Other Ambulatory Visit: Payer: Self-pay | Admitting: *Deleted

## 2021-06-04 NOTE — Patient Outreach (Signed)
[facility]) Care Management  06/04/2021  [name] 07/27/1956 [id number]  RED ON EMMI ALERT - Stroke Day # 9 Date: 7/17 Red Alert Reason: Lost interest in things   Outreach attempt #2, successful.  Report she is doing well, state she is "building my self back up."  State she has episodes after eventful situations in her life where she's feeling down, is usually back to herself within a couple weeks.  Feels she is already improving, denies need for therapy or medications.  Report having sister and other family for support.  Had follow up appointment with PCP on 7/15, will see neurology on 9/26.  She is able to drive herself to appointments.     Plan: RN CM will close case at this time as member denies any further needs, not eligible for full [facility] benefits.  Valente David, [place], MSN [place] [phone]

## 2021-06-10 ENCOUNTER — Other Ambulatory Visit: Payer: Self-pay | Admitting: Internal Medicine

## 2021-07-06 ENCOUNTER — Other Ambulatory Visit: Payer: Self-pay | Admitting: Internal Medicine

## 2021-08-03 ENCOUNTER — Ambulatory Visit: Payer: Medicare Other | Admitting: Physician Assistant

## 2021-08-13 ENCOUNTER — Encounter: Payer: Self-pay | Admitting: *Deleted

## 2021-08-18 ENCOUNTER — Ambulatory Visit: Payer: Medicare Other | Admitting: Gastroenterology

## 2021-08-18 ENCOUNTER — Other Ambulatory Visit (INDEPENDENT_AMBULATORY_CARE_PROVIDER_SITE_OTHER): Payer: Medicare Other

## 2021-08-18 ENCOUNTER — Encounter: Payer: Self-pay | Admitting: Gastroenterology

## 2021-08-18 VITALS — BP 128/80 | HR 77 | Ht 61.0 in | Wt 231.0 lb

## 2021-08-18 DIAGNOSIS — K588 Other irritable bowel syndrome: Secondary | ICD-10-CM

## 2021-08-18 DIAGNOSIS — R109 Unspecified abdominal pain: Secondary | ICD-10-CM | POA: Diagnosis not present

## 2021-08-18 DIAGNOSIS — D649 Anemia, unspecified: Secondary | ICD-10-CM | POA: Insufficient documentation

## 2021-08-18 LAB — CBC WITH DIFFERENTIAL/PLATELET
Basophils Absolute: 0.1 10*3/uL (ref 0.0–0.1)
Basophils Relative: 1.3 % (ref 0.0–3.0)
Eosinophils Absolute: 0.2 10*3/uL (ref 0.0–0.7)
Eosinophils Relative: 2.2 % (ref 0.0–5.0)
HCT: 39.1 % (ref 36.0–46.0)
Hemoglobin: 12.2 g/dL (ref 12.0–15.0)
Lymphocytes Relative: 35.6 % (ref 12.0–46.0)
Lymphs Abs: 2.6 10*3/uL (ref 0.7–4.0)
MCHC: 31.3 g/dL (ref 30.0–36.0)
MCV: 88.5 fl (ref 78.0–100.0)
Monocytes Absolute: 0.6 10*3/uL (ref 0.1–1.0)
Monocytes Relative: 7.7 % (ref 3.0–12.0)
Neutro Abs: 3.8 10*3/uL (ref 1.4–7.7)
Neutrophils Relative %: 53.2 % (ref 43.0–77.0)
Platelets: 225 10*3/uL (ref 150.0–400.0)
RBC: 4.42 Mil/uL (ref 3.87–5.11)
RDW: 15.4 % (ref 11.5–15.5)
WBC: 7.2 10*3/uL (ref 4.0–10.5)

## 2021-08-18 MED ORDER — DICYCLOMINE HCL 10 MG PO CAPS: 10.0000 mg | ORAL_CAPSULE | Freq: Two times a day (BID) | ORAL | 2 refills | Status: DC

## 2021-08-18 NOTE — Progress Notes (Signed)
08/18/2021 [name] [id number] November 10, 1956   HISTORY OF PRESENT ILLNESS: This is a 65 year old female who is a patient of Dr. Celesta Aver.  She presents here today at the request of Lars Mage, NP, for evaluation of iron deficiency anemia.  She tells me that she had a stroke back in July.  She was started on both aspirin and Plavix at that time.  She says that about a week after that she started having black stools.  She discontinued the aspirin and has only continued Plavix since that time.  The black stools have resolved and she has not had any further black or bloody stools since probably about the second week in July.  She had a hemoglobin that was 10.9 g about 4 weeks ago.  MCV is normal.  This is what prompted the referral.  She tells me that she was placed on oral iron for very short time, but stopped that about 3 weeks ago.  She says that her bowel habits are fine, but she describes some cramping in her lower abdomen.  She says that that began after the bleeding issue stopped.  At first she tells me that the pain/cramping comes and goes and that she does not have her every day, but then later on in the conversation she tells me that it is present daily.  She does carry diagnosis of IBS per her chart review.  EGD 10/2019:  - 2 cm hiatal hernia. - Z-line regular, 38 cm from the incisors. - Normal upper third of esophagus and middle third of esophagus. - Gastritis. Biopsied. - One gastric polyp. Resected and retrieved. - A small amount of food (residue) in the stomach. - Normal duodenal bulb, first portion of the duodenum and second portion of the duodenum. Biopsied. - Some small amounts of food residue noted in the stomach. Given time since last solid food ingestion, could be suggestive of gastroparesis, which was similarly noted on EGD in 2004 and 2010. Otherwise, widely patent pylorus, and no evidence of gastric outlet obstruction.  Pathology: A. DUODENUM, BIOPSY:  -  Benign small bowel mucosa.  - No villous blunting or increase in intraepithelial lymphocytes.  - No dysplasia or malignancy.   B. STOMACH, BIOPSY:  - Mild chronic gastritis.  - Warthin-Starry is negative for Helicobacter pylori.  - No intestinal metaplasia, dysplasia, or malignancy.   C. STOMACH, FUNDIC, POLYPECTOMY:  - Fundic gland polyp(s).  - No intestinal metaplasia, dysplasia, or malignancy.    Colonoscopy April 2014 showed severe diverticulosis in the left colon and moderate diverticulosis in the right colon as well as small internal hemorrhoids.  2 polyps were removed, one was an inflammatory type polyp, and the other showed benign colonic mucosa.  Repeat was recommended 10-year interval.  Past Medical History:  Diagnosis Date   Anemia    Blood transfusion 2010   associated with GI bleeding   CKD (chronic kidney disease) stage 2, GFR 60-89 ml/min    Depression    Diabetes mellitus    Diverticulosis of colon with hemorrhage 2010, 09/2011   H/O hiatal hernia    Helicobacter pylori gastritis 09/13/2013   Hx: UTI (urinary tract infection)    Hypertension    IBS (irritable bowel syndrome)    IDA (iron deficiency anemia)    Personal history of colonic polyps    adenomas since 2010   Renal cell carcinoma 2004   lt kidney removed   Stroke [facility]) 12/2017   05/2021   Past Surgical History:  Procedure Laterality Date   ABDOMINAL HYSTERECTOMY     ABDOMINAL HYSTERECTOMY     BIOPSY  10/23/2019   Procedure: BIOPSY;  Surgeon: Lavena Bullion, DO;  Location: [facility];  Service: Gastroenterology;;   CHOLECYSTECTOMY     COLONOSCOPY  12/19/2008   diverticulosis   COLONOSCOPY  11/17/2011   Procedure: COLONOSCOPY;  Surgeon: Zenovia Jarred, MD;  Location: [facility];  Service: Gastroenterology;  Laterality: N/A;   DILATION AND CURETTAGE OF UTERUS N/A 07/11/2014   Procedure: Repair of Vaginal Cuff;  Surgeon: Melina Schools, MD;  Location: [facility];  Service: Gynecology;  Laterality: N/A;    ESOPHAGOGASTRODUODENOSCOPY (EGD) WITH PROPOFOL N/A 10/23/2019   Procedure: ESOPHAGOGASTRODUODENOSCOPY (EGD) WITH PROPOFOL;  Surgeon: Lavena Bullion, DO;  Location: [place];  Service: Gastroenterology;  Laterality: N/A;   FLEXIBLE SIGMOIDOSCOPY  12/12/2008   diverticulosis   HAND SURGERY     x5 right   HYDRADENITIS EXCISION Right 03/21/2014   Procedure: EXCISION HIDRADENITIS AXILLA;  Surgeon: Pedro Earls, MD;  Location: [place];  Service: General;  Laterality: Right;   KNEE ARTHROSCOPY     x2 left   NEPHRECTOMY     left   TRIGGER FINGER RELEASE Left 06/23/2015   Procedure: RELEASE TRIGGER FINGER/A-1 PULLEY LEFT RING FINGER;  Surgeon: Daryll Brod, MD;  Location: [place];  Service: Orthopedics;  Laterality: Left;   TUBAL LIGATION     UPPER GASTROINTESTINAL ENDOSCOPY  12/12/2008   gastroporesis    reports that she has never smoked. She has never used smokeless tobacco. She reports that she does not drink alcohol and does not use drugs. family history includes Breast cancer in her daughter and maternal grandmother; Colon polyps in her mother; Diabetes in her father and maternal grandmother. No Known Allergies    Outpatient Encounter Medications as of 08/18/2021  Medication Sig   albuterol (VENTOLIN HFA) 108 (90 Base) MCG/ACT inhaler Inhale 2 puffs into the lungs every 6 (six) hours as needed for wheezing.   atorvastatin (LIPITOR) 80 MG tablet Take 1 tablet (80 mg total) by mouth daily.   BYDUREON 2 MG PEN Inject 2 mg into the skin every 7 (seven) days. On weekends   clopidogrel (PLAVIX) 75 MG tablet Take 1 tablet (75 mg total) by mouth daily.   fluticasone (FLONASE) 50 MCG/ACT nasal spray Place 2 sprays into both nostrils daily as needed for allergies or rhinitis.   lisinopril-hydrochlorothiazide (ZESTORETIC) 20-12.5 MG tablet Take 2 tablets by mouth daily.   [DISCONTINUED] aspirin EC 81 MG EC tablet Take 1 tablet (81 mg total) by mouth daily.  Swallow whole. (Patient not taking: Reported on 08/18/2021)   No facility-administered encounter medications on file as of 08/18/2021.   REVIEW OF SYSTEMS  : All other systems reviewed and negative except where noted in the History of Present Illness.  PHYSICAL EXAM: BP 128/80   Pulse 77   Ht 5\' 1"  (1.549 m)   Wt 231 lb (104.8 kg)   LMP 11/16/2002   SpO2 98%   BMI 43.65 kg/m  General: Well developed AA female in no acute distress Head: Normocephalic and atraumatic Eyes:  Sclerae anicteric, conjunctiva pink. Ears: Normal auditory acuity Lungs: Clear throughout to auscultation; no W/R/R. Heart: Regular rate and rhythm; no M/R/G. Abdomen: Soft, non-distended.  BS present.  Non-tender. Musculoskeletal: Symmetrical with no gross deformities  Skin: No lesions on visible extremities Extremities: No edema  Neurological: Alert oriented x 4, grossly non-focal Psychological:  Alert and cooperative.  Normal mood and affect  ASSESSMENT AND PLAN: *Black stool: This occurred back in July when she started on both aspirin and Plavix after a CVA.  She discontinued the aspirin and has just continued on Plavix daily.  She's had no black stools since that time.  Hemoglobin 4 weeks ago was 10.9 g.  She had been placed on iron just for very short time.  We will repeat CBC today. *Lower abdominal cramping: Carries a diagnosis of IBS.  We will try Bentyl 10 mg twice daily.  Prescription sent to pharmacy.  We will have her follow-up in about 6 to 8 weeks.   CC:  Bernerd Limbo, MD CC:  Lars Mage, NP

## 2021-08-18 NOTE — Patient Instructions (Addendum)
It was my pleasure to provide care to you today. Based on our discussion, I am providing you with my recommendations below:  RECOMMENDATION(S):   PRESCRIPTION MEDICATION(S):   We have sent the following medication(s) to your pharmacy:  Bentyl  NOTE: If your medication(s) requires a PRIOR AUTHORIZATION, we will receive notification from your pharmacy. Once received, the process to submit for approval may take up to 7-10 business days. You will be contacted about any denials we have received from your insurance company as well as alternatives recommended by your provider.  LABS:   Please proceed to the basement level for lab work before leaving today. Press "B" on the elevator. The lab is located at the first door on the left as you exit the elevator.  HEALTHCARE LAWS AND MY CHART RESULTS:   Due to recent changes in healthcare laws, you may see results of your imaging and/or laboratory studies on MyChart before I have had a chance to review them.  I understand that in some cases there may be results that are confusing or concerning to you. Please understand that not all results are received at same time and often I may need to interpret multiple results in order to provide you with the best plan of care or course of treatment. Therefore, I ask that you please give me 48 hours to thoroughly review all your results before contacting my office for clarification.   FOLLOW UP:  I would like for you to follow up with me as needed. Please call the office at [phone] to schedule your appointment.  BMI:  If you are age 100 or younger, your body mass index should be between 19-25. Your Body mass index is 43.65 kg/m. If this is out of the aformentioned range listed, please consider follow up with your Primary Care Provider.   MY CHART:  The  GI providers would like to encourage you to use [facility] to communicate with providers for non-urgent requests or questions.  Due to long hold times  on the telephone, sending your provider a message by [facility] may be a faster and more efficient way to get a response.  Please allow 48 business hours for a response.  Please remember that this is for non-urgent requests.   Thank you for trusting me with your gastrointestinal care!    Alonza Bogus PA-C

## 2021-09-12 ENCOUNTER — Encounter (HOSPITAL_BASED_OUTPATIENT_CLINIC_OR_DEPARTMENT_OTHER): Payer: Self-pay | Admitting: Emergency Medicine

## 2021-09-12 ENCOUNTER — Emergency Department (HOSPITAL_BASED_OUTPATIENT_CLINIC_OR_DEPARTMENT_OTHER): Payer: Medicare Other

## 2021-09-12 ENCOUNTER — Other Ambulatory Visit: Payer: Self-pay

## 2021-09-12 ENCOUNTER — Emergency Department (HOSPITAL_BASED_OUTPATIENT_CLINIC_OR_DEPARTMENT_OTHER)
Admission: EM | Admit: 2021-09-12 | Discharge: 2021-09-12 | Disposition: A | Discharge status: Home / Self Care | Payer: Medicare Other | Attending: Emergency Medicine | Admitting: Emergency Medicine

## 2021-09-12 DIAGNOSIS — E1122 Type 2 diabetes mellitus with diabetic chronic kidney disease: Secondary | ICD-10-CM | POA: Diagnosis not present

## 2021-09-12 DIAGNOSIS — N183 Chronic kidney disease, stage 3 unspecified: Secondary | ICD-10-CM | POA: Diagnosis not present

## 2021-09-12 DIAGNOSIS — Z85528 Personal history of other malignant neoplasm of kidney: Secondary | ICD-10-CM | POA: Insufficient documentation

## 2021-09-12 DIAGNOSIS — K429 Umbilical hernia without obstruction or gangrene: Secondary | ICD-10-CM | POA: Diagnosis not present

## 2021-09-12 DIAGNOSIS — I129 Hypertensive chronic kidney disease with stage 1 through stage 4 chronic kidney disease, or unspecified chronic kidney disease: Secondary | ICD-10-CM | POA: Insufficient documentation

## 2021-09-12 DIAGNOSIS — R1031 Right lower quadrant pain: Secondary | ICD-10-CM

## 2021-09-12 DIAGNOSIS — R109 Unspecified abdominal pain: Secondary | ICD-10-CM | POA: Diagnosis present

## 2021-09-12 LAB — COMPREHENSIVE METABOLIC PANEL
ALT: 18 U/L (ref 0–44)
AST: 22 U/L (ref 15–41)
Albumin: 3.4 g/dL — ABNORMAL LOW (ref 3.5–5.0)
Alkaline Phosphatase: 94 U/L (ref 38–126)
Anion gap: 7 (ref 5–15)
BUN: 28 mg/dL — ABNORMAL HIGH (ref 8–23)
CO2: 26 mmol/L (ref 22–32)
Calcium: 9.2 mg/dL (ref 8.9–10.3)
Chloride: 106 mmol/L (ref 98–111)
Creatinine, Ser: 1.43 mg/dL — ABNORMAL HIGH (ref 0.44–1.00)
GFR, Estimated: 41 mL/min — ABNORMAL LOW (ref >60)
Glucose, Bld: 124 mg/dL — ABNORMAL HIGH (ref 70–99)
Potassium: 3.9 mmol/L (ref 3.5–5.1)
Sodium: 139 mmol/L (ref 135–145)
Total Bilirubin: 0.2 mg/dL — ABNORMAL LOW (ref 0.3–1.2)
Total Protein: 8 g/dL (ref 6.5–8.1)

## 2021-09-12 LAB — CBC WITH DIFFERENTIAL/PLATELET
Abs Immature Granulocytes: 0.02 10*3/uL (ref 0.00–0.07)
Basophils Absolute: 0 10*3/uL (ref 0.0–0.1)
Basophils Relative: 0 %
Eosinophils Absolute: 0.1 10*3/uL (ref 0.0–0.5)
Eosinophils Relative: 2 %
HCT: 42 % (ref 36.0–46.0)
Hemoglobin: 13.2 g/dL (ref 12.0–15.0)
Immature Granulocytes: 0 %
Lymphocytes Relative: 35 %
Lymphs Abs: 2.8 10*3/uL (ref 0.7–4.0)
MCH: 28 pg (ref 26.0–34.0)
MCHC: 31.4 g/dL (ref 30.0–36.0)
MCV: 89.2 fL (ref 80.0–100.0)
Monocytes Absolute: 0.6 10*3/uL (ref 0.1–1.0)
Monocytes Relative: 7 %
Neutro Abs: 4.5 10*3/uL (ref 1.7–7.7)
Neutrophils Relative %: 56 %
Platelets: 276 10*3/uL (ref 150–400)
RBC: 4.71 MIL/uL (ref 3.87–5.11)
RDW: 13.9 % (ref 11.5–15.5)
WBC: 8.1 10*3/uL (ref 4.0–10.5)
nRBC: 0 % (ref 0.0–0.2)

## 2021-09-12 LAB — LIPASE, BLOOD: Lipase: 73 U/L — ABNORMAL HIGH (ref 11–51)

## 2021-09-12 MED ORDER — MORPHINE SULFATE (PF) 4 MG/ML IV SOLN
4.0000 mg | Freq: Once | INTRAVENOUS | Status: AC
  Administered 2021-09-12: 4 mg via INTRAVENOUS
  Filled 2021-09-12: qty 1

## 2021-09-12 MED ORDER — MAALOX MAX 400-400-40 MG/5ML PO SUSP: 10.0000 mL | Freq: Four times a day (QID) | ORAL | 0 refills | Status: DC | PRN

## 2021-09-12 MED ORDER — DICYCLOMINE HCL 20 MG PO TABS: 20.0000 mg | ORAL_TABLET | Freq: Three times a day (TID) | ORAL | 0 refills | Status: DC | PRN

## 2021-09-12 MED ORDER — SODIUM CHLORIDE 0.9 % IV BOLUS
500.0000 mL | Freq: Once | INTRAVENOUS | Status: AC
  Administered 2021-09-12: 500 mL via INTRAVENOUS

## 2021-09-12 MED ORDER — ONDANSETRON 4 MG PO TBDP: 4.0000 mg | ORAL_TABLET | Freq: Three times a day (TID) | ORAL | 0 refills | Status: DC | PRN

## 2021-09-12 MED ORDER — ONDANSETRON HCL 4 MG/2ML IJ SOLN
4.0000 mg | Freq: Once | INTRAMUSCULAR | Status: AC
  Administered 2021-09-12: 4 mg via INTRAVENOUS
  Filled 2021-09-12: qty 2

## 2021-09-12 MED ORDER — SENNOSIDES-DOCUSATE SODIUM 8.6-50 MG PO TABS: 1.0000 | ORAL_TABLET | Freq: Every evening | ORAL | 0 refills | Status: DC | PRN

## 2021-09-12 MED ORDER — IOHEXOL 300 MG/ML  SOLN
70.0000 mL | Freq: Once | INTRAMUSCULAR | Status: AC | PRN
  Administered 2021-09-12: 70 mL via INTRAVENOUS

## 2021-09-12 NOTE — ED Triage Notes (Signed)
Pt c/o right sided abdominal pain with nausea and headache onset last thursday. Pt reports no bowel movement since Thursday. Pain is worse at night.

## 2021-09-12 NOTE — Discharge Instructions (Signed)
You were seen in the emergency room today with abdominal pain.  Your CT scan shows a umbilical hernia but nothing on the right side to explain your pain symptoms.  Your lab work here is reassuring.  If you develop pain over the area of your hernia or bulging that will not decrease you should return to the ED for re-evaluation.  I am calling in several medications to help with your symptoms.  Have listed the name of a general surgeon and would like you to call their office tomorrow for the next available follow-up appointment.

## 2021-09-12 NOTE — ED Provider Notes (Signed)
Emergency Department Provider Note   I have reviewed the triage vital signs and the nursing notes.   HISTORY  Chief Complaint Abdominal Pain   HPI [name] is a 65 y.o. female with past medical history reviewed below presents to the emergency department with right-sided abdominal pain over the past 3 days.  Pain began suddenly and is nonradiating.  She denies any fevers or chills but is having persistent nausea.  She has not had a bowel movement since pain began.  She has prior history of multiple abdominal surgeries but no prior history of bowel obstruction.  She also has diverticulitis history.  She is not having radiation of pain into the chest or back.  No dysuria, hesitancy, urgency.    Past Medical History:  Diagnosis Date   Anemia    Blood transfusion 2010   associated with GI bleeding   CKD (chronic kidney disease) stage 2, GFR 60-89 ml/min    Depression    Diabetes mellitus    Diverticulosis of colon with hemorrhage 2010, 09/2011   H/O hiatal hernia    Helicobacter pylori gastritis 09/13/2013   Hx: UTI (urinary tract infection)    Hypertension    IBS (irritable bowel syndrome)    IDA (iron deficiency anemia)    Personal history of colonic polyps    adenomas since 2010   Renal cell carcinoma 2004   lt kidney removed   Stroke [facility]) 12/2017   05/2021    Patient Active Problem List   Diagnosis Date Noted   Anemia, normocytic normochromic 08/18/2021   CVA (cerebral vascular accident) ([place]) 05/19/2021   Trigger finger of right thumb 07/29/2020   Gastritis and gastroduodenitis 01/31/2020   Hiatal hernia 01/31/2020   AKI (acute kidney injury) ([place]) 10/20/2019   Hyperlipidemia    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   CKD (chronic kidney disease) stage 2, GFR 60-89 ml/min 02/23/2017   Sepsis ([place]) 08/06/2016   Diverticulitis 08/06/2016   Diverticulitis of intestine without perforation or abscess without bleeding    Hyperlipidemia  associated with type 2 diabetes mellitus ([place]) 07/19/2016   S/p nephrectomy 05/30/2016   IBS (irritable bowel syndrome) 05/30/2016   History of renal carcinoma 05/30/2016   Morbid obesity with BMI of 40.0-44.9, adult ([place]) 05/30/2016   Neuralgia of chest 12/30/2015   Atypical chest pain 12/01/2015   Physical exam 10/26/2015   Type 2 diabetes mellitus with stage 3 chronic kidney disease, without Trevaughn Schear-term current use of insulin ([place]) 07/28/2015   Maxillary sinusitis, acute 01/21/2015   Strain of left pectoralis muscle 09/26/2014   Benign paroxysmal positional vertigo 10/03/2013   Nausea with vomiting 10/03/2013   Bleeding nose 33/82/5053   Helicobacter pylori gastritis 09/13/2013   Pyuria 08/20/2013   Abnormal abdominal CT scan 08/20/2013   Abdominal cramping 08/16/2013   Back pain 07/31/2013   LUQ pain 07/31/2013   Laceration of hand 07/10/2013   Hidradenitis suppurativa of right axilla 05/31/2013   Renal cell carcinoma    Diverticulosis of colon with hemorrhage - recurrent 09/19/2011    Class: Acute   Diverticulosis of large intestine without perforation or abscess with bleeding 09/19/2011   OBESITY, MORBID 09/16/2009   Hypertension associated with diabetes ([place]) 06/23/2009   History of colonic polyps 12/12/2008    Past Surgical History:  Procedure Laterality Date   ABDOMINAL HYSTERECTOMY     ABDOMINAL HYSTERECTOMY     BIOPSY  10/23/2019   Procedure: BIOPSY;  Surgeon: Lavena Bullion, DO;  Location: [place];  Service: Gastroenterology;;   CHOLECYSTECTOMY     COLONOSCOPY  12/19/2008   diverticulosis   COLONOSCOPY  11/17/2011   Procedure: COLONOSCOPY;  Surgeon: Zenovia Jarred, MD;  Location: [facility];  Service: Gastroenterology;  Laterality: N/A;   DILATION AND CURETTAGE OF UTERUS N/A 07/11/2014   Procedure: Repair of Vaginal Cuff;  Surgeon: Melina Schools, MD;  Location: [place] [facility];  Service: Gynecology;  Laterality: N/A;   ESOPHAGOGASTRODUODENOSCOPY (EGD) WITH PROPOFOL N/A  10/23/2019   Procedure: ESOPHAGOGASTRODUODENOSCOPY (EGD) WITH PROPOFOL;  Surgeon: Lavena Bullion, DO;  Location: [place];  Service: Gastroenterology;  Laterality: N/A;   FLEXIBLE SIGMOIDOSCOPY  12/12/2008   diverticulosis   HAND SURGERY     x5 right   HYDRADENITIS EXCISION Right 03/21/2014   Procedure: EXCISION HIDRADENITIS AXILLA;  Surgeon: Pedro Earls, MD;  Location: [place];  Service: General;  Laterality: Right;   KNEE ARTHROSCOPY     x2 left   NEPHRECTOMY     left   TRIGGER FINGER RELEASE Left 06/23/2015   Procedure: RELEASE TRIGGER FINGER/A-1 PULLEY LEFT RING FINGER;  Surgeon: Daryll Brod, MD;  Location: [place];  Service: Orthopedics;  Laterality: Left;   TUBAL LIGATION     UPPER GASTROINTESTINAL ENDOSCOPY  12/12/2008   gastroporesis    Allergies Patient has no known allergies.  Family History  Problem Relation Age of Onset   Colon polyps Mother    Diabetes Maternal Grandmother    Breast cancer Maternal Grandmother    Diabetes Father    Breast cancer Daughter    Colon cancer Neg Hx     Social History Social History   Tobacco Use   Smoking status: Never   Smokeless tobacco: Never  Vaping Use   Vaping Use: Never used  Substance Use Topics   Alcohol use: No    Alcohol/week: 0.0 standard drinks   Drug use: No    Review of Systems  Constitutional: No fever/chills Eyes: No visual changes. ENT: No sore throat. Cardiovascular: Denies chest pain. Respiratory: Denies shortness of breath. Gastrointestinal: Positive right sided abdominal pain. Positive nausea, no vomiting.  No diarrhea.  Positive constipation. Genitourinary: Negative for dysuria. Musculoskeletal: Negative for back pain. Skin: Negative for rash. Neurological: Negative for headaches, focal weakness or numbness.  10-point ROS otherwise negative.  ____________________________________________   PHYSICAL EXAM:  VITAL SIGNS: ED Triage Vitals  Enc Vitals  Group     BP 09/12/21 0847 (!) 209/102     Pulse Rate 09/12/21 0847 88     Resp 09/12/21 0847 18     Temp 09/12/21 0847 98.4 F (36.9 C)     Temp Source 09/12/21 0847 Oral     SpO2 09/12/21 0847 100 %     Weight 09/12/21 0844 225 lb (102.1 kg)     Height 09/12/21 0844 5\' 1"  (1.549 m)   Constitutional: Alert and oriented. Well appearing and in no acute distress. Eyes: Conjunctivae are normal. Head: Atraumatic. Nose: No congestion/rhinnorhea. Mouth/Throat: Mucous membranes are moist.   Neck: No stridor.   Cardiovascular: Normal rate, regular rhythm. Good peripheral circulation. Grossly normal heart sounds.   Respiratory: Normal respiratory effort.  No retractions. Lungs CTAB. Gastrointestinal: Soft with focal RLQ tenderness. No rebound or guarding. No epigastric or left abdominal tenderness. No distention.  Musculoskeletal: No gross deformities of extremities. Neurologic:  Normal speech and language.  Skin:  Skin is warm, dry and intact. No rash noted.  ____________________________________________   LABS (all labs ordered are listed, but  only abnormal results are displayed)  Labs Reviewed  COMPREHENSIVE METABOLIC PANEL - Abnormal; Notable for the following components:      Result Value   Glucose, Bld 124 (*)    BUN 28 (*)    Creatinine, Ser 1.43 (*)    Albumin 3.4 (*)    Total Bilirubin 0.2 (*)    GFR, Estimated 41 (*)    All other components within normal limits  LIPASE, BLOOD - Abnormal; Notable for the following components:   Lipase 73 (*)    All other components within normal limits  CBC WITH DIFFERENTIAL/PLATELET   ____________________________________________  RADIOLOGY  CT abdomen/pelvis reviewed.   ____________________________________________   PROCEDURES  Procedure(s) performed:   Procedures  None  ____________________________________________   INITIAL IMPRESSION / ASSESSMENT AND PLAN / ED COURSE  Pertinent labs & imaging results that were  available during my care of the patient were reviewed by me and considered in my medical decision making (see chart for details).   Patient presents emergency department with right abdominal discomfort for the past 3 days.  She had some associated nausea and has not having bowel movements.  She has prior history of multiple abdominal surgeries.  Suspicion is lower for small bowel obstruction.  She is not having any urinary tract infection or pyelonephritis symptoms other than pain in the right lower abdomen.  No vaginal bleeding or discharge to suspect GU cause of pain.  Patient is status post hysterectomy.  Patient also has prior surgical history including cholecystectomy.  Will obtain CT abdomen pelvis with IV contrast along with labs and pain control measures here.   11:15 AM  CT imaging reviewed.  Patient has a periumbilical hernia.  There is no sign of obstruction on CT. patient is feeling much better after pain medication.  Patient notes that she does have a known history of hernia in this location.  On reexamination I am able to press on the hernia without patient experiencing pain.  Doubt this is the source of her discomfort.  I will give her contact information for general surgery for follow-up. Discussed strict ED return precautions.  ____________________________________________  FINAL CLINICAL IMPRESSION(S) / ED DIAGNOSES  Final diagnoses:  Right lower quadrant abdominal pain  Periumbilical hernia     MEDICATIONS GIVEN DURING THIS VISIT:  Medications  morphine 4 MG/ML injection 4 mg (4 mg Intravenous Given 09/12/21 0925)  ondansetron (ZOFRAN) injection 4 mg (4 mg Intravenous Given 09/12/21 0924)  sodium chloride 0.9 % bolus 500 mL ( Intravenous Stopped 09/12/21 0929)  iohexol (OMNIPAQUE) 300 MG/ML solution 70 mL (70 mLs Intravenous Contrast Given 09/12/21 1011)     NEW OUTPATIENT MEDICATIONS STARTED DURING THIS VISIT:  Discharge Medication List as of 09/12/2021 11:20 AM      START taking these medications   Details  ondansetron (ZOFRAN ODT) 4 MG disintegrating tablet Take 1 tablet (4 mg total) by mouth every 8 (eight) hours as needed., Starting Sun 09/12/2021, Normal    senna-docusate (SENOKOT-S) 8.6-50 MG tablet Take 1 tablet by mouth at bedtime as needed for mild constipation or moderate constipation., Starting Sun 09/12/2021, Normal    dicyclomine (BENTYL) 20 MG tablet Take 1 tablet (20 mg total) by mouth 3 (three) times daily as needed., Starting Sun 09/12/2021, Normal        Note:  This document was prepared using Dragon voice recognition software and may include unintentional dictation errors.  Nanda Quinton, MD, [facility]    Ladaisha Portillo, W[facility], MD 09/16/21 [phone]

## 2021-09-13 ENCOUNTER — Telehealth (HOSPITAL_BASED_OUTPATIENT_CLINIC_OR_DEPARTMENT_OTHER): Payer: Self-pay | Admitting: Student

## 2021-09-13 MED ORDER — HYOSCYAMINE SULFATE SL 0.125 MG SL SUBL: 0.1250 mg | SUBLINGUAL_TABLET | Freq: Two times a day (BID) | SUBLINGUAL | 0 refills | Status: DC | PRN

## 2021-09-13 NOTE — Telephone Encounter (Signed)
Patient called requesting something for abdominal cramping.  Patient states that Bentyl makes her sick.  Note created to document setting of Levsin to pharmacy.

## 2021-12-03 ENCOUNTER — Telehealth: Payer: Self-pay

## 2021-12-03 NOTE — Telephone Encounter (Signed)
Called patient to discuss Prevail study with no answer.Voice message left asking for a return call

## 2021-12-27 ENCOUNTER — Ambulatory Visit: Payer: Self-pay | Admitting: Surgery

## 2022-01-03 ENCOUNTER — Encounter (HOSPITAL_COMMUNITY): Payer: Self-pay

## 2022-01-03 NOTE — Patient Instructions (Addendum)
DUE TO COVID-19 ONLY ONE VISITOR IS ALLOWED TO COME WITH YOU AND STAY IN THE WAITING ROOM ONLY DURING PRE OP AND PROCEDURE DAY OF SURGERY IF YOU ARE GOING HOME AFTER SURGERY. IF YOU ARE SPENDING THE NIGHT 2 PEOPLE MAY VISIT WITH YOU IN YOUR PRIVATE ROOM AFTER SURGERY UNTIL VISITING  HOURS ARE OVER AT 8:00 PM AND 1  VISITOR  MAY  SPEND THE NIGHT.   YOU NEED TO HAVE A COVID 19 TEST ON__3/8_____ @__8 :15_____, THIS TEST MUST BE DONE BEFORE SURGERY,                  Annessa Satre    Your procedure is scheduled on: 01/21/22   Report to [facility] Main  Entrance   Report to admitting at  10:45 AM     Call this number if you have problems the morning of surgery [phone]    Remember: Do not eat food  :After Midnight the night before your surgery,   You may have clear liquids from midnight until 10:00 am    CLEAR LIQUID DIET   Foods Allowed                                                                     Foods Excluded  Coffee and tea, regular and decaf                             liquids that you cannot  Plain Jell-O any favor except red or purple                                           see through such as: Fruit ices (not with fruit pulp)                                     milk, soups, orange juice  Iced Popsicles                                    All solid food Carbonated beverages, regular and diet                                    Cranberry, grape and apple juices Sports drinks like Gatorade Lightly seasoned clear broth or consume(fat free) Sugar    BRUSH YOUR TEETH MORNING OF SURGERY AND RINSE YOUR MOUTH OUT, NO CHEWING GUM CANDY OR MINTS.     Take these medicines the morning of surgery with A SIP OF WATER: Amlodipine, Use your inhalers and bring them with you   Stop taking _Plavix__________on __________as instructed by _____________.  Stop taking ____________as directed by your Surgeon/Cardiologist.  Contact your Surgeon/Cardiologist for  instructions on Anticoagulant Therapy prior to surgery.    How to Manage Your Diabetes Before and After Surgery  Why is it important to control my blood sugar before and after surgery?  Improving blood sugar levels before and after surgery helps healing and can limit problems. A way of improving blood sugar control is eating a healthy diet by:  Eating less sugar and carbohydrates  Increasing activity/exercise  Talking with your doctor about reaching your blood sugar goals High blood sugars (greater than 180 mg/dL) can raise your risk of infections and slow your recovery, so you will need to focus on controlling your diabetes during the weeks before surgery. Make sure that the doctor who takes care of your diabetes knows about your planned surgery including the date and location.  How do I manage my blood sugar before surgery? Check your blood sugar at least 4 times a day, starting 2 days before surgery, to make sure that the level is not too high or low. Check your blood sugar the morning of your surgery when you wake up and every 2 hours until you get to the Short Stay unit. If your blood sugar is less than 70 mg/dL, you will need to treat for low blood sugar: Do not take insulin. Treat a low blood sugar (less than 70 mg/dL) with  cup of clear juice (cranberry or apple), 4 glucose tablets, OR glucose gel. Recheck blood sugar in 15 minutes after treatment (to make sure it is greater than 70 mg/dL). If your blood sugar is not greater than 70 mg/dL on recheck, call [phone] for further instructions. Report your blood sugar to the short stay nurse when you get to Short Stay.  If you are admitted to the hospital after surgery: Your blood sugar will be checked by the staff and you will probably be given insulin after surgery (instead of oral diabetes medicines) to make sure you have good blood sugar levels. The goal for blood sugar control after surgery is 80-180 mg/dL.   WHAT DO I DO  ABOUT MY DIABETES MEDICATION?  Do not take oral diabetes medicines (pills) the morning of surgery.   The day of surgery, do not take other diabetes injectables, including Byetta (exenatide), Bydureon (exenatide ER), Victoza (liraglutide), or Trulicity (dulaglutide).                                You may not have any metal on your body including hair pins and              piercings  Do not wear jewelry, make-up, lotions, powders or perfumes, deodorant             Do not wear nail polish on your fingernails.  Do not shave  48 hours prior to surgery.                 Do not bring valuables to the hospital. [place].  Contacts, dentures or bridgework may not be worn into surgery.  Leave suitcase in the car. After surgery it may be brought to your room.               [place] - Preparing for Surgery Before surgery, you can play an important role.  Because skin is not sterile, your skin needs to be as free of germs as possible.  You can reduce the number of germs on your skin by washing with CHG (chlorahexidine gluconate) soap before surgery.  CHG is an antiseptic cleaner which kills germs and  bonds with the skin to continue killing germs even after washing. Please DO NOT use if you have an allergy to CHG or antibacterial soaps.  If your skin becomes reddened/irritated stop using the CHG and inform your nurse when you arrive at Short Stay. Do not shave (including legs and underarms) for at least 48 hours prior to the first CHG shower.   Please follow these instructions carefully:  1.  Shower with CHG Soap the night before surgery and the  morning of Surgery.  2.  If you choose to wash your hair, wash your hair first as usual with your  normal  shampoo.  3.  After you shampoo, rinse your hair and body thoroughly to remove the  shampoo.                            4.  Use CHG as you would any other liquid soap.  You can apply chg directly  to  the skin and wash                       Gently with a scrungie or clean washcloth.  5.  Apply the CHG Soap to your body ONLY FROM THE NECK DOWN.   Do not use on face/ open                           Wound or open sores. Avoid contact with eyes, ears mouth and genitals (private parts).                       Wash face,  Genitals (private parts) with your normal soap.             6.  Wash thoroughly, paying special attention to the area where your surgery  will be performed.  7.  Thoroughly rinse your body with warm water from the neck down.  8.  DO NOT shower/wash with your normal soap after using and rinsing off  the CHG Soap.                9.  Pat yourself dry with a clean towel.            10.  Wear clean pajamas.            11.  Place clean sheets on your bed the night of your first shower and do not  sleep with pets. Day of Surgery : Do not apply any lotions/deodorants the morning of surgery.  Please wear clean clothes to the hospital/surgery center.  FAILURE TO FOLLOW THESE INSTRUCTIONS MAY RESULT IN THE CANCELLATION OF YOUR SURGERY PATIENT SIGNATURE_________________________________  NURSE SIGNATURE__________________________________  ________________________________________________________________________

## 2022-01-04 ENCOUNTER — Encounter (HOSPITAL_COMMUNITY)
Admission: RE | Admit: 2022-01-04 | Discharge: 2022-01-04 | Disposition: A | Discharge status: Home / Self Care | Payer: Medicare Other | Source: Ambulatory Visit | Attending: Surgery | Admitting: Surgery

## 2022-01-04 ENCOUNTER — Encounter (HOSPITAL_COMMUNITY): Payer: Self-pay

## 2022-01-04 ENCOUNTER — Other Ambulatory Visit: Payer: Self-pay

## 2022-01-04 VITALS — BP 176/81 | HR 94 | Temp 98.6°F | Resp 18 | Ht 61.0 in | Wt 218.0 lb

## 2022-01-04 DIAGNOSIS — E1122 Type 2 diabetes mellitus with diabetic chronic kidney disease: Secondary | ICD-10-CM | POA: Insufficient documentation

## 2022-01-04 DIAGNOSIS — Z20822 Contact with and (suspected) exposure to covid-19: Secondary | ICD-10-CM | POA: Insufficient documentation

## 2022-01-04 DIAGNOSIS — N1832 Chronic kidney disease, stage 3b: Secondary | ICD-10-CM | POA: Diagnosis not present

## 2022-01-04 DIAGNOSIS — Z01812 Encounter for preprocedural laboratory examination: Secondary | ICD-10-CM | POA: Diagnosis not present

## 2022-01-04 DIAGNOSIS — Z01818 Encounter for other preprocedural examination: Secondary | ICD-10-CM

## 2022-01-04 DIAGNOSIS — N1831 Chronic kidney disease, stage 3a: Secondary | ICD-10-CM

## 2022-01-04 LAB — BASIC METABOLIC PANEL
Anion gap: 6 (ref 5–15)
BUN: 37 mg/dL — ABNORMAL HIGH (ref 8–23)
CO2: 26 mmol/L (ref 22–32)
Calcium: 9.4 mg/dL (ref 8.9–10.3)
Chloride: 107 mmol/L (ref 98–111)
Creatinine, Ser: 1.3 mg/dL — ABNORMAL HIGH (ref 0.44–1.00)
GFR, Estimated: 46 mL/min — ABNORMAL LOW (ref >60)
Glucose, Bld: 95 mg/dL (ref 70–99)
Potassium: 4.2 mmol/L (ref 3.5–5.1)
Sodium: 139 mmol/L (ref 135–145)

## 2022-01-04 LAB — CBC
HCT: 42.2 % (ref 36.0–46.0)
Hemoglobin: 13 g/dL (ref 12.0–15.0)
MCH: 27.7 pg (ref 26.0–34.0)
MCHC: 30.8 g/dL (ref 30.0–36.0)
MCV: 89.8 fL (ref 80.0–100.0)
Platelets: 257 10*3/uL (ref 150–400)
RBC: 4.7 MIL/uL (ref 3.87–5.11)
RDW: 14.9 % (ref 11.5–15.5)
WBC: 7.9 10*3/uL (ref 4.0–10.5)
nRBC: 0 % (ref 0.0–0.2)

## 2022-01-04 LAB — SURGICAL PCR SCREEN
MRSA, PCR: NEGATIVE
Staphylococcus aureus: NEGATIVE

## 2022-01-04 LAB — GLUCOSE, CAPILLARY: Glucose-Capillary: 85 mg/dL (ref 70–99)

## 2022-01-04 NOTE — Progress Notes (Signed)
COVID test- 01/19/22 at 8:15    Bowel prep reminder:NA  PCP - Dr. Mariann Laster Cardiologist - Dr. Clayton Bibles. [place]  Chest x-ray - no EKG - 05/19/21-epic Stress Test - no ECHO - 05/19/21-epic Cardiac Cath - no Pacemaker/ICD device last checked:NA  Sleep Study - no CPAP -   Fasting Blood Sugar - 120-152 Checks Blood Sugar _BID____ times a day  Blood Thinner Instructions:Plavix/ Dr. Coletta Memos Aspirin Instructions:Stop 5 days prior to DOS/ Dr. Thermon Leyland Last Dose:01/14/22  Anesthesia review: yes  Patient denies shortness of breath, fever, cough and chest pain at PAT appointment Pt reports no SOB with activities. She was placed on an external heart monitor 12/20/21 and it will come off 01/20/22. 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  Patient verbalized understanding of instructions that were given to them at the PAT appointment. Patient was also instructed that they will need to review over the PAT instructions again at home before surgery. yes

## 2022-01-05 LAB — HEMOGLOBIN A1C
Hgb A1c MFr Bld: 6.9 % — ABNORMAL HIGH (ref 4.8–5.6)
Mean Plasma Glucose: 151 mg/dL

## 2022-01-19 ENCOUNTER — Other Ambulatory Visit: Payer: Self-pay

## 2022-01-19 ENCOUNTER — Encounter (HOSPITAL_COMMUNITY)
Admission: RE | Admit: 2022-01-19 | Discharge: 2022-01-19 | Disposition: A | Discharge status: Home / Self Care | Payer: Medicare Other | Source: Ambulatory Visit | Attending: Surgery | Admitting: Surgery

## 2022-01-19 DIAGNOSIS — Z20822 Contact with and (suspected) exposure to covid-19: Secondary | ICD-10-CM | POA: Insufficient documentation

## 2022-01-19 DIAGNOSIS — Z01812 Encounter for preprocedural laboratory examination: Secondary | ICD-10-CM | POA: Insufficient documentation

## 2022-01-19 LAB — SARS CORONAVIRUS 2 (TAT 6-24 HRS): SARS Coronavirus 2: NEGATIVE

## 2022-01-21 ENCOUNTER — Other Ambulatory Visit: Payer: Self-pay

## 2022-01-21 ENCOUNTER — Observation Stay (HOSPITAL_COMMUNITY)
Admission: RE | Admit: 2022-01-21 | Discharge: 2022-01-23 | Disposition: A | Discharge status: Home / Self Care | Payer: Medicare Other | Attending: Surgery | Admitting: Surgery

## 2022-01-21 ENCOUNTER — Ambulatory Visit (HOSPITAL_COMMUNITY): Payer: Medicare Other | Admitting: Physician Assistant

## 2022-01-21 ENCOUNTER — Ambulatory Visit (HOSPITAL_BASED_OUTPATIENT_CLINIC_OR_DEPARTMENT_OTHER): Payer: Medicare Other | Admitting: Certified Registered"

## 2022-01-21 ENCOUNTER — Encounter (HOSPITAL_COMMUNITY)
Admission: RE | Disposition: A | Discharge status: Home / Self Care | Payer: Self-pay | Source: Home / Self Care | Attending: Surgery

## 2022-01-21 ENCOUNTER — Encounter (HOSPITAL_COMMUNITY): Payer: Self-pay | Admitting: Surgery

## 2022-01-21 DIAGNOSIS — E1122 Type 2 diabetes mellitus with diabetic chronic kidney disease: Secondary | ICD-10-CM

## 2022-01-21 DIAGNOSIS — E669 Obesity, unspecified: Secondary | ICD-10-CM | POA: Diagnosis not present

## 2022-01-21 DIAGNOSIS — I129 Hypertensive chronic kidney disease with stage 1 through stage 4 chronic kidney disease, or unspecified chronic kidney disease: Secondary | ICD-10-CM

## 2022-01-21 DIAGNOSIS — N182 Chronic kidney disease, stage 2 (mild): Secondary | ICD-10-CM | POA: Insufficient documentation

## 2022-01-21 DIAGNOSIS — Z8673 Personal history of transient ischemic attack (TIA), and cerebral infarction without residual deficits: Secondary | ICD-10-CM | POA: Insufficient documentation

## 2022-01-21 DIAGNOSIS — A419 Sepsis, unspecified organism: Secondary | ICD-10-CM | POA: Diagnosis not present

## 2022-01-21 DIAGNOSIS — K432 Incisional hernia without obstruction or gangrene: Secondary | ICD-10-CM

## 2022-01-21 DIAGNOSIS — N189 Chronic kidney disease, unspecified: Secondary | ICD-10-CM

## 2022-01-21 DIAGNOSIS — Z7984 Long term (current) use of oral hypoglycemic drugs: Secondary | ICD-10-CM | POA: Diagnosis not present

## 2022-01-21 DIAGNOSIS — Z79899 Other long term (current) drug therapy: Secondary | ICD-10-CM | POA: Insufficient documentation

## 2022-01-21 DIAGNOSIS — Z85528 Personal history of other malignant neoplasm of kidney: Secondary | ICD-10-CM | POA: Diagnosis not present

## 2022-01-21 LAB — CBC
HCT: 39.4 % (ref 36.0–46.0)
Hemoglobin: 12.1 g/dL (ref 12.0–15.0)
MCH: 28.5 pg (ref 26.0–34.0)
MCHC: 30.7 g/dL (ref 30.0–36.0)
MCV: 92.7 fL (ref 80.0–100.0)
Platelets: 233 10*3/uL (ref 150–400)
RBC: 4.25 MIL/uL (ref 3.87–5.11)
RDW: 14.7 % (ref 11.5–15.5)
WBC: 12.6 10*3/uL — ABNORMAL HIGH (ref 4.0–10.5)
nRBC: 0 % (ref 0.0–0.2)

## 2022-01-21 LAB — GLUCOSE, CAPILLARY
Glucose-Capillary: 74 mg/dL (ref 70–99)
Glucose-Capillary: 140 mg/dL — ABNORMAL HIGH (ref 70–99)
Glucose-Capillary: 158 mg/dL — ABNORMAL HIGH (ref 70–99)

## 2022-01-21 LAB — CREATININE, SERUM
Creatinine, Ser: 1.49 mg/dL — ABNORMAL HIGH (ref 0.44–1.00)
GFR, Estimated: 39 mL/min — ABNORMAL LOW (ref >60)

## 2022-01-21 SURGERY — REPAIR, HERNIA, VENTRAL, ROBOT-ASSISTED
Anesthesia: General

## 2022-01-21 MED ORDER — FENTANYL CITRATE PF 50 MCG/ML IJ SOSY
PREFILLED_SYRINGE | INTRAMUSCULAR | Status: AC
  Filled 2022-01-21: qty 2

## 2022-01-21 MED ORDER — HYDROCHLOROTHIAZIDE 25 MG PO TABS
25.0000 mg | ORAL_TABLET | Freq: Every day | ORAL | Status: DC
  Administered 2022-01-22 – 2022-01-23 (×2): 25 mg via ORAL
  Filled 2022-01-21 (×2): qty 1

## 2022-01-21 MED ORDER — ROCURONIUM BROMIDE 10 MG/ML (PF) SYRINGE
PREFILLED_SYRINGE | INTRAVENOUS | Status: AC
  Filled 2022-01-21: qty 10

## 2022-01-21 MED ORDER — ALBUTEROL SULFATE HFA 108 (90 BASE) MCG/ACT IN AERS
2.0000 | INHALATION_SPRAY | Freq: Four times a day (QID) | RESPIRATORY_TRACT | Status: DC | PRN
  Filled 2022-01-21: qty 6.7

## 2022-01-21 MED ORDER — SUGAMMADEX SODIUM 200 MG/2ML IV SOLN
INTRAVENOUS | Status: DC | PRN
  Administered 2022-01-21: 200 mg via INTRAVENOUS

## 2022-01-21 MED ORDER — MIDAZOLAM HCL 2 MG/2ML IJ SOLN
INTRAMUSCULAR | Status: AC
  Filled 2022-01-21: qty 2

## 2022-01-21 MED ORDER — FENTANYL CITRATE (PF) 100 MCG/2ML IJ SOLN
INTRAMUSCULAR | Status: AC
  Filled 2022-01-21: qty 2

## 2022-01-21 MED ORDER — ACETAMINOPHEN 500 MG PO TABS
1000.0000 mg | ORAL_TABLET | ORAL | Status: AC
  Administered 2022-01-21: 1000 mg via ORAL
  Filled 2022-01-21: qty 2

## 2022-01-21 MED ORDER — LISINOPRIL 20 MG PO TABS
40.0000 mg | ORAL_TABLET | Freq: Every day | ORAL | Status: DC
  Administered 2022-01-22 – 2022-01-23 (×2): 40 mg via ORAL
  Filled 2022-01-21 (×2): qty 2

## 2022-01-21 MED ORDER — SIMETHICONE 80 MG PO CHEW: 80.0000 mg | CHEWABLE_TABLET | Freq: Four times a day (QID) | ORAL | Status: DC | PRN

## 2022-01-21 MED ORDER — PROPOFOL 10 MG/ML IV BOLUS
INTRAVENOUS | Status: AC
  Filled 2022-01-21: qty 20

## 2022-01-21 MED ORDER — BUPIVACAINE LIPOSOME 1.3 % IJ SUSP
INTRAMUSCULAR | Status: AC
  Filled 2022-01-21: qty 20

## 2022-01-21 MED ORDER — OXYCODONE HCL 5 MG PO TABS
10.0000 mg | ORAL_TABLET | ORAL | Status: DC | PRN
Start: 2022-01-21 — End: 2022-01-23
  Administered 2022-01-21 – 2022-01-22 (×2): 10 mg via ORAL
  Filled 2022-01-21 (×2): qty 2

## 2022-01-21 MED ORDER — BUPIVACAINE-EPINEPHRINE 0.25% -1:200000 IJ SOLN
INTRAMUSCULAR | Status: DC | PRN
  Administered 2022-01-21: 30 mL

## 2022-01-21 MED ORDER — DEXAMETHASONE SODIUM PHOSPHATE 10 MG/ML IJ SOLN
INTRAMUSCULAR | Status: AC
  Filled 2022-01-21: qty 1

## 2022-01-21 MED ORDER — DOCUSATE SODIUM 100 MG PO CAPS
100.0000 mg | ORAL_CAPSULE | Freq: Two times a day (BID) | ORAL | Status: DC
Start: 2022-01-21 — End: 2022-01-23
  Administered 2022-01-21 – 2022-01-23 (×4): 100 mg via ORAL
  Filled 2022-01-21 (×4): qty 1

## 2022-01-21 MED ORDER — INSULIN ASPART 100 UNIT/ML IJ SOLN: 0.0000 [IU] | Freq: Every day | INTRAMUSCULAR | Status: DC

## 2022-01-21 MED ORDER — LIDOCAINE 2% (20 MG/ML) 5 ML SYRINGE
INTRAMUSCULAR | Status: DC | PRN
  Administered 2022-01-21: 60 mg via INTRAVENOUS

## 2022-01-21 MED ORDER — ROCURONIUM BROMIDE 10 MG/ML (PF) SYRINGE
PREFILLED_SYRINGE | INTRAVENOUS | Status: DC | PRN
  Administered 2022-01-21: 50 mg via INTRAVENOUS
  Administered 2022-01-21: 20 mg via INTRAVENOUS
  Administered 2022-01-21: 30 mg via INTRAVENOUS

## 2022-01-21 MED ORDER — OXYCODONE HCL 5 MG PO TABS
5.0000 mg | ORAL_TABLET | ORAL | Status: DC | PRN
Start: 2022-01-21 — End: 2022-01-23
  Administered 2022-01-22 – 2022-01-23 (×2): 5 mg via ORAL
  Filled 2022-01-21 (×2): qty 1

## 2022-01-21 MED ORDER — ENOXAPARIN SODIUM 40 MG/0.4ML IJ SOSY
40.0000 mg | PREFILLED_SYRINGE | Freq: Once | INTRAMUSCULAR | Status: AC
  Administered 2022-01-21: 40 mg via SUBCUTANEOUS
  Filled 2022-01-21: qty 0.4

## 2022-01-21 MED ORDER — BUPIVACAINE LIPOSOME 1.3 % IJ SUSP: 20.0000 mL | Freq: Once | INTRAMUSCULAR | Status: DC

## 2022-01-21 MED ORDER — AZELASTINE HCL 0.1 % NA SOLN
1.0000 | Freq: Two times a day (BID) | NASAL | Status: DC | PRN
  Filled 2022-01-21: qty 30

## 2022-01-21 MED ORDER — GABAPENTIN 300 MG PO CAPS
300.0000 mg | ORAL_CAPSULE | Freq: Three times a day (TID) | ORAL | Status: DC
Start: 2022-01-21 — End: 2022-01-23
  Filled 2022-01-21 (×3): qty 1

## 2022-01-21 MED ORDER — LACTATED RINGERS IV SOLN: INTRAVENOUS | Status: DC

## 2022-01-21 MED ORDER — AMLODIPINE BESYLATE 10 MG PO TABS
10.0000 mg | ORAL_TABLET | Freq: Every day | ORAL | Status: DC
  Administered 2022-01-22 – 2022-01-23 (×2): 10 mg via ORAL
  Filled 2022-01-21 (×2): qty 1

## 2022-01-21 MED ORDER — CHLORHEXIDINE GLUCONATE CLOTH 2 % EX PADS: 6.0000 | MEDICATED_PAD | Freq: Once | CUTANEOUS | Status: DC

## 2022-01-21 MED ORDER — EXENATIDE ER 2 MG ~~LOC~~ PEN: 2.0000 mg | PEN_INJECTOR | SUBCUTANEOUS | Status: DC

## 2022-01-21 MED ORDER — PHENYLEPHRINE HCL-NACL 20-0.9 MG/250ML-% IV SOLN
INTRAVENOUS | Status: DC | PRN
  Administered 2022-01-21: 60 ug/min via INTRAVENOUS

## 2022-01-21 MED ORDER — AMISULPRIDE (ANTIEMETIC) 5 MG/2ML IV SOLN: 10.0000 mg | Freq: Once | INTRAVENOUS | Status: DC | PRN

## 2022-01-21 MED ORDER — INSULIN ASPART 100 UNIT/ML IJ SOLN
0.0000 [IU] | Freq: Three times a day (TID) | INTRAMUSCULAR | Status: DC
  Administered 2022-01-22 (×2): 2 [IU] via SUBCUTANEOUS
  Administered 2022-01-22: 3 [IU] via SUBCUTANEOUS

## 2022-01-21 MED ORDER — DEXAMETHASONE SODIUM PHOSPHATE 10 MG/ML IJ SOLN
INTRAMUSCULAR | Status: DC | PRN
  Administered 2022-01-21: 4 mg via INTRAVENOUS

## 2022-01-21 MED ORDER — ONDANSETRON HCL 4 MG/2ML IJ SOLN
INTRAMUSCULAR | Status: AC
  Filled 2022-01-21: qty 2

## 2022-01-21 MED ORDER — METHOCARBAMOL 500 MG IVPB - SIMPLE MED
500.0000 mg | Freq: Four times a day (QID) | INTRAVENOUS | Status: DC | PRN
Start: 2022-01-21 — End: 2022-01-23
  Filled 2022-01-21: qty 50

## 2022-01-21 MED ORDER — ONDANSETRON HCL 4 MG/2ML IJ SOLN: 4.0000 mg | Freq: Four times a day (QID) | INTRAMUSCULAR | Status: DC | PRN

## 2022-01-21 MED ORDER — BUPIVACAINE LIPOSOME 1.3 % IJ SUSP
INTRAMUSCULAR | Status: DC | PRN
  Administered 2022-01-21: 20 mL

## 2022-01-21 MED ORDER — LISINOPRIL-HYDROCHLOROTHIAZIDE 20-12.5 MG PO TABS: 2.0000 | ORAL_TABLET | Freq: Every day | ORAL | Status: DC

## 2022-01-21 MED ORDER — 0.9 % SODIUM CHLORIDE (POUR BTL) OPTIME
TOPICAL | Status: DC | PRN
  Administered 2022-01-21: 1000 mL

## 2022-01-21 MED ORDER — FENTANYL CITRATE (PF) 250 MCG/5ML IJ SOLN
INTRAMUSCULAR | Status: DC | PRN
  Administered 2022-01-21 (×2): 25 ug via INTRAVENOUS
  Administered 2022-01-21: 100 ug via INTRAVENOUS

## 2022-01-21 MED ORDER — MIDAZOLAM HCL 2 MG/2ML IJ SOLN
INTRAMUSCULAR | Status: DC | PRN
Start: 2022-01-21 — End: 2022-01-21
  Administered 2022-01-21: 2 mg via INTRAVENOUS

## 2022-01-21 MED ORDER — SCOPOLAMINE 1 MG/3DAYS TD PT72: 1.0000 | MEDICATED_PATCH | TRANSDERMAL | Status: DC

## 2022-01-21 MED ORDER — HYDROMORPHONE HCL 1 MG/ML IJ SOLN: 0.5000 mg | INTRAMUSCULAR | Status: DC | PRN

## 2022-01-21 MED ORDER — CHLORHEXIDINE GLUCONATE 0.12 % MT SOLN
15.0000 mL | Freq: Once | OROMUCOSAL | Status: AC
  Administered 2022-01-21: 15 mL via OROMUCOSAL

## 2022-01-21 MED ORDER — ACETAMINOPHEN 325 MG PO TABS
650.0000 mg | ORAL_TABLET | Freq: Four times a day (QID) | ORAL | Status: DC
  Administered 2022-01-21: 650 mg via ORAL
  Filled 2022-01-21 (×2): qty 2

## 2022-01-21 MED ORDER — BUPIVACAINE-EPINEPHRINE (PF) 0.25% -1:200000 IJ SOLN
INTRAMUSCULAR | Status: AC
  Filled 2022-01-21: qty 30

## 2022-01-21 MED ORDER — FENTANYL CITRATE PF 50 MCG/ML IJ SOSY
25.0000 ug | PREFILLED_SYRINGE | INTRAMUSCULAR | Status: DC | PRN
  Administered 2022-01-21 (×2): 50 ug via INTRAVENOUS

## 2022-01-21 MED ORDER — ORAL CARE MOUTH RINSE: 15.0000 mL | Freq: Once | OROMUCOSAL | Status: AC

## 2022-01-21 MED ORDER — OXYCODONE-ACETAMINOPHEN 5-325 MG PO TABS: 1.0000 | ORAL_TABLET | ORAL | 0 refills | Status: AC | PRN

## 2022-01-21 MED ORDER — ENOXAPARIN SODIUM 40 MG/0.4ML IJ SOSY
40.0000 mg | PREFILLED_SYRINGE | INTRAMUSCULAR | Status: DC
  Administered 2022-01-22: 40 mg via SUBCUTANEOUS
  Filled 2022-01-21: qty 0.4

## 2022-01-21 MED ORDER — PROCHLORPERAZINE EDISYLATE 10 MG/2ML IJ SOLN
10.0000 mg | INTRAMUSCULAR | Status: DC | PRN
Start: 2022-01-21 — End: 2022-01-23

## 2022-01-21 MED ORDER — ONDANSETRON HCL 4 MG/2ML IJ SOLN
INTRAMUSCULAR | Status: DC | PRN
  Administered 2022-01-21: 4 mg via INTRAVENOUS

## 2022-01-21 MED ORDER — PROPOFOL 10 MG/ML IV BOLUS
INTRAVENOUS | Status: DC | PRN
  Administered 2022-01-21: 100 mg via INTRAVENOUS
  Administered 2022-01-21: 200 mg via INTRAVENOUS

## 2022-01-21 MED ORDER — CEFAZOLIN SODIUM-DEXTROSE 2-4 GM/100ML-% IV SOLN
2.0000 g | INTRAVENOUS | Status: AC
  Administered 2022-01-21: 2 g via INTRAVENOUS
  Filled 2022-01-21: qty 100

## 2022-01-21 MED ORDER — GABAPENTIN 300 MG PO CAPS
300.0000 mg | ORAL_CAPSULE | ORAL | Status: AC
  Administered 2022-01-21: 300 mg via ORAL
  Filled 2022-01-21: qty 1

## 2022-01-21 SURGICAL SUPPLY — 67 items
ADH SKN CLS APL DERMABOND .7 (GAUZE/BANDAGES/DRESSINGS)
APL PRP STRL LF DISP 70% ISPRP (MISCELLANEOUS) ×1
BAG COUNTER SPONGE SURGICOUNT (BAG) ×2 IMPLANT
BAG SPNG CNTER NS LX DISP (BAG) ×1
BAG SURGICOUNT SPONGE COUNTING (BAG) ×1
BLADE SURG SZ11 CARB STEEL (BLADE) ×3 IMPLANT
CHLORAPREP W/TINT 26 (MISCELLANEOUS) ×3 IMPLANT
COVER TIP SHEARS 8 DVNC (MISCELLANEOUS) ×1 IMPLANT
COVER TIP SHEARS 8MM DA VINCI (MISCELLANEOUS) ×3
DERMABOND ADVANCED (GAUZE/BANDAGES/DRESSINGS)
DERMABOND ADVANCED .7 DNX12 (GAUZE/BANDAGES/DRESSINGS) IMPLANT
DEVICE TROCAR PUNCTURE CLOSURE (ENDOMECHANICALS) IMPLANT
DRAPE ARM DVNC X/XI (DISPOSABLE) ×4 IMPLANT
DRAPE COLUMN DVNC XI (DISPOSABLE) ×1 IMPLANT
DRAPE DA VINCI XI ARM (DISPOSABLE) ×12
DRAPE DA VINCI XI COLUMN (DISPOSABLE) ×3
ELECT L-HOOK LAP 45CM DISP (ELECTROSURGICAL) ×3
ELECT PENCIL ROCKER SW 15FT (MISCELLANEOUS) ×3 IMPLANT
ELECT REM PT RETURN 15FT ADLT (MISCELLANEOUS) ×3 IMPLANT
ELECTRODE L-HOOK LAP 45CM DISP (ELECTROSURGICAL) ×1 IMPLANT
GLOVE SRG 8 PF TXTR STRL LF DI (GLOVE) ×2 IMPLANT
GLOVE SURG ENC MOIS LTX SZ7.5 (GLOVE) ×6 IMPLANT
GLOVE SURG UNDER POLY LF SZ8 (GLOVE) ×6
GOWN STRL REUS W/ TWL XL LVL3 (GOWN DISPOSABLE) IMPLANT
GOWN STRL REUS W/TWL XL LVL3 (GOWN DISPOSABLE) ×9 IMPLANT
GRASPER SUT TROCAR 14GX15 (MISCELLANEOUS) IMPLANT
IRRIG SUCT STRYKERFLOW 2 WTIP (MISCELLANEOUS)
IRRIGATION SUCT STRKRFLW 2 WTP (MISCELLANEOUS) IMPLANT
KIT BASIN OR (CUSTOM PROCEDURE TRAY) ×3 IMPLANT
KIT TURNOVER KIT A (KITS) IMPLANT
MANIFOLD NEPTUNE II (INSTRUMENTS) ×3 IMPLANT
MESH SOFT 12X12IN BARD (Mesh General) ×2 IMPLANT
NDL SPNL 18GX3.5 QUINCKE PK (NEEDLE) ×1 IMPLANT
NEEDLE SPNL 18GX3.5 QUINCKE PK (NEEDLE) ×3 IMPLANT
PACK CARDIOVASCULAR III (CUSTOM PROCEDURE TRAY) ×3 IMPLANT
SEAL CANN UNIV 5-8 DVNC XI (MISCELLANEOUS) ×3 IMPLANT
SEAL XI 5MM-8MM UNIVERSAL (MISCELLANEOUS) ×9
SEALER VESSEL DA VINCI XI (MISCELLANEOUS)
SEALER VESSEL EXT DVNC XI (MISCELLANEOUS) IMPLANT
SOL ANTI FOG 6CC (MISCELLANEOUS) ×1 IMPLANT
SOLUTION ANTI FOG 6CC (MISCELLANEOUS) ×2
SOLUTION ELECTROLUBE (MISCELLANEOUS) ×3 IMPLANT
SPIKE FLUID TRANSFER (MISCELLANEOUS) ×3 IMPLANT
SPONGE T-LAP 18X18 ~~LOC~~+RFID (SPONGE) ×3 IMPLANT
SUT MNCRL AB 4-0 PS2 18 (SUTURE) ×3 IMPLANT
SUT STRAFIX PDS 18 CTX (SUTURE) ×2 IMPLANT
SUT STRAFIX SPIRAL 2-0 3 (SUTURE) IMPLANT
SUT STRAFIX SPIRAL 2-0 5 (SUTURE) IMPLANT
SUT STRAFIX SPIRAL 2-0 9 (SUTURE) IMPLANT
SUT STRAFIX SYMMETRIC 0-0 12 (SUTURE)
SUT STRAFIX SYMMETRIC 0-0 18 (SUTURE)
SUT STRAFIX SYMMETRIC 0-0 24 (SUTURE)
SUT STRAFIX SYMMETRIC 1-0 12 (SUTURE) ×3
SUT STRAFIX SYMMETRIC 1-0 24 (SUTURE)
SUT VIC AB 0 CT1 36 (SUTURE) ×2 IMPLANT
SUTURE STRAFIX SYMMETRC 0-0 12 (SUTURE) IMPLANT
SUTURE STRAFIX SYMMETRC 0-0 18 (SUTURE) IMPLANT
SUTURE STRAFIX SYMMETRC 0-0 24 (SUTURE) IMPLANT
SUTURE STRAFIX SYMMETRC 1-0 12 (SUTURE) IMPLANT
SUTURE STRAFIX SYMMETRC 1-0 24 (SUTURE) IMPLANT
SYR 20ML LL LF (SYRINGE) ×3 IMPLANT
TOWEL OR 17X26 10 PK STRL BLUE (TOWEL DISPOSABLE) ×3 IMPLANT
TOWEL OR NON WOVEN STRL DISP B (DISPOSABLE) IMPLANT
TROCAR BLADELESS OPT 5 100 (ENDOMECHANICALS) IMPLANT
TROCAR XCEL 12X100 BLDLESS (ENDOMECHANICALS) ×3 IMPLANT
TROCAR Z-THREAD FIOS 5X100MM (TROCAR) ×3 IMPLANT
TUBING INSUFFLATION 10FT LAP (TUBING) ×3 IMPLANT

## 2022-01-21 NOTE — Discharge Instructions (Signed)
 VENTRAL HERNIA REPAIR POST OPERATIVE INSTRUCTIONS  Thinking Clearly  The anesthesia may cause you to feel different for 1 or 2 days. Do not drive, drink alcohol, or make any big decisions for at least 2 days.  Nutrition When you wake up, you will be able to drink small amounts of liquid. If you do not feel sick, you can slowly advance your diet to regular foods. Continue to drink lots of fluids, usually about 8 to 10 glasses per day. Eat a high-fiber diet so you don't strain during bowel movements. High-Fiber Foods Foods high in fiber include beans, bran cereals and whole-grain breads, peas, dried fruit (figs, apricots, and dates), raspberries, blackberries, strawberries, sweet corn, broccoli, baked potatoes with skin, plums, pears, apples, greens, and nuts. Activity Slowly increase your activity. Be sure to get up and walk every hour or so to prevent blood clots. No heavy lifting or strenuous activity for 4 weeks following surgery to prevent hernias at your incision sites or recurrence of your hernia. It is normal to feel tired. You may need more sleep than usual.  Get your rest but make sure to get up and move around frequently to prevent blood clots and pneumonia.  Work and Return to School You can go back to work when you feel well enough. Discuss the timing with your surgeon. You can usually go back to school or work 1 week or less after an laparoscopic or an open repair. If your work requires heavy lifting or strenuous activity you need to be placed on light duty for 4 weeks following surgery. You can return to gym class, sports or other physical activities 4 weeks after surgery.  Wound Care You may experience significant bruising throughout the abdominal wall that may track down into the groin including into the scrotum in males.  Rest, elevating the groin and scrotum above the level of the heart, ice and compression with tight fitting underwear or an abdominal binder can help.   Always wash your hands before and after touching near your incision site. Do not soak in a bathtub until cleared at your follow up appointment. You may take a shower 24 hours after surgery. A small amount of drainage from the incision is normal. If the drainage is thick and yellow or the site is red, you may have an infection, so call your surgeon. If you have a drain in one of your incisions, it will be taken out in office when the drainage stops. Steri-Strips will fall off in 7 to 10 days or they will be removed during your first office visit. If you have dermabond glue covering over the incision, allow the glue to flake off on its own. Protect the new skin, especially from the sun. The sun can burn and cause darker scarring. Your scar will heal in about 4 to 6 weeks and will become softer and continue to fade over the next year.  The cosmetic appearance of the incisions will improve over the course of the first year after surgery. Sensation around your incision will return in a few weeks or months.  Bowel Movements After intestinal surgery, you may have loose watery stools for several days. If watery diarrhea lasts longer than 3 days, contact your surgeon. Pain medication (narcotics) can cause constipation. Increase the fiber in your diet with high-fiber foods if you are constipated. You can take an over the counter stool softener like Colace to avoid constipation.  Additional over the counter medications can also be used   if Colace isn't sufficient (for example, Milk of Magnesia or Miralax).  Pain The amount of pain is different for each person. Some people need only 1 to 3 doses of pain control medication, while others need more. Take alternating doses of tylenol and ibuprofen around the clock for the first five days following surgery.  This will provide a baseline of pain control and help with inflammation.  Take the narcotic pain medication in addition if needed for severe pain.  Contact  Your Surgeon at [phone], if you have: Pain that will not go away Pain that gets worse A fever of more than 101F (38.3C) Repeated vomiting Swelling, redness, bleeding, or bad-smelling drainage from your wound site Strong abdominal pain No bowel movement or unable to pass gas for 3 days Watery diarrhea lasting longer than 3 days  Pain Control The goal of pain control is to minimize pain, keep you moving and help you heal. Your surgical team will work with you on your pain plan. Most often a combination of therapies and medications are used to control your pain. You may also be given medication (local anesthetic) at the surgical site. This may help control your pain for several days. Extreme pain puts extra stress on your body at a time when your body needs to focus on healing. Do not wait until your pain has reached a level "10" or is unbearable before telling your doctor or nurse. It is much easier to control pain before it becomes severe. Following a laparoscopic procedure, pain is sometimes felt in the shoulder. This is due to the gas inserted into your abdomen during the procedure. Moving and walking helps to decrease the gas and the right shoulder pain.  Use the guide below for ways to manage your post-operative pain. Learn more by going to facs.org/safepaincontrol.  How Intense Is My Pain Common Therapies to Feel Better       I hardly notice my pain, and it does not interfere with my activities.  I notice my pain and it distracts me, but I can still do activities (sitting up, walking, standing).  Non-Medication Therapies  Ice (in a bag, applied over clothing at the surgical site), elevation, rest, meditation, massage, distraction (music, TV, play) walking and mild exercise Splinting the abdomen with pillows +  Non-Opioid Medications Acetaminophen (Tylenol) Non-steroidal anti-inflammatory drugs (NSAIDS) Aspirin, Ibuprofen (Motrin, Advil) Naproxen (Aleve) Take these as  needed, when you feel pain. Both acetaminophen and NSAIDs help to decrease pain and swelling (inflammation).      My pain is hard to ignore and is more noticeable even when I rest.  My pain interferes with my usual activities.  Non-Medication Therapies  +  Non-Opioid medications  Take on a regular schedule (around-the-clock) instead of as needed. (For example, Tylenol every 6 hours at 9:00 am, 3:00 pm, 9:00 pm, 3:00 am and Motrin every 6 hours at 12:00 am, 6:00 am, 12:00 pm, 6:00 pm)         I am focused on my pain, and I am not doing my daily activities.  I am groaning in pain, and I cannot sleep. I am unable to do anything.  My pain is as bad as it could be, and nothing else matters.  Non-Medication Therapies  +  Around-the-Clock Non-Opioid Medications  +  Short-acting opioids  Opioids should be used with other medications to manage severe pain. Opioids block pain and give a feeling of euphoria (feel high). Addiction, a serious side effect of opioids, is   rare with short-term (a few days) use.  Examples of short-acting opioids include: Tramadol (Ultram), Hydrocodone (Norco, Vicodin), Hydromorphone (Dilaudid), Oxycodone (Oxycontin)     The above directions have been adapted from the American College of [employer] Surgical Patient Education Program.  Please refer to the ACS website if needed: https://www.facs.org/-/media/files/education/[email or web address].ashx   Landynn Dupler, MD [facility] [address], [address], [place], [place]  [address] ?  [address] [address], [place], [place]   [address] [phone] ? [phone] ? FAX [phone] Web site: [email or web address]  

## 2022-01-21 NOTE — Anesthesia Procedure Notes (Signed)
Procedure Name: Intubation ?Date/Time: 01/21/2022 2:30 PM ?Performed by: Milford Cage, CRNA ?Pre-anesthesia Checklist: Patient identified, Emergency Drugs available, Suction available and Patient being monitored ?Patient Re-evaluated:Patient Re-evaluated prior to induction ?Oxygen Delivery Method: Circle system utilized ?Preoxygenation: Pre-oxygenation with 100% oxygen ?Induction Type: IV induction ?Ventilation: Mask ventilation without difficulty ?Laryngoscope Size: 4 ?Grade View: Grade II ?Tube type: Oral ?Tube size: 7.0 mm ?Number of attempts: 2 ?Airway Equipment and Method: Stylet ?Placement Confirmation: ETT inserted through vocal cords under direct vision, positive ETCO2 and breath sounds checked- equal and bilateral ?Secured at: 22 cm ?Tube secured with: Tape ?Dental Injury: Teeth and Oropharynx as per pre-operative assessment  ?Difficulty Due To: Difficulty was unanticipated, Difficult Airway- due to large tongue and Difficult Airway- due to anterior larynx ?Future Recommendations: Recommend- induction with short-acting agent, and alternative techniques readily available ?Comments: Grade 2b view with Mil 2. Glidescope successful  ? ? ? ? ?

## 2022-01-21 NOTE — Discharge Summary (Signed)
Patient ID: [name] [id number] 66 y.o. 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   CKD (chronic kidney disease) stage 2, GFR 60-89 ml/min 02/23/2017   Sepsis ([place]) 08/06/2016   Diverticulitis 08/06/2016   Diverticulitis of intestine without perforation or abscess without bleeding    Hyperlipidemia associated with type 2 diabetes mellitus ([place]) 07/19/2016   S/p nephrectomy 05/30/2016   IBS (irritable bowel syndrome) 05/30/2016   History of renal carcinoma 05/30/2016   Morbid obesity with BMI of 40.0-44.9, adult ([place]) 05/30/2016   Neuralgia of chest 12/30/2015   Atypical chest pain 12/01/2015   Physical exam 10/26/2015   Type 2 diabetes mellitus with stage 3 chronic kidney disease, without long-term current use of insulin ([place]) 07/28/2015   Maxillary sinusitis, acute 01/21/2015   Strain of left pectoralis muscle 09/26/2014   Benign paroxysmal positional vertigo 10/03/2013   Nausea with vomiting 10/03/2013   Bleeding nose 37/85/8850   Helicobacter pylori gastritis 09/13/2013   Pyuria 08/20/2013   Abnormal abdominal CT scan 08/20/2013   Abdominal cramping 08/16/2013   Back pain 07/31/2013   LUQ pain 07/31/2013   Laceration of hand 07/10/2013   Hidradenitis suppurativa of right axilla 05/31/2013   Renal cell carcinoma    Diverticulosis of colon with hemorrhage - recurrent 09/19/2011    Class: Acute    Diverticulosis of large intestine without perforation or abscess with bleeding 09/19/2011   OBESITY, MORBID 09/16/2009   Hypertension associated with diabetes ([place]) 06/23/2009   History of colonic polyps 12/12/2008     Discharge Diagnoses: Incisional hernia Patient Active Problem List   Diagnosis Date Noted   Incisional hernia 01/21/2022   Anemia, normocytic normochromic 08/18/2021   CVA (cerebral vascular accident) ([place]) 05/19/2021   Trigger finger of right thumb 07/29/2020   Gastritis and gastroduodenitis 01/31/2020   Hiatal hernia 01/31/2020   AKI (acute kidney injury) ([place]) 10/20/2019   Hyperlipidemia    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   CKD (chronic kidney disease) stage 2, GFR 60-89 ml/min 02/23/2017   Sepsis ([place]) 08/06/2016   Diverticulitis 08/06/2016   Diverticulitis of intestine without perforation or abscess without bleeding    Hyperlipidemia associated with type 2 diabetes mellitus ([place]) 07/19/2016   S/p nephrectomy 05/30/2016   IBS (irritable bowel syndrome) 05/30/2016   History of renal carcinoma 05/30/2016   Morbid obesity with BMI of 40.0-44.9, adult ([place]) 05/30/2016   Neuralgia of chest 12/30/2015   Atypical chest pain 12/01/2015   Physical exam 10/26/2015   Type 2 diabetes mellitus with stage 3 chronic kidney disease, without long-term current use of insulin ([place]) 07/28/2015   Maxillary sinusitis, acute 01/21/2015   Strain of left pectoralis muscle 09/26/2014   Benign paroxysmal positional vertigo 10/03/2013   Nausea with vomiting 10/03/2013   Bleeding nose 27/74/1287   Helicobacter pylori gastritis 09/13/2013   Pyuria 08/20/2013   Abnormal abdominal CT scan 08/20/2013   Abdominal cramping 08/16/2013   Back pain 07/31/2013   LUQ pain 07/31/2013   Laceration of hand 07/10/2013  Hidradenitis suppurativa of right axilla 05/31/2013   Renal cell carcinoma    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 Snuffer presented with an incisional hernia.  She underwent robotic repair and was discharged after recovering in the hospital.  Consults: None  Significant Diagnostic Studies: None  Treatments: surgery: As above  Disposition: Home  Patient Instructions:  Allergies as of 01/21/2022   No Known Allergies      Medication List     STOP taking these medications    atorvastatin 80 MG tablet Commonly known as: LIPITOR   Hyoscyamine Sulfate SL 0.125 MG Subl Commonly known as: Levsin/SL   Maalox Max 400-400-40 MG/5ML suspension Generic drug: alum & mag hydroxide-simeth   senna-docusate 8.6-50 MG tablet Commonly known as: Senokot-S       TAKE these medications    albuterol 108 (90 Base) MCG/ACT inhaler Commonly known as: VENTOLIN HFA Inhale 2 puffs into the lungs every 6 (six) hours as needed for wheezing.   amLODipine 10 MG tablet Commonly known as: NORVASC Take 10 mg by mouth daily.   azelastine 0.1 % nasal spray Commonly known as: ASTELIN Place 1 spray into both nostrils 2 (two) times daily as needed for rhinitis or allergies. Use in each nostril as directed   Bydureon 2 MG Pen Generic drug: Exenatide ER Inject 2 mg into the skin every Saturday.   clopidogrel 75 MG tablet Commonly known as: PLAVIX Take 1 tablet (75 mg total) by mouth daily.   lisinopril-hydrochlorothiazide 20-12.5 MG tablet Commonly known as: ZESTORETIC Take 2 tablets by mouth daily.   ondansetron 4 MG disintegrating  tablet Commonly known as: Zofran ODT Take 1 tablet (4 mg total) by mouth every 8 (eight) hours as needed.   oxyCODONE-acetaminophen 5-325 MG tablet Commonly known as: Percocet Take 1 tablet by mouth every 4 (four) hours as needed for severe pain.        Activity: no heavy lifting for 4 weeks Diet: regular diet Wound Care: keep wound clean and dry  Follow-up:  With Dr. Thermon Leyland in 4 weeks.  Signed: Rosario Adie, MD  Colorectal and General Surgery [facility]    01/21/2022, 5:02 PM

## 2022-01-21 NOTE — H&P (Signed)
? ?Admitting Physician: Nickola Major Xcaret Morad ? ?Service: General surgery ? ?CC: Hernia ? ?Subjective  ? ?HPI: ?[name] is an 66 y.o. female who is here for robotic hernia repair ? ?Past Medical History:  ?Diagnosis Date  ? Anemia   ? Blood transfusion 2010  ? associated with GI bleeding  ? CKD (chronic kidney disease) stage 2, GFR 60-89 ml/min   ? Depression   ? Diabetes mellitus   ? Diverticulosis of colon with hemorrhage 2010, 09/2011  ? H/O hiatal hernia   ? Helicobacter pylori gastritis 09/13/2013  ? Hx: UTI (urinary tract infection)   ? Hypertension   ? Personal history of colonic polyps   ? adenomas since 2010  ? Renal cell carcinoma 2004  ? lt kidney removed  ? Stroke [facility]) 12/2017  ? 05/2021  ? ? ?Past Surgical History:  ?Procedure Laterality Date  ? ABDOMINAL HYSTERECTOMY  2004  ? BIOPSY  10/23/2019  ? Procedure: BIOPSY;  Surgeon: Lavena Bullion, DO;  Location: [facility];  Service: Gastroenterology;;  ? CHOLECYSTECTOMY    ? COLONOSCOPY  12/19/2008  ? diverticulosis  ? COLONOSCOPY  11/17/2011  ? Procedure: COLONOSCOPY;  Surgeon: Zenovia Jarred, MD;  Location: [facility];  Service: Gastroenterology;  Laterality: N/A;  ? DILATION AND CURETTAGE OF UTERUS N/A 07/11/2014  ? Procedure: Repair of Vaginal Cuff;  Surgeon: Melina Schools, MD;  Location: [facility];  Service: Gynecology;  Laterality: N/A;  ? ESOPHAGOGASTRODUODENOSCOPY (EGD) WITH PROPOFOL N/A 10/23/2019  ? Procedure: ESOPHAGOGASTRODUODENOSCOPY (EGD) WITH PROPOFOL;  Surgeon: Lavena Bullion, DO;  Location: [facility];  Service: Gastroenterology;  Laterality: N/A;  ? FLEXIBLE SIGMOIDOSCOPY  12/12/2008  ? diverticulosis  ? HAND SURGERY    ? x5 right  ? HYDRADENITIS EXCISION Right 03/21/2014  ? Procedure: EXCISION HIDRADENITIS AXILLA;  Surgeon: Pedro Earls, MD;  Location: [facility];  Service: General;  Laterality: Right;  ? KNEE ARTHROSCOPY    ? x2 left  ? NEPHRECTOMY  2013  ? left  ? TRIGGER FINGER RELEASE Left  06/23/2015  ? Procedure: RELEASE TRIGGER FINGER/A-1 PULLEY LEFT RING FINGER;  Surgeon: Daryll Brod, MD;  Location: [facility];  Service: Orthopedics;  Laterality: Left;  ? TUBAL LIGATION  1980  ? UPPER GASTROINTESTINAL ENDOSCOPY  12/12/2008  ? gastroporesis  ? ? ?Family History  ?Problem Relation Age of Onset  ? Colon polyps Mother   ? Diabetes Maternal Grandmother   ? Breast cancer Maternal Grandmother   ? Diabetes Father   ? Breast cancer Daughter   ? Colon cancer Neg Hx   ? ? ?Social:  reports that she has never smoked. She has never used smokeless tobacco. She reports that she does not drink alcohol and does not use drugs. ? ?Allergies: No Known Allergies ? ?Medications: ?Current Outpatient Medications  ?Medication Instructions  ? albuterol (VENTOLIN HFA) 108 (90 Base) MCG/ACT inhaler 2 puffs, Inhalation, Every 6 hours PRN  ? alum & mag hydroxide-simeth (MAALOX MAX) 400-400-40 MG/5ML suspension 10 mLs, Oral, Every 6 hours PRN  ? amLODipine (NORVASC) 10 mg, Oral, Daily  ? atorvastatin (LIPITOR) 80 mg, Oral, Daily  ? azelastine (ASTELIN) 0.1 % nasal spray 1 spray, Each Nare, 2 times daily PRN, Use in each nostril as directed  ? Bydureon 2 mg, Subcutaneous, Every Sat  ? clopidogrel (PLAVIX) 75 mg, Oral, Daily  ? Hyoscyamine Sulfate SL (LEVSIN/SL) 0.125 mg, Sublingual, 2 times daily PRN  ? lisinopril-hydrochlorothiazide (ZESTORETIC) 20-12.5 MG tablet 2 tablets, Oral, Daily  ?  ondansetron (ZOFRAN ODT) 4 mg, Oral, Every 8 hours PRN  ? senna-docusate (SENOKOT-S) 8.6-50 MG tablet 1 tablet, Oral, At bedtime PRN  ? ? ?ROS - all of the below systems have been reviewed with the patient and positives are indicated with bold text ?General: chills, fever or night sweats ?Eyes: blurry vision or double vision ?ENT: epistaxis or sore throat ?Allergy/Immunology: itchy/watery eyes or nasal congestion ?Hematologic/Lymphatic: bleeding problems, blood clots or swollen lymph nodes ?Endocrine: temperature intolerance or  unexpected weight changes ?Breast: new or changing breast lumps or nipple discharge ?Resp: cough, shortness of breath, or wheezing ?CV: chest pain or dyspnea on exertion ?GI: as per HPI ?GU: dysuria, trouble voiding, or hematuria ?MSK: joint pain or joint stiffness ?Neuro: TIA or stroke symptoms ?Derm: pruritus and skin lesion changes ?Psych: anxiety and depression ? ?Objective  ? ?PE ?Last menstrual period 11/16/2002. ?Constitutional: NAD; conversant; no deformities ?Eyes: Moist conjunctiva; no lid lag; anicteric; PERRL ?Neck: Trachea midline; no thyromegaly ?Lungs: Normal respiratory effort; no tactile fremitus ?CV: RRR; no palpable thrills; no pitting edema ?GI: Abd Lower midline incision with palpable hernia; no palpable hepatosplenomegaly ?MSK: Normal range of motion of extremities; no clubbing/cyanosis ?Psychiatric: Appropriate affect; alert and oriented x3 ?Lymphatic: No palpable cervical or axillary lymphadenopathy ? ?No results found for this or any previous visit (from the past 24 hour(s)). ? ?Imaging Orders  ?No imaging studies ordered today  ? ?CT Abd/Pel 09/12/21 ? ?1. Right periumbilical hernia containing a loop of small bowel with ?mild surrounding inflammatory changes. No resulting bowel ?obstruction. ?2. Prior right nephrectomy. ?3. Diverticulosis without evidence of diverticulitis. ? ?~2cm wide by 8.4 cm tall area of multiple hernia defects noted on CT ? ? ? ?Assessment and Plan  ? ?Assessment and Plan:  ?Diagnoses and all orders for this visit: ? ?Incisional hernia, without obstruction or gangrene ? ?Morbid obesity with body mass index (BMI) of 40.0 to 44.9 in adult (CMS-HCC) ? ? ? ?[name] has an approximately 2 cm wide by 8.4 cm tall incisional hernia from previous midline laparotomy for hysterectomy. I recommended robotic incisional hernia repair with mesh. The procedure itself as well as its risk, benefits, and alternatives were discussed with the patient in full who granted consent to  proceed. We will proceed as scheduled. ? ? ?Felicie Morn, MD ? ?[facility] ?Use [email or web address] to contact on call provider ? ? ? ?

## 2022-01-21 NOTE — Anesthesia Preprocedure Evaluation (Signed)
Anesthesia Evaluation  ?Patient identified by MRN, date of birth, ID band ?Patient awake ? ? ? ?Reviewed: ?Allergy & Precautions, NPO status , Patient's Chart, lab work & pertinent test results ? ?Airway ?Mallampati: II ? ?TM Distance: >3 FB ?Neck ROM: Full ? ? ? Dental ? ?(+) Dental Advisory Given ?  ?Pulmonary ?neg pulmonary ROS,  ?  ?breath sounds clear to auscultation ? ? ? ? ? ? Cardiovascular ?hypertension, Pt. on medications ? ?Rhythm:Regular Rate:Normal ? ? ?  ?Neuro/Psych ?CVA   ? GI/Hepatic ?Neg liver ROS, hiatal hernia,   ?Endo/Other  ?diabetes, Type 2Morbid obesity ? Renal/GU ?CRFRenal disease  ? ?  ?Musculoskeletal ? ? Abdominal ?  ?Peds ? Hematology ?negative hematology ROS ?(+)   ?Anesthesia Other Findings ? ? Reproductive/Obstetrics ? ?  ? ? ? ? ? ? ? ? ? ? ? ? ? ?  ?  ? ? ? ? ? ? ? ? ?Anesthesia Physical ?Anesthesia Plan ? ?ASA: 3 ? ?Anesthesia Plan: General  ? ?Post-op Pain Management: Gabapentin PO (pre-op)* and Tylenol PO (pre-op)*  ? ?Induction: Intravenous ? ?PONV Risk Score and Plan: 4 or greater and Dexamethasone, Ondansetron, Midazolam, Treatment may vary due to age or medical condition and Scopolamine patch - Pre-op ? ?Airway Management Planned: Oral ETT ? ?Additional Equipment: None ? ?Intra-op Plan:  ? ?Post-operative Plan: Extubation in OR ? ?Informed Consent: I have reviewed the patients History and Physical, chart, labs and discussed the procedure including the risks, benefits and alternatives for the proposed anesthesia with the patient or authorized representative who has indicated his/her understanding and acceptance.  ? ? ? ?Dental advisory given ? ?Plan Discussed with: CRNA ? ?Anesthesia Plan Comments:   ? ? ? ? ? ? ?Anesthesia Quick Evaluation ? ?

## 2022-01-21 NOTE — Op Note (Signed)
Patient: [name] ([birth year], [id number])  Date of Surgery: 01/21/2022   Preoperative Diagnosis: INCISIONAL HERNIA   Postoperative Diagnosis: INCISIONAL HERNIA   Surgical Procedure:  ROBOTIC INCISIONAL HERNIA REPAIR WITH MESH,  BILATERAL POSTERIOR RECTUS MYOFASCIAL RELEASE:    Operative Team Members:  Surgeon(s) and Role:    * Katalin Colledge, Nickola Major, MD - Primary   Anesthesiologist: Suzette Battiest, MD; Freddrick March, MD CRNA: Milford Cage, CRNA   Anesthesia: General   Fluids:  Total I/O In: 100 [IV [id number]] Out: -   Complications: None  Drains:  None  Specimen: None  Disposition:  PACU - hemodynamically stable.  Plan of Care: Admit for overnight observation  Indications for Procedure: [name] is a 66 y.o. female who presented with an incisional hernia.  I recommended robotic ventral hernia repair with mesh.  The procedure itself as well as the risks, benefits and alternatives were described.  The risks discussed included but were not limited to the risk of infection, bleeding, damage to nearby structures, recurrent hernia, chronic pain, and mesh complication requiring removal.  After a full discussion and all questions answered, the patient granted consent to proceed.  Findings:  Hernia Location: Ventral hernia location: Umbilical (M3), Infraumbilical (M4), and Suprapubic (M5) Hernia Size:  12 cm tall x 6 cm wide  Mesh Size &Type:  30 cm tall x 21 cm wide  Bard Soft Mesh Mesh Position: [place]ubl[place] - Retromuscular Myofascial Releases: Bilateral Myofascial Release: Posterior rectus myofascial release   Description of Procedure: The patient was positioned supine, moderately flexed at the umbilical level, padded and secured on the operating table.  A timeout procedure was performed.    What is described is a robotic, totally extraperitoneal retromuscular incisional hernia repair with bilateral rectus myofascial release and retromuscular  mesh placement.  Laparoscopic Portion: The retrorectus space was entered in the LEFT hypochondrium, at approximately the midclavicular line utilizing a 5 mm optical-viewing trocar.  Upon safe entry into this space, it was insufflated while performing a blunt dissection with the camera still in the optical trocar. A rectus myofascial release was performed on the LEFT side. Dissection was carried out laterally in the retromuscular plane to the edge of the rectus sheath progressively disconnecting the rectus muscle from the underlying posterior rectus sheath. Both the segmental innervation as well as the intercostal artery and vein brances to the rectus muscle were individually preserved.    During the left sided retrorectus dissection, a 12 mm trocar was placed into the lateral most edge of the retrorectus space.  With these initial trocars in position, the medial most aspect of the retrorectus plane was identified, and the posterior sheath was visualized as it inserted on the linea alba. The posterior sheath was incised with cautery entering the preperitoneal plane. A crossover was performed dissecting under the linea alba in the preperitoneal plane until the right rectus sheath was identified.  After identification of the right rectus sheath, it was incised vertically to enter the retrorectus space on the right. A rectus myofascial release was performed on the RIGHT side.  Blunt dissection was carried out laterally in the retromuscular plane to the edge of the rectus sheath progressively disconnecting the rectus muscle from the underlying posterior rectus sheath. Both the segmental innervation as well as the intercostal artery and vein brances to the rectus muscle were individually preserved.   At this juncture, both retrorectus planes were initially connected to each other and there was space for further trocar placement. An 8  mm robotic trocar was placed in the midclavicular line in right retrorectus  space.  A 53m robotic trocar was placed within the left rectus musculature in the upper abdomen, and not through the linea alba.  The initial 5 mm access trocar in the midclavicular line within the left retrorectus space was switched out for an 8 mm robotic trocar.   Robotic Portion: The Intuitive daVinci Xi surgical robot was docked in the standard fashion and the procedure begun from the robotic console. A Prograsp instrument and monopolar shears were used for the dissection.  Dissection was carried down inferiorly preserving the peritoneum and the preperitoneal fat in the midline as it was gently dissected off of the overlying linea alba.  On the right side, the posterior rectus sheath was progressively disconnected from its insertion on the linea alba. This allowed for progression of the right side rectus myofascial release.  The rectus myofascial release accomplished medialization of the posterior rectus sheath towards the midline and disinsertion of the rectus muscle from its surrounding fascia, and thus its encasement in the rectus sheath, allowing for widening of the rectus muscle and transfer of the rectus flap towards the midline.  This will allow for future inset of the medial aspect of the flap for abdominal wall reconstruction.  Similarly, on the left side, the posterior rectus sheath was also progressively disconnected from its insertion on the linea alba.  This allowed for progression of the left side rectus myofascial release.  The rectus myofascial release accomplished medialization of the posterior rectus sheath towards the midline and disinsertion of the rectus muscle from its surrounding fascia, and thus its encasement in the rectus sheath, allowing for widening of the rectus muscle and transfer of the rectus flap towards the midline.  This will allow for future inset of the medial aspect of the flap for abdominal wall reconstruction.   During the dissection of the midline the hernia  defect was identified and the hernia sac was not reducible, therefore the hernia peritoneum was incised circumferentially around the edge of the hernia defect which left a defect within the peritoneum in the midline.  This defect was later closed with a running 0 vicryl suture.  Both the left and the right rectus myofascial releases were performed towards the lower abdomen, past the arcuate line bilaterally.  During this dissection, the peritoneum and preperitoneal fat in the midline were further preserved below the hernia as they were dissected off of the overlying linea alba.   The hernia defect area was now visualized fully.  The hernia defects were located in the umbilical, infraumbilical, and suprapubic regions. Utilizing a metric ruler, the defect are was measured intracorporeally to be 6 cm horizontal by 12 cm vertical.  The hernia defect was closed utilizing a continuous, #1 Ethicon Stratafix Symmetric PDS Plus suture.  The hernia defect, and subsequently the rectus musculature, came together well for a complete abdominal wall reconstruction.  The dissected out retrorectus space was measured with a metric ruler so as to determine the size of the proposed mesh.    The robot was undocked and the laparoscope was inserted, inspecting for hemostasis.  The mesh deployment was performed laparoscopically.  Laparoscopic Portion:  A transversus abdominis plane (TAP) block was performed bilaterally with a mixture of marcaine and Exparel.  The anesthetic was first injected into the plane between the transversus abdominis and internal abdominal oblique muscles on the left. The TAP was repeated on the contralateral side.   A piece of  Bard Soft Mesh was opened and trimmed to 30 cm tall x 21 cm wide. The mesh was advanced into the retrorectus space and the mesh positioned flat against the intact posterior rectus sheaths. The mesh was not fixated as it occupied the entire retromuscular plane, and also covered  all of the trocars.  The trocars were removed and the skin closed with 4-0 Monocryl subcuticular sutures and skin glue.   Louanna Raw, MD General, Bariatric, & Minimally Invasive Surgery [facility], [place]

## 2022-01-21 NOTE — Anesthesia Postprocedure Evaluation (Signed)
Anesthesia Post Note ? ?Patient: [name] ? ?Procedure(s) Performed: ROBOTIC INCISIONAL HERNIA REPAIR WITH MESH, BILATERAL POSTERIOR RECTUS MYOFASCIAL RELEASE ? ?  ? ?Patient location during evaluation: PACU ?Anesthesia Type: General ?Level of consciousness: awake and alert ?Pain management: pain level controlled ?Vital Signs Assessment: post-procedure vital signs reviewed and stable ?Respiratory status: spontaneous breathing, nonlabored ventilation, respiratory function stable and patient connected to nasal cannula oxygen ?Cardiovascular status: blood pressure returned to baseline and stable ?Postop Assessment: no apparent nausea or vomiting ?Anesthetic complications: no ? ? ?No notable events documented. ? ?Last Vitals:  ?Vitals:  ? 01/21/22 1745 01/21/22 1800  ?BP: (!) 163/86 (!) 152/80  ?Pulse: 86 85  ?Resp: 13 11  ?Temp:  (!) 36.4 ?C  ?SpO2: 100% 100%  ?  ?Last Pain:  ?Vitals:  ? 01/21/22 1800  ?TempSrc:   ?PainSc: Asleep  ? ? ?  ?  ?  ?  ?  ?  ? ?Jermell Holeman L Curley Hogen ? ? ? ? ?

## 2022-01-21 NOTE — Progress Notes (Signed)
Attempted to ambulate patient to the bathroom but patient is very shaky and could not keep her eyes open, purewick in place so that patient could urinate.  ?

## 2022-01-21 NOTE — Transfer of Care (Signed)
Immediate Anesthesia Transfer of Care Note ? ?Patient: [name] ? ?Procedure(s) Performed: ROBOTIC INCISIONAL HERNIA REPAIR WITH MESH, BILATERAL POSTERIOR RECTUS MYOFASCIAL RELEASE ? ?Patient Location: PACU ? ?Anesthesia Type:General ? ?Level of Consciousness: drowsy ? ?Airway & Oxygen Therapy: Patient Spontanous Breathing and Patient connected to face mask oxygen ? ?Post-op Assessment: Report given to RN and Post -op Vital signs reviewed and stable ? ?Post vital signs: Reviewed and stable ? ?Last Vitals:  ?Vitals Value Taken Time  ?BP 167/81 01/21/22 1705  ?Temp    ?Pulse 89 01/21/22 1707  ?Resp 15 01/21/22 1708  ?SpO2 100 % 01/21/22 1707  ?Vitals shown include unvalidated device data. ? ?Last Pain:  ?Vitals:  ? 01/21/22 1149  ?TempSrc: Oral  ?   ? ?  ? ?Complications: No notable events documented. ?

## 2022-01-22 DIAGNOSIS — K432 Incisional hernia without obstruction or gangrene: Secondary | ICD-10-CM | POA: Diagnosis not present

## 2022-01-22 LAB — GLUCOSE, CAPILLARY
Glucose-Capillary: 137 mg/dL — ABNORMAL HIGH (ref 70–99)
Glucose-Capillary: 145 mg/dL — ABNORMAL HIGH (ref 70–99)
Glucose-Capillary: 154 mg/dL — ABNORMAL HIGH (ref 70–99)
Glucose-Capillary: 119 mg/dL — ABNORMAL HIGH (ref 70–99)

## 2022-01-22 NOTE — Progress Notes (Signed)
1 Day Post-Op ROBOTIC INCISIONAL HERNIA REPAIR WITH MESH, BILATERAL POSTERIOR RECTUS MYOFASCIAL RELEASE ?Subjective: ?Pt states pain reasonably controlled, no nausea, tolerating liquids ? ?Objective: ?Vital signs in last 24 hours: ?Temp:  [96.9 ?F (36.1 ?C)-98.7 ?F (37.1 ?C)] 98.7 ?F (37.1 ?C) (03/11 0509) ?Pulse Rate:  [73-98] 94 (03/11 0509) ?Resp:  [10-18] 16 (03/11 0509) ?BP: (150-197)/(80-100) 150/82 (03/11 0509) ?SpO2:  [96 %-100 %] 98 % (03/11 0509) ?Weight:  [104.3 kg] 104.3 kg (03/10 1816) ? ? ?Intake/Output from previous day: ?03/10 0701 - 03/11 0700 ?In: 7026 [P.O.:210; I.V.:1460; IV Piggyback:100] ?Out: 550 [Urine:500; Blood:50] ?Intake/Output this shift: ?Total I/O ?In: 120 [P.O.:120] ?Out: -  ? ? ?General appearance: alert and cooperative ?GI: soft, obese ? ?Incision: no significant drainage ? ?Lab Results:  ?Recent Labs  ?  01/21/22 ?1855  ?WBC 12.6*  ?HGB 12.1  ?HCT 39.4  ?PLT 233  ? ?BMET ?Recent Labs  ?  01/21/22 ?1855  ?CREATININE 1.49*  ? ?PT/INR ?No results for input(s): LABPROT, INR in the last 72 hours. ?ABG ?No results for input(s): PHART, HCO3 in the last 72 hours. ? ?Invalid input(s): PCO2, PO2 ? ?MEDS, Scheduled ? acetaminophen  650 mg Oral Q6H  ? amLODipine  10 mg Oral Daily  ? docusate sodium  100 mg Oral BID  ? enoxaparin (LOVENOX) injection  40 mg Subcutaneous Q24H  ? Exenatide ER  2 mg Subcutaneous Q Sat  ? gabapentin  300 mg Oral TID  ? lisinopril  40 mg Oral Daily  ? And  ? hydrochlorothiazide  25 mg Oral Daily  ? insulin aspart  0-15 Units Subcutaneous TID WC  ? insulin aspart  0-5 Units Subcutaneous QHS  ? ? ?Studies/Results: ?No results found. ? ?Assessment: ?s/p Procedure(s): ?ROBOTIC INCISIONAL HERNIA REPAIR WITH MESH, BILATERAL POSTERIOR RECTUS MYOFASCIAL RELEASE ?Patient Active Problem List  ? Diagnosis Date Noted  ? Incisional hernia 01/21/2022  ? Anemia, normocytic normochromic 08/18/2021  ? CVA (cerebral vascular accident) ([place]) 05/19/2021  ? Trigger finger of right thumb  07/29/2020  ? Gastritis and gastroduodenitis 01/31/2020  ? Hiatal hernia 01/31/2020  ? AKI (acute kidney injury) ([place]) 10/20/2019  ? Hyperlipidemia   ? 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 CKD (chronic kidney disease) stage 2, GFR 60-89 ml/min 02/23/2017  ? Sepsis ([place]) 08/06/2016  ? Diverticulitis 08/06/2016  ? Diverticulitis of intestine without perforation or abscess without bleeding   ? Hyperlipidemia associated with type 2 diabetes mellitus ([place]) 07/19/2016  ? S/p nephrectomy 05/30/2016  ? IBS (irritable bowel syndrome) 05/30/2016  ? History of renal carcinoma 05/30/2016  ? Morbid obesity with BMI of 40.0-44.9, adult ([place]) 05/30/2016  ? Neuralgia of chest 12/30/2015  ? Atypical chest pain 12/01/2015  ? Physical exam 10/26/2015  ? Type 2 diabetes mellitus with stage 3 chronic kidney disease, without long-term current use of insulin ([place]) 07/28/2015  ? Maxillary sinusitis, acute 01/21/2015  ? Strain of left pectoralis muscle 09/26/2014  ? Benign paroxysmal positional vertigo 10/03/2013  ? Nausea with vomiting 10/03/2013  ? Bleeding nose 10/03/2013  ? Helicobacter pylori gastritis 09/13/2013  ? Pyuria 08/20/2013  ? Abnormal abdominal CT scan 08/20/2013  ? Abdominal cramping 08/16/2013  ? Back pain 07/31/2013  ? LUQ pain 07/31/2013  ? Laceration of hand 07/10/2013  ? Hidradenitis suppurativa of right axilla 05/31/2013  ? Renal cell carcinoma   ? Diverticulosis of colon with hemorrhage - recurrent 09/19/2011  ?  Class: Acute  ? Diverticulosis of large intestine without perforation or abscess with bleeding  09/19/2011  ? OBESITY, MORBID 09/16/2009  ? Hypertension associated with diabetes ([place]) 06/23/2009  ? History of colonic polyps 12/12/2008  ? ? ?Expected post op course ? ?Plan: ?Advance diet ?Ambulate ?Ok for d/c later this afternoon if pt ambulating well and pain controlled ? ? LOS: 0 days  ? ? ? ?Marland KitchenRosario Adie, MD ?[facility], [place] ? ? ? ?01/22/2022 ?8:43 AM ? ? ? ?  ?

## 2022-01-22 NOTE — Progress Notes (Signed)
Pt complains of nausea after lunch tray, refuses prn med, no emesis noted. Up walking halls and passing gas. Will cont to monitor. ?

## 2022-01-23 DIAGNOSIS — K432 Incisional hernia without obstruction or gangrene: Secondary | ICD-10-CM | POA: Diagnosis not present

## 2022-01-23 LAB — GLUCOSE, CAPILLARY
Glucose-Capillary: 110 mg/dL — ABNORMAL HIGH (ref 70–99)
Glucose-Capillary: 118 mg/dL — ABNORMAL HIGH (ref 70–99)

## 2022-01-23 NOTE — Progress Notes (Signed)
Reviewed written d/c instructions w pt and all questions answered. She verbalized understanding. D/C via w/c w all belongings in stable condition. 

## 2022-01-24 ENCOUNTER — Encounter (HOSPITAL_COMMUNITY): Payer: Self-pay | Admitting: Surgery

## 2023-01-27 ENCOUNTER — Inpatient Hospital Stay (HOSPITAL_COMMUNITY)
Admission: EM | Admit: 2023-01-27 | Discharge: 2023-01-31 | DRG: 065 | Disposition: A | Discharge status: Rehabilitation Facility | Payer: Medicare Other | Attending: Neurology | Admitting: Neurology

## 2023-01-27 ENCOUNTER — Emergency Department (HOSPITAL_COMMUNITY): Payer: Medicare Other

## 2023-01-27 ENCOUNTER — Encounter (HOSPITAL_COMMUNITY): Payer: Self-pay

## 2023-01-27 ENCOUNTER — Other Ambulatory Visit: Payer: Self-pay

## 2023-01-27 DIAGNOSIS — Z833 Family history of diabetes mellitus: Secondary | ICD-10-CM

## 2023-01-27 DIAGNOSIS — I69354 Hemiplegia and hemiparesis following cerebral infarction affecting left non-dominant side: Secondary | ICD-10-CM

## 2023-01-27 DIAGNOSIS — I10 Essential (primary) hypertension: Secondary | ICD-10-CM | POA: Diagnosis not present

## 2023-01-27 DIAGNOSIS — Z8679 Personal history of other diseases of the circulatory system: Secondary | ICD-10-CM | POA: Diagnosis not present

## 2023-01-27 DIAGNOSIS — Z79899 Other long term (current) drug therapy: Secondary | ICD-10-CM

## 2023-01-27 DIAGNOSIS — R29703 NIHSS score 3: Secondary | ICD-10-CM | POA: Diagnosis present

## 2023-01-27 DIAGNOSIS — N179 Acute kidney failure, unspecified: Secondary | ICD-10-CM | POA: Diagnosis present

## 2023-01-27 DIAGNOSIS — N1832 Chronic kidney disease, stage 3b: Secondary | ICD-10-CM | POA: Diagnosis present

## 2023-01-27 DIAGNOSIS — I671 Cerebral aneurysm, nonruptured: Secondary | ICD-10-CM | POA: Diagnosis present

## 2023-01-27 DIAGNOSIS — I130 Hypertensive heart and chronic kidney disease with heart failure and stage 1 through stage 4 chronic kidney disease, or unspecified chronic kidney disease: Secondary | ICD-10-CM | POA: Diagnosis present

## 2023-01-27 DIAGNOSIS — E1122 Type 2 diabetes mellitus with diabetic chronic kidney disease: Secondary | ICD-10-CM | POA: Diagnosis present

## 2023-01-27 DIAGNOSIS — R52 Pain, unspecified: Secondary | ICD-10-CM | POA: Diagnosis not present

## 2023-01-27 DIAGNOSIS — Z9049 Acquired absence of other specified parts of digestive tract: Secondary | ICD-10-CM

## 2023-01-27 DIAGNOSIS — Z85528 Personal history of other malignant neoplasm of kidney: Secondary | ICD-10-CM | POA: Diagnosis not present

## 2023-01-27 DIAGNOSIS — M25572 Pain in left ankle and joints of left foot: Secondary | ICD-10-CM | POA: Diagnosis not present

## 2023-01-27 DIAGNOSIS — I708 Atherosclerosis of other arteries: Secondary | ICD-10-CM | POA: Diagnosis present

## 2023-01-27 DIAGNOSIS — Z6841 Body Mass Index (BMI) 40.0 and over, adult: Secondary | ICD-10-CM | POA: Diagnosis not present

## 2023-01-27 DIAGNOSIS — M109 Gout, unspecified: Secondary | ICD-10-CM | POA: Diagnosis present

## 2023-01-27 DIAGNOSIS — E1151 Type 2 diabetes mellitus with diabetic peripheral angiopathy without gangrene: Secondary | ICD-10-CM | POA: Diagnosis present

## 2023-01-27 DIAGNOSIS — M10072 Idiopathic gout, left ankle and foot: Secondary | ICD-10-CM | POA: Diagnosis not present

## 2023-01-27 DIAGNOSIS — I619 Nontraumatic intracerebral hemorrhage, unspecified: Principal | ICD-10-CM

## 2023-01-27 DIAGNOSIS — D631 Anemia in chronic kidney disease: Secondary | ICD-10-CM | POA: Diagnosis present

## 2023-01-27 DIAGNOSIS — E1169 Type 2 diabetes mellitus with other specified complication: Secondary | ICD-10-CM | POA: Diagnosis not present

## 2023-01-27 DIAGNOSIS — I610 Nontraumatic intracerebral hemorrhage in hemisphere, subcortical: Principal | ICD-10-CM

## 2023-01-27 DIAGNOSIS — R270 Ataxia, unspecified: Secondary | ICD-10-CM | POA: Diagnosis present

## 2023-01-27 DIAGNOSIS — Z83719 Family history of colon polyps, unspecified: Secondary | ICD-10-CM

## 2023-01-27 DIAGNOSIS — I6389 Other cerebral infarction: Secondary | ICD-10-CM | POA: Diagnosis not present

## 2023-01-27 DIAGNOSIS — I358 Other nonrheumatic aortic valve disorders: Secondary | ICD-10-CM | POA: Diagnosis present

## 2023-01-27 DIAGNOSIS — I2510 Atherosclerotic heart disease of native coronary artery without angina pectoris: Secondary | ICD-10-CM | POA: Diagnosis present

## 2023-01-27 DIAGNOSIS — E785 Hyperlipidemia, unspecified: Secondary | ICD-10-CM | POA: Diagnosis present

## 2023-01-27 DIAGNOSIS — Z905 Acquired absence of kidney: Secondary | ICD-10-CM | POA: Diagnosis not present

## 2023-01-27 DIAGNOSIS — K219 Gastro-esophageal reflux disease without esophagitis: Secondary | ICD-10-CM | POA: Diagnosis present

## 2023-01-27 DIAGNOSIS — Z803 Family history of malignant neoplasm of breast: Secondary | ICD-10-CM

## 2023-01-27 DIAGNOSIS — R200 Anesthesia of skin: Secondary | ICD-10-CM | POA: Diagnosis not present

## 2023-01-27 DIAGNOSIS — Z90710 Acquired absence of both cervix and uterus: Secondary | ICD-10-CM

## 2023-01-27 DIAGNOSIS — K589 Irritable bowel syndrome without diarrhea: Secondary | ICD-10-CM | POA: Diagnosis present

## 2023-01-27 DIAGNOSIS — H532 Diplopia: Secondary | ICD-10-CM | POA: Diagnosis present

## 2023-01-27 DIAGNOSIS — R110 Nausea: Secondary | ICD-10-CM | POA: Diagnosis not present

## 2023-01-27 DIAGNOSIS — I161 Hypertensive emergency: Secondary | ICD-10-CM | POA: Diagnosis present

## 2023-01-27 DIAGNOSIS — E669 Obesity, unspecified: Secondary | ICD-10-CM | POA: Diagnosis not present

## 2023-01-27 DIAGNOSIS — Z86010 Personal history of colonic polyps: Secondary | ICD-10-CM

## 2023-01-27 DIAGNOSIS — N1831 Chronic kidney disease, stage 3a: Secondary | ICD-10-CM | POA: Diagnosis not present

## 2023-01-27 DIAGNOSIS — Z8673 Personal history of transient ischemic attack (TIA), and cerebral infarction without residual deficits: Secondary | ICD-10-CM | POA: Diagnosis not present

## 2023-01-27 DIAGNOSIS — R4182 Altered mental status, unspecified: Secondary | ICD-10-CM | POA: Diagnosis not present

## 2023-01-27 DIAGNOSIS — E1165 Type 2 diabetes mellitus with hyperglycemia: Secondary | ICD-10-CM | POA: Diagnosis present

## 2023-01-27 DIAGNOSIS — H538 Other visual disturbances: Secondary | ICD-10-CM | POA: Diagnosis present

## 2023-01-27 DIAGNOSIS — F40240 Claustrophobia: Secondary | ICD-10-CM | POA: Diagnosis present

## 2023-01-27 DIAGNOSIS — Z87440 Personal history of urinary (tract) infections: Secondary | ICD-10-CM

## 2023-01-27 DIAGNOSIS — M10372 Gout due to renal impairment, left ankle and foot: Secondary | ICD-10-CM | POA: Diagnosis not present

## 2023-01-27 DIAGNOSIS — I63312 Cerebral infarction due to thrombosis of left middle cerebral artery: Secondary | ICD-10-CM | POA: Diagnosis not present

## 2023-01-27 DIAGNOSIS — N182 Chronic kidney disease, stage 2 (mild): Secondary | ICD-10-CM | POA: Diagnosis not present

## 2023-01-27 HISTORY — DX: Type 2 diabetes mellitus without complications: E11.9

## 2023-01-27 HISTORY — DX: Other specified disorders of veins: I87.8

## 2023-01-27 HISTORY — DX: Morbid (severe) obesity due to excess calories: E66.01

## 2023-01-27 HISTORY — DX: Peripheral vascular disease, unspecified: I73.9

## 2023-01-27 HISTORY — DX: Nontraumatic intracerebral hemorrhage, unspecified: I61.9

## 2023-01-27 HISTORY — DX: Atherosclerotic heart disease of native coronary artery without angina pectoris: I25.10

## 2023-01-27 HISTORY — DX: Heart failure, unspecified: I50.9

## 2023-01-27 LAB — GLUCOSE, CAPILLARY: Glucose-Capillary: 176 mg/dL — ABNORMAL HIGH (ref 70–99)

## 2023-01-27 LAB — COMPREHENSIVE METABOLIC PANEL
ALT: 17 U/L (ref 0–44)
AST: 22 U/L (ref 15–41)
Albumin: 2.5 g/dL — ABNORMAL LOW (ref 3.5–5.0)
Alkaline Phosphatase: 107 U/L (ref 38–126)
Anion gap: 10 (ref 5–15)
BUN: 23 mg/dL (ref 8–23)
CO2: 24 mmol/L (ref 22–32)
Calcium: 8.4 mg/dL — ABNORMAL LOW (ref 8.9–10.3)
Chloride: 104 mmol/L (ref 98–111)
Creatinine, Ser: 1.72 mg/dL — ABNORMAL HIGH (ref 0.44–1.00)
GFR, Estimated: 33 mL/min — ABNORMAL LOW (ref >60)
Glucose, Bld: 187 mg/dL — ABNORMAL HIGH (ref 70–99)
Potassium: 4.1 mmol/L (ref 3.5–5.1)
Sodium: 138 mmol/L (ref 135–145)
Total Bilirubin: 0.2 mg/dL — ABNORMAL LOW (ref 0.3–1.2)
Total Protein: 6.5 g/dL (ref 6.5–8.1)

## 2023-01-27 LAB — CBC WITH DIFFERENTIAL/PLATELET
Abs Immature Granulocytes: 0.02 10*3/uL (ref 0.00–0.07)
Basophils Absolute: 0 10*3/uL (ref 0.0–0.1)
Basophils Relative: 0 %
Eosinophils Absolute: 0.2 10*3/uL (ref 0.0–0.5)
Eosinophils Relative: 2 %
HCT: 43.7 % (ref 36.0–46.0)
Hemoglobin: 13.7 g/dL (ref 12.0–15.0)
Immature Granulocytes: 0 %
Lymphocytes Relative: 38 %
Lymphs Abs: 2.9 10*3/uL (ref 0.7–4.0)
MCH: 28.9 pg (ref 26.0–34.0)
MCHC: 31.4 g/dL (ref 30.0–36.0)
MCV: 92.2 fL (ref 80.0–100.0)
Monocytes Absolute: 0.6 10*3/uL (ref 0.1–1.0)
Monocytes Relative: 7 %
Neutro Abs: 4.1 10*3/uL (ref 1.7–7.7)
Neutrophils Relative %: 53 %
Platelets: 235 10*3/uL (ref 150–400)
RBC: 4.74 MIL/uL (ref 3.87–5.11)
RDW: 14 % (ref 11.5–15.5)
WBC: 7.7 10*3/uL (ref 4.0–10.5)
nRBC: 0 % (ref 0.0–0.2)

## 2023-01-27 MED ORDER — STROKE: EARLY STAGES OF RECOVERY BOOK
Freq: Once | Status: AC
  Filled 2023-01-27: qty 1

## 2023-01-27 MED ORDER — SENNOSIDES-DOCUSATE SODIUM 8.6-50 MG PO TABS
1.0000 | ORAL_TABLET | Freq: Two times a day (BID) | ORAL | Status: DC
  Administered 2023-01-27 – 2023-01-30 (×3): 1 via ORAL
  Filled 2023-01-27 (×7): qty 1

## 2023-01-27 MED ORDER — ACETAMINOPHEN 325 MG PO TABS: 650.0000 mg | ORAL_TABLET | ORAL | Status: DC | PRN

## 2023-01-27 MED ORDER — INSULIN ASPART 100 UNIT/ML IJ SOLN
0.0000 [IU] | INTRAMUSCULAR | Status: DC
  Administered 2023-01-28: 2 [IU] via SUBCUTANEOUS
  Administered 2023-01-28 (×2): 3 [IU] via SUBCUTANEOUS
  Administered 2023-01-29: 2 [IU] via SUBCUTANEOUS
  Administered 2023-01-29: 3 [IU] via SUBCUTANEOUS
  Administered 2023-01-30: 2 [IU] via SUBCUTANEOUS
  Administered 2023-01-30 (×2): 5 [IU] via SUBCUTANEOUS
  Administered 2023-01-30 – 2023-01-31 (×2): 3 [IU] via SUBCUTANEOUS
  Administered 2023-01-31 (×2): 2 [IU] via SUBCUTANEOUS

## 2023-01-27 MED ORDER — LISINOPRIL 20 MG PO TABS
40.0000 mg | ORAL_TABLET | Freq: Every day | ORAL | Status: DC
  Filled 2023-01-27: qty 16

## 2023-01-27 MED ORDER — ACETAMINOPHEN 650 MG RE SUPP: 650.0000 mg | RECTAL | Status: DC | PRN

## 2023-01-27 MED ORDER — ACETAMINOPHEN 160 MG/5ML PO SOLN: 650.0000 mg | ORAL | Status: DC | PRN

## 2023-01-27 MED ORDER — ORAL CARE MOUTH RINSE: 15.0000 mL | OROMUCOSAL | Status: DC | PRN

## 2023-01-27 MED ORDER — CLEVIDIPINE BUTYRATE 0.5 MG/ML IV EMUL
0.0000 mg/h | INTRAVENOUS | Status: DC
  Administered 2023-01-27: 18 mg/h via INTRAVENOUS
  Administered 2023-01-27: 2 mg/h via INTRAVENOUS
  Administered 2023-01-28: 18 mg/h via INTRAVENOUS
  Administered 2023-01-28: 9 mg/h via INTRAVENOUS
  Filled 2023-01-27: qty 50
  Filled 2023-01-27 (×2): qty 100
  Filled 2023-01-27: qty 50

## 2023-01-27 MED ORDER — PANTOPRAZOLE SODIUM 40 MG IV SOLR
40.0000 mg | Freq: Every day | INTRAVENOUS | Status: DC
  Administered 2023-01-27: 40 mg via INTRAVENOUS
  Filled 2023-01-27: qty 10

## 2023-01-27 MED ORDER — CHLORHEXIDINE GLUCONATE CLOTH 2 % EX PADS
6.0000 | MEDICATED_PAD | Freq: Every day | CUTANEOUS | Status: DC
  Administered 2023-01-28 – 2023-01-30 (×3): 6 via TOPICAL

## 2023-01-27 MED ORDER — LABETALOL HCL 5 MG/ML IV SOLN
10.0000 mg | Freq: Once | INTRAVENOUS | Status: AC
  Administered 2023-01-27: 10 mg via INTRAVENOUS
  Filled 2023-01-27: qty 4

## 2023-01-27 MED ORDER — HYDROCHLOROTHIAZIDE 25 MG PO TABS: 25.0000 mg | ORAL_TABLET | Freq: Every day | ORAL | Status: DC

## 2023-01-27 NOTE — Clinical Note (Incomplete)
Stroke Neurology Consultation Note  Consult Requested by: ***  Reason for Consult: ***  Consult Date: 01/27/23   The history was obtained from the ***.  During history and examination, all items were *** able to obtain unless otherwise noted.  History of Present Illness:  [name] is a 67 y.o. {Race/ethnicity:17218} female with PMH of   LSN: *** tPA Given: {yes no:315493}  Past Medical History:  Diagnosis Date   Anemia    Blood transfusion 2010   associated with GI bleeding   CKD (chronic kidney disease) stage 2, GFR 60-89 ml/min    Depression    Diabetes mellitus    Diverticulosis of colon with hemorrhage 2010, 09/2011   H/O hiatal hernia    Helicobacter pylori gastritis 09/13/2013   Hx: UTI (urinary tract infection)    Hypertension    Personal history of colonic polyps    adenomas since 2010   Renal cell carcinoma 2004   lt kidney removed   Stroke [facility]) 12/2017   05/2021    Past Surgical History:  Procedure Laterality Date   ABDOMINAL HYSTERECTOMY  2004   BIOPSY  10/23/2019   Procedure: BIOPSY;  Surgeon: Lavena Bullion, DO;  Location: [facility];  Service: Gastroenterology;;   CHOLECYSTECTOMY     COLONOSCOPY  12/19/2008   diverticulosis   COLONOSCOPY  11/17/2011   Procedure: COLONOSCOPY;  Surgeon: Zenovia Jarred, MD;  Location: [facility];  Service: Gastroenterology;  Laterality: N/A;   DILATION AND CURETTAGE OF UTERUS N/A 07/11/2014   Procedure: Repair of Vaginal Cuff;  Surgeon: Melina Schools, MD;  Location: [facility];  Service: Gynecology;  Laterality: N/A;   ESOPHAGOGASTRODUODENOSCOPY (EGD) WITH PROPOFOL N/A 10/23/2019   Procedure: ESOPHAGOGASTRODUODENOSCOPY (EGD) WITH PROPOFOL;  Surgeon: Lavena Bullion, DO;  Location: [facility];  Service: Gastroenterology;  Laterality: N/A;   FLEXIBLE SIGMOIDOSCOPY  12/12/2008   diverticulosis   HAND SURGERY     x5 right   HYDRADENITIS EXCISION Right 03/21/2014   Procedure: EXCISION HIDRADENITIS AXILLA;   Surgeon: Pedro Earls, MD;  Location: [facility];  Service: General;  Laterality: Right;   KNEE ARTHROSCOPY     x2 left   NEPHRECTOMY  2013   left   TRIGGER FINGER RELEASE Left 06/23/2015   Procedure: RELEASE TRIGGER FINGER/A-1 PULLEY LEFT RING FINGER;  Surgeon: Daryll Brod, MD;  Location: [facility];  Service: Orthopedics;  Laterality: Left;   TUBAL LIGATION  1980   UPPER GASTROINTESTINAL ENDOSCOPY  12/12/2008   gastroporesis   XI ROBOTIC ASSISTED VENTRAL HERNIA N/A 01/21/2022   Procedure: ROBOTIC INCISIONAL HERNIA REPAIR WITH MESH, BILATERAL POSTERIOR RECTUS MYOFASCIAL RELEASE;  Surgeon: Felicie Morn, MD;  Location: [facility];  Service: General;  Laterality: N/A;    Family History  Problem Relation Age of Onset   Colon polyps Mother    Diabetes Maternal Grandmother    Breast cancer Maternal Grandmother    Diabetes Father    Breast cancer Daughter    Colon cancer Neg Hx     Social History:  reports that she has never smoked. She has never used smokeless tobacco. She reports that she does not drink alcohol and does not use drugs.  Allergies: No Known Allergies  No current facility-administered medications on file prior to encounter.   Current Outpatient Medications on File Prior to Encounter  Medication Sig Dispense Refill   albuterol (VENTOLIN HFA) 108 (90 Base) MCG/ACT inhaler Inhale 2 puffs into the lungs every 6 (six) hours as  needed for wheezing.     Exenatide (BYDUREON [place]) Inject 2.5 mg into the skin every Saturday.     fluticasone (FLONASE) 50 MCG/ACT nasal spray Place 1 spray into both nostrils daily as needed for allergies.     lisinopril-hydrochlorothiazide (ZESTORETIC) 20-12.5 MG tablet Take 2 tablets by mouth daily.     carvedilol (COREG) 6.25 MG tablet Take 6.25 mg by mouth 2 (two) times daily with a meal. (Patient not taking: Reported on 01/27/2023)      Review of Systems: A full ROS was attempted today and was *** able to be  performed.  Systems assessed include - Constitutional, Eyes, HENT, Respiratory, Cardiovascular, Gastrointestinal, Genitourinary, Integument/breast, Hematologic/lymphatic, Musculoskeletal, Neurological, Behavioral/Psych, Endocrine, Allergic/Immunologic - with pertinent responses as per HPI.  Physical Examination: Temp:  [98.1 F (36.7 C)-98.5 F (36.9 C)] 98.5 F (36.9 C) (03/15 2136) Pulse Rate:  [79-106] 95 (03/15 2200) Resp:  [12-21] 12 (03/15 2200) BP: (116-217)/(63-102) 145/77 (03/15 2200) SpO2:  [95 %-100 %] 97 % (03/15 2200) Weight:  BQ:[id number] kg] 103 kg (03/15 1642)  General - well nourished, well developed, in no apparent distress***.    Ophthalmologic - fundi not visualized due to noncooperation.    Cardiovascular - regular rhythm and rate***  Mental Status -  Level of arousal and orientation to time, place, and person were intact***. Language including expression, naming, repetition, comprehension was assessed and found intact***. Attention span and concentration were normal***. Recent and remote memory were intact***. Fund of Knowledge was assessed and was intact***.  Cranial Nerves II - XII - II - Vision intact OU***. III, IV, VI - Extraocular movements intact***. V - Facial sensation intact bilaterally***. VII - Facial movement intact bilaterally***. VIII - Hearing & vestibular intact bilaterally***. X - Palate elevates symmetrically***. XI - Chin turning & shoulder shrug intact bilaterally***. XII - Tongue protrusion intact***.  Motor Strength - The patient's strength was normal in all extremities and pronator drift was absent***.   Motor Tone & Bulk - Muscle tone was assessed at the neck and appendages and was normal.  Bulk was normal and fasciculations were absent***.   Reflexes - The patient's reflexes were normal in all extremities and she had no pathological reflexes***.  Sensory - Light touch, temperature/pinprick were assessed and were normal***.     Coordination - The patient had normal movements in the hands and feet with no ataxia or dysmetria.  Tremor was absent***.  Gait and Station - deferred  Data Reviewed: CT Head Wo Contrast  Result Date: 01/27/2023 CLINICAL DATA:  Neurologic deficit EXAM: CT HEAD WITHOUT CONTRAST TECHNIQUE: Contiguous axial images were obtained from the base of the skull through the vertex without intravenous contrast. RADIATION DOSE REDUCTION: This exam was performed according to the departmental dose-optimization program which includes automated exposure control, adjustment of the mA and/or kV according to patient size and/or use of iterative reconstruction technique. COMPARISON:  None Available. FINDINGS: Brain: There is a small intraparenchymal hematoma in the left thalamus measuring 12 x 9 x 8 mm (<1 mL). No midline shift or other mass effect. Old infarct of the right basal ganglia. There is periventricular hypoattenuation compatible with chronic microvascular disease. Vascular: Calcification of the internal carotid arteries at the skull base. Skull: Negative Sinuses/Orbits: Paranasal sinuses and mastoid air cells are clear. Normal orbits. Other: None IMPRESSION: 1. Small intraparenchymal hematoma in the left thalamus, measuring 12 x 9 x 8 mm (<1 mL). No midline shift or other mass effect. 2. Old infarct of the right  basal ganglia. Critical Value/emergent results were called by telephone at the time of interpretation on 01/27/2023 at 7:40 pm to provider Carmin Muskrat , who verbally acknowledged these results. Electronically Signed   By: Ulyses Jarred M.D.   On: 01/27/2023 19:40    Assessment: 67 y.o. female ***  Stroke Risk Factors - {Stroke risk factors:31118}  Plan: - HgbA1c, fasting lipid panel - MRI, MRA  of the brain without contrast - PT consult, OT consult, Speech consult - Echocardiogram - Carotid dopplers - Prophylactic therapy-{Vasm anticoagulants - no [id number] - Risk factor modification -  Telemetry monitoring - Frequent neuro checks  Thank you for this consultation and allowing [place] to participate in the care of this patient.

## 2023-01-27 NOTE — ED Provider Notes (Signed)
[facility] Provider Note   CSN: [id number] Arrival date & time: 01/27/23  1634     History  Chief Complaint  Patient presents with   Tingling    [name] is a 67 y.o. 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  Patient states that she was seated when event occurred.  Not associated with headache, nausea, vomiting, chest pain, shortness of breath, abdominal pain, weakness.  Patient has no urinary incontinence or bowel incontinence.  She does states she has had intermittent blurry vision.  She was recently placed on carvedilol, but has not started it.  Has been compliant with her other hypertensive medications which are HCTZ and lisinopril.  HPI     Home Medications Prior to Admission medications   Medication Sig Start Date End Date Taking? Authorizing Provider  albuterol (VENTOLIN HFA) 108 (90 Base) MCG/ACT inhaler Inhale 2 puffs into the lungs every 6 (six) hours as needed for wheezing. 10/22/18  Yes [provider]  Exenatide ER 2 MG PEN Inject 2 mg into the skin every Saturday. 01/19/18  Yes [provider]  fluticasone (FLONASE) 50 MCG/ACT nasal spray Place 1 spray into both nostrils daily as needed for allergies.   Yes [provider]  lisinopril-hydrochlorothiazide (ZESTORETIC) 20-12.5 MG tablet Take 2 tablets by mouth daily.   Yes [provider]  carvedilol (COREG) 6.25 MG tablet Take 6.25 mg by mouth 2 (two) times daily with a meal. Patient not taking: Reported on 01/27/2023 01/18/23   [provider]      Allergies    Patient has no known allergies.    Review of Systems   See HPI  Physical Exam Updated Vital Signs BP (!) 154/68 Comment: clevi increased  Pulse 96   Temp 98.5 F (36.9 C) (Oral)   Resp 16   Ht 5\' 2"  (1.575 m)   Wt 103 kg   LMP  11/16/2002   SpO2 95%   BMI 41.52 kg/m  Physical Exam Vitals and nursing note reviewed.  Constitutional:      General: She is not in acute distress.    Appearance: She is well-developed.  HENT:     Head: Normocephalic and atraumatic.     Nose: Nose normal.     Mouth/Throat:     Mouth: Mucous membranes are moist.     Pharynx: Oropharynx is clear.  Eyes:     Extraocular Movements: Extraocular movements intact.     Conjunctiva/sclera: Conjunctivae normal.     Pupils: Pupils are equal, round, and reactive to light.  Cardiovascular:     Rate and Rhythm: Normal rate and regular rhythm.     Heart sounds: No murmur heard. Pulmonary:     Effort: Pulmonary effort is normal. No respiratory distress.     Breath sounds: Normal breath sounds.  Abdominal:     General: Abdomen is flat. There is no distension.     Palpations: Abdomen is soft.     Tenderness: There is no abdominal tenderness.  Musculoskeletal:        General: No swelling.     Cervical back: Neck supple. No tenderness.  Skin:    General: Skin is warm and dry.     Capillary Refill: Capillary refill takes less than 2 seconds.  Neurological:     Mental Status: She is alert and oriented to person, place,  and time.     Sensory: Sensory deficit (Decree sensation on right side of face, RUE, RLE) present.     Motor: No weakness.     Coordination: Coordination normal.     Gait: Gait normal.  Psychiatric:        Mood and Affect: Mood normal.     ED Results / Procedures / Treatments   Labs (all labs ordered are listed, but only abnormal results are displayed) Labs Reviewed  COMPREHENSIVE METABOLIC PANEL - Abnormal; Notable for the following components:      Result Value   Glucose, Bld 187 (*)    Creatinine, Ser 1.72 (*)    Calcium 8.4 (*)    Albumin 2.5 (*)    Total Bilirubin 0.2 (*)    GFR, Estimated 33 (*)    All other components within normal limits  MRSA NEXT GEN BY PCR, NASAL  CBC WITH DIFFERENTIAL/PLATELET  HIV  ANTIBODY (ROUTINE TESTING W REFLEX)  HEMOGLOBIN A1C    EKG EKG Interpretation  Date/Time:  Friday January 27 2023 16:43:07 EDT Ventricular Rate:  102 PR Interval:  186 QRS Duration: 96 QT Interval:  346 QTC Calculation: 456 R Axis:   116 Text Interpretation: Sinus tachycardia Right axis deviation ST-t wave abnormality Artifact Abnormal ECG Confirmed by Carmin Muskrat [phone]) on 01/27/2023 5:36:47 PM  Radiology CT Head Wo Contrast  Result Date: 01/27/2023 CLINICAL DATA:  Neurologic deficit EXAM: CT HEAD WITHOUT CONTRAST TECHNIQUE: Contiguous axial images were obtained from the base of the skull through the vertex without intravenous contrast. RADIATION DOSE REDUCTION: This exam was performed according to the departmental dose-optimization program which includes automated exposure control, adjustment of the mA and/or kV according to patient size and/or use of iterative reconstruction technique. COMPARISON:  None Available. FINDINGS: Brain: There is a small intraparenchymal hematoma in the left thalamus measuring 12 x 9 x 8 mm (<1 mL). No midline shift or other mass effect. Old infarct of the right basal ganglia. There is periventricular hypoattenuation compatible with chronic microvascular disease. Vascular: Calcification of the internal carotid arteries at the skull base. Skull: Negative Sinuses/Orbits: Paranasal sinuses and mastoid air cells are clear. Normal orbits. Other: None IMPRESSION: 1. Small intraparenchymal hematoma in the left thalamus, measuring 12 x 9 x 8 mm (<1 mL). No midline shift or other mass effect. 2. Old infarct of the right basal ganglia. Critical Value/emergent results were called by telephone at the time of interpretation on 01/27/2023 at 7:40 pm to provider Carmin Muskrat , who verbally acknowledged these results. Electronically Signed   By: Ulyses Jarred M.D.   On: 01/27/2023 19:40    Medications Ordered in ED Medications   stroke: early stages of recovery book (has no  administration in time range)  acetaminophen (TYLENOL) tablet 650 mg (has no administration in time range)    Or  acetaminophen (TYLENOL) 160 MG/5ML solution 650 mg (has no administration in time range)    Or  acetaminophen (TYLENOL) suppository 650 mg (has no administration in time range)  senna-docusate (Senokot-S) tablet 1 tablet (1 tablet Oral Given 01/27/23 2258)  pantoprazole (PROTONIX) injection 40 mg (40 mg Intravenous Given 01/27/23 2258)  clevidipine (CLEVIPREX) infusion 0.5 mg/mL (12 mg/hr Intravenous Infusion Verify 01/27/23 2300)  Chlorhexidine Gluconate Cloth 2 % PADS 6 each (has no administration in time range)  Oral care mouth rinse (has no administration in time range)  insulin aspart (novoLOG) injection 0-15 Units (has no administration in time range)  lisinopril (ZESTRIL) tablet 40 mg (has  no administration in time range)  hydrochlorothiazide (HYDRODIURIL) tablet 25 mg (has no administration in time range)  labetalol (NORMODYNE) injection 10 mg (10 mg Intravenous Given 01/27/23 1724)    ED Course/ Medical Decision Making/ A&P   {  Medical Decision Making Amount and/or Complexity of Data Reviewed Labs: ordered. Radiology: ordered.  Risk Prescription drug management. Decision regarding hospitalization.   Patient presented hypertensive with SBP greater than 200, slightly tachycardic but saturating well on room air.  She had decreased sensation on right side of face, RUE and RLE on exam but no other focal neurofindings.  She was seen ambulating to the bathroom without difficulty.  And had no abnormal coordination.  She was given dose of labetalol with appropriate response of her blood pressure.  CT head revealed new IPH in the left thalamus.  Labs otherwise were unremarkable.  EKG did not show signs of acute ischemia or arrhythmia, sinus tachycardia.  Discussed patient with neurology who agreed to evaluate the patient and admit to the hospital for further care and  observation.        Final Clinical Impression(s) / ED Diagnoses Final diagnoses:  Intraparenchymal hemorrhage of brain [facility])     Bradd Canary, MD 01/27/23 2325    Carmin Muskrat, MD 01/28/23 2150

## 2023-01-27 NOTE — H&P (Addendum)
Neurology H&P  CC: Left-sided numbness  History is obtained from: Patient  HPI: [name] is a 67 y.o. 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  She states that it started around 4 PM and the numbness has been persistent since that time.  The tingling has improved.  Due to the symptoms, she sought care in the emergency department where she had a head CT performed which demonstrates a small intraparenchymal hematoma.  She has been very hypertensive with systolics in the [id number].  She states that she has been compliant with her antihypertensives, but she was supposed to start a new one soon and has not had a chance to start it yet.  She also notes that she had a transient episode of diplopia earlier today, this is resolved.   LKW: 4 PM tpa given?: No, ICH IR Thrombectomy? No, no LVO Modified Rankin Scale: 1-No significant post stroke disability and can perform usual duties with stroke symptoms NIHSS: 3 ICH score: Zero  Past Medical History:  Diagnosis Date   Anemia    Blood transfusion 2010   associated with GI bleeding   CKD (chronic kidney disease) stage 2, GFR 60-89 ml/min    Depression    Diabetes mellitus    Diverticulosis of colon with hemorrhage 2010, 09/2011   H/O hiatal hernia    Helicobacter pylori gastritis 09/13/2013   Hx: UTI (urinary tract infection)    Hypertension    Personal history of colonic polyps    adenomas since 2010   Renal cell carcinoma 2004   lt kidney removed   Stroke [facility]) 12/2017   05/2021     Family History  Problem Relation Age of Onset   Colon polyps Mother    Diabetes Maternal Grandmother    Breast cancer Maternal Grandmother    Diabetes Father    Breast cancer Daughter    Colon cancer Neg Hx      Social History:  reports that she has never smoked. She has never used smokeless tobacco. She reports that she does not drink  alcohol and does not use drugs.   Prior to Admission medications   Medication Sig Start Date End Date Taking? Authorizing Provider  albuterol (VENTOLIN HFA) 108 (90 Base) MCG/ACT inhaler Inhale 2 puffs into the lungs every 6 (six) hours as needed for wheezing. 10/22/18  Yes [provider]  Exenatide ER 2 MG PEN Inject 2 mg into the skin every Saturday. 01/19/18  Yes [provider]  fluticasone (FLONASE) 50 MCG/ACT nasal spray Place 1 spray into both nostrils daily as needed for allergies.   Yes [provider]  lisinopril-hydrochlorothiazide (ZESTORETIC) 20-12.5 MG tablet Take 2 tablets by mouth daily.   Yes [provider]  carvedilol (COREG) 6.25 MG tablet Take 6.25 mg by mouth 2 (two) times daily with a meal. Patient not taking: Reported on 01/27/2023 01/18/23   [provider]     Exam: Current vital signs: BP (!) 148/86   Pulse 94   Temp 98.5 F (36.9 C) (Oral)   Resp 13   Ht 5\' 2"  (1.575 m)   Wt 103 kg   LMP 11/16/2002   SpO2 98%   BMI 41.52 kg/m    Physical Exam  Constitutional: Appears well-developed and well-nourished.  Psych: Affect appropriate to situation Eyes: No scleral injection HENT: No OP obstrucion Head: Normocephalic.  Cardiovascular: Normal rate and regular rhythm.  Respiratory: Effort normal and breath sounds normal to anterior ascultation GI: Soft.  No distension. There is no tenderness.  Skin: WDI  Neuro: Mental Status: Patient is awake, alert, oriented to person, place, month, year, and situation. Patient is able to give a clear and coherent history. No signs of aphasia or neglect Cranial Nerves: II: Visual Fields are full. Pupils are equal, round, and reactive to light.   III,IV, VI: EOMI without ptosis or diploplia.  V: Facial sensation is decreased on the right VII: Facial movement is symmetric.  VIII: hearing is intact to voice X: Uvula elevates symmetrically XI: Shoulder shrug is symmetric. XII:  tongue is midline without atrophy or fasciculations.  Motor: Tone is normal. Bulk is normal. 5/5 strength was present in all four extremities.  Sensory: Sensation is decreased on the right Cerebellar: She is slightly ataxic on finger-nose-finger and heel-knee-shin in both right arm and leg   I have reviewed labs in epic and the pertinent results are: Creatinine 1.72  I have reviewed the images obtained: Small left thalamic hematoma  Primary Diagnosis:  Intracerebral hemorrhage in left subcortical region  Secondary Diagnosis: Hypertension Emergency (SBP > 180 or DBP > 120 & end organ damage) and Type 2 diabetes mellitus with hyperglycemia CKD stage III   Impression: 67 year old female with small left thalamic intracerebral hemorrhage.  She will need to be admitted for acute blood pressure management.  Her history of ischemic stroke, in fact that she does not typically have this much trouble with her blood pressure as well as a transient diplopia earlier, does raise the possibility of ischemic stroke with hemorrhagic conversion.  I do think an MRI would be helpful, but unfortunately she is very claustrophobic and I would favor not sedating her at the current time.  Plan: 1) Admit to ICU 2) no antiplatelets or anticoagulants 3) blood pressure control with goal systolic [id number] 4) Frequent neuro checks 5) If symptoms worsen or there is decreased mental status, repeat stat head CT 6) PT,OT,ST 7) MRI brain 8) resumed home antihypertensives 9) Cleviprex in the acute phase 10) SSI for DM   This patient is critically ill and at significant risk of neurological worsening, death and care requires constant monitoring of vital signs, hemodynamics,respiratory and cardiac monitoring, neurological assessment, discussion with family, other specialists and medical decision making of high complexity. I spent 50 minutes of neurocritical care time  in the care of  this patient. This was time spent  independent of any time provided by nurse practitioner or PA.  Roland Rack, MD [facility] [phone]  If 7pm- 7am, please page neurology on call as listed in [place].

## 2023-01-27 NOTE — ED Notes (Signed)
Handoff report given to [place]

## 2023-01-27 NOTE — ED Triage Notes (Signed)
Pt present to ED from home with c/o right side tingling, onset one hour ago. Pt states hx of stroke and states same symptoms as previous stroke. Pt c/o facial tightness and loss of sensation to right arm. Pt denies pain/discomfort at this time. Pt A&Ox4 at this time.

## 2023-01-27 NOTE — ED Notes (Addendum)
Report given to 4N RN

## 2023-01-28 ENCOUNTER — Inpatient Hospital Stay (HOSPITAL_COMMUNITY): Payer: Medicare Other

## 2023-01-28 DIAGNOSIS — Z8673 Personal history of transient ischemic attack (TIA), and cerebral infarction without residual deficits: Secondary | ICD-10-CM

## 2023-01-28 DIAGNOSIS — Z8679 Personal history of other diseases of the circulatory system: Secondary | ICD-10-CM

## 2023-01-28 DIAGNOSIS — R4182 Altered mental status, unspecified: Secondary | ICD-10-CM

## 2023-01-28 DIAGNOSIS — I6389 Other cerebral infarction: Secondary | ICD-10-CM

## 2023-01-28 DIAGNOSIS — I610 Nontraumatic intracerebral hemorrhage in hemisphere, subcortical: Secondary | ICD-10-CM | POA: Diagnosis not present

## 2023-01-28 DIAGNOSIS — I10 Essential (primary) hypertension: Secondary | ICD-10-CM | POA: Diagnosis not present

## 2023-01-28 LAB — ECHOCARDIOGRAM COMPLETE
AR max vel: 2.87 cm2
AV Area VTI: 2.85 cm2
AV Area mean vel: 2.76 cm2
AV Mean grad: 3 mmHg
AV Peak grad: 6 mmHg
Ao pk vel: 1.22 m/s
Area-P 1/2: 3.65 cm2
Height: 62 in
S' Lateral: 3.1 cm
Weight: 3632 oz

## 2023-01-28 LAB — GLUCOSE, CAPILLARY
Glucose-Capillary: 104 mg/dL — ABNORMAL HIGH (ref 70–99)
Glucose-Capillary: 119 mg/dL — ABNORMAL HIGH (ref 70–99)
Glucose-Capillary: 121 mg/dL — ABNORMAL HIGH (ref 70–99)
Glucose-Capillary: 124 mg/dL — ABNORMAL HIGH (ref 70–99)
Glucose-Capillary: 148 mg/dL — ABNORMAL HIGH (ref 70–99)
Glucose-Capillary: 195 mg/dL — ABNORMAL HIGH (ref 70–99)

## 2023-01-28 LAB — RAPID URINE DRUG SCREEN, HOSP PERFORMED
Amphetamines: NOT DETECTED
Barbiturates: NOT DETECTED
Benzodiazepines: NOT DETECTED
Cocaine: NOT DETECTED
Opiates: NOT DETECTED
Tetrahydrocannabinol: NOT DETECTED

## 2023-01-28 LAB — HIV ANTIBODY (ROUTINE TESTING W REFLEX): HIV Screen 4th Generation wRfx: NONREACTIVE

## 2023-01-28 LAB — MRSA NEXT GEN BY PCR, NASAL: MRSA by PCR Next Gen: NOT DETECTED

## 2023-01-28 MED ORDER — LORAZEPAM 1 MG PO TABS
2.0000 mg | ORAL_TABLET | Freq: Once | ORAL | Status: AC | PRN
  Administered 2023-01-28: 2 mg via ORAL
  Filled 2023-01-28: qty 2

## 2023-01-28 MED ORDER — PANTOPRAZOLE SODIUM 40 MG PO TBEC
40.0000 mg | DELAYED_RELEASE_TABLET | Freq: Every day | ORAL | Status: DC
  Administered 2023-01-28 – 2023-01-31 (×4): 40 mg via ORAL
  Filled 2023-01-28 (×5): qty 1

## 2023-01-28 MED ORDER — LABETALOL HCL 5 MG/ML IV SOLN
20.0000 mg | Freq: Once | INTRAVENOUS | Status: AC
  Administered 2023-01-28: 20 mg via INTRAVENOUS
  Filled 2023-01-28: qty 4

## 2023-01-28 MED ORDER — ROSUVASTATIN CALCIUM 20 MG PO TABS
20.0000 mg | ORAL_TABLET | Freq: Every day | ORAL | Status: DC
  Administered 2023-01-28 – 2023-01-31 (×4): 20 mg via ORAL
  Filled 2023-01-28 (×5): qty 1

## 2023-01-28 MED ORDER — LABETALOL HCL 5 MG/ML IV SOLN
INTRAVENOUS | Status: AC
  Filled 2023-01-28: qty 4

## 2023-01-28 MED ORDER — AMLODIPINE BESYLATE 10 MG PO TABS
10.0000 mg | ORAL_TABLET | Freq: Every day | ORAL | Status: DC
  Administered 2023-01-28 – 2023-01-31 (×4): 10 mg via ORAL
  Filled 2023-01-28 (×5): qty 1

## 2023-01-28 MED ORDER — LABETALOL HCL 5 MG/ML IV SOLN
10.0000 mg | INTRAVENOUS | Status: DC | PRN
  Administered 2023-01-28 – 2023-01-30 (×5): 10 mg via INTRAVENOUS
  Filled 2023-01-28 (×4): qty 4

## 2023-01-28 MED ORDER — CARVEDILOL 6.25 MG PO TABS
6.2500 mg | ORAL_TABLET | Freq: Two times a day (BID) | ORAL | Status: DC
  Administered 2023-01-28 – 2023-01-31 (×6): 6.25 mg via ORAL
  Filled 2023-01-28 (×4): qty 2
  Filled 2023-01-28 (×2): qty 1

## 2023-01-28 NOTE — Progress Notes (Signed)
Approximately 1600, I woke pt from rest, and she was stuporous. She took noxious stim to arouse. When she was alert, she would not talk or follow commands. MD Xu notified, orders received, CT head stat, EEG complete.

## 2023-01-28 NOTE — Progress Notes (Signed)
Physical Therapy Evaluation Patient Details Name: [name] MRN: [id number] DOB: 1956-05-08 Today's Date: 01/28/2023  History of Present Illness  67 y.o. 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  CT performed which demonstrates a small left intraparenchymal hematoma; SBP 200s;  Clinical Impression   Pt admitted secondary to problem above with deficits below. PTA patient was living with grandson and independent with all mobility and ADLs. She no longer used an assistive device or adaptive equipment.  Pt currently requires +2 min assist to ambulate in the room with pt limited by dizziness (BP 147/96). Anticipate good progress as pt acclimates to lower BP and continues to mobilize. Anticipate patient will benefit from PT to address problems listed below.Will continue to follow acutely to maximize functional mobility independence and safety.          Recommendations for follow up therapy are one component of a multi-disciplinary discharge planning process, led by the attending physician.  Recommendations may be updated based on patient status, additional functional criteria and insurance authorization.  Follow Up Recommendations Home health PT      Assistance Recommended at Discharge Intermittent Supervision/Assistance  Patient can return home with the following  A little help with walking and/or transfers;Assistance with cooking/housework;Assist for transportation    Equipment Recommendations None recommended by PT  Recommendations for Other Services       Functional Status Assessment Patient has had a recent decline in their functional status and demonstrates the ability to make significant improvements in function in a reasonable and predictable amount of time.     Precautions / Restrictions Precautions Precautions: Fall;Other (comment) Precaution Comments: SBP  130-150 Restrictions Weight Bearing Restrictions: No      Mobility  Bed Mobility Overal bed mobility: Modified Independent             General bed mobility comments: including managing linens    Transfers Overall transfer level: Needs assistance Equipment used: 2 person hand held assist Transfers: Sit to/from Stand, Bed to chair/wheelchair/BSC Sit to Stand: Min assist, +2 physical assistance   Step pivot transfers: Min assist, +2 physical assistance       General transfer comment: pt reported feeling dizzy (BP 147/96); requested 2 person HHA for come to stand and to walk ~3 ft to chair    Ambulation/Gait Ambulation/Gait assistance: Min assist, +2 physical assistance Gait Distance (Feet): 3 Feet Assistive device: 2 person hand held assist Gait Pattern/deviations: Step-to pattern, Decreased stride length, Shuffle       General Gait Details: pt feeling dizzy and very cautious with ambulation  Stairs            Wheelchair Mobility    Modified Rankin (Stroke Patients Only) Modified Rankin (Stroke Patients Only) Pre-Morbid Rankin Score: Slight disability Modified Rankin: Moderately severe disability     Balance Overall balance assessment: Needs assistance Sitting-balance support: No upper extremity supported, Feet supported Sitting balance-Leahy Scale: Good     Standing balance support: Bilateral upper extremity supported Standing balance-Leahy Scale: Poor                               Pertinent Vitals/Pain Pain Assessment Pain Assessment: No/denies pain    Home Living Family/patient expects to be discharged to:: Private residence Living Arrangements: Other relatives (grandson) Available Help at Discharge: Family;Available PRN/intermittently Type of Home: Apartment Home Access: Level entry  Home Layout: One level Home Equipment: Conservation officer, nature (2 wheels);BSC/3in1;Adaptive equipment      Prior Function Prior Level of  Function : Independent/Modified Independent;Driving             Mobility Comments: independent ADLs Comments: stands in shower now (used to use BSC in tub to sit); does housework, goes shopping     Hand Dominance   Dominant Hand: Right    Extremity/Trunk Assessment   Upper Extremity Assessment Upper Extremity Assessment: Defer to OT evaluation    Lower Extremity Assessment Lower Extremity Assessment: Overall WFL for tasks assessed (rt knee extension and DF 5/5; sensation "normal" per pt)    Cervical / Trunk Assessment Cervical / Trunk Assessment: Other exceptions Cervical / Trunk Exceptions: overweight  Communication   Communication: No difficulties  Cognition Arousal/Alertness: Awake/alert Behavior During Therapy: WFL for tasks assessed/performed Overall Cognitive Status: Within Functional Limits for tasks assessed                                          General Comments      Exercises     Assessment/Plan    PT Assessment Patient needs continued PT services  PT Problem List Decreased activity tolerance;Decreased balance;Decreased mobility;Decreased knowledge of use of DME;Decreased knowledge of precautions       PT Treatment Interventions DME instruction;Gait training;Functional mobility training;Therapeutic activities;Balance training;Neuromuscular re-education;Patient/family education    PT Goals (Current goals can be found in the Care Plan section)  Acute Rehab PT Goals Patient Stated Goal: be able to return home PT Goal Formulation: With patient Time For Goal Achievement: 02/11/23 Potential to Achieve Goals: Good    Frequency Min 4X/week     Co-evaluation PT/OT/SLP Co-Evaluation/Treatment: Yes Reason for Co-Treatment: For patient/therapist safety;To address functional/ADL transfers PT goals addressed during session: Mobility/safety with mobility;Balance         AM-PAC PT "6 Clicks" Mobility  Outcome Measure Help needed  turning from your back to your side while in a flat bed without using bedrails?: None Help needed moving from lying on your back to sitting on the side of a flat bed without using bedrails?: None Help needed moving to and from a bed to a chair (including a wheelchair)?: Total Help needed standing up from a chair using your arms (e.g., wheelchair or bedside chair)?: Total Help needed to walk in hospital room?: Total Help needed climbing 3-5 steps with a railing? : Total 6 Click Score: 12    End of Session   Activity Tolerance: Treatment limited secondary to medical complications (Comment) (limited by dizziness) Patient left: in chair;with call bell/phone within reach;with chair alarm set;Other (comment) (with OT) Nurse Communication: Mobility status PT Visit Diagnosis: Unsteadiness on feet (R26.81);Difficulty in walking, not elsewhere classified (R26.2)    Time: [id number] PT Time Calculation (min) (ACUTE ONLY): 21 min   Charges:   PT Evaluation $PT Eval Low Complexity: [place], PT Acute Rehabilitation Services  Office [phone]   Rexanne Mano 01/28/2023, 12:31 PM

## 2023-01-28 NOTE — Progress Notes (Signed)
VASCULAR LAB    Carotid duplex has been performed.  See CV proc for preliminary results.   Mackinley Cassaday, RVT 01/28/2023, 4:04 PM

## 2023-01-28 NOTE — Progress Notes (Addendum)
STROKE TEAM PROGRESS NOTE   INTERVAL HISTORY Patient is seen in her room with 1 family member at the bedside.  Yesterday, she developed right-sided and numbness and tingling.  She also had a transient episode of diplopia.  The tingling improved, but the numbness was persistent so she sought care in the ED and was found to have a small left thalamic IPH.  She was noted to be quite hypertensive on arrival with systolics in the 123456, but this has improved.  Vitals:   01/28/23 1000 01/28/23 1015 01/28/23 1048 01/28/23 1200  BP: 135/72 123/73 (!) 143/73 (!) 129/97  Pulse: 83 86  86  Resp: 12 16  14   Temp:    98.4 F (36.9 C)  TempSrc:    Axillary  SpO2: 100% 97%  98%  Weight:      Height:       CBC:  Recent Labs  Lab 01/27/23 1647  WBC 7.7  NEUTROABS 4.1  HGB 13.7  HCT 43.7  MCV 92.2  PLT [id number]   Basic Metabolic Panel:  Recent Labs  Lab 01/27/23 1647  NA 138  K 4.1  CL 104  CO2 24  GLUCOSE 187*  BUN 23  CREATININE 1.72*  CALCIUM 8.4*   Lipid Panel: No results for input(s): "CHOL", "TRIG", "HDL", "CHOLHDL", "VLDL", "LDLCALC" in the last 168 hours. HgbA1c: No results for input(s): "HGBA1C" in the last 168 hours. Urine Drug Screen: No results for input(s): "LABOPIA", "COCAINSCRNUR", "LABBENZ", "AMPHETMU", "THCU", "LABBARB" in the last 168 hours.  Alcohol Level No results for input(s): "ETH" in the last 168 hours.  IMAGING past 24 hours ECHOCARDIOGRAM COMPLETE  Result Date: 01/28/2023    ECHOCARDIOGRAM REPORT   Patient Name:   NAHJAE NAEEM Date of Exam: 01/28/2023 Medical Rec #:  [id number]          Height:       62.0 in Accession #:    [id number]         Weight:       227.0 lb Date of Birth:  1956-08-25         BSA:          2.017 m Patient Age:    67 years           BP:           142/77 mmHg Patient Gender: F                  HR:           84 bpm. Exam Location:  Inpatient Procedure: 2D Echo, Cardiac Doppler and Color Doppler Indications:    Stroke I63.9  History:         Patient has prior history of Echocardiogram examinations, most                 recent 05/19/2021. Stroke, Signs/Symptoms:Chest Pain; Risk                 Factors:Hypertension, Dyslipidemia and Diabetes. CKD, stage 2.  Sonographer:    Ronny Flurry Referring Phys: Merlín.Osler MCNEILL P [place]  1. Left ventricular ejection fraction, by estimation, is 60 to 65%. The left ventricle has normal function. The left ventricle has no regional wall motion abnormalities. There is mild left ventricular hypertrophy of the septal segment. Left ventricular diastolic parameters are consistent with Grade I diastolic dysfunction (impaired relaxation).  2. Right ventricular systolic function is normal. The right ventricular size is normal.  3. The mitral valve  is normal in structure. No evidence of mitral valve regurgitation. No evidence of mitral stenosis.  4. The aortic valve is tricuspid. There is mild calcification of the aortic valve. Aortic valve regurgitation is not visualized. Aortic valve sclerosis is present, with no evidence of aortic valve stenosis. Aortic valve mean gradient measures 3.0 mmHg. Aortic valve Vmax measures 1.22 m/s.  5. The inferior vena cava is normal in size with greater than 50% respiratory variability, suggesting right atrial pressure of 3 mmHg. Conclusion(s)/Recommendation(s): No intracardiac source of embolism detected on this transthoracic study. Consider a transesophageal echocardiogram to exclude cardiac source of embolism if clinically indicated. FINDINGS  Left Ventricle: Left ventricular ejection fraction, by estimation, is 60 to 65%. The left ventricle has normal function. The left ventricle has no regional wall motion abnormalities. The left ventricular internal cavity size was normal in size. There is  mild left ventricular hypertrophy of the septal segment. Left ventricular diastolic parameters are consistent with Grade I diastolic dysfunction (impaired relaxation). Right Ventricle:  The right ventricular size is normal. No increase in right ventricular wall thickness. Right ventricular systolic function is normal. Left Atrium: Left atrial size was normal in size. Right Atrium: Right atrial size was normal in size. Pericardium: There is no evidence of pericardial effusion. Presence of epicardial fat layer. Mitral Valve: The mitral valve is normal in structure. No evidence of mitral valve regurgitation. No evidence of mitral valve stenosis. Tricuspid Valve: The tricuspid valve is normal in structure. Tricuspid valve regurgitation is not demonstrated. No evidence of tricuspid stenosis. Aortic Valve: The aortic valve is tricuspid. There is mild calcification of the aortic valve. Aortic valve regurgitation is not visualized. Aortic valve sclerosis is present, with no evidence of aortic valve stenosis. Aortic valve mean gradient measures 3.0 mmHg. Aortic valve peak gradient measures 6.0 mmHg. Aortic valve area, by VTI measures 2.85 cm. Pulmonic Valve: The pulmonic valve was normal in structure. Pulmonic valve regurgitation is not visualized. No evidence of pulmonic stenosis. Aorta: The aortic root is normal in size and structure. Venous: The inferior vena cava is normal in size with greater than 50% respiratory variability, suggesting right atrial pressure of 3 mmHg. IAS/Shunts: No atrial level shunt detected by color flow Doppler.  LEFT VENTRICLE PLAX 2D LVIDd:         5.00 cm   Diastology LVIDs:         3.10 cm   LV e' medial:    7.23 cm/s LV PW:         1.10 cm   LV E/e' medial:  9.9 LV IVS:        1.20 cm   LV e' lateral:   6.09 cm/s LVOT diam:     2.10 cm   LV E/e' lateral: 11.8 LV SV:         64 LV SV Index:   32 LVOT Area:     3.46 cm  RIGHT VENTRICLE             IVC RV S prime:     12.80 cm/s  IVC diam: 1.60 cm LEFT ATRIUM             Index LA diam:        3.10 cm 1.54 cm/m LA Vol (A2C):   40.9 ml 20.27 ml/m LA Vol (A4C):   52.4 ml 25.97 ml/m LA Biplane Vol: 49.0 ml 24.29 ml/m  AORTIC  VALVE AV Area (Vmax):    2.87 cm AV Area (Vmean):  2.76 cm AV Area (VTI):     2.85 cm AV Vmax:           122.00 cm/s AV Vmean:          82.100 cm/s AV VTI:            0.225 m AV Peak Grad:      6.0 mmHg AV Mean Grad:      3.0 mmHg LVOT Vmax:         101.23 cm/s LVOT Vmean:        65.400 cm/s LVOT VTI:          0.185 m LVOT/AV VTI ratio: 0.82  AORTA Ao Root diam: 3.00 cm Ao Asc diam:  2.60 cm MITRAL VALVE MV Area (PHT): 3.65 cm    SHUNTS MV Decel Time: 208 msec    Systemic VTI:  0.19 m MV E velocity: 71.60 cm/s  Systemic Diam: 2.10 cm MV A velocity: 97.30 cm/s MV E/A ratio:  0.74 Candee Furbish MD Electronically signed by Candee Furbish MD Signature Date/Time: 01/28/2023/12:44:14 PM    Final    CT Head Wo Contrast  Result Date: 01/27/2023 CLINICAL DATA:  Neurologic deficit EXAM: CT HEAD WITHOUT CONTRAST TECHNIQUE: Contiguous axial images were obtained from the base of the skull through the vertex without intravenous contrast. RADIATION DOSE REDUCTION: This exam was performed according to the departmental dose-optimization program which includes automated exposure control, adjustment of the mA and/or kV according to patient size and/or use of iterative reconstruction technique. COMPARISON:  None Available. FINDINGS: Brain: There is a small intraparenchymal hematoma in the left thalamus measuring 12 x 9 x 8 mm (<1 mL). No midline shift or other mass effect. Old infarct of the right basal ganglia. There is periventricular hypoattenuation compatible with chronic microvascular disease. Vascular: Calcification of the internal carotid arteries at the skull base. Skull: Negative Sinuses/Orbits: Paranasal sinuses and mastoid air cells are clear. Normal orbits. Other: None IMPRESSION: 1. Small intraparenchymal hematoma in the left thalamus, measuring 12 x 9 x 8 mm (<1 mL). No midline shift or other mass effect. 2. Old infarct of the right basal ganglia. Critical Value/emergent results were called by telephone at the time of  interpretation on 01/27/2023 at 7:40 pm to provider Carmin Muskrat , who verbally acknowledged these results. Electronically Signed   By: Ulyses Jarred M.D.   On: 01/27/2023 19:40    PHYSICAL EXAM General: Alert, well-nourished, well-developed patient in no acute distress Respiratory: Regular, unlabored respirations on room air  NEURO:  Mental Status: AA&Ox3  Speech/Language: speech is without dysarthria or aphasia.   Cranial Nerves:  II: PERRL. Visual fields full.  III, IV, VI: EOMI. Eyelids elevate symmetrically.  V: Sensation is intact to light touch but diminished on right side of face VII: Smile is symmetrical.  VIII: hearing intact to voice. IX, X:Phonation is normal.  [id number] shrug 5/5. XII: tongue is midline without fasciculations. Motor: 5/5 strength to all muscle groups tested.  Tone: is normal and bulk is normal Sensation- Intact to light touch bilaterally but diminished in left arm, symmetrical in legs Coordination: FTN intact bilaterally.No drift.  Gait- deferred    ASSESSMENT/PLAN Ms. Monai Sugawara is a 67 y.o. 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  She also had a transient episode of diplopia.  The tingling improved, but the numbness was persistent so she sought care in the ED and was  found to have a small left thalamic IPH.  She was noted to be quite hypertensive on arrival with systolics in the 123456, but this has improved.  ICH score 0  ICH:  left thalamic ICH, likely Hypertension Stroke: left frontoparietal WM infarcts, likely small vessel disease CT head small IPH in left thalamus MRI stable small left thalamic IPH, small subacute ischemic infarcts in left frontoparietal white matter MRA pending Carotid Doppler pending 2D Echo 60 to 65%  LDL pending HgbA1c pending UDS pending VTE prophylaxis -SCDs No antithrombotic prior to  admission, now on No antithrombotic secondary to IPH Therapy recommendations: Home health PT/OT  Disposition: Pending  History of ICH and stroke 12/2017 admitted for fall with left-sided weakness.  CT showed right BG ICH.  CT head and neck unremarkable.  EF 60 to 65%.  Carotid Doppler negative.  UDS negative.  LDL 118.  With residual left-sided numbness 08/2019 follow-up with [facility], put on aspirin 81. 05/2021 admitted for right thalamic infarct.  MRI showed right left M2 occlusion and left M2 stenosis.  EF 60 to 65%, LDL 152, A1c 7.2, discharged on DAPT and Lipitor 80.  Hypertension Home meds: coreg 6.25 mg daily, lisinopril-hydrochlorothiazide 20-12.5 mg daily Wean off Cleviprex if able Resume home coreg Start amlodipine 10 mg daily Hold off ACEI and diuretics due to elevated Cre BP goal less than 160 Long-term BP goal normotensive  Hyperlipidemia Home meds: None LDL 152, goal < 70 Put on crestor 20 Initiate statin at discharge  Diabetes type II Controlled Home meds: Exenatide 2 mg weekly HgbA1c pending, goal < 7.0 CBGs SSI Close PCP follow-up as outpatient.  Other Stroke Risk Factors Advanced Age >/= 68  Obesity, Body mass index is 41.52 kg/m., BMI >/= 30 associated with increased stroke risk, recommend weight loss, diet and exercise as appropriate   Other Active Problems Renal cell carcinoma status post left nephrectomy IBS Anemia AKI on CKD 2, creatinine 1.72  Hospital day # [place] , MSN, AGACNP-BC [facility] See Amion for schedule and pager information 01/28/2023 1:03 PM  ATTENDING NOTE: I reviewed above note and agree with the assessment and plan. Pt was seen and examined.   Daughter and RN and are at bedside.  Patient AOx3, no aphasia, no dysarthria, neuro intact except right-sided hemiparesthesia.  MRI shows stable left thalamic small ICH but also showed left frontoparietal white matter small infarcts.  Etiology for ICH and  ischemic infarcts most likely due to small vessel disease with uncontrolled risk factors.  Continue BP monitoring, BP goal less than 160.  Wean off Cleviprex as able, put on p.o. medication including Coreg and amlodipine.  Put on Crestor for hyperlipidemia.  Pending A1c, LDL, carotid Doppler and MRA.  PT/OT recommend home health.  For detailed assessment and plan, please refer to above/below as I have made changes wherever appropriate.   Rosalin Hawking, MD PhD Stroke Neurology 01/28/2023 4:33 PM    To contact Stroke Continuity provider, please refer to [email or web address] After hours, contact General Neurology

## 2023-01-28 NOTE — Procedures (Addendum)
Patient Name: [name]  MRN: [id number]  Epilepsy Attending: Lora Havens  Referring Physician/Provider: Rosalin Hawking, MD  Date: 01/28/2023 Duration: 23.33 mins  Patient history: 67yo F with ams. EEG to evaluate for seizure  Level of alertness: Awake, drowsy  AEDs during EEG study: Ativan  Technical aspects: This EEG study was done with scalp electrodes positioned according to the 10-20 International system of electrode placement. Electrical activity was reviewed with band pass filter of 1-70Hz , sensitivity of 7 uV/mm, display speed of 40mm/sec with a 60Hz  notched filter applied as appropriate. EEG data were recorded continuously and digitally stored.  Video monitoring was available and reviewed as appropriate.  Description: The posterior dominant rhythm consists of 9-10 Hz activity of moderate voltage (25-35 uV) seen predominantly in posterior head regions, symmetric and reactive to eye opening and eye closing. Drowsiness was characterized by attenuation of the posterior background rhythm. Hyperventilation and photic stimulation were not performed.     IMPRESSION: This study is within normal limits. No seizures or epileptiform discharges were seen throughout the recording.  A normal interictal EEG does not exclude  the diagnosis of epilepsy.  [name] [name]

## 2023-01-28 NOTE — Progress Notes (Signed)
EEG complete - results pending 

## 2023-01-28 NOTE — Progress Notes (Signed)
SLP Cancellation Note  Patient Details Name: [name] MRN: [id number] DOB: 1956/03/20   Cancelled treatment:       Reason Eval/Treat Not Completed: SLP screened, no needs identified, will sign off. Chatted with RN who was in agreement.   Hisako Bugh MA, CCC-SLP    Karri Kallenbach Meryl 01/28/2023, 10:08 AM

## 2023-01-28 NOTE — Progress Notes (Signed)
Echocardiogram 2D Echocardiogram has been performed.  [name] 01/28/2023, 10:28 AM

## 2023-01-28 NOTE — Progress Notes (Signed)
OT Cancellation Note  Patient Details Name: [name] MRN: [id number] DOB: [birth year]   Cancelled Treatment:    Reason Eval/Treat Not Completed: Active bedrest order (Will continue to monitor for appropriateness of OT eval.)  Jefferey Pica, OTR/L Acute Rehabilitation Services Office: [place] 01/28/2023, 7:28 AM

## 2023-01-28 NOTE — Progress Notes (Signed)
PT Cancellation Note  Patient Details Name: Erie Coors MRN: [id number] DOB: 01-Sep-1956   Cancelled Treatment:    Reason Eval/Treat Not Completed: Active bedrest order  Will monitor for incr activity order and proceed with evaluation when appropriate.   [place]  Office [phone]  Rexanne Mano 01/28/2023, 6:54 AM

## 2023-01-28 NOTE — Progress Notes (Signed)
At the beginning of the shift, patient was alert, sitting up in bed eating supper. Around 2030, this RN went to check on patient, but patient would not open eyes to any verbal stimuli. RN tried more noxious stimuli to arouse the patient. Pt would open eyes, but not sustain eye contact. RN tried to ask pt questions, but pt would not respond. Neuro MD paged twice. Patient had returned to baseline before MD could arrive at bedside.

## 2023-01-28 NOTE — Evaluation (Addendum)
Occupational Therapy Evaluation Patient Details Name: [name] MRN: [id number] DOB: 09/13/1956 Today's Date: 01/28/2023   History of Present Illness 67 y.o. 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  CT performed which demonstrates a small left intraparenchymal hematoma; SBP 200s;   Clinical Impression   Pt Living with grandson in 1st story apartment, no steps to get in. Pt has RW, but does not use it. Pt was independent PTA with ADL, mobility and iADL. Pt reports driving. Pt's vision has returned to "normal" and can read menu, phone and clock. Pt supervisionA to minA overall for ADL tasks. Pt supervisionA to minguardA for mobility in room. R hadn incoordination noted and numbness/tingling. Pt's cognition intact for recall after 1 min, 3 mins and 8 mins. Pt currently, fatigues easily. Pt would benefit from continued OT skilled services. OT following acutely.     Recommendations for follow up therapy are one component of a multi-disciplinary discharge planning process, led by the attending physician.  Recommendations may be updated based on patient status, additional functional criteria and insurance authorization.   Follow Up Recommendations  Home health OT     Assistance Recommended at Discharge Set up Supervision/Assistance  Patient can return home with the following A little help with walking and/or transfers;Assistance with cooking/housework;Assist for transportation    Functional Status Assessment  Patient has had a recent decline in their functional status and demonstrates the ability to make significant improvements in function in a reasonable and predictable amount of time.  Equipment Recommendations  BSC/3in1    Recommendations for Other Services       Precautions / Restrictions Precautions Precautions: Fall;Other (comment) Precaution Comments: SBP  130-150 Restrictions Weight Bearing Restrictions: No      Mobility Bed Mobility Overal bed mobility: Modified Independent             General bed mobility comments: including managing linens    Transfers Overall transfer level: Needs assistance Equipment used: 2 person hand held assist Transfers: Sit to/from Stand, Bed to chair/wheelchair/BSC Sit to Stand: Min assist, +2 physical assistance     Step pivot transfers: Min assist, +2 physical assistance     General transfer comment: pt reported feeling dizzy (BP 147/96); requested 2 person HHA for come to stand and to walk ~3 ft to chair      Balance Overall balance assessment: Needs assistance Sitting-balance support: No upper extremity supported, Feet supported Sitting balance-Leahy Scale: Good     Standing balance support: Bilateral upper extremity supported Standing balance-Leahy Scale: Poor                             ADL either performed or assessed with clinical judgement   ADL Overall ADL's : Needs assistance/impaired Eating/Feeding: Set up;Sitting Eating/Feeding Details (indicate cue type and reason): using L hand as R hand was incoordinated Grooming: Set up;Sitting   Upper Body Bathing: Minimal assistance;Sitting   Lower Body Bathing: Minimal assistance;Sitting/lateral leans;Sit to/from stand;Cueing for safety   Upper Body Dressing : Minimal assistance;Sitting   Lower Body Dressing: Minimal assistance;Sitting/lateral leans;Sit to/from stand   Toilet Transfer: Min guard;Cueing for Designer, television/film set and Hygiene: Min guard;Sitting/lateral lean;Bed level       Functional mobility during ADLs: Min guard;Cueing for safety;Cueing for sequencing       Vision Baseline Vision/History: 1 Wears glasses Ability to See in Adequate  Light: 0 Adequate Patient Visual Report: No change from baseline Vision Assessment?: Yes Eye Alignment: Within Functional Limits Ocular  Range of Motion: Within Functional Limits     Perception     Praxis      Pertinent Vitals/Pain Pain Assessment Pain Assessment: No/denies pain     Hand Dominance Right   Extremity/Trunk Assessment Upper Extremity Assessment Upper Extremity Assessment: RUE deficits/detail;LUE deficits/detail RUE Deficits / Details: strength 3/5; some incoordination noted; ataxic finger to nose and finger to thumb opposition LUE Deficits / Details: strength 4/5, AROM is WFLs   Lower Extremity Assessment Lower Extremity Assessment: Defer to PT evaluation   Cervical / Trunk Assessment Cervical / Trunk Assessment: Other exceptions Cervical / Trunk Exceptions: overweight   Communication Communication Communication: No difficulties   Cognition Arousal/Alertness: Awake/alert Behavior During Therapy: WFL for tasks assessed/performed Overall Cognitive Status: Within Functional Limits for tasks assessed                                 General Comments: Passed Short Blessed Test for recall and problem solving.     General Comments  BP with dizziness: 142/82 in sitting after exertion    Exercises     Shoulder Instructions      Home Living Family/patient expects to be discharged to:: Private residence Living Arrangements: Other relatives (grandson) Available Help at Discharge: Family;Available PRN/intermittently Type of Home: Apartment Home Access: Level entry     Home Layout: One level     Bathroom Shower/Tub: Teacher, early years/pre: Standard     Home Equipment: Conservation officer, nature (2 wheels);BSC/3in1;Adaptive equipment Adaptive Equipment: Reacher        Prior Functioning/Environment Prior Level of Function : Independent/Modified Independent;Driving             Mobility Comments: independent ADLs Comments: stands in shower now (used to use BSC in tub to sit); does housework, goes shopping        OT Problem List: Decreased activity  tolerance;Decreased range of motion;Impaired balance (sitting and/or standing);Decreased coordination;Impaired UE functional use;Increased edema;Obesity      OT Treatment/Interventions: Self-care/ADL training;Therapeutic exercise;Neuromuscular education;Energy conservation;DME and/or AE instruction;Therapeutic activities;Patient/family education;Balance training    OT Goals(Current goals can be found in the care plan section) Acute Rehab OT Goals Patient Stated Goal: to go home when ready OT Goal Formulation: With patient Time For Goal Achievement: 02/11/23 Potential to Achieve Goals: Good ADL Goals Additional ADL Goal #1: Pt will increase to supervisionA overall for OOB ADL. Additional ADL Goal #2: Pt will state 3 energy conservation techniques to assist in increasing activity tolerance.  OT Frequency: Min 2X/week    Co-evaluation PT/OT/SLP Co-Evaluation/Treatment: Yes Reason for Co-Treatment: For patient/therapist safety;To address functional/ADL transfers PT goals addressed during session: Mobility/safety with mobility;Balance OT goals addressed during session: ADL's and self-care      AM-PAC OT "6 Clicks" Daily Activity     Outcome Measure Help from another person eating meals?: None Help from another person taking care of personal grooming?: A Little Help from another person toileting, which includes using toliet, bedpan, or urinal?: A Little Help from another person bathing (including washing, rinsing, drying)?: A Little Help from another person to put on and taking off regular upper body clothing?: A Little Help from another person to put on and taking off regular lower body clothing?: A Little 6 Click Score: 19   End of Session Equipment Utilized During Treatment: Gait belt  Nurse Communication: Mobility status  Activity Tolerance: Patient tolerated treatment well Patient left: in chair;with call bell/phone within reach;with family/visitor present;with chair alarm set  OT  Visit Diagnosis: Unsteadiness on feet (R26.81);Muscle weakness (generalized) (M62.81)                Time: 1100-1130 OT Time Calculation (min): 30 min Charges:  OT General Charges $OT Visit: 1 Visit OT Evaluation $OT Eval Moderate Complexity: 1 Mod Jefferey Pica, OTR/L Acute Rehabilitation Services Office: [phone] 01/28/2023, 1:45 PM

## 2023-01-29 DIAGNOSIS — I161 Hypertensive emergency: Secondary | ICD-10-CM | POA: Diagnosis not present

## 2023-01-29 DIAGNOSIS — I610 Nontraumatic intracerebral hemorrhage in hemisphere, subcortical: Secondary | ICD-10-CM | POA: Diagnosis not present

## 2023-01-29 LAB — LIPID PANEL
Cholesterol: 242 mg/dL — ABNORMAL HIGH (ref 0–200)
HDL: 48 mg/dL (ref >40)
LDL Cholesterol: 171 mg/dL — ABNORMAL HIGH (ref 0–99)
Total CHOL/HDL Ratio: 5 RATIO
Triglycerides: 115 mg/dL (ref <150)
VLDL: 23 mg/dL (ref 0–40)

## 2023-01-29 LAB — GLUCOSE, CAPILLARY
Glucose-Capillary: 106 mg/dL — ABNORMAL HIGH (ref 70–99)
Glucose-Capillary: 123 mg/dL — ABNORMAL HIGH (ref 70–99)
Glucose-Capillary: 128 mg/dL — ABNORMAL HIGH (ref 70–99)
Glucose-Capillary: 184 mg/dL — ABNORMAL HIGH (ref 70–99)
Glucose-Capillary: 83 mg/dL (ref 70–99)
Glucose-Capillary: 88 mg/dL (ref 70–99)

## 2023-01-29 LAB — CBC
HCT: 39 % (ref 36.0–46.0)
Hemoglobin: 12 g/dL (ref 12.0–15.0)
MCH: 27.8 pg (ref 26.0–34.0)
MCHC: 30.8 g/dL (ref 30.0–36.0)
MCV: 90.5 fL (ref 80.0–100.0)
Platelets: 226 10*3/uL (ref 150–400)
RBC: 4.31 MIL/uL (ref 3.87–5.11)
RDW: 14.2 % (ref 11.5–15.5)
WBC: 6.7 10*3/uL (ref 4.0–10.5)
nRBC: 0 % (ref 0.0–0.2)

## 2023-01-29 LAB — BASIC METABOLIC PANEL
Anion gap: 7 (ref 5–15)
BUN: 32 mg/dL — ABNORMAL HIGH (ref 8–23)
CO2: 24 mmol/L (ref 22–32)
Calcium: 8.5 mg/dL — ABNORMAL LOW (ref 8.9–10.3)
Chloride: 107 mmol/L (ref 98–111)
Creatinine, Ser: 2.1 mg/dL — ABNORMAL HIGH (ref 0.44–1.00)
GFR, Estimated: 26 mL/min — ABNORMAL LOW (ref >60)
Glucose, Bld: 113 mg/dL — ABNORMAL HIGH (ref 70–99)
Potassium: 4.1 mmol/L (ref 3.5–5.1)
Sodium: 138 mmol/L (ref 135–145)

## 2023-01-29 MED ORDER — SODIUM CHLORIDE 0.9 % IV SOLN: INTRAVENOUS | Status: DC

## 2023-01-29 NOTE — Progress Notes (Signed)
Physical Therapy Treatment Patient Details Name: [name] MRN: [id number] DOB: [birth year] Today's Date: 01/29/2023   History of Present Illness 67 y.o. 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  CT performed which demonstrates a small left intraparenchymal hematoma; SBP 200s;    PT Comments    Patient with worse balance today as ambulation progressed. Patient initially refused use of RW, however after requiring up to mod assist to maintain balance, she agreed she needs to use RW. Grandson lives with pt, but only available intermittently therefore pt needs to be much more independent prior to discharge home. Discussed with Dr. Erlinda Hong. Will update discharge plan to [facility].     Recommendations for follow up therapy are one component of a multi-disciplinary discharge planning process, led by the attending physician.  Recommendations may be updated based on patient status, additional functional criteria and insurance authorization.  Follow Up Recommendations  Acute inpatient rehab (3hours/day)     Assistance Recommended at Discharge Intermittent Supervision/Assistance  Patient can return home with the following Assistance with cooking/housework;Assist for transportation;A lot of help with walking and/or transfers   Equipment Recommendations  None recommended by PT    Recommendations for Other Services Rehab consult     Precautions / Restrictions Precautions Precautions: Fall;Other (comment) Precaution Comments: SBP 130-150 Restrictions Weight Bearing Restrictions: No     Mobility  Bed Mobility Overal bed mobility: Modified Independent             General bed mobility comments: including managing linens    Transfers Overall transfer level: Needs assistance Equipment used: 2 person hand held assist Transfers: Sit to/from Stand Sit to Stand: Min assist, +2 physical assistance            General transfer comment: posterior imbalance when coming to stand with assist to recover    Ambulation/Gait Ambulation/Gait assistance: +2 physical assistance, Mod assist Gait Distance (Feet): 100 Feet Assistive device: 2 person hand held assist, 1 person hand held assist Gait Pattern/deviations: Step-to pattern, Decreased stride length, Shuffle, Step-through pattern, Leaning posteriorly, Wide base of support   Gait velocity interpretation: 1.31 - 2.62 ft/sec, indicative of limited community ambulator   General Gait Details: Pt with incr lateral excursion and incr support via bil UE progressing to just LUE; initially did not want to use RW however by end of walk able to acknowledge that she should use. RN also educated.   Stairs             Wheelchair Mobility    Modified Rankin (Stroke Patients Only) Modified Rankin (Stroke Patients Only) Pre-Morbid Rankin Score: Slight disability Modified Rankin: Moderately severe disability     Balance Overall balance assessment: Needs assistance Sitting-balance support: No upper extremity supported, Feet supported Sitting balance-Leahy Scale: Good     Standing balance support: Bilateral upper extremity supported Standing balance-Leahy Scale: Poor Standing balance comment: posterior imbalance in static standing                            Cognition Arousal/Alertness: Awake/alert Behavior During Therapy: WFL for tasks assessed/performed Overall Cognitive Status: Within Functional Limits for tasks assessed                                          Exercises  General Comments        Pertinent Vitals/Pain Pain Assessment Pain Assessment: No/denies pain    Home Living                          Prior Function            PT Goals (current goals can now be found in the care plan section) Acute Rehab PT Goals Patient Stated Goal: be able to return home Time For Goal  Achievement: 02/11/23 Potential to Achieve Goals: Good Progress towards PT goals: Progressing toward goals    Frequency    Min 4X/week      PT Plan Discharge plan needs to be updated    Co-evaluation              AM-PAC PT "6 Clicks" Mobility   Outcome Measure  Help needed turning from your back to your side while in a flat bed without using bedrails?: None Help needed moving from lying on your back to sitting on the side of a flat bed without using bedrails?: None Help needed moving to and from a bed to a chair (including a wheelchair)?: Total Help needed standing up from a chair using your arms (e.g., wheelchair or bedside chair)?: Total Help needed to walk in hospital room?: Total Help needed climbing 3-5 steps with a railing? : Total 6 Click Score: 12    End of Session Equipment Utilized During Treatment: Gait belt Activity Tolerance: Patient tolerated treatment well Patient left: in chair;with call bell/phone within reach;with chair alarm set Nurse Communication: Mobility status PT Visit Diagnosis: Unsteadiness on feet (R26.81);Difficulty in walking, not elsewhere classified (R26.2)     Time: [id number] PT Time Calculation (min) (ACUTE ONLY): 19 min  Charges:  $Gait Training: 8-22 mins                      [facility]  Office [phone]    Rexanne Mano 01/29/2023, 10:48 AM

## 2023-01-29 NOTE — Progress Notes (Addendum)
STROKE TEAM PROGRESS NOTE   INTERVAL HISTORY Patient is seen in her room with 1 family member at the bedside.  Patient is sitting in the chair in no apparent distress.  RN at bedside.  This a.m. patient was very unsteady on her feet while working with physical therapy and getting into the chair we will hold off on discharge home today and reevaluate in the morning will transfer patient to the floor. Labs this a.m. creatinine is elevated at 2.1 will encourage fluids LDL is 171 Neurological exam is unchanged.  Vitals are stable  Vitals:   01/29/23 0500 01/29/23 0600 01/29/23 0700 01/29/23 0800  BP: (!) 145/70 (!) 147/67 126/63 130/66  Pulse: 89 86 72 80  Resp: 15 16 17 16   Temp:    97.7 F (36.5 C)  TempSrc:    Oral  SpO2: 94% 96% 97% 98%  Weight:      Height:       CBC:  Recent Labs  Lab 01/27/23 1647 01/29/23 0323  WBC 7.7 6.7  NEUTROABS 4.1  --   HGB 13.7 12.0  HCT 43.7 39.0  MCV 92.2 90.5  PLT 235 [id number]    Basic Metabolic Panel:  Recent Labs  Lab 01/27/23 1647 01/29/23 0323  NA 138 138  K 4.1 4.1  CL 104 107  CO2 24 24  GLUCOSE 187* 113*  BUN 23 32*  CREATININE 1.72* 2.10*  CALCIUM 8.4* 8.5*    Lipid Panel:  Recent Labs  Lab 01/29/23 0323  CHOL 242*  TRIG 115  HDL 48  CHOLHDL 5.0  VLDL 23  LDLCALC 171*   HgbA1c: No results for input(s): "HGBA1C" in the last 168 hours. Urine Drug Screen:  Recent Labs  Lab 01/28/23 1841  LABOPIA NONE DETECTED  COCAINSCRNUR NONE DETECTED  LABBENZ NONE DETECTED  AMPHETMU NONE DETECTED  THCU NONE DETECTED  LABBARB NONE DETECTED    Alcohol Level No results for input(s): "ETH" in the last 168 hours.  IMAGING past 24 hours VAS US CAROTID  Result Date: 01/29/2023 Carotid Arterial Duplex Study Patient Name:  [name]  Date of Exam:   01/28/2023 Medical Rec #: [id number]           Accession #:    [id number] Date of Birth: Mar 26, 1956          Patient Gender: F Patient Age:   42 years Exam Location:  [facility] Procedure:      VAS US CAROTID Referring Phys: Elwin Sleight DE LA TORRE --------------------------------------------------------------------------------  Indications:       CVA and ICH. Risk Factors:      Hypertension, hyperlipidemia, Diabetes. Limitations        Today's exam was limited due to the body habitus of the                    patient and somnolent. Comparison Study:  Prior normal carotid duplex done 01/06/18 Performing Technologist: Sharion Dove RVS  Examination Guidelines: A complete evaluation includes B-mode imaging, spectral Doppler, color Doppler, and power Doppler as needed of all accessible portions of each vessel. Bilateral testing is considered an integral part of a complete examination. Limited examinations for reoccurring indications may be performed as noted.  Right Carotid Findings: +----------+--------+--------+--------+------------------+------------------+           PSV cm/sEDV cm/sStenosisPlaque DescriptionComments           +----------+--------+--------+--------+------------------+------------------+ CCA Prox  79      8  intimal thickening +----------+--------+--------+--------+------------------+------------------+ CCA Distal85      13                                intimal thickening +----------+--------+--------+--------+------------------+------------------+ ICA Prox  58      10                                                   +----------+--------+--------+--------+------------------+------------------+ ICA Mid   69      14                                                   +----------+--------+--------+--------+------------------+------------------+ ICA Distal49      12                                                   +----------+--------+--------+--------+------------------+------------------+ ECA       100     12                                                    +----------+--------+--------+--------+------------------+------------------+ +----------+--------+-------+--------+-------------------+           PSV cm/sEDV cmsDescribeArm Pressure (mmHG) +----------+--------+-------+--------+-------------------+ [id number]                                         +----------+--------+-------+--------+-------------------+ +---------+--------+--+--------+--+ VertebralPSV cm/s50EDV cm/s14 +---------+--------+--+--------+--+  Left Carotid Findings: +----------+--------+--------+--------+------------------+------------------+           PSV cm/sEDV cm/sStenosisPlaque DescriptionComments           +----------+--------+--------+--------+------------------+------------------+ CCA Prox  78      9                                 intimal thickening +----------+--------+--------+--------+------------------+------------------+ CCA Distal70      10                                intimal thickening +----------+--------+--------+--------+------------------+------------------+ ICA Prox  51      11                                                   +----------+--------+--------+--------+------------------+------------------+ ICA Mid   55      12                                                   +----------+--------+--------+--------+------------------+------------------+ ICA Distal44  14                                                   +----------+--------+--------+--------+------------------+------------------+ ECA       66      1                                                    +----------+--------+--------+--------+------------------+------------------+ +----------+--------+--------+--------+-------------------+           PSV cm/sEDV cm/sDescribeArm Pressure (mmHG) +----------+--------+--------+--------+-------------------+ [id number]                                          +----------+--------+--------+--------+-------------------+ +---------+--------+--+--------+--+ VertebralPSV cm/s58EDV cm/s10 +---------+--------+--+--------+--+   Summary: Right Carotid: The extracranial vessels were near-normal with only minimal wall                thickening or plaque. Left Carotid: The extracranial vessels were near-normal with only minimal wall               thickening or plaque. Vertebrals:  Bilateral vertebral arteries demonstrate antegrade flow. Subclavians: Normal flow hemodynamics were seen in bilateral subclavian              arteries. *See table(s) above for measurements and observations.  Electronically signed by Antony Contras MD on 01/29/2023 at 9:08:55 AM.    Final    EEG adult  Result Date: 01/28/2023 Lora Havens, MD     01/28/2023  6:05 PM Patient Name: [name] MRN: [id number] Epilepsy Attending: Lora Havens Referring Physician/Provider: Rosalin Hawking, MD Date: 01/28/2023 Duration: 23.33 mins Patient history: 67yo F with ams. EEG to evaluate for seizure Level of alertness: Awake, drowsy AEDs during EEG study: Ativan Technical aspects: This EEG study was done with scalp electrodes positioned according to the 10-20 International system of electrode placement. Electrical activity was reviewed with band pass filter of 1-70Hz , sensitivity of 7 uV/mm, display speed of 33mm/sec with a 60Hz  notched filter applied as appropriate. EEG data were recorded continuously and digitally stored.  Video monitoring was available and reviewed as appropriate. Description: The posterior dominant rhythm consists of 9-10 Hz activity of moderate voltage (25-35 uV) seen predominantly in posterior head regions, symmetric and reactive to eye opening and eye closing. Drowsiness was characterized by attenuation of the posterior background rhythm. Hyperventilation and photic stimulation were not performed.   IMPRESSION: This study is within normal limits. No seizures or epileptiform  discharges were seen throughout the recording. A normal interictal EEG does not exclude  the diagnosis of epilepsy. Lora Havens   MR ANGIO HEAD WO CONTRAST  Result Date: 01/28/2023 CLINICAL DATA:  Provided history: Stroke, hemorrhagic. EXAM: MRA HEAD WITHOUT CONTRAST TECHNIQUE: Angiographic images of the Circle of Willis were acquired using MRA technique without intravenous contrast. COMPARISON:  MRA head 05/19/2021. FINDINGS: Anterior circulation: The intracranial internal carotid arteries are patent. The M1 middle cerebral arteries are patent. Severe stenosis within a mid to distal right M2 MCA vessel (at site of prior occlusion )(series 355, image 18). Progressive severe long segment stenosis within a proximal M2 left MCA vessel (series 355,  image 6) (series 356, image 135). As before, the right anterior cerebral artery is diminutive beyond the proximal right A2 segment. Mild atherosclerotic narrowing of the right A1 segment, unchanged. 2 mm inferiorly projecting vascular protrusion arising from the cavernous left internal carotid artery (series 355, image 15). This was not definitively present on the prior MRA head of 05/19/2021. Posterior circulation: The intracranial vertebral arteries are patent. The basilar artery is patent. The posterior cerebral arteries are patent. Progressive moderate/severe stenosis within the left PCA P2 segment. Posterior communicating arteries are present bilaterally. Anatomic variants: As described. Impressions #3 will be called to the ordering clinician or representative by the Radiologist Assistant, and communication documented in the PACS or [employer]. IMPRESSION: 1. No intracranial large vessel occlusion is identified. 2. Intracranial atherosclerotic disease with multifocal stenoses, most notably as follows. 3. Progressive severe long segment stenosis within a proximal M2 left middle cerebral artery vessel. 4. Severe stenosis within a mid-to-distal right M2  middle cerebral artery vessel (at site of prior occlusion). 5. Progressive moderate/severe stenosis within the left posterior cerebral artery P2 segment. 6. 2 mm inferiorly projecting vascular protrusion arising from the cavernous left internal carotid artery. This may reflect an aneurysm or the origin of an otherwise poorly delineated branch vessel. Electronically Signed   By: Kellie Simmering D.O.   On: 01/28/2023 17:59   CT HEAD WO CONTRAST (5MM)  Result Date: 01/28/2023 CLINICAL DATA:  Provided history: Stroke, hemorrhagic. EXAM: CT HEAD WITHOUT CONTRAST TECHNIQUE: Contiguous axial images were obtained from the base of the skull through the vertex without intravenous contrast. RADIATION DOSE REDUCTION: This exam was performed according to the departmental dose-optimization program which includes automated exposure control, adjustment of the mA and/or kV according to patient size and/or use of iterative reconstruction technique. COMPARISON:  Brain MRI performed earlier today 01/28/2023. Prior head CT examinations 01/27/2023 and earlier. FINDINGS: Brain: No age advanced or lobar predominant parenchymal atrophy. The acute parenchymal hemorrhage within the left thalamus measures 1.1 x 0.8 x 0.9 cm on the current exam (< 1 mL). This is slightly decreased in size from the prior head CT of 01/27/2023 (previously measuring 1.2 x 0.9 x 0.8 cm). Mild surrounding edema is unchanged. Multiple small subacute infarcts within the left frontoparietal white matter were better appreciated on the brain MRI performed earlier today. Chronic lacunar infarcts within the right corona radiata and bilateral deep gray nuclei. Background patchy and ill-defined hypoattenuation within the cerebral white matter, nonspecific but compatible chronic small vessel ischemic disease (moderate to severe by MRI performed earlier today). Redemonstrated chronic infarct within the right basal ganglia/insula. Associated chronic blood products at this site  on MRI brain performed earlier today. Partially empty sella turcica. No extra-axial fluid collection. No evidence of an intracranial mass. No midline shift. Vascular: No hyperdense vessel. Atherosclerotic calcifications. Skull: No fracture or aggressive osseous lesion. Sinuses/Orbits: No mass or acute finding within the imaged orbits. No significant paranasal sinus disease at the imaged levels. IMPRESSION: 1. Known small (< 1 mL) acute parenchymal hemorrhage within the left thalamus, slightly decreased in size from the head CT of 01/27/2023. Mild surrounding edema is unchanged. 2. Multiple small subacute infarcts within the left frontoparietal white matter were better appreciated on the brain MRI performed earlier today. 3. Background chronic small vessel disease and chronic lacunar infarcts as described. Electronically Signed   By: Kellie Simmering D.O.   On: 01/28/2023 17:45   MR BRAIN WO CONTRAST  Result Date: 01/28/2023 CLINICAL DATA:  Provided history:  Stroke, follow-up. EXAM: MRI HEAD WITHOUT CONTRAST TECHNIQUE: Multiplanar, multiecho pulse sequences of the brain and surrounding structures were obtained without intravenous contrast. COMPARISON:  Head CT 01/27/2023.  Brain MRI 05/19/2021. FINDINGS: Intermittently motion degraded exam. Brain: No age advanced or lobar predominant parenchymal atrophy. Small acute hemorrhage within the left thalamus, grossly unchanged from the head CT of 01/27/2023. Multiple small subacute infarcts within the left frontoparietal white matter (MCA vascular territory) measuring up to 9 mm. Chronic lacunar infarcts within the right corona radiata and bilateral deep gray nuclei. Chronic hemorrhages within the bilateral deep gray nuclei and within the brainstem. A chronic hemorrhage within the left thalamus is new from the prior brain MRI of 05/19/2021. Multifocal T2 FLAIR hyperintense signal abnormality within the cerebral white matter, nonspecific but compatible with moderate to  advanced chronic small vessel ischemic disease. Moderate chronic small vessel ischemic changes within the pons, including a tiny chronic lacunar infarct centrally. Tiny chronic infarct within the right cerebellar hemisphere. Partially empty sella turcica. No evidence of an intracranial mass. No extra-axial fluid collection. No midline shift. Vascular: Maintained flow voids within the proximal large arterial vessels. Skull and upper cervical spine: No focal suspicious marrow lesion. Incompletely assessed cervical spondylosis. Sinuses/Orbits: No mass or acute finding within the imaged orbits. No significant paranasal sinus disease. Impressions #1 and #2 will be called to the ordering clinician or representative by the Radiologist Assistant, and communication documented in the PACS or [employer]. IMPRESSION: 1. Small acute parenchymal hemorrhage within the left thalamus, unchanged in size from the head CT performed 01/27/2023. 2. Small subacute infarcts within the left frontoparietal white matter, measuring up to 9 mm. 3. Background chronic small vessel ischemic disease, chronic infarcts and chronic hemorrhages as described Electronically Signed   By: Kellie Simmering D.O.   On: 01/28/2023 15:04    PHYSICAL EXAM General: Alert, well-nourished, well-developed patient in no acute distress Respiratory: Regular, unlabored respirations on room air  NEURO:  Mental Status: AA&Ox3  Speech/Language: speech is without dysarthria or aphasia.   Cranial Nerves:  II: PERRL. Visual fields full.  III, IV, VI: EOMI. Eyelids elevate symmetrically.  V: Sensation is intact to light touch but diminished on right side of face VII: Smile is symmetrical.  VIII: hearing intact to voice. IX, X:Phonation is normal.  [id number] shrug 5/5. XII: tongue is midline without fasciculations. Motor: 5/5 strength to all muscle groups tested.  Tone: is normal and bulk is normal Sensation- Intact to light touch bilaterally but  diminished in left arm, symmetrical in legs Coordination: FTN intact bilaterally.No drift.  Gait- deferred    ASSESSMENT/PLAN [name] is a 67 y.o. 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  She also had a transient episode of diplopia.  The tingling improved, but the numbness was persistent so she sought care in the ED and was found to have a small left thalamic IPH.  She was noted to be quite hypertensive on arrival with systolics in the [id number], but this has improved.  ICH score 0  ICH:  left thalamic ICH, likely Hypertension Stroke: left frontoparietal WM infarcts, likely small vessel disease CT head small IPH in left thalamus MRI stable small left thalamic IPH, small subacute ischemic infarcts in left frontoparietal white matter MRA no LVO.  Cranial atherosclerosis small M2 severe long segment stenosis.  Severe stenosis within right M2 progressive to moderate to severe stenosis of left  posterior P2 2 mm inferiorly projecting vascular protrusion arising from the cavernous left internal carotid artery may reflect an aneurysm Carotid Doppler negative 2D Echo 60 to 65%  LDL 171 HgbA1c pending UDS negative VTE prophylaxis -SCDs No antithrombotic prior to admission, now on No antithrombotic secondary to IPH.  Consider restarting 81 mg aspirin in 2 weeks after repeat CT head Therapy recommendations: CIR Disposition: Pending  History of ICH and stroke 12/2017 admitted for fall with left-sided weakness.  CT showed right BG ICH.  CT head and neck unremarkable.  EF 60 to 65%.  Carotid Doppler negative.  UDS negative.  LDL 118.  With residual left-sided numbness 08/2019 follow-up with [facility], put on aspirin 81. 05/2021 admitted for right thalamic infarct.  MRI showed right left M2 occlusion and left M2 stenosis.  EF 60 to 65%, LDL 152, A1c 7.2, discharged on DAPT and Lipitor  80.  Hypertension Home meds: coreg 6.25 mg daily, lisinopril-hydrochlorothiazide 20-12.5 mg daily Wean off Cleviprex if able Resume home coreg Start amlodipine 10 mg daily Hold off ACEI and diuretics due to elevated Cre BP goal less than 160 Long-term BP goal normotensive  Hyperlipidemia Home meds: None LDL 152, goal < 70 Put on crestor 20 Initiate statin at discharge  Diabetes type II Controlled Home meds: Exenatide 2 mg weekly HgbA1c pending, goal < 7.0 CBGs SSI Close PCP follow-up as outpatient.  AKI on CKD 3a creatinine 1.72->2.10 Encourage po intake Add IVF @ 75 Monitor BMP in am  Other Stroke Risk Factors Advanced Age >/= 62  Obesity, Body mass index is 41.52 kg/m., BMI >/= 30 associated with increased stroke risk, recommend weight loss, diet and exercise as appropriate   Other Active Problems Renal cell carcinoma status post left nephrectomy IBS  Hospital day # 2  Beulah Gandy DNP, ACNPC-AG  [facility]  ATTENDING NOTE: I reviewed above note and agree with the assessment and plan. Pt was seen and examined.   Daughter at the bedside, pt sitting in chair, slight lethargic. Per RN, pt did not get good sleep last night and was drowsy this am. She worked with PT/OT and seems not as steady as yesterday, currently recommend CIR.   Pt yesterday afternoon had episode of non verbal, but tracking and following commands. CT repeat no change and EEG no seizure. Pt again had non-responding episode last night, lasted 30 min and resolved suddenly. Episodes concerning for functional component. Will continue to monitor.   Cre worsened, encourage po intake and put on IVF. Close monitor BMP. BP stable, off cleviprex.   For detailed assessment and plan, please refer to above/below as I have made changes wherever appropriate.   Rosalin Hawking, MD PhD Stroke Neurology 01/29/2023 12:27 PM  This patient is critically ill due to [place], hypertensive emergency, AKI and at  significant risk of neurological worsening, death form hematoma expansion, cerebral edema, renal failure, encephalopathy. This patient's care requires constant monitoring of vital signs, hemodynamics, respiratory and cardiac monitoring, review of multiple databases, neurological assessment, discussion with family, other specialists and medical decision making of high complexity. I spent 35 minutes of neurocritical care time in the care of this patient.     To contact Stroke Continuity provider, please refer to [email or web address] After hours, contact General Neurology

## 2023-01-29 NOTE — Progress Notes (Signed)
Inpatient Rehab Admissions Coordinator:  ? ?Per therapy recommendations,  patient was screened for CIR candidacy by Nesta Scaturro, MS, CCC-SLP. At this time, Pt. Appears to be a a potential candidate for CIR. I will place   order for rehab consult per protocol for full assessment. Please contact me any with questions. ? ?Jelan Batterton, MS, CCC-SLP ?Rehab Admissions Coordinator  ?[phone] (celll) ?[phone] (office) ? ?

## 2023-01-30 ENCOUNTER — Inpatient Hospital Stay (HOSPITAL_COMMUNITY): Payer: Medicare Other

## 2023-01-30 ENCOUNTER — Encounter (HOSPITAL_COMMUNITY): Payer: Medicare Other

## 2023-01-30 DIAGNOSIS — I10 Essential (primary) hypertension: Secondary | ICD-10-CM

## 2023-01-30 DIAGNOSIS — M109 Gout, unspecified: Secondary | ICD-10-CM

## 2023-01-30 DIAGNOSIS — N179 Acute kidney failure, unspecified: Secondary | ICD-10-CM

## 2023-01-30 DIAGNOSIS — R200 Anesthesia of skin: Secondary | ICD-10-CM | POA: Diagnosis not present

## 2023-01-30 DIAGNOSIS — I610 Nontraumatic intracerebral hemorrhage in hemisphere, subcortical: Secondary | ICD-10-CM

## 2023-01-30 DIAGNOSIS — M25572 Pain in left ankle and joints of left foot: Secondary | ICD-10-CM

## 2023-01-30 DIAGNOSIS — N1831 Chronic kidney disease, stage 3a: Secondary | ICD-10-CM

## 2023-01-30 HISTORY — DX: Gout, unspecified: M10.9

## 2023-01-30 LAB — GLUCOSE, CAPILLARY
Glucose-Capillary: 110 mg/dL — ABNORMAL HIGH (ref 70–99)
Glucose-Capillary: 83 mg/dL (ref 70–99)
Glucose-Capillary: 121 mg/dL — ABNORMAL HIGH (ref 70–99)
Glucose-Capillary: 192 mg/dL — ABNORMAL HIGH (ref 70–99)
Glucose-Capillary: 206 mg/dL — ABNORMAL HIGH (ref 70–99)
Glucose-Capillary: 208 mg/dL — ABNORMAL HIGH (ref 70–99)

## 2023-01-30 LAB — HEMOGLOBIN A1C
Hgb A1c MFr Bld: 7.1 % — ABNORMAL HIGH (ref 4.8–5.6)
Mean Plasma Glucose: 157 mg/dL

## 2023-01-30 LAB — BASIC METABOLIC PANEL
Anion gap: 7 (ref 5–15)
BUN: 32 mg/dL — ABNORMAL HIGH (ref 8–23)
CO2: 24 mmol/L (ref 22–32)
Calcium: 8.4 mg/dL — ABNORMAL LOW (ref 8.9–10.3)
Chloride: 104 mmol/L (ref 98–111)
Creatinine, Ser: 1.78 mg/dL — ABNORMAL HIGH (ref 0.44–1.00)
GFR, Estimated: 31 mL/min — ABNORMAL LOW (ref >60)
Glucose, Bld: 105 mg/dL — ABNORMAL HIGH (ref 70–99)
Potassium: 4 mmol/L (ref 3.5–5.1)
Sodium: 135 mmol/L (ref 135–145)

## 2023-01-30 MED ORDER — CEFAZOLIN SODIUM-DEXTROSE 1-4 GM/50ML-% IV SOLN
1.0000 g | Freq: Three times a day (TID) | INTRAVENOUS | Status: DC
  Administered 2023-01-30 – 2023-01-31 (×4): 1 g via INTRAVENOUS
  Filled 2023-01-30 (×6): qty 50

## 2023-01-30 MED ORDER — PREDNISONE 20 MG PO TABS
40.0000 mg | ORAL_TABLET | Freq: Every day | ORAL | Status: DC
  Administered 2023-01-30 – 2023-01-31 (×2): 40 mg via ORAL
  Filled 2023-01-30 (×2): qty 2

## 2023-01-30 MED ORDER — ALLOPURINOL 100 MG PO TABS: 50.0000 mg | ORAL_TABLET | Freq: Every day | ORAL | Status: DC

## 2023-01-30 MED ORDER — COLCHICINE 0.6 MG PO TABS
0.6000 mg | ORAL_TABLET | Freq: Every day | ORAL | Status: DC
  Administered 2023-01-30 – 2023-01-31 (×2): 0.6 mg via ORAL
  Filled 2023-01-30 (×3): qty 1

## 2023-01-30 NOTE — Progress Notes (Addendum)
Inpatient Rehab Coordinator Note:  Addendum: we will need updated OT note recommending CIR prior to sending for insurance authorization. Current OT note recommending HHOT.  I met with patient at bedside to discuss CIR recommendations and goals/expectations of CIR stay.  We reviewed 3 hrs/day of therapy, physician follow up, and average length of stay 2 weeks (dependent upon progress) with goals of mod I. Patient reports her grandson lives with her and he works during the day. Patient is interested in CIR. Medical director is planning to perform consult. Will continue to follow for potential CIR admission pending insurance approval.   Rehab Admissons Coordinator [place], [place], [facility] [phone]

## 2023-01-30 NOTE — Consult Note (Addendum)
Reason for Consult:Left foot pain Referring Physician: Rosalin Hawking Time called: 1108 Time at bedside: [place] is an 67 y.o. female.  HPI: [name] was admitted with CVA 2d ago. She's been recovering well but this morning woke up with left foot pain. She been unable to bear weight on it. She denies any antecedent event. She's had symptoms like this before but has never seen anyone about it; they've improved on their own. She has been told by her PCP that she's had gout before.  Past Medical History:  Diagnosis Date   Anemia    Blood transfusion 2010   associated with GI bleeding   CKD (chronic kidney disease) stage 2, GFR 60-89 ml/min    Depression    Diabetes mellitus    Diverticulosis of colon with hemorrhage 2010, 09/2011   H/O hiatal hernia    Helicobacter pylori gastritis 09/13/2013   Hx: UTI (urinary tract infection)    Hypertension    Personal history of colonic polyps    adenomas since 2010   Renal cell carcinoma 2004   lt kidney removed   Stroke [facility]) 12/2017   05/2021    Past Surgical History:  Procedure Laterality Date   ABDOMINAL HYSTERECTOMY  2004   BIOPSY  10/23/2019   Procedure: BIOPSY;  Surgeon: Lavena Bullion, DO;  Location: [place] [facility];  Service: Gastroenterology;;   CHOLECYSTECTOMY     COLONOSCOPY  12/19/2008   diverticulosis   COLONOSCOPY  11/17/2011   Procedure: COLONOSCOPY;  Surgeon: Zenovia Jarred, MD;  Location: [facility];  Service: Gastroenterology;  Laterality: N/A;   DILATION AND CURETTAGE OF UTERUS N/A 07/11/2014   Procedure: Repair of Vaginal Cuff;  Surgeon: Melina Schools, MD;  Location: [place] [facility];  Service: Gynecology;  Laterality: N/A;   ESOPHAGOGASTRODUODENOSCOPY (EGD) WITH PROPOFOL N/A 10/23/2019   Procedure: ESOPHAGOGASTRODUODENOSCOPY (EGD) WITH PROPOFOL;  Surgeon: Lavena Bullion, DO;  Location: [place];  Service: Gastroenterology;  Laterality: N/A;   FLEXIBLE SIGMOIDOSCOPY  12/12/2008   diverticulosis   HAND  SURGERY     x5 right   HYDRADENITIS EXCISION Right 03/21/2014   Procedure: EXCISION HIDRADENITIS AXILLA;  Surgeon: Pedro Earls, MD;  Location: [place];  Service: General;  Laterality: Right;   KNEE ARTHROSCOPY     x2 left   NEPHRECTOMY  2013   left   TRIGGER FINGER RELEASE Left 06/23/2015   Procedure: RELEASE TRIGGER FINGER/A-1 PULLEY LEFT RING FINGER;  Surgeon: Daryll Brod, MD;  Location: [place];  Service: Orthopedics;  Laterality: Left;   TUBAL LIGATION  1980   UPPER GASTROINTESTINAL ENDOSCOPY  12/12/2008   gastroporesis   XI ROBOTIC ASSISTED VENTRAL HERNIA N/A 01/21/2022   Procedure: ROBOTIC INCISIONAL HERNIA REPAIR WITH MESH, BILATERAL POSTERIOR RECTUS MYOFASCIAL RELEASE;  Surgeon: Felicie Morn, MD;  Location: [facility];  Service: General;  Laterality: N/A;    Family History  Problem Relation Age of Onset   Colon polyps Mother    Diabetes Maternal Grandmother    Breast cancer Maternal Grandmother    Diabetes Father    Breast cancer Daughter    Colon cancer Neg Hx     Social History:  reports that she has never smoked. She has never used smokeless tobacco. She reports that she does not drink alcohol and does not use drugs.  Allergies: No Known Allergies  Medications: I have reviewed the patient's current medications.  Results for orders placed or performed during the hospital encounter of 01/27/23 (from  the past 48 hour(s))  Glucose, capillary     Status: Abnormal   Collection Time: 01/28/23  3:49 PM  Result Value Ref Range   Glucose-Capillary 124 (H) 70 - 99 mg/dL    Comment: Glucose reference range applies only to samples taken after fasting for at least 8 hours.  Rapid urine drug screen (hospital performed)     Status: None   Collection Time: 01/28/23  6:41 PM  Result Value Ref Range   Opiates NONE DETECTED NONE DETECTED   Cocaine NONE DETECTED NONE DETECTED   Benzodiazepines NONE DETECTED NONE DETECTED   Amphetamines  NONE DETECTED NONE DETECTED   Tetrahydrocannabinol NONE DETECTED NONE DETECTED   Barbiturates NONE DETECTED NONE DETECTED    Comment: (NOTE) DRUG SCREEN FOR MEDICAL PURPOSES ONLY.  IF CONFIRMATION IS NEEDED FOR ANY PURPOSE, NOTIFY LAB WITHIN 5 DAYS.  LOWEST DETECTABLE LIMITS FOR URINE DRUG SCREEN Drug Class                     Cutoff (ng/mL) Amphetamine and metabolites    1000 Barbiturate and metabolites    200 Benzodiazepine                 200 Opiates and metabolites        300 Cocaine and metabolites        300 THC                            50 Performed at [facility], [place] [address] [place], [place] [address]   Glucose, capillary     Status: Abnormal   Collection Time: 01/28/23  8:30 PM  Result Value Ref Range   Glucose-Capillary 195 (H) 70 - 99 mg/dL    Comment: Glucose reference range applies only to samples taken after fasting for at least 8 hours.  Glucose, capillary     Status: Abnormal   Collection Time: 01/28/23 11:53 PM  Result Value Ref Range   Glucose-Capillary 104 (H) 70 - 99 mg/dL    Comment: Glucose reference range applies only to samples taken after fasting for at least 8 hours.  Lipid panel     Status: Abnormal   Collection Time: 01/29/23  3:23 AM  Result Value Ref Range   Cholesterol 242 (H) 0 - 200 mg/dL   Triglycerides 115 <150 mg/dL   HDL 48 >40 mg/dL   Total CHOL/HDL Ratio 5.0 RATIO   VLDL 23 0 - 40 mg/dL   LDL Cholesterol 171 (H) 0 - 99 mg/dL    Comment:        Total Cholesterol/HDL:CHD Risk Coronary Heart Disease Risk Table                     Men   Women  1/2 Average Risk   3.4   3.3  Average Risk       5.0   4.4  2 X Average Risk   9.6   7.1  3 X Average Risk  23.4   11.0        Use the calculated Patient Ratio above and the CHD Risk Table to determine the patient's CHD Risk.        ATP III CLASSIFICATION (LDL):  <100     mg/dL   Optimal  100-129  mg/dL   Near or Above  Optimal  130-159  mg/dL    Borderline  160-189  mg/dL   High  >190     mg/dL   Very High Performed at [facility] [address], [place], [place] [id number]   Basic metabolic panel     Status: Abnormal   Collection Time: 01/29/23  3:23 AM  Result Value Ref Range   Sodium 138 135 - 145 mmol/L   Potassium 4.1 3.5 - 5.1 mmol/L   Chloride 107 98 - 111 mmol/L   CO2 24 22 - 32 mmol/L   Glucose, Bld 113 (H) 70 - 99 mg/dL    Comment: Glucose reference range applies only to samples taken after fasting for at least 8 hours.   BUN 32 (H) 8 - 23 mg/dL   Creatinine, Ser 2.10 (H) 0.44 - 1.00 mg/dL   Calcium 8.5 (L) 8.9 - 10.3 mg/dL   GFR, Estimated 26 (L) >60 mL/min    Comment: (NOTE) Calculated using the CKD-EPI Creatinine Equation (2021)    Anion gap 7 5 - 15    Comment: Performed at [facility] [address], [place], [place] [address]  CBC     Status: None   Collection Time: 01/29/23  3:23 AM  Result Value Ref Range   WBC 6.7 4.0 - 10.5 K/uL   RBC 4.31 3.87 - 5.11 MIL/uL   Hemoglobin 12.0 12.0 - 15.0 g/dL   HCT 39.0 36.0 - 46.0 %   MCV 90.5 80.0 - 100.0 fL   MCH 27.8 26.0 - 34.0 pg   MCHC 30.8 30.0 - 36.0 g/dL   RDW 14.2 11.5 - 15.5 %   Platelets 226 150 - 400 K/uL   nRBC 0.0 0.0 - 0.2 %    Comment: Performed at [facility], [place] [address], [place], [place] [address]  Glucose, capillary     Status: Abnormal   Collection Time: 01/29/23  3:47 AM  Result Value Ref Range   Glucose-Capillary 128 (H) 70 - 99 mg/dL    Comment: Glucose reference range applies only to samples taken after fasting for at least 8 hours.  Glucose, capillary     Status: None   Collection Time: 01/29/23  8:13 AM  Result Value Ref Range   Glucose-Capillary 83 70 - 99 mg/dL    Comment: Glucose reference range applies only to samples taken after fasting for at least 8 hours.  Glucose, capillary     Status: None   Collection Time: 01/29/23 11:54 AM  Result Value Ref Range   Glucose-Capillary 88 70 - 99 mg/dL     Comment: Glucose reference range applies only to samples taken after fasting for at least 8 hours.   Comment 1 Glucose Stabilizer   Glucose, capillary     Status: Abnormal   Collection Time: 01/29/23  4:34 PM  Result Value Ref Range   Glucose-Capillary 106 (H) 70 - 99 mg/dL    Comment: Glucose reference range applies only to samples taken after fasting for at least 8 hours.  Glucose, capillary     Status: Abnormal   Collection Time: 01/29/23  7:57 PM  Result Value Ref Range   Glucose-Capillary 184 (H) 70 - 99 mg/dL    Comment: Glucose reference range applies only to samples taken after fasting for at least 8 hours.  Glucose, capillary     Status: Abnormal   Collection Time: 01/29/23 11:52 PM  Result Value Ref Range   Glucose-Capillary 123 (H) 70 -  99 mg/dL    Comment: Glucose reference range applies only to samples taken after fasting for at least 8 hours.  Glucose, capillary     Status: Abnormal   Collection Time: 01/30/23  3:47 AM  Result Value Ref Range   Glucose-Capillary 110 (H) 70 - 99 mg/dL    Comment: Glucose reference range applies only to samples taken after fasting for at least 8 hours.  Basic metabolic panel     Status: Abnormal   Collection Time: 01/30/23  6:58 AM  Result Value Ref Range   Sodium 135 135 - 145 mmol/L   Potassium 4.0 3.5 - 5.1 mmol/L   Chloride 104 98 - 111 mmol/L   CO2 24 22 - 32 mmol/L   Glucose, Bld 105 (H) 70 - 99 mg/dL    Comment: Glucose reference range applies only to samples taken after fasting for at least 8 hours.   BUN 32 (H) 8 - 23 mg/dL   Creatinine, Ser 1.78 (H) 0.44 - 1.00 mg/dL   Calcium 8.4 (L) 8.9 - 10.3 mg/dL   GFR, Estimated 31 (L) >60 mL/min    Comment: (NOTE) Calculated using the CKD-EPI Creatinine Equation (2021)    Anion gap 7 5 - 15    Comment: Performed at [facility] [address], [place], [place] [address]  Glucose, capillary     Status: None   Collection Time: 01/30/23  7:51 AM  Result Value Ref Range    Glucose-Capillary 83 70 - 99 mg/dL    Comment: Glucose reference range applies only to samples taken after fasting for at least 8 hours.  Glucose, capillary     Status: Abnormal   Collection Time: 01/30/23 11:22 AM  Result Value Ref Range   Glucose-Capillary 121 (H) 70 - 99 mg/dL    Comment: Glucose reference range applies only to samples taken after fasting for at least 8 hours.    DG Ankle Complete Left  Result Date: 01/30/2023 CLINICAL DATA:  Left anterior ankle pain for 1 day, initial encounter EXAM: LEFT ANKLE COMPLETE - 3+ VIEW COMPARISON:  None Available. FINDINGS: No acute fracture or dislocation is noted. Mild tarsal degenerative changes are seen. Mild soft tissue swelling is noted. IMPRESSION: Mild soft tissue swelling without acute bony abnormality. Electronically Signed   By: Inez Catalina M.D.   On: 01/30/2023 10:46   VAS US CAROTID  Result Date: 01/29/2023 Carotid Arterial Duplex Study Patient Name:  JOURDAN PASTERNAK  Date of Exam:   01/28/2023 Medical Rec #: [id number]           Accession #:    [id number] Date of Birth: [birth year]          Patient Gender: F Patient Age:   19 years Exam Location:  [facility] Procedure:      VAS US CAROTID Referring Phys: Elwin Sleight DE [place] TORRE --------------------------------------------------------------------------------  Indications:       CVA and ICH. Risk Factors:      Hypertension, hyperlipidemia, Diabetes. Limitations        Today's exam was limited due to the body habitus of the                    patient and somnolent. Comparison Study:  Prior normal carotid duplex done 01/06/18 Performing Technologist: Sharion Dove RVS  Examination Guidelines: A complete evaluation includes B-mode imaging, spectral Doppler, color Doppler, and power Doppler as needed of all accessible portions of each vessel. Bilateral testing is considered an  integral part of a complete examination. Limited examinations for reoccurring indications may be performed as  noted.  Right Carotid Findings: +----------+--------+--------+--------+------------------+------------------+           PSV cm/sEDV cm/sStenosisPlaque DescriptionComments           +----------+--------+--------+--------+------------------+------------------+ CCA Prox  79      8                                 intimal thickening +----------+--------+--------+--------+------------------+------------------+ CCA Distal85      13                                intimal thickening +----------+--------+--------+--------+------------------+------------------+ ICA Prox  58      10                                                   +----------+--------+--------+--------+------------------+------------------+ ICA Mid   69      14                                                   +----------+--------+--------+--------+------------------+------------------+ ICA Distal49      12                                                   +----------+--------+--------+--------+------------------+------------------+ ECA       100     12                                                   +----------+--------+--------+--------+------------------+------------------+ +----------+--------+-------+--------+-------------------+           PSV cm/sEDV cmsDescribeArm Pressure (mmHG) +----------+--------+-------+--------+-------------------+ [id number]                                         +----------+--------+-------+--------+-------------------+ +---------+--------+--+--------+--+ VertebralPSV cm/s50EDV cm/s14 +---------+--------+--+--------+--+  Left Carotid Findings: +----------+--------+--------+--------+------------------+------------------+           PSV cm/sEDV cm/sStenosisPlaque DescriptionComments           +----------+--------+--------+--------+------------------+------------------+ CCA Prox  78      9                                 intimal thickening  +----------+--------+--------+--------+------------------+------------------+ CCA Distal70      10                                intimal thickening +----------+--------+--------+--------+------------------+------------------+ ICA Prox  51      11                                                   +----------+--------+--------+--------+------------------+------------------+  ICA Mid   55      12                                                   +----------+--------+--------+--------+------------------+------------------+ ICA Distal44      14                                                   +----------+--------+--------+--------+------------------+------------------+ ECA       66      1                                                    +----------+--------+--------+--------+------------------+------------------+ +----------+--------+--------+--------+-------------------+           PSV cm/sEDV cm/sDescribeArm Pressure (mmHG) +----------+--------+--------+--------+-------------------+ [id number]                                         +----------+--------+--------+--------+-------------------+ +---------+--------+--+--------+--+ VertebralPSV cm/s58EDV cm/s10 +---------+--------+--+--------+--+   Summary: Right Carotid: The extracranial vessels were near-normal with only minimal wall                thickening or plaque. Left Carotid: The extracranial vessels were near-normal with only minimal wall               thickening or plaque. Vertebrals:  Bilateral vertebral arteries demonstrate antegrade flow. Subclavians: Normal flow hemodynamics were seen in bilateral subclavian              arteries. *See table(s) above for measurements and observations.  Electronically signed by Antony Contras MD on 01/29/2023 at 9:08:55 AM.    Final    EEG adult  Result Date: 01/28/2023 Lora Havens, MD     01/28/2023  6:05 PM Patient Name: [name] MRN: [id number]  Epilepsy Attending: Lora Havens Referring Physician/Provider: Rosalin Hawking, MD Date: 01/28/2023 Duration: 23.33 mins Patient history: 67yo F with ams. EEG to evaluate for seizure Level of alertness: Awake, drowsy AEDs during EEG study: Ativan Technical aspects: This EEG study was done with scalp electrodes positioned according to the 10-20 International system of electrode placement. Electrical activity was reviewed with band pass filter of 1-70Hz , sensitivity of 7 uV/mm, display speed of 40mm/sec with a 60Hz  notched filter applied as appropriate. EEG data were recorded continuously and digitally stored.  Video monitoring was available and reviewed as appropriate. Description: The posterior dominant rhythm consists of 9-10 Hz activity of moderate voltage (25-35 uV) seen predominantly in posterior head regions, symmetric and reactive to eye opening and eye closing. Drowsiness was characterized by attenuation of the posterior background rhythm. Hyperventilation and photic stimulation were not performed.   IMPRESSION: This study is within normal limits. No seizures or epileptiform discharges were seen throughout the recording. A normal interictal EEG does not exclude  the diagnosis of epilepsy. Lora Havens   MR ANGIO HEAD WO CONTRAST  Result Date: 01/28/2023 CLINICAL DATA:  Provided history: Stroke, hemorrhagic. EXAM: MRA HEAD WITHOUT CONTRAST TECHNIQUE: Angiographic images of  the Circle of Willis were acquired using MRA technique without intravenous contrast. COMPARISON:  MRA head 05/19/2021. FINDINGS: Anterior circulation: The intracranial internal carotid arteries are patent. The M1 middle cerebral arteries are patent. Severe stenosis within a mid to distal right M2 MCA vessel (at site of prior occlusion )(series 355, image 18). Progressive severe long segment stenosis within a proximal M2 left MCA vessel (series 355, image 6) (series 356, image 135). As before, the right anterior cerebral artery is  diminutive beyond the proximal right A2 segment. Mild atherosclerotic narrowing of the right A1 segment, unchanged. 2 mm inferiorly projecting vascular protrusion arising from the cavernous left internal carotid artery (series 355, image 15). This was not definitively present on the prior MRA head of 05/19/2021. Posterior circulation: The intracranial vertebral arteries are patent. The basilar artery is patent. The posterior cerebral arteries are patent. Progressive moderate/severe stenosis within the left PCA P2 segment. Posterior communicating arteries are present bilaterally. Anatomic variants: As described. Impressions #3 will be called to the ordering clinician or representative by the Radiologist Assistant, and communication documented in the PACS or [employer]. IMPRESSION: 1. No intracranial large vessel occlusion is identified. 2. Intracranial atherosclerotic disease with multifocal stenoses, most notably as follows. 3. Progressive severe long segment stenosis within a proximal M2 left middle cerebral artery vessel. 4. Severe stenosis within a mid-to-distal right M2 middle cerebral artery vessel (at site of prior occlusion). 5. Progressive moderate/severe stenosis within the left posterior cerebral artery P2 segment. 6. 2 mm inferiorly projecting vascular protrusion arising from the cavernous left internal carotid artery. This may reflect an aneurysm or the origin of an otherwise poorly delineated branch vessel. Electronically Signed   By: Kellie Simmering D.O.   On: 01/28/2023 17:59   CT HEAD WO CONTRAST (5MM)  Result Date: 01/28/2023 CLINICAL DATA:  Provided history: Stroke, hemorrhagic. EXAM: CT HEAD WITHOUT CONTRAST TECHNIQUE: Contiguous axial images were obtained from the base of the skull through the vertex without intravenous contrast. RADIATION DOSE REDUCTION: This exam was performed according to the departmental dose-optimization program which includes automated exposure control, adjustment of  the mA and/or kV according to patient size and/or use of iterative reconstruction technique. COMPARISON:  Brain MRI performed earlier today 01/28/2023. Prior head CT examinations 01/27/2023 and earlier. FINDINGS: Brain: No age advanced or lobar predominant parenchymal atrophy. The acute parenchymal hemorrhage within the left thalamus measures 1.1 x 0.8 x 0.9 cm on the current exam (< 1 mL). This is slightly decreased in size from the prior head CT of 01/27/2023 (previously measuring 1.2 x 0.9 x 0.8 cm). Mild surrounding edema is unchanged. Multiple small subacute infarcts within the left frontoparietal white matter were better appreciated on the brain MRI performed earlier today. Chronic lacunar infarcts within the right corona radiata and bilateral deep gray nuclei. Background patchy and ill-defined hypoattenuation within the cerebral white matter, nonspecific but compatible chronic small vessel ischemic disease (moderate to severe by MRI performed earlier today). Redemonstrated chronic infarct within the right basal ganglia/insula. Associated chronic blood products at this site on MRI brain performed earlier today. Partially empty sella turcica. No extra-axial fluid collection. No evidence of an intracranial mass. No midline shift. Vascular: No hyperdense vessel. Atherosclerotic calcifications. Skull: No fracture or aggressive osseous lesion. Sinuses/Orbits: No mass or acute finding within the imaged orbits. No significant paranasal sinus disease at the imaged levels. IMPRESSION: 1. Known small (< 1 mL) acute parenchymal hemorrhage within the left thalamus, slightly decreased in size from the head CT of  01/27/2023. Mild surrounding edema is unchanged. 2. Multiple small subacute infarcts within the left frontoparietal white matter were better appreciated on the brain MRI performed earlier today. 3. Background chronic small vessel disease and chronic lacunar infarcts as described. Electronically Signed   By: Kellie Simmering D.O.   On: 01/28/2023 17:45   MR BRAIN WO CONTRAST  Result Date: 01/28/2023 CLINICAL DATA:  Provided history: Stroke, follow-up. EXAM: MRI HEAD WITHOUT CONTRAST TECHNIQUE: Multiplanar, multiecho pulse sequences of the brain and surrounding structures were obtained without intravenous contrast. COMPARISON:  Head CT 01/27/2023.  Brain MRI 05/19/2021. FINDINGS: Intermittently motion degraded exam. Brain: No age advanced or lobar predominant parenchymal atrophy. Small acute hemorrhage within the left thalamus, grossly unchanged from the head CT of 01/27/2023. Multiple small subacute infarcts within the left frontoparietal white matter (MCA vascular territory) measuring up to 9 mm. Chronic lacunar infarcts within the right corona radiata and bilateral deep gray nuclei. Chronic hemorrhages within the bilateral deep gray nuclei and within the brainstem. A chronic hemorrhage within the left thalamus is new from the prior brain MRI of 05/19/2021. Multifocal T2 FLAIR hyperintense signal abnormality within the cerebral white matter, nonspecific but compatible with moderate to advanced chronic small vessel ischemic disease. Moderate chronic small vessel ischemic changes within the pons, including a tiny chronic lacunar infarct centrally. Tiny chronic infarct within the right cerebellar hemisphere. Partially empty sella turcica. No evidence of an intracranial mass. No extra-axial fluid collection. No midline shift. Vascular: Maintained flow voids within the proximal large arterial vessels. Skull and upper cervical spine: No focal suspicious marrow lesion. Incompletely assessed cervical spondylosis. Sinuses/Orbits: No mass or acute finding within the imaged orbits. No significant paranasal sinus disease. Impressions #1 and #2 will be called to the ordering clinician or representative by the Radiologist Assistant, and communication documented in the PACS or [employer]. IMPRESSION: 1. Small acute parenchymal  hemorrhage within the left thalamus, unchanged in size from the head CT performed 01/27/2023. 2. Small subacute infarcts within the left frontoparietal white matter, measuring up to 9 mm. 3. Background chronic small vessel ischemic disease, chronic infarcts and chronic hemorrhages as described Electronically Signed   By: Kellie Simmering D.O.   On: 01/28/2023 15:04    Review of Systems  Constitutional:  Negative for chills, diaphoresis and fever.  HENT:  Negative for ear discharge, ear pain, hearing loss and tinnitus.   Eyes:  Negative for photophobia and pain.  Respiratory:  Negative for cough and shortness of breath.   Cardiovascular:  Negative for chest pain.  Gastrointestinal:  Negative for abdominal pain, nausea and vomiting.  Genitourinary:  Negative for dysuria, flank pain, frequency and urgency.  Musculoskeletal:  Positive for arthralgias (Left foot). Negative for back pain, myalgias and neck pain.  Neurological:  Negative for dizziness and headaches.  Hematological:  Does not bruise/bleed easily.  Psychiatric/Behavioral:  The patient is not nervous/anxious.    Blood pressure (!) 146/74, pulse 82, temperature 98.3 F (36.8 C), temperature source Oral, resp. rate 16, height 5\' 2"  (1.575 m), weight 103 kg, last menstrual period 11/16/2002, SpO2 98 %. Physical Exam Constitutional:      General: She is not in acute distress.    Appearance: She is well-developed. She is not diaphoretic.  HENT:     Head: Normocephalic and atraumatic.  Eyes:     General: No scleral icterus.       Right eye: No discharge.        Left eye: No discharge.  Conjunctiva/sclera: Conjunctivae normal.  Cardiovascular:     Rate and Rhythm: Normal rate and regular rhythm.  Pulmonary:     Effort: Pulmonary effort is normal. No respiratory distress.  Musculoskeletal:     Cervical back: Normal range of motion.     Comments: LLE No traumatic wounds, ecchymosis, or rash  Mod TTP dorsum of foot  No knee or ankle  effusion  Knee stable to varus/ valgus and anterior/posterior stress  Sens DPN, SPN, TN intact  Motor EHL, ext, flex, evers 5/5  DP 2+, PT 2+, dorsum of foot with 1+ NP edema  Skin:    General: Skin is warm and dry.  Neurological:     Mental Status: She is alert.  Psychiatric:        Mood and Affect: Mood normal.        Behavior: Behavior normal.     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 I can't r/o cellulitis so would treat for that as well (Ancef 1mg  q8h). If no improvement in 48h would get MRI w/wo to better characterize.    Lisette Abu, PA-C Orthopedic Surgery [phone] 01/30/2023, 12:12 PM   ATTENDING ADDENDUM:  I examined this patient directly at bedside and reviewed history, exam and imaging at length with her. The patient reports that the left midfoot pain started this AM and has since been resolving. She reports that it is now 50% better and continues to improve hourly. She has minimal TTP over the foot and no erythema or swelling. Denies injury. X-rays do not show acute findings. We discussed the possibility of a gout flare with or without cellulitis. Plan will be to treat for both. If no improvement/worse in 48 hours, please obtain MRI left foot with and without contrast to evaluate midfoot further.  Monica Hang, MD Orthopaedic Surgery [facility]

## 2023-01-30 NOTE — Consult Note (Signed)
Physical Medicine and Rehabilitation Consult Reason for Consult:right sided weakness and numbness Referring Physician: Erlinda Hong   HPI: [name] is a 67 y.o. 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  She presented on 01/27/2023 with right sided numbness.  MRI revealed a small left thalamic intraparenchymal hemorrhage and small subacute ischemic infarcts in the left frontoparietal white matter.  Carotid Doppler was negative..  Her systolic blood pressure was in the 200s.  Blood pressure has improved.  81 mg aspirin was held and the plan is to repeat a head CT in about 2 weeks to help determine whether it can be resumed.  Patient was up with physical therapy yesterday and transferred min assist +2 for sit to stand.  She walked 100 feet with min assist plus 2 hand-held assist with decreased stride length and posterior lean.   Review of Systems  Constitutional:  Negative for chills and fever.  HENT:  Negative for hearing loss.   Eyes:  Negative for blurred vision and double vision.  Respiratory: Negative.    Cardiovascular: Negative.   Gastrointestinal:  Negative for nausea and vomiting.  Genitourinary:  Negative for dysuria.  Musculoskeletal:  Negative for myalgias.  Skin:  Negative for rash.  Neurological:  Positive for dizziness, tingling, sensory change and focal weakness.  Psychiatric/Behavioral:  Negative for depression and suicidal ideas.    Past Medical History:  Diagnosis Date   Anemia    Blood transfusion 2010   associated with GI bleeding   CKD (chronic kidney disease) stage 2, GFR 60-89 ml/min    Depression    Diabetes mellitus    Diverticulosis of colon with hemorrhage 2010, 09/2011   H/O hiatal hernia    Helicobacter pylori gastritis 09/13/2013   Hx: UTI (urinary tract infection)    Hypertension    Personal history of colonic polyps    adenomas since 2010   Renal cell carcinoma 2004    lt kidney removed   Stroke [facility]) 12/2017   05/2021   Past Surgical History:  Procedure Laterality Date   ABDOMINAL HYSTERECTOMY  2004   BIOPSY  10/23/2019   Procedure: BIOPSY;  Surgeon: Lavena Bullion, DO;  Location: [facility];  Service: Gastroenterology;;   CHOLECYSTECTOMY     COLONOSCOPY  12/19/2008   diverticulosis   COLONOSCOPY  11/17/2011   Procedure: COLONOSCOPY;  Surgeon: Zenovia Jarred, MD;  Location: [facility];  Service: Gastroenterology;  Laterality: N/A;   DILATION AND CURETTAGE OF UTERUS N/A 07/11/2014   Procedure: Repair of Vaginal Cuff;  Surgeon: Melina Schools, MD;  Location: [facility];  Service: Gynecology;  Laterality: N/A;   ESOPHAGOGASTRODUODENOSCOPY (EGD) WITH PROPOFOL N/A 10/23/2019   Procedure: ESOPHAGOGASTRODUODENOSCOPY (EGD) WITH PROPOFOL;  Surgeon: Lavena Bullion, DO;  Location: [place];  Service: Gastroenterology;  Laterality: N/A;   FLEXIBLE SIGMOIDOSCOPY  12/12/2008   diverticulosis   HAND SURGERY     x5 right   HYDRADENITIS EXCISION Right 03/21/2014   Procedure: EXCISION HIDRADENITIS AXILLA;  Surgeon: Pedro Earls, MD;  Location: [place];  Service: General;  Laterality: Right;   KNEE ARTHROSCOPY     x2 left   NEPHRECTOMY  2013   left   TRIGGER FINGER RELEASE Left 06/23/2015   Procedure: RELEASE TRIGGER FINGER/A-1 PULLEY LEFT RING FINGER;  Surgeon: Daryll Brod, MD;  Location: [place];  Service: Orthopedics;  Laterality: Left;   TUBAL LIGATION  1980   UPPER  GASTROINTESTINAL ENDOSCOPY  12/12/2008   gastroporesis   XI ROBOTIC ASSISTED VENTRAL HERNIA N/A 01/21/2022   Procedure: ROBOTIC INCISIONAL HERNIA REPAIR WITH MESH, BILATERAL POSTERIOR RECTUS MYOFASCIAL RELEASE;  Surgeon: Felicie Morn, MD;  Location: [facility];  Service: General;  Laterality: N/A;   Family History  Problem Relation Age of Onset   Colon polyps Mother    Diabetes Maternal Grandmother    Breast cancer Maternal Grandmother     Diabetes Father    Breast cancer Daughter    Colon cancer Neg Hx    Social History:  reports that she has never smoked. She has never used smokeless tobacco. She reports that she does not drink alcohol and does not use drugs. Allergies: No Known Allergies Medications Prior to Admission  Medication Sig Dispense Refill   albuterol (VENTOLIN HFA) 108 (90 Base) MCG/ACT inhaler Inhale 2 puffs into the lungs every 6 (six) hours as needed for wheezing.     Exenatide ER 2 MG PEN Inject 2 mg into the skin every Saturday.     fluticasone (FLONASE) 50 MCG/ACT nasal spray Place 1 spray into both nostrils daily as needed for allergies.     lisinopril-hydrochlorothiazide (ZESTORETIC) 20-12.5 MG tablet Take 2 tablets by mouth daily.     carvedilol (COREG) 6.25 MG tablet Take 6.25 mg by mouth 2 (two) times daily with a meal. (Patient not taking: Reported on 01/27/2023)      Home: Home Living Family/patient expects to be discharged to:: Private residence Living Arrangements: Other relatives (grandson) Available Help at Discharge: Family, Available PRN/intermittently Type of Home: Apartment Home Access: Level entry Home Layout: One level Bathroom Shower/Tub: Chiropodist: Standard Home Equipment: Conservation officer, nature (2 wheels), BSC/3in1, Financial controller: Reacher  Functional History: Prior Function Prior Level of Function : Independent/Modified Independent, Driving Mobility Comments: independent ADLs Comments: stands in shower now (used to use BSC in tub to sit); does housework, goes shopping Functional Status:  Mobility: Bed Mobility Overal bed mobility: Modified Independent General bed mobility comments: including managing linens Transfers Overall transfer level: Needs assistance Equipment used: 2 person hand held assist Transfers: Sit to/from Stand Sit to Stand: Min assist, +2 physical assistance Bed to/from chair/wheelchair/BSC transfer type:: Step  pivot Step pivot transfers: Min assist, +2 physical assistance General transfer comment: posterior imbalance when coming to stand with assist to recover Ambulation/Gait Ambulation/Gait assistance: +2 physical assistance, Mod assist Gait Distance (Feet): 100 Feet Assistive device: 2 person hand held assist, 1 person hand held assist Gait Pattern/deviations: Step-to pattern, Decreased stride length, Shuffle, Step-through pattern, Leaning posteriorly, Wide base of support General Gait Details: Pt with incr lateral excursion and incr support via bil UE progressing to just LUE; initially did not want to use RW however by end of walk able to acknowledge that she should use. RN also educated. Gait velocity interpretation: 1.31 - 2.62 ft/sec, indicative of limited community ambulator    ADL: ADL Overall ADL's : Needs assistance/impaired Eating/Feeding: Set up, Sitting Eating/Feeding Details (indicate cue type and reason): using L hand as R hand was incoordinated Grooming: Set up, Sitting Upper Body Bathing: Minimal assistance, Sitting Lower Body Bathing: Minimal assistance, Sitting/lateral leans, Sit to/from stand, Cueing for safety Upper Body Dressing : Minimal assistance, Sitting Lower Body Dressing: Minimal assistance, Sitting/lateral leans, Sit to/from stand Toilet Transfer: Min guard, Cueing for safety Toileting- Clothing Manipulation and Hygiene: Min guard, Sitting/lateral lean, Bed level Functional mobility during ADLs: Min guard, Cueing for safety, Cueing for sequencing  Cognition:  Cognition Overall Cognitive Status: Within Functional Limits for tasks assessed Orientation Level: Oriented X4 Cognition Arousal/Alertness: Awake/alert Behavior During Therapy: WFL for tasks assessed/performed Overall Cognitive Status: Within Functional Limits for tasks assessed General Comments: Passed Short Blessed Test for recall and problem solving.  Blood pressure (!) 146/74, pulse 82, temperature  98.6 F (37 C), temperature source Oral, resp. rate 16, height 5\' 2"  (1.575 m), weight 103 kg, last menstrual period 11/16/2002, SpO2 98 %. Physical Exam Constitutional:      Appearance: She is obese.  HENT:     Head: Normocephalic and atraumatic.     Right Ear: External ear normal.     Left Ear: External ear normal.     Nose: Nose normal.     Mouth/Throat:     Mouth: Mucous membranes are moist.  Eyes:     Extraocular Movements: Extraocular movements intact.     Pupils: Pupils are equal, round, and reactive to light.  Cardiovascular:     Rate and Rhythm: Normal rate and regular rhythm.  Pulmonary:     Effort: Pulmonary effort is normal. No respiratory distress.  Abdominal:     Palpations: Abdomen is soft.  Musculoskeletal:        General: No swelling. Normal range of motion.     Cervical back: Normal range of motion.  Skin:    General: Skin is warm and dry.  Neurological:     Mental Status: She is alert.     Comments: Alert and oriented x 3. Normal insight and awareness. Intact Memory. Normal language and speech. Cranial nerve exam unremarkable today. Decreased LT slightly in LUE. RUE>RLE sensory loss present. Decreased [place] RUE and RLE present. No focal ataxia. No resting tone and DTR's 1+  Psychiatric:        Mood and Affect: Mood normal.        Behavior: Behavior normal.     Results for orders placed or performed during the hospital encounter of 01/27/23 (from the past 24 hour(s))  Glucose, capillary     Status: None   Collection Time: 01/29/23 11:54 AM  Result Value Ref Range   Glucose-Capillary 88 70 - 99 mg/dL   Comment 1 Glucose Stabilizer   Glucose, capillary     Status: Abnormal   Collection Time: 01/29/23  4:34 PM  Result Value Ref Range   Glucose-Capillary 106 (H) 70 - 99 mg/dL  Glucose, capillary     Status: Abnormal   Collection Time: 01/29/23  7:57 PM  Result Value Ref Range   Glucose-Capillary 184 (H) 70 - 99 mg/dL  Glucose, capillary     Status:  Abnormal   Collection Time: 01/29/23 11:52 PM  Result Value Ref Range   Glucose-Capillary 123 (H) 70 - 99 mg/dL  Glucose, capillary     Status: Abnormal   Collection Time: 01/30/23  3:47 AM  Result Value Ref Range   Glucose-Capillary 110 (H) 70 - 99 mg/dL  Basic metabolic panel     Status: Abnormal   Collection Time: 01/30/23  6:58 AM  Result Value Ref Range   Sodium 135 135 - 145 mmol/L   Potassium 4.0 3.5 - 5.1 mmol/L   Chloride 104 98 - 111 mmol/L   CO2 24 22 - 32 mmol/L   Glucose, Bld 105 (H) 70 - 99 mg/dL   BUN 32 (H) 8 - 23 mg/dL   Creatinine, Ser 1.78 (H) 0.44 - 1.00 mg/dL   Calcium 8.4 (L) 8.9 - 10.3 mg/dL   GFR, Estimated  31 (L) >60 mL/min   Anion gap 7 5 - 15  Glucose, capillary     Status: None   Collection Time: 01/30/23  7:51 AM  Result Value Ref Range   Glucose-Capillary 83 70 - 99 mg/dL   VAS US CAROTID  Result Date: 01/29/2023 Carotid Arterial Duplex Study Patient Name:  [name]  Date of Exam:   01/28/2023 Medical Rec #: [id number]           Accession #:    [id number] Date of Birth: 1955/12/20          Patient Gender: F Patient Age:   40 years Exam Location:  [facility] Procedure:      VAS US CAROTID Referring Phys: Elwin Sleight DE LA TORRE --------------------------------------------------------------------------------  Indications:       CVA and ICH. Risk Factors:      Hypertension, hyperlipidemia, Diabetes. Limitations        Today's exam was limited due to the body habitus of the                    patient and somnolent. Comparison Study:  Prior normal carotid duplex done 01/06/18 Performing Technologist: Sharion Dove RVS  Examination Guidelines: A complete evaluation includes B-mode imaging, spectral Doppler, color Doppler, and power Doppler as needed of all accessible portions of each vessel. Bilateral testing is considered an integral part of a complete examination. Limited examinations for reoccurring indications may be performed as noted.  Right  Carotid Findings: +----------+--------+--------+--------+------------------+------------------+           PSV cm/sEDV cm/sStenosisPlaque DescriptionComments           +----------+--------+--------+--------+------------------+------------------+ CCA Prox  79      8                                 intimal thickening +----------+--------+--------+--------+------------------+------------------+ CCA Distal85      13                                intimal thickening +----------+--------+--------+--------+------------------+------------------+ ICA Prox  58      10                                                   +----------+--------+--------+--------+------------------+------------------+ ICA Mid   69      14                                                   +----------+--------+--------+--------+------------------+------------------+ ICA Distal49      12                                                   +----------+--------+--------+--------+------------------+------------------+ ECA       100     12                                                   +----------+--------+--------+--------+------------------+------------------+ +----------+--------+-------+--------+-------------------+  PSV cm/sEDV cmsDescribeArm Pressure (mmHG) +----------+--------+-------+--------+-------------------+ [id number]                                         +----------+--------+-------+--------+-------------------+ +---------+--------+--+--------+--+ VertebralPSV cm/s50EDV cm/s14 +---------+--------+--+--------+--+  Left Carotid Findings: +----------+--------+--------+--------+------------------+------------------+           PSV cm/sEDV cm/sStenosisPlaque DescriptionComments           +----------+--------+--------+--------+------------------+------------------+ CCA Prox  78      9                                 intimal thickening  +----------+--------+--------+--------+------------------+------------------+ CCA Distal70      10                                intimal thickening +----------+--------+--------+--------+------------------+------------------+ ICA Prox  51      11                                                   +----------+--------+--------+--------+------------------+------------------+ ICA Mid   55      12                                                   +----------+--------+--------+--------+------------------+------------------+ ICA Distal44      14                                                   +----------+--------+--------+--------+------------------+------------------+ ECA       66      1                                                    +----------+--------+--------+--------+------------------+------------------+ +----------+--------+--------+--------+-------------------+           PSV cm/sEDV cm/sDescribeArm Pressure (mmHG) +----------+--------+--------+--------+-------------------+ [id number]                                         +----------+--------+--------+--------+-------------------+ +---------+--------+--+--------+--+ VertebralPSV cm/s58EDV cm/s10 +---------+--------+--+--------+--+   Summary: Right Carotid: The extracranial vessels were near-normal with only minimal wall                thickening or plaque. Left Carotid: The extracranial vessels were near-normal with only minimal wall               thickening or plaque. Vertebrals:  Bilateral vertebral arteries demonstrate antegrade flow. Subclavians: Normal flow hemodynamics were seen in bilateral subclavian              arteries. *See table(s) above for measurements and observations.  Electronically signed by Antony Contras MD on 01/29/2023 at 9:08:55 AM.    Final  EEG adult  Result Date: 01/28/2023 Lora Havens, MD     01/28/2023  6:05 PM Patient Name: [name] MRN: [id number]  Epilepsy Attending: Lora Havens Referring Physician/Provider: Rosalin Hawking, MD Date: 01/28/2023 Duration: 23.33 mins Patient history: 67yo F with ams. EEG to evaluate for seizure Level of alertness: Awake, drowsy AEDs during EEG study: Ativan Technical aspects: This EEG study was done with scalp electrodes positioned according to the 10-20 International system of electrode placement. Electrical activity was reviewed with band pass filter of 1-70Hz , sensitivity of 7 uV/mm, display speed of 56mm/sec with a 60Hz  notched filter applied as appropriate. EEG data were recorded continuously and digitally stored.  Video monitoring was available and reviewed as appropriate. Description: The posterior dominant rhythm consists of 9-10 Hz activity of moderate voltage (25-35 uV) seen predominantly in posterior head regions, symmetric and reactive to eye opening and eye closing. Drowsiness was characterized by attenuation of the posterior background rhythm. Hyperventilation and photic stimulation were not performed.   IMPRESSION: This study is within normal limits. No seizures or epileptiform discharges were seen throughout the recording. A normal interictal EEG does not exclude  the diagnosis of epilepsy. Lora Havens   MR ANGIO HEAD WO CONTRAST  Result Date: 01/28/2023 CLINICAL DATA:  Provided history: Stroke, hemorrhagic. EXAM: MRA HEAD WITHOUT CONTRAST TECHNIQUE: Angiographic images of the Circle of Willis were acquired using MRA technique without intravenous contrast. COMPARISON:  MRA head 05/19/2021. FINDINGS: Anterior circulation: The intracranial internal carotid arteries are patent. The M1 middle cerebral arteries are patent. Severe stenosis within a mid to distal right M2 MCA vessel (at site of prior occlusion )(series 355, image 18). Progressive severe long segment stenosis within a proximal M2 left MCA vessel (series 355, image 6) (series 356, image 135). As before, the right anterior cerebral artery is  diminutive beyond the proximal right A2 segment. Mild atherosclerotic narrowing of the right A1 segment, unchanged. 2 mm inferiorly projecting vascular protrusion arising from the cavernous left internal carotid artery (series 355, image 15). This was not definitively present on the prior MRA head of 05/19/2021. Posterior circulation: The intracranial vertebral arteries are patent. The basilar artery is patent. The posterior cerebral arteries are patent. Progressive moderate/severe stenosis within the left PCA P2 segment. Posterior communicating arteries are present bilaterally. Anatomic variants: As described. Impressions #3 will be called to the ordering clinician or representative by the Radiologist Assistant, and communication documented in the PACS or [employer]. IMPRESSION: 1. No intracranial large vessel occlusion is identified. 2. Intracranial atherosclerotic disease with multifocal stenoses, most notably as follows. 3. Progressive severe long segment stenosis within a proximal M2 left middle cerebral artery vessel. 4. Severe stenosis within a mid-to-distal right M2 middle cerebral artery vessel (at site of prior occlusion). 5. Progressive moderate/severe stenosis within the left posterior cerebral artery P2 segment. 6. 2 mm inferiorly projecting vascular protrusion arising from the cavernous left internal carotid artery. This may reflect an aneurysm or the origin of an otherwise poorly delineated branch vessel. Electronically Signed   By: Kellie Simmering D.O.   On: 01/28/2023 17:59   CT HEAD WO CONTRAST (5MM)  Result Date: 01/28/2023 CLINICAL DATA:  Provided history: Stroke, hemorrhagic. EXAM: CT HEAD WITHOUT CONTRAST TECHNIQUE: Contiguous axial images were obtained from the base of the skull through the vertex without intravenous contrast. RADIATION DOSE REDUCTION: This exam was performed according to the departmental dose-optimization program which includes automated exposure control, adjustment of  the mA and/or kV according to patient  size and/or use of iterative reconstruction technique. COMPARISON:  Brain MRI performed earlier today 01/28/2023. Prior head CT examinations 01/27/2023 and earlier. FINDINGS: Brain: No age advanced or lobar predominant parenchymal atrophy. The acute parenchymal hemorrhage within the left thalamus measures 1.1 x 0.8 x 0.9 cm on the current exam (< 1 mL). This is slightly decreased in size from the prior head CT of 01/27/2023 (previously measuring 1.2 x 0.9 x 0.8 cm). Mild surrounding edema is unchanged. Multiple small subacute infarcts within the left frontoparietal white matter were better appreciated on the brain MRI performed earlier today. Chronic lacunar infarcts within the right corona radiata and bilateral deep gray nuclei. Background patchy and ill-defined hypoattenuation within the cerebral white matter, nonspecific but compatible chronic small vessel ischemic disease (moderate to severe by MRI performed earlier today). Redemonstrated chronic infarct within the right basal ganglia/insula. Associated chronic blood products at this site on MRI brain performed earlier today. Partially empty sella turcica. No extra-axial fluid collection. No evidence of an intracranial mass. No midline shift. Vascular: No hyperdense vessel. Atherosclerotic calcifications. Skull: No fracture or aggressive osseous lesion. Sinuses/Orbits: No mass or acute finding within the imaged orbits. No significant paranasal sinus disease at the imaged levels. IMPRESSION: 1. Known small (< 1 mL) acute parenchymal hemorrhage within the left thalamus, slightly decreased in size from the head CT of 01/27/2023. Mild surrounding edema is unchanged. 2. Multiple small subacute infarcts within the left frontoparietal white matter were better appreciated on the brain MRI performed earlier today. 3. Background chronic small vessel disease and chronic lacunar infarcts as described. Electronically Signed   By: Kellie Simmering D.O.   On: 01/28/2023 17:45   MR BRAIN WO CONTRAST  Result Date: 01/28/2023 CLINICAL DATA:  Provided history: Stroke, follow-up. EXAM: MRI HEAD WITHOUT CONTRAST TECHNIQUE: Multiplanar, multiecho pulse sequences of the brain and surrounding structures were obtained without intravenous contrast. COMPARISON:  Head CT 01/27/2023.  Brain MRI 05/19/2021. FINDINGS: Intermittently motion degraded exam. Brain: No age advanced or lobar predominant parenchymal atrophy. Small acute hemorrhage within the left thalamus, grossly unchanged from the head CT of 01/27/2023. Multiple small subacute infarcts within the left frontoparietal white matter (MCA vascular territory) measuring up to 9 mm. Chronic lacunar infarcts within the right corona radiata and bilateral deep gray nuclei. Chronic hemorrhages within the bilateral deep gray nuclei and within the brainstem. A chronic hemorrhage within the left thalamus is new from the prior brain MRI of 05/19/2021. Multifocal T2 FLAIR hyperintense signal abnormality within the cerebral white matter, nonspecific but compatible with moderate to advanced chronic small vessel ischemic disease. Moderate chronic small vessel ischemic changes within the pons, including a tiny chronic lacunar infarct centrally. Tiny chronic infarct within the right cerebellar hemisphere. Partially empty sella turcica. No evidence of an intracranial mass. No extra-axial fluid collection. No midline shift. Vascular: Maintained flow voids within the proximal large arterial vessels. Skull and upper cervical spine: No focal suspicious marrow lesion. Incompletely assessed cervical spondylosis. Sinuses/Orbits: No mass or acute finding within the imaged orbits. No significant paranasal sinus disease. Impressions #1 and #2 will be called to the ordering clinician or representative by the Radiologist Assistant, and communication documented in the PACS or [employer]. IMPRESSION: 1. Small acute parenchymal  hemorrhage within the left thalamus, unchanged in size from the head CT performed 01/27/2023. 2. Small subacute infarcts within the left frontoparietal white matter, measuring up to 9 mm. 3. Background chronic small vessel ischemic disease, chronic infarcts and chronic hemorrhages as described Electronically  Signed   By: Kellie Simmering D.O.   On: 01/28/2023 15:04   ECHOCARDIOGRAM COMPLETE  Result Date: 01/28/2023    ECHOCARDIOGRAM REPORT   Patient Name:   [name] Date of Exam: 01/28/2023 Medical Rec #:  [id number]          Height:       62.0 in Accession #:    [id number]         Weight:       227.0 lb Date of Birth:  [birth year]         BSA:          2.017 m Patient Age:    67 years           BP:           142/77 mmHg Patient Gender: F                  HR:           84 bpm. Exam Location:  Inpatient Procedure: 2D Echo, Cardiac Doppler and Color Doppler Indications:    Stroke I63.9  History:        Patient has prior history of Echocardiogram examinations, most                 recent 05/19/2021. Stroke, Signs/Symptoms:Chest Pain; Risk                 Factors:Hypertension, Dyslipidemia and Diabetes. CKD, stage 2.  Sonographer:    Ronny Flurry Referring Phys: Merlín.Osler MCNEILL P [place]  1. Left ventricular ejection fraction, by estimation, is 60 to 65%. The left ventricle has normal function. The left ventricle has no regional wall motion abnormalities. There is mild left ventricular hypertrophy of the septal segment. Left ventricular diastolic parameters are consistent with Grade I diastolic dysfunction (impaired relaxation).  2. Right ventricular systolic function is normal. The right ventricular size is normal.  3. The mitral valve is normal in structure. No evidence of mitral valve regurgitation. No evidence of mitral stenosis.  4. The aortic valve is tricuspid. There is mild calcification of the aortic valve. Aortic valve regurgitation is not visualized. Aortic valve sclerosis is present,  with no evidence of aortic valve stenosis. Aortic valve mean gradient measures 3.0 mmHg. Aortic valve Vmax measures 1.22 m/s.  5. The inferior vena cava is normal in size with greater than 50% respiratory variability, suggesting right atrial pressure of 3 mmHg. Conclusion(s)/Recommendation(s): No intracardiac source of embolism detected on this transthoracic study. Consider a transesophageal echocardiogram to exclude cardiac source of embolism if clinically indicated. FINDINGS  Left Ventricle: Left ventricular ejection fraction, by estimation, is 60 to 65%. The left ventricle has normal function. The left ventricle has no regional wall motion abnormalities. The left ventricular internal cavity size was normal in size. There is  mild left ventricular hypertrophy of the septal segment. Left ventricular diastolic parameters are consistent with Grade I diastolic dysfunction (impaired relaxation). Right Ventricle: The right ventricular size is normal. No increase in right ventricular wall thickness. Right ventricular systolic function is normal. Left Atrium: Left atrial size was normal in size. Right Atrium: Right atrial size was normal in size. Pericardium: There is no evidence of pericardial effusion. Presence of epicardial fat layer. Mitral Valve: The mitral valve is normal in structure. No evidence of mitral valve regurgitation. No evidence of mitral valve stenosis. Tricuspid Valve: The tricuspid valve is normal in structure. Tricuspid valve regurgitation is not demonstrated. No evidence of tricuspid stenosis.  Aortic Valve: The aortic valve is tricuspid. There is mild calcification of the aortic valve. Aortic valve regurgitation is not visualized. Aortic valve sclerosis is present, with no evidence of aortic valve stenosis. Aortic valve mean gradient measures 3.0 mmHg. Aortic valve peak gradient measures 6.0 mmHg. Aortic valve area, by VTI measures 2.85 cm. Pulmonic Valve: The pulmonic valve was normal in structure.  Pulmonic valve regurgitation is not visualized. No evidence of pulmonic stenosis. Aorta: The aortic root is normal in size and structure. Venous: The inferior vena cava is normal in size with greater than 50% respiratory variability, suggesting right atrial pressure of 3 mmHg. IAS/Shunts: No atrial level shunt detected by color flow Doppler.  LEFT VENTRICLE PLAX 2D LVIDd:         5.00 cm   Diastology LVIDs:         3.10 cm   LV e' medial:    7.23 cm/s LV PW:         1.10 cm   LV E/e' medial:  9.9 LV IVS:        1.20 cm   LV e' lateral:   6.09 cm/s LVOT diam:     2.10 cm   LV E/e' lateral: 11.8 LV SV:         64 LV SV Index:   32 LVOT Area:     3.46 cm  RIGHT VENTRICLE             IVC RV S prime:     12.80 cm/s  IVC diam: 1.60 cm LEFT ATRIUM             Index LA diam:        3.10 cm 1.54 cm/m LA Vol (A2C):   40.9 ml 20.27 ml/m LA Vol (A4C):   52.4 ml 25.97 ml/m LA Biplane Vol: 49.0 ml 24.29 ml/m  AORTIC VALVE AV Area (Vmax):    2.87 cm AV Area (Vmean):   2.76 cm AV Area (VTI):     2.85 cm AV Vmax:           122.00 cm/s AV Vmean:          82.100 cm/s AV VTI:            0.225 m AV Peak Grad:      6.0 mmHg AV Mean Grad:      3.0 mmHg LVOT Vmax:         101.23 cm/s LVOT Vmean:        65.400 cm/s LVOT VTI:          0.185 m LVOT/AV VTI ratio: 0.82  AORTA Ao Root diam: 3.00 cm Ao Asc diam:  2.60 cm MITRAL VALVE MV Area (PHT): 3.65 cm    SHUNTS MV Decel Time: 208 msec    Systemic VTI:  0.19 m MV E velocity: 71.60 cm/s  Systemic Diam: 2.10 cm MV A velocity: 97.30 cm/s MV E/A ratio:  0.74 Candee Furbish MD Electronically signed by Candee Furbish MD Signature Date/Time: 01/28/2023/12:44:14 PM    Final     Assessment/Plan: Diagnosis: 67 year old female with 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  Does the need for close, 24 hr/day medical supervision in concert with the patient's rehab needs make it unreasonable for this  patient to be served in a less intensive setting? Yes Co-Morbidities requiring supervision/potential complications:  -HTN -Acute on chronic renal failure -prior cerebral infarct and hemorrhage -  obesity Due to bladder management, bowel management, safety, skin/wound care, disease management, medication administration, and patient education, does the patient require 24 hr/day rehab nursing? Yes Does the patient require coordinated care of a physician, rehab nurse, therapy disciplines of PT, OT to address physical and functional deficits in the context of the above medical diagnosis(es)? Yes Addressing deficits in the following areas: balance, endurance, locomotion, strength, transferring, bowel/bladder control, bathing, dressing, feeding, grooming, toileting, and psychosocial support Can the patient actively participate in an intensive therapy program of at least 3 hrs of therapy per day at least 5 days per week? Yes The potential for patient to make measurable gains while on inpatient rehab is excellent Anticipated functional outcomes upon discharge from inpatient rehab are modified independent and supervision  with PT, modified independent and supervision with OT, n/a with SLP. Estimated rehab length of stay to reach the above functional goals is: 7-11 days Anticipated discharge destination: Home Overall Rehab/Functional Prognosis: excellent  POST ACUTE RECOMMENDATIONS: This patient's condition is appropriate for continued rehabilitative care in the following setting: CIR Patient has agreed to participate in recommended program. Yes Note that insurance prior authorization may be required for reimbursement for recommended care.  Comment: Rehab Admissions Coordinator to follow up. I believe she can get to mod I for basic needs. Has grandson at home who can help in mornings and at night.       I have personally performed a face to face diagnostic evaluation of this patient. Additionally, I  have examined the patient's medical record including any pertinent labs and radiographic images. If the physician assistant has documented in this note, I have reviewed and edited or otherwise concur with the physician assistant's documentation.  Thanks,  [name], MD 01/30/2023

## 2023-01-30 NOTE — Plan of Care (Signed)
  Problem: Education: Goal: Knowledge of disease or condition will improve Outcome: Progressing Goal: Knowledge of secondary prevention will improve (MUST DOCUMENT ALL) Outcome: Progressing Goal: Knowledge of patient specific risk factors will improve (Mark N/A or DELETE if not current risk factor) Outcome: Progressing   Problem: Intracerebral Hemorrhage Tissue Perfusion: Goal: Complications of Intracerebral Hemorrhage will be minimized Outcome: Progressing   Problem: Coping: Goal: Will verbalize positive feelings about self Outcome: Progressing Goal: Will identify appropriate support needs Outcome: Progressing   Problem: Health Behavior/Discharge Planning: Goal: Ability to manage health-related needs will improve Outcome: Progressing Goal: Goals will be collaboratively established with patient/family Outcome: Progressing   Problem: Self-Care: Goal: Ability to participate in self-care as condition permits will improve Outcome: Progressing Goal: Verbalization of feelings and concerns over difficulty with self-care will improve Outcome: Progressing Goal: Ability to communicate needs accurately will improve Outcome: Progressing   Problem: Nutrition: Goal: Risk of aspiration will decrease Outcome: Progressing Goal: Dietary intake will improve Outcome: Progressing   Problem: Education: Goal: Ability to describe self-care measures that may prevent or decrease complications (Diabetes Survival Skills Education) will improve Outcome: Progressing Goal: Individualized Educational Video(s) Outcome: Progressing   Problem: Coping: Goal: Ability to adjust to condition or change in health will improve Outcome: Progressing   Problem: Fluid Volume: Goal: Ability to maintain a balanced intake and output will improve Outcome: Progressing   Problem: Health Behavior/Discharge Planning: Goal: Ability to identify and utilize available resources and services will improve Outcome:  Progressing Goal: Ability to manage health-related needs will improve Outcome: Progressing   Problem: Metabolic: Goal: Ability to maintain appropriate glucose levels will improve Outcome: Progressing   Problem: Nutritional: Goal: Maintenance of adequate nutrition will improve Outcome: Progressing Goal: Progress toward achieving an optimal weight will improve Outcome: Progressing   Problem: Skin Integrity: Goal: Risk for impaired skin integrity will decrease Outcome: Progressing   Problem: Tissue Perfusion: Goal: Adequacy of tissue perfusion will improve Outcome: Progressing   Problem: Education: Goal: Knowledge of General Education information will improve Description: Including pain rating scale, medication(s)/side effects and non-pharmacologic comfort measures Outcome: Progressing   Problem: Health Behavior/Discharge Planning: Goal: Ability to manage health-related needs will improve Outcome: Progressing   Problem: Clinical Measurements: Goal: Ability to maintain clinical measurements within normal limits will improve Outcome: Progressing Goal: Will remain free from infection Outcome: Progressing Goal: Diagnostic test results will improve Outcome: Progressing Goal: Respiratory complications will improve Outcome: Progressing Goal: Cardiovascular complication will be avoided Outcome: Progressing   Problem: Activity: Goal: Risk for activity intolerance will decrease Outcome: Progressing   Problem: Nutrition: Goal: Adequate nutrition will be maintained Outcome: Progressing   Problem: Coping: Goal: Level of anxiety will decrease Outcome: Progressing   Problem: Elimination: Goal: Will not experience complications related to bowel motility Outcome: Progressing Goal: Will not experience complications related to urinary retention Outcome: Progressing   Problem: Pain Managment: Goal: General experience of comfort will improve Outcome: Progressing   Problem:  Safety: Goal: Ability to remain free from injury will improve Outcome: Progressing   Problem: Skin Integrity: Goal: Risk for impaired skin integrity will decrease Outcome: Progressing   

## 2023-01-30 NOTE — Progress Notes (Signed)
Physical Therapy Treatment Patient Details Name: [name] MRN: [id number] DOB: [birth year] Today's Date: 01/30/2023   History of Present Illness 67 y.o. 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  CT performed which demonstrates a small left intraparenchymal hematoma; SBP 200s; 3/18 with left foot/ankle pain    PT Comments    Patient with new onset left ankle pain limiting her ability to ambulate more than 3 ft from bed to chair. Patient utilizing RW however with poor ability to unweight LLE via bil UEs through RW. Discussed discharge plan and pt only wants to talk about going home despite the fact that she is unable/unsafe to walk by herself at this time. Dr. Erlinda Hong in at end of session and encouraging pt to consider time with rehab prior to home.     Recommendations for follow up therapy are one component of a multi-disciplinary discharge planning process, led by the attending physician.  Recommendations may be updated based on patient status, additional functional criteria and insurance authorization.  Follow Up Recommendations  Acute inpatient rehab (3hours/day)     Assistance Recommended at Discharge Intermittent Supervision/Assistance  Patient can return home with the following Assistance with cooking/housework;Assist for transportation;A lot of help with walking and/or transfers   Equipment Recommendations  None recommended by PT    Recommendations for Other Services Rehab consult     Precautions / Restrictions Precautions Precautions: Fall;Other (comment) Precaution Comments: SBP 130-150 Restrictions Weight Bearing Restrictions: No     Mobility  Bed Mobility Overal bed mobility: Modified Independent             General bed mobility comments: supine to sit; incr time and effort    Transfers Overall transfer level: Needs assistance Equipment used: Rolling walker (2  wheels) Transfers: Sit to/from Stand Sit to Stand: Min assist, +2 physical assistance   Step pivot transfers: Min assist, +2 physical assistance       General transfer comment: pt with difficulty sequencing sit to stand by pushing off bed (wanting to grab RW and pull up); incr assist due to pain in left ankle    Ambulation/Gait Ambulation/Gait assistance: Min assist, +2 safety/equipment Gait Distance (Feet): 3 Feet Assistive device: Rolling walker (2 wheels) Gait Pattern/deviations: Step-to pattern, Decreased stride length, Wide base of support, Decreased weight shift to left, Antalgic   Gait velocity interpretation: <1.31 ft/sec, indicative of household ambulator   General Gait Details: pt limited by left ankle pain with poor WB through LLE; poor use of bil UEs via Rw to [employer] LLE   Stairs             Wheelchair Mobility    Modified Rankin (Stroke Patients Only) Modified Rankin (Stroke Patients Only) Pre-Morbid Rankin Score: Slight disability Modified Rankin: Moderately severe disability     Balance Overall balance assessment: Needs assistance Sitting-balance support: No upper extremity supported, Feet supported Sitting balance-Leahy Scale: Good     Standing balance support: Bilateral upper extremity supported Standing balance-Leahy Scale: Poor                              Cognition Arousal/Alertness: Awake/alert Behavior During Therapy: WFL for tasks assessed/performed Overall Cognitive Status: Within Functional Limits for tasks assessed  Exercises General Exercises - Lower Extremity Ankle Circles/Pumps: AROM, Left, 10 reps    General Comments General comments (skin integrity, edema, etc.): Discussed ankle pain with Dr Erlinda Hong on his arrival. Discussed need for continued rehab prior to home with pt and she only wants to go home.      Pertinent Vitals/Pain Pain Assessment Pain  Assessment: Faces Faces Pain Scale: Hurts whole lot Pain Location: left ankle Pain Descriptors / Indicators: Grimacing, Guarding, Sharp Pain Intervention(s): Limited activity within patient's tolerance, Monitored during session    Home Living                          Prior Function            PT Goals (current goals can now be found in the care plan section) Acute Rehab PT Goals Patient Stated Goal: be able to return home Time For Goal Achievement: 02/11/23 Potential to Achieve Goals: Good Progress towards PT goals: Not progressing toward goals - comment (due to new left ankle pain)    Frequency    Min 4X/week      PT Plan Current plan remains appropriate    Co-evaluation              AM-PAC PT "6 Clicks" Mobility   Outcome Measure  Help needed turning from your back to your side while in a flat bed without using bedrails?: None Help needed moving from lying on your back to sitting on the side of a flat bed without using bedrails?: None Help needed moving to and from a bed to a chair (including a wheelchair)?: Total Help needed standing up from a chair using your arms (e.g., wheelchair or bedside chair)?: Total Help needed to walk in hospital room?: Total Help needed climbing 3-5 steps with a railing? : Total 6 Click Score: 12    End of Session Equipment Utilized During Treatment: Gait belt Activity Tolerance: Patient limited by pain Patient left: in chair;with call bell/phone within reach;with chair alarm set;with family/visitor present Nurse Communication: Mobility status;Other (comment) (limited by left ankle pain) PT Visit Diagnosis: Unsteadiness on feet (R26.81);Difficulty in walking, not elsewhere classified (R26.2)     Time: [id number] PT Time Calculation (min) (ACUTE ONLY): 20 min  Charges:  $Gait Training: 8-22 mins                      [place]  Office [phone]    Rexanne Mano 01/30/2023,  10:02 AM

## 2023-01-30 NOTE — PMR Pre-admission (Signed)
PMR Admission Coordinator Pre-Admission Assessment  Patient: [name] is an 67 y.o., female MRN: [id number] DOB: 05/31/1956 Height: 5\' 2"  (157.5 cm) Weight: 103 kg              Insurance Information HMO: yes    PPO:      PCP:      IPA:      80/20:      OTHER:  PRIMARY: U[employer] Medicare      Policy#: [id number], Medicare: [id number] group [id number]     Subscriber: patient CM Name: online [email or web address]      Phone#: [phone]     Fax#: [phone] Pre-Cert#: Case # [id number]     Benefits:  Phone #:      Name:  Eff. Date: 11/14/2022-11/14/2023     Deduct: $0      Out of Pocket Max: $3,600     ($20 met)  CIR: $295/day co pay for days 1-5, $0/day co pay for days 6+       SNF: $0/day co pay for days 1-20, $203/day co pay for days 21-100 for medicare covered care/maximum 100 days/benefit period Outpatient: $20/visit co pay      Home Health: 100% coverage, limited by medical necessity      DME: 80% coverage, 20% co insurance        The "Data Collection Information Summary" for patients in Inpatient Rehabilitation Facilities with attached "Privacy Act [place] Records" was provided and verbally reviewed with: Patient  Emergency Contact Information Contact Information     Name Relation Home Work [place] Daughter [phone]  [phone]      Current Medical History  Patient Admitting Diagnosis: CVA History of Present Illness:  [name] is a 66 y.o. 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  She presented to [facility] on 01/27/2023 with right sided numbness.  MRI revealed a small left thalamic intraparenchymal hemorrhage and small subacute ischemic infarcts in the left frontoparietal white matter.  Carotid Doppler was negative..  Her systolic blood pressure was in the 200s.  Blood pressure has improved.  81 mg aspirin was held and the plan is to repeat a  head CT in about 2 weeks to help determine whether it can be resumed.  Patient was up with physical therapy yesterday and transferred min assist +2 for sit to stand.  She walked 100 feet with min assist plus 2 hand-held assist with decreased stride length and posterior lean. Therapies are recommending intensive rehab.  Complete NIHSS TOTAL: 1 Glasgow Coma Scale Score: 15  [facility]'s [facility] record from [facility] has been reviewed by the rehabilitation admission coordinator and physician.  Past Medical History  Past Medical History:  Diagnosis Date   Anemia    Blood transfusion 2010   associated with GI bleeding   CKD (chronic kidney disease) stage 2, GFR 60-89 ml/min    Depression    Diabetes mellitus    Diverticulosis of colon with hemorrhage 2010, 09/2011   H/O hiatal hernia    Helicobacter pylori gastritis 09/13/2013   Hx: UTI (urinary tract infection)    Hypertension    Personal history of colonic polyps    adenomas since 2010   Renal cell carcinoma 2004   lt kidney removed   Stroke [facility]) 12/2017   05/2021    Has the patient had major surgery during 100 days prior to admission? No  Family History  family history includes Breast cancer  in her daughter and maternal grandmother; Colon polyps in her mother; Diabetes in her father and maternal grandmother.   Current Medications   Current Facility-Administered Medications:    0.9 %  sodium chloride infusion, , Intravenous, Continuous, Rosalin Hawking, MD, Last Rate: 75 mL/hr at 01/30/23 1348, New Bag at 01/30/23 1348   acetaminophen (TYLENOL) tablet 650 mg, 650 mg, Oral, Q4H PRN **OR** acetaminophen (TYLENOL) 160 MG/5ML solution 650 mg, 650 mg, Per Tube, Q4H PRN **OR** acetaminophen (TYLENOL) suppository 650 mg, 650 mg, Rectal, Q4H PRN, Greta Doom, MD   amLODipine (NORVASC) tablet 10 mg, 10 mg, Oral, Daily, Rosalin Hawking, MD, 10 mg at 01/30/23 0944   carvedilol (COREG) tablet 6.25 mg, 6.25 mg, Oral, BID WC, Rosalin Hawking, MD,  6.25 mg at 01/30/23 [id number]   ceFAZolin (ANCEF) IVPB 1 g/50 mL premix, 1 g, Intravenous, Q8H, Rosalin Hawking, MD, Last Rate: 100 mL/hr at 01/30/23 1435, 1 g at 01/30/23 1435   Chlorhexidine Gluconate Cloth 2 % PADS 6 each, 6 each, Topical, Daily, Greta Doom, MD, 6 each at 01/30/23 1554   colchicine tablet 0.6 mg, 0.6 mg, Oral, Daily, Rosalin Hawking, MD   insulin aspart (novoLOG) injection 0-15 Units, 0-15 Units, Subcutaneous, Q4H, Greta Doom, MD, 2 Units at 01/30/23 0013   labetalol (NORMODYNE) injection 10 mg, 10 mg, Intravenous, Q2H PRN, Rosalin Hawking, MD, 10 mg at 01/30/23 1333   Oral care mouth rinse, 15 mL, Mouth Rinse, PRN, Greta Doom, MD   pantoprazole (PROTONIX) EC tablet 40 mg, 40 mg, Oral, Daily, Rosalin Hawking, MD, 40 mg at 01/30/23 0944   predniSONE (DELTASONE) tablet 40 mg, 40 mg, Oral, Q breakfast, Rosalin Hawking, MD   rosuvastatin (CRESTOR) tablet 20 mg, 20 mg, Oral, Daily, Rosalin Hawking, MD, 20 mg at 01/30/23 0944   senna-docusate (Senokot-S) tablet 1 tablet, 1 tablet, Oral, BID, Greta Doom, MD, 1 tablet at 01/28/23 1041  Patients Current Diet:  Diet Order             Diet heart healthy/carb modified Room service appropriate? Yes; Fluid consistency: Thin  Diet effective now                   Precautions / Restrictions Precautions Precautions: Fall, Other (comment) Precaution Comments: SBP 130-150 Restrictions Weight Bearing Restrictions: No   Has the patient had 2 or more falls or a fall with injury in the past year?No  Prior Activity Level    Prior Functional Level Prior Function Prior Level of Function : Independent/Modified Independent, Driving Mobility Comments: independent ADLs Comments: stands in shower now (used to use BSC in tub to sit); does housework, goes shopping  Self Care: Did the patient need help bathing, dressing, using the toilet or eating?  Independent  Indoor Mobility: Did the patient need assistance with  walking from room to room (with or without device)? Independent  Stairs: Did the patient need assistance with internal or external stairs (with or without device)? Independent  Functional Cognition: Did the patient need help planning regular tasks such as shopping or remembering to take medications? Independent  Patient Information Are you of Hispanic, Latino/a,or Spanish origin?: A. No, not of Hispanic, Latino/a, or Spanish origin What is your race?: B. Black or African American Do you need or want an interpreter to communicate with a doctor or health care staff?: 0. No  Patient's Response To:  Health Literacy and Transportation Is the patient able to respond to health literacy and transportation needs?: Yes  Health Literacy - How often do you need to have someone help you when you read instructions, pamphlets, or other written material from your doctor or pharmacy?: Never In the past 12 months, has lack of transportation kept you from medical appointments or from getting medications?: No In the past 12 months, has lack of transportation kept you from meetings, work, or from getting things needed for daily living?: No  Home Assistive Devices / [place] Devices/Equipment: Environmental consultant (specify type), Raised toilet seat with rails (2 wheel walker) Home Equipment: [place] (2 wheels), BSC/3in1, Adaptive equipment  Prior Device Use: Indicate devices/aids used by the patient prior to current illness, exacerbation or injury? None of the above  Current Functional Level Cognition  Overall Cognitive Status: Within Functional Limits for tasks assessed Orientation Level: Oriented X4 General Comments: Passed Short Blessed Test for recall and problem solving.    Extremity Assessment (includes Sensation/Coordination)  Upper Extremity Assessment: RUE deficits/detail, LUE deficits/detail RUE Deficits / Details: strength 3/5; some incoordination noted; ataxic finger to nose and finger  to thumb opposition LUE Deficits / Details: strength 4/5, AROM is WFLs  Lower Extremity Assessment: Defer to PT evaluation    ADLs  Overall ADL's : Needs assistance/impaired Eating/Feeding: Set up, Sitting Eating/Feeding Details (indicate cue type and reason): able to open containers Grooming: Set up, Sitting Grooming Details (indicate cue type and reason): on EOB Upper Body Bathing: Minimal assistance, Sitting Lower Body Bathing: Minimal assistance, Sitting/lateral leans, Sit to/from stand, Cueing for safety Upper Body Dressing : Minimal assistance, Sitting Lower Body Dressing: Moderate assistance, Sitting/lateral leans Lower Body Dressing Details (indicate cue type and reason): to donn socks, required assistance to get socks over toes Toilet Transfer: Min guard, Cueing for safety Toileting- Clothing Manipulation and Hygiene: Min guard, Sitting/lateral lean, Bed level Functional mobility during ADLs: Min guard, Cueing for safety, Cueing for sequencing General ADL Comments: limited to EOB due to LLE ankle pain    Mobility  Overal bed mobility: Modified Independent General bed mobility comments: able to get to EOB and back to supine without assistance    Transfers  Overall transfer level: Needs assistance Equipment used: Rolling walker (2 wheels) Transfers: Sit to/from Stand Sit to Stand: Min assist, +2 physical assistance Bed to/from chair/wheelchair/BSC transfer type:: Step pivot Step pivot transfers: Min assist, +2 physical assistance General transfer comment: declined due to LLE ankle pain    Ambulation / Gait / Stairs / Wheelchair Mobility  Ambulation/Gait Ambulation/Gait assistance: Min assist, +2 safety/equipment Gait Distance (Feet): 3 Feet Assistive device: Rolling walker (2 wheels) Gait Pattern/deviations: Step-to pattern, Decreased stride length, Wide base of support, Decreased weight shift to left, Antalgic General Gait Details: pt limited by left ankle pain with  poor WB through LLE; poor use of bil UEs via Rw to unweight LLE Gait velocity interpretation: <1.31 ft/sec, indicative of household ambulator    Posture / Balance Balance Overall balance assessment: Needs assistance Sitting-balance support: No upper extremity supported, Feet supported Sitting balance-Leahy Scale: Good Standing balance support: Bilateral upper extremity supported Standing balance-Leahy Scale: Poor Standing balance comment: posterior imbalance in static standing    Special needs/care consideration Skin ***     Previous Home Environment (from acute therapy documentation) Living Arrangements: Other relatives  Lives With: Family (grandson) Available Help at Discharge: Family, Available PRN/intermittently Type of Home: Apartment Home Layout: One level Home Access: Level entry Bathroom Shower/Tub: Chiropodist: Standard Home Care Services: No  Discharge Living Setting Plans for Discharge Living Setting:  Patient's home, Lives with (comment), Apartment (Grandson at home at night) Type of Home at Discharge: Apartment Discharge Home Layout: One level Discharge Home Access: Level entry Discharge Bathroom Shower/Tub: Tub/shower unit Discharge Bathroom Toilet: Standard Does the patient have any problems obtaining your medications?: No  Social/Family/Support Systems Anticipated Caregiver: Grandson Anticipated Ambulance person Information: patient did not want daughter contacted, or grandson Ability/Limitations of Caregiver: works during the day, is available at night Caregiver Availability: Evenings only Discharge Plan Discussed with Primary Caregiver: No Is Caregiver In Agreement with Plan?: No Does Caregiver/Family have Issues with Lodging/Transportation while Pt is in Rehab?: No   Goals Patient/Family Goal for Rehab: Mod I PT/OT Expected length of stay: 7-11 days Pt/Family Agrees to Admission and willing to participate: Yes Program  Orientation Provided & Reviewed with Pt/Caregiver Including Roles  & Responsibilities: Yes  Barriers to Discharge: Insurance for SNF coverage   Decrease burden of Care through IP rehab admission: Othern/a   Possible need for SNF placement upon discharge:not anticipated   Patient Condition: This patient's condition remains as documented in the consult dated 01/30/23, in which the Rehabilitation Physician determined and documented that the patient's condition is appropriate for intensive rehabilitative care in an inpatient rehabilitation facility. Will admit to inpatient rehab today.  Preadmission Screen Completed By:  Nelly Laurence, 01/30/2023 4:09 PM ______________________________________________________________________   Discussed status with Dr. Marland Kitchenon***at *** and received approval for admission today.  Admission Coordinator:  Nelly Laurence, time***/Date***

## 2023-01-30 NOTE — Progress Notes (Signed)
  Transition of Care [facility]) Screening Note   Patient Details  Name: [name] Date of Birth: 10-01-1956   Transition of Care [facility]) CM/SW Contact:    Pollie Friar, RN Phone Number: 01/30/2023, 3:02 PM   Pt is from home with her grandson. Awaiting OT to re-eval. CIR to start insurance for rehab admission. Transition of Care Department [facility]) has reviewed patient. We will continue to monitor patient advancement through interdisciplinary progression rounds. If new patient transition needs arise, please place a TOC consult.

## 2023-01-30 NOTE — Progress Notes (Addendum)
STROKE TEAM PROGRESS NOTE   INTERVAL HISTORY Patient is seen in her room with 1 family member at the bedside.   Per RN and PT patient was very unsteady on her feet this morning and she is now complaining of left ankle pain.  Will obtain x-ray and ultrasound of left lower leg as well as consult Ortho Recommendations are now for acute inpatient rehab which patient seems amendable to Still waiting on transfer to floor bed Neurological exam is stable vital signs are stable Serum creatinine is improved to 1.7 this a.m. continue to encourage fluids  Vitals:   01/30/23 0400 01/30/23 0757 01/30/23 0800 01/30/23 1132  BP: 134/84  (!) 146/74   Pulse: 76  82   Resp: 12  16   Temp: 98.3 F (36.8 C) 98.6 F (37 C)  98.3 F (36.8 C)  TempSrc: Oral Oral  Oral  SpO2: 96%  98%   Weight:      Height:       CBC:  Recent Labs  Lab 01/27/23 1647 01/29/23 0323  WBC 7.7 6.7  NEUTROABS 4.1  --   HGB 13.7 12.0  HCT 43.7 39.0  MCV 92.2 90.5  PLT 235 [id number]    Basic Metabolic Panel:  Recent Labs  Lab 01/29/23 0323 01/30/23 0658  NA 138 135  K 4.1 4.0  CL 107 104  CO2 24 24  GLUCOSE 113* 105*  BUN 32* 32*  CREATININE 2.10* 1.78*  CALCIUM 8.5* 8.4*    Lipid Panel:  Recent Labs  Lab 01/29/23 0323  CHOL 242*  TRIG 115  HDL 48  CHOLHDL 5.0  VLDL 23  LDLCALC 171*    HgbA1c:  Recent Labs  Lab 01/28/23 0226  HGBA1C 7.1*   Urine Drug Screen:  Recent Labs  Lab 01/28/23 1841  LABOPIA NONE DETECTED  COCAINSCRNUR NONE DETECTED  LABBENZ NONE DETECTED  AMPHETMU NONE DETECTED  THCU NONE DETECTED  LABBARB NONE DETECTED     Alcohol Level No results for input(s): "ETH" in the last 168 hours.  IMAGING past 24 hours DG Ankle Complete Left  Result Date: 01/30/2023 CLINICAL DATA:  Left anterior ankle pain for 1 day, initial encounter EXAM: LEFT ANKLE COMPLETE - 3+ VIEW COMPARISON:  None Available. FINDINGS: No acute fracture or dislocation is noted. Mild tarsal degenerative changes  are seen. Mild soft tissue swelling is noted. IMPRESSION: Mild soft tissue swelling without acute bony abnormality. Electronically Signed   By: Inez Catalina M.D.   On: 01/30/2023 10:46    PHYSICAL EXAM General: Alert, well-nourished, well-developed patient in no acute distress Respiratory: Regular, unlabored respirations on room air  NEURO:  Mental Status: AA&Ox3  Speech/Language: speech is without dysarthria or aphasia.   Cranial Nerves:  II: PERRL. Visual fields full.  III, IV, VI: EOMI. Eyelids elevate symmetrically.  V: Sensation is intact to light touch but diminished on right side of face VII: Smile is symmetrical.  VIII: hearing intact to voice. IX, X:Phonation is normal.  [id number] shrug 5/5. XII: tongue is midline without fasciculations. Motor: 5/5 strength to all muscle groups tested.  Tone: is normal and bulk is normal Sensation- Intact to light touch bilaterally but diminished in left arm, symmetrical in legs Coordination: FTN intact bilaterally.No drift.  Gait- deferred    ASSESSMENT/PLAN Ms. Erilynn Hyden is a 67 y.o. 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  She also had a transient  episode of diplopia.  The tingling improved, but the numbness was persistent so she sought care in the ED and was found to have a small left thalamic IPH.  She was noted to be quite hypertensive on arrival with systolics in the [id number], but this has improved.  ICH score 0  ICH:  left thalamic ICH, likely Hypertension Stroke: left frontoparietal WM infarcts, likely small vessel disease CT head small IPH in left thalamus MRI stable small left thalamic IPH, small subacute ischemic infarcts in left frontoparietal white matter MRA no LVO.  Cranial atherosclerosis small M2 severe long segment stenosis.  Severe stenosis within right M2 progressive to moderate to severe stenosis of  left posterior P2 2 mm inferiorly projecting vascular protrusion arising from the cavernous left internal carotid artery may reflect an aneurysm Carotid Doppler negative 2D Echo 60 to 65%  LDL 171 HgbA1c 7.1 UDS negative VTE prophylaxis -SCDs No antithrombotic prior to admission, now on No antithrombotic secondary to IPH.  Consider restarting 81 mg aspirin in 2 weeks after repeat CT head Therapy recommendations: CIR Disposition: Pending  History of ICH and stroke 12/2017 admitted for fall with left-sided weakness.  CT showed right BG ICH.  CT head and neck unremarkable.  EF 60 to 65%.  Carotid Doppler negative.  UDS negative.  LDL 118.  With residual left-sided numbness 08/2019 follow-up with [facility], put on aspirin 81. 05/2021 admitted for right thalamic infarct.  MRI showed right left M2 occlusion and left M2 stenosis.  EF 60 to 65%, LDL 152, A1c 7.2, discharged on DAPT and Lipitor 80.  Hypertension Home meds: coreg 6.25 mg daily, lisinopril-hydrochlorothiazide 20-12.5 mg daily Wean off Cleviprex if able Resume home coreg Start amlodipine 10 mg daily Hold off ACEI and diuretics due to elevated Cre BP goal less than 160 Long-term BP goal normotensive  Hyperlipidemia Home meds: None LDL 152, goal < 70 Put on crestor 20 Initiate statin at discharge  Diabetes type II Controlled Home meds: Exenatide 2 mg weekly HgbA1c 7.1, goal < 7.0 CBGs SSI Close PCP follow-up as outpatient.  AKI on CKD 3a creatinine 1.72->2.10->1.78 Encourage po intake Continue IVF @ 75 Monitor BMP in am  Left ankle pain / swollen Left ankle pain -x-ray with mild soft tissue swelling without acute bony abnormality  Ultrasound left lower extremity- pending Ortho consulted, concerning for gout but not rule out cellulitis  Will start gout treatment - colchicine and prednisone Will start ancef 1g Q8h as recommended.  Close monitoring  other Stroke Risk Factors Advanced Age >/= 63  Obesity, Body mass index  is 41.52 kg/m., BMI >/= 30 associated with increased stroke risk, recommend weight loss, diet and exercise as appropriate   Other Active Problems Renal cell carcinoma status post left nephrectomy IBS  Hospital day # [place] DNP, ACNPC-AG  [facility]  ATTENDING NOTE: I reviewed above note and agree with the assessment and plan. Pt was seen and examined.   Daughter at the bedside. Pt sitting in chair, complaining of left ankle pain. PT/OT worked with her and reported difficulty walking with left foot due to pain. On exam, left ankle mild swelling with mild tenderness to touch, no redness. X-ray no fracture and LE venous doppler pending. Ortho on board, concerning for gout but not rule out cellulitis. Will treat for both. Pending CIR.   For detailed assessment and plan, please refer to above/below as I have made changes wherever appropriate.   Rosalin Hawking, MD PhD Stroke Neurology 01/30/2023  1:52 PM    To contact Stroke Continuity provider, please refer to [email or web address] After hours, contact General Neurology

## 2023-01-30 NOTE — Progress Notes (Signed)
Occupational Therapy Treatment Patient Details Name: [name] MRN: [id number] DOB: May 06, 1956 Today's Date: 01/30/2023   History of present illness 67 y.o. 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  CT performed which demonstrates a small left intraparenchymal hematoma; SBP 200s; 3/18 with left foot/ankle pain   OT comments  Patient limited this visit due to LLE ankle pain. Patient able to get to EOB without assistance and required assistance with donning socks over toes and was able to complete. Patient declined standing or transfers due to ankle pain. Patient provided yellow therapy band and instructions on BUE strengthening to increase functional use with ADLs and activity tolerance. Discharge recommendations changed from [facility] to [facility] due to requiring increased rehab for mobility and transfers and would benefit from continued OT to increase independence and safety with self care. Acute OT to continue to follow.    Recommendations for follow up therapy are one component of a multi-disciplinary discharge planning process, led by the attending physician.  Recommendations may be updated based on patient status, additional functional criteria and insurance authorization.    Follow Up Recommendations  Acute inpatient rehab (3hours/day)     Assistance Recommended at Discharge Set up Supervision/Assistance  Patient can return home with the following  A little help with walking and/or transfers;Assistance with cooking/housework;Assist for transportation   Equipment Recommendations  BSC/3in1    Recommendations for Other Services      Precautions / Restrictions Precautions Precautions: Fall;Other (comment) Restrictions Weight Bearing Restrictions: No       Mobility Bed Mobility Overal bed mobility: Modified Independent             General bed mobility comments: able to get to EOB  and back to supine without assistance    Transfers                   General transfer comment: declined due to LLE ankle pain     Balance Overall balance assessment: Needs assistance Sitting-balance support: No upper extremity supported, Feet supported Sitting balance-Leahy Scale: Good                                     ADL either performed or assessed with clinical judgement   ADL Overall ADL's : Needs assistance/impaired Eating/Feeding: Set up;Sitting Eating/Feeding Details (indicate cue type and reason): able to open containers Grooming: Set up;Sitting Grooming Details (indicate cue type and reason): on EOB             Lower Body Dressing: Moderate assistance;Sitting/lateral leans Lower Body Dressing Details (indicate cue type and reason): to donn socks, required assistance to get socks over toes               General ADL Comments: limited to EOB due to LLE ankle pain    Extremity/Trunk Assessment              Vision       Perception     Praxis      Cognition Arousal/Alertness: Awake/alert Behavior During Therapy: WFL for tasks assessed/performed Overall Cognitive Status: Within Functional Limits for tasks assessed  Exercises Exercises: General Upper Extremity General Exercises - Upper Extremity Shoulder Flexion: Strengthening, Both, 10 reps, Seated, Theraband Theraband Level (Shoulder Flexion): Level 1 (Yellow) Shoulder Horizontal ABduction: Strengthening, Both, 10 reps, Seated, Theraband Theraband Level (Shoulder Horizontal Abduction): Level 1 (Yellow) Elbow Flexion: Strengthening, 10 reps, Seated, Theraband, Both Theraband Level (Elbow Flexion): Level 1 (Yellow) Elbow Extension: Strengthening, Both, 10 reps, Seated, Theraband Theraband Level (Elbow Extension): Level 1 (Yellow)    Shoulder Instructions       General Comments limited by LLE ankle pain     Pertinent Vitals/ Pain       Pain Assessment Pain Assessment: Faces Faces Pain Scale: Hurts whole lot Pain Location: left ankle Pain Descriptors / Indicators: Grimacing, Guarding, Sharp Pain Intervention(s): Limited activity within patient's tolerance, Monitored during session, Repositioned  Home Living Family/patient expects to be discharged to:: Inpatient rehab Living Arrangements: Other relatives Available Help at Discharge: Family;Available PRN/intermittently Type of Home: Apartment Home Access: Level entry     Home Layout: One level     Bathroom Shower/Tub: Teacher, early years/pre: Standard     Home Equipment: Conservation officer, nature (2 wheels);BSC/3in1;Adaptive equipment      Lives With: Family (grandson)    Prior Functioning/Environment              Frequency  Min 2X/week        Progress Toward Goals  OT Goals(current goals can now be found in the care plan section)  Progress towards OT goals: Progressing toward goals  Acute Rehab OT Goals Patient Stated Goal: get better OT Goal Formulation: With patient Time For Goal Achievement: 02/11/23 Potential to Achieve Goals: Good ADL Goals Additional ADL Goal #1: Pt will increase to supervisionA overall for OOB ADL. Additional ADL Goal #2: Pt will state 3 energy conservation techniques to assist in increasing activity tolerance.  Plan Discharge plan remains appropriate    Co-evaluation                 AM-PAC OT "6 Clicks" Daily Activity     Outcome Measure   Help from another person eating meals?: None Help from another person taking care of personal grooming?: A Little Help from another person toileting, which includes using toliet, bedpan, or urinal?: A Lot Help from another person bathing (including washing, rinsing, drying)?: A Lot Help from another person to put on and taking off regular upper body clothing?: A Little Help from another person to put on and taking off regular lower  body clothing?: A Lot 6 Click Score: 16    End of Session    OT Visit Diagnosis: Unsteadiness on feet (R26.81);Muscle weakness (generalized) (M62.81)   Activity Tolerance Patient limited by pain   Patient Left in bed;with call bell/phone within reach;with bed alarm set   Nurse Communication Mobility status        Time: [id number] OT Time Calculation (min): 23 min  Charges: OT General Charges $OT Visit: 1 Visit OT Treatments $Self Care/Home Management : 8-22 mins $Therapeutic Exercise: 8-22 mins  Lodema Hong, [place]  Office [phone]   Trixie Dredge 01/30/2023, 3:06 PM

## 2023-01-31 ENCOUNTER — Encounter (HOSPITAL_COMMUNITY): Payer: Self-pay | Admitting: Neurology

## 2023-01-31 ENCOUNTER — Encounter (HOSPITAL_COMMUNITY): Payer: Self-pay | Admitting: Physical Medicine & Rehabilitation

## 2023-01-31 ENCOUNTER — Inpatient Hospital Stay (HOSPITAL_COMMUNITY): Payer: Medicare Other

## 2023-01-31 ENCOUNTER — Inpatient Hospital Stay (HOSPITAL_COMMUNITY)
Admission: RE | Admit: 2023-01-31 | Discharge: 2023-02-10 | DRG: 057 | Disposition: A | Discharge status: Home Health | Payer: Medicare Other | Source: Intra-hospital | Attending: Physical Medicine & Rehabilitation | Admitting: Physical Medicine & Rehabilitation

## 2023-01-31 ENCOUNTER — Other Ambulatory Visit: Payer: Self-pay

## 2023-01-31 DIAGNOSIS — E785 Hyperlipidemia, unspecified: Secondary | ICD-10-CM | POA: Diagnosis present

## 2023-01-31 DIAGNOSIS — I160 Hypertensive urgency: Secondary | ICD-10-CM | POA: Diagnosis present

## 2023-01-31 DIAGNOSIS — N182 Chronic kidney disease, stage 2 (mild): Secondary | ICD-10-CM | POA: Diagnosis present

## 2023-01-31 DIAGNOSIS — Z6841 Body Mass Index (BMI) 40.0 and over, adult: Secondary | ICD-10-CM | POA: Diagnosis not present

## 2023-01-31 DIAGNOSIS — I130 Hypertensive heart and chronic kidney disease with heart failure and stage 1 through stage 4 chronic kidney disease, or unspecified chronic kidney disease: Secondary | ICD-10-CM | POA: Diagnosis present

## 2023-01-31 DIAGNOSIS — Z9049 Acquired absence of other specified parts of digestive tract: Secondary | ICD-10-CM

## 2023-01-31 DIAGNOSIS — E1151 Type 2 diabetes mellitus with diabetic peripheral angiopathy without gangrene: Secondary | ICD-10-CM | POA: Diagnosis present

## 2023-01-31 DIAGNOSIS — I2510 Atherosclerotic heart disease of native coronary artery without angina pectoris: Secondary | ICD-10-CM | POA: Diagnosis present

## 2023-01-31 DIAGNOSIS — I10 Essential (primary) hypertension: Secondary | ICD-10-CM | POA: Diagnosis not present

## 2023-01-31 DIAGNOSIS — K219 Gastro-esophageal reflux disease without esophagitis: Secondary | ICD-10-CM | POA: Diagnosis present

## 2023-01-31 DIAGNOSIS — M25511 Pain in right shoulder: Secondary | ICD-10-CM | POA: Diagnosis not present

## 2023-01-31 DIAGNOSIS — I69198 Other sequelae of nontraumatic intracerebral hemorrhage: Secondary | ICD-10-CM

## 2023-01-31 DIAGNOSIS — Z905 Acquired absence of kidney: Secondary | ICD-10-CM | POA: Diagnosis not present

## 2023-01-31 DIAGNOSIS — I509 Heart failure, unspecified: Secondary | ICD-10-CM | POA: Diagnosis present

## 2023-01-31 DIAGNOSIS — F32A Depression, unspecified: Secondary | ICD-10-CM | POA: Diagnosis present

## 2023-01-31 DIAGNOSIS — I878 Other specified disorders of veins: Secondary | ICD-10-CM | POA: Diagnosis present

## 2023-01-31 DIAGNOSIS — L03116 Cellulitis of left lower limb: Secondary | ICD-10-CM | POA: Diagnosis present

## 2023-01-31 DIAGNOSIS — E1122 Type 2 diabetes mellitus with diabetic chronic kidney disease: Secondary | ICD-10-CM | POA: Diagnosis present

## 2023-01-31 DIAGNOSIS — Z83719 Family history of colon polyps, unspecified: Secondary | ICD-10-CM

## 2023-01-31 DIAGNOSIS — R2689 Other abnormalities of gait and mobility: Secondary | ICD-10-CM | POA: Diagnosis present

## 2023-01-31 DIAGNOSIS — M109 Gout, unspecified: Secondary | ICD-10-CM | POA: Diagnosis present

## 2023-01-31 DIAGNOSIS — R52 Pain, unspecified: Secondary | ICD-10-CM

## 2023-01-31 DIAGNOSIS — I610 Nontraumatic intracerebral hemorrhage in hemisphere, subcortical: Secondary | ICD-10-CM

## 2023-01-31 DIAGNOSIS — Z87440 Personal history of urinary (tract) infections: Secondary | ICD-10-CM

## 2023-01-31 DIAGNOSIS — Z7985 Long-term (current) use of injectable non-insulin antidiabetic drugs: Secondary | ICD-10-CM

## 2023-01-31 DIAGNOSIS — Z9851 Tubal ligation status: Secondary | ICD-10-CM

## 2023-01-31 DIAGNOSIS — R202 Paresthesia of skin: Secondary | ICD-10-CM | POA: Diagnosis present

## 2023-01-31 DIAGNOSIS — Z86010 Personal history of colonic polyps: Secondary | ICD-10-CM

## 2023-01-31 DIAGNOSIS — Z833 Family history of diabetes mellitus: Secondary | ICD-10-CM

## 2023-01-31 DIAGNOSIS — R0981 Nasal congestion: Secondary | ICD-10-CM | POA: Diagnosis present

## 2023-01-31 DIAGNOSIS — Z79899 Other long term (current) drug therapy: Secondary | ICD-10-CM | POA: Diagnosis not present

## 2023-01-31 DIAGNOSIS — Z8619 Personal history of other infectious and parasitic diseases: Secondary | ICD-10-CM

## 2023-01-31 DIAGNOSIS — I6930 Unspecified sequelae of cerebral infarction: Secondary | ICD-10-CM | POA: Diagnosis present

## 2023-01-31 DIAGNOSIS — I618 Other nontraumatic intracerebral hemorrhage: Secondary | ICD-10-CM | POA: Diagnosis present

## 2023-01-31 DIAGNOSIS — Z5189 Encounter for other specified aftercare: Secondary | ICD-10-CM

## 2023-01-31 DIAGNOSIS — N179 Acute kidney failure, unspecified: Secondary | ICD-10-CM

## 2023-01-31 DIAGNOSIS — M10372 Gout due to renal impairment, left ankle and foot: Secondary | ICD-10-CM | POA: Diagnosis not present

## 2023-01-31 DIAGNOSIS — R200 Anesthesia of skin: Secondary | ICD-10-CM | POA: Diagnosis present

## 2023-01-31 DIAGNOSIS — E669 Obesity, unspecified: Secondary | ICD-10-CM

## 2023-01-31 DIAGNOSIS — Z90710 Acquired absence of both cervix and uterus: Secondary | ICD-10-CM

## 2023-01-31 DIAGNOSIS — I63312 Cerebral infarction due to thrombosis of left middle cerebral artery: Secondary | ICD-10-CM

## 2023-01-31 DIAGNOSIS — Z85528 Personal history of other malignant neoplasm of kidney: Secondary | ICD-10-CM

## 2023-01-31 DIAGNOSIS — M10072 Idiopathic gout, left ankle and foot: Secondary | ICD-10-CM

## 2023-01-31 DIAGNOSIS — I619 Nontraumatic intracerebral hemorrhage, unspecified: Principal | ICD-10-CM | POA: Diagnosis present

## 2023-01-31 DIAGNOSIS — E1169 Type 2 diabetes mellitus with other specified complication: Secondary | ICD-10-CM

## 2023-01-31 LAB — BASIC METABOLIC PANEL
Anion gap: 12 (ref 5–15)
BUN: 38 mg/dL — ABNORMAL HIGH (ref 8–23)
CO2: 20 mmol/L — ABNORMAL LOW (ref 22–32)
Calcium: 8.3 mg/dL — ABNORMAL LOW (ref 8.9–10.3)
Chloride: 103 mmol/L (ref 98–111)
Creatinine, Ser: 1.98 mg/dL — ABNORMAL HIGH (ref 0.44–1.00)
GFR, Estimated: 28 mL/min — ABNORMAL LOW (ref >60)
Glucose, Bld: 195 mg/dL — ABNORMAL HIGH (ref 70–99)
Potassium: 4.2 mmol/L (ref 3.5–5.1)
Sodium: 135 mmol/L (ref 135–145)

## 2023-01-31 LAB — GLUCOSE, CAPILLARY
Glucose-Capillary: 174 mg/dL — ABNORMAL HIGH (ref 70–99)
Glucose-Capillary: 125 mg/dL — ABNORMAL HIGH (ref 70–99)
Glucose-Capillary: 123 mg/dL — ABNORMAL HIGH (ref 70–99)
Glucose-Capillary: 217 mg/dL — ABNORMAL HIGH (ref 70–99)
Glucose-Capillary: 217 mg/dL — ABNORMAL HIGH (ref 70–99)

## 2023-01-31 MED ORDER — CARVEDILOL 6.25 MG PO TABS
6.2500 mg | ORAL_TABLET | Freq: Two times a day (BID) | ORAL | Status: DC
  Administered 2023-01-31 – 2023-02-05 (×10): 6.25 mg via ORAL
  Filled 2023-01-31 (×10): qty 1

## 2023-01-31 MED ORDER — CEFAZOLIN SODIUM-DEXTROSE 1-4 GM/50ML-% IV SOLN
1.0000 g | Freq: Three times a day (TID) | INTRAVENOUS | Status: DC
  Administered 2023-01-31: 1 g via INTRAVENOUS
  Filled 2023-01-31 (×3): qty 50

## 2023-01-31 MED ORDER — PANTOPRAZOLE SODIUM 40 MG PO TBEC: 40.0000 mg | DELAYED_RELEASE_TABLET | Freq: Every day | ORAL | 0 refills | Status: DC

## 2023-01-31 MED ORDER — PREDNISONE 20 MG PO TABS
40.0000 mg | ORAL_TABLET | Freq: Every day | ORAL | Status: AC
  Administered 2023-02-01: 40 mg via ORAL
  Filled 2023-01-31: qty 2

## 2023-01-31 MED ORDER — AMLODIPINE BESYLATE 10 MG PO TABS: 10.0000 mg | ORAL_TABLET | Freq: Every day | ORAL | 1 refills | Status: DC

## 2023-01-31 MED ORDER — DIPHENHYDRAMINE HCL 12.5 MG/5ML PO ELIX: 12.5000 mg | ORAL_SOLUTION | Freq: Four times a day (QID) | ORAL | Status: DC | PRN

## 2023-01-31 MED ORDER — FLEET ENEMA 7-19 GM/118ML RE ENEM: 1.0000 | ENEMA | Freq: Once | RECTAL | Status: DC | PRN

## 2023-01-31 MED ORDER — ALUM & MAG HYDROXIDE-SIMETH 200-200-20 MG/5ML PO SUSP: 30.0000 mL | ORAL | Status: DC | PRN

## 2023-01-31 MED ORDER — ROSUVASTATIN CALCIUM 20 MG PO TABS: 20.0000 mg | ORAL_TABLET | Freq: Every day | ORAL | 1 refills | Status: DC

## 2023-01-31 MED ORDER — PANTOPRAZOLE SODIUM 40 MG PO TBEC
40.0000 mg | DELAYED_RELEASE_TABLET | Freq: Every day | ORAL | Status: DC
  Administered 2023-02-01 – 2023-02-10 (×10): 40 mg via ORAL
  Filled 2023-01-31 (×10): qty 1

## 2023-01-31 MED ORDER — TRAZODONE HCL 50 MG PO TABS
25.0000 mg | ORAL_TABLET | Freq: Every evening | ORAL | Status: DC | PRN
  Administered 2023-02-01 – 2023-02-09 (×3): 50 mg via ORAL
  Filled 2023-01-31 (×3): qty 1

## 2023-01-31 MED ORDER — ACETAMINOPHEN 325 MG PO TABS: 650.0000 mg | ORAL_TABLET | ORAL | 0 refills | Status: DC | PRN

## 2023-01-31 MED ORDER — PROCHLORPERAZINE EDISYLATE 10 MG/2ML IJ SOLN: 5.0000 mg | Freq: Four times a day (QID) | INTRAMUSCULAR | Status: DC | PRN

## 2023-01-31 MED ORDER — PROCHLORPERAZINE MALEATE 5 MG PO TABS: 5.0000 mg | ORAL_TABLET | Freq: Four times a day (QID) | ORAL | Status: DC | PRN

## 2023-01-31 MED ORDER — METHOCARBAMOL 500 MG PO TABS
500.0000 mg | ORAL_TABLET | Freq: Four times a day (QID) | ORAL | Status: DC | PRN
  Filled 2023-01-31: qty 1

## 2023-01-31 MED ORDER — CEFAZOLIN SODIUM-DEXTROSE 1-4 GM/50ML-% IV SOLN: 1.0000 g | Freq: Three times a day (TID) | INTRAVENOUS | 0 refills | Status: DC

## 2023-01-31 MED ORDER — COLCHICINE 0.6 MG PO TABS: 0.6000 mg | ORAL_TABLET | Freq: Every day | ORAL | 0 refills | Status: DC

## 2023-01-31 MED ORDER — ACETAMINOPHEN 325 MG PO TABS
325.0000 mg | ORAL_TABLET | ORAL | Status: DC | PRN
  Filled 2023-01-31: qty 2

## 2023-01-31 MED ORDER — INSULIN ASPART 100 UNIT/ML IJ SOLN
0.0000 [IU] | INTRAMUSCULAR | Status: DC
  Administered 2023-01-31 – 2023-02-01 (×2): 5 [IU] via SUBCUTANEOUS
  Administered 2023-02-01: 3 [IU] via SUBCUTANEOUS

## 2023-01-31 MED ORDER — COLCHICINE 0.6 MG PO TABS
0.6000 mg | ORAL_TABLET | Freq: Every day | ORAL | Status: DC
  Administered 2023-02-01 – 2023-02-10 (×10): 0.6 mg via ORAL
  Filled 2023-01-31 (×10): qty 1

## 2023-01-31 MED ORDER — SORBITOL 70 % SOLN: 30.0000 mL | Freq: Every day | Status: DC | PRN

## 2023-01-31 MED ORDER — SODIUM CHLORIDE 0.9 % IV SOLN: INTRAVENOUS | Status: DC

## 2023-01-31 MED ORDER — PREDNISONE 20 MG PO TABS: 40.0000 mg | ORAL_TABLET | Freq: Every day | ORAL | 0 refills | Status: DC

## 2023-01-31 MED ORDER — GUAIFENESIN-DM 100-10 MG/5ML PO SYRP: 5.0000 mL | ORAL_SOLUTION | Freq: Four times a day (QID) | ORAL | Status: DC | PRN

## 2023-01-31 MED ORDER — PROCHLORPERAZINE 25 MG RE SUPP: 12.5000 mg | Freq: Four times a day (QID) | RECTAL | Status: DC | PRN

## 2023-01-31 MED ORDER — POLYETHYLENE GLYCOL 3350 17 G PO PACK: 17.0000 g | PACK | Freq: Every day | ORAL | Status: DC | PRN

## 2023-01-31 MED ORDER — ROSUVASTATIN CALCIUM 20 MG PO TABS
20.0000 mg | ORAL_TABLET | Freq: Every day | ORAL | Status: DC
  Administered 2023-02-01 – 2023-02-05 (×5): 20 mg via ORAL
  Filled 2023-01-31 (×5): qty 1

## 2023-01-31 MED ORDER — AMLODIPINE BESYLATE 10 MG PO TABS
10.0000 mg | ORAL_TABLET | Freq: Every day | ORAL | Status: DC
  Administered 2023-02-01 – 2023-02-10 (×10): 10 mg via ORAL
  Filled 2023-01-31 (×10): qty 1

## 2023-01-31 MED ORDER — SENNOSIDES-DOCUSATE SODIUM 8.6-50 MG PO TABS: 1.0000 | ORAL_TABLET | Freq: Two times a day (BID) | ORAL | 0 refills | Status: DC

## 2023-01-31 NOTE — Progress Notes (Signed)
[name], MD  Physician Physical Medicine and Rehabilitation   PMR Pre-admission    Signed   Date of Service: 01/30/2023  2:04 PM  Related encounter: ED to Hosp-Admission (Discharged) from 01/27/2023 in [facility]   Signed      Show:Clear all [x] Written[x] Templated[x] Copied  Added by: [x] Monica Gong, RN[x] [name]  [] Hover for details PMR Admission Coordinator Pre-Admission Assessment   Patient: [name] is an 67 y.o., female MRN: [id number] DOB: 08-03-1956 Height: 5\' 2"  (157.5 cm) Weight: 103 kg                                                                                                                                                  Insurance Information HMO: yes    PPO:      PCP:      IPA:      80/20:      OTHER:  PRIMARY: UHC Medicare      Policy#: [id number], Medicare: [id number] group [id number]     Subscriber: patient CM Name: [name]      Phone#: [phone]     Fax#: [phone] Pre-Cert#: Case # [id number]  approved 3/19 until 02/06/23 Benefits:  Phone #:      Name:  Eff. Date: 11/14/2022-11/14/2023     Deduct: $0      Out of Pocket Max: $3,600     ($20 met)  CIR: $295/day co pay for days 1-5, $0/day co pay for days 6+       SNF: $0/day co pay for days 1-20, $203/day co pay for days 21-100 for medicare covered care/maximum 100 days/benefit period Outpatient: $20/visit co pay      Home Health: 100% coverage, limited by medical necessity      DME: 80% coverage, 20% co insurance         The "Data Collection Information Summary" for patients in Inpatient Rehabilitation Facilities with attached "Privacy Act [place] Records" was provided and verbally reviewed with: Patient   Emergency Contact Information Contact Information       Name Relation Home Work [name] Daughter [phone]   [phone]         Current Medical History  Patient Admitting Diagnosis: CVA    History of Present Illness:  [name] is a 67 y.o. 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  She presented to [facility] on 01/27/2023 with right sided numbness.  MRI revealed a small left thalamic intraparenchymal hemorrhage and small subacute ischemic infarcts in the left frontoparietal white matter.  Carotid Doppler was negative..  Her systolic blood pressure was in the 200s.  Blood pressure has improved.  81 mg aspirin was held and the plan is to repeat a head CT in about 2 weeks to  help determine whether it can be resumed.  Patient was up with physical therapy yesterday and transferred min assist +2 for sit to stand.  She walked 100 feet with min assist plus 2 hand-held assist with decreased stride length and posterior lean. Therapies are recommending intensive rehab.    Complete NIHSS TOTAL: 1 Glasgow Coma Scale Score: 15   Patient's [facility] record from [facility] has been reviewed by the rehabilitation admission coordinator and physician.   Past Medical History      Past Medical History:  Diagnosis Date   Anemia     Blood transfusion 2010    associated with GI bleeding   CAD (coronary artery disease)     CHF (congestive heart failure) (HCC)     CKD (chronic kidney disease) stage 2, GFR 60-89 ml/min     Depression     Diverticulosis of colon with hemorrhage 2010, 09/2011   DM2 (diabetes mellitus, type 2) ([place])     Gout 01/30/2023    Left Foot   H/O hiatal hernia     Helicobacter pylori gastritis 09/13/2013   Hemorrhagic stroke ([place]) 2019    Right Basal Ganglia   Hx: UTI (urinary tract infection)     Hypertension     Intraparenchymal hemorrhage of brain ([place]) 01/27/2023   Morbid obesity with BMI of 40.0-44.9, adult ([place])     Personal history of colonic polyps      adenomas since 2010   PVD (peripheral vascular disease) ([place])     Renal cell carcinoma 2004    lt kidney removed    Stroke [facility]) 12/2017    05/2021   Thalamic stroke ([place]) 05/2021    Right   Venous stasis      Has the patient had major surgery during 100 days prior to admission? No   Family History  family history includes Breast cancer in her daughter and maternal grandmother; Colon polyps in her mother; Diabetes in her father and maternal grandmother.   Current Medications    Current Facility-Administered Medications:    0.9 %  sodium chloride infusion, , Intravenous, Continuous, Monica Hawking, MD, Last Rate: 75 mL/hr at 01/31/23 0600, Infusion Verify at 01/31/23 0600   acetaminophen (TYLENOL) tablet 650 mg, 650 mg, Oral, Q4H PRN **OR** acetaminophen (TYLENOL) 160 MG/5ML solution 650 mg, 650 mg, Per Tube, Q4H PRN **OR** acetaminophen (TYLENOL) suppository 650 mg, 650 mg, Rectal, Q4H PRN, Monica Doom, MD   amLODipine (NORVASC) tablet 10 mg, 10 mg, Oral, Daily, Monica Hawking, MD, 10 mg at 01/31/23 0915   carvedilol (COREG) tablet 6.25 mg, 6.25 mg, Oral, BID WC, Monica Hawking, MD, 6.25 mg at 01/31/23 0915   ceFAZolin (ANCEF) IVPB 1 g/50 mL premix, 1 g, Intravenous, Q8H, Monica Hawking, MD, Stopped at 01/31/23 0510   colchicine tablet 0.6 mg, 0.6 mg, Oral, Daily, Monica Hawking, MD, 0.6 mg at 01/31/23 0915   insulin aspart (novoLOG) injection 0-15 Units, 0-15 Units, Subcutaneous, Q4H, Monica Doom, MD, 2 Units at 01/31/23 0913   labetalol (NORMODYNE) injection 10 mg, 10 mg, Intravenous, Q2H PRN, Monica Hawking, MD, 10 mg at 01/30/23 1333   Oral care mouth rinse, 15 mL, Mouth Rinse, PRN, Monica Doom, MD   pantoprazole (PROTONIX) EC tablet 40 mg, 40 mg, Oral, Daily, Monica Hawking, MD, 40 mg at 01/31/23 0915   predniSONE (DELTASONE) tablet 40 mg, 40 mg, Oral, Q breakfast, Monica Hawking, MD, 40 mg at 01/31/23 0915   rosuvastatin (CRESTOR) tablet 20 mg, 20 mg, Oral,  Daily, Monica Hawking, MD, 20 mg at 01/31/23 [id number]   senna-docusate (Senokot-S) tablet 1 tablet, 1 tablet, Oral, BID, Monica Doom, MD, 1 tablet at 01/30/23 2154   Patients Current Diet:  Diet Order                  Diet heart healthy/carb modified Room service appropriate? Yes; Fluid consistency: Thin  Diet effective now                       Precautions / Restrictions Precautions Precautions: Fall, Other (comment) Precaution Comments: SBP 130-150 Restrictions Weight Bearing Restrictions: No    Has the patient had 2 or more falls or a fall with injury in the past year?No   Prior Activity Level Community (5-7x/wk): independent and driving   Prior Functional Level Prior Function Prior Level of Function : Independent/Modified Independent, Driving Mobility Comments: independent ADLs Comments: stands in shower now (used to use BSC in tub to sit); does housework, goes shopping   Self Care: Did the patient need help bathing, dressing, using the toilet or eating?  Independent   Indoor Mobility: Did the patient need assistance with walking from room to room (with or without device)? Independent   Stairs: Did the patient need assistance with internal or external stairs (with or without device)? Independent   Functional Cognition: Did the patient need help planning regular tasks such as shopping or remembering to take medications? Independent   Patient Information Are you of Hispanic, Latino/a,or Spanish origin?: A. No, not of Hispanic, Latino/a, or Spanish origin What is your race?: B. Black or African American Do you need or want an interpreter to communicate with a doctor or health care staff?: 0. No   Patient's Response To:  Health Literacy and Transportation Is the patient able to respond to health literacy and transportation needs?: Yes Health Literacy - How often do you need to have someone help you when you read instructions, pamphlets, or other written material from your doctor or pharmacy?: Never In the past 12 months, has lack of transportation kept you from medical appointments or from  getting medications?: No In the past 12 months, has lack of transportation kept you from meetings, work, or from getting things needed for daily living?: No   Home Assistive Devices / [place] Devices/Equipment: Environmental consultant (specify type), Raised toilet seat with rails (2 wheel walker) Home Equipment: [place] (2 wheels), BSC/3in1, Adaptive equipment   Prior Device Use: Indicate devices/aids used by the patient prior to current illness, exacerbation or injury? None of the above   Current Functional Level Cognition   Overall Cognitive Status: Within Functional Limits for tasks assessed Orientation Level: Oriented X4 General Comments: Passed Short Blessed Test for recall and problem solving.    Extremity Assessment (includes Sensation/Coordination)   Upper Extremity Assessment: RUE deficits/detail, LUE deficits/detail RUE Deficits / Details: strength 3/5; some incoordination noted; ataxic finger to nose and finger to thumb opposition LUE Deficits / Details: strength 4/5, AROM is WFLs  Lower Extremity Assessment: Defer to PT evaluation     ADLs   Overall ADL's : Needs assistance/impaired Eating/Feeding: Set up, Sitting Eating/Feeding Details (indicate cue type and reason): able to open containers Grooming: Set up, Sitting Grooming Details (indicate cue type and reason): on EOB Upper Body Bathing: Minimal assistance, Sitting Lower Body Bathing: Minimal assistance, Sitting/lateral leans, Sit to/from stand, Cueing for safety Upper Body Dressing : Minimal assistance, Sitting Lower Body Dressing: Moderate assistance, Sitting/lateral  leans Lower Body Dressing Details (indicate cue type and reason): to donn socks, required assistance to get socks over toes Toilet Transfer: Min guard, Cueing for safety Toileting- Clothing Manipulation and Hygiene: Min guard, Sitting/lateral lean, Bed level Functional mobility during ADLs: Min guard, Cueing for safety, Cueing for  sequencing General ADL Comments: limited to EOB due to LLE ankle pain     Mobility   Overal bed mobility: Modified Independent General bed mobility comments: able to get to EOB and back to supine without assistance     Transfers   Overall transfer level: Needs assistance Equipment used: Rolling walker (2 wheels) Transfers: Sit to/from Stand Sit to Stand: Min assist, +2 physical assistance Bed to/from chair/wheelchair/BSC transfer type:: Step pivot Step pivot transfers: Min assist, +2 physical assistance General transfer comment: declined due to LLE ankle pain     Ambulation / Gait / Stairs / Wheelchair Mobility   Ambulation/Gait Ambulation/Gait assistance: Min assist, +2 safety/equipment Gait Distance (Feet): 3 Feet Assistive device: Rolling walker (2 wheels) Gait Pattern/deviations: Step-to pattern, Decreased stride length, Wide base of support, Decreased weight shift to left, Antalgic General Gait Details: pt limited by left ankle pain with poor WB through LLE; poor use of bil UEs via Rw to unweight LLE Gait velocity interpretation: <1.31 ft/sec, indicative of household ambulator     Posture / Balance Balance Overall balance assessment: Needs assistance Sitting-balance support: No upper extremity supported, Feet supported Sitting balance-Leahy Scale: Good Standing balance support: Bilateral upper extremity supported Standing balance-Leahy Scale: Poor Standing balance comment: posterior imbalance in static standing     Special needs/care consideration Hgb A1c 7.1      Previous Home Environment  Living Arrangements: Other relatives  Lives With: Family (grandson) Available Help at Discharge: Family, Available PRN/intermittently Type of Home: Apartment Home Layout: One level Home Access: Level entry Bathroom Shower/Tub: Chiropodist: Standard Home Care Services: No   Discharge Living Setting Plans for Discharge Living Setting: Patient's home, Lives  with (comment), Apartment (Grandson at home at night) Type of Home at Discharge: Apartment Discharge Home Layout: One level Discharge Home Access: Level entry Discharge Bathroom Shower/Tub: Tub/shower unit Discharge Bathroom Toilet: Standard Does the patient have any problems obtaining your medications?: No   Social/Family/Support Systems Anticipated Caregiver: Arts development officer Information: patient did not want daughter contacted, or grandson Ability/Limitations of Caregiver: works during the day, is available at night Caregiver Availability: Evenings only Discharge Plan Discussed with Primary Caregiver: No Is Caregiver In Agreement with Plan?: No Does Caregiver/Family have Issues with Lodging/Transportation while Pt is in Rehab?: No   Grandson works second shift at [employer]. Does not got to school. He is 66 years old.   Goals Patient/Family Goal for Rehab: Mod I PT/OT Expected length of stay: 7-11 days Pt/Family Agrees to Admission and willing to participate: Yes Program Orientation Provided & Reviewed with Pt/Caregiver Including Roles  & Responsibilities: Yes  Barriers to Discharge: Insurance for SNF coverage   Decrease burden of Care through IP rehab admission: n/a   Possible need for SNF placement upon discharge:not anticipated   Patient Condition: This patient's condition remains as documented in the consult dated 01/30/23, in which the Rehabilitation Physician determined and documented that the patient's condition is appropriate for intensive rehabilitative care in an inpatient rehabilitation facility. Will admit to inpatient rehab today.   Preadmission Screen Completed [id number] [place] with updates by  Cleatrice Burke, RN, 01/31/2023 11:23 AM ______________________________________________________________________   Discussed status with Dr. Naaman Plummer on  01/31/23 at 1123 and received approval for admission today.   Admission Coordinator:  Amanda Cockayne with updates by Cleatrice Burke, time [phone] Date3/19/24            Revision History

## 2023-01-31 NOTE — Progress Notes (Signed)
LLE venous duplex has been completed.   Results can be found under chart review under CV PROC. 01/31/2023 12:05 PM Lakecia Deschamps RVT, RDMS

## 2023-01-31 NOTE — H&P (Signed)
Physical Medicine and Rehabilitation Admission H&P     CC: Functional deficits secondary to left thalamic ICH etiology likely due to hypertensive urgency as well as small subacute ischemic infarcts in left frontoparietal white matter    HPI: [name] is a 67 year old female who presented to the ED on 01/27/2023 complaining of tingling and numbness of her right side similar to symptoms she had from previous stroke. History of hypertension on HCTZ and lisinopril. CT head consistent with thalamic intraparenchymal hemorrhage. Neurology consulted. Systolic BP range in [id number]. Admitted for BP control and further work-up. 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 AKI with serum creatinine to 2.10. IVFs NS at 75 cc/hr continues. A1c 7.1%. Inpatient diabetic coordinator consulted. She develop acute right foot pain and orthopedic surgery was consulted. Palin films obtained. Started on prednisone and colchicine for acute gout. Added Ancef for possible cellulitis. Light touch diminished in left arm. No antithrombotic prior to admission, now on no antithrombotic secondary to IPH.  Consider restarting 81 mg aspirin in 2 weeks after repeat CT head. Now on amlodipine and carvedilol. The patient requires inpatient physical medicine and rehabilitation evaluations and treatment secondary to dysfunction due to [place] and small subacute left ischemic infarcts.   PMH: History of renal cell carcinoma s/p left nephrectomy. S/p incisional hernia repair with mesh 01/2022. Prior stroke history: right BG ICH in 12/2017. 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 MRI showed right left M2 occlusion and left M2 stenosis. Discharged home on DAPT and Lipitor.   Review of Systems  Constitutional:  Negative for fever.  HENT:  Negative for hearing loss.   Eyes:  Negative for blurred vision and double vision.  Respiratory:  Negative for cough.   Cardiovascular:  Negative for  chest pain and palpitations.  Gastrointestinal:  Negative for heartburn and nausea.  Genitourinary:  Negative for dysuria.  Musculoskeletal:  Negative for myalgias.  Skin:  Negative for rash.  Neurological:  Positive for sensory change, focal weakness and headaches.  Psychiatric/Behavioral:  Negative for depression.         Past Medical History:  Diagnosis Date   Anemia     Blood transfusion 2010    associated with GI bleeding   CAD (coronary artery disease)     CHF (congestive heart failure) (HCC)     CKD (chronic kidney disease) stage 2, GFR 60-89 ml/min     Depression     Diverticulosis of colon with hemorrhage 2010, 09/2011   DM2 (diabetes mellitus, type 2) ([place])     Gout 01/30/2023    Left Foot   H/O hiatal hernia     Helicobacter pylori gastritis 09/13/2013   Hemorrhagic stroke ([place]) 2019    Right Basal Ganglia   Hx: UTI (urinary tract infection)     Hypertension     Intraparenchymal hemorrhage of brain ([place]) 01/27/2023   Morbid obesity with BMI of 40.0-44.9, adult ([place])     Personal history of colonic polyps      adenomas since 2010   PVD (peripheral vascular disease) ([place])     Renal cell carcinoma 2004    lt kidney removed   Stroke [facility]) 12/2017    05/2021   Thalamic stroke ([place]) 05/2021    Right   Venous stasis           Past Surgical History:  Procedure Laterality Date   ABDOMINAL HYSTERECTOMY   2004   BIOPSY   10/23/2019  Procedure: BIOPSY;  Surgeon: Lavena Bullion, DO;  Location: [facility];  Service: Gastroenterology;;   CHOLECYSTECTOMY       COLONOSCOPY   12/19/2008    diverticulosis   COLONOSCOPY   11/17/2011    Procedure: COLONOSCOPY;  Surgeon: Zenovia Jarred, MD;  Location: [facility];  Service: Gastroenterology;  Laterality: N/A;   DILATION AND CURETTAGE OF UTERUS N/A 07/11/2014    Procedure: Repair of Vaginal Cuff;  Surgeon: Melina Schools, MD;  Location: [facility];  Service: Gynecology;  Laterality: N/A;   ESOPHAGOGASTRODUODENOSCOPY (EGD)  WITH PROPOFOL N/A 10/23/2019    Procedure: ESOPHAGOGASTRODUODENOSCOPY (EGD) WITH PROPOFOL;  Surgeon: Lavena Bullion, DO;  Location: [facility];  Service: Gastroenterology;  Laterality: N/A;   FLEXIBLE SIGMOIDOSCOPY   12/12/2008    diverticulosis   HAND SURGERY        x5 right   HYDRADENITIS EXCISION Right 03/21/2014    Procedure: EXCISION HIDRADENITIS AXILLA;  Surgeon: Pedro Earls, MD;  Location: [facility];  Service: General;  Laterality: Right;   KNEE ARTHROSCOPY        x2 left   NEPHRECTOMY   2013    left   TRIGGER FINGER RELEASE Left 06/23/2015    Procedure: RELEASE TRIGGER FINGER/A-1 PULLEY LEFT RING FINGER;  Surgeon: Daryll Brod, MD;  Location: [facility];  Service: Orthopedics;  Laterality: Left;   TUBAL LIGATION   1980   UPPER GASTROINTESTINAL ENDOSCOPY   12/12/2008    gastroporesis   XI ROBOTIC ASSISTED VENTRAL HERNIA N/A 01/21/2022    Procedure: ROBOTIC INCISIONAL HERNIA REPAIR WITH MESH, BILATERAL POSTERIOR RECTUS MYOFASCIAL RELEASE;  Surgeon: Felicie Morn, MD;  Location: [facility];  Service: General;  Laterality: N/A;         Family History  Problem Relation Age of Onset   Colon polyps Mother     Diabetes Maternal Grandmother     Breast cancer Maternal Grandmother     Diabetes Father     Breast cancer Daughter     Colon cancer Neg Hx      Social History:  reports that she has never smoked. She has never used smokeless tobacco. She reports that she does not drink alcohol and does not use drugs. Allergies: No Known Allergies       Medications Prior to Admission  Medication Sig Dispense Refill   albuterol (VENTOLIN HFA) 108 (90 Base) MCG/ACT inhaler Inhale 2 puffs into the lungs every 6 (six) hours as needed for wheezing.       Exenatide ER 2 MG PEN Inject 2 mg into the skin every Saturday.       fluticasone (FLONASE) 50 MCG/ACT nasal spray Place 1 spray into both nostrils daily as needed for allergies.        lisinopril-hydrochlorothiazide (ZESTORETIC) 20-12.5 MG tablet Take 2 tablets by mouth daily.       carvedilol (COREG) 6.25 MG tablet Take 6.25 mg by mouth 2 (two) times daily with a meal. (Patient not taking: Reported on 01/27/2023)              Home: Home Living Family/patient expects to be discharged to:: Inpatient rehab Living Arrangements: Other relatives Available Help at Discharge: Family, Available PRN/intermittently Type of Home: Apartment Home Access: Level entry Home Layout: One level Bathroom Shower/Tub: Chiropodist: Standard Home Equipment: Conservation officer, nature (2 wheels), BSC/3in1, Radiation protection practitioner Equipment: Reacher  Lives With: Family (grandson)   Functional History: Prior Function Prior Level of Function :  Independent/Modified Independent, Driving Mobility Comments: independent ADLs Comments: stands in shower now (used to use BSC in tub to sit); does housework, goes shopping   Functional Status:  Mobility: Bed Mobility Overal bed mobility: Modified Independent General bed mobility comments: able to get to EOB and back to supine without assistance Transfers Overall transfer level: Needs assistance Equipment used: Rolling walker (2 wheels) Transfers: Sit to/from Stand Sit to Stand: Min assist, +2 physical assistance Bed to/from chair/wheelchair/BSC transfer type:: Step pivot Step pivot transfers: Min assist, +2 physical assistance General transfer comment: declined due to LLE ankle pain Ambulation/Gait Ambulation/Gait assistance: Min assist, +2 safety/equipment Gait Distance (Feet): 3 Feet Assistive device: Rolling walker (2 wheels) Gait Pattern/deviations: Step-to pattern, Decreased stride length, Wide base of support, Decreased weight shift to left, Antalgic General Gait Details: pt limited by left ankle pain with poor WB through LLE; poor use of bil UEs via Rw to unweight LLE Gait velocity interpretation: <1.31 ft/sec, indicative  of household ambulator   ADL: ADL Overall ADL's : Needs assistance/impaired Eating/Feeding: Set up, Sitting Eating/Feeding Details (indicate cue type and reason): able to open containers Grooming: Set up, Sitting Grooming Details (indicate cue type and reason): on EOB Upper Body Bathing: Minimal assistance, Sitting Lower Body Bathing: Minimal assistance, Sitting/lateral leans, Sit to/from stand, Cueing for safety Upper Body Dressing : Minimal assistance, Sitting Lower Body Dressing: Moderate assistance, Sitting/lateral leans Lower Body Dressing Details (indicate cue type and reason): to donn socks, required assistance to get socks over toes Toilet Transfer: Min guard, Cueing for safety Toileting- Clothing Manipulation and Hygiene: Min guard, Sitting/lateral lean, Bed level Functional mobility during ADLs: Min guard, Cueing for safety, Cueing for sequencing General ADL Comments: limited to EOB due to LLE ankle pain   Cognition: Cognition Overall Cognitive Status: Within Functional Limits for tasks assessed Orientation Level: Oriented X4 Cognition Arousal/Alertness: Awake/alert Behavior During Therapy: WFL for tasks assessed/performed Overall Cognitive Status: Within Functional Limits for tasks assessed General Comments: Passed Short Blessed Test for recall and problem solving.   Physical Exam: Blood pressure (!) 159/71, pulse 82, temperature 98.2 F (36.8 C), temperature source Oral, resp. rate 18, height 5\' 2"  (1.575 m), weight 103 kg, last menstrual period 11/16/2002, SpO2 100 %. Physical Exam Vitals reviewed.  Constitutional:      General: She is not in acute distress.    Appearance: She is obese.  HENT:     Head: Normocephalic and atraumatic.     Right Ear: External ear normal.     Left Ear: External ear normal.     Nose: Nose normal.     Mouth/Throat:     Mouth: Mucous membranes are moist.  Eyes:     Extraocular Movements: Extraocular movements intact.     Pupils:  Pupils are equal, round, and reactive to light.  Cardiovascular:     Rate and Rhythm: Normal rate and regular rhythm.     Heart sounds: No murmur heard.    No gallop.  Pulmonary:     Effort: Pulmonary effort is normal. No respiratory distress.     Breath sounds: No wheezing.  Abdominal:     General: Bowel sounds are normal. There is no distension.     Palpations: Abdomen is soft.     Tenderness: There is no abdominal tenderness.  Musculoskeletal:        General: Swelling and tenderness (left forefoot (improved from yesterday)) present.     Cervical back: Normal range of motion and neck supple.  Skin:  General: Skin is warm and dry.  Neurological:     Mental Status: She is alert.     Comments: Alert and oriented x 3. Normal insight and awareness. Intact Memory. Normal language and speech. Cranial nerve exam unremarkable mild right perioral sensory loss. MMT: RUE 4-/5 prox to 4+/5 distally. LUE 5/5. RLE 3+/5 prox to 4+distal. LLE 4- prox to 4+ distally. Decreased LT right arm and to a lesser extent left forearm. Near normal LT and pain sense RLE. No abnormal resting tone. DTR's 1+    Psychiatric:        Mood and Affect: Mood normal.        Behavior: Behavior normal.        Lab Results Last 48 Hours        Results for orders placed or performed during the hospital encounter of 01/27/23 (from the past 48 hour(s))  Glucose, capillary     Status: None    Collection Time: 01/29/23 11:54 AM  Result Value Ref Range    Glucose-Capillary 88 70 - 99 mg/dL      Comment: Glucose reference range applies only to samples taken after fasting for at least 8 hours.    Comment 1 Glucose Stabilizer    Glucose, capillary     Status: Abnormal    Collection Time: 01/29/23  4:34 PM  Result Value Ref Range    Glucose-Capillary 106 (H) 70 - 99 mg/dL      Comment: Glucose reference range applies only to samples taken after fasting for at least 8 hours.  Glucose, capillary     Status: Abnormal     Collection Time: 01/29/23  7:57 PM  Result Value Ref Range    Glucose-Capillary 184 (H) 70 - 99 mg/dL      Comment: Glucose reference range applies only to samples taken after fasting for at least 8 hours.  Glucose, capillary     Status: Abnormal    Collection Time: 01/29/23 11:52 PM  Result Value Ref Range    Glucose-Capillary 123 (H) 70 - 99 mg/dL      Comment: Glucose reference range applies only to samples taken after fasting for at least 8 hours.  Glucose, capillary     Status: Abnormal    Collection Time: 01/30/23  3:47 AM  Result Value Ref Range    Glucose-Capillary 110 (H) 70 - 99 mg/dL      Comment: Glucose reference range applies only to samples taken after fasting for at least 8 hours.  Basic metabolic panel     Status: Abnormal    Collection Time: 01/30/23  6:58 AM  Result Value Ref Range    Sodium 135 135 - 145 mmol/L    Potassium 4.0 3.5 - 5.1 mmol/L    Chloride 104 98 - 111 mmol/L    CO2 24 22 - 32 mmol/L    Glucose, Bld 105 (H) 70 - 99 mg/dL      Comment: Glucose reference range applies only to samples taken after fasting for at least 8 hours.    BUN 32 (H) 8 - 23 mg/dL    Creatinine, Ser 1.78 (H) 0.44 - 1.00 mg/dL    Calcium 8.4 (L) 8.9 - 10.3 mg/dL    GFR, Estimated 31 (L) >60 mL/min      Comment: (NOTE) Calculated using the CKD-EPI Creatinine Equation (2021)      Anion gap 7 5 - 15      Comment: Performed at [facility]  [address] [place], [place] [address]  Glucose, capillary     Status: None    Collection Time: 01/30/23  7:51 AM  Result Value Ref Range    Glucose-Capillary 83 70 - 99 mg/dL      Comment: Glucose reference range applies only to samples taken after fasting for at least 8 hours.  Glucose, capillary     Status: Abnormal    Collection Time: 01/30/23 11:22 AM  Result Value Ref Range    Glucose-Capillary 121 (H) 70 - 99 mg/dL      Comment: Glucose reference range applies only to samples taken after fasting for at least 8 hours.   Glucose, capillary     Status: Abnormal    Collection Time: 01/30/23  5:16 PM  Result Value Ref Range    Glucose-Capillary 192 (H) 70 - 99 mg/dL      Comment: Glucose reference range applies only to samples taken after fasting for at least 8 hours.  Glucose, capillary     Status: Abnormal    Collection Time: 01/30/23  8:41 PM  Result Value Ref Range    Glucose-Capillary 206 (H) 70 - 99 mg/dL      Comment: Glucose reference range applies only to samples taken after fasting for at least 8 hours.    Comment 1 Notify RN    Glucose, capillary     Status: Abnormal    Collection Time: 01/30/23 11:20 PM  Result Value Ref Range    Glucose-Capillary 208 (H) 70 - 99 mg/dL      Comment: Glucose reference range applies only to samples taken after fasting for at least 8 hours.  Basic metabolic panel     Status: Abnormal    Collection Time: 01/31/23  2:41 AM  Result Value Ref Range    Sodium 135 135 - 145 mmol/L    Potassium 4.2 3.5 - 5.1 mmol/L    Chloride 103 98 - 111 mmol/L    CO2 20 (L) 22 - 32 mmol/L    Glucose, Bld 195 (H) 70 - 99 mg/dL      Comment: Glucose reference range applies only to samples taken after fasting for at least 8 hours.    BUN 38 (H) 8 - 23 mg/dL    Creatinine, Ser 1.98 (H) 0.44 - 1.00 mg/dL    Calcium 8.3 (L) 8.9 - 10.3 mg/dL    GFR, Estimated 28 (L) >60 mL/min      Comment: (NOTE) Calculated using the CKD-EPI Creatinine Equation (2021)      Anion gap 12 5 - 15      Comment: Performed at [facility] [address] [place], [place] [address]  Glucose, capillary     Status: Abnormal    Collection Time: 01/31/23  3:25 AM  Result Value Ref Range    Glucose-Capillary 174 (H) 70 - 99 mg/dL      Comment: Glucose reference range applies only to samples taken after fasting for at least 8 hours.  Glucose, capillary     Status: Abnormal    Collection Time: 01/31/23  8:18 AM  Result Value Ref Range    Glucose-Capillary 125 (H) 70 - 99 mg/dL      Comment: Glucose  reference range applies only to samples taken after fasting for at least 8 hours.    Comment 1 Notify RN      Comment 2 Document in Chart         Imaging Results (Last 48 hours)  DG  Ankle Complete Left   Result Date: 01/30/2023 CLINICAL DATA:  Left anterior ankle pain for 1 day, initial encounter EXAM: LEFT ANKLE COMPLETE - 3+ VIEW COMPARISON:  None Available. FINDINGS: No acute fracture or dislocation is noted. Mild tarsal degenerative changes are seen. Mild soft tissue swelling is noted. IMPRESSION: Mild soft tissue swelling without acute bony abnormality. Electronically Signed   By: Inez Catalina M.D.   On: 01/30/2023 10:46           Blood pressure (!) 159/71, pulse 82, temperature 98.2 F (36.8 C), temperature source Oral, resp. rate 18, height 5\' 2"  (1.575 m), weight 103 kg, last menstrual period 11/16/2002, SpO2 100 %.   Medical Problem List and Plan: 1. Functional deficits secondary to left thalamic ICH d/t hypertension with additional subacute infarcts in the left fronto-parietal white matter infarcts d/t small vessel disease             -patient may shower             -ELOS/Goals: 7-11 days, mod I to supervision   2.  Antithrombotics: -DVT/anticoagulation:  Mechanical:  Antiembolism stockings, knee (TED hose) Bilateral lower extremities             -antiplatelet therapy:  Consider restarting 81 mg aspirin in 2 weeks after repeat CT head   3. Pain Management: Tylenol as needed   4. Mood/Behavior/Sleep: LCSW to evaluate and provide emotional support             -antipsychotic agents: n/a   5. Neuropsych/cognition: This patient is capable of making decisions on her own behalf.   6. Skin/Wound Care: Routine skin care checks   7. Fluids/Electrolytes/Nutrition: Routine Is and Os and follow-up chemistries   8: Hypertension: monitor TID and prn             -continue Coreg 6.25 mg BID             -continue Norvasc 10 mg daily             -bp currently borderline. May be related  to prednisone 9: Hyperlipidemia: continue statin   10: Acute left forefoot gout +/- cellulitis: already appears improved today -continue colchicine -continue prednisone 40 mg daily through 3/21 -Ancef 1 mg q 8 hours through 3/23. Need? This doesn't appear to be cellulitis             -consider MRI with and without contrast if no improvement per ortho   11: GERD/HH/hx of GI bleed, H. pylori: continue Protonix 40 mg daily   12: DM: CBGs q AC and q HS; home med [place] 2 mg q Saturday             -continue carb modified diet;  prednisone elevating cbg's             -per diabetic manager 3/19: change Novolog correction to 0-9 units from 0-15 and consider Novolog 2 units TID meal coverage if eats >50% meal     13: AKI atop CKD stage II (unknown SCr baseline). History of renal cell carcinoma s/p left nephrectomy             -continue IVFs and follow-up BMP             -encourage PO liquids   14: Class 3 obesity: BMI = 41.52       Barbie Banner, PA-C 01/31/2023  I have personally performed a face to face diagnostic evaluation of this patient and formulated the key  components of the plan.  Additionally, I have personally reviewed laboratory data, imaging studies, as well as relevant notes and concur with the physician assistant's documentation above.  The patient's status has not changed from the original H&P.  Any changes in documentation from the acute care chart have been noted above.  [name], MD, Mellody Drown

## 2023-01-31 NOTE — Care Management Important Message (Signed)
Important Message  Patient Details  Name: [name] MRN: [id number] Date of Birth: March 09, 1956   Medicare Important Message Given:  Yes     [name] Montine Circle 01/31/2023, 1:26 PM

## 2023-01-31 NOTE — Progress Notes (Signed)
Inpatient Rehabilitation Admission Medication Review by a Pharmacist  A complete drug regimen review was completed for this patient to identify any potential clinically significant medication issues.  High Risk Drug Classes Is patient taking? Indication by Medication  Antipsychotic Yes Compazine - N/V  Anticoagulant No   Antibiotic Yes Cefazolin - cellulitis  Opioid No   Antiplatelet No Consider restarting ASA in 2 wks after CT  Hypoglycemics/insulin Yes SSI - DM  Vasoactive Medication Yes Amlodipine/Coreg - HTN  Chemotherapy No   Other Yes Colchine - gout Robaxin - spasms Protonix - Gerd Prednisone - gout Crestor - lipid Trazodone - sleep     Type of Medication Issue Identified Description of Issue Recommendation(s)  Drug Interaction(s) (clinically significant)     Duplicate Therapy     Allergy     No Medication Administration End Date     Incorrect Dose     Additional Drug Therapy Needed     Significant med changes from prior encounter (inform family/care partners about these prior to discharge).    Other  Albuterol, exenatide, flonase Resume at dc if needed    Clinically significant medication issues were identified that warrant physician communication and completion of prescribed/recommended actions by midnight of the next day:  No  Name of provider notified for urgent issues identified:   Provider Method of Notification:     Pharmacist comments:   Time spent performing this drug regimen review (minutes):  [place], PharmD, [place], AAHIVP, CPP Infectious Disease Pharmacist 01/31/2023 4:31 PM

## 2023-01-31 NOTE — TOC Transition Note (Signed)
Transition of Care [facility]) - CM/SW Discharge Note   Patient Details  Name: [name] MRN: [id number] Date of Birth: 1956-08-08  [facility] CM/SW Contact:  Pollie Friar, RN Phone Number: 01/31/2023, 11:54 AM   Clinical Narrative:    Pt is discharging to CIR today. CM signing off.    Final next level of care: [place]     Patient Goals and CMS Choice CMS Medicare.gov Compare Post Acute Care list provided to:: Patient Choice offered to / list presented to : Patient  Discharge Placement                         Discharge Plan and Services Additional resources added to the After Visit Summary for                                       Social Determinants of Health (SDOH) Interventions SDOH Screenings   Food Insecurity: No Food Insecurity (01/30/2023)  Housing: Low Risk  (01/30/2023)  Transportation Needs: No Transportation Needs (01/30/2023)  Utilities: Not At Risk (01/30/2023)  Depression (PHQ2-9): Low Risk  (05/28/2021)  Financial Resource Strain: Low Risk  (05/28/2021)  Social Connections: Moderately Integrated (05/28/2021)  Stress: No Stress Concern Present (05/28/2021)  Tobacco Use: Low Risk  (01/31/2023)     Readmission Risk Interventions     No data to display

## 2023-01-31 NOTE — Progress Notes (Signed)
Monica Staggers, MD  Physician Physical Medicine and Rehabilitation   Consult Note    Signed   Date of Service: 01/30/2023  9:57 AM  Related encounter: ED to Hosp-Admission (Discharged) from 01/27/2023 in [facility]   Signed     Expand All Collapse All  Show:Clear all [x] Written[x] Templated[] Copied  Added by: [x] Monica Staggers, MD  [] Hover for details          Physical Medicine and Rehabilitation Consult Reason for Consult:right sided weakness and numbness Referring Physician: Erlinda Hong     HPI: [name] is a 67 y.o. 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  She presented on 01/27/2023 with right sided numbness.  MRI revealed a small left thalamic intraparenchymal hemorrhage and small subacute ischemic infarcts in the left frontoparietal white matter.  Carotid Doppler was negative..  Her systolic blood pressure was in the 200s.  Blood pressure has improved.  81 mg aspirin was held and the plan is to repeat a head CT in about 2 weeks to help determine whether it can be resumed.  Patient was up with physical therapy yesterday and transferred min assist +2 for sit to stand.  She walked 100 feet with min assist plus 2 hand-held assist with decreased stride length and posterior lean.     Review of Systems  Constitutional:  Negative for chills and fever.  HENT:  Negative for hearing loss.   Eyes:  Negative for blurred vision and double vision.  Respiratory: Negative.    Cardiovascular: Negative.   Gastrointestinal:  Negative for nausea and vomiting.  Genitourinary:  Negative for dysuria.  Musculoskeletal:  Negative for myalgias.  Skin:  Negative for rash.  Neurological:  Positive for dizziness, tingling, sensory change and focal weakness.  Psychiatric/Behavioral:  Negative for depression and suicidal ideas.         Past Medical History:  Diagnosis Date   Anemia      Blood transfusion 2010    associated with GI bleeding   CKD (chronic kidney disease) stage 2, GFR 60-89 ml/min     Depression     Diabetes mellitus     Diverticulosis of colon with hemorrhage 2010, 09/2011   H/O hiatal hernia     Helicobacter pylori gastritis 09/13/2013   Hx: UTI (urinary tract infection)     Hypertension     Personal history of colonic polyps      adenomas since 2010   Renal cell carcinoma 2004    lt kidney removed   Stroke [facility]) 12/2017    05/2021         Past Surgical History:  Procedure Laterality Date   ABDOMINAL HYSTERECTOMY   2004   BIOPSY   10/23/2019    Procedure: BIOPSY;  Surgeon: Lavena Bullion, DO;  Location: [facility];  Service: Gastroenterology;;   CHOLECYSTECTOMY       COLONOSCOPY   12/19/2008    diverticulosis   COLONOSCOPY   11/17/2011    Procedure: COLONOSCOPY;  Surgeon: Zenovia Jarred, MD;  Location: [facility];  Service: Gastroenterology;  Laterality: N/A;   DILATION AND CURETTAGE OF UTERUS N/A 07/11/2014    Procedure: Repair of Vaginal Cuff;  Surgeon: Melina Schools, MD;  Location: [facility];  Service: Gynecology;  Laterality: N/A;   ESOPHAGOGASTRODUODENOSCOPY (EGD) WITH PROPOFOL N/A 10/23/2019    Procedure: ESOPHAGOGASTRODUODENOSCOPY (EGD) WITH PROPOFOL;  Surgeon: Lavena Bullion, DO;  Location: [place];  Service: Gastroenterology;  Laterality: N/A;   FLEXIBLE SIGMOIDOSCOPY   12/12/2008    diverticulosis   HAND SURGERY        x5 right   HYDRADENITIS EXCISION Right 03/21/2014    Procedure: EXCISION HIDRADENITIS AXILLA;  Surgeon: Pedro Earls, MD;  Location: [place];  Service: General;  Laterality: Right;   KNEE ARTHROSCOPY        x2 left   NEPHRECTOMY   2013    left   TRIGGER FINGER RELEASE Left 06/23/2015    Procedure: RELEASE TRIGGER FINGER/A-1 PULLEY LEFT RING FINGER;  Surgeon: Daryll Brod, MD;  Location: [place];  Service: Orthopedics;  Laterality: Left;   TUBAL LIGATION   1980    UPPER GASTROINTESTINAL ENDOSCOPY   12/12/2008    gastroporesis   XI ROBOTIC ASSISTED VENTRAL HERNIA N/A 01/21/2022    Procedure: ROBOTIC INCISIONAL HERNIA REPAIR WITH MESH, BILATERAL POSTERIOR RECTUS MYOFASCIAL RELEASE;  Surgeon: Felicie Morn, MD;  Location: [facility];  Service: General;  Laterality: N/A;         Family History  Problem Relation Age of Onset   Colon polyps Mother     Diabetes Maternal Grandmother     Breast cancer Maternal Grandmother     Diabetes Father     Breast cancer Daughter     Colon cancer Neg Hx      Social History:  reports that she has never smoked. She has never used smokeless tobacco. She reports that she does not drink alcohol and does not use drugs. Allergies: No Known Allergies       Medications Prior to Admission  Medication Sig Dispense Refill   albuterol (VENTOLIN HFA) 108 (90 Base) MCG/ACT inhaler Inhale 2 puffs into the lungs every 6 (six) hours as needed for wheezing.       Exenatide ER 2 MG PEN Inject 2 mg into the skin every Saturday.       fluticasone (FLONASE) 50 MCG/ACT nasal spray Place 1 spray into both nostrils daily as needed for allergies.       lisinopril-hydrochlorothiazide (ZESTORETIC) 20-12.5 MG tablet Take 2 tablets by mouth daily.       carvedilol (COREG) 6.25 MG tablet Take 6.25 mg by mouth 2 (two) times daily with a meal. (Patient not taking: Reported on 01/27/2023)          Home: Home Living Family/patient expects to be discharged to:: Private residence Living Arrangements: Other relatives (grandson) Available Help at Discharge: Family, Available PRN/intermittently Type of Home: Apartment Home Access: Level entry Home Layout: One level Bathroom Shower/Tub: Chiropodist: Standard Home Equipment: Conservation officer, nature (2 wheels), BSC/3in1, Financial controller: Reacher  Functional History: Prior Function Prior Level of Function : Independent/Modified Independent, Driving Mobility  Comments: independent ADLs Comments: stands in shower now (used to use BSC in tub to sit); does housework, goes shopping Functional Status:  Mobility: Bed Mobility Overal bed mobility: Modified Independent General bed mobility comments: including managing linens Transfers Overall transfer level: Needs assistance Equipment used: 2 person hand held assist Transfers: Sit to/from Stand Sit to Stand: Min assist, +2 physical assistance Bed to/from chair/wheelchair/BSC transfer type:: Step pivot Step pivot transfers: Min assist, +2 physical assistance General transfer comment: posterior imbalance when coming to stand with assist to recover Ambulation/Gait Ambulation/Gait assistance: +2 physical assistance, Mod assist Gait Distance (Feet): 100 Feet Assistive device: 2 person hand held assist, 1 person hand held assist Gait Pattern/deviations: Step-to pattern, Decreased stride length, Shuffle, Step-through pattern,  Leaning posteriorly, Wide base of support General Gait Details: Pt with incr lateral excursion and incr support via bil UE progressing to just LUE; initially did not want to use RW however by end of walk able to acknowledge that she should use. RN also educated. Gait velocity interpretation: 1.31 - 2.62 ft/sec, indicative of limited community ambulator   ADL: ADL Overall ADL's : Needs assistance/impaired Eating/Feeding: Set up, Sitting Eating/Feeding Details (indicate cue type and reason): using L hand as R hand was incoordinated Grooming: Set up, Sitting Upper Body Bathing: Minimal assistance, Sitting Lower Body Bathing: Minimal assistance, Sitting/lateral leans, Sit to/from stand, Cueing for safety Upper Body Dressing : Minimal assistance, Sitting Lower Body Dressing: Minimal assistance, Sitting/lateral leans, Sit to/from stand Toilet Transfer: Min guard, Cueing for safety Toileting- Clothing Manipulation and Hygiene: Min guard, Sitting/lateral lean, Bed level Functional  mobility during ADLs: Min guard, Cueing for safety, Cueing for sequencing   Cognition: Cognition Overall Cognitive Status: Within Functional Limits for tasks assessed Orientation Level: Oriented X4 Cognition Arousal/Alertness: Awake/alert Behavior During Therapy: WFL for tasks assessed/performed Overall Cognitive Status: Within Functional Limits for tasks assessed General Comments: Passed Short Blessed Test for recall and problem solving.   Blood pressure (!) 146/74, pulse 82, temperature 98.6 F (37 C), temperature source Oral, resp. rate 16, height 5\' 2"  (1.575 m), weight 103 kg, last menstrual period 11/16/2002, SpO2 98 %. Physical Exam Constitutional:      Appearance: She is obese.  HENT:     Head: Normocephalic and atraumatic.     Right Ear: External ear normal.     Left Ear: External ear normal.     Nose: Nose normal.     Mouth/Throat:     Mouth: Mucous membranes are moist.  Eyes:     Extraocular Movements: Extraocular movements intact.     Pupils: Pupils are equal, round, and reactive to light.  Cardiovascular:     Rate and Rhythm: Normal rate and regular rhythm.  Pulmonary:     Effort: Pulmonary effort is normal. No respiratory distress.  Abdominal:     Palpations: Abdomen is soft.  Musculoskeletal:        General: No swelling. Normal range of motion.     Cervical back: Normal range of motion.  Skin:    General: Skin is warm and dry.  Neurological:     Mental Status: She is alert.     Comments: Alert and oriented x 3. Normal insight and awareness. Intact Memory. Normal language and speech. Cranial nerve exam unremarkable today. Decreased LT slightly in LUE. RUE>RLE sensory loss present. Decreased [place] RUE and RLE present. No focal ataxia. No resting tone and DTR's 1+  Psychiatric:        Mood and Affect: Mood normal.        Behavior: Behavior normal.        Lab Results Last 24 Hours       Results for orders placed or performed during the hospital encounter of  01/27/23 (from the past 24 hour(s))  Glucose, capillary     Status: None    Collection Time: 01/29/23 11:54 AM  Result Value Ref Range    Glucose-Capillary 88 70 - 99 mg/dL    Comment 1 Glucose Stabilizer    Glucose, capillary     Status: Abnormal    Collection Time: 01/29/23  4:34 PM  Result Value Ref Range    Glucose-Capillary 106 (H) 70 - 99 mg/dL  Glucose, capillary     Status: Abnormal  Collection Time: 01/29/23  7:57 PM  Result Value Ref Range    Glucose-Capillary 184 (H) 70 - 99 mg/dL  Glucose, capillary     Status: Abnormal    Collection Time: 01/29/23 11:52 PM  Result Value Ref Range    Glucose-Capillary 123 (H) 70 - 99 mg/dL  Glucose, capillary     Status: Abnormal    Collection Time: 01/30/23  3:47 AM  Result Value Ref Range    Glucose-Capillary 110 (H) 70 - 99 mg/dL  Basic metabolic panel     Status: Abnormal    Collection Time: 01/30/23  6:58 AM  Result Value Ref Range    Sodium 135 135 - 145 mmol/L    Potassium 4.0 3.5 - 5.1 mmol/L    Chloride 104 98 - 111 mmol/L    CO2 24 22 - 32 mmol/L    Glucose, Bld 105 (H) 70 - 99 mg/dL    BUN 32 (H) 8 - 23 mg/dL    Creatinine, Ser 1.78 (H) 0.44 - 1.00 mg/dL    Calcium 8.4 (L) 8.9 - 10.3 mg/dL    GFR, Estimated 31 (L) >60 mL/min    Anion gap 7 5 - 15  Glucose, capillary     Status: None    Collection Time: 01/30/23  7:51 AM  Result Value Ref Range    Glucose-Capillary 83 70 - 99 mg/dL       Imaging Results (Last 48 hours)  VAS US CAROTID   Result Date: 01/29/2023 Carotid Arterial Duplex Study Patient Name:  [name]  Date of Exam:   01/28/2023 Medical Rec #: [id number]           Accession #:    [id number] Date of Birth: [birth year]          Patient Gender: F Patient Age:   57 years Exam Location:  [facility] Procedure:      VAS US CAROTID Referring Phys: Elwin Sleight DE LA TORRE --------------------------------------------------------------------------------  Indications:       CVA and ICH. Risk Factors:       Hypertension, hyperlipidemia, Diabetes. Limitations        Today's exam was limited due to the body habitus of the                    patient and somnolent. Comparison Study:  Prior normal carotid duplex done 01/06/18 Performing Technologist: Sharion Dove RVS  Examination Guidelines: A complete evaluation includes B-mode imaging, spectral Doppler, color Doppler, and power Doppler as needed of all accessible portions of each vessel. Bilateral testing is considered an integral part of a complete examination. Limited examinations for reoccurring indications may be performed as noted.  Right Carotid Findings: +----------+--------+--------+--------+------------------+------------------+           PSV cm/sEDV cm/sStenosisPlaque DescriptionComments           +----------+--------+--------+--------+------------------+------------------+ CCA Prox  79      8                                 intimal thickening +----------+--------+--------+--------+------------------+------------------+ CCA Distal85      13                                intimal thickening +----------+--------+--------+--------+------------------+------------------+ ICA Prox  58      10                                                   +----------+--------+--------+--------+------------------+------------------+  ICA Mid   69      14                                                   +----------+--------+--------+--------+------------------+------------------+ ICA Distal49      12                                                   +----------+--------+--------+--------+------------------+------------------+ ECA       100     12                                                   +----------+--------+--------+--------+------------------+------------------+ +----------+--------+-------+--------+-------------------+           PSV cm/sEDV cmsDescribeArm Pressure (mmHG)  +----------+--------+-------+--------+-------------------+ [id number]                                         +----------+--------+-------+--------+-------------------+ +---------+--------+--+--------+--+ VertebralPSV cm/s50EDV cm/s14 +---------+--------+--+--------+--+  Left Carotid Findings: +----------+--------+--------+--------+------------------+------------------+           PSV cm/sEDV cm/sStenosisPlaque DescriptionComments           +----------+--------+--------+--------+------------------+------------------+ CCA Prox  78      9                                 intimal thickening +----------+--------+--------+--------+------------------+------------------+ CCA Distal70      10                                intimal thickening +----------+--------+--------+--------+------------------+------------------+ ICA Prox  51      11                                                   +----------+--------+--------+--------+------------------+------------------+ ICA Mid   55      12                                                   +----------+--------+--------+--------+------------------+------------------+ ICA Distal44      14                                                   +----------+--------+--------+--------+------------------+------------------+ ECA       66      1                                                    +----------+--------+--------+--------+------------------+------------------+ +----------+--------+--------+--------+-------------------+  PSV cm/sEDV cm/sDescribeArm Pressure (mmHG) +----------+--------+--------+--------+-------------------+ [id number]                                         +----------+--------+--------+--------+-------------------+ +---------+--------+--+--------+--+ VertebralPSV cm/s58EDV cm/s10 +---------+--------+--+--------+--+   Summary: Right Carotid: The extracranial vessels were  near-normal with only minimal wall                thickening or plaque. Left Carotid: The extracranial vessels were near-normal with only minimal wall               thickening or plaque. Vertebrals:  Bilateral vertebral arteries demonstrate antegrade flow. Subclavians: Normal flow hemodynamics were seen in bilateral subclavian              arteries. *See table(s) above for measurements and observations.  Electronically signed by Antony Contras MD on 01/29/2023 at 9:08:55 AM.    Final     EEG adult   Result Date: 01/28/2023 Lora Havens, MD     01/28/2023  6:05 PM Patient Name: [name] MRN: [id number] Epilepsy Attending: Lora Havens Referring Physician/Provider: Rosalin Hawking, MD Date: 01/28/2023 Duration: 23.33 mins Patient history: 67yo F with ams. EEG to evaluate for seizure Level of alertness: Awake, drowsy AEDs during EEG study: Ativan Technical aspects: This EEG study was done with scalp electrodes positioned according to the 10-20 International system of electrode placement. Electrical activity was reviewed with band pass filter of 1-70Hz , sensitivity of 7 uV/mm, display speed of 34mm/sec with a 60Hz  notched filter applied as appropriate. EEG data were recorded continuously and digitally stored.  Video monitoring was available and reviewed as appropriate. Description: The posterior dominant rhythm consists of 9-10 Hz activity of moderate voltage (25-35 uV) seen predominantly in posterior head regions, symmetric and reactive to eye opening and eye closing. Drowsiness was characterized by attenuation of the posterior background rhythm. Hyperventilation and photic stimulation were not performed.   IMPRESSION: This study is within normal limits. No seizures or epileptiform discharges were seen throughout the recording. A normal interictal EEG does not exclude  the diagnosis of epilepsy. Lora Havens    MR ANGIO HEAD WO CONTRAST   Result Date: 01/28/2023 CLINICAL DATA:  Provided history:  Stroke, hemorrhagic. EXAM: MRA HEAD WITHOUT CONTRAST TECHNIQUE: Angiographic images of the Circle of Willis were acquired using MRA technique without intravenous contrast. COMPARISON:  MRA head 05/19/2021. FINDINGS: Anterior circulation: The intracranial internal carotid arteries are patent. The M1 middle cerebral arteries are patent. Severe stenosis within a mid to distal right M2 MCA vessel (at site of prior occlusion )(series 355, image 18). Progressive severe long segment stenosis within a proximal M2 left MCA vessel (series 355, image 6) (series 356, image 135). As before, the right anterior cerebral artery is diminutive beyond the proximal right A2 segment. Mild atherosclerotic narrowing of the right A1 segment, unchanged. 2 mm inferiorly projecting vascular protrusion arising from the cavernous left internal carotid artery (series 355, image 15). This was not definitively present on the prior MRA head of 05/19/2021. Posterior circulation: The intracranial vertebral arteries are patent. The basilar artery is patent. The posterior cerebral arteries are patent. Progressive moderate/severe stenosis within the left PCA P2 segment. Posterior communicating arteries are present bilaterally. Anatomic variants: As described. Impressions #3 will be called to the ordering clinician or representative by the Radiologist Assistant, and communication documented in the PACS or [employer]. IMPRESSION: 1. No  intracranial large vessel occlusion is identified. 2. Intracranial atherosclerotic disease with multifocal stenoses, most notably as follows. 3. Progressive severe long segment stenosis within a proximal M2 left middle cerebral artery vessel. 4. Severe stenosis within a mid-to-distal right M2 middle cerebral artery vessel (at site of prior occlusion). 5. Progressive moderate/severe stenosis within the left posterior cerebral artery P2 segment. 6. 2 mm inferiorly projecting vascular protrusion arising from the  cavernous left internal carotid artery. This may reflect an aneurysm or the origin of an otherwise poorly delineated branch vessel. Electronically Signed   By: Kellie Simmering D.O.   On: 01/28/2023 17:59    CT HEAD WO CONTRAST (5MM)   Result Date: 01/28/2023 CLINICAL DATA:  Provided history: Stroke, hemorrhagic. EXAM: CT HEAD WITHOUT CONTRAST TECHNIQUE: Contiguous axial images were obtained from the base of the skull through the vertex without intravenous contrast. RADIATION DOSE REDUCTION: This exam was performed according to the departmental dose-optimization program which includes automated exposure control, adjustment of the mA and/or kV according to patient size and/or use of iterative reconstruction technique. COMPARISON:  Brain MRI performed earlier today 01/28/2023. Prior head CT examinations 01/27/2023 and earlier. FINDINGS: Brain: No age advanced or lobar predominant parenchymal atrophy. The acute parenchymal hemorrhage within the left thalamus measures 1.1 x 0.8 x 0.9 cm on the current exam (< 1 mL). This is slightly decreased in size from the prior head CT of 01/27/2023 (previously measuring 1.2 x 0.9 x 0.8 cm). Mild surrounding edema is unchanged. Multiple small subacute infarcts within the left frontoparietal white matter were better appreciated on the brain MRI performed earlier today. Chronic lacunar infarcts within the right corona radiata and bilateral deep gray nuclei. Background patchy and ill-defined hypoattenuation within the cerebral white matter, nonspecific but compatible chronic small vessel ischemic disease (moderate to severe by MRI performed earlier today). Redemonstrated chronic infarct within the right basal ganglia/insula. Associated chronic blood products at this site on MRI brain performed earlier today. Partially empty sella turcica. No extra-axial fluid collection. No evidence of an intracranial mass. No midline shift. Vascular: No hyperdense vessel. Atherosclerotic  calcifications. Skull: No fracture or aggressive osseous lesion. Sinuses/Orbits: No mass or acute finding within the imaged orbits. No significant paranasal sinus disease at the imaged levels. IMPRESSION: 1. Known small (< 1 mL) acute parenchymal hemorrhage within the left thalamus, slightly decreased in size from the head CT of 01/27/2023. Mild surrounding edema is unchanged. 2. Multiple small subacute infarcts within the left frontoparietal white matter were better appreciated on the brain MRI performed earlier today. 3. Background chronic small vessel disease and chronic lacunar infarcts as described. Electronically Signed   By: Kellie Simmering D.O.   On: 01/28/2023 17:45    MR BRAIN WO CONTRAST   Result Date: 01/28/2023 CLINICAL DATA:  Provided history: Stroke, follow-up. EXAM: MRI HEAD WITHOUT CONTRAST TECHNIQUE: Multiplanar, multiecho pulse sequences of the brain and surrounding structures were obtained without intravenous contrast. COMPARISON:  Head CT 01/27/2023.  Brain MRI 05/19/2021. FINDINGS: Intermittently motion degraded exam. Brain: No age advanced or lobar predominant parenchymal atrophy. Small acute hemorrhage within the left thalamus, grossly unchanged from the head CT of 01/27/2023. Multiple small subacute infarcts within the left frontoparietal white matter (MCA vascular territory) measuring up to 9 mm. Chronic lacunar infarcts within the right corona radiata and bilateral deep gray nuclei. Chronic hemorrhages within the bilateral deep gray nuclei and within the brainstem. A chronic hemorrhage within the left thalamus is new from the prior brain MRI of 05/19/2021. Multifocal  T2 FLAIR hyperintense signal abnormality within the cerebral white matter, nonspecific but compatible with moderate to advanced chronic small vessel ischemic disease. Moderate chronic small vessel ischemic changes within the pons, including a tiny chronic lacunar infarct centrally. Tiny chronic infarct within the right  cerebellar hemisphere. Partially empty sella turcica. No evidence of an intracranial mass. No extra-axial fluid collection. No midline shift. Vascular: Maintained flow voids within the proximal large arterial vessels. Skull and upper cervical spine: No focal suspicious marrow lesion. Incompletely assessed cervical spondylosis. Sinuses/Orbits: No mass or acute finding within the imaged orbits. No significant paranasal sinus disease. Impressions #1 and #2 will be called to the ordering clinician or representative by the Radiologist Assistant, and communication documented in the PACS or [employer]. IMPRESSION: 1. Small acute parenchymal hemorrhage within the left thalamus, unchanged in size from the head CT performed 01/27/2023. 2. Small subacute infarcts within the left frontoparietal white matter, measuring up to 9 mm. 3. Background chronic small vessel ischemic disease, chronic infarcts and chronic hemorrhages as described Electronically Signed   By: Kellie Simmering D.O.   On: 01/28/2023 15:04    ECHOCARDIOGRAM COMPLETE   Result Date: 01/28/2023    ECHOCARDIOGRAM REPORT   Patient Name:   [name] Date of Exam: 01/28/2023 Medical Rec #:  [id number]          Height:       62.0 in Accession #:    [id number]         Weight:       227.0 lb Date of Birth:  23-Jul-1956         BSA:          2.017 m Patient Age:    16 years           BP:           142/77 mmHg Patient Gender: F                  HR:           84 bpm. Exam Location:  Inpatient Procedure: 2D Echo, Cardiac Doppler and Color Doppler Indications:    Stroke I63.9  History:        Patient has prior history of Echocardiogram examinations, most                 recent 05/19/2021. Stroke, Signs/Symptoms:Chest Pain; Risk                 Factors:Hypertension, Dyslipidemia and Diabetes. CKD, stage 2.  Sonographer:    Ronny Flurry Referring Phys: Merlín.Osler MCNEILL P [place]  1. Left ventricular ejection fraction, by estimation, is 60 to 65%. The  left ventricle has normal function. The left ventricle has no regional wall motion abnormalities. There is mild left ventricular hypertrophy of the septal segment. Left ventricular diastolic parameters are consistent with Grade I diastolic dysfunction (impaired relaxation).  2. Right ventricular systolic function is normal. The right ventricular size is normal.  3. The mitral valve is normal in structure. No evidence of mitral valve regurgitation. No evidence of mitral stenosis.  4. The aortic valve is tricuspid. There is mild calcification of the aortic valve. Aortic valve regurgitation is not visualized. Aortic valve sclerosis is present, with no evidence of aortic valve stenosis. Aortic valve mean gradient measures 3.0 mmHg. Aortic valve Vmax measures 1.22 m/s.  5. The inferior vena cava is normal in size with greater than 50% respiratory variability, suggesting right atrial pressure of 3 mmHg.  Conclusion(s)/Recommendation(s): No intracardiac source of embolism detected on this transthoracic study. Consider a transesophageal echocardiogram to exclude cardiac source of embolism if clinically indicated. FINDINGS  Left Ventricle: Left ventricular ejection fraction, by estimation, is 60 to 65%. The left ventricle has normal function. The left ventricle has no regional wall motion abnormalities. The left ventricular internal cavity size was normal in size. There is  mild left ventricular hypertrophy of the septal segment. Left ventricular diastolic parameters are consistent with Grade I diastolic dysfunction (impaired relaxation). Right Ventricle: The right ventricular size is normal. No increase in right ventricular wall thickness. Right ventricular systolic function is normal. Left Atrium: Left atrial size was normal in size. Right Atrium: Right atrial size was normal in size. Pericardium: There is no evidence of pericardial effusion. Presence of epicardial fat layer. Mitral Valve: The mitral valve is normal in  structure. No evidence of mitral valve regurgitation. No evidence of mitral valve stenosis. Tricuspid Valve: The tricuspid valve is normal in structure. Tricuspid valve regurgitation is not demonstrated. No evidence of tricuspid stenosis. Aortic Valve: The aortic valve is tricuspid. There is mild calcification of the aortic valve. Aortic valve regurgitation is not visualized. Aortic valve sclerosis is present, with no evidence of aortic valve stenosis. Aortic valve mean gradient measures 3.0 mmHg. Aortic valve peak gradient measures 6.0 mmHg. Aortic valve area, by VTI measures 2.85 cm. Pulmonic Valve: The pulmonic valve was normal in structure. Pulmonic valve regurgitation is not visualized. No evidence of pulmonic stenosis. Aorta: The aortic root is normal in size and structure. Venous: The inferior vena cava is normal in size with greater than 50% respiratory variability, suggesting right atrial pressure of 3 mmHg. IAS/Shunts: No atrial level shunt detected by color flow Doppler.  LEFT VENTRICLE PLAX 2D LVIDd:         5.00 cm   Diastology LVIDs:         3.10 cm   LV e' medial:    7.23 cm/s LV PW:         1.10 cm   LV E/e' medial:  9.9 LV IVS:        1.20 cm   LV e' lateral:   6.09 cm/s LVOT diam:     2.10 cm   LV E/e' lateral: 11.8 LV SV:         64 LV SV Index:   32 LVOT Area:     3.46 cm  RIGHT VENTRICLE             IVC RV S prime:     12.80 cm/s  IVC diam: 1.60 cm LEFT ATRIUM             Index LA diam:        3.10 cm 1.54 cm/m LA Vol (A2C):   40.9 ml 20.27 ml/m LA Vol (A4C):   52.4 ml 25.97 ml/m LA Biplane Vol: 49.0 ml 24.29 ml/m  AORTIC VALVE AV Area (Vmax):    2.87 cm AV Area (Vmean):   2.76 cm AV Area (VTI):     2.85 cm AV Vmax:           122.00 cm/s AV Vmean:          82.100 cm/s AV VTI:            0.225 m AV Peak Grad:      6.0 mmHg AV Mean Grad:      3.0 mmHg LVOT Vmax:         101.23 cm/s  LVOT Vmean:        65.400 cm/s LVOT VTI:          0.185 m LVOT/AV VTI ratio: 0.82  AORTA Ao Root diam: 3.00  cm Ao Asc diam:  2.60 cm MITRAL VALVE MV Area (PHT): 3.65 cm    SHUNTS MV Decel Time: 208 msec    Systemic VTI:  0.19 m MV E velocity: 71.60 cm/s  Systemic Diam: 2.10 cm MV A velocity: 97.30 cm/s MV E/A ratio:  0.74 Candee Furbish MD Electronically signed by Candee Furbish MD Signature Date/Time: 01/28/2023/12:44:14 PM    Final        Assessment/Plan: Diagnosis: 67 year old female with 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   Does the need for close, 24 hr/day medical supervision in concert with the patient's rehab needs make it unreasonable for this patient to be served in a less intensive setting? Yes Co-Morbidities requiring supervision/potential complications:  -HTN -Acute on chronic renal failure -prior cerebral infarct and hemorrhage -obesity Due to bladder management, bowel management, safety, skin/wound care, disease management, medication administration, and patient education, does the patient require 24 hr/day rehab nursing? Yes Does the patient require coordinated care of a physician, rehab nurse, therapy disciplines of PT, OT to address physical and functional deficits in the context of the above medical diagnosis(es)? Yes Addressing deficits in the following areas: balance, endurance, locomotion, strength, transferring, bowel/bladder control, bathing, dressing, feeding, grooming, toileting, and psychosocial support Can the patient actively participate in an intensive therapy program of at least 3 hrs of therapy per day at least 5 days per week? Yes The potential for patient to make measurable gains while on inpatient rehab is excellent Anticipated functional outcomes upon discharge from inpatient rehab are modified independent and supervision  with PT, modified independent and supervision with OT, n/a with SLP. Estimated rehab length of stay to reach the above functional goals is: 7-11  days Anticipated discharge destination: Home Overall Rehab/Functional Prognosis: excellent   POST ACUTE RECOMMENDATIONS: This patient's condition is appropriate for continued rehabilitative care in the following setting: CIR Patient has agreed to participate in recommended program. Yes Note that insurance prior authorization may be required for reimbursement for recommended care.   Comment: Rehab Admissions Coordinator to follow up. I believe she can get to mod I for basic needs. Has grandson at home who can help in mornings and at night.            I have personally performed a face to face diagnostic evaluation of this patient. Additionally, I have examined the patient's medical record including any pertinent labs and radiographic images. If the physician assistant has documented in this note, I have reviewed and edited or otherwise concur with the physician assistant's documentation.   Thanks,   Monica Staggers, MD 01/30/2023          Routing History

## 2023-01-31 NOTE — Progress Notes (Signed)
Inpatient Rehabilitation Admissions Coordinator   I have insurance approval and CIR bed to admit her to today. I met at bedside and she is in agreement. I have alerted acute team and TOC. I will make the arrangements to admit today.  Danne Baxter, RN, MSN Rehab Admissions Coordinator [phone] 01/31/2023 11:17 AM

## 2023-01-31 NOTE — Discharge Summary (Addendum)
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   albuterol 108 (90 Base) MCG/ACT inhaler Commonly known as: VENTOLIN HFA Inhale 2 puffs into the lungs every 6 (six) hours as needed for wheezing.   amLODipine 10 MG tablet Commonly known as: NORVASC Take 1 tablet (10 mg total) by mouth daily. Start taking on: February 01, 2023   carvedilol 6.25 MG tablet Commonly known as: COREG Take 6.25 mg by mouth 2 (two) times daily with a meal.   ceFAZolin 1-4 GM/50ML-% Soln Commonly known as: ANCEF Inject 50 mLs (1 g total) into the vein every 8 (eight) hours.   colchicine 0.6 MG tablet Take 1 tablet (0.6 mg total) by mouth daily. Start taking on: February 01, 2023   Exenatide ER 2 MG Pen Inject 2 mg into the skin every Saturday.   fluticasone 50 MCG/ACT nasal spray Commonly known as: FLONASE Place 1 spray into both nostrils daily as needed for allergies.   pantoprazole 40 MG tablet Commonly known as: PROTONIX Take 1 tablet (40 mg total) by mouth daily. Start taking on: February 01, 2023   predniSONE 20 MG tablet Commonly known as: DELTASONE Take 2 tablets (40 mg total) by mouth daily with breakfast. Start taking on: February 01, 2023   rosuvastatin 20 MG tablet Commonly known as: CRESTOR Take 1 tablet (20 mg total) by mouth daily. Start taking on: February 01, 2023   senna-docusate 8.6-50 MG tablet Commonly known as: Senokot-S Take 1 tablet by mouth 2 (two) times daily.        LABORATORY STUDIES CBC    Component Value Date/Time   WBC 6.7 01/29/2023 0323   RBC 4.31 01/29/2023 0323   HGB 12.0 01/29/2023 0323   HCT 39.0 01/29/2023 0323   PLT 226 01/29/2023 0323   MCV 90.5 01/29/2023 0323   MCH 27.8 01/29/2023 0323   MCHC 30.8 01/29/2023 0323   RDW 14.2 01/29/2023 0323   LYMPHSABS 2.9 01/27/2023 1647   MONOABS 0.6 01/27/2023 1647   EOSABS 0.2 01/27/2023 1647   BASOSABS 0.0 01/27/2023 1647   CMP    Component Value Date/Time   NA 135 01/31/2023 0241   K 4.2 01/31/2023 0241   CL 103 01/31/2023 0241   CO2 20 (L) 01/31/2023 0241   GLUCOSE 195 (H) 01/31/2023 0241   BUN 38 (H) 01/31/2023 0241   CREATININE 1.98 (H) 01/31/2023 0241   CALCIUM 8.3 (L) 01/31/2023 0241   PROT 6.5 01/27/2023 1647   ALBUMIN 2.5 (L) 01/27/2023 1647   AST  22 01/27/2023 1647   ALT 17 01/27/2023 1647   ALKPHOS 107 01/27/2023 1647   BILITOT 0.2 (L) 01/27/2023 1647   GFRNONAA 28 (L) 01/31/2023 0241   GFRAA 51 (L) 01/09/2020 1149   COAGS Lab Results  Component Value Date   INR 1.0 05/18/2021   INR 0.9 01/09/2020   INR 0.97 01/04/2018   Lipid Panel    Component Value Date/Time   CHOL 242 (H) 01/29/2023 0323   TRIG 115 01/29/2023 0323   HDL 48 01/29/2023 0323   CHOLHDL 5.0 01/29/2023 0323   VLDL 23 01/29/2023 0323   LDLCALC 171 (H) 01/29/2023 0323   HgbA1C  Lab Results  Component Value Date   HGBA1C 7.1 (H) 01/28/2023   Urinalysis    Component Value Date/Time   COLORURINE YELLOW 10/20/2019 0940   APPEARANCEUR CLOUDY (A) 10/20/2019 0940   LABSPEC 1.025  10/20/2019 0940   PHURINE 5.5 10/20/2019 0940   GLUCOSEU 100 (A) 10/20/2019 0940   GLUCOSEU NEGATIVE 07/28/2009 1005   HGBUR TRACE (A) 10/20/2019 0940   BILIRUBINUR SMALL (A) 10/20/2019 0940   BILIRUBINUR  08/20/2013 1147     Comment:     Limited sample, not performed   KETONESUR NEGATIVE 10/20/2019 0940   PROTEINUR >300 (A) 10/20/2019 0940   UROBILINOGEN 0.2 03/01/2014 1303   NITRITE NEGATIVE 10/20/2019 0940   LEUKOCYTESUR NEGATIVE 10/20/2019 0940   Urine Drug Screen     Component Value Date/Time   LABOPIA NONE DETECTED 01/28/2023 1841   COCAINSCRNUR NONE DETECTED 01/28/2023 1841   LABBENZ NONE DETECTED 01/28/2023 1841   AMPHETMU NONE DETECTED 01/28/2023 1841   THCU NONE DETECTED 01/28/2023 1841   LABBARB NONE DETECTED 01/28/2023 1841    Alcohol Level    Component Value Date/Time   ETH <10 01/09/2020 1149     SIGNIFICANT DIAGNOSTIC STUDIES DG Ankle Complete Left  Result Date: 01/30/2023 CLINICAL DATA:  Left anterior ankle pain for 1 day, initial encounter EXAM: LEFT ANKLE COMPLETE - 3+ VIEW COMPARISON:  None Available. FINDINGS: No acute fracture or dislocation is noted. Mild tarsal degenerative changes are seen. Mild soft tissue swelling is noted. IMPRESSION: Mild soft tissue swelling without acute bony abnormality. Electronically Signed   By: Inez Catalina M.D.   On: 01/30/2023 10:46   VAS US CAROTID  Result Date: 01/29/2023 Carotid Arterial Duplex Study Patient Name:  [name]  Date of Exam:   01/28/2023 Medical Rec #: [id number]           Accession #:    [id number] Date of Birth: 08/10/1956          Patient Gender: F Patient Age:   67 years Exam Location:  [facility] Procedure:      VAS US CAROTID Referring Phys: Elwin Sleight DE LA TORRE --------------------------------------------------------------------------------  Indications:       CVA and ICH. Risk Factors:      Hypertension, hyperlipidemia, Diabetes. Limitations        Today's exam was limited due to the  body habitus of the                    patient and somnolent. Comparison Study:  Prior normal carotid duplex done 01/06/18 Performing Technologist: Sharion Dove RVS  Examination Guidelines: A complete evaluation includes B-mode imaging, spectral Doppler, color Doppler, and power Doppler as needed of all accessible portions of each vessel. Bilateral testing is considered an integral part of a complete examination. Limited examinations for reoccurring indications may be performed as noted.  Right Carotid Findings: +----------+--------+--------+--------+------------------+------------------+           PSV cm/sEDV cm/sStenosisPlaque DescriptionComments           +----------+--------+--------+--------+------------------+------------------+ CCA Prox  79      8                                 intimal thickening +----------+--------+--------+--------+------------------+------------------+ CCA Distal85      13                                intimal thickening +----------+--------+--------+--------+------------------+------------------+ ICA Prox  58      10                                                   +----------+--------+--------+--------+------------------+------------------+ ICA Mid   69      14                                                   +----------+--------+--------+--------+------------------+------------------+ ICA Distal49      12                                                   +----------+--------+--------+--------+------------------+------------------+ ECA       100     12                                                   +----------+--------+--------+--------+------------------+------------------+ +----------+--------+-------+--------+-------------------+           PSV cm/sEDV cmsDescribeArm Pressure (mmHG) +----------+--------+-------+--------+-------------------+ [id number]                                          +----------+--------+-------+--------+-------------------+ +---------+--------+--+--------+--+ VertebralPSV cm/s50EDV cm/s14 +---------+--------+--+--------+--+  Left Carotid Findings: +----------+--------+--------+--------+------------------+------------------+           PSV cm/sEDV cm/sStenosisPlaque DescriptionComments           +----------+--------+--------+--------+------------------+------------------+ CCA Prox  78      9                                 intimal thickening +----------+--------+--------+--------+------------------+------------------+ CCA Distal70      10                                intimal thickening +----------+--------+--------+--------+------------------+------------------+ ICA Prox  51      11                                                   +----------+--------+--------+--------+------------------+------------------+  ICA Mid   55      12                                                   +----------+--------+--------+--------+------------------+------------------+ ICA Distal44      14                                                   +----------+--------+--------+--------+------------------+------------------+ ECA       66      1                                                    +----------+--------+--------+--------+------------------+------------------+ +----------+--------+--------+--------+-------------------+           PSV cm/sEDV cm/sDescribeArm Pressure (mmHG) +----------+--------+--------+--------+-------------------+ [id number]                                         +----------+--------+--------+--------+-------------------+ +---------+--------+--+--------+--+ VertebralPSV cm/s58EDV cm/s10 +---------+--------+--+--------+--+   Summary: Right Carotid: The extracranial vessels were near-normal with only minimal wall                thickening or plaque. Left Carotid: The extracranial vessels were  near-normal with only minimal wall               thickening or plaque. Vertebrals:  Bilateral vertebral arteries demonstrate antegrade flow. Subclavians: Normal flow hemodynamics were seen in bilateral subclavian              arteries. *See table(s) above for measurements and observations.  Electronically signed by Antony Contras MD on 01/29/2023 at 9:08:55 AM.    Final    EEG adult  Result Date: 01/28/2023 Lora Havens, MD     01/28/2023  6:05 PM Patient Name: [name] MRN: [id number] Epilepsy Attending: Lora Havens Referring Physician/Provider: Rosalin Hawking, MD Date: 01/28/2023 Duration: 23.33 mins Patient history: 67yo F with ams. EEG to evaluate for seizure Level of alertness: Awake, drowsy AEDs during EEG study: Ativan Technical aspects: This EEG study was done with scalp electrodes positioned according to the 10-20 International system of electrode placement. Electrical activity was reviewed with band pass filter of 1-70Hz , sensitivity of 7 uV/mm, display speed of 54mm/sec with a 60Hz  notched filter applied as appropriate. EEG data were recorded continuously and digitally stored.  Video monitoring was available and reviewed as appropriate. Description: The posterior dominant rhythm consists of 9-10 Hz activity of moderate voltage (25-35 uV) seen predominantly in posterior head regions, symmetric and reactive to eye opening and eye closing. Drowsiness was characterized by attenuation of the posterior background rhythm. Hyperventilation and photic stimulation were not performed.   IMPRESSION: This study is within normal limits. No seizures or epileptiform discharges were seen throughout the recording. A normal interictal EEG does not exclude  the diagnosis of epilepsy. Lora Havens   MR ANGIO HEAD WO CONTRAST  Result Date: 01/28/2023 CLINICAL DATA:  Provided history: Stroke, hemorrhagic. EXAM: MRA HEAD WITHOUT CONTRAST TECHNIQUE: Angiographic images of  the Circle of Willis were acquired  using MRA technique without intravenous contrast. COMPARISON:  MRA head 05/19/2021. FINDINGS: Anterior circulation: The intracranial internal carotid arteries are patent. The M1 middle cerebral arteries are patent. Severe stenosis within a mid to distal right M2 MCA vessel (at site of prior occlusion )(series 355, image 18). Progressive severe long segment stenosis within a proximal M2 left MCA vessel (series 355, image 6) (series 356, image 135). As before, the right anterior cerebral artery is diminutive beyond the proximal right A2 segment. Mild atherosclerotic narrowing of the right A1 segment, unchanged. 2 mm inferiorly projecting vascular protrusion arising from the cavernous left internal carotid artery (series 355, image 15). This was not definitively present on the prior MRA head of 05/19/2021. Posterior circulation: The intracranial vertebral arteries are patent. The basilar artery is patent. The posterior cerebral arteries are patent. Progressive moderate/severe stenosis within the left PCA P2 segment. Posterior communicating arteries are present bilaterally. Anatomic variants: As described. Impressions #3 will be called to the ordering clinician or representative by the Radiologist Assistant, and communication documented in the PACS or [employer]. IMPRESSION: 1. No intracranial large vessel occlusion is identified. 2. Intracranial atherosclerotic disease with multifocal stenoses, most notably as follows. 3. Progressive severe long segment stenosis within a proximal M2 left middle cerebral artery vessel. 4. Severe stenosis within a mid-to-distal right M2 middle cerebral artery vessel (at site of prior occlusion). 5. Progressive moderate/severe stenosis within the left posterior cerebral artery P2 segment. 6. 2 mm inferiorly projecting vascular protrusion arising from the cavernous left internal carotid artery. This may reflect an aneurysm or the origin of an otherwise poorly delineated branch  vessel. Electronically Signed   By: Kellie Simmering D.O.   On: 01/28/2023 17:59   CT HEAD WO CONTRAST (5MM)  Result Date: 01/28/2023 CLINICAL DATA:  Provided history: Stroke, hemorrhagic. EXAM: CT HEAD WITHOUT CONTRAST TECHNIQUE: Contiguous axial images were obtained from the base of the skull through the vertex without intravenous contrast. RADIATION DOSE REDUCTION: This exam was performed according to the departmental dose-optimization program which includes automated exposure control, adjustment of the mA and/or kV according to patient size and/or use of iterative reconstruction technique. COMPARISON:  Brain MRI performed earlier today 01/28/2023. Prior head CT examinations 01/27/2023 and earlier. FINDINGS: Brain: No age advanced or lobar predominant parenchymal atrophy. The acute parenchymal hemorrhage within the left thalamus measures 1.1 x 0.8 x 0.9 cm on the current exam (< 1 mL). This is slightly decreased in size from the prior head CT of 01/27/2023 (previously measuring 1.2 x 0.9 x 0.8 cm). Mild surrounding edema is unchanged. Multiple small subacute infarcts within the left frontoparietal white matter were better appreciated on the brain MRI performed earlier today. Chronic lacunar infarcts within the right corona radiata and bilateral deep gray nuclei. Background patchy and ill-defined hypoattenuation within the cerebral white matter, nonspecific but compatible chronic small vessel ischemic disease (moderate to severe by MRI performed earlier today). Redemonstrated chronic infarct within the right basal ganglia/insula. Associated chronic blood products at this site on MRI brain performed earlier today. Partially empty sella turcica. No extra-axial fluid collection. No evidence of an intracranial mass. No midline shift. Vascular: No hyperdense vessel. Atherosclerotic calcifications. Skull: No fracture or aggressive osseous lesion. Sinuses/Orbits: No mass or acute finding within the imaged orbits. No  significant paranasal sinus disease at the imaged levels. IMPRESSION: 1. Known small (< 1 mL) acute parenchymal hemorrhage within the left thalamus, slightly decreased in size from the head CT  of 01/27/2023. Mild surrounding edema is unchanged. 2. Multiple small subacute infarcts within the left frontoparietal white matter were better appreciated on the brain MRI performed earlier today. 3. Background chronic small vessel disease and chronic lacunar infarcts as described. Electronically Signed   By: Kellie Simmering D.O.   On: 01/28/2023 17:45   MR BRAIN WO CONTRAST  Result Date: 01/28/2023 CLINICAL DATA:  Provided history: Stroke, follow-up. EXAM: MRI HEAD WITHOUT CONTRAST TECHNIQUE: Multiplanar, multiecho pulse sequences of the brain and surrounding structures were obtained without intravenous contrast. COMPARISON:  Head CT 01/27/2023.  Brain MRI 05/19/2021. FINDINGS: Intermittently motion degraded exam. Brain: No age advanced or lobar predominant parenchymal atrophy. Small acute hemorrhage within the left thalamus, grossly unchanged from the head CT of 01/27/2023. Multiple small subacute infarcts within the left frontoparietal white matter (MCA vascular territory) measuring up to 9 mm. Chronic lacunar infarcts within the right corona radiata and bilateral deep gray nuclei. Chronic hemorrhages within the bilateral deep gray nuclei and within the brainstem. A chronic hemorrhage within the left thalamus is new from the prior brain MRI of 05/19/2021. Multifocal T2 FLAIR hyperintense signal abnormality within the cerebral white matter, nonspecific but compatible with moderate to advanced chronic small vessel ischemic disease. Moderate chronic small vessel ischemic changes within the pons, including a tiny chronic lacunar infarct centrally. Tiny chronic infarct within the right cerebellar hemisphere. Partially empty sella turcica. No evidence of an intracranial mass. No extra-axial fluid collection. No midline shift.  Vascular: Maintained flow voids within the proximal large arterial vessels. Skull and upper cervical spine: No focal suspicious marrow lesion. Incompletely assessed cervical spondylosis. Sinuses/Orbits: No mass or acute finding within the imaged orbits. No significant paranasal sinus disease. Impressions #1 and #2 will be called to the ordering clinician or representative by the Radiologist Assistant, and communication documented in the PACS or [employer]. IMPRESSION: 1. Small acute parenchymal hemorrhage within the left thalamus, unchanged in size from the head CT performed 01/27/2023. 2. Small subacute infarcts within the left frontoparietal white matter, measuring up to 9 mm. 3. Background chronic small vessel ischemic disease, chronic infarcts and chronic hemorrhages as described Electronically Signed   By: Kellie Simmering D.O.   On: 01/28/2023 15:04   ECHOCARDIOGRAM COMPLETE  Result Date: 01/28/2023    ECHOCARDIOGRAM REPORT   Patient Name:   [name] Date of Exam: 01/28/2023 Medical Rec #:  [id number]          Height:       62.0 in Accession #:    [id number]         Weight:       227.0 lb Date of Birth:  [birth year]         BSA:          2.017 m Patient Age:    50 years           BP:           142/77 mmHg Patient Gender: F                  HR:           84 bpm. Exam Location:  Inpatient Procedure: 2D Echo, Cardiac Doppler and Color Doppler Indications:    Stroke I63.9  History:        Patient has prior history of Echocardiogram examinations, most                 recent 05/19/2021. Stroke, Signs/Symptoms:Chest Pain; Risk  Factors:Hypertension, Dyslipidemia and Diabetes. CKD, stage 2.  Sonographer:    Ronny Flurry Referring Phys: Merlín.Osler MCNEILL P [place]  1. Left ventricular ejection fraction, by estimation, is 60 to 65%. The left ventricle has normal function. The left ventricle has no regional wall motion abnormalities. There is mild left ventricular hypertrophy of  the septal segment. Left ventricular diastolic parameters are consistent with Grade I diastolic dysfunction (impaired relaxation).  2. Right ventricular systolic function is normal. The right ventricular size is normal.  3. The mitral valve is normal in structure. No evidence of mitral valve regurgitation. No evidence of mitral stenosis.  4. The aortic valve is tricuspid. There is mild calcification of the aortic valve. Aortic valve regurgitation is not visualized. Aortic valve sclerosis is present, with no evidence of aortic valve stenosis. Aortic valve mean gradient measures 3.0 mmHg. Aortic valve Vmax measures 1.22 m/s.  5. The inferior vena cava is normal in size with greater than 50% respiratory variability, suggesting right atrial pressure of 3 mmHg. Conclusion(s)/Recommendation(s): No intracardiac source of embolism detected on this transthoracic study. Consider a transesophageal echocardiogram to exclude cardiac source of embolism if clinically indicated. FINDINGS  Left Ventricle: Left ventricular ejection fraction, by estimation, is 60 to 65%. The left ventricle has normal function. The left ventricle has no regional wall motion abnormalities. The left ventricular internal cavity size was normal in size. There is  mild left ventricular hypertrophy of the septal segment. Left ventricular diastolic parameters are consistent with Grade I diastolic dysfunction (impaired relaxation). Right Ventricle: The right ventricular size is normal. No increase in right ventricular wall thickness. Right ventricular systolic function is normal. Left Atrium: Left atrial size was normal in size. Right Atrium: Right atrial size was normal in size. Pericardium: There is no evidence of pericardial effusion. Presence of epicardial fat layer. Mitral Valve: The mitral valve is normal in structure. No evidence of mitral valve regurgitation. No evidence of mitral valve stenosis. Tricuspid Valve: The tricuspid valve is normal in  structure. Tricuspid valve regurgitation is not demonstrated. No evidence of tricuspid stenosis. Aortic Valve: The aortic valve is tricuspid. There is mild calcification of the aortic valve. Aortic valve regurgitation is not visualized. Aortic valve sclerosis is present, with no evidence of aortic valve stenosis. Aortic valve mean gradient measures 3.0 mmHg. Aortic valve peak gradient measures 6.0 mmHg. Aortic valve area, by VTI measures 2.85 cm. Pulmonic Valve: The pulmonic valve was normal in structure. Pulmonic valve regurgitation is not visualized. No evidence of pulmonic stenosis. Aorta: The aortic root is normal in size and structure. Venous: The inferior vena cava is normal in size with greater than 50% respiratory variability, suggesting right atrial pressure of 3 mmHg. IAS/Shunts: No atrial level shunt detected by color flow Doppler.  LEFT VENTRICLE PLAX 2D LVIDd:         5.00 cm   Diastology LVIDs:         3.10 cm   LV e' medial:    7.23 cm/s LV PW:         1.10 cm   LV E/e' medial:  9.9 LV IVS:        1.20 cm   LV e' lateral:   6.09 cm/s LVOT diam:     2.10 cm   LV E/e' lateral: 11.8 LV SV:         64 LV SV Index:   32 LVOT Area:     3.46 cm  RIGHT VENTRICLE  IVC RV S prime:     12.80 cm/s  IVC diam: 1.60 cm LEFT ATRIUM             Index LA diam:        3.10 cm 1.54 cm/m LA Vol (A2C):   40.9 ml 20.27 ml/m LA Vol (A4C):   52.4 ml 25.97 ml/m LA Biplane Vol: 49.0 ml 24.29 ml/m  AORTIC VALVE AV Area (Vmax):    2.87 cm AV Area (Vmean):   2.76 cm AV Area (VTI):     2.85 cm AV Vmax:           122.00 cm/s AV Vmean:          82.100 cm/s AV VTI:            0.225 m AV Peak Grad:      6.0 mmHg AV Mean Grad:      3.0 mmHg LVOT Vmax:         101.23 cm/s LVOT Vmean:        65.400 cm/s LVOT VTI:          0.185 m LVOT/AV VTI ratio: 0.82  AORTA Ao Root diam: 3.00 cm Ao Asc diam:  2.60 cm MITRAL VALVE MV Area (PHT): 3.65 cm    SHUNTS MV Decel Time: 208 msec    Systemic VTI:  0.19 m MV E velocity: 71.60  cm/s  Systemic Diam: 2.10 cm MV A velocity: 97.30 cm/s MV E/A ratio:  0.74 Candee Furbish MD Electronically signed by Candee Furbish MD Signature Date/Time: 01/28/2023/12:44:14 PM    Final    CT Head Wo Contrast  Result Date: 01/27/2023 CLINICAL DATA:  Neurologic deficit EXAM: CT HEAD WITHOUT CONTRAST TECHNIQUE: Contiguous axial images were obtained from the base of the skull through the vertex without intravenous contrast. RADIATION DOSE REDUCTION: This exam was performed according to the departmental dose-optimization program which includes automated exposure control, adjustment of the mA and/or kV according to patient size and/or use of iterative reconstruction technique. COMPARISON:  None Available. FINDINGS: Brain: There is a small intraparenchymal hematoma in the left thalamus measuring 12 x 9 x 8 mm (<1 mL). No midline shift or other mass effect. Old infarct of the right basal ganglia. There is periventricular hypoattenuation compatible with chronic microvascular disease. Vascular: Calcification of the internal carotid arteries at the skull base. Skull: Negative Sinuses/Orbits: Paranasal sinuses and mastoid air cells are clear. Normal orbits. Other: None IMPRESSION: 1. Small intraparenchymal hematoma in the left thalamus, measuring 12 x 9 x 8 mm (<1 mL). No midline shift or other mass effect. 2. Old infarct of the right basal ganglia. Critical Value/emergent results were called by telephone at the time of interpretation on 01/27/2023 at 7:40 pm to provider Carmin Muskrat , who verbally acknowledged these results. Electronically Signed   By: Ulyses Jarred M.D.   On: 01/27/2023 19:40       HISTORY OF PRESENT ILLNESS [name] is a 67 y.o. 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  She states that it started around 4 PM and the numbness has been persistent since that time.  The tingling has  improved.  Due to the symptoms, she sought care in the emergency department where she had a head CT performed which demonstrates a small intraparenchymal hematoma.  She has been very hypertensive with systolics in the [id number].  She states that she has been compliant  with her antihypertensives, but she was supposed to start a new one soon and has not had a chance to start it yet.   She also notes that she had a transient episode of diplopia earlier today, this is resolved.     LKW: 4 PM tpa given?: No, ICH IR Thrombectomy? No, no LVO Modified Rankin Scale: 1-No significant post stroke disability and can perform usual duties with stroke symptoms NIHSS: 3 ICH score: [name] Patient with a history of left thalamic infarct, hypertension and right temporal IPH presented with right-sided numbness tingling and a transient episode of diplopia.  Upon admission, patient was found to have a small left thalamic ICH.  Her blood pressure was controlled with Cleviprex, and then she was transitioned to p.o. blood pressure meds.  She did have some left ankle swelling and pain, was seen by orthopedics and has been treated for gout with colchicine and prednisone with improvement.  She is now stable and ready to be discharged to rehabilitation.  ICH:  left thalamic ICH, likely Hypertension Stroke: left frontoparietal WM infarcts, likely small vessel disease CT head small IPH in left thalamus MRI stable small left thalamic IPH, small subacute ischemic infarcts in left frontoparietal white matter MRA no LVO.  Cranial atherosclerosis small M2 severe long segment stenosis.  Severe stenosis within right M2 progressive to moderate to severe stenosis of left posterior P2 2 mm inferiorly projecting vascular protrusion arising from the cavernous left internal carotid artery may reflect an aneurysm Carotid Doppler negative 2D Echo 60 to 65%  LDL 171 HgbA1c 7.1 UDS negative VTE prophylaxis -SCDs No  antithrombotic prior to admission, now on No antithrombotic secondary to IPH.  Consider restarting 81 mg aspirin in 2 weeks after repeat CT head   History of ICH and stroke 12/2017 admitted for fall with left-sided weakness.  CT showed right BG ICH.  CT head and neck unremarkable.  EF 60 to 65%.  Carotid Doppler negative.  UDS negative.  LDL 118.  With residual left-sided numbness 08/2019 follow-up with [facility], put on aspirin 81. 05/2021 admitted for right thalamic infarct.  MRI showed right left M2 occlusion and left M2 stenosis.  EF 60 to 65%, LDL 152, A1c 7.2, discharged on DAPT and Lipitor 80.   Hypertension Home meds: coreg 6.25 mg daily, lisinopril-hydrochlorothiazide 20-12.5 mg daily Stable off Cleviprex Resume home coreg Start amlodipine 10 mg daily Hold off ACEI and diuretics due to elevated Cre BP goal less than 160 Long-term BP goal normotensive   Hyperlipidemia Home meds: None LDL 152, goal < 70 Put on crestor 20 Initiate statin at discharge   Diabetes type II Controlled Home meds: Exenatide 2 mg weekly HgbA1c 7.1, goal < 7.0 CBGs SSI Close PCP follow-up as outpatient.   AKI on CKD 3b creatinine 1.72->2.10->1.78->1.98 Encourage po intake on IVF @ 75   Left ankle pain / swollen Left ankle pain -x-ray with mild soft tissue swelling without acute bony abnormality  Ultrasound left lower extremity no DVT Ortho consulted, concerning for gout but not rule out cellulitis  Will start gout treatment - colchicine 0.6 daily and prednisone 40 mg x 3 days -outpatient follow-up with PCP to consider allopurinol Will start ancef 1g Q8h for 5 days Symptoms much improved overnight Close monitoring   other Stroke Risk Factors Advanced Age >/= 45  Obesity, Body mass index is 41.52 kg/m., BMI >/= 30 associated with increased stroke risk, recommend weight loss, diet and exercise as appropriate  Other Active Problems Renal cell carcinoma status post left  nephrectomy IBS  DISCHARGE EXAM Blood pressure (!) 159/71, pulse 82, temperature 98.2 F (36.8 C), temperature source Oral, resp. rate 18, height 5\' 2"  (1.575 m), weight 103 kg, last menstrual period 11/16/2002, SpO2 100 %. General: Alert, well-nourished, well-developed patient in no acute distress. Respiratory regular, unlabored respirations on room air Extremities: Slight edema and tenderness to touch noted to left ankle.  NEURO:  Mental Status: AA&Ox3  Speech/Language: speech is without dysarthria or aphasia.    Cranial Nerves:  II: PERRL.  Right lower quadrantanopsia III, IV, VI: EOMI. Eyelids elevate symmetrically.  V: Sensation is intact to light touch and diminished on the right VII: Smile is symmetrical.  VIII: hearing intact to voice. IX, X: Phonation is normal.  [id number] shrug 5/5. XII: tongue is midline without fasciculations. Motor: 5/5 strength to all muscle groups tested.  Tone: is normal and bulk is normal Sensation- Intact to light touch bilaterally.  But slightly diminished on the right Coordination: FTN intact bilaterally, slight drift in right arm Gait- deferred    Discharge Diet      Diet   Diet heart healthy/carb modified Room service appropriate? Yes; Fluid consistency: Thin   liquids  DISCHARGE PLAN Disposition:  Transfer to [place] for ongoing PT, OT and ST No antithrombotic for secondary stroke prevention due to [place], consider restarting aspirin 81 mg daily in 2 weeks. Recommend ongoing stroke risk factor control by Primary Care Physician at time of discharge from inpatient rehabilitation. Follow-up PCP Bernerd Limbo, MD in 2 weeks following discharge from rehab. Follow-up in [facility] in 4 weeks following discharge from rehab, office to schedule an appointment.   35 minutes were spent preparing discharge.  [place] , MSN, AGACNP-BC [facility] See Amion for  schedule and pager information 01/31/2023 11:39 AM   ATTENDING NOTE: I reviewed above note and agree with the assessment and plan. Pt was seen and examined.   No family at bedside.  Patient sitting in bed for lunch.  Neuro stable, stated that left ankle pain has resolved.  Started colchicine, prednisone and Ancef yesterday, will continue prednisone for another 2 days and Ancef for another 4 days.  Continue colchicine and outpatient follow-up to transition to allopurinol by PCP.  Patient in good condition for CIR admission.  Continue Crestor, repeat CT in 2 weeks, if stable, consider aspirin 81.  Follow-up at [facility] stroke clinic in 4 weeks.  For detailed assessment and plan, please refer to above/below as I have made changes wherever appropriate.   Rosalin Hawking, MD PhD Stroke Neurology 01/31/2023 5:31 PM

## 2023-01-31 NOTE — Inpatient Diabetes Management (Signed)
Inpatient Diabetes Program Recommendations  AACE/ADA: New Consensus Statement on Inpatient Glycemic Control (2015)  Target Ranges:  Prepandial:   less than 140 mg/dL      Peak postprandial:   less than 180 mg/dL (1-2 hours)      Critically ill patients:  140 - 180 mg/dL   Lab Results  Component Value Date   GLUCAP 125 (H) 01/31/2023   HGBA1C 7.1 (H) 01/28/2023    Review of Glycemic Control  Latest Reference Range & Units 01/30/23 07:51 01/30/23 11:22 01/30/23 17:16 01/30/23 20:41 01/30/23 23:20 01/31/23 03:25 01/31/23 08:18  Glucose-Capillary 70 - 99 mg/dL 83 121 (H) 192 (H) 206 (H) 208 (H) 174 (H) 125 (H)   Diabetes history: DM 2 Outpatient Diabetes medications: [place] 2 mg Qsaturday Current orders for Inpatient glycemic control:  Novolog 0-15 units Q4 hours  PO prednisone 40 mg Daily A1c 7.1% on 3/16  Inpatient Diabetes Program Recommendations:    Diet ordered glucose increases after Prednisone dose and meal intake. Elevated renal function.  -   Change Novolog Correction to 0-9 units (sensitive) tid + hs -   Consider Novolog 2 units tid meal coverage if pt consumes at least 50% of meals and glucose is at least 80 mg/dl  Thanks,  Tama Headings RN, MSN, BC-ADM Inpatient Diabetes Coordinator Team Pager [phone] (8a-5p)

## 2023-01-31 NOTE — H&P (Signed)
Physical Medicine and Rehabilitation Admission H&P   CC: Functional deficits secondary to left thalamic ICH etiology likely due to hypertensive urgency as well as small subacute ischemic infarcts in left frontoparietal white matter   HPI: [name] is a 67 year old female who presented to the ED on 01/27/2023 complaining of tingling and numbness of her right side similar to symptoms she had from previous stroke. History of hypertension on HCTZ and lisinopril. CT head consistent with thalamic intraparenchymal hemorrhage. Neurology consulted. Systolic BP range in [id number]. Admitted for BP control and further work-up. 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 AKI with serum creatinine to 2.10. IVFs NS at 75 cc/hr continues. A1c 7.1%. Inpatient diabetic coordinator consulted. She develop acute right foot pain and orthopedic surgery was consulted. Palin films obtained. Started on prednisone and colchicine for acute gout. Added Ancef for possible cellulitis. Light touch diminished in left arm. No antithrombotic prior to admission, now on no antithrombotic secondary to IPH.  Consider restarting 81 mg aspirin in 2 weeks after repeat CT head. Now on amlodipine and carvedilol. The patient requires inpatient physical medicine and rehabilitation evaluations and treatment secondary to dysfunction due to [place] and small subacute left ischemic infarcts.   PMH: History of renal cell carcinoma s/p left nephrectomy. S/p incisional hernia repair with mesh 01/2022. Prior stroke history: right BG ICH in 12/2017. 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 MRI showed right left M2 occlusion and left M2 stenosis. Discharged home on DAPT and Lipitor.  Review of Systems  Constitutional:  Negative for fever.  HENT:  Negative for hearing loss.   Eyes:  Negative for blurred vision and double vision.  Respiratory:  Negative for cough.   Cardiovascular:  Negative for chest  pain and palpitations.  Gastrointestinal:  Negative for heartburn and nausea.  Genitourinary:  Negative for dysuria.  Musculoskeletal:  Negative for myalgias.  Skin:  Negative for rash.  Neurological:  Positive for sensory change, focal weakness and headaches.  Psychiatric/Behavioral:  Negative for depression.    Past Medical History:  Diagnosis Date   Anemia    Blood transfusion 2010   associated with GI bleeding   CAD (coronary artery disease)    CHF (congestive heart failure) (HCC)    CKD (chronic kidney disease) stage 2, GFR 60-89 ml/min    Depression    Diverticulosis of colon with hemorrhage 2010, 09/2011   DM2 (diabetes mellitus, type 2) ([place])    Gout 01/30/2023   Left Foot   H/O hiatal hernia    Helicobacter pylori gastritis 09/13/2013   Hemorrhagic stroke ([place]) 2019   Right Basal Ganglia   Hx: UTI (urinary tract infection)    Hypertension    Intraparenchymal hemorrhage of brain ([place]) 01/27/2023   Morbid obesity with BMI of 40.0-44.9, adult ([place])    Personal history of colonic polyps    adenomas since 2010   PVD (peripheral vascular disease) ([place])    Renal cell carcinoma 2004   lt kidney removed   Stroke ([place]) 12/2017   05/2021   Thalamic stroke ([place]) 05/2021   Right   Venous stasis    Past Surgical History:  Procedure Laterality Date   ABDOMINAL HYSTERECTOMY  2004   BIOPSY  10/23/2019   Procedure: BIOPSY;  Surgeon: Lavena Bullion, DO;  Location: [facility];  Service: Gastroenterology;;   CHOLECYSTECTOMY     COLONOSCOPY  12/19/2008   diverticulosis   COLONOSCOPY  11/17/2011   Procedure: COLONOSCOPY;  Surgeon: Zenovia Jarred, MD;  Location: [facility];  Service: Gastroenterology;  Laterality: N/A;   DILATION AND CURETTAGE OF UTERUS N/A 07/11/2014   Procedure: Repair of Vaginal Cuff;  Surgeon: Melina Schools, MD;  Location: [facility];  Service: Gynecology;  Laterality: N/A;   ESOPHAGOGASTRODUODENOSCOPY (EGD) WITH PROPOFOL N/A 10/23/2019   Procedure:  ESOPHAGOGASTRODUODENOSCOPY (EGD) WITH PROPOFOL;  Surgeon: Lavena Bullion, DO;  Location: [facility];  Service: Gastroenterology;  Laterality: N/A;   FLEXIBLE SIGMOIDOSCOPY  12/12/2008   diverticulosis   HAND SURGERY     x5 right   HYDRADENITIS EXCISION Right 03/21/2014   Procedure: EXCISION HIDRADENITIS AXILLA;  Surgeon: Pedro Earls, MD;  Location: [facility];  Service: General;  Laterality: Right;   KNEE ARTHROSCOPY     x2 left   NEPHRECTOMY  2013   left   TRIGGER FINGER RELEASE Left 06/23/2015   Procedure: RELEASE TRIGGER FINGER/A-1 PULLEY LEFT RING FINGER;  Surgeon: Daryll Brod, MD;  Location: [facility];  Service: Orthopedics;  Laterality: Left;   TUBAL LIGATION  1980   UPPER GASTROINTESTINAL ENDOSCOPY  12/12/2008   gastroporesis   XI ROBOTIC ASSISTED VENTRAL HERNIA N/A 01/21/2022   Procedure: ROBOTIC INCISIONAL HERNIA REPAIR WITH MESH, BILATERAL POSTERIOR RECTUS MYOFASCIAL RELEASE;  Surgeon: Felicie Morn, MD;  Location: [facility];  Service: General;  Laterality: N/A;   Family History  Problem Relation Age of Onset   Colon polyps Mother    Diabetes Maternal Grandmother    Breast cancer Maternal Grandmother    Diabetes Father    Breast cancer Daughter    Colon cancer Neg Hx    Social History:  reports that she has never smoked. She has never used smokeless tobacco. She reports that she does not drink alcohol and does not use drugs. Allergies: No Known Allergies Medications Prior to Admission  Medication Sig Dispense Refill   albuterol (VENTOLIN HFA) 108 (90 Base) MCG/ACT inhaler Inhale 2 puffs into the lungs every 6 (six) hours as needed for wheezing.     Exenatide ER 2 MG PEN Inject 2 mg into the skin every Saturday.     fluticasone (FLONASE) 50 MCG/ACT nasal spray Place 1 spray into both nostrils daily as needed for allergies.     lisinopril-hydrochlorothiazide (ZESTORETIC) 20-12.5 MG tablet Take 2 tablets by mouth daily.      carvedilol (COREG) 6.25 MG tablet Take 6.25 mg by mouth 2 (two) times daily with a meal. (Patient not taking: Reported on 01/27/2023)        Home: Home Living Family/patient expects to be discharged to:: Inpatient rehab Living Arrangements: Other relatives Available Help at Discharge: Family, Available PRN/intermittently Type of Home: Apartment Home Access: Level entry Home Layout: One level Bathroom Shower/Tub: Chiropodist: Standard Home Equipment: Conservation officer, nature (2 wheels), BSC/3in1, Financial controller: Reacher  Lives With: Family (grandson)   Functional History: Prior Function Prior Level of Function : Independent/Modified Independent, Driving Mobility Comments: independent ADLs Comments: stands in shower now (used to use BSC in tub to sit); does housework, goes shopping  Functional Status:  Mobility: Bed Mobility Overal bed mobility: Modified Independent General bed mobility comments: able to get to EOB and back to supine without assistance Transfers Overall transfer level: Needs assistance Equipment used: Rolling walker (2 wheels) Transfers: Sit to/from Stand Sit to Stand: Min assist, +2 physical assistance Bed to/from chair/wheelchair/BSC transfer type:: Step pivot Step pivot transfers: Min assist, +2 physical assistance General transfer comment: declined due  to LLE ankle pain Ambulation/Gait Ambulation/Gait assistance: Min assist, +2 safety/equipment Gait Distance (Feet): 3 Feet Assistive device: Rolling walker (2 wheels) Gait Pattern/deviations: Step-to pattern, Decreased stride length, Wide base of support, Decreased weight shift to left, Antalgic General Gait Details: pt limited by left ankle pain with poor WB through LLE; poor use of bil UEs via Rw to unweight LLE Gait velocity interpretation: <1.31 ft/sec, indicative of household ambulator    ADL: ADL Overall ADL's : Needs assistance/impaired Eating/Feeding: Set up,  Sitting Eating/Feeding Details (indicate cue type and reason): able to open containers Grooming: Set up, Sitting Grooming Details (indicate cue type and reason): on EOB Upper Body Bathing: Minimal assistance, Sitting Lower Body Bathing: Minimal assistance, Sitting/lateral leans, Sit to/from stand, Cueing for safety Upper Body Dressing : Minimal assistance, Sitting Lower Body Dressing: Moderate assistance, Sitting/lateral leans Lower Body Dressing Details (indicate cue type and reason): to donn socks, required assistance to get socks over toes Toilet Transfer: Min guard, Cueing for safety Toileting- Clothing Manipulation and Hygiene: Min guard, Sitting/lateral lean, Bed level Functional mobility during ADLs: Min guard, Cueing for safety, Cueing for sequencing General ADL Comments: limited to EOB due to LLE ankle pain  Cognition: Cognition Overall Cognitive Status: Within Functional Limits for tasks assessed Orientation Level: Oriented X4 Cognition Arousal/Alertness: Awake/alert Behavior During Therapy: WFL for tasks assessed/performed Overall Cognitive Status: Within Functional Limits for tasks assessed General Comments: Passed Short Blessed Test for recall and problem solving.  Physical Exam: Blood pressure (!) 159/71, pulse 82, temperature 98.2 F (36.8 C), temperature source Oral, resp. rate 18, height 5\' 2"  (1.575 m), weight 103 kg, last menstrual period 11/16/2002, SpO2 100 %. Physical Exam Vitals reviewed.  Constitutional:      General: She is not in acute distress.    Appearance: She is obese.  HENT:     Head: Normocephalic and atraumatic.     Right Ear: External ear normal.     Left Ear: External ear normal.     Nose: Nose normal.     Mouth/Throat:     Mouth: Mucous membranes are moist.  Eyes:     Extraocular Movements: Extraocular movements intact.     Pupils: Pupils are equal, round, and reactive to light.  Cardiovascular:     Rate and Rhythm: Normal rate and  regular rhythm.     Heart sounds: No murmur heard.    No gallop.  Pulmonary:     Effort: Pulmonary effort is normal. No respiratory distress.     Breath sounds: No wheezing.  Abdominal:     General: Bowel sounds are normal. There is no distension.     Palpations: Abdomen is soft.     Tenderness: There is no abdominal tenderness.  Musculoskeletal:        General: Swelling and tenderness (left forefoot (improved from yesterday)) present.     Cervical back: Normal range of motion and neck supple.  Skin:    General: Skin is warm and dry.  Neurological:     Mental Status: She is alert.     Comments: Alert and oriented x 3. Normal insight and awareness. Intact Memory. Normal language and speech. Cranial nerve exam unremarkable mild right perioral sensory loss. MMT: RUE 4-/5 prox to 4+/5 distally. LUE 5/5. RLE 3+/5 prox to 4+distal. LLE 4- prox to 4+ distally. Decreased LT right arm and to a lesser extent left forearm. Near normal LT and pain sense RLE. No abnormal resting tone. DTR's 1+    Psychiatric:  Mood and Affect: Mood normal.        Behavior: Behavior normal.     Results for orders placed or performed during the hospital encounter of 01/27/23 (from the past 48 hour(s))  Glucose, capillary     Status: None   Collection Time: 01/29/23 11:54 AM  Result Value Ref Range   Glucose-Capillary 88 70 - 99 mg/dL    Comment: Glucose reference range applies only to samples taken after fasting for at least 8 hours.   Comment 1 Glucose Stabilizer   Glucose, capillary     Status: Abnormal   Collection Time: 01/29/23  4:34 PM  Result Value Ref Range   Glucose-Capillary 106 (H) 70 - 99 mg/dL    Comment: Glucose reference range applies only to samples taken after fasting for at least 8 hours.  Glucose, capillary     Status: Abnormal   Collection Time: 01/29/23  7:57 PM  Result Value Ref Range   Glucose-Capillary 184 (H) 70 - 99 mg/dL    Comment: Glucose reference range applies only to  samples taken after fasting for at least 8 hours.  Glucose, capillary     Status: Abnormal   Collection Time: 01/29/23 11:52 PM  Result Value Ref Range   Glucose-Capillary 123 (H) 70 - 99 mg/dL    Comment: Glucose reference range applies only to samples taken after fasting for at least 8 hours.  Glucose, capillary     Status: Abnormal   Collection Time: 01/30/23  3:47 AM  Result Value Ref Range   Glucose-Capillary 110 (H) 70 - 99 mg/dL    Comment: Glucose reference range applies only to samples taken after fasting for at least 8 hours.  Basic metabolic panel     Status: Abnormal   Collection Time: 01/30/23  6:58 AM  Result Value Ref Range   Sodium 135 135 - 145 mmol/L   Potassium 4.0 3.5 - 5.1 mmol/L   Chloride 104 98 - 111 mmol/L   CO2 24 22 - 32 mmol/L   Glucose, Bld 105 (H) 70 - 99 mg/dL    Comment: Glucose reference range applies only to samples taken after fasting for at least 8 hours.   BUN 32 (H) 8 - 23 mg/dL   Creatinine, Ser 1.78 (H) 0.44 - 1.00 mg/dL   Calcium 8.4 (L) 8.9 - 10.3 mg/dL   GFR, Estimated 31 (L) >60 mL/min    Comment: (NOTE) Calculated using the CKD-EPI Creatinine Equation (2021)    Anion gap 7 5 - 15    Comment: Performed at [facility] [address], [place], [place] [address]  Glucose, capillary     Status: None   Collection Time: 01/30/23  7:51 AM  Result Value Ref Range   Glucose-Capillary 83 70 - 99 mg/dL    Comment: Glucose reference range applies only to samples taken after fasting for at least 8 hours.  Glucose, capillary     Status: Abnormal   Collection Time: 01/30/23 11:22 AM  Result Value Ref Range   Glucose-Capillary 121 (H) 70 - 99 mg/dL    Comment: Glucose reference range applies only to samples taken after fasting for at least 8 hours.  Glucose, capillary     Status: Abnormal   Collection Time: 01/30/23  5:16 PM  Result Value Ref Range   Glucose-Capillary 192 (H) 70 - 99 mg/dL    Comment: Glucose reference range applies only  to samples taken after fasting for at least 8 hours.  Glucose,  capillary     Status: Abnormal   Collection Time: 01/30/23  8:41 PM  Result Value Ref Range   Glucose-Capillary 206 (H) 70 - 99 mg/dL    Comment: Glucose reference range applies only to samples taken after fasting for at least 8 hours.   Comment 1 Notify RN   Glucose, capillary     Status: Abnormal   Collection Time: 01/30/23 11:20 PM  Result Value Ref Range   Glucose-Capillary 208 (H) 70 - 99 mg/dL    Comment: Glucose reference range applies only to samples taken after fasting for at least 8 hours.  Basic metabolic panel     Status: Abnormal   Collection Time: 01/31/23  2:41 AM  Result Value Ref Range   Sodium 135 135 - 145 mmol/L   Potassium 4.2 3.5 - 5.1 mmol/L   Chloride 103 98 - 111 mmol/L   CO2 20 (L) 22 - 32 mmol/L   Glucose, Bld 195 (H) 70 - 99 mg/dL    Comment: Glucose reference range applies only to samples taken after fasting for at least 8 hours.   BUN 38 (H) 8 - 23 mg/dL   Creatinine, Ser 1.98 (H) 0.44 - 1.00 mg/dL   Calcium 8.3 (L) 8.9 - 10.3 mg/dL   GFR, Estimated 28 (L) >60 mL/min    Comment: (NOTE) Calculated using the CKD-EPI Creatinine Equation (2021)    Anion gap 12 5 - 15    Comment: Performed at [facility] [address] [place], [place] [address]  Glucose, capillary     Status: Abnormal   Collection Time: 01/31/23  3:25 AM  Result Value Ref Range   Glucose-Capillary 174 (H) 70 - 99 mg/dL    Comment: Glucose reference range applies only to samples taken after fasting for at least 8 hours.  Glucose, capillary     Status: Abnormal   Collection Time: 01/31/23  8:18 AM  Result Value Ref Range   Glucose-Capillary 125 (H) 70 - 99 mg/dL    Comment: Glucose reference range applies only to samples taken after fasting for at least 8 hours.   Comment 1 Notify RN    Comment 2 Document in Chart    DG Ankle Complete Left  Result Date: 01/30/2023 CLINICAL DATA:  Left anterior ankle pain for 1  day, initial encounter EXAM: LEFT ANKLE COMPLETE - 3+ VIEW COMPARISON:  None Available. FINDINGS: No acute fracture or dislocation is noted. Mild tarsal degenerative changes are seen. Mild soft tissue swelling is noted. IMPRESSION: Mild soft tissue swelling without acute bony abnormality. Electronically Signed   By: Inez Catalina M.D.   On: 01/30/2023 10:46      Blood pressure (!) 159/71, pulse 82, temperature 98.2 F (36.8 C), temperature source Oral, resp. rate 18, height 5\' 2"  (1.575 m), weight 103 kg, last menstrual period 11/16/2002, SpO2 100 %.  Medical Problem List and Plan: 1. Functional deficits secondary to left thalamic ICH d/t hypertension with additional subacute infarcts in the left fronto-parietal white matter infarcts d/t small vessel disease  -patient may shower  -ELOS/Goals: 7-11 days, mod I to supervision  2.  Antithrombotics: -DVT/anticoagulation:  Mechanical:  Antiembolism stockings, knee (TED hose) Bilateral lower extremities  -antiplatelet therapy:  Consider restarting 81 mg aspirin in 2 weeks after repeat CT head  3. Pain Management: Tylenol as needed  4. Mood/Behavior/Sleep: LCSW to evaluate and provide emotional support  -antipsychotic agents: n/a  5. Neuropsych/cognition: This patient is capable of making decisions on her  own behalf.  6. Skin/Wound Care: Routine skin care checks   7. Fluids/Electrolytes/Nutrition: Routine Is and Os and follow-up chemistries  8: Hypertension: monitor TID and prn  -continue Coreg 6.25 mg BID  -continue Norvasc 10 mg daily  -bp currently borderline. May be related to prednisone 9: Hyperlipidemia: continue statin  10: Acute left forefoot gout +/- cellulitis: already appears improved today -continue colchicine -continue prednisone 40 mg daily through 3/21 -Ancef 1 mg q 8 hours through 3/23. Need? This doesn't appear to be cellulitis  -consider MRI with and without contrast if no improvement per ortho  11: GERD/HH/hx of GI  bleed, H. pylori: continue Protonix 40 mg daily  12: DM: CBGs q AC and q HS; home med [place] 2 mg q Saturday  -continue carb modified diet;  prednisone elevating cbg's  -per diabetic manager 3/19: change Novolog correction to 0-9 units from 0-15 and consider Novolog 2 units TID meal coverage if eats >50% meal   13: AKI atop CKD stage II (unknown SCr baseline). History of renal cell carcinoma s/p left nephrectomy  -continue IVFs and follow-up BMP  -encourage PO liquids  14: Class 3 obesity: BMI = 41.52     Barbie Banner, PA-C 01/31/2023

## 2023-01-31 NOTE — Progress Notes (Signed)
Physical Therapy Treatment Patient Details Name: [name] MRN: [id number] DOB: February 02, 1956 Today's Date: 01/31/2023   History of Present Illness 67 y.o. 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  CT performed which demonstrates a small left intraparenchymal hematoma; SBP 200s; 3/18 with left foot/ankle pain    PT Comments    Pt with improved L ankle pain, although continues with antalgic gait pattern and increased edema. Pt ambulating ~50 ft with a walker, utilizing a step to pattern and requiring min assist for balance. Report continued difficulty with R handed grip. Will benefit from AIR to address deficits and maximize functional mobility.     Recommendations for follow up therapy are one component of a multi-disciplinary discharge planning process, led by the attending physician.  Recommendations may be updated based on patient status, additional functional criteria and insurance authorization.  Follow Up Recommendations  Acute inpatient rehab (3hours/day)     Assistance Recommended at Discharge Intermittent Supervision/Assistance  Patient can return home with the following Assistance with cooking/housework;Assist for transportation;A lot of help with walking and/or transfers   Equipment Recommendations  None recommended by PT    Recommendations for Other Services Rehab consult     Precautions / Restrictions Precautions Precautions: Fall Restrictions Weight Bearing Restrictions: No     Mobility  Bed Mobility Overal bed mobility: Needs Assistance Bed Mobility: Supine to Sit, Sit to Supine     Supine to sit: Modified independent (Device/Increase time) Sit to supine: Min assist   General bed mobility comments: MinA for RLE assist to elevate back into bed    Transfers Overall transfer level: Needs assistance Equipment used: Rolling walker (2 wheels) Transfers: Sit  to/from Stand Sit to Stand: Min assist           General transfer comment: Light minA to rise from edge of bed    Ambulation/Gait Ambulation/Gait assistance: Min assist Gait Distance (Feet): 50 Feet Assistive device: Rolling walker (2 wheels) Gait Pattern/deviations: Step-to pattern, Decreased stride length, Wide base of support, Decreased weight shift to left, Antalgic       General Gait Details: Antalgic gait pattern, with increased L foot external rotation and decreased weightbearing. MinA for balance. Slow and effortful gait pattern   Stairs             Wheelchair Mobility    Modified Rankin (Stroke Patients Only) Modified Rankin (Stroke Patients Only) Pre-Morbid Rankin Score: Slight disability Modified Rankin: Moderately severe disability     Balance Overall balance assessment: Needs assistance Sitting-balance support: No upper extremity supported, Feet supported Sitting balance-Leahy Scale: Good     Standing balance support: Bilateral upper extremity supported Standing balance-Leahy Scale: Poor                              Cognition Arousal/Alertness: Awake/alert Behavior During Therapy: WFL for tasks assessed/performed Overall Cognitive Status: Within Functional Limits for tasks assessed                                          Exercises      General Comments        Pertinent Vitals/Pain Pain Assessment Pain Assessment: Faces Faces Pain Scale: Hurts a little bit Pain Location: left ankle Pain Descriptors / Indicators: Grimacing, Guarding, Sharp Pain  Intervention(s): Monitored during session    Home Living                          Prior Function            PT Goals (current goals can now be found in the care plan section) Acute Rehab PT Goals Patient Stated Goal: be able to return home Time For Goal Achievement: 02/11/23 Potential to Achieve Goals: Good Progress towards PT goals: Progressing  toward goals    Frequency    Min 4X/week      PT Plan Current plan remains appropriate    Co-evaluation              AM-PAC PT "6 Clicks" Mobility   Outcome Measure  Help needed turning from your back to your side while in a flat bed without using bedrails?: None Help needed moving from lying on your back to sitting on the side of a flat bed without using bedrails?: None Help needed moving to and from a bed to a chair (including a wheelchair)?: A Little Help needed standing up from a chair using your arms (e.g., wheelchair or bedside chair)?: A Little Help needed to walk in hospital room?: A Little Help needed climbing 3-5 steps with a railing? : A Lot 6 Click Score: 19    End of Session Equipment Utilized During Treatment: Gait belt Activity Tolerance: Patient tolerated treatment well Patient left: with call bell/phone within reach;in bed;with bed alarm set Nurse Communication: Mobility status PT Visit Diagnosis: Unsteadiness on feet (R26.81);Difficulty in walking, not elsewhere classified (R26.2)     Time: [id number] PT Time Calculation (min) (ACUTE ONLY): 26 min  Charges:  $Therapeutic Activity: 23-37 mins                     Wyona Almas, PT, DPT Acute Rehabilitation Services Office [place] 01/31/2023, 2:40 PM

## 2023-01-31 NOTE — Progress Notes (Signed)
Inpatient Rehabilitation Admission Medication Review by a Pharmacist  A complete drug regimen review was completed for this patient to identify any potential clinically significant medication issues.  High Risk Drug Classes Is patient taking? Indication by Medication  Antipsychotic Yes Compazine - nausea  Anticoagulant No   Antibiotic Yes, as an intravenous medication Cefazolin x 5 days started 01/30/23- possible cellulitis  Opioid No   Antiplatelet No   Hypoglycemics/insulin Yes Sliding scale Insulin- DM  Vasoactive Medication Yes  Amlodipine, coreg, HTN  Chemotherapy No   Other Yes Colchicine, prednisone gout Methocarbamol- muscle spasms Protonix-GERD Rosuvastatin-HLD Trazodone - Sleep     Type of Medication Issue Identified Description of Issue Recommendation(s)  Drug Interaction(s) (clinically significant)     Duplicate Therapy     Allergy     No Medication Administration End Date     Incorrect Dose     Additional Drug Therapy Needed     Significant med changes from prior encounter (inform family/care partners about these prior to discharge).  PTA meds not resumed:  Plavix,  Lisinopril-HCT,  Exenatide ER inj pen.   Restart PTA meds when and if necessary during CIR admission or at time of discharge, if warranted.  [facility] acute care discharge summary : Neurology noted No antithrombotic for secondary stroke prevention due to [place],  and to consider restarting aspirin 81 mg daily in 2 weeks.---- NOTE: was not reported taking  aspirin PTA of  acute care admit.    Other       Clinically significant medication issues were identified that warrant physician communication and completion of prescribed/recommended actions by midnight of the next day:  No  Name of provider notified for urgent issues identified:   Provider Method of Notification:   Pharmacist comments:   Time spent performing this drug regimen review (minutes):  30  Thank you for allowing pharmacy to be part of  this patients care team.  Nicole Cella, RPh Clinical Pharmacist 01/31/2023 4:18 PM

## 2023-02-01 DIAGNOSIS — I610 Nontraumatic intracerebral hemorrhage in hemisphere, subcortical: Secondary | ICD-10-CM | POA: Diagnosis not present

## 2023-02-01 LAB — CBC WITH DIFFERENTIAL/PLATELET
Abs Immature Granulocytes: 0.04 10*3/uL (ref 0.00–0.07)
Basophils Absolute: 0 10*3/uL (ref 0.0–0.1)
Basophils Relative: 0 %
Eosinophils Absolute: 0 10*3/uL (ref 0.0–0.5)
Eosinophils Relative: 0 %
HCT: 35.2 % — ABNORMAL LOW (ref 36.0–46.0)
Hemoglobin: 11.6 g/dL — ABNORMAL LOW (ref 12.0–15.0)
Immature Granulocytes: 0 %
Lymphocytes Relative: 15 %
Lymphs Abs: 1.7 10*3/uL (ref 0.7–4.0)
MCH: 28.7 pg (ref 26.0–34.0)
MCHC: 33 g/dL (ref 30.0–36.0)
MCV: 87.1 fL (ref 80.0–100.0)
Monocytes Absolute: 0.8 10*3/uL (ref 0.1–1.0)
Monocytes Relative: 8 %
Neutro Abs: 8.5 10*3/uL — ABNORMAL HIGH (ref 1.7–7.7)
Neutrophils Relative %: 77 %
Platelets: 222 10*3/uL (ref 150–400)
RBC: 4.04 MIL/uL (ref 3.87–5.11)
RDW: 13.9 % (ref 11.5–15.5)
WBC: 11.1 10*3/uL — ABNORMAL HIGH (ref 4.0–10.5)
nRBC: 0 % (ref 0.0–0.2)

## 2023-02-01 LAB — COMPREHENSIVE METABOLIC PANEL
ALT: 13 U/L (ref 0–44)
AST: 16 U/L (ref 15–41)
Albumin: 1.8 g/dL — ABNORMAL LOW (ref 3.5–5.0)
Alkaline Phosphatase: 67 U/L (ref 38–126)
Anion gap: 6 (ref 5–15)
BUN: 33 mg/dL — ABNORMAL HIGH (ref 8–23)
CO2: 21 mmol/L — ABNORMAL LOW (ref 22–32)
Calcium: 8.2 mg/dL — ABNORMAL LOW (ref 8.9–10.3)
Chloride: 110 mmol/L (ref 98–111)
Creatinine, Ser: 1.57 mg/dL — ABNORMAL HIGH (ref 0.44–1.00)
GFR, Estimated: 36 mL/min — ABNORMAL LOW (ref >60)
Glucose, Bld: 135 mg/dL — ABNORMAL HIGH (ref 70–99)
Potassium: 3.9 mmol/L (ref 3.5–5.1)
Sodium: 137 mmol/L (ref 135–145)
Total Bilirubin: 0.3 mg/dL (ref 0.3–1.2)
Total Protein: 5.7 g/dL — ABNORMAL LOW (ref 6.5–8.1)

## 2023-02-01 LAB — GLUCOSE, CAPILLARY
Glucose-Capillary: 115 mg/dL — ABNORMAL HIGH (ref 70–99)
Glucose-Capillary: 156 mg/dL — ABNORMAL HIGH (ref 70–99)
Glucose-Capillary: 92 mg/dL (ref 70–99)
Glucose-Capillary: 213 mg/dL — ABNORMAL HIGH (ref 70–99)
Glucose-Capillary: 186 mg/dL — ABNORMAL HIGH (ref 70–99)

## 2023-02-01 NOTE — Progress Notes (Signed)
Physical Therapy Session Note  Patient Details  Name: Nicholas Mayweather MRN: [id number] Date of Birth: 27-Nov-1955  Today's Date: 02/01/2023 PT Individual Time: 1402-1500 PT Individual Time Calculation (min): 58 min   Short Term Goals: Week 1:  PT Short Term Goal 1 (Week 1): Pt will perform transfer w/ CGA PT Short Term Goal 2 (Week 1): Pt will ambulate 150 ft w/ SBA PT Short Term Goal 3 (Week 1): Pt will initiate 6MWT  Skilled Therapeutic Interventions/Progress Updates:  Pt received supine in bed and agreeable to PT services. Pt connected to IV pole and remained connected w/ therapist managing IV pole throughout session.  Pt transferred to EOB w/ supervision using Bed rails for assistance. Pt requested to use the restroom. Sit<>stand w/ supervision and ambulated into the bathroom w/ CGA. Pt continent of bladder and independent w/ pericare and LE dressing. Pt ambulated out of the bathroom w/ RW and CGA and sat in WC. At this time, Nurse tech entered to assess pt's vitals.    Pt transported to ortho gym dependently for energy conservation. Performed car transfer w/ CGA and ascended/descended ramp using HR w/ CGA. Cueing to reduce speed upon descent was demonstrated well. 5xSTS outcome measure performed w/ pt sitting at [facility]. Pt competed task in 13.76 s <15 s is indicative of an individual being at a decreased risk of falls.   Pt performed ~250 ft of gait back to room w/ CGA + WC follow. Vc provided for pt to increase B knee flexion to improve foot clearance. Pt returned to room and was very fatigued. Pt returned to supine in bed and required Min A for LE stability to scoot to [facility]. Pt left w/ bed alarm on, call bell in reach, and all needs met.    Therapy Documentation Precautions:  Precautions Precautions: Fall Restrictions Weight Bearing Restrictions: No General:  Pain: Pain Assessment Pain Scale: 0-10 Pain Score: 0-No pain       Therapy/Group: Individual Therapy  Johntae Broxterman 02/01/2023, 5:41 PM

## 2023-02-01 NOTE — Evaluation (Signed)
Occupational Therapy Assessment and Plan  Patient Details  Name: [name] MRN: [id number] Date of Birth: August 28, 1956  OT Diagnosis: hemiplegia affecting dominant side Rehab Potential: Rehab Potential (ACUTE ONLY): Excellent ELOS: 8-10 days   Today's Date: 02/01/2023 OT Individual Time: 1030-1130 OT Individual Time Calculation (min): 60 min     Hospital Problem: Principal Problem:   ICH (intracerebral hemorrhage) ([place])   Past Medical History:  Past Medical History:  Diagnosis Date   Anemia    Blood transfusion 2010   associated with GI bleeding   CAD (coronary artery disease)    CHF (congestive heart failure) (HCC)    CKD (chronic kidney disease) stage 2, GFR 60-89 ml/min    Depression    Diverticulosis of colon with hemorrhage 2010, 09/2011   DM2 (diabetes mellitus, type 2) ([place])    Gout 01/30/2023   Left Foot   H/O hiatal hernia    Helicobacter pylori gastritis 09/13/2013   Hemorrhagic stroke ([place]) 2019   Right Basal Ganglia   Hx: UTI (urinary tract infection)    Hypertension    Intraparenchymal hemorrhage of brain ([place]) 01/27/2023   Morbid obesity with BMI of 40.0-44.9, adult ([place])    Personal history of colonic polyps    adenomas since 2010   PVD (peripheral vascular disease) ([place])    Renal cell carcinoma 2004   lt kidney removed   Stroke ([place]) 12/2017   05/2021   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   Venous stasis    Past Surgical History:  Past Surgical History:  Procedure Laterality Date   ABDOMINAL HYSTERECTOMY  2004   BIOPSY  10/23/2019   Procedure: BIOPSY;  Surgeon: Lavena Bullion, DO;  Location: [facility];  Service: Gastroenterology;;   CHOLECYSTECTOMY     COLONOSCOPY  12/19/2008   diverticulosis   COLONOSCOPY  11/17/2011   Procedure: COLONOSCOPY;  Surgeon: Zenovia Jarred, MD;  Location: [facility];  Service: Gastroenterology;  Laterality: N/A;   DILATION AND CURETTAGE OF UTERUS N/A 07/11/2014   Procedure: Repair of Vaginal Cuff;   Surgeon: Melina Schools, MD;  Location: [facility];  Service: Gynecology;  Laterality: N/A;   ESOPHAGOGASTRODUODENOSCOPY (EGD) WITH PROPOFOL N/A 10/23/2019   Procedure: ESOPHAGOGASTRODUODENOSCOPY (EGD) WITH PROPOFOL;  Surgeon: Lavena Bullion, DO;  Location: [facility];  Service: Gastroenterology;  Laterality: N/A;   FLEXIBLE SIGMOIDOSCOPY  12/12/2008   diverticulosis   HAND SURGERY     x5 right   HYDRADENITIS EXCISION Right 03/21/2014   Procedure: EXCISION HIDRADENITIS AXILLA;  Surgeon: Pedro Earls, MD;  Location: [facility];  Service: General;  Laterality: Right;   KNEE ARTHROSCOPY     x2 left   NEPHRECTOMY  2013   left   TRIGGER FINGER RELEASE Left 06/23/2015   Procedure: RELEASE TRIGGER FINGER/A-1 PULLEY LEFT RING FINGER;  Surgeon: Daryll Brod, MD;  Location: [facility];  Service: Orthopedics;  Laterality: Left;   TUBAL LIGATION  1980   UPPER GASTROINTESTINAL ENDOSCOPY  12/12/2008   gastroporesis   XI ROBOTIC ASSISTED VENTRAL HERNIA N/A 01/21/2022   Procedure: ROBOTIC INCISIONAL HERNIA REPAIR WITH MESH, BILATERAL POSTERIOR RECTUS MYOFASCIAL RELEASE;  Surgeon: Felicie Morn, MD;  Location: [facility];  Service: General;  Laterality: N/A;    Assessment & Plan Clinical Impression: .: [name] is a 67 year old female who presented to the ED on 01/27/2023 complaining of tingling and numbness of her right side similar to symptoms she had from previous stroke. History of hypertension on  HCTZ and lisinopril. CT head consistent with thalamic intraparenchymal hemorrhage. Neurology consulted. Systolic BP range in [id number]. Admitted for BP control and further work-up. 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 AKI with serum creatinine to 2.10. IVFs NS at 75 cc/hr continues. A1c 7.1%. Inpatient diabetic coordinator consulted. She develop acute right foot pain and orthopedic surgery was  consulted. [place] films obtained. Started on prednisone and colchicine for acute gout. Added Ancef for possible cellulitis. Light touch diminished in left arm. No antithrombotic prior to admission, now on no antithrombotic secondary to IPH.  Consider restarting 81 mg aspirin in 2 weeks after repeat CT head. Now on amlodipine and carvedilol. The patient requires inpatient physical medicine and rehabilitation evaluations and treatment secondary to dysfunction due to [place] and small subacute left ischemic infarcts.   PMH: History of renal cell carcinoma s/p left nephrectomy. S/p incisional hernia repair with mesh 01/2022. Prior stroke history: right BG ICH in 12/2017. 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 MRI showed right left M2 occlusion and left M2 stenosis. Discharged home on DAPT and Lipitor.    Patient transferred to CIR on 01/31/2023 .    Patient currently requires min with basic self-care skills secondary to unbalanced muscle activation, decreased coordination, and decreased sensation  and decreased standing balance and hemiplegia.  Prior to hospitalization, patient could complete ADLS and IADLs with modified independent .  Patient will benefit from skilled intervention to increase independence with basic self-care skills prior to discharge home with care partner.  Anticipate patient will require intermittent supervision and follow up home health.  OT - End of Session Activity Tolerance: Tolerates 30+ min activity with multiple rests Endurance Deficit: Yes OT Assessment Rehab Potential (ACUTE ONLY): Excellent OT Patient demonstrates impairments in the following area(s): Balance;Motor;Sensory OT Basic ADL's Functional Problem(s): Bathing;Dressing;Toileting OT Advanced ADL's Functional Problem(s): Simple Meal Preparation;Light Housekeeping OT Transfers Functional Problem(s): Toilet;Tub/Shower OT Additional Impairment(s): Fuctional Use of Upper Extremity OT Plan OT Intensity: Minimum of 1-2 x/day, 45 to 90  minutes OT Frequency: 5 out of 7 days OT Duration/Estimated Length of Stay: 8-10 days OT Treatment/Interventions: Teacher, English as a foreign language;Discharge planning;Functional mobility training;Neuromuscular re-education;Psychosocial support;Patient/family education;Pain management;Self Care/advanced ADL retraining;UE/LE Strength taining/ROM;Therapeutic Exercise;Therapeutic Activities;UE/LE Coordination activities OT Self Feeding Anticipated Outcome(s): independent OT Basic Self-Care Anticipated Outcome(s): Mod Independent OT Toileting Anticipated Outcome(s): Mod Independent OT Bathroom Transfers Anticipated Outcome(s): supervision to transfer to garden tub, Mod I to toilet OT Recommendation Patient destination: Home Follow Up Recommendations: Home health OT Equipment Recommended: To be determined   OT Evaluation Precautions/Restrictions  Restrictions Weight Bearing Restrictions: No Pain Pain Assessment Pain Score: 0-No pain Home Living/Prior Functioning Home Living Available Help at Discharge: Family, Available PRN/intermittently Type of Home: Apartment Home Access: Level entry Home Layout: One level Bathroom Shower/Tub: Tub/shower unit (garden tub) Biochemist, clinical: Standard  Lives With: Family Prior Function Level of Independence: Independent with basic ADLs, Independent with homemaking with ambulation, Independent with gait, Requires assistive device for independence (RW) Driving: No Vocation: Retired Surveyor, mining Baseline Vision/History: 1 Wears glasses Ability to See in Adequate Light: 0 Adequate Patient Visual Report: No change from baseline Vision Assessment?: No apparent visual deficits Eye Alignment: Within Functional Limits Ocular Range of Motion: Within Functional Limits Perception  Perception: Within Functional Limits Praxis Praxis: Intact Cognition Cognition Overall Cognitive Status: Within Functional Limits for tasks  assessed Arousal/Alertness: Awake/alert Orientation Level: Person;Place;Situation Person: Oriented Place: Oriented Situation: Oriented Memory: Appears intact Awareness: Appears intact Problem Solving: Appears intact  Safety/Judgment: Appears intact Brief Interview for Mental Status (BIMS) Repetition of Three Words (First Attempt): 3 Temporal Orientation: Year: Correct Temporal Orientation: Month: Accurate within 5 days Temporal Orientation: Day: Correct Recall: "Sock": Yes, no cue required Recall: "Blue": Yes, no cue required Recall: "Bed": Yes, no cue required BIMS Summary Score: 15 Sensation Sensation Light Touch: Impaired by gross assessment Hot/Cold: Appears Intact Proprioception: Appears Intact Stereognosis: Impaired by gross assessment Coordination Gross Motor Movements are Fluid and Coordinated: No Fine Motor Movements are Fluid and Coordinated: No Coordination and Movement Description: Decreased motor coordination Finger Nose Finger Test: WFL B sides Motor  Motor Motor: Hemiplegia Motor - Skilled Clinical Observations: R side weakness  Trunk/Postural Assessment  Postural Control Postural Control: Within Functional Limits  Balance Dynamic Sitting Balance Dynamic Sitting - Level of Assistance: 5: Stand by assistance Static Standing Balance Static Standing - Level of Assistance: 5: Stand by assistance Dynamic Standing Balance Dynamic Standing - Level of Assistance: 4: Min assist Extremity/Trunk Assessment RUE Assessment Active Range of Motion (AROM) Comments: WFL General Strength Comments: 4-/5 shoulder, hand 5/5 LUE Assessment Active Range of Motion (AROM) Comments: WFL (slight limitation with full sh flexion overhead) General Strength Comments: 4/5  Care Tool Care Tool Self Care Eating   Eating Assist Level: Set up assist    Oral Care    Oral Care Assist Level: Set up assist    Bathing   Body parts bathed by patient: Right arm;Left  arm;Chest;Abdomen;Front perineal area;Buttocks;Right upper leg;Left upper leg;Face Body parts bathed by helper: Left lower leg;Right lower leg   Assist Level: Minimal Assistance - Patient > 75%    Upper Body Dressing(including orthotics)   What is the patient wearing?: Pull over shirt   Assist Level: Set up assist    Lower Body Dressing (excluding footwear)   What is the patient wearing?: Pants Assist for lower body dressing: Supervision/Verbal cueing    Putting on/Taking off footwear   What is the patient wearing?: Non-skid slipper socks Assist for footwear: Minimal Assistance - Patient > 75%       Care Tool Toileting Toileting activity   Assist for toileting: Minimal Assistance - Patient > 75%     Care Tool Bed Mobility Roll left and right activity        Sit to lying activity   Sit to lying assist level: Supervision/Verbal cueing    Lying to sitting on side of bed activity         Care Tool Transfers Sit to stand transfer   Sit to stand assist level: Supervision/Verbal cueing    Chair/bed transfer   Chair/bed transfer assist level: Contact Guard/Touching assist     Toilet transfer   Assist Level: Contact Guard/Touching assist     Care Tool Cognition  Expression of Ideas and Wants Expression of Ideas and Wants: 4. Without difficulty (complex and basic) - expresses complex messages without difficulty and with speech that is clear and easy to understand  Understanding Verbal and Non-Verbal Content Understanding Verbal and Non-Verbal Content: 4. Understands (complex and basic) - clear comprehension without cues or repetitions   Memory/Recall Ability Memory/Recall Ability : Current season;Location of own room;Staff names and faces;That he or she is in a hospital/hospital unit   Refer to Care Plan for [place] 1 OT Short Term Goal 1 (Week 1): STGs = LTGs  Recommendations for other services: None    Skilled Therapeutic  Intervention ADL ADL Eating: Independent Grooming: Setup Upper Body  Bathing: Setup Lower Body Bathing: Minimal assistance Upper Body Dressing: Supervision/safety Lower Body Dressing: Minimal assistance Toileting: Minimal assistance Toilet Transfer: Contact guard Toilet Transfer Method: Counselling psychologist: Emergency planning/management officer Transfer: Curator Method: Heritage manager: Transfer tub bench;Grab bars    Pt received in w/c with daugther and granddtr present. Family left. Pt eager for a shower but RN unable to disconnect her IV as she is on continuous fluids.  Pt seen for initial evaluation and ADL training with a focus on balance, R side coordination and sensory awareness.  Pt completed sponge bath, dressing and transfers to toilet and shower to prepare for tomorrow's shower.  See ADL documentation above. Pt participated well and is very motivated to return to a Mod I level of care.  Reviewed role of OT, discussed POC, pts goals, ELOS. Pt resting in bed with all needs met. Alarm set.   Discharge Criteria: Patient will be discharged from OT if patient refuses treatment 3 consecutive times without medical reason, if treatment goals not met, if there is a change in medical status, if patient makes no progress towards goals or if patient is discharged from hospital.  The above assessment, treatment plan, treatment alternatives and goals were discussed and mutually agreed upon: by patient  [place] 02/01/2023, 1:25 PM

## 2023-02-01 NOTE — Patient Care Conference (Signed)
Inpatient RehabilitationTeam Conference and Plan of Care Update Date: 02/01/2023   Time: 10:04 AM    Patient Name: [name]      Medical Record Number: [id number]  Date of Birth: Dec 19, 1955 Sex: Female         Room/Bed: 4M04C/4M04C-01 Payor Info: Payor: Theme park manager MEDICARE / Plan: [facility] MEDICARE / Product Type: *No Product type* /    Admit Date/Time:  01/31/2023  3:42 PM  Primary Diagnosis:  ICH (intracerebral hemorrhage) [facility])  Hospital Problems: Principal Problem:   ICH (intracerebral hemorrhage) ([place])    Expected Discharge Date: Expected Discharge Date:  (1.5 wk LOS)  Team Members Present: Physician leading conference: Dr. Alysia Penna Social Worker Present: Erlene Quan, BSW Nurse Present: Dorien Chihuahua, RN PT Present: Alden Hipp, PT OT Present: Meriel Pica, OT PPS Coordinator present : Gunnar Fusi, SLP     Current Status/Progress Goal Weekly Team Focus  Bowel/Bladder   Patient is continent of Bladder/Bowel. LBM 01/29/23   Maintain continence   Patient is able to notify staff of her toileting needs, assess upon request/    Swallow/Nutrition/ Hydration               ADL's   eval pending            Mobility   mild gout pain is improving; bed mobility = supervision, standing transfers = CGA, ambulation ~100 ft using RW with CGA/ minA   supervision overall  family education, NMR, standing balance,    Communication                Safety/Cognition/ Behavioral Observations               Pain   Denies Pain   < = 2   Assess pain QS and PRN    Skin   Skin intact   Maintain skin integrity  Assess skin QS/PRN address new onset with skin integrity      Discharge Planning:  evals pending   Team Discussion: Patient with little motor deficits and more sensory deficits on right side post ICH with fluctuating BP; monitoring BP with activity. Gout flare treated with prednisone and IV abx; abx discontinued, continue IVF  for now. Limited by pain in left foot.   Patient on target to meet rehab goals: yes, currently needs supervision for bed mobility but able to ambulate up to 100' using a RW with CGA.  Goals for discharge set for mod I overall.  *See Care Plan and progress notes for long and short-term goals.   Revisions to Treatment Plan:  N/A  Teaching Needs: Safety, medications, dietary modifications, transfers, toileting, etc.  Current Barriers to Discharge: Decreased caregiver support  Possible Resolutions to Barriers: Family education HH follow up services     Medical Summary Current Status: gout flare, poor fluid intake , uncontrolled diabetes  Barriers to Discharge: Morbid Obesity;Uncontrolled Diabetes;Uncontrolled Pain   Possible Resolutions to [employer] Focus: medication management for diabetes, HTN, fluid managewment, pain management LLE   Continued Need for Acute Rehabilitation Level of Care: The patient requires daily medical management by a physician with specialized training in physical medicine and rehabilitation for the following reasons: Direction of a multidisciplinary physical rehabilitation program to maximize functional independence : Yes Medical management of patient stability for increased activity during participation in an intensive rehabilitation regime.: Yes Analysis of laboratory values and/or radiology reports with any subsequent need for medication adjustment and/or medical intervention. : Yes   I attest that I was present,  lead the team conference, and concur with the assessment and plan of the team.   Margarito Liner 02/01/2023, 2:35 PM

## 2023-02-01 NOTE — Progress Notes (Signed)
Inpatient [facility] Individual Statement of Services  Patient Name:  [name]  Date:  02/01/2023  Welcome to the [facility].  Our goal is to provide you with an individualized program based on your diagnosis and situation, designed to meet your specific needs.  With this comprehensive rehabilitation program, you will be expected to participate in at least 3 hours of rehabilitation therapies Monday-Friday, with modified therapy programming on the weekends.  Your rehabilitation program will include the following services:  Physical Therapy (PT), Occupational Therapy (OT), Speech Therapy (ST), 24 hour per day rehabilitation nursing, Therapeutic Recreaction (TR), Neuropsychology, Care Coordinator, Rehabilitation Medicine, Nutrition Services, Pharmacy Services, and Other  Weekly team conferences will be held on Wednesdays to discuss your progress.  Your Inpatient Rehabilitation Care Coordinator will talk with you frequently to get your input and to update you on team discussions.  Team conferences with you and your family in attendance may also be held.  Expected length of stay:  7-11 Days  Overall anticipated outcome: 7-11 days   Depending on your progress and recovery, your program may change. Your Inpatient Rehabilitation Care Coordinator will coordinate services and will keep you informed of any changes. Your Inpatient Rehabilitation Care Coordinator's name and contact numbers are listed  below.  The following services may also be recommended but are not provided by the [name]:   [place] will be made to provide these services after discharge if needed.  Arrangements include referral to agencies that provide these services.  Your insurance has been verified to be:   [employer] Your primary doctor is:  Bernerd Limbo, MD  Pertinent information will  be shared with your doctor and your insurance company.  Inpatient Rehabilitation Care Coordinator:  Erlene Quan, [place] or [phone]  Information discussed with and copy given to patient by: Dyanne Iha, 02/01/2023, 11:13 AM

## 2023-02-01 NOTE — Progress Notes (Signed)
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, currently with RIgh tfacial numbness as well as RUE parasthesias  ROS- neg CP, SOB< N/V/D  Objective:   VAS [place] LOWER EXTREMITY VENOUS (DVT)  Result Date: 01/31/2023  Lower Venous DVT Study Patient Name:  [name]  Date of Exam:   01/31/2023 Medical Rec #: [id number]           Accession #:    [id number] Date of Birth: [birth year]          Patient Gender: F Patient Age:   67 years Exam Location:  [facility] Procedure:      VAS [place] LOWER EXTREMITY VENOUS (DVT) Referring Phys: Langley Gauss WOLFE --------------------------------------------------------------------------------  Indications: Pain.  Limitations: Body habitus and poor ultrasound/tissue interface. Comparison Study: Previous exam on 08/26/2016 at [facility] was negative for DVT Performing Technologist: Rogelia Rohrer RVT, RDMS  Examination Guidelines: A complete evaluation includes B-mode imaging, spectral Doppler, color Doppler, and power Doppler as needed of all accessible portions of each vessel. Bilateral testing is considered an integral part of a complete examination. Limited examinations for reoccurring indications may be performed as noted. The reflux portion of the exam is performed with the patient in reverse Trendelenburg.  +-----+---------------+---------+-----------+----------+--------------+ RIGHTCompressibilityPhasicitySpontaneityPropertiesThrombus Aging +-----+---------------+---------+-----------+----------+--------------+ CFV  Full           Yes      Yes                                 +-----+---------------+---------+-----------+----------+--------------+   +---------+---------------+---------+-----------+----------+--------------+ LEFT     CompressibilityPhasicitySpontaneityPropertiesThrombus Aging  +---------+---------------+---------+-----------+----------+--------------+ CFV      Full           Yes      Yes                                 +---------+---------------+---------+-----------+----------+--------------+ SFJ      Full                                                        +---------+---------------+---------+-----------+----------+--------------+ FV Prox  Full           Yes      Yes                                 +---------+---------------+---------+-----------+----------+--------------+ FV Mid   Full           Yes      Yes                                 +---------+---------------+---------+-----------+----------+--------------+ FV DistalFull           Yes      Yes                                 +---------+---------------+---------+-----------+----------+--------------+  PFV      Full                                                        +---------+---------------+---------+-----------+----------+--------------+ POP      Full           Yes      Yes                                 +---------+---------------+---------+-----------+----------+--------------+ PTV      Full                                                        +---------+---------------+---------+-----------+----------+--------------+ PERO     Full                                                        +---------+---------------+---------+-----------+----------+--------------+    Summary: RIGHT: - No evidence of common femoral vein obstruction.  LEFT: - There is no evidence of deep vein thrombosis in the lower extremity.  - No cystic structure found in the popliteal fossa.  *See table(s) above for measurements and observations. Electronically signed by Deitra Mayo MD on 01/31/2023 at 4:41:56 PM.    Final    DG Ankle Complete Left  Result Date: 01/30/2023 CLINICAL DATA:  Left anterior ankle pain for 1 day, initial encounter EXAM: LEFT ANKLE COMPLETE - 3+ VIEW  COMPARISON:  None Available. FINDINGS: No acute fracture or dislocation is noted. Mild tarsal degenerative changes are seen. Mild soft tissue swelling is noted. IMPRESSION: Mild soft tissue swelling without acute bony abnormality. Electronically Signed   By: Inez Catalina M.D.   On: 01/30/2023 10:46   Recent Labs    02/01/23 0538  WBC 11.1*  HGB 11.6*  HCT 35.2*  PLT 222   Recent Labs    01/31/23 0241 02/01/23 0538  NA 135 137  K 4.2 3.9  CL 103 110  CO2 20* 21*  GLUCOSE 195* 135*  BUN 38* 33*  CREATININE 1.98* 1.57*  CALCIUM 8.3* 8.2*    Intake/Output Summary (Last 24 hours) at 02/01/2023 0845 Last data filed at 02/01/2023 0552 Gross per 24 hour  Intake 46.58 ml  Output 550 ml  Net -503.42 ml        Physical Exam: Vital Signs Blood pressure (!) 103/48, pulse 81, temperature 98.7 F (37.1 C), temperature source Oral, resp. rate 19, height 5\' 2"  (1.575 m), weight 108.5 kg, last menstrual period 11/16/2002, SpO2 98 %.  General: No acute distress Mood and affect are appropriate Heart: Regular rate and rhythm no rubs murmurs or extra sounds Lungs: Clear to auscultation, breathing unlabored, no rales or wheezes Abdomen: Positive bowel sounds, soft nontender to palpation, nondistended Extremities: No clubbing, cyanosis, or edema Skin: No evidence of breakdown, no evidence of rash Neuro:  Eyes without evidence of nystagmus  Tone is normal without evidence of spasticity Cerebellar exam shows no evidence of  ataxia on finger nose finger or heel to shin testing No evidence of trunkal ataxia  Motor strength is 5/5 in bilateral deltoid, biceps, triceps, finger flexors and extensors, wrist flexors and extensors, hip flexors, knee flexors and extensors, ankle dorsiflexors, plantar flexors, invertors and evertors, toe flexors and extensors  Sensory exam is normal to pinprick, proprioception and light touch in the upper and lower limbs   Cranial nerves II- Visual fields are intact  to confrontation testing, no blurring of vision III- no evidence of ptosis, upward, downward and medial gaze intact IV- no vertical diplopia or head tilt V- RIght V1 and V2 sensory loss VI- no pupil abduction weakness VII- no facial droop, good lid closure VII- normal auditory acuity IX- no pharygeal weakness, gag nl X- no pharyngeal weakness, no hoarseness XI- no trap or SCM weakness XII- no glossal weakness     Assessment/Plan: 1. Functional deficits which require 3+ hours per day of interdisciplinary therapy in a comprehensive inpatient rehab setting. Physiatrist is providing close team supervision and 24 hour management of active medical problems listed below. Physiatrist and rehab team continue to assess barriers to discharge/monitor patient progress toward functional and medical goals  Care Tool:  Bathing              Bathing assist       Upper Body Dressing/Undressing Upper body dressing        Upper body assist      Lower Body Dressing/Undressing Lower body dressing            Lower body assist       Toileting Toileting    Toileting assist       Transfers Chair/bed transfer  Transfers assist           Locomotion Ambulation   Ambulation assist              Walk 10 feet activity   Assist           Walk 50 feet activity   Assist           Walk 150 feet activity   Assist           Walk 10 feet on uneven surface  activity   Assist           Wheelchair     Assist               Wheelchair 50 feet with 2 turns activity    Assist            Wheelchair 150 feet activity     Assist          Blood pressure (!) 103/48, pulse 81, temperature 98.7 F (37.1 C), temperature source Oral, resp. rate 19, height 5\' 2"  (1.575 m), weight 108.5 kg, last menstrual period 11/16/2002, SpO2 98 %.  Medical Problem List and Plan: 1. Functional deficits secondary to left thalamic ICH d/t  hypertension with additional subacute infarcts in the left fronto-parietal white matter infarcts d/t small vessel disease             -patient may shower             -ELOS/Goals: 7-11 days, mod I to supervision   2.  Antithrombotics: -DVT/anticoagulation:  Mechanical:  Antiembolism stockings, knee (TED hose) Bilateral lower extremities             -antiplatelet therapy:  Consider restarting 81 mg aspirin in 2 weeks after repeat CT head  3. Pain Management: Tylenol as needed   4. Mood/Behavior/Sleep: LCSW to evaluate and provide emotional support             -antipsychotic agents: n/a   5. Neuropsych/cognition: This patient is capable of making decisions on her own behalf.   6. Skin/Wound Care: Routine skin care checks   7. Fluids/Electrolytes/Nutrition: Routine Is and Os and follow-up chemistries   8: Hypertension: monitor TID and prn             -continue Coreg 6.25 mg BID             -continue Norvasc 10 mg daily             -bp currently borderline. May be related to prednisone Vitals:   01/31/23 1923 02/01/23 0549  BP: (!) 153/67 (!) 103/48  Pulse: 87 81  Resp: 18 19  Temp: 97.6 F (36.4 C) 98.7 F (37.1 C)  SpO2: 100% 98%    9: Hyperlipidemia: continue statin   10: Acute left forefoot gout +/- cellulitis: already appears improved today -continue colchicine -continue prednisone 40 mg daily through 3/21 -Ancef 1 mg q 8 hours through 3/23 but no evidence of cellulitis , gout is being treated will D/C   11: GERD/HH/hx of GI bleed, H. pylori: continue Protonix 40 mg daily   12: DM: CBGs q AC and q HS; home med [place] 2 mg q Saturday             -continue carb modified diet;  prednisone elevating cbg's             -per diabetic manager 3/19: change Novolog correction to 0-9 units from 0-15 and consider Novolog 2 units TID meal coverage if eats >50% meal  CBG (last 3)  Recent Labs    01/31/23 1839 01/31/23 2106 02/01/23 0628  GLUCAP 217* 217* 115*   Expect  improvement once prednisone is d/ced   13: AKI atop CKD stage II (unknown SCr baseline). History of renal cell carcinoma s/p left nephrectomy             -continue IVFs and follow-up BMP             -encourage PO liquids   14: Class 3 obesity: BMI = 41.52          LOS: 1 days A FACE TO FACE EVALUATION WAS PERFORMED  Charlett Blake 02/01/2023, 8:45 AM

## 2023-02-01 NOTE — Plan of Care (Addendum)
I have read and reviewed the attached note and am in agreement with the documentation provided.           This licensed clinician was present and actively directing care throughout the  session at all times.        Judieth Keens PT, DPT, CSRS  ------------------------------------- Problem: RH Balance Goal: LTG Patient will maintain dynamic sitting balance (PT) Description: LTG:  Patient will maintain dynamic sitting balance with assistance during mobility activities (PT) Flowsheets (Taken 02/01/2023 1751) LTG: Pt will maintain dynamic sitting balance during mobility activities with:: Independent with assistive device  Goal: LTG Patient will maintain dynamic standing balance (PT) Description: LTG:  Patient will maintain dynamic standing balance with assistance during mobility activities (PT) Flowsheets (Taken 02/01/2023 1751) LTG: Pt will maintain dynamic standing balance during mobility activities with:: Independent with assistive device    Problem: Sit to Stand Goal: LTG:  Patient will perform sit to stand with assistance level (PT) Description: LTG:  Patient will perform sit to stand with assistance level (PT) Flowsheets (Taken 02/01/2023 1751) LTG: PT will perform sit to stand in preparation for functional mobility with assistance level: Independent with assistive device   Problem: RH Bed to Chair Transfers Goal: LTG Patient will perform bed/chair transfers w/assist (PT) Description: LTG: Patient will perform bed to chair transfers with assistance (PT). Flowsheets (Taken 02/01/2023 1751) LTG: Pt will perform Bed to Chair Transfers with assistance level: Set up assist    Problem: RH Car Transfers Goal: LTG Patient will perform car transfers with assist (PT) Description: LTG: Patient will perform car transfers with assistance (PT). Flowsheets (Taken 02/01/2023 1751) LTG: Pt will perform car transfers with assist:: Set up assist    Problem: RH Ambulation Goal: LTG Patient will ambulate  in controlled environment (PT) Description: LTG: Patient will ambulate in a controlled environment, # of feet with assistance (PT). 02/01/2023 1753 by Sudie Grumbling, [place], Student-PT Flowsheets (Taken 02/01/2023 1753) LTG: Pt will ambulate in controlled environ  assist needed:: Set up assist LTG: Ambulation distance in controlled environment: 244ft Goal: LTG Patient will ambulate in community environment (PT) Description: LTG: Patient will ambulate in community environment, # of feet with assistance (PT). 02/01/2023 1753 by Sudie Grumbling, [place], Student-PT Flowsheets (Taken 02/01/2023 1753) LTG: Pt will ambulate in community environ  assist needed:: Set up assist LTG: Ambulation distance in community environment: 185ft   Problem: RH Stairs Goal: LTG Patient will ambulate up and down stairs w/assist (PT) Description: LTG: Patient will ambulate up and down # of stairs with assistance (PT) Flowsheets (Taken 02/01/2023 1751) LTG: Pt will ambulate up/down stairs assist needed:: Set up assist LTG: Pt will  ambulate up and down number of stairs: 4

## 2023-02-01 NOTE — Evaluation (Signed)
Physical Therapy Assessment and Plan  Patient Details  Name: [name] MRN: [id number] Date of Birth: [birth year]  PT Diagnosis: Abnormal posture, Abnormality of gait, Difficulty walking, Impaired sensation, Muscle weakness, and Pain in L foot due to gout  Rehab Potential: Good ELOS: 10-12 days   Today's Date: 02/01/2023 PT Individual Time: 0850-1000 PT Individual Time Calculation: 75 min     Hospital Problem: Principal Problem:   ICH (intracerebral hemorrhage) ([place])   Past Medical History:  Past Medical History:  Diagnosis Date   Anemia    Blood transfusion 2010   associated with GI bleeding   CAD (coronary artery disease)    CHF (congestive heart failure) ([place])    CKD (chronic kidney disease) stage 2, GFR 60-89 ml/min    Depression    Diverticulosis of colon with hemorrhage 2010, 09/2011   DM2 (diabetes mellitus, type 2) ([place])    Gout 01/30/2023   Left Foot   H/O hiatal hernia    Helicobacter pylori gastritis 09/13/2013   Hemorrhagic stroke ([place]) 2019   Right Basal Ganglia   Hx: UTI (urinary tract infection)    Hypertension    Intraparenchymal hemorrhage of brain ([place]) 01/27/2023   Morbid obesity with BMI of 40.0-44.9, adult ([place])    Personal history of colonic polyps    adenomas since 2010   PVD (peripheral vascular disease) ([place])    Renal cell carcinoma 2004   lt kidney removed   Stroke ([place]) 12/2017   05/2021   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   Venous stasis    Past Surgical History:  Past Surgical History:  Procedure Laterality Date   ABDOMINAL HYSTERECTOMY  2004   BIOPSY  10/23/2019   Procedure: BIOPSY;  Surgeon: Lavena Bullion, DO;  Location: [facility];  Service: Gastroenterology;;   CHOLECYSTECTOMY     COLONOSCOPY  12/19/2008   diverticulosis   COLONOSCOPY  11/17/2011   Procedure: COLONOSCOPY;  Surgeon: Zenovia Jarred, MD;  Location: [facility];  Service: Gastroenterology;  Laterality: N/A;   DILATION AND CURETTAGE OF UTERUS N/A  07/11/2014   Procedure: Repair of Vaginal Cuff;  Surgeon: Melina Schools, MD;  Location: [facility];  Service: Gynecology;  Laterality: N/A;   ESOPHAGOGASTRODUODENOSCOPY (EGD) WITH PROPOFOL N/A 10/23/2019   Procedure: ESOPHAGOGASTRODUODENOSCOPY (EGD) WITH PROPOFOL;  Surgeon: Lavena Bullion, DO;  Location: [facility];  Service: Gastroenterology;  Laterality: N/A;   FLEXIBLE SIGMOIDOSCOPY  12/12/2008   diverticulosis   HAND SURGERY     x5 right   HYDRADENITIS EXCISION Right 03/21/2014   Procedure: EXCISION HIDRADENITIS AXILLA;  Surgeon: Pedro Earls, MD;  Location: [facility];  Service: General;  Laterality: Right;   KNEE ARTHROSCOPY     x2 left   NEPHRECTOMY  2013   left   TRIGGER FINGER RELEASE Left 06/23/2015   Procedure: RELEASE TRIGGER FINGER/A-1 PULLEY LEFT RING FINGER;  Surgeon: Daryll Brod, MD;  Location: [facility];  Service: Orthopedics;  Laterality: Left;   TUBAL LIGATION  1980   UPPER GASTROINTESTINAL ENDOSCOPY  12/12/2008   gastroporesis   XI ROBOTIC ASSISTED VENTRAL HERNIA N/A 01/21/2022   Procedure: ROBOTIC INCISIONAL HERNIA REPAIR WITH MESH, BILATERAL POSTERIOR RECTUS MYOFASCIAL RELEASE;  Surgeon: Felicie Morn, MD;  Location: [facility];  Service: General;  Laterality: N/A;    Assessment & Plan Clinical Impression: Patient is a 67 y.o. year old female who presented to the ED on 01/27/2023 complaining of tingling and numbness of her right side similar to  symptoms she had from previous stroke. History of hypertension on HCTZ and lisinopril. CT head consistent with thalamic intraparenchymal hemorrhage. Neurology consulted. Systolic BP range in [id number]. Admitted for BP control and further work-up. 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 AKI with serum creatinine to 2.10. IVFs NS at 75 cc/hr continues. A1c 7.1%. Inpatient diabetic coordinator consulted. She develop acute right  foot pain and orthopedic surgery was consulted. [place] films obtained. Started on prednisone and colchicine for acute gout. Added Ancef for possible cellulitis. Light touch diminished in left arm. No antithrombotic prior to admission, now on no antithrombotic secondary to IPH. Consider restarting 81 mg aspirin in 2 weeks after repeat CT head. Now on amlodipine and carvedilol. The patient requires inpatient physical medicine and rehabilitation evaluations and treatment secondary to dysfunction due to [place] and small subacute left ischemic infarcts. .  Patient transferred to CIR on 01/31/2023 .   Patient currently requires min with mobility secondary to muscle weakness, decreased cardiorespiratoy endurance, decreased visual acuity, and decreased standing balance and decreased balance strategies.  Prior to hospitalization, patient was modified independent  with mobility and lived with Family in a [place] home.  Home access is  Level entry.  Patient will benefit from skilled PT intervention to maximize safe functional mobility, minimize fall risk, and decrease caregiver burden for planned discharge home with intermittent assist.  Anticipate patient will benefit from follow up [facility] at discharge.  PT - End of Session Activity Tolerance: Tolerates 10 - 20 min activity with multiple rests Endurance Deficit: Yes PT Assessment Rehab Potential (ACUTE/IP ONLY): Good PT Barriers to Discharge: Decreased caregiver support;IV antibiotics;Insurance for SNF coverage;Lack of/limited family support PT Plan PT Intensity: Minimum of 1-2 x/day ,45 to 90 minutes PT Frequency: 5 out of 7 days PT Duration Estimated Length of Stay: 10-12 days PT Treatment/Interventions: Ambulation/gait training;Balance/vestibular training;Cognitive remediation/compensation;Community reintegration;Discharge planning;Disease management/prevention;DME/adaptive equipment instruction;Functional electrical stimulation;Functional mobility training;Pain  management;Neuromuscular re-education;Patient/family education;Psychosocial support;Stair training;Splinting/orthotics;Therapeutic Activities;Therapeutic Exercise;UE/LE Strength taining/ROM;UE/LE Coordination activities;Visual/perceptual remediation/compensation;Wheelchair propulsion/positioning PT Recommendation Recommendations for Other Services: Therapeutic Recreation consult Therapeutic Recreation Interventions: Pet therapy;Stress management;Kitchen group;Outing/community reintergration Follow Up Recommendations: Home health PT Patient destination: Home Equipment Recommended: To be determined   PT Evaluation Precautions/Restrictions Precautions Precautions: Fall Restrictions Weight Bearing Restrictions: No General   Vital SignsTherapy Vitals Temp: 98.2 F (36.8 C) Pulse Rate: 94 Resp: 20 BP: (!) 139/90 Patient Position (if appropriate): Sitting Oxygen Therapy SpO2: 98 % Pain Pain Assessment Pain Scale: 0-10 Pain Score: 0-No pain Pain Interference Pain Interference Pain Effect on Sleep: 1. Rarely or not at all Pain Interference with Therapy Activities: 1. Rarely or not at all Pain Interference with Day-to-Day Activities: 1. Rarely or not at all Home Living/Prior [place] Available Help at Discharge: Family;Available PRN/intermittently Type of Home: Apartment Home Access: Level entry Home Layout: One level Bathroom Shower/Tub: Tub/shower unit (garden tub) Bathroom Toilet: Programmer, systems: Yes  Lives With: Family Prior Function Level of Independence: Independent with basic ADLs;Independent with homemaking with ambulation;Independent with gait;Requires assistive device for independence (w/ RW)  Able to Take Stairs?: Yes Driving: No Vocation: Retired Radiographer, therapeutic - History Ability to See in Adequate Light: 0 Adequate Vision - Assessment Eye Alignment: Within Functional Limits Ocular Range of Motion: Within Functional  Limits Tracking/Visual Pursuits: Requires cues, head turns, or add eye shifts to track Saccades: Within functional limits Convergence: Within functional limits Perception Perception: Within Functional Limits Praxis Praxis: Intact  Cognition Overall Cognitive  Status: Within Functional Limits for tasks assessed Arousal/Alertness: Awake/alert Orientation Level: Oriented X4 Safety/Judgment: Appears intact Sensation Sensation Light Touch: Impaired by gross assessment Hot/Cold: Appears Intact Proprioception: Appears Intact Coordination Gross Motor Movements are Fluid and Coordinated: No Fine Motor Movements are Fluid and Coordinated: No Coordination and Movement Description: Decreased motor coordination Motor  Motor Motor: Hemiplegia Motor - Skilled Clinical Observations: R side weakness   Trunk/Postural Assessment  Cervical Assessment Cervical Assessment: Within Functional Limits Thoracic Assessment Thoracic Assessment: Within Functional Limits Lumbar Assessment Lumbar Assessment: Within Functional Limits Postural Control Postural Control: Within Functional Limits  Balance Balance Balance Assessed: Yes Static Sitting Balance Static Sitting - Balance Support: Feet supported Static Sitting - Level of Assistance: 5: Stand by assistance Dynamic Sitting Balance Dynamic Sitting - Balance Support: Feet supported Dynamic Sitting - Level of Assistance: 5: Stand by assistance Static Standing Balance Static Standing - Balance Support: During functional activity Static Standing - Level of Assistance: 5: Stand by assistance Dynamic Standing Balance Dynamic Standing - Level of Assistance: 4: Min assist;5: Stand by assistance Extremity Assessment      RLE Assessment RLE Assessment: Exceptions to [facility] RLE Strength Right Hip Flexion: 4-/5 Right Hip ABduction: 4/5 Right Hip ADduction: 4/5 Right Knee Flexion: 4/5 Right Knee Extension: 4/5 Right Ankle Dorsiflexion: 3+/5 Right  Ankle Plantar Flexion: 4-/5 LLE Assessment LLE Assessment: Exceptions to [facility] LLE Strength Left Hip Flexion: 4/5 Left Hip ABduction: 4/5 Left Hip ADduction: 4/5 Left Knee Flexion: 4/5 Left Knee Extension: 4+/5 Left Ankle Dorsiflexion: 4-/5 Left Ankle Plantar Flexion: 4-/5  Care Tool Care Tool Bed Mobility Roll left and right activity   Roll left and right assist level: Independent with assistive device    Sit to lying activity   Sit to lying assist level: Supervision/Verbal cueing    Lying to sitting on side of bed activity   Lying to sitting on side of bed assist level: the ability to move from lying on the back to sitting on the side of the bed with no back support.: Supervision/Verbal cueing     Care Tool Transfers Sit to stand transfer   Sit to stand assist level: Supervision/Verbal cueing    Chair/bed transfer   Chair/bed transfer assist level: Contact Guard/Touching assist     Toilet transfer   Assist Level: Contact Guard/Touching assist    Car transfer   Car transfer assist level: Contact Guard/Touching assist      Care Tool Locomotion Ambulation   Assist level: Contact Guard/Touching assist Assistive device: Walker-rolling Max distance: 169ft  Walk 10 feet activity   Assist level: Contact Guard/Touching assist Assistive device: Walker-rolling   Walk 50 feet with 2 turns activity   Assist level: Contact Guard/Touching assist Assistive device: Walker-rolling  Walk 150 feet activity   Assist level: Contact Guard/Touching assist Assistive device: Walker-rolling  Walk 10 feet on uneven surfaces activity   Assist level: Contact Guard/Touching assist Assistive device: Other (comment) (HR)  Stairs   Assist level: Contact Guard/Touching assist Stairs assistive device: 2 hand rails Max number of stairs: 4  Walk up/down 1 step activity   Walk up/down 1 step (curb) assist level: Contact Guard/Touching assist Walk up/down 1 step or curb assistive device: 2 hand  rails  Walk up/down 4 steps activity   Walk up/down 4 steps assist level: Contact Guard/Touching assist Walk up/down 4 steps assistive device: 2 hand rails  Walk up/down 12 steps activity Walk up/down 12 steps activity did not occur: Safety/medical concerns  Pick up small objects from floor   Pick up small object from the floor assist level: Contact Guard/Touching assist    Wheelchair Is the patient using a wheelchair?: Yes Type of Wheelchair: Manual   Wheelchair assist level: Minimal Assistance - Patient > 75% Max wheelchair distance: 74ft  Wheel 50 feet with 2 turns activity   Assist Level: Moderate Assistance - Patient 50 - 74%  Wheel 150 feet activity   Assist Level: Total Assistance - Patient < 25%    Refer to Care Plan for Long Term Goals  SHORT TERM GOAL WEEK 1 PT Short Term Goal 1 (Week 1): Pt will perform transfer w/ CGA PT Short Term Goal 2 (Week 1): Pt will ambulate 150 ft w/ SBA PT Short Term Goal 3 (Week 1): Pt will initiate 6MWT  Recommendations for other services: Therapeutic Recreation  Pet therapy, Kitchen group, Stress management, and Outing/community reintegration  Skilled Therapeutic Intervention Mobility Bed Mobility Bed Mobility: Supine to Sit;Sit to Supine;Sitting - Scoot to [employer] of Bed;Scooting to HOB Supine to Sit: Supervision/Verbal cueing Sitting - Scoot to Edge of Bed: Supervision/Verbal cueing Sit to Supine: Supervision/Verbal cueing Scooting to HOB: Contact Guard/Touching assist Transfers Transfers: Sit to Stand;Stand to Sit;Stand Pivot Transfers Sit to Stand: Contact Guard/Touching assist Stand to Sit: Contact Guard/Touching assist Stand Pivot Transfers: Contact Guard/Touching assist Transfer (Assistive device): Rolling walker Locomotion  Gait Ambulation: Yes Gait Assistance: Contact Guard/Touching assist Gait Distance (Feet): 150 Feet Assistive device: Rolling walker Gait Gait: Yes Gait Pattern: Decreased step length -  right;Decreased step length - left;Poor foot clearance - right;Lateral trunk lean to left Gait velocity: decreased Stairs / Additional Locomotion Stairs: Yes Stairs Assistance: Contact Guard/Touching assist Stair Management Technique: Two rails Number of Stairs: 4 Height of Stairs: 6 Ramp: Contact Guard/touching assist Curb: Nurse, mental health Mobility: Yes Wheelchair Assistance: Minimal assistance - Patient >75% Wheelchair Propulsion: Both upper extremities Distance: 42ft  Pt received supine in bed w/ MD present upon entrance. Pt agreeable to PT services. Pt remained attached to IV pole throughout session w/ therapist managing IV pole.  Therapist performed new patient evaluation as noted above. Pt sat a EOB w/ supervision, using bed fixtures for assistance. At [facility] therapist donned pt's socks for time purposes. Sit<>stand using RW and CGA. No vc required. Therapist removed purewick in standing at pt's request. Pt adamant about not needing the purewick. Pt performed ~172ft of gait to ortho gym w/ CGA/Min A for RW management. Therapist's noted decreased WB through LLE, however pt denied any pain. Decreased knee flexion and decreased foot clearance bilaterally. Nurse entered gym to assess pt and administer medications.   Pt performed ~20 ft of WC mobility w/ Min A for [place] and mod vc for sequencing. Pt became fatigued stating that her RUE was tired. Pt transported dependently to main therpay gym and ascended.descended 4 stairs using BHR w/ CGA. Pt ascended leading w/ the L foot and descended leading w/ the R. Pt explained that this technique felt more functional to her. Explained that stairs scare her and that it bothers both her knees.   Pt transported back to room dependently and left in the care of Nurse to use the restroom.   Discharge Criteria: Patient will be discharged from PT if patient refuses treatment 3 consecutive times without medical  reason, if treatment goals not met, if there is a change in medical status, if patient makes no progress towards goals or if patient is discharged from hospital.  The above assessment, treatment plan, treatment alternatives and goals were discussed and mutually agreed upon: by patient  Starleen Arms 02/01/2023, 5:34 PM

## 2023-02-01 NOTE — Plan of Care (Signed)
  Problem: RH Balance Goal: LTG Patient will maintain dynamic standing with ADLs (OT) Description: LTG:  Patient will maintain dynamic standing balance with assist during activities of daily living (OT)  Flowsheets (Taken 02/01/2023 1330) LTG: Pt will maintain dynamic standing balance during ADLs with: Independent with assistive device   Problem: Sit to Stand Goal: LTG:  Patient will perform sit to stand in prep for activites of daily living with assistance level (OT) Description: LTG:  Patient will perform sit to stand in prep for activites of daily living with assistance level (OT) Flowsheets (Taken 02/01/2023 1330) LTG: PT will perform sit to stand in prep for activites of daily living with assistance level: Independent with assistive device   Problem: RH Bathing Goal: LTG Patient will bathe all body parts with assist levels (OT) Description: LTG: Patient will bathe all body parts with assist levels (OT) Flowsheets (Taken 02/01/2023 1330) LTG: Pt will perform bathing with assistance level/cueing: Independent with assistive device    Problem: RH Dressing Goal: LTG Patient will perform upper body dressing (OT) Description: LTG Patient will perform upper body dressing with assist, with/without cues (OT). Flowsheets (Taken 02/01/2023 1330) LTG: Pt will perform upper body dressing with assistance level of: Independent Goal: LTG Patient will perform lower body dressing w/assist (OT) Description: LTG: Patient will perform lower body dressing with assist, with/without cues in positioning using equipment (OT) Flowsheets (Taken 02/01/2023 1330) LTG: Pt will perform lower body dressing with assistance level of: Independent with assistive device   Problem: RH Toileting Goal: LTG Patient will perform toileting task (3/3 steps) with assistance level (OT) Description: LTG: Patient will perform toileting task (3/3 steps) with assistance level (OT)  Flowsheets (Taken 02/01/2023 1330) LTG: Pt will perform  toileting task (3/3 steps) with assistance level: Independent with assistive device   Problem: RH Functional Use of Upper Extremity Goal: LTG Patient will use RT/LT upper extremity as a (OT) Description: LTG: Patient will use right/left upper extremity as a stabilizer/gross assist/diminished/nondominant/dominant level with assist, with/without cues during functional activity (OT) Flowsheets (Taken 02/01/2023 1330) LTG: Use of upper extremity in functional activities: RUE as diminished level LTG: Pt will use upper extremity in functional activity with assistance level of: Independent   Problem: RH Simple Meal Prep Goal: LTG Patient will perform simple meal prep w/assist (OT) Description: LTG: Patient will perform simple meal prep with assistance, with/without cues (OT). Flowsheets (Taken 02/01/2023 1330) LTG: Pt will perform simple meal prep with assistance level of: Supervision/Verbal cueing   Problem: RH Light Housekeeping Goal: LTG Patient will perform light housekeeping w/assist (OT) Description: LTG: Patient will perform light housekeeping with assistance, with/without cues (OT). Flowsheets (Taken 02/01/2023 1330) LTG: Pt will perform light housekeeping with assistance level of: Supervision/Verbal cueing LTG: Pt will perform light housekeeping w/level of: Ambulate with device   Problem: RH Toilet Transfers Goal: LTG Patient will perform toilet transfers w/assist (OT) Description: LTG: Patient will perform toilet transfers with assist, with/without cues using equipment (OT) Flowsheets (Taken 02/01/2023 1330) LTG: Pt will perform toilet transfers with assistance level of: Independent with assistive device   Problem: RH Tub/Shower Transfers Goal: LTG Patient will perform tub/shower transfers w/assist (OT) Description: LTG: Patient will perform tub/shower transfers with assist, with/without cues using equipment (OT) Flowsheets (Taken 02/01/2023 1330) LTG: Pt will perform tub/shower stall  transfers with assistance level of: Supervision/Verbal cueing LTG: Pt will perform tub/shower transfers from: (garden tub) Tub/shower combination

## 2023-02-01 NOTE — Progress Notes (Signed)
Inpatient Rehabilitation  Patient information reviewed and entered into eRehab system by Benjie Ricketson M. Cruz Bong, M.A., CCC/SLP, PPS Coordinator.  Information including medical coding, functional ability and quality indicators will be reviewed and updated through discharge.    

## 2023-02-02 LAB — GLUCOSE, CAPILLARY
Glucose-Capillary: 92 mg/dL (ref 70–99)
Glucose-Capillary: 92 mg/dL (ref 70–99)
Glucose-Capillary: 107 mg/dL — ABNORMAL HIGH (ref 70–99)
Glucose-Capillary: 135 mg/dL — ABNORMAL HIGH (ref 70–99)

## 2023-02-02 MED ORDER — SODIUM CHLORIDE 0.9 % IV SOLN: Freq: Every day | INTRAVENOUS | Status: DC

## 2023-02-02 MED ORDER — INSULIN ASPART 100 UNIT/ML IJ SOLN
0.0000 [IU] | Freq: Three times a day (TID) | INTRAMUSCULAR | Status: DC
  Administered 2023-02-02 – 2023-02-08 (×5): 2 [IU] via SUBCUTANEOUS

## 2023-02-02 NOTE — Progress Notes (Signed)
Occupational Therapy Session Note  Patient Details  Name: [name] MRN: [id number] Date of Birth: 07-15-1956  Today's Date: 02/02/2023 OT Individual Time: 1431-1533 OT Individual Time Calculation (min): 62 min    Short Term Goals: Week 1:  OT Short Term Goal 1 (Week 1): STGs = LTGs  Skilled Therapeutic Interventions/Progress Updates:  Pt greeted seated in recliner with visitor present, pt agreeable to OT intervention. Session focus on BADL reeducation, functional mobility, dynamic standing balance, RUE coordination and decreasing overall caregiver burden.     Pt donned new shirt from recliner with set- up assist. Pt ambulated to toilet with RW and CGA. Pt completed 3/3 toileting tasks with CGA, continent urine void. Pt ambulated back to recliner with same level of assist. Pt donned new pants from recliner with [place] needing assist to thread pants over gripper socks on R side. Pt completed ambulatory transfer to w/c with RW and CGA, total A transport to gym with total A.   Box and Blocks Test measures unilateral gross manual dexterity. - Instructions The pt was instructed to carry one block over at a time and go as quickly as they could, making sure their fingertips crossed the partition. One minute was given to complete the task per UE. The pt was allowed a 15-second trial period prior to testing if needed. - Results The pt transferred 33 blocks with the R hand and 36 with the L hand. The total number of blocks carried from one compartment to the other in one minute is scored per hand. Higher scores on the test indicate better gross manual dexterity.  - Norms for adults females 50-75+ 50-54 R 77.7 L 74.3 55-59 R 74.7 L 73.6 60-64 R 76.1 L 73.6 65-69 R 72 L 71.3 70-74 R 68.6 L 68.3 75+ R 65.0 L 63.6  Pt completed various therapeutic activities with an emphasis on RUE [place] and proprioception. Pt completed various folding tasks with wash cloths to challenge bimanual  integration as well as RUE [place], pt completed all tasks with MIN cues for technique and supervision overall. Graded task up and has pt use weighted clothespins to clip onto sides on wash cloths to facilitate improved intrinsic strength in R hand.   Pt able to complete seated RUE [place] with pt using compliant leggos to recreate structure shown in visual aid with an emphasis on bimanual control and [place], pt completed task with supervision with + time and effort. Pt also able to use smaller wooden blocks to create structures from visual aid provided, pt with increased difficulty with this task but still able to complete with increased time and supervision. Mild proprioception impairment noted when stacking small blocks, education provided on stabilizing R elbow on table to provide more support.    Pt even able to work on pincer grasp with pt using thumb tacks to pin into cork board to simulate more of a precision grasp needed for med mgmt.                  Ended session with pt seated in recliner with all needs within reach and chair alarm activated.                    Therapy Documentation Precautions:  Precautions Precautions: Fall Restrictions Weight Bearing Restrictions: No  Pain: no pain     Therapy/Group: Individual Therapy  Precious Haws 02/02/2023, 3:37 PM

## 2023-02-02 NOTE — Progress Notes (Signed)
PROGRESS NOTE   Subjective/Complaints:  Prior  CVA affected Left side onset ~76yr ago has residual sensory findings  Dropping objects with Righ tside No left foot pain even in PT   ROS- neg CP, SOB,N/V/D  Objective:   VAS [place] LOWER EXTREMITY VENOUS (DVT)  Result Date: 01/31/2023  Lower Venous DVT Study Patient Name:  [name]  Date of Exam:   01/31/2023 Medical Rec #: [id number]           Accession #:    [id number] Date of Birth: [birth year]          Patient Gender: F Patient Age:   67 years Exam Location:  [facility] Procedure:      VAS [place] LOWER EXTREMITY VENOUS (DVT) Referring Phys: Langley Gauss WOLFE --------------------------------------------------------------------------------  Indications: Pain.  Limitations: Body habitus and poor ultrasound/tissue interface. Comparison Study: Previous exam on 08/26/2016 at [place] was negative for DVT Performing Technologist: Rogelia Rohrer RVT, RDMS  Examination Guidelines: A complete evaluation includes B-mode imaging, spectral Doppler, color Doppler, and power Doppler as needed of all accessible portions of each vessel. Bilateral testing is considered an integral part of a complete examination. Limited examinations for reoccurring indications may be performed as noted. The reflux portion of the exam is performed with the patient in reverse Trendelenburg.  +-----+---------------+---------+-----------+----------+--------------+ RIGHTCompressibilityPhasicitySpontaneityPropertiesThrombus Aging +-----+---------------+---------+-----------+----------+--------------+ CFV  Full           Yes      Yes                                 +-----+---------------+---------+-----------+----------+--------------+   +---------+---------------+---------+-----------+----------+--------------+ LEFT     CompressibilityPhasicitySpontaneityPropertiesThrombus Aging  +---------+---------------+---------+-----------+----------+--------------+ CFV      Full           Yes      Yes                                 +---------+---------------+---------+-----------+----------+--------------+ SFJ      Full                                                        +---------+---------------+---------+-----------+----------+--------------+ FV Prox  Full           Yes      Yes                                 +---------+---------------+---------+-----------+----------+--------------+ FV Mid   Full           Yes      Yes                                 +---------+---------------+---------+-----------+----------+--------------+ FV DistalFull           Yes      Yes                                 +---------+---------------+---------+-----------+----------+--------------+  PFV      Full                                                        +---------+---------------+---------+-----------+----------+--------------+ POP      Full           Yes      Yes                                 +---------+---------------+---------+-----------+----------+--------------+ PTV      Full                                                        +---------+---------------+---------+-----------+----------+--------------+ PERO     Full                                                        +---------+---------------+---------+-----------+----------+--------------+    Summary: RIGHT: - No evidence of common femoral vein obstruction.  LEFT: - There is no evidence of deep vein thrombosis in the lower extremity.  - No cystic structure found in the popliteal fossa.  *See table(s) above for measurements and observations. Electronically signed by Deitra Mayo MD on 01/31/2023 at 4:41:56 PM.    Final    Recent Labs    02/01/23 0538  WBC 11.1*  HGB 11.6*  HCT 35.2*  PLT 222    Recent Labs    01/31/23 0241 02/01/23 0538  NA 135 137  K 4.2 3.9  CL  103 110  CO2 20* 21*  GLUCOSE 195* 135*  BUN 38* 33*  CREATININE 1.98* 1.57*  CALCIUM 8.3* 8.2*     Intake/Output Summary (Last 24 hours) at 02/02/2023 0805 Last data filed at 02/01/2023 0906 Gross per 24 hour  Intake 236 ml  Output --  Net 236 ml         Physical Exam: Vital Signs Blood pressure (!) 145/76, pulse 86, temperature 98.7 F (37.1 C), temperature source Oral, resp. rate 18, height 5\' 2"  (1.575 m), weight 108.5 kg, last menstrual period 11/16/2002, SpO2 94 %.  General: No acute distress Mood and affect are appropriate Heart: Regular rate and rhythm no rubs murmurs or extra sounds Lungs: Clear to auscultation, breathing unlabored, no rales or wheezes Abdomen: Positive bowel sounds, soft nontender to palpation, nondistended Extremities: No clubbing, cyanosis, or edema Skin: No evidence of breakdown, no evidence of rash Neuro:  Eyes without evidence of nystagmus  Tone is normal without evidence of spasticity Cerebellar exam shows no evidence of ataxia on finger nose finger or heel to shin testing No evidence of trunkal ataxia  Motor strength is 5/5 in bilateral deltoid, biceps, triceps, finger flexors and extensors, wrist flexors and extensors, hip flexors, knee flexors and extensors, ankle dorsiflexors, plantar flexors, invertors and evertors, toe flexors and extensors  Sensory exam is normal to pinprick, proprioception and light touch in the upper and lower limbs   Cranial nerves II- Visual fields  are intact to confrontation testing, no blurring of vision III- no evidence of ptosis, upward, downward and medial gaze intact IV- no vertical diplopia or head tilt V- RIght V1 and V2 sensory loss VI- no pupil abduction weakness VII- no facial droop, good lid closure VII- normal auditory acuity IX- no pharygeal weakness, gag nl X- no pharyngeal weakness, no hoarseness XI- no trap or SCM weakness XII- no glossal weakness     Assessment/Plan: 1. Functional  deficits which require 3+ hours per day of interdisciplinary therapy in a comprehensive inpatient rehab setting. Physiatrist is providing close team supervision and 24 hour management of active medical problems listed below. Physiatrist and rehab team continue to assess barriers to discharge/monitor patient progress toward functional and medical goals  Care Tool:  Bathing    Body parts bathed by patient: Right arm, Left arm, Chest, Abdomen, Front perineal area, Buttocks, Right upper leg, Left upper leg, Face   Body parts bathed by helper: Left lower leg, Right lower leg     Bathing assist Assist Level: Minimal Assistance - Patient > 75%     Upper Body Dressing/Undressing Upper body dressing   What is the patient wearing?: Pull over shirt    Upper body assist Assist Level: Set up assist    Lower Body Dressing/Undressing Lower body dressing      What is the patient wearing?: Pants     Lower body assist Assist for lower body dressing: Supervision/Verbal cueing     Toileting Toileting    Toileting assist Assist for toileting: Minimal Assistance - Patient > 75%     Transfers Chair/bed transfer  Transfers assist     Chair/bed transfer assist level: Contact Guard/Touching assist     Locomotion Ambulation   Ambulation assist      Assist level: Contact Guard/Touching assist Assistive device: Walker-rolling Max distance: 151ft   Walk 10 feet activity   Assist     Assist level: Contact Guard/Touching assist Assistive device: Walker-rolling   Walk 50 feet activity   Assist    Assist level: Contact Guard/Touching assist Assistive device: Walker-rolling    Walk 150 feet activity   Assist    Assist level: Contact Guard/Touching assist Assistive device: Walker-rolling    Walk 10 feet on uneven surface  activity   Assist     Assist level: Contact Guard/Touching assist Assistive device: Other (comment) (HR)   Wheelchair     Assist Is  the patient using a wheelchair?: Yes Type of Wheelchair: Manual    Wheelchair assist level: Minimal Assistance - Patient > 75% Max wheelchair distance: 44ft    Wheelchair 50 feet with 2 turns activity    Assist        Assist Level: Moderate Assistance - Patient 50 - 74%   Wheelchair 150 feet activity     Assist      Assist Level: Total Assistance - Patient < 25%   Blood pressure (!) 145/76, pulse 86, temperature 98.7 F (37.1 C), temperature source Oral, resp. rate 18, height 5\' 2"  (1.575 m), weight 108.5 kg, last menstrual period 11/16/2002, SpO2 94 %.  Medical Problem List and Plan: 1. Functional deficits secondary to left thalamic ICH d/t hypertension with additional subacute infarcts in the left fronto-parietal white matter infarcts d/t small vessel disease             -patient may shower             -ELOS/Goals: 7-11 days, mod I to supervision   2.  Antithrombotics: -DVT/anticoagulation:  Mechanical:  Antiembolism stockings, knee (TED hose) Bilateral lower extremities             -antiplatelet therapy:  Consider restarting 81 mg aspirin in 2 weeks after repeat CT head   3. Pain Management: Tylenol as needed   4. Mood/Behavior/Sleep: LCSW to evaluate and provide emotional support             -antipsychotic agents: n/a   5. Neuropsych/cognition: This patient is capable of making decisions on her own behalf.   6. Skin/Wound Care: Routine skin care checks   7. Fluids/Electrolytes/Nutrition: Routine Is and Os and follow-up chemistries   8: Hypertension: monitor TID and prn             -continue Coreg 6.25 mg BID             -continue Norvasc 10 mg daily             -bp currently borderline. May be related to prednisone Vitals:   02/01/23 1939 02/02/23 0415  BP: (!) 145/88 (!) 145/76  Pulse: 82 86  Resp: 20 18  Temp: 97.7 F (36.5 C) 98.7 F (37.1 C)  SpO2: 96% 94%    9: Hyperlipidemia: continue statin   10: Acute left forefoot gout +/- cellulitis:  already appears improved today -continue colchicine -continue prednisone 40 mg daily through 3/21 -Ancef 1 mg q 8 hours through 3/23 but no evidence of cellulitis , gout is being treated will D/C   11: GERD/HH/hx of GI bleed, H. pylori: continue Protonix 40 mg daily   12: DM: CBGs q AC and q HS; home med [place] 2 mg q Saturday             -continue carb modified diet;  prednisone elevating cbg's             -per diabetic manager 3/19: change Novolog correction to 0-9 units from 0-15 and consider Novolog 2 units TID meal coverage if eats >50% meal  CBG (last 3)  Recent Labs    02/01/23 1625 02/01/23 2157 02/02/23 0629  GLUCAP 213* 186* 92    Expect improvement once prednisone is d/ced   13: AKI atop CKD stage II (1.3-1.5 SCr baseline). History of renal cell carcinoma s/p left nephrectomy             -continue IVFs and follow-up BMP             -encourage PO liquids- recorded intake only 240ml yesterday - cont IVF at noc     Latest Ref Rng & Units 02/01/2023    5:38 AM 01/31/2023    2:41 AM 01/30/2023    6:58 AM  BMP  Glucose 70 - 99 mg/dL 135  195  105   BUN 8 - 23 mg/dL 33  38  32   Creatinine 0.44 - 1.00 mg/dL 1.57  1.98  1.78   Sodium 135 - 145 mmol/L 137  135  135   Potassium 3.5 - 5.1 mmol/L 3.9  4.2  4.0   Chloride 98 - 111 mmol/L 110  103  104   CO2 22 - 32 mmol/L 21  20  24    Calcium 8.9 - 10.3 mg/dL 8.2  8.3  8.4       14: Class 3 obesity: BMI = 41.52          LOS: 2 days A FACE TO FACE EVALUATION WAS PERFORMED  Luanna Salk Shaindy Reader 02/02/2023, 8:05 AM

## 2023-02-02 NOTE — Progress Notes (Signed)
Occupational Therapy Session Note  Patient Details  Name: [name] MRN: [id number] Date of Birth: 07/20/1956  Today's Date: 02/02/2023 OT Individual Time: 0945-1100 OT Individual Time Calculation (min): 75 min    Short Term Goals: Week 1:  OT Short Term Goal 1 (Week 1): STGs = LTGs  Skilled Therapeutic Interventions/Progress Updates:    Pt received in recliner ready for a shower.  Pt ambulated with RW with close S to tub bench in walk in shower.  Sat down to doff clothing, needing min A to doff R sock as seat height was higher.  To wash feet, pt states at home she props one foot up on tub wall but today advised against that until she gains more balance.  Assisted pt with washing feet as she did not have as much room to lean forward or rotate on the bench.  Pt showered the rest of her body with supervision even in standing.  Pt dried off and ambulated to recliner to don lotion and dress. Pt able to don all clothing with supervision, except for R sock. Pt stated she was struggling due to decreased sensation in R foot and hand.  Pt then ambulated with RW to wc in hallway. Pt taken to gym to work on R side coordination and strength. Provided with blue foam block to use in room for finger and thumb strength. Pt used 2# dowel bar in both hands and then R hand only with slow controlled exercises and then she practiced moving a 1 lb ball from hand to hand engaging wrist and fingers.  Pt educated that engaging her muscles can help to facilitate her sensory awareness of her arm. Pt participated well. Returned to room and then used RW from door of room to [employer]. Pt resting in recliner with all needs met and alarm set.   Therapy Documentation Precautions:  Precautions Precautions: Fall Restrictions Weight Bearing Restrictions: No  Pain: no c/o pain    ADL: ADL Eating: Independent Grooming: Setup Upper Body Bathing: Setup Lower Body Bathing: Minimal assistance Upper Body Dressing:  Supervision/safety Lower Body Dressing: Minimal assistance Toileting: Minimal assistance Toilet Transfer: Contact guard Toilet Transfer Method: Counselling psychologist: Emergency planning/management officer Transfer: Curator Method: Heritage manager: Radio broadcast assistant, Grab bars   Therapy/Group: Individual Therapy  Irene Mitcham 02/02/2023, 8:28 AM

## 2023-02-02 NOTE — Progress Notes (Signed)
Physical Therapy Session Note  Patient Details  Name: [name] MRN: [id number] Date of Birth: Nov 15, 1955  Today's Date: 02/02/2023 PT Individual Time: 1300-1415 PT Individual Time Calculation (min): 75 min   Short Term Goals: Week 1:  PT Short Term Goal 1 (Week 1): Pt will perform transfer w/ CGA PT Short Term Goal 2 (Week 1): Pt will ambulate 150 ft w/ SBA PT Short Term Goal 3 (Week 1): Pt will initiate 6MWT  Skilled Therapeutic Interventions/Progress Updates: Pt presents sitting in recliner and reluctantly agreeable to therapy.  Pt transfers sit to stand w/ supervision.  Pt amb multiple trials of 150' w/ RW and CGA.  Pt tends to take hand off RW and then veers to side, or amb w/ ER LLE and kicks RW leg.  Pt able to correct w/ cueing.  Pt performed standing reaching and hooking horseshoes over hoop w/ reaching to alternating sides on table or stool to retrieve horseshoes.  Pt w/o LOB.  Pt performed horseshoes over hoop w/ step forward w/ R LE and then back.  Pt performed standing bouncing ball.  Pt performed bouncing 1 KG ball on trampoline 3 x 15.  Pt amb back to room w/ RW and CGA.  Pt remained sitting in recliner w/ all needs in reach.     Therapy Documentation Precautions:  Precautions Precautions: Fall Restrictions Weight Bearing Restrictions: No General:   Vital Signs:  Pain:0/10      Therapy/Group: Individual Therapy  Ladoris Gene 02/02/2023, 2:15 PM

## 2023-02-02 NOTE — Progress Notes (Signed)
Inpatient Rehabilitation Care Coordinator Assessment and Plan Patient Details  Name: [name] MRN: [id number] Date of Birth: 1956/09/20  Today's Date: 02/02/2023  Hospital Problems: Principal Problem:   ICH (intracerebral hemorrhage) ([place])  Past Medical History:  Past Medical History:  Diagnosis Date   Anemia    Blood transfusion 2010   associated with GI bleeding   CAD (coronary artery disease)    CHF (congestive heart failure) (H[facility])    CKD (chronic kidney disease) stage 2, GFR 60-89 ml/min    Depression    Diverticulosis of colon with hemorrhage 2010, 09/2011   DM2 (diabetes mellitus, type 2) ([place])    Gout 01/30/2023   Left Foot   H/O hiatal hernia    Helicobacter pylori gastritis 09/13/2013   Hemorrhagic stroke ([place]) 2019   Right Basal Ganglia   Hx: UTI (urinary tract infection)    Hypertension    Intraparenchymal hemorrhage of brain ([place]) 01/27/2023   Morbid obesity with BMI of 40.0-44.9, adult ([place])    Personal history of colonic polyps    adenomas since 2010   PVD (peripheral vascular disease) ([place])    Renal cell carcinoma 2004   lt kidney removed   Stroke ([place]) 12/2017   05/2021   Thalamic stroke ([place]) 05/2021   Right   Venous stasis    Past Surgical History:  Past Surgical History:  Procedure Laterality Date   ABDOMINAL HYSTERECTOMY  2004   BIOPSY  10/23/2019   Procedure: BIOPSY;  Surgeon: Lavena Bullion, DO;  Location: [facility];  Service: Gastroenterology;;   CHOLECYSTECTOMY     COLONOSCOPY  12/19/2008   diverticulosis   COLONOSCOPY  11/17/2011   Procedure: COLONOSCOPY;  Surgeon: Zenovia Jarred, MD;  Location: [facility];  Service: Gastroenterology;  Laterality: N/A;   DILATION AND CURETTAGE OF UTERUS N/A 07/11/2014   Procedure: Repair of Vaginal Cuff;  Surgeon: Melina Schools, MD;  Location: [facility];  Service: Gynecology;  Laterality: N/A;   ESOPHAGOGASTRODUODENOSCOPY (EGD) WITH PROPOFOL N/A 10/23/2019   Procedure:  ESOPHAGOGASTRODUODENOSCOPY (EGD) WITH PROPOFOL;  Surgeon: Lavena Bullion, DO;  Location: [facility];  Service: Gastroenterology;  Laterality: N/A;   FLEXIBLE SIGMOIDOSCOPY  12/12/2008   diverticulosis   HAND SURGERY     x5 right   HYDRADENITIS EXCISION Right 03/21/2014   Procedure: EXCISION HIDRADENITIS AXILLA;  Surgeon: Pedro Earls, MD;  Location: [facility];  Service: General;  Laterality: Right;   KNEE ARTHROSCOPY     x2 left   NEPHRECTOMY  2013   left   TRIGGER FINGER RELEASE Left 06/23/2015   Procedure: RELEASE TRIGGER FINGER/A-1 PULLEY LEFT RING FINGER;  Surgeon: Daryll Brod, MD;  Location: [facility];  Service: Orthopedics;  Laterality: Left;   TUBAL LIGATION  1980   UPPER GASTROINTESTINAL ENDOSCOPY  12/12/2008   gastroporesis   XI ROBOTIC ASSISTED VENTRAL HERNIA N/A 01/21/2022   Procedure: ROBOTIC INCISIONAL HERNIA REPAIR WITH MESH, BILATERAL POSTERIOR RECTUS MYOFASCIAL RELEASE;  Surgeon: Felicie Morn, MD;  Location: [facility];  Service: General;  Laterality: N/A;   Social History:  reports that she has never smoked. She has never used smokeless tobacco. She reports that she does not drink alcohol and does not use drugs.  Family / Support Systems Marital Status: Divorced How Long?: N/A Spouse/Significant Other: N/A Children: daughter Other Supports: grandson and granddaugher Anticipated Caregiver: Grandson Ability/Limitations of Caregiver: Yolanda Bonine works during the day (2nd shift) and home at nights. 2-11PM Caregiver Availability: Evenings only Family Dynamics: Support from  grandchildren and children  Social History Preferred language: English Religion: Seventh Day Adventist Cultural Background: Patient pleasant. Family relationship and dynamics poor. Support from grandson Education: [place] - How often do you need to have someone help you when you read instructions, pamphlets, or other written material from your  doctor or pharmacy?: Never Writes: Yes Employment Status: Disabled Date Retired/Disabled/Unemployed: N/A Public relations account executive Issues: N/A Guardian/Conservator: Field seismologist ([place])   Abuse/Neglect Abuse/Neglect Assessment Can Be Completed: Yes Physical Abuse: Denies Verbal Abuse: Denies Sexual Abuse: Denies Exploitation of patient/patient's resources: Denies Self-Neglect: Denies  Patient response to: Social Isolation - How often do you feel lonely or isolated from those around you?: Never  Emotional Status Pt's affect, behavior and adjustment status: Tearful Recent Psychosocial Issues: coping Psychiatric History: N/A Substance Abuse History: N/A  Patient / Family Perceptions, Expectations & Goals Pt/Family understanding of illness & functional limitations: yes Premorbid pt/family roles/activities: Independent overall Anticipated changes in roles/activities/participation: Patient plans to remain independent due to her grandsons work schuedle Pt/family expectations/goals: [place]: None Premorbid Home Care/DME Agencies: None Transportation available at discharge: [place] or other 2 children Is the patient able to respond to transportation needs?: Yes In the past 12 months, has lack of transportation kept you from medical appointments or from getting medications?: No In the past 12 months, has lack of transportation kept you from meetings, work, or from getting things needed for daily living?: No Resource referrals recommended: Neuropsychology  Discharge Planning Living Arrangements: Other relatives Support Systems: Other relatives Type of Residence: Private residence (1 level home, no steps to enter (Level entry)) Insurance Resources: Multimedia programmer (specify) Sports administrator MEDICARE) Museum/gallery curator Resources: SSD Financial Screen Referred: No Living Expenses: Education officer, community Management: Patient, Family Does the patient have any problems  obtaining your medications?: No Home Management: Independent Patient/Family Preliminary Plans: Patient plans to management cognitive tasks Care Coordinator Barriers to Discharge: Insurance for SNF coverage, Decreased caregiver support, Lack of/limited family support Care Coordinator Anticipated Follow Up Needs: HH/OP Expected length of stay: 7-11 Days  Clinical Impression SW met with patient with patient, introduced, explained role and addressed questions and concerns.Patient plans to discharge back home with her grandson who works 2-11 PM. Supervision only available in the evening. Patient lives in a level entry home and anticipates. Patient has a rolling walker that she used in the past. No additional questions.   Dyanne Iha 02/02/2023, 12:11 PM

## 2023-02-03 LAB — GLUCOSE, CAPILLARY
Glucose-Capillary: 91 mg/dL (ref 70–99)
Glucose-Capillary: 97 mg/dL (ref 70–99)
Glucose-Capillary: 87 mg/dL (ref 70–99)
Glucose-Capillary: 137 mg/dL — ABNORMAL HIGH (ref 70–99)

## 2023-02-03 MED ORDER — LORATADINE 10 MG PO TABS
10.0000 mg | ORAL_TABLET | Freq: Every day | ORAL | Status: DC | PRN
  Administered 2023-02-03: 10 mg via ORAL
  Filled 2023-02-03 (×2): qty 1

## 2023-02-03 NOTE — Discharge Instructions (Addendum)
Inpatient Rehab Discharge Instructions  [name] Discharge date and time:  02/10/2023  Activities/Precautions/ Functional Status: Activity: no lifting, driving, or strenuous exercise until cleared by MD Diet: diabetic diet Wound Care: none needed Functional status:  ___ No restrictions     ___ Walk up steps independently ___ 24/7 supervision/assistance   ___ Walk up steps with assistance __x_ Intermittent supervision/assistance  ___ Bathe/dress independently ___ Walk with walker     ___ Bathe/dress with assistance ___ Walk Independently    ___ Shower independently ___ Walk with assistance    __x_ Shower with assistance _x__ No alcohol     ___ Return to work/school ________  COMMUNITY REFERRALS UPON DISCHARGE:    Home Health:   PT     OT                        Agency: [facility] Phone: [phone] SOC: 02/13/2023     Special Instructions: No driving, alcohol consumption or tobacco use.             STROKE/TIA DISCHARGE INSTRUCTIONS SMOKING Cigarette smoking nearly doubles your risk of having a stroke & is the single most alterable risk factor  If you smoke or have smoked in the last 12 months, you are advised to quit smoking for your health. Most of the excess cardiovascular risk related to smoking disappears within a year of stopping. Ask you doctor about anti-smoking medications [place] Quit Line: 1-800-QUIT NOW Free Smoking Cessation Classes [phone]  CHOLESTEROL Know your levels; limit fat & cholesterol in your diet  Lipid Panel     Component Value Date/Time   CHOL 242 (H) 01/29/2023 0323   TRIG 115 01/29/2023 0323   HDL 48 01/29/2023 0323   CHOLHDL 5.0 01/29/2023 0323   VLDL 23 01/29/2023 0323   LDLCALC 171 (H) 01/29/2023 0323     Many patients benefit from treatment even if their cholesterol is at goal. Goal: Total Cholesterol (CHOL) less than 160 Goal:  Triglycerides (TRIG) less than 150 Goal:  HDL greater than 40 Goal:  LDL (LDLCALC) less than  100   BLOOD PRESSURE American Stroke Association blood pressure target is less that 120/80 mm/Hg  Your discharge blood pressure is:  BP: (!) 144/69 Monitor your blood pressure Limit your salt and alcohol intake Many individuals will require more than one medication for high blood pressure  DIABETES (A1c is a blood sugar average for last 3 months) Goal HGBA1c is under 7% (HBGA1c is blood sugar average for last 3 months)  Diabetes: Diagnosis of diabetes:  Your A1c:7.1 %    Lab Results  Component Value Date   HGBA1C 7.1 (H) 01/28/2023    Your HGBA1c can be lowered with medications, healthy diet, and exercise. Check your blood sugar as directed by your physician Call your physician if you experience unexplained or low blood sugars.  PHYSICAL ACTIVITY/REHABILITATION Goal is 30 minutes at least 4 days per week  Activity: Increase activity slowly, Therapies: Physical Therapy: Home Health Return to work: when cleared by MD Activity decreases your risk of heart attack and stroke and makes your heart stronger.  It helps control your weight and blood pressure; helps you relax and can improve your mood. Participate in a regular exercise program. Talk with your doctor about the best form of exercise for you (dancing, walking, swimming, cycling).  DIET/WEIGHT Goal is to maintain a healthy weight  Your discharge diet is:  Diet Order  Diet heart healthy/carb modified Room service appropriate? Yes; Fluid consistency: Thin  Diet effective now                  thin liquids Your height is:  Height: 5\' 2"  (157.5 cm) Your current weight is: Weight: 108.5 kg Your Body Mass Index (BMI) is:  BMI (Calculated): 43.74 Following the type of diet specifically designed for you will help prevent another stroke. Your goal weight range is:   Your goal Body Mass Index (BMI) is 19-24. Healthy food habits can help reduce 3 risk factors for stroke:  High cholesterol, hypertension, and excess weight.   RESOURCES Stroke/Support Group:  Call [phone]   STROKE EDUCATION PROVIDED/REVIEWED AND GIVEN TO PATIENT Stroke warning signs and symptoms How to activate emergency medical system (call 911). Medications prescribed at discharge. Need for follow-up after discharge. Personal risk factors for stroke. Pneumonia vaccine given: No Flu vaccine given: No My questions have been answered, the writing is legible, and I understand these instructions.  I will adhere to these goals & educational materials that have been provided to me after my discharge from the hospital.     My questions have been answered and I understand these instructions. I will adhere to these goals and the provided educational materials after my discharge from the hospital.  Patient/Caregiver Signature _______________________________ Date __________  Clinician Signature _______________________________________ Date __________  Please bring this form and your medication list with you to all your follow-up doctor's appointments.

## 2023-02-03 NOTE — Progress Notes (Signed)
PROGRESS NOTE   Subjective/Complaints:  No issues overnite except voiding a lot , no burning with urination , still getting IVF at noc   ROS- neg CP, SOB,N/V/D  Objective:   No results found. Recent Labs    02/01/23 0538  WBC 11.1*  HGB 11.6*  HCT 35.2*  PLT 222    Recent Labs    02/01/23 0538  NA 137  K 3.9  CL 110  CO2 21*  GLUCOSE 135*  BUN 33*  CREATININE 1.57*  CALCIUM 8.2*     Intake/Output Summary (Last 24 hours) at 02/03/2023 0747 Last data filed at 02/02/2023 1326 Gross per 24 hour  Intake 188 ml  Output --  Net 188 ml         Physical Exam: Vital Signs Blood pressure (!) 160/70, pulse 84, temperature 98.2 F (36.8 C), temperature source Oral, resp. rate 16, height 5\' 2"  (1.575 m), weight 108.5 kg, last menstrual period 11/16/2002, SpO2 97 %.  General: No acute distress Mood and affect are appropriate Heart: Regular rate and rhythm no rubs murmurs or extra sounds Lungs: Clear to auscultation, breathing unlabored, no rales or wheezes Abdomen: Positive bowel sounds, soft nontender to palpation, nondistended Extremities: No clubbing, cyanosis, or edema Skin: No evidence of breakdown, no evidence of rash Neuro:  Eyes without evidence of nystagmus  Tone is normal without evidence of spasticity Cerebellar exam shows no evidence of ataxia on finger nose finger or heel to shin testing No evidence of trunkal ataxia  Motor strength is 5/5 in bilateral deltoid, biceps, triceps, finger flexors and extensors, wrist flexors and extensors, hip flexors, knee flexors and extensors, ankle dorsiflexors, plantar flexors, invertors and evertors, toe flexors and extensors  Sensory exam is normal to pinprick, proprioception and light touch in the upper and lower limbs   Cranial nerves II- Visual fields are intact to confrontation testing, no blurring of vision III- no evidence of ptosis, upward, downward  and medial gaze intact IV- no vertical diplopia or head tilt V- RIght V1 and V2 sensory loss VI- no pupil abduction weakness VII- no facial droop, good lid closure VII- normal auditory acuity IX- no pharygeal weakness, gag nl X- no pharyngeal weakness, no hoarseness XI- no trap or SCM weakness XII- no glossal weakness     Assessment/Plan: 1. Functional deficits which require 3+ hours per day of interdisciplinary therapy in a comprehensive inpatient rehab setting. Physiatrist is providing close team supervision and 24 hour management of active medical problems listed below. Physiatrist and rehab team continue to assess barriers to discharge/monitor patient progress toward functional and medical goals  Care Tool:  Bathing    Body parts bathed by patient: Right arm, Left arm, Chest, Abdomen, Front perineal area, Buttocks, Right upper leg, Left upper leg, Face   Body parts bathed by helper: Left lower leg, Right lower leg     Bathing assist Assist Level: Minimal Assistance - Patient > 75%     Upper Body Dressing/Undressing Upper body dressing   What is the patient wearing?: Pull over shirt    Upper body assist Assist Level: Set up assist    Lower Body Dressing/Undressing Lower body dressing  What is the patient wearing?: Pants     Lower body assist Assist for lower body dressing: Minimal Assistance - Patient > 75%     Toileting Toileting    Toileting assist Assist for toileting: Contact Guard/Touching assist     Transfers Chair/bed transfer  Transfers assist     Chair/bed transfer assist level: Contact Guard/Touching assist     Locomotion Ambulation   Ambulation assist      Assist level: Contact Guard/Touching assist Assistive device: Walker-rolling Max distance: 196ft+   Walk 10 feet activity   Assist     Assist level: Contact Guard/Touching assist Assistive device: Walker-rolling   Walk 50 feet activity   Assist    Assist level:  Contact Guard/Touching assist Assistive device: Walker-rolling    Walk 150 feet activity   Assist    Assist level: Contact Guard/Touching assist Assistive device: Walker-rolling    Walk 10 feet on uneven surface  activity   Assist     Assist level: Contact Guard/Touching assist Assistive device: Other (comment) (HR)   Wheelchair     Assist Is the patient using a wheelchair?: Yes Type of Wheelchair: Manual    Wheelchair assist level: Minimal Assistance - Patient > 75% Max wheelchair distance: 67ft    Wheelchair 50 feet with 2 turns activity    Assist        Assist Level: Moderate Assistance - Patient 50 - 74%   Wheelchair 150 feet activity     Assist      Assist Level: Total Assistance - Patient < 25%   Blood pressure (!) 160/70, pulse 84, temperature 98.2 F (36.8 C), temperature source Oral, resp. rate 16, height 5\' 2"  (1.575 m), weight 108.5 kg, last menstrual period 11/16/2002, SpO2 97 %.  Medical Problem List and Plan: 1. Functional deficits secondary to left thalamic ICH d/t hypertension with additional subacute infarcts in the left fronto-parietal white matter infarcts d/t small vessel disease             -patient may shower             -ELOS/Goals: 7-11 days, mod I to supervision   2.  Antithrombotics: -DVT/anticoagulation:  Mechanical:  Antiembolism stockings, knee (TED hose) Bilateral lower extremities             -antiplatelet therapy:  Consider restarting 81 mg aspirin in 2 weeks after repeat CT head   3. Pain Management: Tylenol as needed   4. Mood/Behavior/Sleep: LCSW to evaluate and provide emotional support             -antipsychotic agents: n/a   5. Neuropsych/cognition: This patient is capable of making decisions on her own behalf.   6. Skin/Wound Care: Routine skin care checks   7. Fluids/Electrolytes/Nutrition: Routine Is and Os and follow-up chemistries   8: Hypertension: monitor TID and prn             -continue  Coreg 6.25 mg BID             -continue Norvasc 10 mg daily             -bp currently borderline. May be related to prednisone Vitals:   02/02/23 2020 02/03/23 0351  BP: (!) 129/93 (!) 160/70  Pulse: 79 84  Resp: 16 16  Temp: (!) 97.5 F (36.4 C) 98.2 F (36.8 C)  SpO2: 99% 97%    9: Hyperlipidemia: continue statin   10: Acute left forefoot gout +/- cellulitis: already appears improved today -continue  colchicine -continue prednisone 40 mg daily through 3/21 -Ancef 1 mg q 8 hours through 3/23 but no evidence of cellulitis , gout is being treated will D/C   11: GERD/HH/hx of GI bleed, H. pylori: continue Protonix 40 mg daily   12: DM: CBGs q AC and q HS; home med [place] 2 mg q Saturday             -continue carb modified diet;  prednisone elevating cbg's             -per diabetic manager 3/19: change Novolog correction to 0-9 units from 0-15 and consider Novolog 2 units TID meal coverage if eats >50% meal  CBG (last 3)  Recent Labs    02/02/23 1714 02/02/23 2051 02/03/23 0546  GLUCAP 107* 135* 91    Expect improvement once prednisone is d/ced   13: AKI atop CKD stage II (1.3-1.5 SCr baseline). History of renal cell carcinoma s/p left nephrectomy- pt up voiding much of the night             D/C IVFs and follow-up BMP             -encourage PO liquids- recorded intake only 268ml yesterday -     Latest Ref Rng & Units 02/01/2023    5:38 AM 01/31/2023    2:41 AM 01/30/2023    6:58 AM  BMP  Glucose 70 - 99 mg/dL 135  195  105   BUN 8 - 23 mg/dL 33  38  32   Creatinine 0.44 - 1.00 mg/dL 1.57  1.98  1.78   Sodium 135 - 145 mmol/L 137  135  135   Potassium 3.5 - 5.1 mmol/L 3.9  4.2  4.0   Chloride 98 - 111 mmol/L 110  103  104   CO2 22 - 32 mmol/L 21  20  24    Calcium 8.9 - 10.3 mg/dL 8.2  8.3  8.4       14: Morbid  obesity: BMI = 41.52          LOS: 3 days A FACE TO FACE EVALUATION WAS PERFORMED  [name] 02/03/2023, 7:47 AM

## 2023-02-03 NOTE — Progress Notes (Signed)
Physical Therapy Session Note  Patient Details  Name: [name] MRN: [id number] Date of Birth: [birth year]  Today's Date: 02/03/2023 PT Individual Time: 0803-0902; [id number] PT Individual Time Calculation (min): 59 min ; 68 min  Short Term Goals: Week 1:  PT Short Term Goal 1 (Week 1): Pt will perform transfer w/ CGA PT Short Term Goal 2 (Week 1): Pt will ambulate 150 ft w/ SBA PT Short Term Goal 3 (Week 1): Pt will initiate 6MWT  Skilled Therapeutic Interventions/Progress Updates:   Session 1:    Pt received seating upright in recliner chair and receptive to PT services. Pt expressed that she did not sleep well last night, contributing to her fatigue this morning. Pt attached to IV upon entrance. Therapist notified Nurse that MD provided the order to remove IV from pt during the day. Pt Donned shoes w/ Min A.  Nurse arrived to remove the IV. Sit<>stand w/ supervision w/o AD to don UE undergarments. Pt required Min A to don.  Pt engaged in LE NMR to encourage R sided wt bearing and improve motor output. Pt instructed to perform 1x5 forward lunges on each LE. Therpaist providing HHA. Pt cued to step LE forward and squat slowly down, and when stepping back, pt instructed to Wt shift to contralateral leg in order to step the forward LE back. Pt showed more hesitancy when having to wt shift on to R leg, however, performed the wt shift functionally.   Pt transported to ortho gym dependently for energy conservation. Engaged in [place] activity to work on UE motor control, cognition, and standing balance. Pt instructed to identify shapes and draw them using RUE only. Pt performed task for 2x5 minutes. Attempted to use nearby table for UE support, however, therapist encouraged pt to perform task w/o any UE support. Pt showed moderate UE motor deficits, however, w/ repetition demonstrated improvement. Pt expressing that she has to "concentrate really hard" to complete the task.   Pt transported  back to room dependently and ambulated in to the bathroom w/ RW and CGA. Continent of bowel and bladder and independent w/ pericare. Pt ambulated out the bathroom and returned to sit in recliner chair. Pt left w/ chair alarm on, call bell in reach, and all needs met.  Session 2:  Pt received sitting upright in recliner and agreeable to PT services. Pt continues to be fatigued, but willing to participate. Pt performed ~112ft of gait to the main therapy w/ RW and CGA. Pt exhibited R postural away when ambulating. Pt ran into an object on the R and when questioned if she had seen it she responded w/ "no" because she's "tired." Vc for pt to slow down and focus on controlling her movement.   Pt engaged in exercises to challenge dynamic balance and LE motor control and coordination. Performed w/ CGA and no AD. - Lateral squats 2x5. Vc to shift weight laterally than control descent  - Seated ball hits w/ 2# weighted bar 2x10  -Standing ball hits w/ 2# weighted bar 1x10  - Forward and Lateral step ups on to 3" step inside parallel bars 2x2 minutes. Second set pt instructed to not use any UE support. Pt struggled more w/ motor control w/o UE support. Pt required encouragement to improve self-efficacy of task.  - Alternating toe taps on to 3" step w/o UE support ~2 minutes   Pt transported back to room dependently in WC due to fatigue. Ambulated ~10 ft into the restroom w/ CGA w/o AD.  Continent of bladder and independently w/ pericare. Pt returned to sitting in recliner chair and was left w/ chair alarm on, call bell in reach and all needs met.    Therapy Documentation Precautions:  Precautions Precautions: Fall Restrictions Weight Bearing Restrictions: No General:  Pain: pt denies any pain         Therapy/Group: Individual Therapy  Baran Kuhrt 02/03/2023, 9:07 AM

## 2023-02-03 NOTE — Progress Notes (Signed)
Pt. Did not sleep well, had gone several times to the bathroom to void.

## 2023-02-03 NOTE — Progress Notes (Signed)
Occupational Therapy Session Note  Patient Details  Name: [name] MRN: [id number] Date of Birth: 05-18-1956  Today's Date: 02/03/2023 OT Individual Time: 1050-1205 OT Individual Time Calculation (min): 75 min    Short Term Goals: Week 1:  OT Short Term Goal 1 (Week 1): STGs = LTGs  Skilled Therapeutic Interventions/Progress Updates:    Pt received in recliner ready for therapy. Pt ambulated to gym with RW with Supervision.  In gym she worked with a looped exercise band, and practiced putting it over her feet to work towards donning her R sock more independently.  Used looped band for R shoulder exercises for proprioceptive feedback to increase sensory awareness of arm.  Looped band around thighs for sensory feedback to R leg with hip abd exercises.  Pt did state she can feel her arm moving more effectively now.  Pt practiced using R hand as a dominant hand with opening closing large jar,  smaller medication bottles.   Ambulated to kitchen and practiced reaching for items in fridge and cupboards without difficulty.  Ambulated to tub room and practiced stepping in and out of tub 2x with bars with close S.  Advised pt to step feet wider for increased room as she brings second foot over tu wall. Pt able to repeat practice without difficulty. Provided her long sponge to use tomorrow for her feet.  Pt ambulated back to room and used restroom with distant supervision.  Sat in recliner with alarm on and all needs met.   Therapy Documentation Precautions:  Precautions Precautions: Fall Restrictions Weight Bearing Restrictions: No  Pain:no c/o pain      Therapy/Group: Individual Therapy  [place] 02/03/2023, 9:01 AM

## 2023-02-03 NOTE — IPOC Note (Signed)
Overall Plan of Care [facility]) Patient Details Name: [name] MRN: [id number] DOB: October 26, 1956  Admitting Diagnosis: ICH (intracerebral hemorrhage) [facility])  Hospital Problems: Principal Problem:   ICH (intracerebral hemorrhage) ([place])     Functional Problem List: Nursing Bowel, Safety, Endurance, Medication Management  PT Balance, Endurance, Pain, Sensory, Motor  OT Balance, Motor, Sensory  SLP    TR         Basic ADL's: OT Bathing, Dressing, Toileting     Advanced  ADL's: OT Simple Meal Preparation, Light Housekeeping     Transfers: PT Bed Mobility, Bed to Chair, [employer], Teacher, early years/pre, Metallurgist: PT Ambulation, Emergency planning/management officer, Stairs     Additional Impairments: OT Fuctional Use of Upper Extremity  SLP        TR      Anticipated Outcomes Item Anticipated Outcome  Self Feeding independent  Swallowing      Basic self-care  Mod Independent  Toileting  Mod Independent   Bathroom Transfers supervision to transfer to garden tub, Mod I to toilet  Bowel/Bladder  manage bowel w mod I assist  Transfers  Mod I for transfers  Locomotion  Ambulation Mod I/ supervision for greater distances  Communication     Cognition     Pain  n/a  Safety/Judgment  manage safety w cues   Therapy Plan: PT Intensity: Minimum of 1-2 x/day ,45 to 90 minutes PT Frequency: 5 out of 7 days PT Duration Estimated Length of Stay: 10-12 days OT Intensity: Minimum of 1-2 x/day, 45 to 90 minutes OT Frequency: 5 out of 7 days OT Duration/Estimated Length of Stay: 8-10 days     Team Interventions: Nursing Interventions Patient/Family Education, Disease Management/Prevention, Discharge Planning, Pain Management, Bowel Management, Medication Management  PT interventions Ambulation/gait training, Balance/vestibular training, Cognitive remediation/compensation, Community reintegration, Discharge planning, Disease management/prevention, DME/adaptive equipment  instruction, Functional electrical stimulation, Functional mobility training, Pain management, Neuromuscular re-education, Patient/family education, Psychosocial support, Stair training, Splinting/orthotics, Therapeutic Activities, Therapeutic Exercise, UE/LE Strength taining/ROM, UE/LE Coordination activities, Visual/perceptual remediation/compensation, Wheelchair propulsion/positioning  OT Interventions Training and development officer, DME/adaptive equipment instruction, Discharge planning, Functional mobility training, Neuromuscular re-education, Psychosocial support, Patient/family education, Pain management, Self Care/advanced ADL retraining, UE/LE Strength taining/ROM, Therapeutic Exercise, Therapeutic Activities, UE/LE Coordination activities  SLP Interventions    TR Interventions    SW/CM Interventions Discharge Planning, Psychosocial Support, Patient/Family Education, Disease Management/Prevention   Barriers to Discharge MD  Medical stability and gout flare  Nursing Decreased caregiver support, Home environment access/layout 1 level/level apt solo; grandson goes to school and works 2nd shift; will be there at [facility]  PT Decreased caregiver support, IV antibiotics, Insurance for SNF coverage, Lack of/limited family support Pt has a hx of gout that causes pain when ambulating  OT      SLP      SW Insurance for SNF coverage, Decreased caregiver support, Lack of/limited family support     Team Discharge Planning: Destination: PT-Home ,OT- Home , SLP-  Projected Follow-up: PT-Home health PT, OT-  Home health OT, SLP-  Projected Equipment Needs: PT-To be determined, OT- To be determined, SLP-  Equipment Details: PT- , OT-  Patient/family involved in discharge planning: PT- Patient,  OT-Patient, SLP-   MD ELOS: 7-11d Medical Rehab Prognosis:  Excellent Assessment: The patient has been admitted for CIR therapies with the diagnosis of ICH. The team will be addressing functional mobility,  strength, stamina, balance, safety, adaptive techniques and equipment, self-care, bowel and bladder mgt, patient and caregiver  education, management of gout flare Left foot . Goals have been set at Mod I/sup. Anticipated discharge destination is Home .        See Team Conference Notes for weekly updates to the plan of care

## 2023-02-04 DIAGNOSIS — N182 Chronic kidney disease, stage 2 (mild): Secondary | ICD-10-CM

## 2023-02-04 LAB — GLUCOSE, CAPILLARY
Glucose-Capillary: 93 mg/dL (ref 70–99)
Glucose-Capillary: 88 mg/dL (ref 70–99)
Glucose-Capillary: 112 mg/dL — ABNORMAL HIGH (ref 70–99)
Glucose-Capillary: 111 mg/dL — ABNORMAL HIGH (ref 70–99)

## 2023-02-04 MED ORDER — LORATADINE 10 MG PO TABS
10.0000 mg | ORAL_TABLET | Freq: Every day | ORAL | Status: DC
  Administered 2023-02-04 – 2023-02-05 (×2): 10 mg via ORAL
  Filled 2023-02-04: qty 1

## 2023-02-04 MED ORDER — LORATADINE 10 MG PO TABS: 10.0000 mg | ORAL_TABLET | Freq: Every day | ORAL | Status: DC

## 2023-02-04 NOTE — Progress Notes (Signed)
Occupational Therapy Session Note  Patient Details  Name: [name] MRN: [id number] Date of Birth: [birth year]  Today's Date: 02/04/2023 OT Individual Time: [id number] OT Individual Time Calculation (min): 70 min   Today's Date: 02/04/2023 OT Individual Time: [id number] OT Individual Time Calculation (min): 50 min  Today's Date: 02/04/2023 OT Missed Time: 10 Minutes Missed Time Reason: Other (comment)  Short Term Goals: Week 1:  OT Short Term Goal 1 (Week 1): STGs = LTGs  Skilled Therapeutic Interventions/Progress Updates:   Session 1: Pt received sitting in recliner for skilled OT session with focus on FM activities and home-level functional mobility. Pt agreeable to interventions, demonstrating overall pleasant mood. Pt with no reports of pain. OT offering intermediate rest breaks and positioning suggestions throughout session to address potential pain/fatigue and maximize participation/safety in session.  Pt ambulates to all therapy locations (therapy gym & day room) with CGA + RW. In therapy gym, pt participates in isolated finger oppositon with use of tan thera-putty x3 sets. Pt with increased difficulty bringing 4th/5th digits to thumb.  Pt then participates in in-hand manipulation/translation and placement activity with use of coin-shaped items. Pt with moderate spillage of 2-3/5 items upon translating objects towards thumb and index finger. Pt stacks/unstacks objects into stacks of 3 & 5, completing with increased time. Pt reporting "I have to concentrate to do it."  Pt completes 9-hole peg test within 45 secs, dropping ~2 pegs with movement.   Pt then educated on RW trays and RW pouches, pouch issued and handout provided for RW tray for patient to consider purchasing at own discretion.   Pt completes 3/3 toileting activities with close supervision + RW/grab bars.  Pt remained sitting in recliner with all immediate needs met at end of session. Pt continues to be  appropriate for skilled OT intervention to promote further functional independence.   Session 2: Pt received resting in bed for skilled OT session with focus on FM activities. Pt agreeable to interventions, demonstrating, despite overall fatigue and GI discomfort Pt with unrated-pain, stating "I had diarrhea, now I just feel queasy and nauseas" in reference to GI discomfort. OT offering intermediate rest breaks and positioning suggestions throughout session to address pain/fatigue and maximize participation/safety in session.  Pt opting to participate in session at room-level due to previously stated discomfort. Pt comes to EOB with supervision + HOB elevated. Sitting EOB, pt participates in series of R-hand FM activities, including: -Retrieving beads from tan thera-putty -Stacking coins & flipping/organizing dollar bills -Replicating geometric design with color-coordinated push pins  Pt scoots toward HOB and returns to supine with supervision.  Pt remained resting in bed with visitor present and all other immediate needs met at end of session. Pt continues to be appropriate for skilled OT intervention to promote further functional independence.   Therapy Documentation Precautions:  Precautions Precautions: Fall Restrictions Weight Bearing Restrictions: No   Therapy/Group: Individual Therapy  Maudie Mercury, OTR/L, MSOT  02/04/2023, 5:28 AM

## 2023-02-04 NOTE — Progress Notes (Signed)
PROGRESS NOTE   Subjective/Complaints:  Reports some nausea that she thinks occurred after taking multiple medications at one time. This is now resolved.  NO additional concerns this AM.    LBM today  ROS- neg CP, SOB,N/V/D, + nasal congestion Nausea-improved  Objective:   No results found. No results for input(s): "WBC", "HGB", "HCT", "PLT" in the last 72 hours.  No results for input(s): "NA", "K", "CL", "CO2", "GLUCOSE", "BUN", "CREATININE", "CALCIUM" in the last 72 hours.   Intake/Output Summary (Last 24 hours) at 02/04/2023 1242 Last data filed at 02/04/2023 0731 Gross per 24 hour  Intake 358 ml  Output --  Net 358 ml         Physical Exam: Vital Signs Blood pressure (!) 151/79, pulse 88, temperature 98.1 F (36.7 C), resp. rate 18, height 5\' 2"  (1.575 m), weight 108.5 kg, last menstrual period 11/16/2002, SpO2 100 %.  General: No acute distress, sitting in bed Mood and affect are appropriate, pleasant Heart: Regular rate and rhythm no rubs murmurs or extra sounds Lungs: Clear to auscultation, breathing unlabored, no rales or wheezes, good air movement Abdomen: normoactive bowel sounds, soft nontender to palpation, nondistended Extremities: No clubbing, cyanosis, or edema Skin: No evidence of breakdown, no evidence of rash Neuro:  Eyes without evidence of nystagmus  Tone is normal without evidence of spasticity Cerebellar exam shows no evidence of ataxia on finger nose finger or heel to shin testing No evidence of trunkal ataxia  Motor strength is 5/5 in bilateral deltoid, biceps, triceps, finger flexors and extensors, wrist flexors and extensors, hip flexors, knee flexors and extensors, ankle dorsiflexors, plantar flexors, invertors and evertors, toe flexors and extensors  Sensory exam is normal to pinprick, proprioception and light touch in the upper and lower limbs   Cranial nerves II- Visual fields  are intact to confrontation testing, no blurring of vision III- no evidence of ptosis, upward, downward and medial gaze intact IV- no vertical diplopia or head tilt V- RIght V1 and V2 sensory loss VI- no pupil abduction weakness VII- no facial droop, good lid closure VII- normal auditory acuity IX- no pharygeal weakness, gag nl X- no pharyngeal weakness, no hoarseness XI- no trap or SCM weakness XII- no glossal weakness     Assessment/Plan: 1. Functional deficits which require 3+ hours per day of interdisciplinary therapy in a comprehensive inpatient rehab setting. Physiatrist is providing close team supervision and 24 hour management of active medical problems listed below. Physiatrist and rehab team continue to assess barriers to discharge/monitor patient progress toward functional and medical goals  Care Tool:  Bathing    Body parts bathed by patient: Right arm, Left arm, Chest, Abdomen, Front perineal area, Buttocks, Right upper leg, Left upper leg, Face   Body parts bathed by helper: Left lower leg, Right lower leg     Bathing assist Assist Level: Minimal Assistance - Patient > 75%     Upper Body Dressing/Undressing Upper body dressing   What is the patient wearing?: Pull over shirt    Upper body assist Assist Level: Set up assist    Lower Body Dressing/Undressing Lower body dressing      What is the  patient wearing?: Pants     Lower body assist Assist for lower body dressing: Minimal Assistance - Patient > 75%     Toileting Toileting    Toileting assist Assist for toileting: Supervision/Verbal cueing     Transfers Chair/bed transfer  Transfers assist     Chair/bed transfer assist level: Supervision/Verbal cueing     Locomotion Ambulation   Ambulation assist      Assist level: Contact Guard/Touching assist Assistive device: Walker-rolling Max distance: 140ft+   Walk 10 feet activity   Assist     Assist level: Contact Guard/Touching  assist Assistive device: Walker-rolling   Walk 50 feet activity   Assist    Assist level: Contact Guard/Touching assist Assistive device: Walker-rolling    Walk 150 feet activity   Assist    Assist level: Contact Guard/Touching assist Assistive device: Walker-rolling    Walk 10 feet on uneven surface  activity   Assist     Assist level: Contact Guard/Touching assist Assistive device: Other (comment) (HR)   Wheelchair     Assist Is the patient using a wheelchair?: Yes Type of Wheelchair: Manual    Wheelchair assist level: Minimal Assistance - Patient > 75% Max wheelchair distance: 55ft    Wheelchair 50 feet with 2 turns activity    Assist        Assist Level: Moderate Assistance - Patient 50 - 74%   Wheelchair 150 feet activity     Assist      Assist Level: Total Assistance - Patient < 25%   Blood pressure (!) 151/79, pulse 88, temperature 98.1 F (36.7 C), resp. rate 18, height 5\' 2"  (1.575 m), weight 108.5 kg, last menstrual period 11/16/2002, SpO2 100 %.  Medical Problem List and Plan: 1. Functional deficits secondary to left thalamic ICH d/t hypertension with additional subacute infarcts in the left fronto-parietal white matter infarcts d/t small vessel disease             -patient may shower             -ELOS/Goals: 7-11 days, mod I to supervision  -Continue CIR PT/OT  -Ambulated 175 feet with therapy   2.  Antithrombotics: -DVT/anticoagulation:  Mechanical:  Antiembolism stockings, knee (TED hose) Bilateral lower extremities             -antiplatelet therapy:  Consider restarting 81 mg aspirin in 2 weeks after repeat CT head   3. Pain Management: Tylenol as needed   4. Mood/Behavior/Sleep: LCSW to evaluate and provide emotional support             -antipsychotic agents: n/a   5. Neuropsych/cognition: This patient is capable of making decisions on her own behalf.   6. Skin/Wound Care: Routine skin care checks   7.  Fluids/Electrolytes/Nutrition: Routine Is and Os and follow-up chemistries   8: Hypertension: monitor TID and prn             -continue Coreg 6.25 mg BID             -continue Norvasc 10 mg daily             -bp currently borderline. May be related to prednisone  -3/24 increase coreg to 12.5 mg BID Vitals:   02/04/23 1754 02/05/23 0453  BP: (!) 165/86 (!) 169/81  Pulse: 92 89  Resp: 15 20  Temp: 98.2 F (36.8 C) 98.8 F (37.1 C)  SpO2: 95% 99%    9: Hyperlipidemia: continue statin   10: Acute left  forefoot gout +/- cellulitis: already appears improved today -continue colchicine -continue prednisone 40 mg daily through 3/21 -Ancef 1 mg q 8 hours through 3/23 but no evidence of cellulitis , gout is being treated will D/C   11: GERD/HH/hx of GI bleed, H. pylori: continue Protonix 40 mg daily   12: DM: CBGs q AC and q HS; home med Byetta 2 mg q Saturday             -continue carb modified diet;  prednisone elevating cbg's             -per diabetic manager 3/19: change Novolog correction to 0-9 units from 0-15 and consider Novolog 2 units TID meal coverage if eats >50% meal  CBG (last 3)  Recent Labs    02/03/23 2008 02/04/23 0618 02/04/23 1139  GLUCAP 137* 93 88    Expect improvement once prednisone is d/ced 3/24 CBGs well controlled , continue correctional insulin   13: AKI atop CKD stage II (1.3-1.5 SCr baseline). History of renal cell carcinoma s/p left nephrectomy- pt up voiding much of the night             D/C IVFs and follow-up BMP             -encourage PO liquids- recorded intake only 26ml yesterday -   -Recheck labs Monday  3/23 encouraged fluids    Latest Ref Rng & Units 02/01/2023    5:38 AM 01/31/2023    2:41 AM 01/30/2023    6:58 AM  BMP  Glucose 70 - 99 mg/dL 135  195  105   BUN 8 - 23 mg/dL 33  38  32   Creatinine 0.44 - 1.00 mg/dL 1.57  1.98  1.78   Sodium 135 - 145 mmol/L 137  135  135   Potassium 3.5 - 5.1 mmol/L 3.9  4.2  4.0   Chloride 98 - 111  mmol/L 110  103  104   CO2 22 - 32 mmol/L 21  20  24    Calcium 8.9 - 10.3 mg/dL 8.2  8.3  8.4       14: Morbid  obesity: BMI = 41.52  15. Nausea with medications  -Discussed with pharmacy-appreciate assistance ,adjust Crestor and Claritin to HS          LOS: 4 days A FACE TO FACE EVALUATION WAS PERFORMED  Jennye Boroughs 02/04/2023, 12:42 PM

## 2023-02-04 NOTE — Progress Notes (Signed)
Physical Therapy Session Note  Patient Details  Name: Ted Alejandro MRN: [id number] Date of Birth: [birth year]  Today's Date: 02/04/2023 PT Individual Time: [id number] PT Individual Time Calculation (min): 74 min   Short Term Goals: Week 1:  PT Short Term Goal 1 (Week 1): Pt will perform transfer w/ CGA PT Short Term Goal 2 (Week 1): Pt will ambulate 150 ft w/ SBA PT Short Term Goal 3 (Week 1): Pt will initiate 6MWT  Skilled Therapeutic Interventions/Progress Updates:   Pt received sitting in recliner and agreeable to PT services. Pt performed sit<>stand w/ supervision and ambulated into the bathroom w/ supervision. Pt continent of bladder and independent w/ pericare. Pt performed ~150 ft of gait to main therapy gym w/ RW and close supervision. Pt demonstrated improved safety awareness by negotiating around obstacles on the R. Pt continues to swing legs far out in front of her. Vc for pt to slow down speed to avoid kicking RW and LOB.   Pt engaged in interventions to improve balance, NMR and gait w/ CGA - Pt instructed to walk laterally, forward, backwards, in between 6 cones box ~53ft in length w/ CGA x3. Performed using tidal tank to challenge balance. Pt demonstrated functional wt shifts when walking laterally and had not LOB.  -Stepping over 3" hurdle 2x2. Pt expressed fear of falling. Therapist encouraged pt and provided vc on how to safely step over hurdle. Cues consisted of wt shifting on to stance, balance on leg and when she feels safe step over. Pt was able to demonstrate this. Progressed exercise to stepping over 4 continuous hurdles ~20 ft to challenge pt's dynamic balance. Pt showed mod hesitation at each hurdle, and explained that she was thinking of what to do before going. Therapist explained that that was appropriate and showed improved motor planning strategies. Pt then performed ~20 ft of hurdles w/ lateral cone taps in between each hurdle. Pt demonstrated improved carryover  by demonstrating lateral wt shifts when going to tap cone w/ contralateral LE.   Pt ambulated ~153ft back to room. Performed ~72ft of gait into the bathroom w/o RW. Pt continent of bladder and independent w/ pericare. Pt returned to sitting in recliner and was left w/ chair alarm on and in the care of Nurse Tech.   Therapy Documentation Precautions:  Precautions Precautions: Fall Restrictions Weight Bearing Restrictions: No General:  Pain: Pt denies any pain         Therapy/Group: Individual Therapy  Tyrone Balash 02/04/2023, 12:40 PM

## 2023-02-05 DIAGNOSIS — R110 Nausea: Secondary | ICD-10-CM

## 2023-02-05 LAB — GLUCOSE, CAPILLARY
Glucose-Capillary: 108 mg/dL — ABNORMAL HIGH (ref 70–99)
Glucose-Capillary: 99 mg/dL (ref 70–99)
Glucose-Capillary: 96 mg/dL (ref 70–99)
Glucose-Capillary: 144 mg/dL — ABNORMAL HIGH (ref 70–99)

## 2023-02-05 MED ORDER — CARVEDILOL 12.5 MG PO TABS
12.5000 mg | ORAL_TABLET | Freq: Two times a day (BID) | ORAL | Status: DC
  Administered 2023-02-05 – 2023-02-10 (×10): 12.5 mg via ORAL
  Filled 2023-02-05 (×10): qty 1

## 2023-02-05 MED ORDER — ROSUVASTATIN CALCIUM 20 MG PO TABS
20.0000 mg | ORAL_TABLET | Freq: Every day | ORAL | Status: DC
  Administered 2023-02-06 – 2023-02-09 (×4): 20 mg via ORAL
  Filled 2023-02-05 (×4): qty 1

## 2023-02-05 MED ORDER — LORATADINE 10 MG PO TABS
10.0000 mg | ORAL_TABLET | Freq: Every day | ORAL | Status: DC
  Filled 2023-02-05 (×3): qty 1

## 2023-02-05 NOTE — Progress Notes (Signed)
PROGRESS NOTE   Subjective/Complaints:  Reports some nausea that she thinks occurred after taking multiple medications at one time. This is now resolved.  NO additional concerns this AM.    LBM today  ROS- neg CP, SOB,N/V/D, + nasal congestion Nausea-improved  Objective:   No results found. No results for input(s): "WBC", "HGB", "HCT", "PLT" in the last 72 hours.  No results for input(s): "NA", "K", "CL", "CO2", "GLUCOSE", "BUN", "CREATININE", "CALCIUM" in the last 72 hours.   Intake/Output Summary (Last 24 hours) at 02/05/2023 1251 Last data filed at 02/05/2023 0750 Gross per 24 hour  Intake 712 ml  Output 900 ml  Net -188 ml         Physical Exam: Vital Signs Blood pressure (!) 169/81, pulse 89, temperature 98.8 F (37.1 C), resp. rate 20, height 5\' 2"  (1.575 m), weight 108.5 kg, last menstrual period 11/16/2002, SpO2 99 %.  General: No acute distress, sitting in bed Mood and affect are appropriate, pleasant Heart: Regular rate and rhythm no rubs murmurs or extra sounds Lungs: Clear to auscultation, breathing unlabored, no rales or wheezes, good air movement Abdomen: normoactive bowel sounds, soft nontender to palpation, nondistended Extremities: No clubbing, cyanosis, or edema Skin: No evidence of breakdown, no evidence of rash Neuro:  Eyes without evidence of nystagmus  Tone is normal without evidence of spasticity Cerebellar exam shows no evidence of ataxia on finger nose finger or heel to shin testing No evidence of trunkal ataxia  Motor strength is 5/5 in bilateral deltoid, biceps, triceps, finger flexors and extensors, wrist flexors and extensors, hip flexors, knee flexors and extensors, ankle dorsiflexors, plantar flexors, invertors and evertors, toe flexors and extensors  Sensory exam is normal to pinprick, proprioception and light touch in the upper and lower limbs   Cranial nerves II- Visual  fields are intact to confrontation testing, no blurring of vision III- no evidence of ptosis, upward, downward and medial gaze intact IV- no vertical diplopia or head tilt V- RIght V1 and V2 sensory loss VI- no pupil abduction weakness VII- no facial droop, good lid closure VII- normal auditory acuity IX- no pharygeal weakness, gag nl X- no pharyngeal weakness, no hoarseness XI- no trap or SCM weakness XII- no glossal weakness     Assessment/Plan: 1. Functional deficits which require 3+ hours per day of interdisciplinary therapy in a comprehensive inpatient rehab setting. Physiatrist is providing close team supervision and 24 hour management of active medical problems listed below. Physiatrist and rehab team continue to assess barriers to discharge/monitor patient progress toward functional and medical goals  Care Tool:  Bathing    Body parts bathed by patient: Right arm, Left arm, Chest, Abdomen, Front perineal area, Buttocks, Right upper leg, Left upper leg, Face   Body parts bathed by helper: Left lower leg, Right lower leg     Bathing assist Assist Level: Minimal Assistance - Patient > 75%     Upper Body Dressing/Undressing Upper body dressing   What is the patient wearing?: Pull over shirt    Upper body assist Assist Level: Set up assist    Lower Body Dressing/Undressing Lower body dressing      What is  the patient wearing?: Pants     Lower body assist Assist for lower body dressing: Minimal Assistance - Patient > 75%     Toileting Toileting    Toileting assist Assist for toileting: Supervision/Verbal cueing     Transfers Chair/bed transfer  Transfers assist     Chair/bed transfer assist level: Supervision/Verbal cueing     Locomotion Ambulation   Ambulation assist      Assist level: Contact Guard/Touching assist Assistive device: Walker-rolling Max distance: 121ft+   Walk 10 feet activity   Assist     Assist level: Contact  Guard/Touching assist Assistive device: Walker-rolling   Walk 50 feet activity   Assist    Assist level: Contact Guard/Touching assist Assistive device: Walker-rolling    Walk 150 feet activity   Assist    Assist level: Contact Guard/Touching assist Assistive device: Walker-rolling    Walk 10 feet on uneven surface  activity   Assist     Assist level: Contact Guard/Touching assist Assistive device: Other (comment) (HR)   Wheelchair     Assist Is the patient using a wheelchair?: Yes Type of Wheelchair: Manual    Wheelchair assist level: Minimal Assistance - Patient > 75% Max wheelchair distance: 69ft    Wheelchair 50 feet with 2 turns activity    Assist        Assist Level: Moderate Assistance - Patient 50 - 74%   Wheelchair 150 feet activity     Assist      Assist Level: Total Assistance - Patient < 25%   Blood pressure (!) 169/81, pulse 89, temperature 98.8 F (37.1 C), resp. rate 20, height 5\' 2"  (1.575 m), weight 108.5 kg, last menstrual period 11/16/2002, SpO2 99 %.  Medical Problem List and Plan: 1. Functional deficits secondary to left thalamic ICH d/t hypertension with additional subacute infarcts in the left fronto-parietal white matter infarcts d/t small vessel disease             -patient may shower             -ELOS/Goals: 7-11 days, mod I to supervision  -Continue CIR PT/OT  -Ambulated 175 feet with therapy   2.  Antithrombotics: -DVT/anticoagulation:  Mechanical:  Antiembolism stockings, knee (TED hose) Bilateral lower extremities             -antiplatelet therapy:  Consider restarting 81 mg aspirin in 2 weeks after repeat CT head   3. Pain Management: Tylenol as needed   4. Mood/Behavior/Sleep: LCSW to evaluate and provide emotional support             -antipsychotic agents: n/a   5. Neuropsych/cognition: This patient is capable of making decisions on her own behalf.   6. Skin/Wound Care: Routine skin care checks    7. Fluids/Electrolytes/Nutrition: Routine Is and Os and follow-up chemistries   8: Hypertension: monitor TID and prn             -continue Coreg 6.25 mg BID             -continue Norvasc 10 mg daily             -bp currently borderline. May be related to prednisone  -3/24 increase coreg to 12.5 mg BID Vitals:   02/04/23 1754 02/05/23 0453  BP: (!) 165/86 (!) 169/81  Pulse: 92 89  Resp: 15 20  Temp: 98.2 F (36.8 C) 98.8 F (37.1 C)  SpO2: 95% 99%    9: Hyperlipidemia: continue statin   10: Acute  left forefoot gout +/- cellulitis: already appears improved today -continue colchicine -continue prednisone 40 mg daily through 3/21 -Ancef 1 mg q 8 hours through 3/23 but no evidence of cellulitis , gout is being treated will D/C   11: GERD/HH/hx of GI bleed, H. pylori: continue Protonix 40 mg daily   12: DM: CBGs q AC and q HS; home med [place]etta 2 mg q Saturday             -continue carb modified diet;  prednisone elevating cbg's             -per diabetic manager 3/19: change Novolog correction to 0-9 units from 0-15 and consider Novolog 2 units TID meal coverage if eats >50% meal  CBG (last 3)  Recent Labs    02/04/23 2020 02/05/23 0652 02/05/23 1129  GLUCAP 111* 108* 99    Expect improvement once prednisone is d/ced 3/24 CBGs well controlled , continue correctional insulin   13: AKI atop CKD stage II (1.3-1.5 SCr baseline). History of renal cell carcinoma s/p left nephrectomy- pt up voiding much of the night             D/C IVFs and follow-up BMP             -encourage PO liquids- recorded intake only 245ml yesterday -   -Recheck labs Monday  3/23 encouraged fluids    Latest Ref Rng & Units 02/01/2023    5:38 AM 01/31/2023    2:41 AM 01/30/2023    6:58 AM  BMP  Glucose 70 - 99 mg/dL 135  195  105   BUN 8 - 23 mg/dL 33  38  32   Creatinine 0.44 - 1.00 mg/dL 1.57  1.98  1.78   Sodium 135 - 145 mmol/L 137  135  135   Potassium 3.5 - 5.1 mmol/L 3.9  4.2  4.0   Chloride 98  - 111 mmol/L 110  103  104   CO2 22 - 32 mmol/L 21  20  24    Calcium 8.9 - 10.3 mg/dL 8.2  8.3  8.4       14: Morbid  obesity: BMI = 41.52  15. Nausea with medications  -Discussed with pharmacy-appreciate assistance ,adjust Crestor and Claritin to HS          LOS: 5 days A FACE TO FACE EVALUATION WAS PERFORMED  Jennye Boroughs 02/05/2023, 12:51 PM

## 2023-02-06 LAB — BASIC METABOLIC PANEL
Anion gap: 10 (ref 5–15)
BUN: 22 mg/dL (ref 8–23)
CO2: 23 mmol/L (ref 22–32)
Calcium: 8.4 mg/dL — ABNORMAL LOW (ref 8.9–10.3)
Chloride: 106 mmol/L (ref 98–111)
Creatinine, Ser: 1.46 mg/dL — ABNORMAL HIGH (ref 0.44–1.00)
GFR, Estimated: 40 mL/min — ABNORMAL LOW (ref >60)
Glucose, Bld: 99 mg/dL (ref 70–99)
Potassium: 3.2 mmol/L — ABNORMAL LOW (ref 3.5–5.1)
Sodium: 139 mmol/L (ref 135–145)

## 2023-02-06 LAB — CBC
HCT: 41.6 % (ref 36.0–46.0)
Hemoglobin: 13 g/dL (ref 12.0–15.0)
MCH: 27.8 pg (ref 26.0–34.0)
MCHC: 31.3 g/dL (ref 30.0–36.0)
MCV: 89.1 fL (ref 80.0–100.0)
Platelets: 248 10*3/uL (ref 150–400)
RBC: 4.67 MIL/uL (ref 3.87–5.11)
RDW: 13.9 % (ref 11.5–15.5)
WBC: 6.3 10*3/uL (ref 4.0–10.5)
nRBC: 0 % (ref 0.0–0.2)

## 2023-02-06 LAB — GLUCOSE, CAPILLARY
Glucose-Capillary: 99 mg/dL (ref 70–99)
Glucose-Capillary: 87 mg/dL (ref 70–99)
Glucose-Capillary: 87 mg/dL (ref 70–99)
Glucose-Capillary: 132 mg/dL — ABNORMAL HIGH (ref 70–99)

## 2023-02-06 MED ORDER — POTASSIUM CHLORIDE CRYS ER 20 MEQ PO TBCR
40.0000 meq | EXTENDED_RELEASE_TABLET | Freq: Every day | ORAL | Status: AC
  Administered 2023-02-06 – 2023-02-07 (×2): 40 meq via ORAL
  Filled 2023-02-06 (×2): qty 2

## 2023-02-06 NOTE — Progress Notes (Signed)
Occupational Therapy Session Note  Patient Details  Name: Yoandra Pietila MRN: [id number] Date of Birth: 09-Jul-1956  Today's Date: 02/06/2023 OT Individual Time: [id number]  OT Individual Time Calculation (min): 60 min    Short Term Goals: Week 1:  OT Short Term Goal 1 (Week 1): STGs = LTGs  Skilled Therapeutic Interventions/Progress Updates:     no c/o pain Pt received in recliner ready for therapy.  Pt used RW to ambulate to toilet, completed all toileting with mod I.  She then ambulated to sink to wash her hands.  Close S with ambulation with rW in hallway to gym..  Pt sat on mat to work on [place] with 3 sets of chest presses 12x with 2 lb dowel bar and shoulder retraction with rows 12x with resistance band.   Pt then worked on [employer] with picking up small items without difficulty, sorting paper money, turning pages of newspaper.   She ambulated back to her room with RW using improved gait pattern with less of a kick out and used toilet a second time without A. Pt resting in recliner with all needs met. Alarm set.   Therapy Documentation Precautions:  Precautions Precautions: Fall Restrictions Weight Bearing Restrictions: No ADL: ADL Eating: Independent Grooming: Setup Upper Body Bathing: Setup Lower Body Bathing: Minimal assistance Upper Body Dressing: Supervision/safety Lower Body Dressing: Minimal assistance Toileting: Modified independent Toilet Transfer: Close supervision Toilet Transfer Method: Counselling psychologist: Emergency planning/management officer Transfer: Curator Method: Heritage manager: Radio broadcast assistant, Grab bars   Therapy/Group: Individual Therapy  Nickoli Bagheri 02/06/2023, 2:53 PM

## 2023-02-06 NOTE — Progress Notes (Signed)
Physical Therapy Session Note  Patient Details  Name: [name] MRN: [id number] Date of Birth: 20-May-1956  Today's Date: 02/06/2023 PT Individual Time: [id number] PT Individual Time Calculation (min): 70 min   Short Term Goals: Week 1:  PT Short Term Goal 1 (Week 1): Pt will perform transfer w/ CGA PT Short Term Goal 2 (Week 1): Pt will ambulate 150 ft w/ SBA PT Short Term Goal 3 (Week 1): Pt will initiate 6MWT  Skilled Therapeutic Interventions/Progress Updates:    Pt received sitting in recliner chair w/ legs elevated and agreeable to PT services. Pt denies any pain. MD entered to assess pt. Pt has been on IV fluids at night and subsequently has increased B swelling. B knee high Ted hose placed on pt to address swelling. Nurse entered to administer pt's medications. Pt educated on performing chin tuck when taking pills to avoid choking and ease swallowing. Pt donned shoes w/ Min A in sitting.  Sit<>stand SBA and ambulated into bathroom w/ close supervision. Pt continent of bladder and independent w/ pericare. Pt performed ~116ft of gait to main therapy gym and performed dynamic gait exercises to work on motor planning/control and balance w/ CGA -Stepping over 4 continuous 6" hurdles ~25 ft. Pt showed less hesitation compared to last session, however, maintains that she still needs to concentrate to step over hurdle.  - performed ~25 ft of hurdles w/ lateral cone taps in between each hurdle. Pt demonstrated improved carryover by demonstrating lateral wt shifts when going to tap cone w/ contralateral LE. Pt showed minor lateral LOB, however, exhibited functional balance strategies to recover w/ CGA.   Pt expressed that she is fearful of falling. Therapist verbalized steps for fall recovery and explained that next session will focus on practicing those steps so that pt feels more confident if a fall does occur. Pt ambulated ~149ft to room w/ RW and SBA and was left sitting in recliner w/  chair alarm on, call bell in reach and all needs met.    Therapy Documentation Precautions:  Precautions Precautions: Fall Restrictions Weight Bearing Restrictions: No General:       Therapy/Group: Individual Therapy  Garnell Phenix 02/06/2023, 10:10 AM

## 2023-02-06 NOTE — Progress Notes (Signed)
Occupational Therapy Session Note  Patient Details  Name: [name] MRN: [id number] Date of Birth: Jul 30, 1956  Today's Date: 02/06/2023 OT Individual Time: [id number] OT Individual Time Calculation (min): 71 min    Short Term Goals: Week 1:  OT Short Term Goal 1 (Week 1): STGs = LTGs  Skilled Therapeutic Interventions/Progress Updates:     Pt received sitting up in recliner receptive to skilled OT session reporting 0/10 pain. Rest breaks and encouragement provided throughout session to support participation. Pt requesting to take shower this session. Discussed bathroom safety, grab bar use, and sitting down during bathing tasks to increase safety prior to shower. Pt doffed clothing in sitting and standing to bring pants off waist supervision using RW. Pt completed functional mobility to bathroom and transferred to tub bench in walk-in shower with CGA using grab bars. Pt completed U/LB bathing at overall supervision level with OT providing VB cues for safety when standing to wash bottom, to initiate using RUE during bathing, and for education on long handled sponge use. Following shower, Pt completed U/LB dressing sitting EOB with supervision for dynamic sitting balance challenge with Pt able to complete at supervision level. OT donned TED hose total A for time management and Pt donned shoes with supervision with OT providing assistance with tying shoes d/t decreased sensation. Pt completed functional mobility to ADL bathroom using RW close supervision. OT and Pt discussed tub transfer with Pt reporting she has a single grab bar on outside of shower. Discussed receiving assistance from niece or grand son when transferring into tub to increase safety with Pt receptive. Problem solved that Pt can wear her house coat during transfer with her grand son to increase Pt privacy. Demonstrated transfer and provided opportunity for Pt to practice completing at [facility] level with cues provided for safety. Pt  ambulated back to her room and requested to use restroom at end of session. Pt able to complete 3/3 toileting tasks mod I using RW. Pt transferred to her bed supervision and was left resting in bed with call bell in reach, bed alarm on, and all needs met.    Therapy Documentation Precautions:  Precautions Precautions: Fall Restrictions Weight Bearing Restrictions: No General:   Vital Signs: Therapy Vitals Temp: (Abnormal) 97.5 F (36.4 C) Temp Source: Oral Pulse Rate: 81 Resp: 18 BP: (Abnormal) 148/82 Patient Position (if appropriate): Sitting Oxygen Therapy SpO2: 100 % O2 Device: Room Air    Therapy/Group: Individual Therapy  Janey Genta 02/06/2023, 2:59 PM

## 2023-02-06 NOTE — Progress Notes (Signed)
PROGRESS NOTE   Subjective/Complaints:  Reports some nausea that she thinks occurred after taking multiple medications at one time. This is now resolved.  NO additional concerns this AM.    LBM today  ROS- neg CP, SOB,N/V/D, + nasal congestion Nausea-improved  Objective:   No results found. Recent Labs    02/06/23 0631  WBC 6.3  HGB 13.0  HCT 41.6  PLT 248    Recent Labs    02/06/23 0631  NA 139  K 3.2*  CL 106  CO2 23  GLUCOSE 99  BUN 22  CREATININE 1.46*  CALCIUM 8.4*     Intake/Output Summary (Last 24 hours) at 02/06/2023 0805 Last data filed at 02/06/2023 0659 Gross per 24 hour  Intake 1068 ml  Output --  Net 1068 ml         Physical Exam: Vital Signs Blood pressure (!) 157/83, pulse 96, temperature 98.6 F (37 C), temperature source Oral, resp. rate 16, height 5\' 2"  (1.575 m), weight 108.5 kg, last menstrual period 11/16/2002, SpO2 100 %.  General: No acute distress, sitting in bed Mood and affect are appropriate, pleasant Heart: Regular rate and rhythm no rubs murmurs or extra sounds Lungs: Clear to auscultation, breathing unlabored, no rales or wheezes, good air movement Abdomen: normoactive bowel sounds, soft nontender to palpation, nondistended Extremities: No clubbing, cyanosis, or edema Skin: No evidence of breakdown, no evidence of rash Neuro:  Eyes without evidence of nystagmus  Tone is normal without evidence of spasticity Cerebellar exam - mild sensory ataxia noted BUE, BLE  Motor strength is 5/5 in bilateral deltoid, biceps, triceps, finger flexors and extensors, wrist flexors and extensors, hip flexors, knee flexors and extensors, ankle dorsiflexors, plantar flexors, invertors and evertors, toe flexors and extensors  Sensory exam is normal to pinprick, proprioception and light touch in the upper and lower limbs   Cranial nerves II- Visual fields are intact to confrontation  testing, no blurring of vision III- no evidence of ptosis, upward, downward and medial gaze intact IV- no vertical diplopia or head tilt V- RIght V1 and V2 sensory loss VI- no pupil abduction weakness VII- no facial droop, good lid closure VII- normal auditory acuity IX- no pharygeal weakness, gag nl X- no pharyngeal weakness, no hoarseness XI- no trap or SCM weakness XII- no glossal weakness     Assessment/Plan: 1. Functional deficits which require 3+ hours per day of interdisciplinary therapy in a comprehensive inpatient rehab setting. Physiatrist is providing close team supervision and 24 hour management of active medical problems listed below. Physiatrist and rehab team continue to assess barriers to discharge/monitor patient progress toward functional and medical goals  Care Tool:  Bathing    Body parts bathed by patient: Right arm, Left arm, Chest, Abdomen, Front perineal area, Buttocks, Right upper leg, Left upper leg, Face   Body parts bathed by helper: Left lower leg, Right lower leg     Bathing assist Assist Level: Minimal Assistance - Patient > 75%     Upper Body Dressing/Undressing Upper body dressing   What is the patient wearing?: Pull over shirt    Upper body assist Assist Level: Set up assist  Lower Body Dressing/Undressing Lower body dressing      What is the patient wearing?: Pants     Lower body assist Assist for lower body dressing: Minimal Assistance - Patient > 75%     Toileting Toileting    Toileting assist Assist for toileting: Supervision/Verbal cueing     Transfers Chair/bed transfer  Transfers assist     Chair/bed transfer assist level: Supervision/Verbal cueing     Locomotion Ambulation   Ambulation assist      Assist level: Contact Guard/Touching assist Assistive device: Walker-rolling Max distance: 187ft+   Walk 10 feet activity   Assist     Assist level: Contact Guard/Touching assist Assistive device:  Walker-rolling   Walk 50 feet activity   Assist    Assist level: Contact Guard/Touching assist Assistive device: Walker-rolling    Walk 150 feet activity   Assist    Assist level: Contact Guard/Touching assist Assistive device: Walker-rolling    Walk 10 feet on uneven surface  activity   Assist     Assist level: Contact Guard/Touching assist Assistive device: Other (comment) (HR)   Wheelchair     Assist Is the patient using a wheelchair?: Yes Type of Wheelchair: Manual    Wheelchair assist level: Minimal Assistance - Patient > 75% Max wheelchair distance: 18ft    Wheelchair 50 feet with 2 turns activity    Assist        Assist Level: Moderate Assistance - Patient 50 - 74%   Wheelchair 150 feet activity     Assist      Assist Level: Total Assistance - Patient < 25%   Blood pressure (!) 157/83, pulse 96, temperature 98.6 F (37 C), temperature source Oral, resp. rate 16, height 5\' 2"  (1.575 m), weight 108.5 kg, last menstrual period 11/16/2002, SpO2 100 %.  Medical Problem List and Plan: 1. Functional deficits secondary to left thalamic ICH d/t hypertension with additional subacute infarcts in the left fronto-parietal white matter infarcts d/t small vessel disease             -patient may shower             -ELOS/Goals: 7-11 days, mod I to supervision  -Continue CIR PT/OT  -Ambulated 175 feet with therapy   2.  Antithrombotics: -DVT/anticoagulation:  Mechanical:  Antiembolism stockings, knee (TED hose) Bilateral lower extremities             -antiplatelet therapy:  Consider restarting 81 mg aspirin in 2 weeks after repeat CT head-will order for THursday, expect d/c later in the week    3. Pain Management: Tylenol as needed   4. Mood/Behavior/Sleep: LCSW to evaluate and provide emotional support             -antipsychotic agents: n/a   5. Neuropsych/cognition: This patient is capable of making decisions on her own behalf.   6.  Skin/Wound Care: Routine skin care checks   7. Fluids/Electrolytes/Nutrition: Routine Is and Os and follow-up chemistries   8: Hypertension: monitor TID and prn             -continue Coreg 6.25 mg BID             -continue Norvasc 10 mg daily             -bp currently borderline. May be related to prednisone  -3/24 increase coreg to 12.5 mg BID Vitals:   02/05/23 1924 02/06/23 0400  BP: 136/80 (!) 157/83  Pulse: 90 96  Resp: 18 16  Temp: 98.1 F (36.7 C) 98.6 F (37 C)  SpO2: 98% 100%    9: Hyperlipidemia: continue statin   10: Acute left forefoot gout +/- cellulitis: already appears improved today -continue colchicine -continue prednisone 40 mg daily through 3/21 -Ancef 1 mg q 8 hours through 3/23 but no evidence of cellulitis , gout is being treated will D/C   11: GERD/HH/hx of GI bleed, H. pylori: continue Protonix 40 mg daily   12: DM: CBGs q AC and q HS; home med Byetta 2 mg q Saturday             -continue carb modified diet;  prednisone elevating cbg's             -per diabetic manager 3/19: change Novolog correction to 0-9 units from 0-15 and consider Novolog 2 units TID meal coverage if eats >50% meal  CBG (last 3)  Recent Labs    02/05/23 1638 02/05/23 2042 02/06/23 0612  GLUCAP 96 144* 99   Improved off prednisone - appears to be running normal CBGs off prednisone will monitor for  a couple more days , consider d/c if running normal values    13: AKI atop CKD stage II (1.3-1.5 SCr baseline). Back to baseline     Latest Ref Rng & Units 02/06/2023    6:31 AM 02/01/2023    5:38 AM 01/31/2023    2:41 AM  BMP  Glucose 70 - 99 mg/dL 99  135  195   BUN 8 - 23 mg/dL 22  33  38   Creatinine 0.44 - 1.00 mg/dL 1.46  1.57  1.98   Sodium 135 - 145 mmol/L 139  137  135   Potassium 3.5 - 5.1 mmol/L 3.2  3.9  4.2   Chloride 98 - 111 mmol/L 106  110  103   CO2 22 - 32 mmol/L 23  21  20    Calcium 8.9 - 10.3 mg/dL 8.4  8.2  8.3      KCL 35meq qam x 2 d, recheck BMET mid  week 14: Morbid  obesity: BMI = 41.52  15. Nausea with medications  -Discussed with pharmacy-appreciate assistance ,adjust Crestor and Claritin to HS          LOS: 6 days A FACE TO FACE EVALUATION WAS PERFORMED  Charlett Blake 02/06/2023, 8:05 AM

## 2023-02-07 LAB — GLUCOSE, CAPILLARY
Glucose-Capillary: 93 mg/dL (ref 70–99)
Glucose-Capillary: 85 mg/dL (ref 70–99)
Glucose-Capillary: 109 mg/dL — ABNORMAL HIGH (ref 70–99)
Glucose-Capillary: 133 mg/dL — ABNORMAL HIGH (ref 70–99)

## 2023-02-07 NOTE — Progress Notes (Signed)
PROGRESS NOTE   Subjective/Complaints:  RIght shoulder pain , discussed with LPN who is requesting K pad, no falls or trauma to the area.  LBM today  ROS- neg CP, SOB,N/V/D, + nasal congestion Nausea-improved  Objective:   No results found. Recent Labs    02/06/23 0631  WBC 6.3  HGB 13.0  HCT 41.6  PLT 248    Recent Labs    02/06/23 0631  NA 139  K 3.2*  CL 106  CO2 23  GLUCOSE 99  BUN 22  CREATININE 1.46*  CALCIUM 8.4*     Intake/Output Summary (Last 24 hours) at 02/07/2023 0751 Last data filed at 02/07/2023 0746 Gross per 24 hour  Intake 568.5 ml  Output --  Net 568.5 ml         Physical Exam: Vital Signs Blood pressure 136/65, pulse 85, temperature 98.4 F (36.9 C), temperature source Oral, resp. rate 18, height 5\' 2"  (1.575 m), weight 108.5 kg, last menstrual period 11/16/2002, SpO2 100 %.  General: No acute distress, sitting in bed Mood and affect are appropriate, pleasant Heart: Regular rate and rhythm no rubs murmurs or extra sounds Lungs: Clear to auscultation, breathing unlabored, no rales or wheezes, good air movement Abdomen: normoactive bowel sounds, soft nontender to palpation, nondistended Extremities: No clubbing, cyanosis, or edema Skin: No evidence of breakdown, no evidence of rash Neuro:  Eyes without evidence of nystagmus  Tone is normal without evidence of spasticity Cerebellar exam - mild sensory ataxia noted BUE, BLE  Motor strength is 5/5 in bilateral deltoid, biceps, triceps, finger flexors and extensors, wrist flexors and extensors, hip flexors, knee flexors and extensors, ankle dorsiflexors, plantar flexors, invertors and evertors, toe flexors and extensors  Sensory exam is normal reduced light touch sensation right hemifacial.  There are paresthesias in bilateral upper and lower limbs with sensory exam  MSK Right shoulder negative impingement has full range of  motion mild endrange pain.  Assessment/Plan: 1. Functional deficits which require 3+ hours per day of interdisciplinary therapy in a comprehensive inpatient rehab setting. Physiatrist is providing close team supervision and 24 hour management of active medical problems listed below. Physiatrist and rehab team continue to assess barriers to discharge/monitor patient progress toward functional and medical goals  Care Tool:  Bathing    Body parts bathed by patient: Right arm, Left arm, Chest, Abdomen, Front perineal area, Buttocks, Right upper leg, Left upper leg, Face, Left lower leg, Right lower leg   Body parts bathed by helper: Left lower leg, Right lower leg     Bathing assist Assist Level: Supervision/Verbal cueing     Upper Body Dressing/Undressing Upper body dressing   What is the patient wearing?: Pull over shirt    Upper body assist Assist Level: Set up assist    Lower Body Dressing/Undressing Lower body dressing      What is the patient wearing?: Pants     Lower body assist Assist for lower body dressing: Supervision/Verbal cueing     Toileting Toileting    Toileting assist Assist for toileting: Independent with assistive device     Transfers Chair/bed transfer  Transfers assist     Chair/bed transfer assist  level: Supervision/Verbal cueing     Locomotion Ambulation   Ambulation assist      Assist level: Contact Guard/Touching assist Assistive device: Walker-rolling Max distance: 165ft+   Walk 10 feet activity   Assist     Assist level: Contact Guard/Touching assist Assistive device: Walker-rolling   Walk 50 feet activity   Assist    Assist level: Contact Guard/Touching assist Assistive device: Walker-rolling    Walk 150 feet activity   Assist    Assist level: Contact Guard/Touching assist Assistive device: Walker-rolling    Walk 10 feet on uneven surface  activity   Assist     Assist level: Contact Guard/Touching  assist Assistive device: Other (comment) (HR)   Wheelchair     Assist Is the patient using a wheelchair?: Yes Type of Wheelchair: Manual    Wheelchair assist level: Minimal Assistance - Patient > 75% Max wheelchair distance: 94ft    Wheelchair 50 feet with 2 turns activity    Assist        Assist Level: Moderate Assistance - Patient 50 - 74%   Wheelchair 150 feet activity     Assist      Assist Level: Total Assistance - Patient < 25%   Blood pressure 136/65, pulse 85, temperature 98.4 F (36.9 C), temperature source Oral, resp. rate 18, height 5\' 2"  (1.575 m), weight 108.5 kg, last menstrual period 11/16/2002, SpO2 100 %.  Medical Problem List and Plan: 1. Functional deficits secondary to left thalamic ICH d/t hypertension with additional subacute infarcts in the left fronto-parietal white matter infarcts d/t small vessel disease             -patient may shower             -ELOS/Goals: 7-11 days, mod I to supervision  -Continue CIR PT/OT  -Ambulated 175 feet with therapy   2.  Antithrombotics: -DVT/anticoagulation:  Mechanical:  Antiembolism stockings, knee (TED hose) Bilateral lower extremities             -antiplatelet therapy:  Consider restarting 81 mg aspirin in 2 weeks after repeat CT head-will order for THursday, expect d/c later in the week    3. Pain Management: Tylenol as needed Right shoulder pain no sign of impingement syndrome likely post stroke.  Will use K-pad   4. Mood/Behavior/Sleep: LCSW to evaluate and provide emotional support             -antipsychotic agents: n/a   5. Neuropsych/cognition: This patient is capable of making decisions on her own behalf.   6. Skin/Wound Care: Routine skin care checks   7. Fluids/Electrolytes/Nutrition: Routine Is and Os and follow-up chemistries   8: Hypertension: monitor TID and prn             -continue Coreg 6.25 mg BID             -continue Norvasc 10 mg daily             -bp currently  borderline. May be related to prednisone  -3/24 increase coreg to 12.5 mg BID, blood pressures are now controlled. Vitals:   02/06/23 1927 02/07/23 0600  BP: 134/61 136/65  Pulse: 86 85  Resp: 16 18  Temp: 98 F (36.7 C) 98.4 F (36.9 C)  SpO2: 98% 100%    9: Hyperlipidemia: continue statin   10: Acute left forefoot gout +/- cellulitis: already appears improved today -continue colchicine -continue prednisone 40 mg daily through 3/21 -Ancef 1 mg q 8 hours through  3/23 but no evidence of cellulitis , gout is being treated will D/C Continue colchicine twice daily   11: GERD/HH/hx of GI bleed, H. pylori: continue Protonix 40 mg daily   12: DM: CBGs q AC and q HS; home med [place] 2 mg q Saturday             -continue carb modified diet;  prednisone elevating cbg's             -per diabetic manager 3/19: change Novolog correction to 0-9 units from 0-15 and consider Novolog 2 units TID meal coverage if eats >50% meal  CBG (last 3)  Recent Labs    02/06/23 1641 02/06/23 2110 02/07/23 0616  GLUCAP 87 132* 93   Improved off prednisone - appears to be running normal CBGs off prednisone will monitor for  a couple more days , consider d/c if running normal values    13: AKI atop CKD stage II (1.3-1.5 SCr baseline). Back to baseline     Latest Ref Rng & Units 02/06/2023    6:31 AM 02/01/2023    5:38 AM 01/31/2023    2:41 AM  BMP  Glucose 70 - 99 mg/dL 99  135  195   BUN 8 - 23 mg/dL 22  33  38   Creatinine 0.44 - 1.00 mg/dL 1.46  1.57  1.98   Sodium 135 - 145 mmol/L 139  137  135   Potassium 3.5 - 5.1 mmol/L 3.2  3.9  4.2   Chloride 98 - 111 mmol/L 106  110  103   CO2 22 - 32 mmol/L 23  21  20    Calcium 8.9 - 10.3 mg/dL 8.4  8.2  8.3       HypoK+ KCL 15meq qam x 2 d, recheck BMET mid week 14: Morbid  obesity: BMI = 41.52  15. Nausea with medications  -Discussed with pharmacy-appreciate assistance ,adjust Crestor and Claritin to HS          LOS: 7 days A FACE TO FACE  EVALUATION WAS PERFORMED  Charlett Blake 02/07/2023, 7:51 AM

## 2023-02-07 NOTE — Progress Notes (Signed)
Verbalized discomfort of soreness to right shoulder, medications offered but refused at this time, warm pack applied to this area and monitor. Patient requesting a heating pad

## 2023-02-07 NOTE — Progress Notes (Signed)
Physical Therapy Session Note  Patient Details  Name: [name] MRN: [id number] Date of Birth: 1956-06-22  Today's Date: 02/07/2023 PT Individual Time: [id number] - 901 PT Individual Time Calculation (min): 58 min PT Individual Time: [id number] PT Individual Time Calculation (min): 49 min   Short Term Goals: Week 1:  PT Short Term Goal 1 (Week 1): Pt will perform transfer w/ CGA PT Short Term Goal 2 (Week 1): Pt will ambulate 150 ft w/ SBA PT Short Term Goal 3 (Week 1): Pt will initiate 6MWT  SESSION 1 Skilled Therapeutic Interventions/Progress Updates: Patient in recliner with B LE elevated and pillow underneath on entrance to room. Patient alert and agreeable to PT session.   Patient had no complaints of pain, except for R UE that was patient reported to be "stiff" this morning per patient. Provided total A to donn B TED hose and B shoes.  Therapeutic Activity: Transfers: Pt performed sit to stand to RW or no AD throughout session with CGA - supervision for safety. Provided verbal cues for move towards edge of recliner.  Gait Training:  Pt ambulated 200'+ ft using RW with CGA - supervision for safety from room to main gym, around main gym and back to patient's room. Provided vc to decrease cadence. Therapist brought WC follow for safety.  Neuromuscular Re-ed: NMR facilitated during session with focus on gait endurance, challenging balance and increased WB on R LE - Patient guided in bean bag toss (#'s varied) from elevated table on R side, and patient instructed to knock down cones on the floor using R UE x 7 tosses (2 sets)    - Patient progressed to bean bag toss with B LE in tandem (L LE infront) x7 tosses (1 set)    - Patient pogressed to patient in tandem (R LE in front), and elevated table with bean bags on L side of patient in order to challenge crossing midline balance x 7  tosses (2 sets)    - Patient required CGA for safety to maintain standing balance, and required  multimodal cues in order to remember sequence. Patient guided in hurdle step overs with orange cones between them on B sides. Patient was instructed to step over cones, and to perform lateral steps on R and L LE to orange cones for external cues. 5 hurdles x 2 laps. Patient required vc to find balance before performing lateral step, and required CGA- light Min A for safety. Patient required increased time during step over in order to motor plan next movement. NMR performed for improvements in motor control and coordination, balance, sequencing, judgement, and self confidence/ efficacy in performing all aspects of mobility at highest level of independence.   Patient left in recliner at end of session with brakes locked, chair alarm set, and all needs within reach.   SESSION 2: Skilled Therapeutic Interventions/Progress Updates: Patient in recliner with K-pad on R UE on entrance to room, B TED hose and shoes donned. Patient alert and agreeable to PT session.   Patient with no pain complaint at start of session, and that R shoulder feels a little better.   Therapeutic Activity: Transfers: Pt performed sit to stand to RW or no AD throughout session with supervision for safety. Patient transferred to commode with therapist outside bathroom door per patient request in order to urinate with no issue.  Gait Training:  Pt ambulated 150' ft using RW to and from main gym from room with CGA - supervision for safety. WC to follow  for safety. Provided verbal cues to decrease cadence. Pt ambulated 60' with out AD in main gym with CGA for safety. Patient required increased time to ambulate in order to maintain dynamic balance.  Patient ascended/descended 6" stairs and required CGA-light min A on descending. Patient demonstrated reciprocal steps while ascending, but required increased time and presented with slight lateral deviated step and step to pattern while descending 2/2 patient report of feeling uneasy about  feet catching/missing a step and falling. 1 lap of 6" stairs was performed.  Neuromuscular Re-ed: NMR facilitated during session with focus on increasing gait endurance, and steps.  - Patient performed cone placement from table at hip height in front, and was instructed to retro-step with R LE and to place cone on correlating color dots that were placed on a table behind patient while maintaining tandem stance. Patient required multimodal cues to navigate sequence, increase retro-step, and CGA- light min A to maintain balance/safety.  - Patient performed standing balance on airex pad x 56min   - Patient performed standing balance with narrow base of support x 1 min     - Patient required CGA for safety, and use of B UE in slight abduction to maintain balance (tandem>regular stance) - Patient performed lateral step ups on aerobic step x 10 with R LE leading; x 10 with L LE leading   - Patient required a rest break, and was provided with CGA A for safety. Multimodal cues required for patient to keep shoulders and hips parallel with wall in front, and to increase lateral step length on aerobic platform. NMR performed for improvements in motor control and coordination, balance, sequencing, judgement, and self confidence/ efficacy in performing all aspects of mobility at highest level of independence.   Patient left in recliner at end of session with B TED hose donned, K-pad donned R UE, brakes locked, belt alarm set, and all needs within reach.        Therapy Documentation Precautions:  Precautions Precautions: Fall Restrictions Weight Bearing Restrictions: No    Therapy/Group: Individual Therapy  Britton Bera, PTA 02/07/2023, 2:26 PM

## 2023-02-07 NOTE — Progress Notes (Signed)
Occupational Therapy Session Note  Patient Details  Name: [name] MRN: [id number] Date of Birth: 06/14/1956  Today's Date: 02/07/2023 OT Individual Time: [id number] session 1 OT Individual Time Calculation (min): 45 min  Session 2: 1352-1452   Short Term Goals: Week 1:  OT Short Term Goal 1 (Week 1): STGs = LTGs  Skilled Therapeutic Interventions/Progress Updates:  Session 1: Pt greeted seated in recliner, pt agreeable to OT intervention. Session focus on BADL reeducation, functional mobility, dynamic standing balance, RUE strength/coordination and decreasing overall caregiver burden.                     Pt completed 2 ambulatory transfers to toilet with RW and supervision, supervision for 3/3 toileting tasks with continent urine void. Total A transport to/from gym for time mgmt and energy conservation.   Pt completed seated therapeutic activities with an emphasis on RUE grip strength/coordination. Pt utilized level 1 hand exerciser to grasp pegs with an emphasis on intrinsic strength, pt completed task with increased time with coband wrapped around exerciser to facilitate improved grasp on exerciser. Pt also able to use level 5 clothespin to grasp small shapes with an emphasis on pincer grasp, pt completed task with supervision with MIN cues for technique, I.e keeping R elbow on table for proximal support. Pt also able to complete RUE precision task with pt screwing/unscrewing various fasteners such as nuts/bolts.   Ended session with pt seated in recliner with all needs within reach.     Session 2: pt greeted seated in recliner, pt agreeable to OT intervention. Discussed various ADL tasks that pt needs to be able to complete at home. Noted that pt with difficulty tying shoe laces, no elastic laces available therefore brought up various options from [employer] and printed them out for pt with intent to buy.   Pt completed ambulatory transfer to toilet with Rw really progressing to  MODI. Pt completed 3/3 toileting tasks MODI, continent bowel void. Pt completed functional ambulation down to apt with pt able to complete simple meal prep of making peanut butter crackers with pt able to reach [employer] into cabinets and below knee level to retrieve items from Rw level with supervision assist overall. Pt with good safety awareness able to initate transporting food items in walker bags with no cues needed. Pt able to simulate putting pan in oven and reaching into refrigerator with supervision and good safety awareness.   Education provided on fall prevention for kitchen tasks as well as energy conservation strategies for kitchen tasks such as placing regularly used items in accessible areas to decrease the need for extensive bending. Pt did have difficulty opening items, discussed compensatory methods such as buying easy open items and using dycem as needed. Pt reports she plans to have grandson assist with items that are difficult to open.  Pt completed functional ambulation back to room with rw and supervision. Ended session with pt supine in bed with all needs within reach.   Therapy Documentation Precautions:  Precautions Precautions: Fall Restrictions Weight Bearing Restrictions: No (improving)    Pain: no pain reported during session     Therapy/Group: Individual Therapy  Corinne Ports [facility] 02/07/2023, 12:14 PM

## 2023-02-08 ENCOUNTER — Inpatient Hospital Stay (HOSPITAL_COMMUNITY): Payer: Medicare Other

## 2023-02-08 LAB — POTASSIUM: Potassium: 3.5 mmol/L (ref 3.5–5.1)

## 2023-02-08 LAB — GLUCOSE, CAPILLARY: Glucose-Capillary: 121 mg/dL — ABNORMAL HIGH (ref 70–99)

## 2023-02-08 MED ORDER — POTASSIUM CHLORIDE CRYS ER 20 MEQ PO TBCR
40.0000 meq | EXTENDED_RELEASE_TABLET | Freq: Every day | ORAL | Status: AC
  Administered 2023-02-08 – 2023-02-09 (×2): 40 meq via ORAL
  Filled 2023-02-08 (×2): qty 2

## 2023-02-08 MED ORDER — ASPIRIN 81 MG PO TBEC
81.0000 mg | DELAYED_RELEASE_TABLET | Freq: Every day | ORAL | Status: DC
  Administered 2023-02-08 – 2023-02-10 (×3): 81 mg via ORAL
  Filled 2023-02-08 (×3): qty 1

## 2023-02-08 NOTE — Progress Notes (Signed)
Patient ID: [name], female   DOB: [birth year], 67 y.o.   MRN: [id number]  Team Conference Report to Patient/Family  Team Conference discussion was reviewed with the patient and caregiver, including goals, any changes in plan of care and target discharge date.  Patient and caregiver express understanding and are in agreement.  The patient has a target discharge date of 02/10/23.  SW met with patient and provided team conference updates. Patient has arranged transportation for Friday. Patient requesting a handicapped placement card application. No additional questions or concerns.    Dyanne Iha 02/08/2023, 1:41 PM

## 2023-02-08 NOTE — Discharge Summary (Signed)
Physician Discharge Summary  Patient ID: [name] MRN: [id number] DOB/AGE: 01-06-1956 21 y.o.  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 RADIATION DOSE REDUCTION: This exam was performed according to the departmental dose-optimization program which includes automated exposure control, adjustment of the mA and/or kV according to patient size and/or use of iterative reconstruction technique. COMPARISON:  01/28/2023 FINDINGS: Brain: Decreased hemorrhage in the left thalamus. No new hemorrhage or acute infarct. Extensive chronic small vessel ischemic injury in the cerebral white matter and deep gray nuclei. Chronic cortex infarct along the right sylvian fissure. Vascular: No hyperdense vessel or unexpected calcification. Skull: Normal. Negative for fracture or focal lesion. Sinuses/Orbits: No acute finding. IMPRESSION: 1. Fading small hemorrhage in the left thalamus. No new abnormality. 2. Advanced chronic small vessel disease. Electronically Signed   By: Jorje Guild M.D.   On: 02/08/2023 07:36   VAS [place] LOWER EXTREMITY VENOUS (DVT)  Result Date: 01/31/2023  Lower Venous DVT Study Patient Name:  [name]  Date of Exam:   01/31/2023 Medical Rec #: [id number]           Accession #:    [id number]  Date of Birth: 14-Jul-1956          Patient Gender: F Patient Age:   24 years Exam Location:  [facility] Procedure:      VAS [place] LOWER EXTREMITY VENOUS (DVT) Referring Phys: Langley Gauss WOLFE --------------------------------------------------------------------------------  Indications: Pain.  Limitations: Body habitus and poor ultrasound/tissue interface. Comparison Study: Previous exam on 08/26/2016 at [facility] was negative for DVT Performing Technologist: Rogelia Rohrer RVT, RDMS  Examination Guidelines: A complete evaluation includes B-mode imaging, spectral Doppler, color Doppler, and power Doppler as needed of all accessible portions of each vessel. Bilateral testing is considered an integral part of a complete examination. Limited examinations for reoccurring indications may be performed as noted. The reflux portion of the exam is performed with the patient in reverse Trendelenburg.  +-----+---------------+---------+-----------+----------+--------------+ RIGHTCompressibilityPhasicitySpontaneityPropertiesThrombus Aging +-----+---------------+---------+-----------+----------+--------------+ CFV  Full           Yes      Yes                                 +-----+---------------+---------+-----------+----------+--------------+   +---------+---------------+---------+-----------+----------+--------------+ LEFT     CompressibilityPhasicitySpontaneityPropertiesThrombus Aging +---------+---------------+---------+-----------+----------+--------------+ CFV      Full           Yes      Yes                                 +---------+---------------+---------+-----------+----------+--------------+ SFJ      Full                                                        +---------+---------------+---------+-----------+----------+--------------+  FV Prox  Full           Yes      Yes                                 +---------+---------------+---------+-----------+----------+--------------+ FV  Mid   Full           Yes      Yes                                 +---------+---------------+---------+-----------+----------+--------------+ FV DistalFull           Yes      Yes                                 +---------+---------------+---------+-----------+----------+--------------+ PFV      Full                                                        +---------+---------------+---------+-----------+----------+--------------+ POP      Full           Yes      Yes                                 +---------+---------------+---------+-----------+----------+--------------+ PTV      Full                                                        +---------+---------------+---------+-----------+----------+--------------+ PERO     Full                                                        +---------+---------------+---------+-----------+----------+--------------+    Summary: RIGHT: - No evidence of common femoral vein obstruction.  LEFT: - There is no evidence of deep vein thrombosis in the lower extremity.  - No cystic structure found in the popliteal fossa.  *See table(s) above for measurements and observations. Electronically signed by Deitra Mayo MD on 01/31/2023 at 4:41:56 PM.    Final     Labs:  Basic Metabolic Panel: Recent Labs  Lab 02/06/23 0631 02/08/23 0625  NA 139  --   K 3.2* 3.5  CL 106  --   CO2 23  --   GLUCOSE 99  --   BUN 22  --   CREATININE 1.46*  --   CALCIUM 8.4*  --     CBC: Recent Labs  Lab 02/06/23 0631  WBC 6.3  HGB 13.0  HCT 41.6  MCV 89.1  PLT 248    CBG: Recent Labs  Lab 02/07/23 0616 02/07/23 1155 02/07/23 1700 02/07/23 2027 02/08/23 0555  GLUCAP 93 85 109* 133* 121*    Brief HPI:   [name] is a 67 y.o. female who presented to the ED  on 01/27/2023 complaining of tingling and numbness of her right side similar to symptoms she had from previous stroke. History of hypertension on HCTZ and lisinopril.  CT head consistent with thalamic intraparenchymal hemorrhage. Neurology consulted. Systolic BP range in 123456. Admitted for BP control and further work-up. 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 AKI with serum creatinine to 2.10. IVFs NS at 75 cc/hr continues. A1c 7.1%. Inpatient diabetic coordinator consulted. She develop acute right foot pain and orthopedic surgery was consulted. Palin films obtained. Started on prednisone and colchicine for acute gout. Added Ancef for possible cellulitis. Light touch diminished in left arm. No antithrombotic prior to admission, now on no antithrombotic secondary to IPH.  Consider restarting 81 mg aspirin in 2 weeks after repeat CT head. Now on amlodipine and carvedilol.    Hospital Course: [name] was admitted to rehab 01/31/2023 for inpatient therapies to consist of PT, ST and OT at least three hours five days a week. Past admission physiatrist, therapy team and rehab RN have worked together to provide customized collaborative inpatient rehab.  Antibiotics discontinued for possible left foot/ankle cellulitis as she was responding to treatment for acute gout.  Improved BUN and serum creatinine on 3/20.  Overnight IV fluids continued>> continued on 3/22.  She complained of persistent nausea without vomiting and adjusted Crestor and Claritin dosing to bedtime.  Follow-up labs on 3/25 with normalization of BUN and further decrease of serum creatinine to 1.46.  Serum potassium 3.2 and given 40 mEq every morning for 2 days.  Underwent reimaging with CT head on 3/26 and repeat results reviewed by Dr. Leonie Man. She was started on aspirin 81 mg daily 3/27.   Blood pressures were monitored on TID basis and Coreg 6.25 mg twice daily, Norvasc 10 mg daily continued.  Increased Coreg to 12.5 mg twice daily on 3/24 due to elevated blood pressure.  Diabetes has been monitored with ac/hs CBG checks and SSI was  use prn for tighter BS control.  Monitoring was continued several days after completing prednisone therapy and discontinued on 3/27.   Rehab course: During patient's stay in rehab weekly team conferences were held to monitor patient's progress, set goals and discuss barriers to discharge. At admission, patient required  min with mobility and with basic self-care skills.  She  has had improvement in activity tolerance, balance, postural control as well as ability to compensate for deficits. She has had improvement in functional use RUE/LUE  and RLE/LLE as well as improvement in awareness.  She ambulated to the therapy gym modified independent.  Perform sit to stand transfers rolling walker without assistive device.  Navigate stairs bilateral handrails with supervision.  She can ambulate up to 200 feet using rolling walker.  Gather his belongings for activities daily when homemaking.  Full family teaching completed plan discharge to home       Disposition: Discharge to home    Diet: Carb modified  Special Instructions: No driving smoking or alcohol  Medications at discharge 1.  Tylenol as needed 2.  Norvasc 10 mg p.o. daily 3.  Aspirin 81 mg p.o. daily 4.  Coreg 12.5 mg p.o. twice daily 5.  Colchicine 0.6 mg p.o. daily 7.  Claritin 10 mg p.o. nightly 8.  Robaxin 500 mg every 6 hours as needed muscle spasms 9.  Protonix 40 mg p.o. daily 10.  Crestor 20 mg p.o. nightly  30-35 minutes were spent completing discharge summary and discharge planning  Follow-up Information     Bernerd Limbo, MD Follow up.   Specialty: Family Medicine Why: Call the office in 1-2 days to make arrangements for hospital follow-up appointment. Contact information: [place] [phone]         Charlett Blake, MD Follow up.   Specialty: Physical Medicine and Rehabilitation Why: office will call you to arrange your appt (sent) Contact  information: [place] [address] [phone]         [facility] Follow up.   Why: Call the office in 1-2 days to make arrangements for hospital follow-up appointment. Contact information: [address]     [place] [phone]                Signed: Barbie Banner 02/08/2023, 12:48 PM

## 2023-02-08 NOTE — Progress Notes (Signed)
Physical Therapy Session Note  Patient Details  Name: [name] MRN: [id number] Date of Birth: [birth year]  Today's Date: 02/08/2023 PT Individual Time: [id number] PT Individual Time Calculation (min): 68 min   Short Term Goals: Week 1:  PT Short Term Goal 1 (Week 1): Pt will perform transfer w/ CGA PT Short Term Goal 2 (Week 1): Pt will ambulate 150 ft w/ SBA PT Short Term Goal 3 (Week 1): Pt will initiate 6MWT  Skilled Therapeutic Interventions/Progress Updates: Pt presented in recliner agreeable to therapy. Pt denies pain but endorses significant fatigue. Pt performed ambulatory transfer without AD and with light CGA to w/c. Pt transported to day room for energy conservation and time management. In day room pt participated in balance and gait activities. At [facility] participated in 4 square activities CW/CCW with CGA. Pt required HHA when stepping posteriorly due to decreased RLE stability however no overt LOB. Pt also participated balance tasks including cornhole with LLE on 4in block and peg activities while standing on Airex with moderate complex challenges incorporating use of BUE. Pt required intermittent rest breaks due to fatigue however did not display decreased safety awareness nor LOB with activity. Pt also participated in ambulation forward/backward without AD for increased balance challenge. Pt politely refused at the time due to fatigue however discussed at length floor transfer. Pt was able to verbalize to PTA how to maneuver floor transfer and to call 911 if needed. After discussion pt ambulated back to room without AD and CGA. Pt required cues for pacing, as noted increased lateral leans with turns. Upon return to room pt requesting to use bathroom. Performed ambulatory transfer and performed toilet transfers with supervision (continent urinary void). Pt returned to recliner at end of session and left with seat alarm on, call bell within reach and needs met.      Therapy  Documentation Precautions:  Precautions Precautions: Fall Restrictions Weight Bearing Restrictions: No General:   Vital Signs: Therapy Vitals Temp: 98.6 F (37 C) Temp Source: Oral Pulse Rate: 72 Resp: 18 BP: (!) 144/69 Patient Position (if appropriate): Sitting Oxygen Therapy SpO2: 98 % O2 Device: Room Air    Therapy/Group: Individual Therapy  Seyon Strader 02/08/2023, 2:42 PM

## 2023-02-08 NOTE — Progress Notes (Signed)
Physical Therapy Session Note  Patient Details  Name: [name] MRN: [id number] Date of Birth: April 18, 1956  Today's Date: 02/08/2023 PT Individual Time: [id number] PT Individual Time Calculation (min): 58 min   Short Term Goals: Week 1:  PT Short Term Goal 1 (Week 1): Pt will perform transfer w/ CGA PT Short Term Goal 2 (Week 1): Pt will ambulate 150 ft w/ SBA PT Short Term Goal 3 (Week 1): Pt will initiate 6MWT  Skilled Therapeutic Interventions/Progress Updates: Patient in recliner on entrance to room. Patient alert and agreeable to PT session.   Patient with no pain complaint at start of session, and that R UE is feeling better per patient report of receiving STM in previous therapy treatment. Review of LTG performed this session 2/2 d/c update to 02/10/23.  Therapeutic Activity: Transfers: Pt performed sit to stand transfers to RW or no AD throughout session with supervision for safety. Patient performed car transfer from RW with supervision and demonstrated ability to do so safely.  - Sitting dynamic balance challenged with patient performing ball tosses to therapist on edge of hi/low mat and was able to maintain balance as therapist tossed ball in various directions. Patient demonstrated ability to adjust trunk in order to maintain sitting balance. - Dynamic standing balance challenged as patient was cued to toss bean bags towards cones in various locations in main gym with use of RW and supervision. Patient able to perform intervention without LOB. Vc provided to remain close to RW.  - Patient ambulated on non-compliant surface in order to simulate patient's carpet at home with supervision for safety. Patient demonstrated ability to navigate RW onto elevated blue mat on floor safely. Vc provided to kee RW in close proximity when lifting it over edge.  - Patient navigated 4 (6") stairs with use of B hand rails, reciprocal step pattern during ascension, and slight turn to  patient's left side per patient report that "it feels safer," and use of B hand rails. - Patient ambulated 24' without AD and close supervision for safety. Patient presented with slight unsteadiness vs with RW.    Gait Training:  - Pt ambulated 200'+ ft using RW from room to ortho gym, main gym and back to room with supervision for safety. Demonstrated RW management while ambulating in busy hallway and gym. Provided vc decrease cadence and to remain in proper proximity of RW.  Patient ambulated back to room from main gym and was left in recliner at end of session with brakes locked, belt alarm set, and all needs within reach.      Therapy Documentation Precautions:  Precautions Precautions: Fall Restrictions Weight Bearing Restrictions: No   Therapy/Group: Individual Therapy  Takari Lundahl, PTA 02/08/2023, 4:30 PM

## 2023-02-08 NOTE — Patient Care Conference (Signed)
Inpatient RehabilitationTeam Conference and Plan of Care Update Date: 02/08/2023   Time: 10:09 AM    Patient Name: [name]      Medical Record Number: [id number]  Date of Birth: 1956-01-20 Sex: Female         Room/Bed: 4M04C/4M04C-01 Payor Info: Payor: Theme park manager MEDICARE / Plan: [facility] MEDICARE / Product Type: *No Product type* /    Admit Date/Time:  01/31/2023  3:42 PM  Primary Diagnosis:  ICH (intracerebral hemorrhage) [facility])  Hospital Problems: Principal Problem:   ICH (intracerebral hemorrhage) ([facility])    Expected Discharge Date: Expected Discharge Date: 02/10/23  Team Members Present: Social Worker Present: Erlene Quan, BSW Nurse Present: Dorien Chihuahua, RN PT Present: Page Spiro, PT OT Present: Other (comment) Lowella Fairy, OT)     Current Status/Progress Goal Weekly Team Focus  Bowel/Bladder   Pt is continent of b/b. LBM: 02/07/23   Pt will maintain continence.   Assist w/ toileting needs q 2-4 hours q shift & as needed.    Swallow/Nutrition/ Hydration               ADL's   supevision overall for ADLs and functional mobility with RW, RUE sensation continues to be impaired but overall compensating well   MODI   BADL reeducation, functional mobility, RUE NMR, family ed    Mobility   No complaints of LE pain (R shoulder has been feeling more "stiff"). Standing transfers and RW gait (200'+) = Supervision; Gait no AD (60') = CGA with increased time.   Supervision overall  Family education, NMR, Standing balance    Communication                Safety/Cognition/ Behavioral Observations               Pain   Pt denies pain.   Remain pain free.   Assess pain q shift & PRN.    Skin   Skin is intact.   Maintain skin integrity.  Assess skin q shift & PRN.      Discharge Planning:  Discharging home with grandson who fors 2p-11-. (Supervision nights only). Level entry home. Patient has RW.   Team Discussion: Patient post  right thalamic infarct; CT cleared for restart ASA per MD. Hypokalemia improved but limited by right shoulder stiffness. Patient on target to meet rehab goals: yes, currently supervision overall; just needs extra time to complete activities. Goals for discharge set for supervision overall.  *See Care Plan and progress notes for long and short-term goals.   Revisions to Treatment Plan:  N/a  Teaching Needs: Safety, medications, dietary modification recommendations, transfers, toileting, etc.  Current Barriers to Discharge: Decreased caregiver support  Possible Resolutions to Barriers: Family education     Medical Summary Current Status: gout flare resolved, Right shoulder pain , repeat CT head showing resolveing R THalamic ICH     Possible Resolutions to [employer] Focus: Start ASA and monitor for signs of neuro decline   Continued Need for Acute Rehabilitation Level of Care: The patient requires daily medical management by a physician with specialized training in physical medicine and rehabilitation for the following reasons: Direction of a multidisciplinary physical rehabilitation program to maximize functional independence : Yes Medical management of patient stability for increased activity during participation in an intensive rehabilitation regime.: Yes Analysis of laboratory values and/or radiology reports with any subsequent need for medication adjustment and/or medical intervention. : Yes   I attest that I was present, lead the team conference, and  concur with the assessment and plan of the team.   Margarito Liner 02/08/2023, 2:21 PM

## 2023-02-08 NOTE — Progress Notes (Addendum)
Occupational Therapy Session Note  Patient Details  Name: [name] MRN: [id number] Date of Birth: 06/08/1956  Today's Date: 02/08/2023 OT Individual Time: [id number] OT Individual Time Calculation (min): 75 min    Short Term Goals: Week 1:  OT Short Term Goal 1 (Week 1): STGs = LTGs  Skilled Therapeutic Interventions/Progress Updates:    Pt received in recliner. She stated she had sponged off earlier this am and was able to get her clothing on. Pt wearing her bare feet in slides.  Asked pt to practice donning and doffing non slip socks as she had previously had difficultly donning R sock.  Pt moved to sit on bed and was able to don and doff socks without A by propping one leg onto the bed.  A to start TEDs over feet and then pt pulled them up herself.  Pt donned sneakers and tied them without A.   She then ambulated with RW to bathroom and used the bathroom with mod I.  Pt ambulated to walk to sink to brush teeth. She was then able to ambulate with RW with supervision close to 300 feet to day room area.  She walked into laundry room and practiced retrieve a sock out of back part of washing machine (had to get on tip toes) and out of back of dryer (using a low squat) without A. Cued pt how to use 1 hand for support as she is squatting.     She then walked to day room to stand at table to practice folding towels using B hands effectively.   Had pt place towels in basket and she practiced walking carrying basket in B hands about 20 feet with S.  Pt did well without RW but also showed her how she can prop basket on her walker.  Pt then ambulated to second gym with RW to work on dynamic standing balance with weight shifting and rotating his torso.  Provided MFR to R shoulder for pain relief and had pt practice using the theracane so she can do self massage.  Informed pt where she can buy that type of device.   She walked back to her room to sit in recliner to rest.  Pt in room with all needs  met.     Therapy Documentation Precautions:  Precautions Precautions: Fall Restrictions Weight Bearing Restrictions: No  Pain:  Minimal c/o pain in back side of shoulder blade ADL: ADL Eating: Independent Grooming: Independent Where Assessed-Grooming: Standing at sink Upper Body Bathing: Modified independent Lower Body Bathing: Setup Upper Body Dressing: Independent Lower Body Dressing: Modified independent Toileting: Modified independent Toilet Transfer: Close supervision Toilet Transfer Method: Counselling psychologist: Emergency planning/management officer Transfer: Curator Method: Heritage manager: Radio broadcast assistant, Grab bars   Therapy/Group: Individual Therapy  Taira Knabe 02/08/2023, 12:46 PM

## 2023-02-08 NOTE — Progress Notes (Addendum)
PROGRESS NOTE   Subjective/Complaints:  CT head results reviewed by Neuro , Dr Tressa Busman to start Baby ASA  K+ improving   LBM today  ROS- neg CP, SOB,N/V/D, + nasal congestion Nausea-improved  Objective:   CT HEAD WO CONTRAST (5MM)  Result Date: 02/08/2023 CLINICAL DATA:  Stroke follow-up EXAM: CT HEAD WITHOUT CONTRAST TECHNIQUE: Contiguous axial images were obtained from the base of the skull through the vertex without intravenous contrast. RADIATION DOSE REDUCTION: This exam was performed according to the departmental dose-optimization program which includes automated exposure control, adjustment of the mA and/or kV according to patient size and/or use of iterative reconstruction technique. COMPARISON:  01/28/2023 FINDINGS: Brain: Decreased hemorrhage in the left thalamus. No new hemorrhage or acute infarct. Extensive chronic small vessel ischemic injury in the cerebral white matter and deep gray nuclei. Chronic cortex infarct along the right sylvian fissure. Vascular: No hyperdense vessel or unexpected calcification. Skull: Normal. Negative for fracture or focal lesion. Sinuses/Orbits: No acute finding. IMPRESSION: 1. Fading small hemorrhage in the left thalamus. No new abnormality. 2. Advanced chronic small vessel disease. Electronically Signed   By: Jorje Guild M.D.   On: 02/08/2023 07:36   Recent Labs    02/06/23 0631  WBC 6.3  HGB 13.0  HCT 41.6  PLT 248    Recent Labs    02/06/23 0631 02/08/23 0625  NA 139  --   K 3.2* 3.5  CL 106  --   CO2 23  --   GLUCOSE 99  --   BUN 22  --   CREATININE 1.46*  --   CALCIUM 8.4*  --      Intake/Output Summary (Last 24 hours) at 02/08/2023 0842 Last data filed at 02/08/2023 0818 Gross per 24 hour  Intake 360 ml  Output --  Net 360 ml         Physical Exam: Vital Signs Blood pressure (!) 158/79, pulse 96, temperature 98.4 F (36.9 C), resp. rate 18, height 5'  2" (1.575 m), weight 108.5 kg, last menstrual period 11/16/2002, SpO2 99 %.  General: No acute distress, sitting in bed Mood and affect are appropriate, pleasant Heart: Regular rate and rhythm no rubs murmurs or extra sounds Lungs: Clear to auscultation, breathing unlabored, no rales or wheezes, good air movement Abdomen: normoactive bowel sounds, soft nontender to palpation, nondistended Extremities: No clubbing, cyanosis, or edema Skin: No evidence of breakdown, no evidence of rash Neuro:  Eyes without evidence of nystagmus  Tone is normal without evidence of spasticity Cerebellar exam - mild sensory ataxia noted BUE, BLE  Motor strength is 5/5 in bilateral deltoid, biceps, triceps, finger flexors and extensors, wrist flexors and extensors, hip flexors, knee flexors and extensors, ankle dorsiflexors, plantar flexors, invertors and evertors, toe flexors and extensors  Sensory exam is normal reduced light touch sensation right hemifacial.  There are paresthesias in bilateral upper and lower limbs with sensory exam  MSK Right shoulder negative impingement has full range of motion mild endrange pain.  Assessment/Plan: 1. Functional deficits which require 3+ hours per day of interdisciplinary therapy in a comprehensive inpatient rehab setting. Physiatrist is providing close team supervision and 24 hour management  of active medical problems listed below. Physiatrist and rehab team continue to assess barriers to discharge/monitor patient progress toward functional and medical goals  Care Tool:  Bathing    Body parts bathed by patient: Right arm, Left arm, Chest, Abdomen, Front perineal area, Buttocks, Right upper leg, Left upper leg, Face, Left lower leg, Right lower leg   Body parts bathed by helper: Left lower leg, Right lower leg     Bathing assist Assist Level: Supervision/Verbal cueing     Upper Body Dressing/Undressing Upper body dressing   What is the patient wearing?: Pull  over shirt    Upper body assist Assist Level: Set up assist    Lower Body Dressing/Undressing Lower body dressing      What is the patient wearing?: Pants     Lower body assist Assist for lower body dressing: Supervision/Verbal cueing     Toileting Toileting    Toileting assist Assist for toileting: Independent with assistive device     Transfers Chair/bed transfer  Transfers assist     Chair/bed transfer assist level: Supervision/Verbal cueing     Locomotion Ambulation   Ambulation assist      Assist level: Contact Guard/Touching assist Assistive device: Walker-rolling Max distance: 157ft+   Walk 10 feet activity   Assist     Assist level: Contact Guard/Touching assist Assistive device: Walker-rolling   Walk 50 feet activity   Assist    Assist level: Contact Guard/Touching assist Assistive device: Walker-rolling    Walk 150 feet activity   Assist    Assist level: Contact Guard/Touching assist Assistive device: Walker-rolling    Walk 10 feet on uneven surface  activity   Assist     Assist level: Contact Guard/Touching assist Assistive device: Other (comment) (HR)   Wheelchair     Assist Is the patient using a wheelchair?: Yes Type of Wheelchair: Manual    Wheelchair assist level: Minimal Assistance - Patient > 75% Max wheelchair distance: 66ft    Wheelchair 50 feet with 2 turns activity    Assist        Assist Level: Moderate Assistance - Patient 50 - 74%   Wheelchair 150 feet activity     Assist      Assist Level: Total Assistance - Patient < 25%   Blood pressure (!) 158/79, pulse 96, temperature 98.4 F (36.9 C), resp. rate 18, height 5\' 2"  (1.575 m), weight 108.5 kg, last menstrual period 11/16/2002, SpO2 99 %.  Medical Problem List and Plan: 1. Functional deficits secondary to left thalamic ICH d/t hypertension with additional subacute infarcts in the left fronto-parietal white matter infarcts  d/t small vessel disease             -patient may shower             -ELOS/Goals: 3/29  -Continue CIR PT/OT  -Ambulated 175 feet with therapy   2.  Antithrombotics: -DVT/anticoagulation:  Mechanical:  Antiembolism stockings, knee (TED hose) Bilateral lower extremities             -antiplatelet therapy: restarting 81 mg aspirin monitor    3. Pain Management: Tylenol as needed Right shoulder pain no sign of impingement syndrome likely post stroke.  Will use K-pad   4. Mood/Behavior/Sleep: LCSW to evaluate and provide emotional support             -antipsychotic agents: n/a   5. Neuropsych/cognition: This patient is capable of making decisions on her own behalf.   6. Skin/Wound Care: Routine  skin care checks   7. Fluids/Electrolytes/Nutrition: Routine Is and Os and follow-up chemistries   8: Hypertension: monitor TID and prn             -continue Coreg 6.25 mg BID             -continue Norvasc 10 mg daily             -bp currently borderline. May be related to prednisone  -3/24 increase coreg to 12.5 mg BID, blood pressures improving but some lability , HR mildly elevated consider increase Coreg if this persists Vitals:   02/07/23 1847 02/08/23 0252  BP: 133/73 (!) 158/79  Pulse: 92 96  Resp: 17 18  Temp: 98.2 F (36.8 C) 98.4 F (36.9 C)  SpO2: 100% 99%    9: Hyperlipidemia: continue statin   10: Acute left forefoot gout +/- cellulitis: already appears improved today -continue colchicine -continue prednisone 40 mg daily through 3/21 -Ancef 1 mg q 8 hours through 3/23 but no evidence of cellulitis , gout is being treated will D/C Continue colchicine twice daily   11: GERD/HH/hx of GI bleed, H. pylori: continue Protonix 40 mg daily   12: DM: CBGs q AC and q HS; home med [place] 2 mg q Saturday             -continue carb modified diet;  prednisone elevating cbg's             -per diabetic manager 3/19: change Novolog correction to 0-9 units from 0-15 and consider Novolog 2  units TID meal coverage if eats >50% meal  CBG (last 3)  Recent Labs    02/07/23 1700 02/07/23 2027 02/08/23 0555  GLUCAP 109* 133* 121*   Improved off prednisone - appears to be running normal CBGs off prednisone will monitor for  a couple more days , consider d/c if running normal values  D/C CBG    13: AKI atop CKD stage II (1.3-1.5 SCr baseline). Back to baseline     Latest Ref Rng & Units 02/08/2023    6:25 AM 02/06/2023    6:31 AM 02/01/2023    5:38 AM  BMP  Glucose 70 - 99 mg/dL  99  135   BUN 8 - 23 mg/dL  22  33   Creatinine 0.44 - 1.00 mg/dL  1.46  1.57   Sodium 135 - 145 mmol/L  139  137   Potassium 3.5 - 5.1 mmol/L 3.5  3.2  3.9   Chloride 98 - 111 mmol/L  106  110   CO2 22 - 32 mmol/L  23  21   Calcium 8.9 - 10.3 mg/dL  8.4  8.2       HypoK+ KCL 12meq qam x 2 d, recheck BMET mid week, still on low side so will cont daily  KCL 43meq qam x 2 more days re check BMET on Friday  14: Morbid  obesity: BMI = 41.52  15. Nausea with medications  -Discussed with pharmacy-appreciate assistance ,adjust Crestor and Claritin to HS          LOS: 8 days A FACE TO FACE EVALUATION WAS PERFORMED  Charlett Blake 02/08/2023, 8:42 AM

## 2023-02-09 MED ORDER — FLUTICASONE PROPIONATE 50 MCG/ACT NA SUSP
1.0000 | Freq: Every day | NASAL | Status: DC
  Administered 2023-02-09: 1 via NASAL
  Filled 2023-02-09: qty 16

## 2023-02-09 NOTE — Progress Notes (Signed)
Occupational Therapy Session Note  Patient Details  Name: Shasmeen Loeffel MRN: [id number] Date of Birth: 07/13/1956  Today's Date: 02/09/2023 OT Individual Time: 1045-1200 OT Individual Time Calculation (min): 75 min    Short Term Goals: Week 1:  OT Short Term Goal 1 (Week 1): STGs = LTGs  Skilled Therapeutic Interventions/Progress Updates:    Pt received in recliner fully dressed and ready for the day. She declined a shower at this time.  She stated she donned her TED hose herself today.  Pt used RW to ambulate to toilet, toilet and then to sink to wash hands with no A. Ambulated to tub room to practice stepping over tub wall with grab bars with no A or cues needed.   Ambulated to day room to engage in stereognosis testing. In R hand she can feel a large 1 inch cube but not smaller objects like a screw.  Worked on [facility] with craft activity of making a small bunny out of a washcloth, stringing beads and putting small objects together.   Pt ambulated back to room. Pt is now mod I in her room.  Pt states she is ready for discharge tomorrow.    Therapy Documentation Precautions:  Precautions Precautions: Fall Restrictions Weight Bearing Restrictions: No   Pain:  No c/o pain     Therapy/Group: Individual Therapy  Jaan Fischel 02/09/2023, 8:31 AM

## 2023-02-09 NOTE — Plan of Care (Signed)
  Problem: RH Balance Goal: LTG Patient will maintain dynamic standing with ADLs (OT) Description: LTG:  Patient will maintain dynamic standing balance with assist during activities of daily living (OT)  Outcome: Completed/Met   Problem: Sit to Stand Goal: LTG:  Patient will perform sit to stand in prep for activites of daily living with assistance level (OT) Description: LTG:  Patient will perform sit to stand in prep for activites of daily living with assistance level (OT) Outcome: Completed/Met   Problem: RH Bathing Goal: LTG Patient will bathe all body parts with assist levels (OT) Description: LTG: Patient will bathe all body parts with assist levels (OT) Outcome: Completed/Met   Problem: RH Dressing Goal: LTG Patient will perform upper body dressing (OT) Description: LTG Patient will perform upper body dressing with assist, with/without cues (OT). Outcome: Completed/Met Goal: LTG Patient will perform lower body dressing w/assist (OT) Description: LTG: Patient will perform lower body dressing with assist, with/without cues in positioning using equipment (OT) Outcome: Completed/Met   Problem: RH Toileting Goal: LTG Patient will perform toileting task (3/3 steps) with assistance level (OT) Description: LTG: Patient will perform toileting task (3/3 steps) with assistance level (OT)  Outcome: Completed/Met   Problem: RH Functional Use of Upper Extremity Goal: LTG Patient will use RT/LT upper extremity as a (OT) Description: LTG: Patient will use right/left upper extremity as a stabilizer/gross assist/diminished/nondominant/dominant level with assist, with/without cues during functional activity (OT) Outcome: Completed/Met   Problem: RH Simple Meal Prep Goal: LTG Patient will perform simple meal prep w/assist (OT) Description: LTG: Patient will perform simple meal prep with assistance, with/without cues (OT). Outcome: Completed/Met   Problem: RH Light Housekeeping Goal: LTG  Patient will perform light housekeeping w/assist (OT) Description: LTG: Patient will perform light housekeeping with assistance, with/without cues (OT). Outcome: Completed/Met   Problem: RH Toilet Transfers Goal: LTG Patient will perform toilet transfers w/assist (OT) Description: LTG: Patient will perform toilet transfers with assist, with/without cues using equipment (OT) Outcome: Completed/Met   Problem: RH Tub/Shower Transfers Goal: LTG Patient will perform tub/shower transfers w/assist (OT) Description: LTG: Patient will perform tub/shower transfers with assist, with/without cues using equipment (OT) Outcome: Completed/Met

## 2023-02-09 NOTE — Progress Notes (Signed)
PROGRESS NOTE   Subjective/Complaints:  No new c/o except allergy symptoms, runny nose , watering eyes  Amb from room to PT gym  ROS- neg CP, SOB,N/V/D, + nasal congestion Nausea-improved  Objective:   CT HEAD WO CONTRAST (5MM)  Result Date: 02/08/2023 CLINICAL DATA:  Stroke follow-up EXAM: CT HEAD WITHOUT CONTRAST TECHNIQUE: Contiguous axial images were obtained from the base of the skull through the vertex without intravenous contrast. RADIATION DOSE REDUCTION: This exam was performed according to the departmental dose-optimization program which includes automated exposure control, adjustment of the mA and/or kV according to patient size and/or use of iterative reconstruction technique. COMPARISON:  01/28/2023 FINDINGS: Brain: Decreased hemorrhage in the left thalamus. No new hemorrhage or acute infarct. Extensive chronic small vessel ischemic injury in the cerebral white matter and deep gray nuclei. Chronic cortex infarct along the right sylvian fissure. Vascular: No hyperdense vessel or unexpected calcification. Skull: Normal. Negative for fracture or focal lesion. Sinuses/Orbits: No acute finding. IMPRESSION: 1. Fading small hemorrhage in the left thalamus. No new abnormality. 2. Advanced chronic small vessel disease. Electronically Signed   By: Jorje Guild M.D.   On: 02/08/2023 07:36   No results for input(s): "WBC", "HGB", "HCT", "PLT" in the last 72 hours.  Recent Labs    02/08/23 0625  K 3.5     Intake/Output Summary (Last 24 hours) at 02/09/2023 0852 Last data filed at 02/09/2023 0813 Gross per 24 hour  Intake 777 ml  Output --  Net 777 ml         Physical Exam: Vital Signs Blood pressure 123/73, pulse 93, temperature 98.5 F (36.9 C), temperature source Oral, resp. rate 18, height 5\' 2"  (1.575 m), weight 108.5 kg, last menstrual period 11/16/2002, SpO2 95 %.  General: No acute distress, sitting in  bed Mood and affect are appropriate, pleasant Heart: Regular rate and rhythm no rubs murmurs or extra sounds Lungs: Clear to auscultation, breathing unlabored, no rales or wheezes, good air movement Abdomen: normoactive bowel sounds, soft nontender to palpation, nondistended Extremities: No clubbing, cyanosis, or edema Skin: No evidence of breakdown, no evidence of rash Neuro:  Eyes without evidence of nystagmus  Tone is normal without evidence of spasticity Cerebellar exam - mild sensory ataxia noted BUE, BLE  Motor strength is 5/5 in bilateral deltoid, biceps, triceps, finger flexors and extensors, wrist flexors and extensors, hip flexors, knee flexors and extensors, ankle dorsiflexors, plantar flexors, invertors and evertors, toe flexors and extensors  Sensory exam is normal reduced light touch sensation right hemifacial.  There are paresthesias in bilateral upper and lower limbs with sensory exam  MSK Right shoulder negative impingement has full range of motion mild endrange pain.  Assessment/Plan: 1. Functional deficits which require 3+ hours per day of interdisciplinary therapy in a comprehensive inpatient rehab setting. Physiatrist is providing close team supervision and 24 hour management of active medical problems listed below. Physiatrist and rehab team continue to assess barriers to discharge/monitor patient progress toward functional and medical goals  Care Tool:  Bathing    Body parts bathed by patient: Right arm, Left arm, Chest, Abdomen, Front perineal area, Buttocks, Right upper leg, Left upper leg, Face, Left  lower leg, Right lower leg   Body parts bathed by helper: Left lower leg, Right lower leg     Bathing assist Assist Level: Supervision/Verbal cueing     Upper Body Dressing/Undressing Upper body dressing   What is the patient wearing?: Pull over shirt    Upper body assist Assist Level: Set up assist    Lower Body Dressing/Undressing Lower body  dressing      What is the patient wearing?: Pants     Lower body assist Assist for lower body dressing: Supervision/Verbal cueing     Toileting Toileting    Toileting assist Assist for toileting: Independent with assistive device     Transfers Chair/bed transfer  Transfers assist     Chair/bed transfer assist level: Supervision/Verbal cueing     Locomotion Ambulation   Ambulation assist      Assist level: Contact Guard/Touching assist Assistive device: Walker-rolling Max distance: 128ft+   Walk 10 feet activity   Assist     Assist level: Contact Guard/Touching assist Assistive device: Walker-rolling   Walk 50 feet activity   Assist    Assist level: Contact Guard/Touching assist Assistive device: Walker-rolling    Walk 150 feet activity   Assist    Assist level: Contact Guard/Touching assist Assistive device: Walker-rolling    Walk 10 feet on uneven surface  activity   Assist     Assist level: Contact Guard/Touching assist Assistive device: Other (comment) (HR)   Wheelchair     Assist Is the patient using a wheelchair?: Yes Type of Wheelchair: Manual    Wheelchair assist level: Minimal Assistance - Patient > 75% Max wheelchair distance: 71ft    Wheelchair 50 feet with 2 turns activity    Assist        Assist Level: Moderate Assistance - Patient 50 - 74%   Wheelchair 150 feet activity     Assist      Assist Level: Total Assistance - Patient < 25%   Blood pressure 123/73, pulse 93, temperature 98.5 F (36.9 C), temperature source Oral, resp. rate 18, height 5\' 2"  (1.575 m), weight 108.5 kg, last menstrual period 11/16/2002, SpO2 95 %.  Medical Problem List and Plan: 1. Functional deficits secondary to left thalamic ICH d/t hypertension with additional subacute infarcts in the left fronto-parietal white matter infarcts d/t small vessel disease             -patient may shower             -ELOS/Goals:  3/29  -Continue CIR PT/OT  -Ambulated 175 feet with therapy   2.  Antithrombotics: -DVT/anticoagulation:  Mechanical:  Antiembolism stockings, knee (TED hose) Bilateral lower extremities             -antiplatelet therapy: restarting 81 mg aspirin monitor    3. Pain Management: Tylenol as needed Right shoulder pain no sign of impingement syndrome likely post stroke.  Will use K-pad   4. Mood/Behavior/Sleep: LCSW to evaluate and provide emotional support             -antipsychotic agents: n/a   5. Neuropsych/cognition: This patient is capable of making decisions on her own behalf.   6. Skin/Wound Care: Routine skin care checks   7. Fluids/Electrolytes/Nutrition: Routine Is and Os and follow-up chemistries   8: Hypertension: monitor TID and prn             -continue Coreg 6.25 mg BID             -continue  Norvasc 10 mg daily             -bp currently borderline. May be related to prednisone  -3/24 increase coreg to 12.5 mg BID, blood pressures controlled  Vitals:   02/08/23 1942 02/09/23 0408  BP: 124/72 123/73  Pulse: 82 93  Resp: 19 18  Temp: 98.3 F (36.8 C) 98.5 F (36.9 C)  SpO2: 99% 95%    9: Hyperlipidemia: continue statin   10: Acute left forefoot gout +/- cellulitis: already appears improved today -continue colchicine -continue prednisone 40 mg daily through 3/21 -Ancef 1 mg q 8 hours through 3/23 but no evidence of cellulitis , gout is being treated will D/C Continue colchicine twice daily   11: GERD/HH/hx of GI bleed, H. pylori: continue Protonix 40 mg daily   12: DM: CBGs q AC and q HS; home med [place] 2 mg q Saturday             -continue carb modified diet;  prednisone elevating cbg's             -per diabetic manager 3/19: change Novolog correction to 0-9 units from 0-15 and consider Novolog 2 units TID meal coverage if eats >50% meal  CBG (last 3)  Recent Labs    02/07/23 1700 02/07/23 2027 02/08/23 0555  GLUCAP 109* 133* 121*   Improved off  prednisone - appears to be running normal CBGs off prednisone will monitor for  a couple more days , consider d/c if running normal values  D/C CBG    13: AKI atop CKD stage II (1.3-1.5 SCr baseline). Back to baseline     Latest Ref Rng & Units 02/08/2023    6:25 AM 02/06/2023    6:31 AM 02/01/2023    5:38 AM  BMP  Glucose 70 - 99 mg/dL  99  135   BUN 8 - 23 mg/dL  22  33   Creatinine 0.44 - 1.00 mg/dL  1.46  1.57   Sodium 135 - 145 mmol/L  139  137   Potassium 3.5 - 5.1 mmol/L 3.5  3.2  3.9   Chloride 98 - 111 mmol/L  106  110   CO2 22 - 32 mmol/L  23  21   Calcium 8.9 - 10.3 mg/dL  8.4  8.2       HypoK+ KCL 14meq qam x 2 d, recheck BMET mid week, still on low side so will cont daily  KCL 91meq qam x 2 more days re check BMET on Friday  14: Morbid  obesity: BMI = 41.52  15. Nausea with medications  -Discussed with pharmacy-appreciate assistance ,adjust Crestor and Claritin to HS          LOS: 9 days A FACE TO FACE EVALUATION WAS PERFORMED  Charlett Blake 02/09/2023, 8:52 AM

## 2023-02-09 NOTE — Progress Notes (Signed)
Occupational Therapy Discharge Summary  Patient Details  Name: [name] MRN: [id number] Date of Birth: May 23, 1956  Date of Discharge from [place] 28, 2024  Patient has met 11 of 11 long term goals due to improved activity tolerance, improved balance, postural control, ability to compensate for deficits, functional use of  RIGHT upper and RIGHT lower extremity, and improved coordination.  Patient to discharge at overall Modified Independent level.  Patient's care partner is independent to provide the necessary physical assistance at discharge.    Reasons goals not met: n/a  Recommendation:  Patient will benefit from ongoing skilled OT services in home health setting to continue to advance functional skills in the area of iADL.  Equipment: No equipment provided  Reasons for discharge: treatment goals met  Patient/family agrees with progress made and goals achieved: Yes  OT Discharge Precautions/Restrictions  Precautions Precautions: Fall Restrictions Weight Bearing Restrictions: No ADL ADL Eating: Independent Grooming: Independent Where Assessed-Grooming: Standing at sink Upper Body Bathing: Independent Where Assessed-Upper Body Bathing: Shower Lower Body Bathing: Modified independent Where Assessed-Lower Body Bathing: Shower Upper Body Dressing: Independent Lower Body Dressing: Modified independent Where Assessed-Lower Body Dressing: Edge of bed Toileting: Modified independent Where Assessed-Toileting: Glass blower/designer: Diplomatic Services operational officer Method: Counselling psychologist: Energy manager: Modified independent Clinical cytogeneticist Method: Optometrist: Grab bars, Civil engineer, contracting with back Social research officer, government: Modified independent Social research officer, government Method: Heritage manager: Radio broadcast assistant, Grab bars Vision Baseline Vision/History: 1 Wears  glasses Patient Visual Report: No change from baseline Vision Assessment?: No apparent visual deficits;Yes Perception  Perception: Within Functional Limits Praxis Praxis: Intact Cognition Cognition Overall Cognitive Status: Within Functional Limits for tasks assessed Orientation Level: Person;Place;Situation Person: Oriented Place: Oriented Situation: Oriented Memory: Appears intact Awareness: Appears intact Problem Solving: Appears intact Safety/Judgment: Appears intact Brief Interview for Mental Status (BIMS) Repetition of Three Words (First Attempt): 3 Temporal Orientation: Year: Correct Temporal Orientation: Month: Accurate within 5 days Temporal Orientation: Day: Correct Recall: "Sock": Yes, no cue required Recall: "Blue": Yes, no cue required Recall: "Bed": Yes, no cue required BIMS Summary Score: 15 Sensation Sensation Light Touch: Appears Intact Hot/Cold: Appears Intact Proprioception: Appears Intact Stereognosis: Impaired by gross assessment Additional Comments: can feel larger objects in R hand but not very small ones (ie a small screw) Coordination Gross Motor Movements are Fluid and Coordinated: No Fine Motor Movements are Fluid and Coordinated: No Coordination and Movement Description: Decreased motor coordination Finger Nose Finger Test: WFL B sides Heel Shin Test: WFL B sides 9 Hole Peg Test: R hand 45 seconds Motor  Motor Motor: Hemiplegia Motor - Skilled Clinical Observations: R side weakness Mobility    Mod I with RW Trunk/Postural Assessment  Postural Control Postural Control: Within Functional Limits  Balance Static Sitting Balance Static Sitting - Level of Assistance: 7: Independent Dynamic Sitting Balance Dynamic Sitting - Level of Assistance: 7: Independent Static Standing Balance Static Standing - Level of Assistance: 7: Independent Dynamic Standing Balance Dynamic Standing - Level of Assistance: 6: Modified independent  (Device/Increase time) with use of RW for support Extremity/Trunk Assessment RUE Assessment General Strength Comments: 4/5 shoulder, hand 5/5 LUE Assessment General Strength Comments: 4/5   [place] 02/09/2023, 1:21 PM

## 2023-02-09 NOTE — Progress Notes (Signed)
Patient ID: [name], female   DOB: 05-31-1956, 67 y.o.   MRN: [id number]  [facility] referral sent to [facility]

## 2023-02-09 NOTE — Progress Notes (Signed)
Physical Therapy Discharge Summary  Patient Details  Name: [name] MRN: [id number] Date of Birth: April 21, 1956  Date of Discharge from PT service:February 09, 2023  Today's Date: 02/09/2023 PT Individual Time: 0803-0915 PT Individual Time Calculation (min): 72 min    Patient has met 8 of 8 long term goals due to improved activity tolerance, improved balance, improved postural control, increased strength, ability to compensate for deficits, functional use of  right upper extremity and right lower extremity, improved attention, improved awareness, and improved coordination.  Patient to discharge at an ambulatory level Supervision.   Patient's care partner is independent to provide the necessary physical and cognitive assistance at discharge.  Reasons goals not met: n/a  Recommendation:  Patient will benefit from ongoing skilled PT services in home health setting to continue to advance safe functional mobility, address ongoing impairments in functional transfers and ambulation, and minimize fall risk.  Equipment: No equipment provided  Reasons for discharge: treatment goals met  Patient/family agrees with progress made and goals achieved: Yes  PT Discharge Precautions/Restrictions Precautions Precautions: Fall Restrictions Weight Bearing Restrictions: No Vital Signs Therapy Vitals BP: 126/74 Pain  No/denies pain at start and throughout session Pain Interference Pain Interference Pain Effect on Sleep: 1. Rarely or not at all Pain Interference with Therapy Activities: 2. Occasionally;1. Rarely or not at all Pain Interference with Day-to-Day Activities: 1. Rarely or not at all Vision/Perception  Vision - History Ability to See in Adequate Light: 0 Adequate  Cognition Overall Cognitive Status: Within Functional Limits for tasks assessed Arousal/Alertness: Awake/alert Orientation Level: Oriented X4 Year: 2024 Month: March Day of Week: Correct Memory: Appears  intact Awareness: Appears intact Problem Solving: Appears intact Safety/Judgment: Appears intact Sensation Sensation Light Touch: Appears Intact Hot/Cold: Not tested Proprioception: Appears Intact Stereognosis: Not tested Coordination Gross Motor Movements are Fluid and Coordinated: No Finger Nose Finger Test: WFL B sides Heel Shin Test: WFL B sides Motor  Motor Motor: Hemiplegia Motor - Skilled Clinical Observations: R side weakness  Motor - Discharge Observations: improved strength and coordination on Rt side resulting in improved independence with functional transfers and mobility  Mobility Bed Mobility Bed Mobility: Rolling Right;Rolling Left;Supine to Sit;Sit to Supine Rolling Right: Independent Rolling Left: Independent Supine to Sit: Independent Sitting - Scoot to Edge of Bed: Independent Sit to Supine: Independent Scooting to HOB: Independent Transfers Transfers: Sit to Stand;Stand to [employer] Transfers Sit to Stand: Independent with assistive device Stand to Sit: Independent with assistive device Stand Pivot Transfers: Independent with assistive device Transfer (Assistive device): Rolling walker Locomotion  Gait Ambulation: Yes Gait Assistance: Supervision/Verbal cueing;  Gait Distance (Feet): 200 Feet Assistive device: Rolling walker Gait Assistance Details: Verbal cues for technique; cues for hand placement on RW/set up  Gait Gait: Yes Gait Pattern: Lateral trunk lean to left  Gait velocity: decreased Stairs / Additional Locomotion Stairs: Yes Stairs Assistance: Supervision/Verbal cueing;  Stair Management Technique: Two rails;Step to pattern;Alternating pattern;  Number of Stairs: 12 Height of Stairs: 6 Ramp: Supervision/Verbal cueing  Curb: Supervision/Verbal cueing  Wheelchair Mobility Wheelchair Mobility: Noe Pick up small object from the floor assist level: Supervision/Verbal cueing  Trunk/Postural Assessment  Cervical  Assessment Cervical Assessment: Within Functional Limits Thoracic Assessment Thoracic Assessment: Within Functional Limits Lumbar Assessment Lumbar Assessment: Within Functional Limits Postural Control Postural Control: Within Functional Limits  Balance Balance Balance Assessed: Yes Static Sitting Balance Static Sitting - Balance Support: Feet supported Static Sitting - Level of Assistance: 6: Modified independent (Device/Increase time) Dynamic Sitting Balance  Dynamic Sitting - Balance Support: Feet supported Dynamic Sitting - Level of Assistance: 6: Modified independent (Device/Increase time) Dynamic Sitting Balance - Compensations: pericare on toilet Static Standing Balance Static Standing - Balance Support: During functional activity Static Standing - Level of Assistance: 6: Modified independent (Device/Increase time) Dynamic Standing Balance Dynamic Standing - Balance Support: During functional activity Dynamic Standing - Level of Assistance: 5: Stand by assistance Extremity Assessment      RLE Assessment RLE Assessment: Exceptions to [facility] Active Range of Motion (AROM) Comments: WFL RLE Strength Right Hip Flexion: 4/5 Right Hip ABduction: 4+/5 Right Hip ADduction: 4+/5 Right Knee Flexion: 4+/5 Right Knee Extension: 4+/5 Right Ankle Dorsiflexion: 4/5 Right Ankle Plantar Flexion: 4-/5 LLE Assessment LLE Assessment: Exceptions to [facility] Active Range of Motion (AROM) Comments: WFL LLE Strength Left Hip Flexion: 4+/5 Left Hip ABduction: 4+/5 Left Hip ADduction: 4+/5 Left Knee Flexion: 4+/5 Left Knee Extension: 4+/5 Left Ankle Dorsiflexion: 4+/5 Left Ankle Plantar Flexion: 4-/5  PT Session Details: Pt received in recliner and eager to participate in therapy session. Sit<>stand completed at Mod I level with RW from recliner and pt ambulated >150' to ortho gym with RW at [facility] independent level. Pt completed care transfer at mod I level stepping into car with use of frame,  cues for sit and pivot technique and pt demonstrated safe technique. Pt ambulated on uneven ramp and mulch surface and negotiated curb with RW and supervision for safety. Throughout gait pt occasionally required cues to maintain hand position on grippers of RW. Ambulated to main gym for stair mobility and pt performed single curb with RW 2x to ascend/descend with supervision and ascended/descended 12 stairs (6") with bil HR and alternating pattern to ascend but step to pattern for descent; supervision for safety. Pt completed floor transfer with supervision 3x to move from sitting EOM to quadruped on floor mat. EOS ambulated back to room with RW at mod I level and ended sitting in recliner. Pt excited to return home tomorrow and ready for next therapy session for the day. Alarm on and call bell within reach in recliner at EOS.  Verner Mould, DPT Acute Rehabilitation Services Office [phone]  02/09/23 9:18 AM

## 2023-02-09 NOTE — Progress Notes (Signed)
Occupational Therapy Session Note  Patient Details  Name: [name] MRN: [id number] Date of Birth: 04/24/1956  Today's Date: 02/09/2023 OT Individual Time: 1415-1500 OT Individual Time Calculation (min): 45 min    Short Term Goals: Week 1:  OT Short Term Goal 1 (Week 1): STGs = LTGs  Skilled Therapeutic Interventions/Progress Updates:    Pt greeted seated in recliner and agreeable to OT Treatment session. Pt declined to shower today. She ambulated to therapy gym mod I. OT issued home fine motor program and reviewed activities with patient. Worked on Tree surgeon and writing tasks. Pt with some difficulty maintaining grasp on pen. Educated on modifications to improve functional grip. Standing balance nad hand-eye coordination with ball toss with oblique twist. UB there-ex using 3 lb dowel rod 3 sets of 10 chest press, bicep curl and straight arm raise. Pt ambulated back to room mod I and left seated in recliner with needs met.   Therapy Documentation Precautions:  Precautions Precautions: Fall Restrictions Weight Bearing Restrictions: No Pain:  Denies pain   Therapy/Group: Individual Therapy  Valma Cava 02/09/2023, 2:35 PM

## 2023-02-10 ENCOUNTER — Other Ambulatory Visit (HOSPITAL_COMMUNITY): Payer: Self-pay

## 2023-02-10 LAB — BASIC METABOLIC PANEL
Anion gap: 12 (ref 5–15)
BUN: 26 mg/dL — ABNORMAL HIGH (ref 8–23)
CO2: 23 mmol/L (ref 22–32)
Calcium: 8.4 mg/dL — ABNORMAL LOW (ref 8.9–10.3)
Chloride: 106 mmol/L (ref 98–111)
Creatinine, Ser: 1.66 mg/dL — ABNORMAL HIGH (ref 0.44–1.00)
GFR, Estimated: 34 mL/min — ABNORMAL LOW (ref >60)
Glucose, Bld: 110 mg/dL — ABNORMAL HIGH (ref 70–99)
Potassium: 3.9 mmol/L (ref 3.5–5.1)
Sodium: 141 mmol/L (ref 135–145)

## 2023-02-10 MED ORDER — PANTOPRAZOLE SODIUM 40 MG PO TBEC
40.0000 mg | DELAYED_RELEASE_TABLET | Freq: Every day | ORAL | 0 refills | Status: DC
  Filled 2023-02-10: qty 30, 30d supply, fill #0

## 2023-02-10 MED ORDER — AMLODIPINE BESYLATE 10 MG PO TABS
10.0000 mg | ORAL_TABLET | Freq: Every day | ORAL | 0 refills | Status: DC
  Filled 2023-02-10: qty 30, 30d supply, fill #0

## 2023-02-10 MED ORDER — COLCHICINE 0.6 MG PO TABS
0.6000 mg | ORAL_TABLET | Freq: Every day | ORAL | 0 refills | Status: DC
  Filled 2023-02-10: qty 14, 14d supply, fill #0

## 2023-02-10 MED ORDER — METHOCARBAMOL 500 MG PO TABS
500.0000 mg | ORAL_TABLET | Freq: Four times a day (QID) | ORAL | 0 refills | Status: DC | PRN
  Filled 2023-02-10: qty 30, 8d supply, fill #0

## 2023-02-10 MED ORDER — CARVEDILOL 12.5 MG PO TABS
12.5000 mg | ORAL_TABLET | Freq: Two times a day (BID) | ORAL | 0 refills | Status: DC
  Filled 2023-02-10: qty 60, 30d supply, fill #0

## 2023-02-10 MED ORDER — ASPIRIN 81 MG PO TBEC: 81.0000 mg | DELAYED_RELEASE_TABLET | Freq: Every day | ORAL | 12 refills | Status: DC

## 2023-02-10 MED ORDER — ROSUVASTATIN CALCIUM 20 MG PO TABS
20.0000 mg | ORAL_TABLET | Freq: Every day | ORAL | 0 refills | Status: DC
  Filled 2023-02-10: qty 30, 30d supply, fill #0

## 2023-02-10 MED ORDER — LORATADINE 10 MG PO TABS
10.0000 mg | ORAL_TABLET | Freq: Every day | ORAL | 0 refills | Status: DC
  Filled 2023-02-10: qty 30, 30d supply, fill #0

## 2023-02-10 MED ORDER — ALBUTEROL SULFATE HFA 108 (90 BASE) MCG/ACT IN AERS
2.0000 | INHALATION_SPRAY | Freq: Four times a day (QID) | RESPIRATORY_TRACT | 0 refills | Status: DC | PRN
  Filled 2023-02-10: qty 6.7, 25d supply, fill #0

## 2023-02-10 NOTE — Progress Notes (Signed)
Inpatient Rehabilitation Care Coordinator Discharge Note   Patient Details  Name: [name] MRN: [id number] Date of Birth: 1956/07/15   Discharge location: Home  Length of Stay: 10 Days  Discharge activity level: MOD I/SUP  Home/community participation: Grandson  Patient response [id number] Literacy - How often do you need to have someone help you when you read instructions, pamphlets, or other written material from your doctor or pharmacy?: Never  Patient response [id number] Isolation - How often do you feel lonely or isolated from those around you?: Never  Services provided included: MD, RD, PT, OT, SLP, CM, RN, TR, Pharmacy, SW  Financial Services:  Charity fundraiser Utilized: [place] offered to/list presented to: Patient  Follow-up services arranged:  Home Health, DME Home Health Agency: [place] PT OT - [facility] 4/1    DME : NO DME NEEDS    Patient response to transportation need: Is the patient able to respond to transportation needs?: Yes In the past 12 months, has lack of transportation kept you from medical appointments or from getting medications?: No In the past 12 months, has lack of transportation kept you from meetings, work, or from getting things needed for daily living?: No    Comments (or additional information):  Patient/Family verbalized understanding of follow-up arrangements:  Yes  Individual responsible for coordination of the follow-up plan: self  Confirmed correct DME delivered: Dyanne Iha 02/10/2023    Dyanne Iha

## 2023-02-10 NOTE — Progress Notes (Signed)
Patient ID: [name], female   DOB: 03/08/1956, 67 y.o.   MRN: [id number]  SW met with patient and daughter to FU on any questions or concerns before d/c. Handicapped placement card application provided. No questions or concerns.

## 2023-02-10 NOTE — Progress Notes (Signed)
Patient discharged to home, accompanied by her daughter. 

## 2023-02-10 NOTE — Progress Notes (Signed)
PROGRESS NOTE   Subjective/Complaints:  Labs reviewed , repeat K+ is nl   ROS- neg CP, SOB,N/V/D, + nasal congestion Nausea-improved  Objective:   No results found. No results for input(s): "WBC", "HGB", "HCT", "PLT" in the last 72 hours.  Recent Labs    02/08/23 0625 02/10/23 0535  NA  --  141  K 3.5 3.9  CL  --  106  CO2  --  23  GLUCOSE  --  110*  BUN  --  26*  CREATININE  --  1.66*  CALCIUM  --  8.4*     Intake/Output Summary (Last 24 hours) at 02/10/2023 0804 Last data filed at 02/10/2023 0705 Gross per 24 hour  Intake 480 ml  Output --  Net 480 ml         Physical Exam: Vital Signs Blood pressure (!) 110/52, pulse 86, temperature 98 F (36.7 C), temperature source Oral, resp. rate 18, height 5\' 2"  (1.575 m), weight 108.5 kg, last menstrual period 11/16/2002, SpO2 100 %.  General: No acute distress, sitting in bed Mood and affect are appropriate, pleasant Heart: Regular rate and rhythm no rubs murmurs or extra sounds Lungs: Clear to auscultation, breathing unlabored, no rales or wheezes, good air movement Abdomen: normoactive bowel sounds, soft nontender to palpation, nondistended Extremities: No clubbing, cyanosis, or edema Skin: No evidence of breakdown, no evidence of rash Neuro:  Eyes without evidence of nystagmus  Tone is normal without evidence of spasticity Cerebellar exam - mild sensory ataxia noted BUE, BLE  Motor strength is 5/5 in bilateral deltoid, biceps, triceps, finger flexors and extensors, wrist flexors and extensors, hip flexors, knee flexors and extensors, ankle dorsiflexors, plantar flexors, invertors and evertors, toe flexors and extensors  Sensory exam is normal reduced light touch sensation right hemifacial.  There are paresthesias in bilateral upper and lower limbs with sensory exam  MSK Right shoulder negative impingement has full range of motion mild endrange  pain.  Assessment/Plan: 1. Functional deficits due to left thalamic bleed, improving on latest CT head, no evidence of worsening deficits on ASA  Stable for D/C today F/u PCP in 3-4 weeks F/u PM&R 2 weeks F/u Neuro 1-2 mo  See D/C summary See D/C instructions   Care Tool:  Bathing    Body parts bathed by patient: Right arm, Left arm, Chest, Abdomen, Front perineal area, Buttocks, Right upper leg, Left upper leg, Face, Left lower leg, Right lower leg   Body parts bathed by helper: Left lower leg, Right lower leg     Bathing assist Assist Level: Supervision/Verbal cueing     Upper Body Dressing/Undressing Upper body dressing   What is the patient wearing?: Pull over shirt    Upper body assist Assist Level: Set up assist    Lower Body Dressing/Undressing Lower body dressing      What is the patient wearing?: Pants     Lower body assist Assist for lower body dressing: Supervision/Verbal cueing     Toileting Toileting    Toileting assist Assist for toileting: Independent with assistive device     Transfers Chair/bed transfer  Transfers assist     Chair/bed transfer assist level: Independent  with assistive device     Locomotion Ambulation   Ambulation assist      Assist level: Independent with assistive device Assistive device: Walker-rolling Max distance: >200   Walk 10 feet activity   Assist     Assist level: Independent with assistive device Assistive device: Walker-rolling   Walk 50 feet activity   Assist    Assist level: Independent with assistive device Assistive device: Walker-rolling    Walk 150 feet activity   Assist    Assist level: Independent with assistive device Assistive device: Walker-rolling    Walk 10 feet on uneven surface  activity   Assist     Assist level: Supervision/Verbal cueing Assistive device: Walker-rolling   Wheelchair     Assist Is the patient using a wheelchair?: No Type of  Wheelchair: Manual    Wheelchair assist level: Minimal Assistance - Patient > 75% Max wheelchair distance: 49ft    Wheelchair 50 feet with 2 turns activity    Assist        Assist Level: Moderate Assistance - Patient 50 - 74%   Wheelchair 150 feet activity     Assist      Assist Level: Total Assistance - Patient < 25%   Blood pressure (!) 110/52, pulse 86, temperature 98 F (36.7 C), temperature source Oral, resp. rate 18, height 5\' 2"  (1.575 m), weight 108.5 kg, last menstrual period 11/16/2002, SpO2 100 %.  Medical Problem List and Plan: 1. Functional deficits secondary to left thalamic ICH d/t hypertension with additional subacute infarcts in the left fronto-parietal white matter infarcts d/t small vessel disease            d/c home today    2.  Antithrombotics: -DVT/anticoagulation:  Mechanical:  Antiembolism stockings, knee (TED hose) Bilateral lower extremities             -antiplatelet therapy: restarting 81 mg aspirin monitor    3. Pain Management: Tylenol as needed Right shoulder pain no sign of impingement syndrome likely post stroke.  Will use K-pad   4. Mood/Behavior/Sleep: LCSW to evaluate and provide emotional support             -antipsychotic agents: n/a   5. Neuropsych/cognition: This patient is capable of making decisions on her own behalf.   6. Skin/Wound Care: Routine skin care checks   7. Fluids/Electrolytes/Nutrition: Routine Is and Os and follow-up chemistries   8: Hypertension: monitor TID and prn             -continue Coreg 6.25 mg BID             -continue Norvasc 10 mg daily             -bp currently borderline. May be related to prednisone  -3/24 increase coreg to 12.5 mg BID, blood pressures controlled  Vitals:   02/09/23 1945 02/10/23 0413  BP: (!) 118/52 (!) 110/52  Pulse: 90 86  Resp: 18 18  Temp: 98.4 F (36.9 C) 98 F (36.7 C)  SpO2: 99% 100%    9: Hyperlipidemia: continue statin   10: Acute left forefoot gout +/-  cellulitis: already appears improved today -continue colchicine -continue prednisone 40 mg daily through 3/21 -Ancef 1 mg q 8 hours through 3/23 but no evidence of cellulitis , gout is being treated will D/C Continue colchicine twice daily   11: GERD/HH/hx of GI bleed, H. pylori: continue Protonix 40 mg daily   12: DM: CBGs q AC and q HS; home med  [place] 2 mg q Saturday             -continue carb modified diet;  prednisone elevating cbg's             -per diabetic manager 3/19: change Novolog correction to 0-9 units from 0-15 and consider Novolog 2 units TID meal coverage if eats >50% meal  CBG (last 3)  Recent Labs    02/07/23 1700 02/07/23 2027 02/08/23 0555  GLUCAP 109* 133* 121*   Improved off prednisone - appears to be running normal CBGs off prednisone will monitor for  a couple more days , consider d/c if running normal values  D/C CBG    13: AKI atop CKD stage II (1.3-1.5 SCr baseline). Back to baseline     Latest Ref Rng & Units 02/10/2023    5:35 AM 02/08/2023    6:25 AM 02/06/2023    6:31 AM  BMP  Glucose 70 - 99 mg/dL 110   99   BUN 8 - 23 mg/dL 26   22   Creatinine 0.44 - 1.00 mg/dL 1.66   1.46   Sodium 135 - 145 mmol/L 141   139   Potassium 3.5 - 5.1 mmol/L 3.9  3.5  3.2   Chloride 98 - 111 mmol/L 106   106   CO2 22 - 32 mmol/L 23   23   Calcium 8.9 - 10.3 mg/dL 8.4   8.4       HypoK+ KCL 102meq qam x 2 d, recheck BMET mid week, still on low side so will cont daily  KCL 45meq qam x 2 more days re check BMET improved d/c KCL  14: Morbid  obesity: BMI = 41.52            LOS: 10 days A FACE TO FACE EVALUATION WAS PERFORMED  Charlett Blake 02/10/2023, 8:04 AM

## 2023-02-10 NOTE — Progress Notes (Signed)
Inpatient Rehabilitation Discharge Medication Review by a Pharmacist  A complete drug regimen review was completed for this patient to identify any potential clinically significant medication issues.  High Risk Drug Classes Is patient taking? Indication by Medication  Antipsychotic No   Anticoagulant No   Antibiotic No   Opioid No   Antiplatelet No   Hypoglycemics/insulin No   Vasoactive Medication Yes Carvedilol, Amlodipine - BP  Chemotherapy No   Other Yes Methocarbamol - prn spasms Albuterol - prn wheezing Colchicine - gout Pantoprazole - Reflux Rosuvastatin - HLD     Type of Medication Issue Identified Description of Issue Recommendation(s)  Drug Interaction(s) (clinically significant)     Duplicate Therapy     Allergy     No Medication Administration End Date     Incorrect Dose     Additional Drug Therapy Needed     Significant med changes from prior encounter (inform family/care partners about these prior to discharge).    Other       Clinically significant medication issues were identified that warrant physician communication and completion of prescribed/recommended actions by midnight of the next day:  No  Pharmacist comments: Aspirin resumed  Time spent performing this drug regimen review (minutes):  20 minutes  Thank you Anette Guarneri, PharmD

## 2023-02-10 NOTE — Progress Notes (Signed)
Patient ID: [name], female   DOB: 11-23-1955, 67 y.o.   MRN: [id number]  Patient approved by [facility] [place] 4/1.

## 2023-02-13 ENCOUNTER — Telehealth: Payer: Self-pay | Admitting: *Deleted

## 2023-02-13 NOTE — Telephone Encounter (Signed)
Transition Care Management Unsuccessful Follow-up Telephone Call  Date of discharge and from where:  02/10/23  Attempts:  1st Attempt  Reason for unsuccessful TCM follow-up call:  Unable to leave message

## 2023-02-21 ENCOUNTER — Encounter: Payer: Medicare Other | Admitting: Registered Nurse

## 2023-03-01 ENCOUNTER — Encounter: Payer: Medicare Other | Attending: Registered Nurse | Admitting: Registered Nurse

## 2023-03-01 ENCOUNTER — Encounter: Payer: Self-pay | Admitting: Registered Nurse

## 2023-03-01 VITALS — BP 132/85 | HR 77 | Ht 62.0 in | Wt 224.0 lb

## 2023-03-01 DIAGNOSIS — I10 Essential (primary) hypertension: Secondary | ICD-10-CM | POA: Insufficient documentation

## 2023-03-01 DIAGNOSIS — I610 Nontraumatic intracerebral hemorrhage in hemisphere, subcortical: Secondary | ICD-10-CM | POA: Diagnosis not present

## 2023-03-01 DIAGNOSIS — E7849 Other hyperlipidemia: Secondary | ICD-10-CM | POA: Insufficient documentation

## 2023-03-01 NOTE — Progress Notes (Signed)
Subjective:    Patient ID: [name], female    DOB: Jan 20, 1956, 67 y.o.   MRN: [id number]  HPI: [name] is a 67 y.o. female who is here for HFU appointment for F/U of her ICH ( Intracerebral hemorrhage).Functional Deficits secondary to left Thalamic ICH,  Essential Hypertension and Hyperlipidemia.  [name] presented to the Emergency room on 01/27/2023, with complaints of right- sided numbness tingling.    H&P  : 01/27/2023: Dr Amada Jupiter Tasnim Balentine is a 67 y.o. 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  She states that it started around 4 PM and the numbness has been persistent since that time.  The tingling has improved.  Due to the symptoms, she sought care in the emergency department where she had a head CT performed which demonstrates a small intraparenchymal hematoma.  She has been very hypertensive with systolics in the 200s.  She states that she has been compliant with her antihypertensives, but she was supposed to start a new one soon and has not had a chance to start it yet.   She also notes that she had a transient episode of diplopia earlier today, this is resolved.  CT Head: WO Contrast IMPRESSION: 1. Small intraparenchymal hematoma in the left thalamus, measuring 12 x 9 x 8 mm (<1 mL). No midline shift or other mass effect. 2. Old infarct of the right basal ganglia.  MR: Brain WO Contrast IMPRESSION: 1. Small acute parenchymal hemorrhage within the left thalamus, unchanged in size from the head CT performed 01/27/2023. 2. Small subacute infarcts within the left frontoparietal white matter, measuring up to 9 mm. 3. Background chronic small vessel ischemic disease, chronic infarcts and chronic hemorrhages as described  MRA:  IMPRESSION: 1. No intracranial large vessel occlusion is identified. 2. Intracranial atherosclerotic  disease with multifocal stenoses, most notably as follows. 3. Progressive severe long segment stenosis within a proximal M2 left middle cerebral artery vessel. 4. Severe stenosis within a mid-to-distal right M2 middle cerebral artery vessel (at site of prior occlusion). 5. Progressive moderate/severe stenosis within the left posterior cerebral artery P2 segment. 6. 2 mm inferiorly projecting vascular protrusion arising from the cavernous left internal carotid artery. This may reflect an aneurysm or the origin of an otherwise poorly delineated branch vessel.   [name] was admitted to Inpatient Rehabilitation on 01/31/2023, Neurology was following.  According to Discharge Summary she was started on Aspirin 81 mg daily on 02/08/2023. She denies any pain. She rates her pain 0.   She is receiving Home Health Therapy from [facility].   She reports her appetite is fair.     Pain Inventory Average Pain 0 Pain Right Now 0 My pain is  No pain   LOCATION OF PAIN  No pain  BOWEL Number of stools per week: 3-4 Oral laxative use No    BLADDER Normal I   Mobility how many minutes can you walk? 20 ability to climb steps?  yes do you drive?  no Do you have any goals in this area?  yes  Function retired  Neuro/Psych numbness tingling dizziness  Prior Studies Any changes since last visit?  no  Physicians involved in your care Any changes since last visit?  no   Family History  Problem Relation Age of Onset   Colon polyps Mother    Diabetes Maternal Grandmother    Breast cancer Maternal Grandmother  Diabetes Father    Breast cancer Daughter    Colon cancer Neg Hx    Social History   Socioeconomic History   Marital status: Divorced    Spouse name: Not on file   Number of children: 3   Years of education: Not on file   Highest education level: Not on file  Occupational History   Occupation: retired  Tobacco Use   Smoking status: Never   Smokeless  tobacco: Never  Vaping Use   Vaping Use: Never used  Substance and Sexual Activity   Alcohol use: No    Alcohol/week: 0.0 standard drinks of alcohol   Drug use: No   Sexual activity: Never  Other Topics Concern   Not on file  Social History Narrative   Regular exercise: walk 2 days, aerob[facility] 3 days[facility]   Caffeine use: coffee every AM   Social Determinants of Health   Financial Resource Strain: Low Risk  (05/28/2021)   Overall Financial Resource Strain (CARDIA)    Difficulty of Paying Living Expenses: Not hard at all  Food Insecurity: No Food Insecurity (01/30/2023)   Hunger Vital Sign    Worried About Running Out of Food in the Last Year: Never true    Ran Out of Food in the Last Year: Never true  Transportation Needs: No Transportation Needs (01/30/2023)   PRAPARE - Administrator, Civil Service (Medical): No    Lack of Transportation (Non-Medical): No  Physical Activity: Not on file  Stress: No Stress Concern Present (05/28/2021)   [employer] of Occupational Health - Occupational Stress Questionnaire    Feeling of Stress : Only a little  Social Connections: Moderately Integrated (05/28/2021)   Social Connection and Isolation Panel [NHANES]    Frequency of Communication with Friends and Family: More than three times a week    Frequency of Social Gatherings with Friends and Family: More than three times a week    Attends Religious Services: 1 to 4 times per year    Active Member of Clubs or Organizations: Yes    Attends Banker Meetings: 1 to 4 times per year    Marital Status: Divorced   Past Surgical History:  Procedure Laterality Date   ABDOMINAL HYSTERECTOMY  2004   BIOPSY  10/23/2019   Procedure: BIOPSY;  Surgeon: Shellia Cleverly, DO;  Location: [facility];  Service: Gastroenterology;;   CHOLECYSTECTOMY     COLONOSCOPY  12/19/2008   diverticulosis   COLONOSCOPY  11/17/2011   Procedure: COLONOSCOPY;  Surgeon: Erick Blinks, MD;  Location: [facility];  Service: Gastroenterology;  Laterality: N/A;   DILATION AND CURETTAGE OF UTERUS N/A 07/11/2014   Procedure: Repair of Vaginal Cuff;  Surgeon: Bing Plume, MD;  Location: [facility];  Service: Gynecology;  Laterality: N/A;   ESOPHAGOGASTRODUODENOSCOPY (EGD) WITH PROPOFOL N/A 10/23/2019   Procedure: ESOPHAGOGASTRODUODENOSCOPY (EGD) WITH PROPOFOL;  Surgeon: Shellia Cleverly, DO;  Location: [facility];  Service: Gastroenterology;  Laterality: N/A;   FLEXIBLE SIGMOIDOSCOPY  12/12/2008   diverticulosis   HAND SURGERY     x5 right   HYDRADENITIS EXCISION Right 03/21/2014   Procedure: EXCISION HIDRADENITIS AXILLA;  Surgeon: Valarie Merino, MD;  Location: [facility];  Service: General;  Laterality: Right;   KNEE ARTHROSCOPY     x2 left   NEPHRECTOMY  2013   left   TRIGGER FINGER RELEASE Left 06/23/2015   Procedure: RELEASE TRIGGER FINGER/A-1 PULLEY LEFT RING FINGER;  Surgeon: Cindee Salt, MD;  Location: [facility];  Service: Orthopedics;  Laterality: Left;   TUBAL LIGATION  1980   UPPER GASTROINTESTINAL ENDOSCOPY  12/12/2008   gastroporesis   XI ROBOTIC ASSISTED VENTRAL HERNIA N/A 01/21/2022   Procedure: ROBOTIC INCISIONAL HERNIA REPAIR WITH MESH, BILATERAL POSTERIOR RECTUS MYOFASCIAL RELEASE;  Surgeon: Quentin Ore, MD;  Location: [facility];  Service: General;  Laterality: N/A;   Past Medical History:  Diagnosis Date   Anemia    Blood transfusion 2010   associated with GI bleeding   CAD (coronary artery disease)    CHF (congestive heart failure) (HCC)    CKD (chronic kidney disease) stage 2, GFR 60-89 ml/min    Depression    Diverticulosis of colon with hemorrhage 2010, 09/2011   DM2 (diabetes mellitus, type 2) (HCC)    Gout 01/30/2023   Left Foot   H/O hiatal hernia    Helicobacter pylori gastritis 09/13/2013   Hemorrhagic stroke (HCC) 2019   Right Basal Ganglia   Hx: UTI (urinary tract infection)     Hypertension    Intraparenchymal hemorrhage of brain (HCC) 01/27/2023   Morbid obesity with BMI of 40.0-44.9, adult (HCC)    Personal history of colonic polyps    adenomas since 2010   PVD (peripheral vascular disease) (HCC)    Renal cell carcinoma 2004   lt kidney removed   Stroke (HCC) 12/2017   05/2021   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   Venous stasis    LMP 11/16/2002   Opioid Risk Score:   Fall Risk Score:  `1  Depression screen [facility] 2/9     05/28/2021    4:25 AM 12/30/2015    4:11 PM 12/17/2015    2:11 PM 12/01/2015   10:37 AM 10/26/2015    1:43 PM 04/22/2015    1:53 PM 03/13/2013    8:16 AM  Depression screen PHQ 2/9  Decreased Interest 0 0 0 0 0 0 0  Down, Depressed, Hopeless 0 0 0 0 1 0 0  PHQ - 2 Score 0 0 0 0 1 0 0    Review of Systems  Neurological:  Positive for dizziness and numbness.       Tingling  All other systems reviewed and are negative.      Objective:   Physical Exam Vitals and nursing note reviewed.  Constitutional:      Appearance: Normal appearance. She is obese.  Cardiovascular:     Rate and Rhythm: Normal rate and regular rhythm.     Pulses: Normal pulses.     Heart sounds: Normal heart sounds.  Pulmonary:     Effort: Pulmonary effort is normal.     Breath sounds: Normal breath sounds.  Musculoskeletal:     Cervical back: Normal range of motion and neck supple.     Comments: Normal Muscle Bulk and Muscle Testing Reveals:  Upper Extremities: Full ROM and Muscle Strength on Right 4/5 and Left 5/5   Lower Extremities: Full ROM and Muscle Strength 5/5 Arises from Table slowly Narrow Based  Gait     Skin:    General: Skin is warm and dry.  Neurological:     Mental Status: She is alert and oriented to person, place, and time.  Psychiatric:        Mood and Affect: Mood normal.        Behavior: Behavior normal.         Assessment & Plan:  ICH (  Intracerebral hemorrhage).Functional Deficits secondary to left Thalamic ICH:  Continue current medication regimen. Continue Home health Therapy with [facility]. She states she has an appointment scheduled with Dr. Herbert Pun on 03/17/2023 Essential Hypertension: Continue current medication regimen. PCP Following   Hyperlipidemia.: Continue current medication regimen. PCP following continue to Monitor.  F/U with Dr Wynn Banker in 4- 6 weeks

## 2023-03-01 NOTE — Patient Instructions (Signed)
Call your Primary Care doctor to schedule Hospital Follow up appointment

## 2023-03-30 ENCOUNTER — Encounter: Payer: Medicare Other | Admitting: Physical Medicine & Rehabilitation

## 2023-04-06 ENCOUNTER — Encounter: Payer: Medicare Other | Attending: Physical Medicine & Rehabilitation | Admitting: Physical Medicine & Rehabilitation

## 2023-04-06 ENCOUNTER — Encounter: Payer: Self-pay | Admitting: Physical Medicine & Rehabilitation

## 2023-04-06 VITALS — BP 133/79 | HR 85 | Ht 62.0 in | Wt 227.7 lb

## 2023-04-06 DIAGNOSIS — I610 Nontraumatic intracerebral hemorrhage in hemisphere, subcortical: Secondary | ICD-10-CM | POA: Insufficient documentation

## 2023-04-06 NOTE — Progress Notes (Signed)
Subjective:    Patient ID: [name], female    DOB: 02/02/1956, 67 y.o.   MRN: [id number] 67 y.o. female who presented to the ED on 01/27/2023 complaining of tingling and numbness of her right side similar to symptoms she had from previous stroke. History of hypertension on HCTZ and lisinopril. CT head consistent with thalamic intraparenchymal hemorrhage. Neurology consulted. Systolic BP range in 200s. Admitted for BP control and further work-up. 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 AKI with serum creatinine to 2.10. IVFs NS at 75 cc/hr continues. A1c 7.1%. Inpatient diabetic coordinator consulted. She develop acute right foot pain and orthopedic surgery was consulted. Palin films obtained. Started on prednisone and colchicine for acute gout. Added Ancef for possible cellulitis. Light touch diminished in left arm. No antithrombotic prior to admission, now on no antithrombotic secondary to IPH.  Consider restarting 81 mg aspirin in 2 weeks after repeat CT head.  Admit date: 01/31/2023 Discharge date: 02/10/2023 HPI  Seen by [facility] in [facility] 03/17/23 and 02/07/22  Finished HHPT, no falls, was evaled by OT but was doing well with ADLs   No issues with gait but is slower than usual  Mod I ADLs Not cooking like she used to due to reduced coordination Right hand  Pt reports she has been oked to drive short distances by her Neurologist  Pain Inventory Average Pain 0 Pain Right Now 0 My pain is  no pain  LOCATION OF PAIN  no pain  BOWEL Number of stools per week: 5 Oral laxative use No  Type of laxative . Enema or suppository use No  History of colostomy No  Incontinent No   BLADDER Normal In and out cath, frequency . Able to self cath  . Bladder incontinence  . Frequent urination No  Leakage with coughing No  Difficulty starting stream No  Incomplete bladder emptying No    Mobility how many  minutes can you walk? 15 ability to climb steps?  yes do you drive?  yes  Function retired  Neuro/Psych tingling  Prior Studies Any changes since last visit?  no  Physicians involved in your care Any changes since last visit?  no   Family History  Problem Relation Age of Onset   Colon polyps Mother    Diabetes Maternal Grandmother    Breast cancer Maternal Grandmother    Diabetes Father    Breast cancer Daughter    Colon cancer Neg Hx    Social History   Socioeconomic History   Marital status: Divorced    Spouse name: Not on file   Number of children: 3   Years of education: Not on file   Highest education level: Not on file  Occupational History   Occupation: retired  Tobacco Use   Smoking status: Never   Smokeless tobacco: Never  Vaping Use   Vaping Use: Never used  Substance and Sexual Activity   Alcohol use: No    Alcohol/week: 0.0 standard drinks of alcohol   Drug use: No   Sexual activity: Never  Other Topics Concern   Not on file  Social History Narrative   Regular exercise: walk 2 days, aerobics 3 days[facility]   Caffeine use: coffee every AM   Social Determinants of Health   Financial Resource Strain: Low Risk  (05/28/2021)   Overall Financial Resource Strain (CARDIA)    Difficulty of Paying Living Expenses: Not hard  at all  Food Insecurity: No Food Insecurity (01/30/2023)   Hunger Vital Sign    Worried About Running Out of Food in the Last Year: Never true    Ran Out of Food in the Last Year: Never true  Transportation Needs: No Transportation Needs (01/30/2023)   PRAPARE - Administrator, Civil Service (Medical): No    Lack of Transportation (Non-Medical): No  Physical Activity: Not on file  Stress: No Stress Concern Present (05/28/2021)   [employer] of Occupational Health - Occupational Stress Questionnaire    Feeling of Stress : Only a little  Social Connections: Moderately Integrated (05/28/2021)   Social  Connection and Isolation Panel [NHANES]    Frequency of Communication with Friends and Family: More than three times a week    Frequency of Social Gatherings with Friends and Family: More than three times a week    Attends Religious Services: 1 to 4 times per year    Active Member of Clubs or Organizations: Yes    Attends Banker Meetings: 1 to 4 times per year    Marital Status: Divorced   Past Surgical History:  Procedure Laterality Date   ABDOMINAL HYSTERECTOMY  2004   BIOPSY  10/23/2019   Procedure: BIOPSY;  Surgeon: Shellia Cleverly, DO;  [facility]: [facility];  Service: Gastroenterology;;   CHOLECYSTECTOMY     COLONOSCOPY  12/19/2008   diverticulosis   COLONOSCOPY  11/17/2011   Procedure: COLONOSCOPY;  Surgeon: Erick Blinks, MD;  Location: [facility];  Service: Gastroenterology;  Laterality: N/A;   DILATION AND CURETTAGE OF UTERUS N/A 07/11/2014   Procedure: Repair of Vaginal Cuff;  Surgeon: Bing Plume, MD;  Location: [facility];  Service: Gynecology;  Laterality: N/A;   ESOPHAGOGASTRODUODENOSCOPY (EGD) WITH PROPOFOL N/A 10/23/2019   Procedure: ESOPHAGOGASTRODUODENOSCOPY (EGD) WITH PROPOFOL;  Surgeon: Shellia Cleverly, DO;  Location: [facility];  Service: Gastroenterology;  Laterality: N/A;   FLEXIBLE SIGMOIDOSCOPY  12/12/2008   diverticulosis   HAND SURGERY     x5 right   HYDRADENITIS EXCISION Right 03/21/2014   Procedure: EXCISION HIDRADENITIS AXILLA;  Surgeon: Valarie Merino, MD;  Location: [facility];  Service: General;  Laterality: Right;   KNEE ARTHROSCOPY     x2 left   NEPHRECTOMY  2013   left   TRIGGER FINGER RELEASE Left 06/23/2015   Procedure: RELEASE TRIGGER FINGER/A-1 PULLEY LEFT RING FINGER;  Surgeon: Cindee Salt, MD;  Location: [facility];  Service: Orthopedics;  Laterality: Left;   TUBAL LIGATION  1980   UPPER GASTROINTESTINAL ENDOSCOPY  12/12/2008   gastroporesis   XI ROBOTIC ASSISTED VENTRAL HERNIA N/A  01/21/2022   Procedure: ROBOTIC INCISIONAL HERNIA REPAIR WITH MESH, BILATERAL POSTERIOR RECTUS MYOFASCIAL RELEASE;  Surgeon: Quentin Ore, MD;  Location: [facility];  Service: General;  Laterality: N/A;   Past Medical History:  Diagnosis Date   Anemia    Blood transfusion 2010   associated with GI bleeding   CAD (coronary artery disease)    CHF (congestive heart failure) (HCC)    CKD (chronic kidney disease) stage 2, GFR 60-89 ml/min    Depression    Diverticulosis of colon with hemorrhage 2010, 09/2011   DM2 (diabetes mellitus, type 2) (HCC)    Gout 01/30/2023   Left Foot   H/O hiatal hernia    Helicobacter pylori gastritis 09/13/2013   Hemorrhagic stroke (HCC) 2019   Right Basal Ganglia   Hx: UTI (urinary tract infection)  Hypertension    Intraparenchymal hemorrhage of brain (HCC) 01/27/2023   Morbid obesity with BMI of 40.0-44.9, adult (HCC)    Personal history of colonic polyps    adenomas since 2010   PVD (peripheral vascular disease) (HCC)    Renal cell carcinoma 2004   lt kidney removed   Stroke (HCC) 12/2017   05/2021   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   Venous stasis    BP 133/79   Pulse 85   Ht 5\' 2"  (1.575 m)   Wt 227 lb 11.2 oz (103.3 kg)   LMP 11/16/2002   SpO2 97%   BMI 41.65 kg/m   Opioid Risk Score:   Fall Risk Score:  `1  Depression screen [facility] 2/9     03/01/2023   10:06 AM 05/28/2021    4:25 AM 12/30/2015    4:11 PM 12/17/2015    2:11 PM 12/01/2015   10:37 AM 10/26/2015    1:43 PM 04/22/2015    1:53 PM  Depression screen PHQ 2/9  Decreased Interest 0 0 0 0 0 0 0  Down, Depressed, Hopeless 0 0 0 0 0 1 0  PHQ - 2 Score 0 0 0 0 0 1 0  Altered sleeping 0        Tired, decreased energy 0        Change in appetite 0        Feeling bad or failure about yourself  0        Trouble concentrating 0        Moving slowly or fidgety/restless 0        Suicidal thoughts 0        PHQ-9 Score 0            Review of Systems  All other systems  reviewed and are negative.     Objective:   Physical Exam Vitals and nursing note reviewed.  Constitutional:      Appearance: She is obese.  HENT:     Head: Normocephalic and atraumatic.  Eyes:     Extraocular Movements: Extraocular movements intact.     Conjunctiva/sclera: Conjunctivae normal.     Pupils: Pupils are equal, round, and reactive to light.  Musculoskeletal:     Right lower leg: Edema present.     Left lower leg: Edema present.     Comments: Reduced AROM Left shoulder   Skin:    General: Skin is warm and dry.  Neurological:     Mental Status: She is alert and oriented to person, place, and time.     Motor: No tremor or abnormal muscle tone.     Coordination: Coordination abnormal. Impaired rapid alternating movements.     Gait: Gait is intact.     Comments: Dysdiadochokinesis LUE with rapid alternating supination and pronation   Psychiatric:        Mood and Affect: Mood normal.        Behavior: Behavior normal.    Normal LT sensation BUE and V1-2-3-       Assessment & Plan:    Recent Left thalamic IPH as well as more remote Right subcorical infarct with chronic mild left hemiparesis , good functional recovery after inpt rehab and HHPT.  No further need for OP therapy, Left sided deficits are chronic F/u Neuo No PMR f/u needed 2.  Hx HTN, LE swelling , BP controlled , f/u with PCP, may need med adjustment no overt signs of failure, ECHO with normal  systolic fxn, mild diastolic dysfunction

## 2023-04-06 NOTE — Patient Instructions (Signed)
Please make another appt with PCP Dr Everlene Other  Eye drops for allergies Patanol

## 2023-04-07 ENCOUNTER — Encounter: Payer: Self-pay | Admitting: Internal Medicine

## 2023-05-12 ENCOUNTER — Encounter: Payer: Self-pay | Admitting: Internal Medicine

## 2023-06-13 ENCOUNTER — Telehealth: Payer: Self-pay

## 2023-06-13 ENCOUNTER — Encounter: Payer: Self-pay | Admitting: Internal Medicine

## 2023-06-13 ENCOUNTER — Ambulatory Visit (AMBULATORY_SURGERY_CENTER): Payer: Medicare Other

## 2023-06-13 VITALS — Ht 62.0 in | Wt 219.0 lb

## 2023-06-13 DIAGNOSIS — Z1211 Encounter for screening for malignant neoplasm of colon: Secondary | ICD-10-CM

## 2023-06-13 NOTE — Progress Notes (Signed)

## 2023-06-13 NOTE — Telephone Encounter (Signed)
Patient came in for pre visit today and disclosed that she had a hospital admission on 01/31/23 for nontraumatic subcortical hemorrhage of left cerebral hemisphere. Colonoscopy is scheduled for 07/06/23 and pre visit is complete.

## 2023-06-14 NOTE — Telephone Encounter (Signed)
Pt was notified of the need to cancel the procedure  that was scheduled on 07/06/2023. Pt was cancelled. Pt made aware.  Please advise if you would like for me to schedule her directly to the Hospital  or add her to wait list.

## 2023-06-18 DIAGNOSIS — T884XXA Failed or difficult intubation, initial encounter: Secondary | ICD-10-CM

## 2023-06-18 HISTORY — DX: Failed or difficult intubation, initial encounter: T88.4XXA

## 2023-06-18 NOTE — Telephone Encounter (Signed)
We can set her up direct at hospital on future open date

## 2023-06-20 ENCOUNTER — Other Ambulatory Visit: Payer: Self-pay

## 2023-06-20 DIAGNOSIS — Z1211 Encounter for screening for malignant neoplasm of colon: Secondary | ICD-10-CM

## 2023-06-20 NOTE — Telephone Encounter (Signed)
Pt was made aware of Dr. Leone Payor recommendations.  Pt was scheduled for a Colonoscopy on 08/08/2023 at 10:15 AM  at [facility]. Pt made aware:  Pt was scheduled for a previsit on 07/07/2023 at 1:00 PM. Pt made aware.  Pt verbalized understanding with all questions answered.

## 2023-07-06 ENCOUNTER — Encounter: Payer: Medicare Other | Admitting: Internal Medicine

## 2023-07-07 ENCOUNTER — Ambulatory Visit: Payer: Medicare Other | Admitting: *Deleted

## 2023-07-07 ENCOUNTER — Telehealth: Payer: Self-pay | Admitting: *Deleted

## 2023-07-07 VITALS — Ht 62.0 in | Wt 220.0 lb

## 2023-07-07 DIAGNOSIS — Z1211 Encounter for screening for malignant neoplasm of colon: Secondary | ICD-10-CM

## 2023-07-07 NOTE — Telephone Encounter (Signed)
Hospital colon cancelled for 08/08/23. Will make MD aware.

## 2023-07-07 NOTE — Telephone Encounter (Signed)
Pt states she may be homeless by time of procedure.She want RN to do pre-visit but to cancel the procedure.

## 2023-07-07 NOTE — Progress Notes (Addendum)
Pt's name and DOB verified at the beginning of the pre-visit.  Pt denies any difficulty with ambulating,sitting, laying down or rolling side to side Gave both [facility] main # and MD on call # prior to instructions.  No egg or soy allergy known to patient  No issues known to pt with past sedation with any surgeries or procedures Pt denies having issues being intubated Pt has no issues moving head neck or swallowing No FH of Malignant Hyperthermia Pt is not on diet pills Pt is not on home 02  Pt is not on blood thinners  Pt denies issues with constipation  Pt is not on dialysis Pt denise any abnormal heart rhythms  Pt denies any upcoming cardiac testing Pt encouraged to use to use Singlecare or Goodrx to reduce cost  Patient's chart reviewed by Cathlyn Parsons CNRA prior to pre-visit and patient appropriate for the [facility].  Pre-visit completed and red dot placed by patient's name on their procedure day (on provider's schedule).  . Visit by phone Pt states weight is 220 lb Instructed pt why it is important to and  to call if they have any changes in health or new medications. Directed them to the # given and on instructions.   Pt states they will.  Instructions reviewed with pt and pt states understanding. Instructed to review Pt stated she wants the procedure canceled at this point due to her increased chance she will be homeless around time of procedure. Informed pt that pre-visit is good for 90 days and she desired to continue with the pre-visit. Pt states she has gotten in touch with resources to help her.

## 2023-07-09 NOTE — Telephone Encounter (Signed)
OK - have her let us know when situation changes,  She is on my list

## 2023-07-10 NOTE — Telephone Encounter (Signed)
Pt made aware of Dr. Leone Payor recommendations and made aware that she is on our wait list. Pt verbalized understanding with all questions answered.

## 2023-08-08 ENCOUNTER — Ambulatory Visit (HOSPITAL_COMMUNITY): Admit: 2023-08-08 | Payer: Medicare Other | Admitting: Internal Medicine

## 2023-08-08 ENCOUNTER — Encounter (HOSPITAL_COMMUNITY): Payer: Self-pay

## 2023-08-08 SURGERY — COLONOSCOPY WITH PROPOFOL
Anesthesia: Monitor Anesthesia Care

## 2024-03-12 NOTE — Therapy (Deleted)
 OUTPATIENT PHYSICAL THERAPY LOWER EXTREMITY EVALUATION   Patient Name: [name]  MRN: [id number] DOB:02/09/1956, 68 y.o., female Today's Date: 03/12/2024  END OF SESSION:   Past Medical History:  Diagnosis Date   Anemia    Blood transfusion 2010   associated with GI bleeding   CAD (coronary artery disease)    CKD (chronic kidney disease) stage 2, GFR 60-89 ml/min    Depression    Difficult intubation 06/18/2023   Diverticulosis of colon with hemorrhage 2010, 09/2011   DM2 (diabetes mellitus, type 2) (HCC)    Gout 01/30/2023   Left Foot   H/O hiatal hernia    Helicobacter pylori gastritis 09/13/2013   Hemorrhagic stroke (HCC) 2019   Right Basal Ganglia   Hx: UTI (urinary tract infection)    Hypertension    Intraparenchymal hemorrhage of brain (HCC) 01/27/2023   Morbid obesity with BMI of 40.0-44.9, adult (HCC)    Personal history of colonic polyps    adenomas since 2010   PVD (peripheral vascular disease) (HCC)    Renal cell carcinoma 2004   lt kidney removed   Stroke (HCC) 12/2017   05/2021   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   Venous stasis    Past Surgical History:  Procedure Laterality Date   ABDOMINAL HYSTERECTOMY  2004   BIOPSY  10/23/2019   Procedure: BIOPSY;  Surgeon: Annis Kinder, DO;  [facility]: [facility];  Service: Gastroenterology;;   CHOLECYSTECTOMY     COLONOSCOPY  12/19/2008   diverticulosis   COLONOSCOPY  11/17/2011   Procedure: COLONOSCOPY;  Surgeon: Laurell Pond, MD;  [facility]: [facility];  Service: Gastroenterology;  Laterality: N/A;   DILATION AND CURETTAGE OF UTERUS N/A 07/11/2014   Procedure: Repair of Vaginal Cuff;  Surgeon: Romilda Coaster, MD;  Location: [facility];  Service: Gynecology;  Laterality: N/A;   ESOPHAGOGASTRODUODENOSCOPY (EGD) WITH PROPOFOL  N/A 10/23/2019   Procedure: ESOPHAGOGASTRODUODENOSCOPY (EGD) WITH PROPOFOL ;  Surgeon: Annis Kinder, DO;  Location: [facility];  Service: Gastroenterology;   Laterality: N/A;   FLEXIBLE SIGMOIDOSCOPY  12/12/2008   diverticulosis   HAND SURGERY     x5 right   HYDRADENITIS EXCISION Right 03/21/2014   Procedure: EXCISION HIDRADENITIS AXILLA;  Surgeon: Azucena Bollard, MD;  Location: [facility];  Service: General;  Laterality: Right;   KNEE ARTHROSCOPY     x2 left   NEPHRECTOMY  2013   left   TRIGGER FINGER RELEASE Left 06/23/2015   Procedure: RELEASE TRIGGER FINGER/A-1 PULLEY LEFT RING FINGER;  Surgeon: Lyanne Sample, MD;  Location: [facility];  Service: Orthopedics;  Laterality: Left;   TUBAL LIGATION  1980   UPPER GASTROINTESTINAL ENDOSCOPY  12/12/2008   gastroporesis   XI ROBOTIC ASSISTED VENTRAL HERNIA N/A 01/21/2022   Procedure: ROBOTIC INCISIONAL HERNIA REPAIR WITH MESH, BILATERAL POSTERIOR RECTUS MYOFASCIAL RELEASE;  Surgeon: Junie Olds, MD;  Location: [facility];  Service: General;  Laterality: N/A;   Patient Active Problem List   Diagnosis Date Noted   Difficult intubation 06/18/2023   ICH (intracerebral hemorrhage) (HCC) 01/27/2023   Incisional hernia 01/21/2022   Anemia, normocytic normochromic 08/18/2021   CVA (cerebral vascular accident) (HCC) 05/19/2021   Trigger finger of right thumb 07/29/2020   Gastritis and gastroduodenitis 01/31/2020   Hiatal hernia 01/31/2020   AKI (acute kidney injury) (HCC) 10/20/2019   Hyperlipidemia    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/2018   CKD (chronic kidney disease) stage 2, GFR 60-89 ml/min 02/23/2017   Sepsis (HCC)  08/06/2016   Diverticulitis 08/06/2016   Diverticulitis of intestine without perforation or abscess without bleeding    Hyperlipidemia associated with type 2 diabetes mellitus (HCC) 07/19/2016   S/p nephrectomy 05/30/2016   IBS (irritable bowel syndrome) 05/30/2016   History of renal carcinoma 05/30/2016   Morbid obesity with BMI of 40.0-44.9, adult (HCC) 05/30/2016   Neuralgia of chest 12/30/2015   Atypical chest pain  12/01/2015   Physical exam 10/26/2015   Type 2 diabetes mellitus with stage 3 chronic kidney disease, without long-term current use of insulin  (HCC) 07/28/2015   Maxillary sinusitis, acute 01/21/2015   Strain of left pectoralis muscle 09/26/2014   Benign paroxysmal positional vertigo 10/03/2013   Nausea with vomiting 10/03/2013   Bleeding nose 10/03/2013   Helicobacter pylori gastritis 09/13/2013   Pyuria 08/20/2013   Abnormal abdominal CT scan 08/20/2013   Abdominal cramping 08/16/2013   Back pain 07/31/2013   LUQ pain 07/31/2013   Laceration of hand 07/10/2013   Hidradenitis suppurativa of right axilla 05/31/2013   Renal cell carcinoma (HCC)    Diverticulosis of colon with hemorrhage - recurrent 09/19/2011    Class: Acute   Diverticulosis of large intestine without perforation or abscess with bleeding 09/19/2011   OBESITY, MORBID 09/16/2009   Hypertension associated with diabetes (HCC) 06/23/2009   History of colonic polyps 12/12/2008    PCP: Alfredia Ina, MD  REFERRING PROVIDER: Alfredia Ina, MD  REFERRING DIAG:  Diagnosis  Z91.81 (ICD-10-CM) - At high risk for falls    THERAPY DIAG:  No diagnosis found.  Rationale for Evaluation and Treatment: Rehabilitation  ONSET DATE: ***  SUBJECTIVE:   SUBJECTIVE STATEMENT: ***  PERTINENT HISTORY: Depression, stroke, HTN PAIN:  Are you having pain? {OPRCPAIN:27236}  PRECAUTIONS: None  RED FLAGS: {PT Red Flags:29287}   WEIGHT BEARING RESTRICTIONS: No  FALLS:  Has patient fallen in last 6 months? {fallsyesno:27318}  LIVING ENVIRONMENT: Lives with: {OPRC lives with:25569::"lives with their family"} Lives in: {Lives in:25570} Stairs: {opstairs:27293} Has following equipment at home: {Assistive devices:23999}  OCCUPATION: ***  PLOF: Independent, Vocation/Vocational requirements: ***, and Leisure: ***  PATIENT GOALS: ***  NEXT MD VISIT: ***  OBJECTIVE:  Note: Objective measures were completed at  Evaluation unless otherwise noted.  DIAGNOSTIC FINDINGS: ***  PATIENT SURVEYS:  {rehab surveys:24030}  COGNITION: Overall cognitive status: Within functional limits for tasks assessed     SENSATION: WFL   MUSCLE LENGTH: Hamstrings: Right *** deg; Left *** deg Andy Bannister test: Right *** deg; Left *** deg  POSTURE: {posture:25561}  PALPATION: ***  LOWER EXTREMITY ROM:  {AROM/PROM:27142} ROM Right eval Left eval  Hip flexion    Hip extension    Hip abduction    Hip adduction    Hip internal rotation    Hip external rotation    Knee flexion    Knee extension    Ankle dorsiflexion    Ankle plantarflexion    Ankle inversion    Ankle eversion     (Blank rows = not tested)  LOWER EXTREMITY MMT:  MMT Right eval Left eval  Hip flexion    Hip extension    Hip abduction    Hip adduction    Hip internal rotation    Hip external rotation    Knee flexion    Knee extension    Ankle dorsiflexion    Ankle plantarflexion    Ankle inversion    Ankle eversion     (Blank rows = not tested)  LOWER EXTREMITY SPECIAL TESTS:  {LEspecialtests:26242}  FUNCTIONAL TESTS:  {Functional tests:24029}  GAIT: Distance walked: *** Assistive device utilized: {Assistive devices:23999} Level of assistance: {Levels of assistance:24026} Comments: ***                                                                                                                                TREATMENT DATE:  03/13/24 Findings from evaluation discussed, pt educated on plan of care, HEP initiated.      PATIENT EDUCATION:  Education details: *** Person educated: {Person educated:25204} Education method: {Education Method:25205} Education comprehension: {Education Comprehension:25206}  HOME EXERCISE PROGRAM: ***  ASSESSMENT:  CLINICAL IMPRESSION: Patient is a 68 y.o. female who was seen today for physical therapy evaluation and treatment for balance and falls risk.   OBJECTIVE IMPAIRMENTS:  {opptimpairments:25111}.   ACTIVITY LIMITATIONS: {activitylimitations:27494}  PARTICIPATION LIMITATIONS: {participationrestrictions:25113}  PERSONAL FACTORS: {Personal factors:25162} are also affecting patient's functional outcome.   REHAB POTENTIAL: Good  CLINICAL DECISION MAKING: Stable/uncomplicated  EVALUATION COMPLEXITY: {Evaluation complexity:25115}   GOALS: Goals reviewed with patient? Yes  SHORT TERM GOALS: Target date: 04/09/2024   Be independent in initial HEP Baseline: Goal status: INITIAL  2.  *** Baseline:  Goal status: INITIAL  3.  *** Baseline:  Goal status: INITIAL  4.  *** Baseline:  Goal status: INITIAL  5.  *** Baseline:  Goal status: INITIAL  6.  *** Baseline:  Goal status: INITIAL  LONG TERM GOALS: Target date: 05/07/2024    Be independent in advanced HEP Baseline:  Goal status: INITIAL  2.  *** Baseline:  Goal status: INITIAL  3.  *** Baseline:  Goal status: INITIAL  4.  *** Baseline:  Goal status: INITIAL  5.  *** Baseline:  Goal status: INITIAL  6.  *** Baseline:  Goal status: INITIAL   PLAN:  PT FREQUENCY: 2x/week  PT DURATION: 8 weeks  PLANNED INTERVENTIONS: {rehab planned interventions:25118::"97110-Therapeutic exercises","97530- Therapeutic [phone]- Neuromuscular re-education","97535- Self JXBJ","47829- Manual therapy"}  PLAN FOR NEXT SESSION: Luella Sager, PT 03/12/24 12:20 PM

## 2024-03-13 ENCOUNTER — Ambulatory Visit

## 2024-03-29 ENCOUNTER — Encounter (HOSPITAL_BASED_OUTPATIENT_CLINIC_OR_DEPARTMENT_OTHER): Payer: Self-pay | Admitting: Emergency Medicine

## 2024-03-29 ENCOUNTER — Other Ambulatory Visit: Payer: Self-pay

## 2024-03-29 ENCOUNTER — Observation Stay (HOSPITAL_BASED_OUTPATIENT_CLINIC_OR_DEPARTMENT_OTHER)
Admission: EM | Admit: 2024-03-29 | Discharge: 2024-04-01 | Disposition: A | Discharge status: Home / Self Care | Attending: Internal Medicine | Admitting: Internal Medicine

## 2024-03-29 DIAGNOSIS — R5383 Other fatigue: Secondary | ICD-10-CM | POA: Diagnosis present

## 2024-03-29 DIAGNOSIS — D649 Anemia, unspecified: Secondary | ICD-10-CM | POA: Diagnosis present

## 2024-03-29 DIAGNOSIS — Z7982 Long term (current) use of aspirin: Secondary | ICD-10-CM | POA: Insufficient documentation

## 2024-03-29 DIAGNOSIS — N183 Chronic kidney disease, stage 3 unspecified: Secondary | ICD-10-CM | POA: Diagnosis present

## 2024-03-29 DIAGNOSIS — N1832 Chronic kidney disease, stage 3b: Secondary | ICD-10-CM | POA: Diagnosis not present

## 2024-03-29 DIAGNOSIS — Z8673 Personal history of transient ischemic attack (TIA), and cerebral infarction without residual deficits: Secondary | ICD-10-CM | POA: Insufficient documentation

## 2024-03-29 DIAGNOSIS — Z85528 Personal history of other malignant neoplasm of kidney: Secondary | ICD-10-CM | POA: Diagnosis not present

## 2024-03-29 DIAGNOSIS — N179 Acute kidney failure, unspecified: Secondary | ICD-10-CM

## 2024-03-29 DIAGNOSIS — I129 Hypertensive chronic kidney disease with stage 1 through stage 4 chronic kidney disease, or unspecified chronic kidney disease: Secondary | ICD-10-CM | POA: Diagnosis not present

## 2024-03-29 DIAGNOSIS — E1122 Type 2 diabetes mellitus with diabetic chronic kidney disease: Secondary | ICD-10-CM | POA: Diagnosis present

## 2024-03-29 DIAGNOSIS — I161 Hypertensive emergency: Principal | ICD-10-CM | POA: Insufficient documentation

## 2024-03-29 DIAGNOSIS — E785 Hyperlipidemia, unspecified: Secondary | ICD-10-CM | POA: Diagnosis present

## 2024-03-29 LAB — CBC WITH DIFFERENTIAL/PLATELET
Abs Immature Granulocytes: 0.02 10*3/uL (ref 0.00–0.07)
Basophils Absolute: 0 10*3/uL (ref 0.0–0.1)
Basophils Relative: 0 %
Eosinophils Absolute: 0.2 10*3/uL (ref 0.0–0.5)
Eosinophils Relative: 3 %
HCT: 37.8 % (ref 36.0–46.0)
Hemoglobin: 12.1 g/dL (ref 12.0–15.0)
Immature Granulocytes: 0 %
Lymphocytes Relative: 38 %
Lymphs Abs: 2.7 10*3/uL (ref 0.7–4.0)
MCH: 29.5 pg (ref 26.0–34.0)
MCHC: 32 g/dL (ref 30.0–36.0)
MCV: 92.2 fL (ref 80.0–100.0)
Monocytes Absolute: 0.6 10*3/uL (ref 0.1–1.0)
Monocytes Relative: 8 %
Neutro Abs: 3.6 10*3/uL (ref 1.7–7.7)
Neutrophils Relative %: 51 %
Platelets: 234 10*3/uL (ref 150–400)
RBC: 4.1 MIL/uL (ref 3.87–5.11)
RDW: 14.3 % (ref 11.5–15.5)
WBC: 7.1 10*3/uL (ref 4.0–10.5)
nRBC: 0 % (ref 0.0–0.2)

## 2024-03-29 LAB — BASIC METABOLIC PANEL WITH GFR
Anion gap: 10 (ref 5–15)
BUN: 32 mg/dL — ABNORMAL HIGH (ref 8–23)
CO2: 24 mmol/L (ref 22–32)
Calcium: 8.9 mg/dL (ref 8.9–10.3)
Chloride: 109 mmol/L (ref 98–111)
Creatinine, Ser: 2.96 mg/dL — ABNORMAL HIGH (ref 0.44–1.00)
GFR, Estimated: 17 mL/min — ABNORMAL LOW (ref >60)
Glucose, Bld: 93 mg/dL (ref 70–99)
Potassium: 3.9 mmol/L (ref 3.5–5.1)
Sodium: 143 mmol/L (ref 135–145)

## 2024-03-29 LAB — URINALYSIS, ROUTINE W REFLEX MICROSCOPIC
Bacteria, UA: NONE SEEN
Bilirubin Urine: NEGATIVE
Glucose, UA: 100 mg/dL — AB
Ketones, ur: NEGATIVE mg/dL
Leukocytes,Ua: NEGATIVE
Nitrite: NEGATIVE
Protein, ur: >300 mg/dL — AB
Specific Gravity, Urine: 1.013 (ref 1.005–1.030)
pH: 7 (ref 5.0–8.0)

## 2024-03-29 LAB — TROPONIN T, HIGH SENSITIVITY
Troponin T High Sensitivity: 25 ng/L — ABNORMAL HIGH (ref <19)
Troponin T High Sensitivity: 25 ng/L — ABNORMAL HIGH (ref <19)

## 2024-03-29 LAB — CBG MONITORING, ED: Glucose-Capillary: 122 mg/dL — ABNORMAL HIGH (ref 70–99)

## 2024-03-29 MED ORDER — HYDRALAZINE HCL 10 MG PO TABS
10.0000 mg | ORAL_TABLET | Freq: Once | ORAL | Status: AC
  Administered 2024-03-29: 10 mg via ORAL
  Filled 2024-03-29: qty 1

## 2024-03-29 MED ORDER — HYDRALAZINE HCL 20 MG/ML IJ SOLN
10.0000 mg | Freq: Once | INTRAMUSCULAR | Status: AC
  Administered 2024-03-30: 10 mg via INTRAVENOUS
  Filled 2024-03-29: qty 1

## 2024-03-29 NOTE — ED Notes (Signed)
 Pt aware of the need for a urine... Unable to currently provide a sample.Monica KitchenMarland Benson

## 2024-03-29 NOTE — Plan of Care (Signed)
 Plan of Care Note for accepted transfer   Patient name: [name]  ZOX:[id number] DOB: [birth year]  Facility requesting transfer: [facility] Requesting Provider: Tama Fails, PA-C Reason for transfer: Hypertensive emergency, AKI Facility course: 68 year old female with history of hypertension, hyperlipidemia, GERD, gout, hiatal hernia, history of GI bleed, diabetes, CKD stage II, class III obesity, history of hemorrhagic stroke, CAD, PVD, renal cell carcinoma presented to the ED with a chief complaint of fatigue in the setting of not taking her diabetes medications for the past week.  Hypertensive with systolic in the 200s and diastolic in the 130s.  Slightly tachycardic.  Labs notable for glucose 93, BUN 132, creatinine 2.9 (baseline 1.4-1.6), UA without signs of infection, troponin 25> 25. No chest pain and EKG without acute ischemic changes. Patient was given IV hydralazine  10 mg x 2 and IV labetalol  20 mg.  Blood pressure had remained elevated so case was discussed with critical care and recommendation was to give p.o. metoprolol  100 mg and amlodipine  10 mg.  Blood pressure now improved with systolic in the 160s and diastolic in the 90s.  Plan of care: The patient is accepted for admission to Progressive unit at [facility].  [facility] will assume care on arrival to accepting facility. Until arrival, care as per EDP. However, [facility] available 24/7 for questions and assistance.  Check [email or web address] for on-call coverage.  Nursing staff, please call [facility] Admits & Consults System-Wide number under Amion on patient's arrival so appropriate admitting provider can evaluate the pt.

## 2024-03-29 NOTE — ED Provider Notes (Signed)
 [place] EMERGENCY DEPARTMENT AT [facility] Provider Note   CSN: [id number] Arrival date & time: 03/29/24  1841     History  No chief complaint on file.   [name]  is a 68 y.o. female who presents to the emergency department with a chief complaint of fatigue.  Patient has not taken her diabetes medications for the past week because she was prescribed Trulicity when her insurance would no longer pay for her previous medication.  She was afraid to take her medicine.  She has a history of kidney issues.  She is also under stress due to finding out her sister has stage IV cancer today.  She denies any other symptoms today.  She denies chest pain or shortness of breath.  This has been going on for a week since she stopped taking her medicines.  HPI     Home Medications Prior to Admission medications   Medication Sig Start Date End Date Taking? Authorizing Provider  acetaminophen  (TYLENOL ) 325 MG tablet Take 2 tablets (650 mg total) by mouth every 4 (four) hours as needed for mild pain (or temp > 37.5 C (99.5 F)). Patient not taking: Reported on 06/13/2023 01/31/23   de Thayne Fine, Cortney E, NP  albuterol  (VENTOLIN  HFA) 108 (90 Base) MCG/ACT inhaler Inhale 2 puffs into the lungs every 6 (six) hours as needed for wheezing. 02/10/23   Angiulli, Everlyn Hockey, PA-C  amLODipine  (NORVASC ) 10 MG tablet Take 1 tablet (10 mg total) by mouth daily. Patient not taking: Reported on 07/07/2023 02/10/23   Angiulli, Everlyn Hockey, PA-C  aspirin  EC 81 MG tablet Take 1 tablet (81 mg total) by mouth daily. Swallow whole. 02/10/23   Angiulli, Everlyn Hockey, PA-C  atorvastatin  (LIPITOR) 20 MG tablet Take by mouth. 03/22/22   [provider]  azelastine  (ASTELIN ) 0.1 % nasal spray Place into the nose.    [provider]  BYDUREON  BCISE 2 MG/0.85ML AUIJ INJECT 2 MG INTO THE SKIN ONCE A WEEK 05/20/20   [provider]  carvedilol  (COREG ) 12.5 MG tablet Take 1 tablet (12.5 mg total) by mouth  2 (two) times daily with a meal. 02/10/23   Angiulli, Everlyn Hockey, PA-C  carvedilol  (COREG ) 12.5 MG tablet Take by mouth. 02/27/23   [provider]  carvedilol  (COREG ) 6.25 MG tablet Take 6.25 mg by mouth 2 (two) times daily.    [provider]  colchicine  0.6 MG tablet Take 1 tablet (0.6 mg total) by mouth daily. Patient not taking: Reported on 06/13/2023 02/10/23   Angiulli, Daniel J, PA-C  Multiple Vitamin (MULTIVITAMIN) capsule Take 1 capsule by mouth daily.    [provider]      Allergies    Patient has no known allergies.    Review of Systems   Review of Systems  Physical Exam Updated Vital Signs BP (!) 203/100 (BP Location: Right Arm)   Pulse (!) 102   Temp 98 F (36.7 C)   Resp 18   Ht 5\' 2"  (1.575 m)   Wt 96.2 kg   LMP 11/16/2002   SpO2 100%   BMI 38.78 kg/m  Physical Exam Vitals and nursing note reviewed.  Constitutional:      General: She is not in acute distress.    Appearance: She is well-developed. She is not diaphoretic.  HENT:     Head: Normocephalic and atraumatic.     Right Ear: External ear normal.     Left Ear: External ear normal.  Nose: Nose normal.     Mouth/Throat:     Mouth: Mucous membranes are moist.  Eyes:     General: No scleral icterus.    Conjunctiva/sclera: Conjunctivae normal.  Cardiovascular:     Rate and Rhythm: Normal rate and regular rhythm.     Heart sounds: Normal heart sounds. No murmur heard.    No friction rub. No gallop.  Pulmonary:     Effort: Pulmonary effort is normal. No respiratory distress.     Breath sounds: Normal breath sounds.  Abdominal:     General: Bowel sounds are normal. There is no distension.     Palpations: Abdomen is soft. There is no mass.     Tenderness: There is no abdominal tenderness. There is no guarding.  Musculoskeletal:     Cervical back: Normal range of motion.  Skin:    General: Skin is warm and dry.  Neurological:     Mental Status: She is alert and oriented to  person, place, and time.  Psychiatric:        Behavior: Behavior normal.     ED Results / Procedures / Treatments   Labs (all labs ordered are listed, but only abnormal results are displayed) Labs Reviewed  BASIC METABOLIC PANEL WITH GFR - Abnormal; Notable for the following components:      Result Value   BUN 32 (*)    Creatinine, Ser 2.96 (*)    GFR, Estimated 17 (*)    All other components within normal limits  URINALYSIS, ROUTINE W REFLEX MICROSCOPIC - Abnormal; Notable for the following components:   Color, Urine COLORLESS (*)    Glucose, UA 100 (*)    Hgb urine dipstick TRACE (*)    Protein, ur >300 (*)    All other components within normal limits  CBG MONITORING, ED - Abnormal; Notable for the following components:   Glucose-Capillary 122 (*)    All other components within normal limits  CBC WITH DIFFERENTIAL/PLATELET    EKG None  Radiology No results found.  Procedures .Critical Care  Performed by: Tama Fails, PA-C Authorized by: Tama Fails, PA-C   Critical care provider statement:    Critical care time (minutes):  60   Critical care time was exclusive of:  Separately billable procedures and treating other patients   Critical care was necessary to treat or prevent imminent or life-threatening deterioration of the following conditions:  Renal failure and circulatory failure   Critical care was time spent personally by me on the following activities:  Development of treatment plan with patient or surrogate, discussions with consultants, evaluation of patient's response to treatment, examination of patient, ordering and review of laboratory studies, ordering and review of radiographic studies, ordering and performing treatments and interventions, pulse oximetry, re-evaluation of patient's condition, review of old charts and interpretation of cardiac output measurements     Medications Ordered in ED Medications - No data to display  ED Course/ Medical  Decision Making/ A&P Clinical Course as of 04/01/24 1523  Fri Mar 29, 2024  2101 Creatinine(!): 2.96 Last recorded cr 1.76 12/05/2023 through [facility]. Todayis 2.96 BP is 203/100   [AH]  2327 Basic metabolic panel(!) [AH]    Clinical Course User Index [AH] Tama Fails, PA-C                                 Medical Decision Making Patient here with Fatigue. The differential diagnosis  of weakness includes but is not limited to neurologic causes (GBS, myasthenia gravis, CVA, MS, ALS, transverse myelitis, spinal cord injury, CVA, botulism, ) and other causes: ACS, Arrhythmia, syncope, orthostatic hypotension, sepsis, hypoglycemia, electrolyte disturbance, hypothyroidism, respiratory failure, symptomatic anemia, dehydration, heat injury, polypharmacy, malignancy.  Comorbidities include HTN, DM  Labs reviewed as per the ED course, AKI, proteinuria, elevated troponin  Given fluids and IV antihypertensives. She has not yet responded to them. Patient will need admission  Hand off to Dr. Bolivar Bushman at shift change.    Amount and/or Complexity of Data Reviewed Labs: ordered. Decision-making details documented in ED Course.  Risk OTC drugs. Prescription drug management. Decision regarding hospitalization.           Final Clinical Impression(s) / ED Diagnoses Final diagnoses:  None    Rx / DC Orders ED Discharge Orders     None         Tama Fails, PA-C 04/01/24 1528    Rolinda Climes, DO 04/01/24 1600

## 2024-03-29 NOTE — ED Notes (Signed)
 Called [facility] @ 1113pm to check on previous consult.

## 2024-03-29 NOTE — ED Triage Notes (Signed)
 Pt [name], ambulatory reports her PCP switched her diabetic med and since she does not feel comfortable taking the med she has gone 1-2 wks without her diabetic med and has been feeling tired and weak since yesterday.

## 2024-03-30 DIAGNOSIS — I161 Hypertensive emergency: Principal | ICD-10-CM

## 2024-03-30 DIAGNOSIS — I10 Essential (primary) hypertension: Secondary | ICD-10-CM | POA: Diagnosis not present

## 2024-03-30 DIAGNOSIS — R0902 Hypoxemia: Secondary | ICD-10-CM | POA: Diagnosis not present

## 2024-03-30 DIAGNOSIS — R531 Weakness: Secondary | ICD-10-CM | POA: Diagnosis not present

## 2024-03-30 DIAGNOSIS — Z7401 Bed confinement status: Secondary | ICD-10-CM | POA: Diagnosis not present

## 2024-03-30 DIAGNOSIS — Z8673 Personal history of transient ischemic attack (TIA), and cerebral infarction without residual deficits: Secondary | ICD-10-CM

## 2024-03-30 DIAGNOSIS — N179 Acute kidney failure, unspecified: Secondary | ICD-10-CM

## 2024-03-30 LAB — VITAMIN B12: Vitamin B-12: 373 pg/mL (ref 180–914)

## 2024-03-30 LAB — GLUCOSE, CAPILLARY
Glucose-Capillary: 101 mg/dL — ABNORMAL HIGH (ref 70–99)
Glucose-Capillary: 134 mg/dL — ABNORMAL HIGH (ref 70–99)

## 2024-03-30 LAB — TSH: TSH: 1.392 u[IU]/mL (ref 0.350–4.500)

## 2024-03-30 MED ORDER — ACETAMINOPHEN 650 MG RE SUPP: 650.0000 mg | Freq: Four times a day (QID) | RECTAL | Status: DC | PRN

## 2024-03-30 MED ORDER — ACETAMINOPHEN 500 MG PO TABS
1000.0000 mg | ORAL_TABLET | Freq: Once | ORAL | Status: AC
  Administered 2024-03-30: 1000 mg via ORAL
  Filled 2024-03-30: qty 2

## 2024-03-30 MED ORDER — ASPIRIN 81 MG PO TBEC
81.0000 mg | DELAYED_RELEASE_TABLET | Freq: Every day | ORAL | Status: DC
  Administered 2024-03-30 – 2024-04-01 (×3): 81 mg via ORAL
  Filled 2024-03-30 (×3): qty 1

## 2024-03-30 MED ORDER — ONDANSETRON HCL 4 MG PO TABS: 4.0000 mg | ORAL_TABLET | Freq: Four times a day (QID) | ORAL | Status: DC | PRN

## 2024-03-30 MED ORDER — METOPROLOL TARTRATE 25 MG PO TABS
100.0000 mg | ORAL_TABLET | Freq: Once | ORAL | Status: AC
  Administered 2024-03-30: 100 mg via ORAL
  Filled 2024-03-30: qty 4

## 2024-03-30 MED ORDER — ACETAMINOPHEN 325 MG PO TABS: 650.0000 mg | ORAL_TABLET | Freq: Four times a day (QID) | ORAL | Status: DC | PRN

## 2024-03-30 MED ORDER — HYDRALAZINE HCL 10 MG PO TABS
10.0000 mg | ORAL_TABLET | Freq: Two times a day (BID) | ORAL | Status: DC
  Administered 2024-03-30 – 2024-03-31 (×3): 10 mg via ORAL
  Filled 2024-03-30 (×3): qty 1

## 2024-03-30 MED ORDER — AMLODIPINE BESYLATE 10 MG PO TABS
10.0000 mg | ORAL_TABLET | Freq: Every day | ORAL | Status: DC
  Administered 2024-03-31 – 2024-04-01 (×2): 10 mg via ORAL
  Filled 2024-03-30 (×2): qty 1

## 2024-03-30 MED ORDER — ACETAMINOPHEN 325 MG PO TABS
650.0000 mg | ORAL_TABLET | Freq: Once | ORAL | Status: AC
  Administered 2024-03-30: 650 mg via ORAL
  Filled 2024-03-30: qty 2

## 2024-03-30 MED ORDER — ORAL CARE MOUTH RINSE: 15.0000 mL | OROMUCOSAL | Status: DC | PRN

## 2024-03-30 MED ORDER — ENSURE ENLIVE PO LIQD: 237.0000 mL | Freq: Two times a day (BID) | ORAL | Status: DC

## 2024-03-30 MED ORDER — INSULIN ASPART 100 UNIT/ML IJ SOLN
0.0000 [IU] | Freq: Three times a day (TID) | INTRAMUSCULAR | Status: DC
  Administered 2024-03-31: 1 [IU] via SUBCUTANEOUS

## 2024-03-30 MED ORDER — LABETALOL HCL 5 MG/ML IV SOLN
20.0000 mg | Freq: Once | INTRAVENOUS | Status: AC
  Administered 2024-03-30: 20 mg via INTRAVENOUS
  Filled 2024-03-30: qty 4

## 2024-03-30 MED ORDER — LABETALOL HCL 5 MG/ML IV SOLN
5.0000 mg | Freq: Three times a day (TID) | INTRAVENOUS | Status: DC | PRN
  Administered 2024-03-31: 5 mg via INTRAVENOUS
  Filled 2024-03-30: qty 4

## 2024-03-30 MED ORDER — ONDANSETRON HCL 4 MG/2ML IJ SOLN
4.0000 mg | Freq: Four times a day (QID) | INTRAMUSCULAR | Status: DC | PRN
Start: 2024-03-30 — End: 2024-04-01

## 2024-03-30 MED ORDER — HYDRALAZINE HCL 25 MG PO TABS
25.0000 mg | ORAL_TABLET | Freq: Four times a day (QID) | ORAL | Status: DC | PRN
  Administered 2024-03-30: 25 mg via ORAL
  Filled 2024-03-30: qty 1

## 2024-03-30 MED ORDER — AMLODIPINE BESYLATE 5 MG PO TABS
10.0000 mg | ORAL_TABLET | Freq: Once | ORAL | Status: AC
  Administered 2024-03-30: 10 mg via ORAL
  Filled 2024-03-30: qty 2

## 2024-03-30 NOTE — Assessment & Plan Note (Addendum)
 On no statin, she was on this but stopped it because she didn't like how it made her feel Discussed she needs to discuss with her PCP with her hx of multiple CVAs/HTN

## 2024-03-30 NOTE — ED Notes (Signed)
 [facility] called for transport, pt bed assignment is ready

## 2024-03-30 NOTE — H&P (Signed)
 History and Physical    Patient: [name]  ZOX:[id number] DOB: 1956-10-07 DOA: 03/29/2024 DOS: the patient was seen and examined on 03/30/2024 PCP: Alfredia Ina, MD  Patient coming from: [facility] - lives alone. Ambulates independently, but at times needs cane/walker.    Chief Complaint: fatigue/light headed and weak   HPI: [name]  is a 68 y.o. female with medical history significant of T2DM, hx of ischemic and hemorrhagic CVA, HTN, HLD, hx of RCC s/p nephrectomy, venous stasis, gout, PVD who presented to Ed with complaints of fatigue in setting of not taking her diabetic medication for past week. She tells me she was on Bydureon  for her diabetes, but this is no longer available. She stopped this about one week ago. Thursday she started to feel light headed and tired. She also couldn't remember things well. She felt weak and got easily fatigued just putting dishes in the sink. This is very abnormal for her. Her sister encouraged her to go to ED. She had headaches yesterday, but attributed this to not eating since she hadn't eaten since Thursday.   She is only on hydrochlorothiazide  12.5mg  and she has not been taking this medication since march of 2025. She does take her blood pressure at home and it's average systolic 140/90.   Denies any fever/chills, vision changes/headaches, chest pain or palpitations, shortness of breath or cough, abdominal pain, N/V/D, dysuria or leg swelling.    She does not smoke or drink alcohol.   ER Course:  vitals: afebrile, bp: 195/92, HR: 52, RR: 18, oxygen: 94%RA Pertinent labs: BUN: 2.96, creatinine: 2.96, troponin 25>25 In ED: given tylenol , norvasc , labetalol , metoprolol  and hydralazine .    Review of Systems: As mentioned in the history of present illness. All other systems reviewed and are negative. 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   Morbid obesity with BMI of 40.0-44.9, adult (HCC)    Personal history of colonic polyps    adenomas since 2010   PVD (peripheral vascular disease) (HCC)    Renal cell carcinoma 2004   lt kidney removed   Stroke (HCC) 12/2017   05/2021   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   Venous stasis    Past Surgical History:  Procedure Laterality Date   ABDOMINAL HYSTERECTOMY  2004   BIOPSY  10/23/2019   Procedure: BIOPSY;  Surgeon: Annis Kinder, DO;  Location: [facility];  Service: Gastroenterology;;   CHOLECYSTECTOMY     COLONOSCOPY  12/19/2008   diverticulosis   COLONOSCOPY  11/17/2011   Procedure: COLONOSCOPY;  Surgeon: Laurell Pond, MD;  Location: [facility];  Service: Gastroenterology;  Laterality: N/A;   DILATION AND CURETTAGE OF UTERUS N/A 07/11/2014   Procedure: Repair of Vaginal Cuff;  Surgeon: Romilda Coaster, MD;  Location: [facility];  Service: Gynecology;  Laterality: N/A;   ESOPHAGOGASTRODUODENOSCOPY (EGD) WITH PROPOFOL  N/A  10/23/2019   Procedure: ESOPHAGOGASTRODUODENOSCOPY (EGD) WITH PROPOFOL ;  Surgeon: Annis Kinder, DO;  Location: [facility];  Service: Gastroenterology;  Laterality: N/A;   FLEXIBLE SIGMOIDOSCOPY  12/12/2008   diverticulosis   HAND SURGERY     x5 right   HYDRADENITIS EXCISION Right 03/21/2014   Procedure: EXCISION HIDRADENITIS AXILLA;  Surgeon: Azucena Bollard, MD;  Location: [facility];  Service: General;  Laterality: Right;   KNEE ARTHROSCOPY     x2 left    NEPHRECTOMY  2013   left   TRIGGER FINGER RELEASE Left 06/23/2015   Procedure: RELEASE TRIGGER FINGER/A-1 PULLEY LEFT RING FINGER;  Surgeon: Lyanne Sample, MD;  Location: [facility];  Service: Orthopedics;  Laterality: Left;   TUBAL LIGATION  1980   UPPER GASTROINTESTINAL ENDOSCOPY  12/12/2008   gastroporesis   XI ROBOTIC ASSISTED VENTRAL HERNIA N/A 01/21/2022   Procedure: ROBOTIC INCISIONAL HERNIA REPAIR WITH MESH, BILATERAL POSTERIOR RECTUS MYOFASCIAL RELEASE;  Surgeon: Junie Olds, MD;  Location: [facility];  Service: General;  Laterality: N/A;   Social History:  reports that she has never smoked. She has never used smokeless tobacco. She reports that she does not drink alcohol and does not use drugs.  No Known Allergies  Family History  Problem Relation Age of Onset   Colon polyps Mother    Diabetes Father    Diabetes Maternal Grandmother    Breast cancer Maternal Grandmother    Breast cancer Daughter    Colon cancer Neg Hx    Esophageal cancer Neg Hx    Rectal cancer Neg Hx    Stomach cancer Neg Hx     Prior to Admission medications   Medication Sig Start Date End Date Taking? Authorizing Provider  azelastine  (ASTELIN ) 0.1 % nasal spray Place 1 spray into both nostrils 2 (two) times daily as needed for rhinitis or allergies.   Yes [provider]  BYDUREON  BCISE 2 MG/0.85ML AUIJ INJECT 2 MG INTO THE SKIN ONCE A WEEK 05/20/20  Yes [provider]  hydrochlorothiazide  (HYDRODIURIL ) 12.5 MG tablet Take 12.5 mg by mouth daily.   Yes [provider]  furosemide (LASIX) 20 MG tablet Take 20 mg by mouth daily. Patient not taking: Reported on 03/30/2024 03/25/24   [provider]  potassium chloride  (KLOR-CON  M) 10 MEQ tablet Take 10 mEq by mouth daily. Patient not taking: Reported on 03/30/2024 03/25/24   [provider]  TRULICITY 0.75 MG/0.5ML SOAJ Inject 0.75 mg into the skin once a week. Patient not taking: Reported on  03/30/2024 03/21/24   [provider]    Physical Exam: Vitals:   03/30/24 0800 03/30/24 0900 03/30/24 1035 03/30/24 1137  BP: (!) 151/72  137/76 (!) 160/93  Pulse: 78  87 78  Resp: 14  17 18   Temp:  98.2 F (36.8 C)  98.4 F (36.9 C)  TempSrc:  Oral  Oral  SpO2: 97%  98% 100%  Weight:    96.7 kg  Height:    5\' 2"  (1.575 m)   General:  Appears calm and comfortable and is in NAD. Obese  Eyes:  PERRL, EOMI, normal lids, iris ENT:  grossly normal hearing, lips & tongue, mmm; appropriate dentition Neck:  no LAD, masses or thyromegaly; no carotid bruits, no JVD Cardiovascular:  RRR, no m/r/g. 1+ LE edema bilaterally  Respiratory:   CTA bilaterally with no wheezes/rales/rhonchi.  Normal respiratory effort. Abdomen:  soft, NT, ND, NABS Back:   normal alignment, no CVAT  Skin:  no rash or induration seen on limited exam Musculoskeletal:  grossly normal tone BUE/BLE, good ROM, no bony abnormality Lower extremity:  No LE edema.  Limited foot exam with no ulcerations.  2+ distal pulses. Psychiatric:  grossly normal mood and affect, speech fluent and appropriate, AOx3 Neurologic:  CN 2-12 grossly intact, moves all extremities in coordinated fashion, sensation intact   Radiological Exams on Admission: Independently reviewed - see discussion in A/P where applicable  No results found.  EKG: Independently reviewed.  NSR with rate 94; nonspecific ST changes with no evidence of acute ischemia   Labs on Admission: I have personally reviewed the available labs and imaging studies at the time of the admission.  Pertinent labs:   BUN: 2.96,  creatinine: 2.96,  troponin 25>25  Assessment and Plan: Principal Problem:   Hypertensive emergency Active Problems:   Acute renal failure superimposed on stage 3b chronic kidney disease (HCC)   Type 2 diabetes mellitus with stage 3 chronic kidney disease, without long-term current use of insulin  (HCC)   History of CVA (cerebrovascular  accident)   Hyperlipidemia   History of renal cell cancer    Assessment and Plan: * Hypertensive emergency /68 year old female presenting to ED with complaints of fatigue, weakness and some light headedness x 2 days found to have hypertensive emergency with presenting pressures of 195/92 and acute on chronic kidney injury.  -obs to tele  -given IV labetalol , hydralazine , norvasc  and metoprolol  with improvement in pressures -she has been non compliant with her home hydrochlorothiazide  12.5mg  and has not been taking since march 2025  -she states home readings have been 140/90s  -will start her on norvasc  10mg  daily and hydralazine  10mg  BID for now with PRN parameters -ideally would do well on ARB/ACEI if renal function allows -AKI on CKD, see #2, she is followed by nephrology  -troponin flat at 25 with no changes on EKG. She has no chest pain and likely troponin leak from HTN emergency/renal disease  -? OSA, has sleep study scheduled with [facility]  -will need titration, close follow up with PCP and discussed need to be compliant with medication   Acute renal failure superimposed on stage 3b chronic kidney disease (HCC) Baseline creatinine from outside labs of 1.7 in January of 2025 Presenting with creatinine of 2.96 in setting of HTN emergency Check UA Avoid nephrotoxic drugs Strict I/O Slow blood pressure control  Trend   Type 2 diabetes mellitus with stage 3 chronic kidney disease, without long-term current use of insulin  (HCC) Recent A1C of 5.7 in April of 2025.  On Bydureon , but no longer available. Off x 1 week  Likely could do trial off medication with such tight control and see if she can control with diet/lifestyle changes SSI and accucheck QAC/HS, sensitive scale with AKI on CKD   History of CVA (cerebrovascular accident) Hx of both ischemic and hemorrhagic CVAs -stopped her ASA on her own accord, will start this back. Had discussion with her -stopped statin, but she states  she didn't like how they made her feel. Encouraged her to f/u with her PCP -discussed importance of blood pressure control, she has been noncompliant with her hydrochlorothiazide     Hyperlipidemia On no statin, she was on this but stopped it because she didn't like how it made her feel Discussed she needs to discuss with her PCP with her hx of multiple CVAs/HTN   History of renal cell cancer S/p left nephrectomy in 2004 by [facility]  Advance Care Planning:   Code Status: Full Code   Consults: none   DVT Prophylaxis: TED/SCDs   Family Communication: none   Severity of Illness: The appropriate patient status for this patient is OBSERVATION. Observation status is judged to be reasonable and necessary in order to provide the required intensity of service to ensure the patient's safety. The patient's presenting symptoms, physical exam findings, and initial radiographic and laboratory data in the context of their medical condition is felt to place them at decreased risk for further clinical deterioration. Furthermore, it is anticipated that the patient will be medically stable for discharge from the hospital within 2 midnights of admission.   Author: Raymona Caldwell, MD 03/30/2024 2:02 PM  For on call review [email or web address].

## 2024-03-30 NOTE — Assessment & Plan Note (Addendum)
 Recent A1C of 5.7 in April of 2025.  On Bydureon , but no longer available. Off x 1 week  Likely could do trial off medication with such tight control and see if she can control with diet/lifestyle changes SSI and accucheck QAC/HS, sensitive scale with AKI on CKD

## 2024-03-30 NOTE — Assessment & Plan Note (Signed)
 Baseline creatinine from outside labs of 1.7 in January of 2025 Presenting with creatinine of 2.96 in setting of HTN emergency Check UA Avoid nephrotoxic drugs Strict I/O Slow blood pressure control  Trend

## 2024-03-30 NOTE — Assessment & Plan Note (Signed)
 Hx of both ischemic and hemorrhagic CVAs -stopped her ASA on her own accord, will start this back. Had discussion with her -stopped statin, but she states she didn't like how they made her feel. Encouraged her to f/u with her PCP -discussed importance of blood pressure control, she has been noncompliant with her hydrochlorothiazide 

## 2024-03-30 NOTE — ED Notes (Signed)
 Called Bed Placement, spoke to [facility]. Awaiting dc for pt to get a bed assignment

## 2024-03-30 NOTE — Plan of Care (Signed)

## 2024-03-30 NOTE — ED Notes (Signed)
 I have given report to [facility] and I have given report to: Larinda Plover, RN, CN at [facility] [address].

## 2024-03-30 NOTE — ED Provider Notes (Addendum)
 12:51 AM  Patient's BP not significantly changed with hydralazine . Given a dose of labetalol  with good response. Dr. Michell Ahumada informed via secure chat    2:22 AM Dr. Michell Ahumada has responded and is concerned that BP is increasing again. She requested I speak with ICU. I spoke with Dr. Mason Sole, who recommended giving metoprolol  100mg  and amlodipine  10mg  PO and reassess for improvement.   4:00 AM BP improving with oral medications. Patient resting comfortably. Dr. Michell Ahumada informed via secure chat and she will place a bed request.      Charmayne Cooper, MD 03/30/24 0400

## 2024-03-30 NOTE — Assessment & Plan Note (Signed)
 S/p left nephrectomy in 2004 by [facility]

## 2024-03-30 NOTE — Assessment & Plan Note (Signed)
/  68 year old female presenting to ED with complaints of fatigue, weakness and some light headedness x 2 days found to have hypertensive emergency with presenting pressures of 195/92 and acute on chronic kidney injury.  -obs to tele  -given IV labetalol , hydralazine , norvasc  and metoprolol  with improvement in pressures -she has been non compliant with her home hydrochlorothiazide  12.5mg  and has not been taking since march 2025  -she states home readings have been 140/90s  -will start her on norvasc  10mg  daily and hydralazine  10mg  BID for now with PRN parameters -ideally would do well on ARB/ACEI if renal function allows -AKI on CKD, see #2, she is followed by nephrology  -troponin flat at 25 with no changes on EKG. She has no chest pain and likely troponin leak from HTN emergency/renal disease  -? OSA, has sleep study scheduled with [facility]  -will need titration, close follow up with PCP and discussed need to be compliant with medication

## 2024-03-31 ENCOUNTER — Observation Stay (HOSPITAL_COMMUNITY)

## 2024-03-31 DIAGNOSIS — I161 Hypertensive emergency: Secondary | ICD-10-CM | POA: Diagnosis not present

## 2024-03-31 LAB — GLUCOSE, CAPILLARY
Glucose-Capillary: 235 mg/dL — ABNORMAL HIGH (ref 70–99)
Glucose-Capillary: 111 mg/dL — ABNORMAL HIGH (ref 70–99)
Glucose-Capillary: 125 mg/dL — ABNORMAL HIGH (ref 70–99)
Glucose-Capillary: 126 mg/dL — ABNORMAL HIGH (ref 70–99)

## 2024-03-31 LAB — CBC
HCT: 36.1 % (ref 36.0–46.0)
Hemoglobin: 11.7 g/dL — ABNORMAL LOW (ref 12.0–15.0)
MCH: 29 pg (ref 26.0–34.0)
MCHC: 32.4 g/dL (ref 30.0–36.0)
MCV: 89.6 fL (ref 80.0–100.0)
Platelets: 239 10*3/uL (ref 150–400)
RBC: 4.03 MIL/uL (ref 3.87–5.11)
RDW: 14.3 % (ref 11.5–15.5)
WBC: 6.2 10*3/uL (ref 4.0–10.5)
nRBC: 0 % (ref 0.0–0.2)

## 2024-03-31 LAB — BASIC METABOLIC PANEL WITH GFR
Anion gap: 6 (ref 5–15)
BUN: 32 mg/dL — ABNORMAL HIGH (ref 8–23)
CO2: 24 mmol/L (ref 22–32)
Calcium: 8 mg/dL — ABNORMAL LOW (ref 8.9–10.3)
Chloride: 109 mmol/L (ref 98–111)
Creatinine, Ser: 3.02 mg/dL — ABNORMAL HIGH (ref 0.44–1.00)
GFR, Estimated: 16 mL/min — ABNORMAL LOW (ref >60)
Glucose, Bld: 92 mg/dL (ref 70–99)
Potassium: 4.1 mmol/L (ref 3.5–5.1)
Sodium: 139 mmol/L (ref 135–145)

## 2024-03-31 LAB — HIV ANTIBODY (ROUTINE TESTING W REFLEX): HIV Screen 4th Generation wRfx: NONREACTIVE

## 2024-03-31 MED ORDER — HYDRALAZINE HCL 25 MG PO TABS
25.0000 mg | ORAL_TABLET | Freq: Two times a day (BID) | ORAL | Status: DC
  Administered 2024-03-31: 25 mg via ORAL
  Filled 2024-03-31: qty 1

## 2024-03-31 MED ORDER — FLUTICASONE PROPIONATE 50 MCG/ACT NA SUSP
1.0000 | Freq: Every day | NASAL | Status: DC
  Administered 2024-03-31 – 2024-04-01 (×2): 1 via NASAL
  Filled 2024-03-31: qty 16

## 2024-03-31 MED ORDER — HEPARIN SODIUM (PORCINE) 5000 UNIT/ML IJ SOLN
5000.0000 [IU] | Freq: Three times a day (TID) | INTRAMUSCULAR | Status: DC
  Administered 2024-03-31 – 2024-04-01 (×2): 5000 [IU] via SUBCUTANEOUS
  Filled 2024-03-31 (×2): qty 1

## 2024-03-31 MED ORDER — HYDRALAZINE HCL 10 MG PO TABS
10.0000 mg | ORAL_TABLET | Freq: Once | ORAL | Status: AC
  Administered 2024-03-31: 10 mg via ORAL
  Filled 2024-03-31: qty 1

## 2024-03-31 NOTE — Plan of Care (Signed)

## 2024-03-31 NOTE — Care Management Obs Status (Signed)
 MEDICARE OBSERVATION STATUS NOTIFICATION   Patient Details  Name: [name]  MRN: [id number] Date of Birth: 05/26/1956   Medicare Observation Status Notification Given:  Yes    Cindia Crease, RN 03/31/2024, 8:27 AM

## 2024-03-31 NOTE — Progress Notes (Signed)
 PROGRESS NOTE    [name]   ZOX:[id number] DOB: [birth year] DOA: 03/29/2024 PCP: Alfredia Ina, MD   Brief Narrative: 68 year old with past medical history significant for diabetes type 2, history of ischemic and hemorrhagic CVA, hypertension, hyperlipidemia, history of RCC status post nephrectomy, venous stasis, gout, PVD presents complaining of fatigue.  She has been off of her diabetes medication for a week.  She has been feeling lightheaded and tired.  Reported memory issues.  Evaluation in the ED she was found to have systolic blood pressure 195/92.   Assessment & Plan:   Principal Problem:   Hypertensive emergency Active Problems:   Acute renal failure superimposed on stage 3b chronic kidney disease (HCC)   Type 2 diabetes mellitus with stage 3 chronic kidney disease, without long-term current use of insulin  (HCC)   History of CVA (cerebrovascular accident)   Hyperlipidemia   History of renal cell cancer   1-Hypertensive emergency: - Patient presented with fatigue, weakness lightheadedness, found to have a systolic and diastolic blood pressure 195/92 - He has not been compliant with hydrochlorothiazide  since March 2025. - Started on Norvasc  and hydralazine  Continue currents meds, increased Hydralazine .   AKI on CKD stage IIIb: Creatinine baseline from outside labs 1.21 November 2023 Present with a creatinine of 2.9 Avoid nephrotoxic drugs. Check renal US .  She had Nephrectomy for renal ca.  Monitor renal function.   Diabetes type 2 without long-term use of insulin  Recent A1c 5.19 February 2024 She was on Bydureon , which is not longer available Continue with SSI  History of CVA Both ischemic and hemorrhagic Resume aspirin  She stopped taking a statin did not like how it made her feel.  Encouraged to follow with PCP  Hyperlipidemia -Intolerant to statins.  -  History of renal cell cancer -Status post nephrectomy 2004 by [facility]    Estimated body  mass index is 38.98 kg/m as calculated from the following:   Height as of this encounter: 5\' 2"  (1.575 m).   Weight as of this encounter: 96.7 kg.   DVT prophylaxis: heparin  Code Status: Full code Family Communication: care discussed with patient and family at bedside.  Disposition Plan:  Status is: Observation The patient remains OBS appropriate and will d/c before 2 midnights.    Consultants:  none  Procedures:  US   Antimicrobials:    Subjective: She is doing ok, we talk about her renal function and management of hugh BP.    Objective: Vitals:   03/30/24 1631 03/30/24 2115 03/31/24 0120 03/31/24 0559  BP: (!) 164/79 (!) 147/79 (!) 165/90 (!) 172/90  Pulse: 80 84 94 (!) 154  Resp: 18 18 16 18   Temp: 98.4 F (36.9 C) 98.4 F (36.9 C) 98.7 F (37.1 C) 98.1 F (36.7 C)  TempSrc: Oral Oral Oral Oral  SpO2: 96% 95% 96% 92%  Weight:      Height:       No intake or output data in the 24 hours ending 03/31/24 0756 Filed Weights   03/29/24 1849 03/30/24 1137  Weight: 96.2 kg 96.7 kg    Examination:  General exam: Appears calm and comfortable  Respiratory system: Clear to auscultation. Respiratory effort normal. Cardiovascular system: S1 & S2 heard, RRR. No JVD, murmurs, rubs, gallops or clicks. No pedal edema. Gastrointestinal system: Abdomen is nondistended, soft and nontender. No organomegaly or masses felt. Normal bowel sounds heard. Central nervous system: Alert and oriented. No focal neurological deficits. Extremities: Symmetric 5 x 5 power.   Data Reviewed: I have  personally reviewed following labs and imaging studies  CBC: Recent Labs  Lab 03/29/24 1956 03/31/24 0507  WBC 7.1 6.2  NEUTROABS 3.6  --   HGB 12.1 11.7*  HCT 37.8 36.1  MCV 92.2 89.6  PLT 234 239   Basic Metabolic Panel: Recent Labs  Lab 03/29/24 1956 03/31/24 0507  NA 143 139  K 3.9 4.1  CL 109 109  CO2 24 24  GLUCOSE 93 92  BUN 32* 32*  CREATININE 2.96* 3.02*  CALCIUM   8.9 8.0*   GFR: Estimated Creatinine Clearance: 19.9 mL/min (A) (by C-G formula based on SCr of 3.02 mg/dL (H)). Liver Function Tests: No results for input(s): "AST", "ALT", "ALKPHOS", "BILITOT", "PROT", "ALBUMIN" in the last 168 hours. No results for input(s): "LIPASE", "AMYLASE" in the last 168 hours. No results for input(s): "AMMONIA" in the last 168 hours. Coagulation Profile: No results for input(s): "INR", "PROTIME" in the last 168 hours. Cardiac Enzymes: No results for input(s): "CKTOTAL", "CKMB", "CKMBINDEX", "TROPONINI" in the last 168 hours. BNP (last 3 results) No results for input(s): "PROBNP" in the last 8760 hours. HbA1C: No results for input(s): "HGBA1C" in the last 72 hours. CBG: Recent Labs  Lab 03/29/24 1844 03/30/24 1628 03/30/24 2115  GLUCAP 122* 101* 134*   Lipid Profile: No results for input(s): "CHOL", "HDL", "LDLCALC", "TRIG", "CHOLHDL", "LDLDIRECT" in the last 72 hours. Thyroid  Function Tests: Recent Labs    03/30/24 1652  TSH 1.392   Anemia Panel: Recent Labs    03/30/24 1652  VITAMINB12 373   Sepsis Labs: No results for input(s): "PROCALCITON", "LATICACIDVEN" in the last 168 hours.  No results found for this or any previous visit (from the past 240 hours).       Radiology Studies: No results found.      Scheduled Meds:  amLODipine   10 mg Oral Daily   aspirin  EC  81 mg Oral Daily   feeding supplement  237 mL Oral BID BM   hydrALAZINE   10 mg Oral BID   insulin  aspart  0-9 Units Subcutaneous TID WC   Continuous Infusions:   LOS: 0 days    Time spent: 35 minutes.     Danette Duos, MD [facility]   If 7PM-7AM, please contact night-coverage [email or web address]  03/31/2024, 7:56 AM

## 2024-04-01 ENCOUNTER — Ambulatory Visit: Attending: Family Medicine

## 2024-04-01 ENCOUNTER — Other Ambulatory Visit (HOSPITAL_COMMUNITY): Payer: Self-pay

## 2024-04-01 DIAGNOSIS — I161 Hypertensive emergency: Secondary | ICD-10-CM | POA: Diagnosis not present

## 2024-04-01 LAB — BASIC METABOLIC PANEL WITH GFR
Anion gap: 7 (ref 5–15)
BUN: 36 mg/dL — ABNORMAL HIGH (ref 8–23)
CO2: 24 mmol/L (ref 22–32)
Calcium: 8.3 mg/dL — ABNORMAL LOW (ref 8.9–10.3)
Chloride: 109 mmol/L (ref 98–111)
Creatinine, Ser: 2.93 mg/dL — ABNORMAL HIGH (ref 0.44–1.00)
GFR, Estimated: 17 mL/min — ABNORMAL LOW (ref >60)
Glucose, Bld: 119 mg/dL — ABNORMAL HIGH (ref 70–99)
Potassium: 3.9 mmol/L (ref 3.5–5.1)
Sodium: 140 mmol/L (ref 135–145)

## 2024-04-01 LAB — GLUCOSE, CAPILLARY
Glucose-Capillary: 86 mg/dL (ref 70–99)
Glucose-Capillary: 91 mg/dL (ref 70–99)

## 2024-04-01 MED ORDER — HYDRALAZINE HCL 25 MG PO TABS
25.0000 mg | ORAL_TABLET | Freq: Two times a day (BID) | ORAL | Status: DC
  Administered 2024-04-01 (×2): 25 mg via ORAL
  Filled 2024-04-01 (×2): qty 1

## 2024-04-01 MED ORDER — HYDRALAZINE HCL 25 MG PO TABS
25.0000 mg | ORAL_TABLET | Freq: Three times a day (TID) | ORAL | 2 refills | Status: AC
  Filled 2024-04-01: qty 120, 40d supply, fill #0

## 2024-04-01 MED ORDER — ASPIRIN 81 MG PO TBEC
81.0000 mg | DELAYED_RELEASE_TABLET | Freq: Every day | ORAL | 12 refills | Status: AC
  Filled 2024-04-01: qty 30, 30d supply, fill #0

## 2024-04-01 MED ORDER — AMLODIPINE BESYLATE 10 MG PO TABS
10.0000 mg | ORAL_TABLET | Freq: Every day | ORAL | 2 refills | Status: AC
  Filled 2024-04-01: qty 30, 30d supply, fill #0

## 2024-04-01 MED ORDER — HYDRALAZINE HCL 25 MG PO TABS: 25.0000 mg | ORAL_TABLET | Freq: Three times a day (TID) | ORAL | Status: DC

## 2024-04-01 NOTE — Discharge Summary (Signed)
 Physician Discharge Summary   Patient: [name]  MRN: [id number] DOB: October 08, 1956  Admit date:     03/29/2024  Discharge date: 04/01/24  Discharge Physician: Danette Duos   PCP: Alfredia Ina, MD   Recommendations at discharge:   Needs close follow up with PCP for management of DM Lose follow up with nephrology for lab work, [facility] will help arrange follow up.  Needs further management of HTN  Discharge Diagnoses: Principal Problem:   Hypertensive emergency Active Problems:   Acute renal failure superimposed on stage 3b chronic kidney disease (HCC)   Type 2 diabetes mellitus with stage 3 chronic kidney disease, without long-term current use of insulin  (HCC)   History of CVA (cerebrovascular accident)   Hyperlipidemia   History of renal cell cancer  Resolved Problems:   Anemia, normocytic normochromic  Hospital Course: 68 year old with past medical history significant for diabetes type 2, history of ischemic and hemorrhagic CVA, hypertension, hyperlipidemia, history of RCC status post nephrectomy, venous stasis, gout, PVD presents complaining of fatigue.  She has been off of her diabetes medication for a week.  She has been feeling lightheaded and tired.  Reported memory issues.   Evaluation in the ED she was found to have systolic blood pressure 195/92.    Assessment and Plan: 1-Hypertensive emergency: - Patient presented with fatigue, weakness lightheadedness, found to have a systolic and diastolic blood pressure 195/92 - He has not been compliant with hydrochlorothiazide  since March 2025. - Started on Norvasc  and hydralazine  -BP elevated last night. Plan to change hydralazine  to TID at discharge.     AKI on CKD stage IIIb: Creatinine baseline from outside labs 1.21 November 2023 Present with a creatinine of 2.9--3.0--2.9 Avoid nephrotoxic drugs. Renal US  negative fro hydronephrosis.  She had left  Nephrectomy for renal ca.  Monitor renal function.   Likely hemodynamic in setting of severe HTN. Renal function stable. No uremic symptoms. Report stable urine out put.  Plan to arrange close follow up with nephrology.   Diabetes type 2 without long-term use of insulin  Recent A1c 5.19 February 2024 She was on Bydureon , which is not longer available Continue with SSI   History of CVA Both ischemic and hemorrhagic Resume aspirin  She stopped taking a statin did not like how it made her feel.  Encouraged to follow with PCP   Hyperlipidemia -Intolerant to statins.  -FU with PCP for further recommendations.    History of renal cell cancer -Status post nephrectomy 2004 by [facility]    Obesity type 2; needs life style modifications.    Estimated body mass index is 38.98 kg/m as calculated from the following:   Height as of this encounter: 5\' 2"  (1.575 m).   Weight as of this encounter: 96.7 kg.         Consultants: None Procedures performed: None Disposition: Home Diet recommendation:  Discharge Diet Orders (From admission, onward)     Start     Ordered   04/01/24 0000  Diet - low sodium heart healthy        04/01/24 1254           Carb modified diet DISCHARGE MEDICATION: Allergies as of 04/01/2024   No Known Allergies      Medication List     STOP taking these medications    Bydureon  BCise 2 MG/0.85ML Auij Generic drug: Exenatide  ER   furosemide 20 MG tablet Commonly known as: LASIX   hydrochlorothiazide  12.5 MG tablet Commonly known as: HYDRODIURIL   potassium chloride  10 MEQ tablet Commonly known as: KLOR-CON  M       TAKE these medications    amLODipine  10 MG tablet Commonly known as: NORVASC  Take 1 tablet (10 mg total) by mouth daily. Start taking on: Apr 02, 2024   aspirin  EC 81 MG tablet Take 1 tablet (81 mg total) by mouth daily. Swallow whole. Start taking on: Apr 02, 2024   azelastine  0.1 % nasal spray Commonly known as: ASTELIN  Place 1 spray into both nostrils 2 (two) times  daily as needed for rhinitis or allergies.   hydrALAZINE  25 MG tablet Commonly known as: APRESOLINE  Take 1 tablet (25 mg total) by mouth 3 (three) times daily.   Trulicity 0.75 MG/0.5ML Soaj Generic drug: Dulaglutide Inject 0.75 mg into the skin once a week.        Follow-up Information     Alfredia Ina, MD Follow up in 1 week(s).   Specialty: Family Medicine Contact information: [address] [place] [address] [phone]         [place] Follow up.   Why: office will call you with appointment. Contact information: [address] [place] [address] [phone]                Discharge Exam: Cleavon Curls Weights   03/29/24 1849 03/30/24 1137  Weight: 96.2 kg 96.7 kg   General; NAD  Condition at discharge: stable  The results of significant diagnostics from this hospitalization (including imaging, microbiology, ancillary and laboratory) are listed below for reference.   Imaging Studies: US  RENAL Result Date: 03/31/2024 CLINICAL DATA:  [id number] AKI (acute kidney injury) (HCC) [id number] EXAM: RENAL / URINARY TRACT ULTRASOUND COMPLETE COMPARISON:  September 12, 2021 FINDINGS: Right Kidney: Renal measurements: 11.2 x 6.5 x 5.2 cm = volume: 197 mL. Echogenicity within normal limits. No hydronephrosis visualized. Cyst is noted measuring up to 21 mm (for which no dedicated imaging follow-up is recommended). Left Kidney: Surgically absent Bladder: Nearly completely decompressed and not well visualized. Other: None. IMPRESSION: No hydronephrosis. Electronically Signed   By: Clancy Crimes M.D.   On: 03/31/2024 12:58    Microbiology: Results for orders placed or performed during the hospital encounter of 01/27/23  MRSA Next Gen by PCR, Nasal     Status: None   Collection Time: 01/27/23  9:50 PM   Specimen: Nasal Mucosa; Nasal Swab  Result Value Ref Range Status   MRSA by PCR Next Gen NOT DETECTED NOT DETECTED Final    Comment:  (NOTE) The GeneXpert MRSA Assay (FDA approved for NASAL specimens only), is one component of a comprehensive MRSA colonization surveillance program. It is not intended to diagnose MRSA infection nor to guide or monitor treatment for MRSA infections. Test performance is not FDA approved in patients less than 52 years old. Performed at [facility], [address] [place], [place] [address]     Labs: CBC: Recent Labs  Lab 03/29/24 1956 03/31/24 0507  WBC 7.1 6.2  NEUTROABS 3.6  --   HGB 12.1 11.7*  HCT 37.8 36.1  MCV 92.2 89.6  PLT 234 239   Basic Metabolic Panel: Recent Labs  Lab 03/29/24 1956 03/31/24 0507 04/01/24 0935  NA 143 139 140  K 3.9 4.1 3.9  CL 109 109 109  CO2 24 24 24   GLUCOSE 93 92 119*  BUN 32* 32* 36*  CREATININE 2.96* 3.02* 2.93*  CALCIUM  8.9 8.0* 8.3*   Liver Function Tests: No results  for input(s): "AST", "ALT", "ALKPHOS", "BILITOT", "PROT", "ALBUMIN" in the last 168 hours. CBG: Recent Labs  Lab 03/31/24 1053 03/31/24 1656 03/31/24 2131 04/01/24 0728 04/01/24 1145  GLUCAP 111* 125* 126* 86 91    Discharge time spent: greater than 30 minutes.  Signed: Danette Duos, MD [facility] 04/01/2024

## 2024-04-01 NOTE — Plan of Care (Signed)

## 2024-04-01 NOTE — Therapy (Incomplete)
 OUTPATIENT PHYSICAL THERAPY LOWER EXTREMITY EVALUATION   Patient Name: [name]  MRN: [id number] DOB:08-23-1956, 68 y.o., female Today's Date: 04/01/2024  END OF SESSION:   Past Medical History:  Diagnosis Date   Anemia    Blood transfusion 2010   associated with GI bleeding   CAD (coronary artery disease)    CKD (chronic kidney disease) stage 2, GFR 60-89 ml/min    Depression    Difficult intubation 06/18/2023   Diverticulosis of colon with hemorrhage 2010, 09/2011   DM2 (diabetes mellitus, type 2) (HCC)    Gout 01/30/2023   Left Foot   H/O hiatal hernia    Helicobacter pylori gastritis 09/13/2013   Hemorrhagic stroke (HCC) 2019   Right Basal Ganglia   Hx: UTI (urinary tract infection)    Hypertension    Intraparenchymal hemorrhage of brain (HCC) 01/27/2023   Morbid obesity with BMI of 40.0-44.9, adult (HCC)    Personal history of colonic polyps    adenomas since 2010   PVD (peripheral vascular disease) (HCC)    Renal cell carcinoma 2004   lt kidney removed   Stroke (HCC) 12/2017   05/2021   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   Venous stasis    Past Surgical History:  Procedure Laterality Date   ABDOMINAL HYSTERECTOMY  2004   BIOPSY  10/23/2019   Procedure: BIOPSY;  Surgeon: Annis Kinder, DO;  Location: [facility];  Service: Gastroenterology;;   CHOLECYSTECTOMY     COLONOSCOPY  12/19/2008   diverticulosis   COLONOSCOPY  11/17/2011   Procedure: COLONOSCOPY;  Surgeon: Laurell Pond, MD;  Location: [facility];  Service: Gastroenterology;  Laterality: N/A;   DILATION AND CURETTAGE OF UTERUS N/A 07/11/2014   Procedure: Repair of Vaginal Cuff;  Surgeon: Romilda Coaster, MD;  Location: [facility];  Service: Gynecology;  Laterality: N/A;   ESOPHAGOGASTRODUODENOSCOPY (EGD) WITH PROPOFOL  N/A 10/23/2019   Procedure: ESOPHAGOGASTRODUODENOSCOPY (EGD) WITH PROPOFOL ;  Surgeon: Annis Kinder, DO;  Location: [facility];  Service: Gastroenterology;   Laterality: N/A;   FLEXIBLE SIGMOIDOSCOPY  12/12/2008   diverticulosis   HAND SURGERY     x5 right   HYDRADENITIS EXCISION Right 03/21/2014   Procedure: EXCISION HIDRADENITIS AXILLA;  Surgeon: Azucena Bollard, MD;  Location: [facility];  Service: General;  Laterality: Right;   KNEE ARTHROSCOPY     x2 left   NEPHRECTOMY  2013   left   TRIGGER FINGER RELEASE Left 06/23/2015   Procedure: RELEASE TRIGGER FINGER/A-1 PULLEY LEFT RING FINGER;  Surgeon: Lyanne Sample, MD;  Location: [facility];  Service: Orthopedics;  Laterality: Left;   TUBAL LIGATION  1980   UPPER GASTROINTESTINAL ENDOSCOPY  12/12/2008   gastroporesis   XI ROBOTIC ASSISTED VENTRAL HERNIA N/A 01/21/2022   Procedure: ROBOTIC INCISIONAL HERNIA REPAIR WITH MESH, BILATERAL POSTERIOR RECTUS MYOFASCIAL RELEASE;  Surgeon: Junie Olds, MD;  Location: [facility];  Service: General;  Laterality: N/A;   Patient Active Problem List   Diagnosis Date Noted   Hypertensive emergency 03/30/2024   History of CVA (cerebrovascular accident) 03/30/2024   Acute renal failure superimposed on stage 3b chronic kidney disease (HCC) 03/30/2024   Difficult intubation 06/18/2023   ICH (intracerebral hemorrhage) (HCC) 01/27/2023   Incisional hernia 01/21/2022   CVA (cerebral vascular accident) (HCC) 05/19/2021   Trigger finger of right thumb 07/29/2020   Gastritis and gastroduodenitis 01/31/2020   Hiatal hernia 01/31/2020   Hyperlipidemia    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/2018   CKD (  chronic kidney disease) stage 2, GFR 60-89 ml/min 02/23/2017   Hyperlipidemia associated with type 2 diabetes mellitus (HCC) 07/19/2016   S/p nephrectomy 05/30/2016   IBS (irritable bowel syndrome) 05/30/2016   History of renal cell cancer 05/30/2016   Morbid obesity with BMI of 40.0-44.9, adult (HCC) 05/30/2016   Type 2 diabetes mellitus with stage 3 chronic kidney disease, without long-term current use of insulin   (HCC) 07/28/2015   Benign paroxysmal positional vertigo 10/03/2013   Helicobacter pylori gastritis 09/13/2013   Abdominal cramping 08/16/2013   Back pain 07/31/2013   LUQ pain 07/31/2013   Laceration of hand 07/10/2013   Hidradenitis suppurativa of right axilla 05/31/2013   Renal cell carcinoma (HCC)    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  Rationale for Evaluation and Treatment: Rehabilitation  ONSET DATE: ***  SUBJECTIVE:   SUBJECTIVE STATEMENT: ***  PERTINENT HISTORY: Depression, stroke, HTN PAIN:  Are you having pain? {OPRCPAIN:27236}  PRECAUTIONS: None  RED FLAGS: {PT Red Flags:29287}   WEIGHT BEARING RESTRICTIONS: No  FALLS:  Has patient fallen in last 6 months? {fallsyesno:27318}  LIVING ENVIRONMENT: Lives with: {OPRC lives with:25569::"lives with their family"} Lives in: {Lives in:25570} Stairs: {opstairs:27293} Has following equipment at home: {Assistive devices:23999}  OCCUPATION: ***  PLOF: Independent, Vocation/Vocational requirements: ***, and Leisure: ***  PATIENT GOALS: ***  NEXT MD VISIT: ***  OBJECTIVE:  Note: Objective measures were completed at Evaluation unless otherwise noted.  DIAGNOSTIC FINDINGS: ***  PATIENT SURVEYS:  {rehab surveys:24030}  COGNITION: Overall cognitive status: Within functional limits for tasks assessed     SENSATION: WFL   MUSCLE LENGTH: Hamstrings: Right *** deg; Left *** deg Andy Bannister test: Right *** deg; Left *** deg  POSTURE: {posture:25561}  PALPATION: ***  LOWER EXTREMITY ROM:  {AROM/PROM:27142} ROM Right eval Left eval  Hip flexion    Hip  extension    Hip abduction    Hip adduction    Hip internal rotation    Hip external rotation    Knee flexion    Knee extension    Ankle dorsiflexion    Ankle plantarflexion    Ankle inversion    Ankle eversion     (Blank rows = not tested)  LOWER EXTREMITY MMT:  MMT Right eval Left eval  Hip flexion    Hip extension    Hip abduction    Hip adduction    Hip internal rotation    Hip external rotation    Knee flexion    Knee extension    Ankle dorsiflexion    Ankle plantarflexion    Ankle inversion    Ankle eversion     (Blank rows = not tested)  LOWER EXTREMITY SPECIAL TESTS:  {LEspecialtests:26242}  FUNCTIONAL TESTS:  {Functional tests:24029}  GAIT: Distance walked: *** Assistive device utilized: {Assistive devices:23999} Level of assistance: {Levels of assistance:24026} Comments: ***  TREATMENT DATE:  5/19//25 Findings from evaluation discussed, pt educated on plan of care, HEP initiated.      PATIENT EDUCATION:  Education details: *** Person educated: Patient Education method: Programmer, multimedia, Facilities manager, and Handouts Education comprehension: verbalized understanding and returned demonstration  HOME EXERCISE PROGRAM: ***  ASSESSMENT:  CLINICAL IMPRESSION: Patient is a 68 y.o. female who was seen today for physical therapy evaluation and treatment for balance and falls risk.   OBJECTIVE IMPAIRMENTS: {opptimpairments:25111}.   ACTIVITY LIMITATIONS: {activitylimitations:27494}  PARTICIPATION LIMITATIONS: {participationrestrictions:25113}  PERSONAL FACTORS: {Personal factors:25162} are also affecting patient's functional outcome.   REHAB POTENTIAL: Good  CLINICAL DECISION MAKING: Stable/uncomplicated  EVALUATION COMPLEXITY: {Evaluation complexity:25115}   GOALS: Goals reviewed with patient? Yes  SHORT TERM GOALS:  Target date: 04/09/2024   Be independent in initial HEP Baseline: Goal status: INITIAL  2.  *** Baseline:  Goal status: INITIAL  3.  *** Baseline:  Goal status: INITIAL  4.  *** Baseline:  Goal status: INITIAL  5.  *** Baseline:  Goal status: INITIAL  6.  *** Baseline:  Goal status: INITIAL  LONG TERM GOALS: Target date: 05/07/2024    Be independent in advanced HEP Baseline:  Goal status: INITIAL  2.  *** Baseline:  Goal status: INITIAL  3.  *** Baseline:  Goal status: INITIAL  4.  *** Baseline:  Goal status: INITIAL  5.  *** Baseline:  Goal status: INITIAL  6.  *** Baseline:  Goal status: INITIAL   PLAN:  PT FREQUENCY: 2x/week  PT DURATION: 8 weeks  PLANNED INTERVENTIONS: {rehab planned interventions:25118::"97110-Therapeutic exercises","97530- Therapeutic [phone]- Neuromuscular re-education","97535- Self JXBJ","47829- Manual therapy"}  PLAN FOR NEXT SESSION: Luella Sager, PT 04/01/24 9:56 AM

## 2024-05-02 ENCOUNTER — Ambulatory Visit: Admitting: Physician Assistant

## 2024-11-04 ENCOUNTER — Other Ambulatory Visit (HOSPITAL_COMMUNITY): Payer: Self-pay | Admitting: Nephrology

## 2024-11-04 ENCOUNTER — Telehealth (HOSPITAL_COMMUNITY): Payer: Self-pay | Admitting: Pharmacist

## 2024-11-04 DIAGNOSIS — N1832 Chronic kidney disease, stage 3b: Secondary | ICD-10-CM

## 2024-11-04 DIAGNOSIS — N189 Chronic kidney disease, unspecified: Secondary | ICD-10-CM | POA: Insufficient documentation

## 2024-11-04 NOTE — Telephone Encounter (Signed)
 Received referral for Venofer 250mg . Per Dr. Melia, okay to switch to 300mg  or 200mg  to accommodate available supply at infusion center  Sherry Pennant, PharmD, MPH, BCPS, CPP Clinical Pharmacist

## 2024-11-08 ENCOUNTER — Telehealth (HOSPITAL_COMMUNITY): Payer: Self-pay | Admitting: Pharmacy Technician

## 2024-11-08 NOTE — Telephone Encounter (Signed)
 Auth Submission: NO AUTH NEEDED Site of care: [facility] Payer: [employer] MEDICARE Medication & CPT/J Code(s) submitted: Venofer (Iron Sucrose) J1756 Diagnosis Code: N18.32, D63.1 Route of submission (phone, fax, portal):  Phone # Fax # Auth type: Buy/Bill HB Units/visits requested: 300mg  x 2 doses Reference number: [id number] Approval from: 11/08/2024 to 02/06/25    Dagoberto Armour, CPhT [facility] Phone: [phone] 11/08/2024

## 2024-11-21 ENCOUNTER — Other Ambulatory Visit: Payer: Self-pay | Admitting: *Deleted

## 2024-11-21 ENCOUNTER — Ambulatory Visit (HOSPITAL_COMMUNITY)
Admission: RE | Admit: 2024-11-21 | Discharge: 2024-11-21 | Disposition: A | Discharge status: Home / Self Care | Source: Ambulatory Visit | Attending: Nephrology | Admitting: Nephrology

## 2024-11-21 VITALS — BP 185/102 | HR 88 | Temp 98.3°F | Resp 16

## 2024-11-21 DIAGNOSIS — N1832 Chronic kidney disease, stage 3b: Secondary | ICD-10-CM | POA: Diagnosis present

## 2024-11-21 DIAGNOSIS — D631 Anemia in chronic kidney disease: Secondary | ICD-10-CM | POA: Diagnosis not present

## 2024-11-21 DIAGNOSIS — N186 End stage renal disease: Secondary | ICD-10-CM

## 2024-11-21 MED ORDER — IRON SUCROSE 300 MG IVPB - SIMPLE MED
300.0000 mg | Freq: Once | Status: AC
  Administered 2024-11-21 (×2): 300 mg via INTRAVENOUS

## 2024-11-21 MED ORDER — IRON SUCROSE 300 MG IVPB - SIMPLE MED
Status: AC
  Filled 2024-11-21: qty 265

## 2024-11-27 ENCOUNTER — Ambulatory Visit (HOSPITAL_COMMUNITY)

## 2024-11-27 ENCOUNTER — Ambulatory Visit (HOSPITAL_COMMUNITY): Admitting: Vascular Surgery

## 2024-11-28 ENCOUNTER — Ambulatory Visit (HOSPITAL_COMMUNITY)
Admission: RE | Admit: 2024-11-28 | Discharge: 2024-11-28 | Disposition: A | Source: Ambulatory Visit | Attending: Nephrology

## 2024-11-28 VITALS — BP 173/91 | HR 97 | Temp 97.9°F | Resp 16

## 2024-11-28 DIAGNOSIS — D631 Anemia in chronic kidney disease: Secondary | ICD-10-CM

## 2024-11-28 DIAGNOSIS — N1832 Chronic kidney disease, stage 3b: Secondary | ICD-10-CM | POA: Diagnosis not present

## 2024-11-28 MED ORDER — IRON SUCROSE 300 MG IVPB - SIMPLE MED
Status: AC
  Filled 2024-11-28: qty 265

## 2024-11-28 MED ORDER — IRON SUCROSE 300 MG IVPB - SIMPLE MED
300.0000 mg | Freq: Once | Status: AC
  Administered 2024-11-28: 300 mg via INTRAVENOUS

## 2024-12-13 ENCOUNTER — Encounter: Payer: Self-pay | Admitting: Gastroenterology

## 2024-12-25 ENCOUNTER — Ambulatory Visit: Admitting: Vascular Surgery

## 2024-12-25 ENCOUNTER — Ambulatory Visit (HOSPITAL_COMMUNITY)
